# Patient Record
Sex: Female | Born: 1965 | Race: White | Hispanic: No | Marital: Single | Smoking: Current every day smoker
Health system: Southern US, Community
[De-identification: ages and names within clinical notes are randomized; demographics above are authoritative.]

## PROBLEM LIST (undated history)

## (undated) DIAGNOSIS — Z8701 Personal history of pneumonia (recurrent): Secondary | ICD-10-CM

## (undated) DIAGNOSIS — F17200 Nicotine dependence, unspecified, uncomplicated: Secondary | ICD-10-CM

## (undated) DIAGNOSIS — F329 Major depressive disorder, single episode, unspecified: Secondary | ICD-10-CM

## (undated) DIAGNOSIS — M1990 Unspecified osteoarthritis, unspecified site: Secondary | ICD-10-CM

## (undated) DIAGNOSIS — R0600 Dyspnea, unspecified: Secondary | ICD-10-CM

## (undated) DIAGNOSIS — Z8619 Personal history of other infectious and parasitic diseases: Secondary | ICD-10-CM

## (undated) DIAGNOSIS — F32A Depression, unspecified: Secondary | ICD-10-CM

## (undated) DIAGNOSIS — J189 Pneumonia, unspecified organism: Secondary | ICD-10-CM

## (undated) DIAGNOSIS — F419 Anxiety disorder, unspecified: Secondary | ICD-10-CM

## (undated) DIAGNOSIS — R87610 Atypical squamous cells of undetermined significance on cytologic smear of cervix (ASC-US): Secondary | ICD-10-CM

## (undated) DIAGNOSIS — K219 Gastro-esophageal reflux disease without esophagitis: Secondary | ICD-10-CM

## (undated) DIAGNOSIS — K7010 Alcoholic hepatitis without ascites: Secondary | ICD-10-CM

## (undated) DIAGNOSIS — J449 Chronic obstructive pulmonary disease, unspecified: Secondary | ICD-10-CM

## (undated) DIAGNOSIS — C801 Malignant (primary) neoplasm, unspecified: Secondary | ICD-10-CM

## (undated) DIAGNOSIS — B1920 Unspecified viral hepatitis C without hepatic coma: Secondary | ICD-10-CM

## (undated) DIAGNOSIS — J439 Emphysema, unspecified: Secondary | ICD-10-CM

## (undated) DIAGNOSIS — F319 Bipolar disorder, unspecified: Secondary | ICD-10-CM

## (undated) HISTORY — DX: Personal history of pneumonia (recurrent): Z87.01

## (undated) HISTORY — DX: Personal history of other infectious and parasitic diseases: Z86.19

## (undated) HISTORY — PX: EYE SURGERY: SHX253

## (undated) HISTORY — DX: Atypical squamous cells of undetermined significance on cytologic smear of cervix (ASC-US): R87.610

## (undated) HISTORY — DX: Chronic obstructive pulmonary disease, unspecified: J44.9

## (undated) HISTORY — PX: LOBECTOMY: SHX5089

## (undated) HISTORY — DX: Emphysema, unspecified: J43.9

## (undated) HISTORY — PX: TONSILLECTOMY: SUR1361

## (undated) HISTORY — DX: Unspecified viral hepatitis C without hepatic coma: B19.20

## (undated) HISTORY — DX: Nicotine dependence, unspecified, uncomplicated: F17.200

## (undated) HISTORY — DX: Alcoholic hepatitis without ascites: K70.10

---
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
## 2003-06-05 ENCOUNTER — Emergency Department (HOSPITAL_COMMUNITY): Admission: EM | Admit: 2003-06-05 | Discharge: 2003-06-05 | Payer: Self-pay | Admitting: *Deleted

## 2015-02-11 ENCOUNTER — Encounter: Payer: Self-pay | Admitting: Hematology

## 2015-08-11 ENCOUNTER — Encounter: Payer: Self-pay | Admitting: Hematology

## 2015-08-16 ENCOUNTER — Encounter: Payer: Self-pay | Admitting: Hematology

## 2016-10-16 DIAGNOSIS — Z139 Encounter for screening, unspecified: Secondary | ICD-10-CM

## 2016-10-16 LAB — GLUCOSE, POCT (MANUAL RESULT ENTRY): POC Glucose: 111 mg/dL — AB (ref 70–99)

## 2016-10-16 NOTE — Congregational Nurse Program (Signed)
Congregational Nurse Program Note  Date of Encounter: 10/16/2016  Past Medical History: No past medical history on file.  Encounter Details:     CNP Questionnaire - 10/16/16 1115      Patient Demographics   Is this a new or existing patient? New   Patient is considered a/an Not Applicable   Race Caucasian/White     Patient Assistance   Location of Patient [place]   Patient's financial/insurance status Low Income;Self-Pay (Uninsured)   Uninsured Patient ([place]) Yes   Interventions Counseled to make appt. with provider;Assisted patient in making appt.   Patient referred to apply for the following financial assistance [place] insecurities addressed Not Applicable   Transportation assistance No   Assistance securing medications No   Educational health offerings Behavioral health;Chronic disease;Navigating the healthcare system     Encounter Details   Primary purpose of visit Chronic Illness/Condition Visit;Navigating the Healthcare System   Was an Emergency Department visit averted? Not Applicable   Does patient have a medical provider? No   Patient referred to Area Agency;Clinic;Establish PCP   Was a mental health screening completed? (GAINS tool) No   Does patient have dental issues? No   Does patient have vision issues? No   Does your patient have an abnormal blood pressure today? No   Since previous encounter, have you referred patient for abnormal blood pressure that resulted in a new diagnosis or medication change? No   Does your patient have an abnormal blood glucose today? No   Since previous encounter, have you referred patient for abnormal blood glucose that resulted in a new diagnosis or medication change? No   Was there a life-saving intervention made? No     New Client to [employer]. Lives with her father and has no income and no insurance or medicaid. Client states she has been denied medicaid and is  applying for disability.   Very difficult historian and client anxious to "hurry up" due to her father waiting in the car for her.  Past Medical: Anxiety  Depression Schizophrenia Hepatitis C Asthma COPD Emphysema Seizures CVA? 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 Anxious to leave due to her father waiting who drove her. Client is a current patient at [facility] for her mental health treatment and last saw Dr. Hoyle Barr 10/09/16. She states she goes to group sessions. Denies any current pain. Client very anxious to get done and not wanting to take time to answer many questions. Heart sounds normal, pulse regular, lungs clear bilaterally. Skin does appear icteric. States she has been told she has Hep C , but she has never received treatment and "i want to know if I'm going to die" inquires about being able to get treated. RN discussed that she would be referred first into a primary care provider and they would direct her care to any specialist if needed. Client showed me [facility] discount application that she has filled out she states was given to her at the [facility] along  with my contact information. RN inquired if she has the paperwork needed such as income tax( didn't file), proof of residency, and a letter of support from her father that is notarized, etc. She states she hasn't gotten all of that yet. Encouraged client to do that before turning it in to the hospital. Explained to client why that application is important.  Client states she understands. Client denies thoughts of suicide and has another appointment with Dr Hoyle Barr in October 2018. She states her last medical appointment was in April in [place]. She again states she is almost out of her heart pill.  Encouraged client to take her medications even if bottles empty to her appointment.  Choices for medical care discussed and [facility] to [facility] to where she lives. Client request calling the free clinic to see when she can get an appointment and then she will have to ask her father and will call me back to let me know if she can come. Tentatively scheduled client for 10/23/16 at 1:15 pm. RN contact information given to client so she can call back today to inform RN if she can get to that appointment.  Will follow as needed.

## 2016-10-17 ENCOUNTER — Telehealth: Payer: Self-pay

## 2016-10-17 NOTE — Telephone Encounter (Signed)
Client had been seen earlier 10/16/16 at [facility] and client had tentatively been referred into the free clinic on 10/23/16. Client was to call to confirm. RN had not heard from client so called to confirm that she would indeed take that appointment at the [facility]. Client states "its too much trouble to be seen and get paperwork done to be seen for my Hep C, so I'm going back to [place] to be seen and treated". RN again confirmed that client no longer wanted to go to [facility] and client again states she is going back to [place] to be seen medically.  RN will call [facility] and cancel that appointment. Free clinic called and appointment canceled , spoke with Vincente Liberty.

## 2016-10-23 ENCOUNTER — Ambulatory Visit: Payer: Self-pay | Admitting: Physician Assistant

## 2017-02-01 ENCOUNTER — Encounter: Payer: Self-pay | Admitting: Hematology

## 2017-05-07 ENCOUNTER — Encounter: Payer: Self-pay | Admitting: Hematology

## 2017-05-24 ENCOUNTER — Encounter (HOSPITAL_COMMUNITY): Payer: Self-pay | Admitting: Hematology

## 2017-05-25 ENCOUNTER — Inpatient Hospital Stay (HOSPITAL_COMMUNITY): Payer: Medicaid Other | Attending: Hematology | Admitting: Hematology

## 2017-05-25 ENCOUNTER — Inpatient Hospital Stay (HOSPITAL_COMMUNITY): Payer: Medicaid Other

## 2017-05-25 ENCOUNTER — Encounter (HOSPITAL_COMMUNITY): Payer: Self-pay | Admitting: Hematology

## 2017-05-25 ENCOUNTER — Other Ambulatory Visit: Payer: Self-pay

## 2017-05-25 VITALS — BP 108/62 | HR 70 | Temp 98.2°F | Resp 18 | Wt 156.4 lb

## 2017-05-25 DIAGNOSIS — B1920 Unspecified viral hepatitis C without hepatic coma: Secondary | ICD-10-CM | POA: Diagnosis not present

## 2017-05-25 DIAGNOSIS — Z8701 Personal history of pneumonia (recurrent): Secondary | ICD-10-CM | POA: Diagnosis not present

## 2017-05-25 DIAGNOSIS — F3160 Bipolar disorder, current episode mixed, unspecified: Secondary | ICD-10-CM | POA: Diagnosis not present

## 2017-05-25 DIAGNOSIS — J449 Chronic obstructive pulmonary disease, unspecified: Secondary | ICD-10-CM | POA: Insufficient documentation

## 2017-05-25 DIAGNOSIS — K7010 Alcoholic hepatitis without ascites: Secondary | ICD-10-CM | POA: Diagnosis not present

## 2017-05-25 DIAGNOSIS — E039 Hypothyroidism, unspecified: Secondary | ICD-10-CM | POA: Insufficient documentation

## 2017-05-25 DIAGNOSIS — F1020 Alcohol dependence, uncomplicated: Secondary | ICD-10-CM | POA: Insufficient documentation

## 2017-05-25 DIAGNOSIS — D696 Thrombocytopenia, unspecified: Secondary | ICD-10-CM | POA: Insufficient documentation

## 2017-05-25 LAB — CBC WITH DIFFERENTIAL/PLATELET
Basophils Absolute: 0 10*3/uL (ref 0.0–0.1)
Basophils Relative: 0 %
Eosinophils Absolute: 0.1 10*3/uL (ref 0.0–0.7)
Eosinophils Relative: 2 %
HEMATOCRIT: 34.3 % — AB (ref 36.0–46.0)
Hemoglobin: 11.6 g/dL — ABNORMAL LOW (ref 12.0–15.0)
LYMPHS ABS: 1.8 10*3/uL (ref 0.7–4.0)
LYMPHS PCT: 46 %
MCH: 33.7 pg (ref 26.0–34.0)
MCHC: 33.8 g/dL (ref 30.0–36.0)
MCV: 99.7 fL (ref 78.0–100.0)
MONO ABS: 0.7 10*3/uL (ref 0.1–1.0)
Monocytes Relative: 18 %
NEUTROS ABS: 1.3 10*3/uL — AB (ref 1.7–7.7)
Neutrophils Relative %: 34 %
PLATELETS: 33 10*3/uL — AB (ref 150–400)
RBC: 3.44 MIL/uL — AB (ref 3.87–5.11)
RDW: 14.8 % (ref 11.5–15.5)
WBC: 3.9 10*3/uL — ABNORMAL LOW (ref 4.0–10.5)

## 2017-05-25 LAB — RAPID HIV SCREEN (HIV 1/2 AB+AG)
HIV 1/2 ANTIBODIES: NONREACTIVE
HIV-1 P24 Antigen - HIV24: NONREACTIVE

## 2017-05-25 LAB — FOLATE: Folate: 13.7 ng/mL (ref >5.9)

## 2017-05-25 LAB — LACTATE DEHYDROGENASE: LDH: 153 U/L (ref 98–192)

## 2017-05-25 LAB — VITAMIN B12: VITAMIN B 12: 996 pg/mL — AB (ref 180–914)

## 2017-05-25 NOTE — Assessment & Plan Note (Signed)
1.  Thrombocytopenia: -Recent blood work at Dr. Juel Burrow office from April 2019 shows platelet count of 49.  I have reviewed medical records from [place], her platelet count was 02/26/2015.  She has slight easy bruising but denies any active bleeding.  She reports that she was given platelet transfusion when she was in [place] hospital for pneumonia in February.  Again denies any active bleeding during that hospitalization.  Likely etiology for her thrombocytopenia is active hepatitis C which can cause thrombocytopenia by direct bone marrow suppression of megakaryocytes and by causing splenomegaly from liver disease.  I have reviewed CT scans from 2017 from [place] which shows spleen is in the upper limit of normal size along with fatty liver and hepatomegaly.  We will obtain an ultrasound to document the size of spleen.  We will repeat her platelet count today, review smear.  We will complete work-up for thrombocytopenia by checking for connective tissue disorders, HIV test and evaluate nutritional deficiency by checking J15 and folic acid.  We will see her back in 2 to 3 weeks to discuss the results.  2.  Recurrent pneumonias: She reports hospitalization with recurrent pneumonias in the last 3 years.  Hence we will check quantitative immunoglobulins.

## 2017-05-25 NOTE — Patient Instructions (Addendum)
[place] at [facility] Discharge Instructions  Today you saw Dr. Marcia Brash will have labs today.   Ultrasound to be scheduled   Return to see Dr. Delton Coombes in 2 weeks   Thank you for choosing [facility] at [facility] to provide your oncology and hematology care.  To afford each patient quality time with our provider, please arrive at least 15 minutes before your scheduled appointment time.   If you have a lab appointment with the [facility] please come in thru the  Main Entrance and check in at the main information desk  You need to re-schedule your appointment should you arrive 10 or more minutes late.  We strive to give you quality time with our providers, and arriving late affects you and other patients whose appointments are after yours.  Also, if you no show three or more times for appointments you may be dismissed from the clinic at the providers discretion.     Again, thank you for choosing [facility].  Our hope is that these requests will decrease the amount of time that you wait before being seen by our physicians.       _____________________________________________________________  Should you have questions after your visit to [facility], please contact our office at [phone] between the hours of 8:30 a.m. and 4:30 p.m.  Voicemails left after 4:30 p.m. will not be returned until the following business day.  For prescription refill requests, have your pharmacy contact our office.       Resources For Cancer Patients and their Caregivers ? American Cancer Society: Can assist with transportation, wigs, general needs, runs Look Good Feel Better.        661-699-0687 ? Cancer Care: Provides financial assistance, online support groups, medication/co-pay assistance.  1-800-813-HOPE 4127313460) ? [place] Assists [place] cancer patients and their families  through emotional , educational and financial support.  920-155-4776 ? [place] DSS Where to apply for food stamps, Medicaid and utility assistance. (313)818-4093 ? [facility]: Transportation to medical appointments. (434) 737-0044 ? Social Security Administration: May apply for disability if have a Stage IV cancer. 775-162-9775 (867) 825-9132 ? [employer], Disability and Transit Services: Assists with nutrition, care and transit needs. [place] Support Programs:   > Cancer Support Group  2nd Tuesday of the month 1pm-2pm, Journey Room   > Creative Journey  3rd Tuesday of the month 1130am-1pm, Journey Room

## 2017-05-25 NOTE — Progress Notes (Signed)
[facility] CONSULT NOTE  Patient Care Team: Celene Squibb, MD as PCP - General (Internal Medicine)  CHIEF COMPLAINTS/PURPOSE OF CONSULTATION:  Thrombocytopenia   HISTORY OF PRESENTING ILLNESS:  Rachel Gross 52 y.o. female is here because of thrombocytopenia, referred by her PCP, Dr. Delphina Cahill.  Patient recently moved from [place], [place] where she was homeless and living in a tent.  Currently lives with her father in [place] at this time.   Platelets noted to be 49,000 recent office visit with 05/07/17.  AST/ALT also elevated at that time at 183 and 87.   Past medical history is significant for asthma, hypothyroidism, COPD, emphysema, pneumonia, alcoholism, alcoholic hepatitis, hepatitis C, and bipolar disorder (mixed).  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  Labs from [place], [place] reviewed. Date of labs was 08/11/15 and platelet count 125,000 at that time.   CT abd/pelvis from 08/17/15 noted acute diverticulitis involving descending colon & rectosigmoid colon, fatty infiltration of liver and hepatomegaly, small mount of free fluid adjacent to gallbladder.    INTERVAL HISTORY:  Rachel Gross 52 y.o. female presents today for thrombocytopenia.   Here today with her father.   She has no energy. "I feel tired all the time. I can't do nothing."  She was hospitalized in [place] in February 2019 for pneumonia; she was reportedly hospitalized for 2 weeks.  Reports requiring 1 platelet transfusion in [place] about 3 years ago; she was in the hospital "and my blood was low."  She has been hospitalized 3 different times for pneumonia in the past.    Endorses easy bleeding. Denies nose bleeds, blood in stools or urine.  Denies headaches or fever changes. Denies hematemesis or rectal bleeding. Reports good appetite. Denies N&V, constipation, and diarrhea.    Denies family history of  blood disorders that they are aware of. Denies any family history of cancer.    She is aware that she has hepatitis C; states that she has not had treatment for it.  She is not aware of anyone telling her that her spleen has ever been enlarged.    Denies any current alcohol use; last drink was ~3 weeks ago per her father.  There is a bit of discrepancy with when she moved to [facility] and when her last alcoholic drink was.  Patient states that she has not had anything to drink since she moved to [place] a few months ago. Patient states that she has been in [place] for longer than 3 weeks.      MEDICAL HISTORY:  Past Medical History:  Diagnosis Date  . Alcoholic hepatitis without ascites   . 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 COPD (chronic obstructive pulmonary disease) ([place])   . Emphysema lung ([place])   . History of HPV infection   . History of pneumonia   . Smoker     SURGICAL HISTORY: History reviewed. No pertinent surgical history.  SOCIAL HISTORY: Social History   Socioeconomic History  . Marital status: Single    Spouse name: Not on file  . Number of children: Not on file  . Years of education: Not on file  . Highest education level: Not on file  Occupational History  . Not on file  Social Needs  . Financial resource strain: Not on file  . Food insecurity:    Worry: Not on file    Inability: Not on file  .  Transportation needs:    Medical: Not on file    Non-medical: Not on file  Tobacco Use  . Smoking status: Not on file  Substance and Sexual Activity  . Alcohol use: Not on file  . Drug use: Not on file  . Sexual activity: Not on file  Lifestyle  . Physical activity:    Days per week: Not on file    Minutes per session: Not on file  . Stress: Not on file  Relationships  . Social connections:    Talks on phone: Not on file    Gets together: Not on file    Attends religious service: Not on file    Active member of club or organization:  Not on file    Attends meetings of clubs or organizations: Not on file    Relationship status: Not on file  . Intimate partner violence:    Fear of current or ex partner: Not on file    Emotionally abused: Not on file    Physically abused: Not on file    Forced sexual activity: Not on file  Other Topics Concern  . Not on file  Social History Narrative  . Not on file    FAMILY HISTORY: Family History  Problem Relation Age of Onset  . Congenital heart disease Mother   . Prostate cancer Brother   . Alzheimer's disease Paternal Grandmother     ALLERGIES:  is allergic to fish allergy.  MEDICATIONS:  Current Outpatient Medications  Medication Sig Dispense Refill  . divalproex (DEPAKOTE) 500 MG DR tablet Take 500 mg by mouth 2 (two) times daily.    Marland Kitchen levothyroxine (SYNTHROID, LEVOTHROID) 25 MCG tablet Take 25 mcg by mouth daily before breakfast.    . metoprolol succinate (TOPROL-XL) 25 MG 24 hr tablet Take 25 mg by mouth daily.    . risperiDONE (RISPERDAL) 2 MG tablet Take 2 mg by mouth at bedtime.     No current facility-administered medications for this visit.     REVIEW OF SYSTEMS:   Constitutional: Denies fevers, chills or abnormal night sweats.  Complains of feeling generalized fatigue. Eyes: Denies blurriness of vision, double vision or watery eyes Ears, nose, mouth, throat, and face: Denies mucositis or sore throat Respiratory: Denies cough, dyspnea or wheezes Cardiovascular: Denies palpitation, chest discomfort or lower extremity swelling Gastrointestinal:  Denies nausea, heartburn or change in bowel habits Skin: Denies abnormal skin rashes Lymphatics: Denies new lymphadenopathy or easy bruising Neurological:Denies numbness, tingling or new weaknesses Behavioral/Psych: Mood is stable, no new changes  All other systems were reviewed with the patient and are negative.  PHYSICAL EXAMINATION: ECOG PERFORMANCE STATUS: 1 - Symptomatic but completely ambulatory  Vitals:    05/25/17 0935  BP: 108/62  Pulse: 70  Resp: 18  Temp: 98.2 F (36.8 C)  SpO2: 98%   Filed Weights   05/25/17 0935  Weight: 156 lb 6.4 oz (70.9 kg)    GENERAL:alert, no distress and comfortable SKIN: skin color, texture, turgor are normal, no rashes or significant lesions EYES: normal, conjunctiva are pink and non-injected, sclera clear OROPHARYNX:no exudate, no erythema and lips, buccal mucosa, and tongue normal.  No ecchymosis. NECK: supple, thyroid normal size, non-tender, without nodularity LYMPH:  no palpable lymphadenopathy in the cervical, axillary or inguinal LUNGS: clear to auscultation and percussion with normal breathing effort HEART: regular rate & rhythm and no murmurs and no lower extremity edema ABDOMEN:abdomen soft, non-tender and normal bowel sounds.  No clearly palpable hepatosplenomegaly although there is  vague tenderness in the right upper quadrant. Musculoskeletal:no cyanosis of digits and no clubbing  PSYCH: alert & oriented x 3 with fluent speech NEURO: no focal motor/sensory deficits  LABORATORY DATA:  I have reviewed the data as listed from Dr. Juel Burrow office.  RADIOGRAPHIC STUDIES: I have reviewed reports from scans from [place] in July 2017.  ASSESSMENT & PLAN:  Thrombocytopenia ([place]) 1.  Thrombocytopenia: -Recent blood work at Dr. Juel Burrow office from April 2019 shows platelet count of 49.  I have reviewed medical records from [place], her platelet count was 02/26/2015.  She has slight easy bruising but denies any active bleeding.  She reports that she was given platelet transfusion when she was in [place] hospital for pneumonia in February.  Again denies any active bleeding during that hospitalization.  Likely etiology for her thrombocytopenia is active hepatitis C which can cause thrombocytopenia by direct bone marrow suppression of megakaryocytes and by causing splenomegaly from liver disease.  I have reviewed CT scans from 2017 from [place] which shows  spleen is in the upper limit of normal size along with fatty liver and hepatomegaly.  We will obtain an ultrasound to document the size of spleen.  We will repeat her platelet count today, review smear.  We will complete work-up for thrombocytopenia by checking for connective tissue disorders, HIV test and evaluate nutritional deficiency by checking C58 and folic acid.  We will see her back in 2 to 3 weeks to discuss the results.  2.  Recurrent pneumonias: She reports hospitalization with recurrent pneumonias in the last 3 years.  Hence we will check quantitative immunoglobulins.     All questions were answered. The patient knows to call the clinic with any problems, questions or concerns.     This note includes documentation from Mike Craze, NP, who was present during this patient's office visit and evaluation.  I have reviewed this note for its completeness and accuracy.  I have edited this note accordingly based on my findings and medical opinion.      Orders placed this encounter:  Orders Placed This Encounter  Procedures  . US Abdomen Complete    Standing Status:   Future    Standing Expiration Date:   05/26/2018    Order Specific Question:   Reason for Exam (SYMPTOM  OR DIAGNOSIS REQUIRED)    Answer:   thrombocytopenia; known h/o hep C    Order Specific Question:   Preferred imaging location?    Answer:   [facility]  . CBC with Differential/Platelet    Standing Status:   Future    Standing Expiration Date:   05/26/2018  . Lactate dehydrogenase    Standing Status:   Future    Standing Expiration Date:   05/26/2018  . IgG, IgA, IgM    Standing Status:   Future    Standing Expiration Date:   05/26/2018  . Rapid HIV screen (HIV 1/2 Ab+Ag)    Standing Status:   Future    Standing Expiration Date:   05/26/2018  . Lupus anticoagulant panel    Standing Status:   Future    Standing Expiration Date:   05/26/2018  . Pathologist smear review    Standing Status:   Future     Standing Expiration Date:   05/26/2018  . ANA, IFA (with reflex)    Standing Status:   Future    Standing Expiration Date:   05/26/2018  . Folate    Standing Status:   Future  Standing Expiration Date:   05/25/2018  . Vitamin B12    Standing Status:   Future    Standing Expiration Date:   05/26/2018      Derek Jack, MD 05/25/17 10:25 AM

## 2017-05-25 NOTE — Addendum Note (Signed)
Addended by: Boneta Lucks L on: 05/25/2017 10:38 AM   Modules accepted: Orders

## 2017-05-26 LAB — LUPUS ANTICOAGULANT PANEL
DRVVT: 28.1 s (ref 0.0–47.0)
PTT Lupus Anticoagulant: 35.5 s (ref 0.0–51.9)

## 2017-05-26 LAB — IGG, IGA, IGM
IGG (IMMUNOGLOBIN G), SERUM: 2114 mg/dL — AB (ref 700–1600)
IGM (IMMUNOGLOBULIN M), SRM: 367 mg/dL — AB (ref 26–217)
IgA: 403 mg/dL — ABNORMAL HIGH (ref 87–352)

## 2017-05-28 LAB — PATHOLOGIST SMEAR REVIEW

## 2017-05-29 LAB — ANTINUCLEAR ANTIBODIES, IFA: ANTINUCLEAR ANTIBODIES, IFA: NEGATIVE

## 2017-05-31 ENCOUNTER — Ambulatory Visit (HOSPITAL_COMMUNITY)
Admission: RE | Admit: 2017-05-31 | Discharge: 2017-05-31 | Disposition: A | Discharge status: Home / Self Care | Payer: Medicaid Other | Source: Ambulatory Visit | Attending: Adult Health | Admitting: Adult Health

## 2017-05-31 DIAGNOSIS — D696 Thrombocytopenia, unspecified: Secondary | ICD-10-CM | POA: Diagnosis present

## 2017-05-31 DIAGNOSIS — R932 Abnormal findings on diagnostic imaging of liver and biliary tract: Secondary | ICD-10-CM | POA: Diagnosis not present

## 2017-06-12 NOTE — Progress Notes (Signed)
[place] FOLLOW-UP NOTE  Patient Care Team: Celene Squibb, MD as PCP - General (Internal Medicine)  CHIEF COMPLAINTS:  Thrombocytopenia   HISTORY OF PRESENTING ILLNESS:  Rachel Gross 52 y.o. female is here because of thrombocytopenia, referred by her PCP, Dr. Delphina Cahill.  Patient recently moved from [place], [place] where she was homeless and living in a tent.  Currently lives with her father in [place] at this time.   Platelets noted to be 49,000 recent office visit with 05/07/17.  AST/ALT also elevated at that time at 183 and 87.   Past medical history is significant for asthma, hypothyroidism, COPD, emphysema, pneumonia, alcoholism, alcoholic hepatitis, hepatitis C, and bipolar disorder (mixed).  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  Labs from [place], [place] reviewed. Date of labs was 08/11/15 and platelet count 125,000 at that time.   CT abd/pelvis from 08/17/15 noted acute diverticulitis involving descending colon & rectosigmoid colon, fatty infiltration of liver and hepatomegaly, small mount of free fluid adjacent to gallbladder.    INTERVAL HISTORY:  Rachel Gross 52 y.o. female presents today for follow-up for thrombocytopenia.    Nursing staff note that she has bilateral tremors, was drooling, and had some ataxia when being roomed in exam room this morning.   Here today with her father.   Reports feeling tired all the time. Denies any easy bruising or bleeding.  She feels dizzy. States that she has swelling in her ankles.  Her hand tremors are chronic and stable per her report.    Labs reviewed in detail. US abdomen reviewed and spleen was normal.  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  Most recent platelet count 33,000 from labs on 05/25/17.   Recommend 4-days of high-dose Decadron and return in 2 weeks to recheck labs.       MEDICAL HISTORY:  Past Medical  History:  Diagnosis Date  . Alcoholic hepatitis without ascites   . 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 COPD (chronic obstructive pulmonary disease) ([facility])   . Emphysema lung ([facility])   . History of HPV infection   . History of pneumonia   . Smoker     SURGICAL HISTORY: History reviewed. No pertinent surgical history.  SOCIAL HISTORY: Social History   Socioeconomic History  . Marital status: Single    Spouse name: Not on file  . Number of children: Not on file  . Years of education: Not on file  . Highest education level: Not on file  Occupational History  . Not on file  Social Needs  . Financial resource strain: Not on file  . Food insecurity:    Worry: Not on file    Inability: Not on file  . Transportation needs:    Medical: Not on file    Non-medical: Not on file  Tobacco Use  . Smoking status: Not on file  Substance and Sexual Activity  . Alcohol use: Not on file  . Drug use: Not on file  . Sexual activity: Not on file  Lifestyle  . Physical activity:    Days per week: Not on file    Minutes per session: Not on file  . Stress: Not on file  Relationships  . Social connections:    Talks on phone: Not on file    Gets together: Not on file    Attends religious service: Not on file    Active member  of club or organization: Not on file    Attends meetings of clubs or organizations: Not on file    Relationship status: Not on file  . Intimate partner violence:    Fear of current or ex partner: Not on file    Emotionally abused: Not on file    Physically abused: Not on file    Forced sexual activity: Not on file  Other Topics Concern  . Not on file  Social History Narrative  . Not on file    FAMILY HISTORY: Family History  Problem Relation Age of Onset  . Congenital heart disease Mother   . Prostate cancer Brother   . Alzheimer's disease Paternal Grandmother     ALLERGIES:  is allergic to fish allergy.  MEDICATIONS:   Current Outpatient Medications  Medication Sig Dispense Refill  . divalproex (DEPAKOTE) 500 MG DR tablet Take 500 mg by mouth 2 (two) times daily.    Marland Kitchen levothyroxine (SYNTHROID, LEVOTHROID) 25 MCG tablet Take 25 mcg by mouth daily before breakfast.    . metoprolol succinate (TOPROL-XL) 25 MG 24 hr tablet Take 25 mg by mouth daily.    . risperiDONE (RISPERDAL) 2 MG tablet Take 2 mg by mouth at bedtime.    Marland Kitchen dexamethasone (DECADRON) 4 MG tablet Take 10 tablets (40 mg total) by mouth every morning for 4 days. 40 tablet 0   No current facility-administered medications for this visit.     REVIEW OF SYSTEMS:   Constitutional: Denies fevers, chills or abnormal night sweats.  Complains of feeling generalized fatigue. Eyes: Denies blurriness of vision, double vision or watery eyes Ears, nose, mouth, throat, and face: Denies mucositis or sore throat Respiratory: Denies  dyspnea or wheezes.  Occasional cough. Cardiovascular: Denies palpitation, chest discomfort or lower extremity swelling Gastrointestinal:  Denies nausea, heartburn or change in bowel habits Skin: Denies abnormal skin rashes Lymphatics: Denies new lymphadenopathy or easy bruising Neurological:Denies numbness, tingling or new weaknesses Behavioral/Psych: Mood is stable, no new changes  All other systems were reviewed with the patient and are negative.  PHYSICAL EXAMINATION: ECOG PERFORMANCE STATUS: 1 - Symptomatic but completely ambulatory  Vitals:   06/13/17 0937  BP: (!) 101/41  Pulse: 66  Resp: 16  SpO2: 99%   Filed Weights   06/13/17 0937  Weight: 153 lb (69.4 kg)    GENERAL:alert, no distress and comfortable SKIN: skin color, texture, turgor are normal, no rashes or significant lesions   LABORATORY DATA:  I have reviewed the data as listed from Dr. Juel Burrow office.  RADIOGRAPHIC STUDIES: I have reviewed reports from scans from [place] in July 2017.  ASSESSMENT & PLAN:   .  Thrombocytopenia: -Recent blood  work at Dr. Juel Burrow office from April 2019 shows platelet count of 49.  I have reviewed medical records from [place], her platelet count was 127.  We have done work-up for thrombocytopenia.  Her platelet count dropped to 33.  Lupus anticoagulant and HIV test were negative. -Ultrasound of the abdomen on 05/31/2017 shows hepatic cirrhosis, normal sized spleen. 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  We will give her a trial of pulse dose dexamethasone 40 mg daily for 4 days.  I plan to check her platelet count in 10 to 14 days.  We discussed the side effects of high-dose steroids.  2.  Recurrent pneumonias: She reports hospitalization with recurrent pneumonias in the last 3 years.  I have checked her immunoglobulins which are normal.    All questions were answered. The patient  knows to call the clinic with any problems, questions or concerns.     This note includes documentation from Mike Craze, NP, who was present during this patient's office visit and evaluation.  I have reviewed this note for its completeness and accuracy.  I have edited this note accordingly based on my findings and medical opinion.      Orders placed this encounter:  Orders Placed This Encounter  Procedures  . CBC with Differential/Platelet    Standing Status:   Future    Standing Expiration Date:   06/14/2018      Derek Jack, MD 06/13/17 3:01 PM

## 2017-06-13 ENCOUNTER — Inpatient Hospital Stay (HOSPITAL_COMMUNITY): Payer: Medicaid Other | Attending: Hematology | Admitting: Hematology

## 2017-06-13 ENCOUNTER — Other Ambulatory Visit: Payer: Self-pay

## 2017-06-13 ENCOUNTER — Encounter (HOSPITAL_COMMUNITY): Payer: Self-pay | Admitting: Hematology

## 2017-06-13 VITALS — BP 101/41 | HR 66 | Resp 16 | Wt 153.0 lb

## 2017-06-13 DIAGNOSIS — E039 Hypothyroidism, unspecified: Secondary | ICD-10-CM | POA: Diagnosis not present

## 2017-06-13 DIAGNOSIS — K7460 Unspecified cirrhosis of liver: Secondary | ICD-10-CM | POA: Insufficient documentation

## 2017-06-13 DIAGNOSIS — D693 Immune thrombocytopenic purpura: Secondary | ICD-10-CM | POA: Diagnosis not present

## 2017-06-13 DIAGNOSIS — D696 Thrombocytopenia, unspecified: Secondary | ICD-10-CM

## 2017-06-13 MED ORDER — DEXAMETHASONE 4 MG PO TABS: 40.0000 mg | ORAL_TABLET | Freq: Every morning | ORAL | 0 refills | Status: AC

## 2017-06-13 NOTE — Assessment & Plan Note (Signed)
1.  Thrombocytopenia: -Recent blood work at Dr. Juel Burrow office from April 2019 shows platelet count of 49.  I have reviewed medical records from [place], her platelet count was 02/26/2015.  She has slight easy bruising but denies any active bleeding.  She reports that she was given platelet transfusion when she was in [place] hospital for pneumonia in February.  Again denies any active bleeding during that hospitalization.  Likely etiology for her thrombocytopenia is active hepatitis C which can cause thrombocytopenia by direct bone marrow suppression of megakaryocytes and by causing splenomegaly from liver disease.  I have reviewed CT scans from 2017 from [place] which shows spleen is in the upper limit of normal size along with fatty liver and hepatomegaly.  We will obtain an ultrasound to document the size of spleen.  We will repeat her platelet count today, review smear.  We will complete work-up for thrombocytopenia by checking for connective tissue disorders, HIV test and evaluate nutritional deficiency by checking P94 and folic acid.  We will see her back in 2 to 3 weeks to discuss the results.  2.  Recurrent pneumonias: She reports hospitalization with recurrent pneumonias in the last 3 years.  Hence we will check quantitative immunoglobulins.

## 2017-06-13 NOTE — Assessment & Plan Note (Signed)
1.  Thrombocytopenia: -Recent blood work at Dr. Juel Burrow office from April 2019 shows platelet count of 49.  I have reviewed medical records from [place], her platelet count was 127.  We have done work-up for thrombocytopenia.  Her platelet count dropped to 33.  Lupus anticoagulant and HIV test were negative. -Ultrasound of the abdomen on 05/31/2017 shows hepatic cirrhosis, normal sized spleen. 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  We will give her a trial of pulse dose dexamethasone 40 mg daily for 4 days.  I plan to check her platelet count in 10 to 14 days.  We discussed the side effects of high-dose steroids.  2.  Recurrent pneumonias: She reports hospitalization with recurrent pneumonias in the last 3 years.  I have checked her immunoglobulins which are normal.  3.  Hepatitis C: We will make a referral to hepatology for treatment of hep C.

## 2017-06-20 ENCOUNTER — Encounter: Payer: Self-pay | Admitting: Internal Medicine

## 2017-06-27 ENCOUNTER — Inpatient Hospital Stay (HOSPITAL_COMMUNITY): Payer: Medicaid Other

## 2017-06-27 DIAGNOSIS — D696 Thrombocytopenia, unspecified: Secondary | ICD-10-CM

## 2017-06-27 DIAGNOSIS — D693 Immune thrombocytopenic purpura: Secondary | ICD-10-CM | POA: Diagnosis not present

## 2017-06-27 LAB — CBC WITH DIFFERENTIAL/PLATELET
BASOS ABS: 0 10*3/uL (ref 0.0–0.1)
BASOS PCT: 0 %
EOS ABS: 0 10*3/uL (ref 0.0–0.7)
Eosinophils Relative: 1 %
HCT: 39.1 % (ref 36.0–46.0)
HEMOGLOBIN: 13 g/dL (ref 12.0–15.0)
Lymphocytes Relative: 42 %
Lymphs Abs: 2.2 10*3/uL (ref 0.7–4.0)
MCH: 34.4 pg — ABNORMAL HIGH (ref 26.0–34.0)
MCHC: 33.2 g/dL (ref 30.0–36.0)
MCV: 103.4 fL — ABNORMAL HIGH (ref 78.0–100.0)
MONOS PCT: 15 %
Monocytes Absolute: 0.7 10*3/uL (ref 0.1–1.0)
NEUTROS ABS: 2.1 10*3/uL (ref 1.7–7.7)
NEUTROS PCT: 42 %
Platelets: 81 10*3/uL — ABNORMAL LOW (ref 150–400)
RBC: 3.78 MIL/uL — ABNORMAL LOW (ref 3.87–5.11)
RDW: 15.8 % — AB (ref 11.5–15.5)
WBC: 5 10*3/uL (ref 4.0–10.5)

## 2017-06-28 ENCOUNTER — Encounter (HOSPITAL_COMMUNITY): Payer: Self-pay | Admitting: Hematology

## 2017-06-28 ENCOUNTER — Inpatient Hospital Stay (HOSPITAL_BASED_OUTPATIENT_CLINIC_OR_DEPARTMENT_OTHER): Payer: Medicaid Other | Admitting: Hematology

## 2017-06-28 ENCOUNTER — Other Ambulatory Visit: Payer: Self-pay

## 2017-06-28 VITALS — BP 135/103 | HR 80 | Temp 98.6°F | Resp 18 | Wt 153.1 lb

## 2017-06-28 DIAGNOSIS — K7460 Unspecified cirrhosis of liver: Secondary | ICD-10-CM

## 2017-06-28 DIAGNOSIS — D693 Immune thrombocytopenic purpura: Secondary | ICD-10-CM

## 2017-06-28 DIAGNOSIS — E039 Hypothyroidism, unspecified: Secondary | ICD-10-CM | POA: Diagnosis not present

## 2017-06-28 DIAGNOSIS — D696 Thrombocytopenia, unspecified: Secondary | ICD-10-CM

## 2017-06-28 NOTE — Assessment & Plan Note (Signed)
1.  Immune thrombocytopenia: -Recent blood work at Dr. Juel Burrow office from April 2019 shows platelet count of 49.  I have reviewed medical records from [place], her platelet count was 127.  We have done work-up for thrombocytopenia.  Her platelet count dropped to 33.  Lupus anticoagulant and HIV test were negative. -Ultrasound of the abdomen on 05/31/2017 shows hepatic cirrhosis, normal sized spleen. - We have given a trial of pulse dexamethasone 40 mg for 4 days, on 06/14/2017.  Patient tolerated dexamethasone very well.  She felt better because of her energy level improvement.  Her platelet count improved to 81 from 33. -I have given an appointment for her to come back in 2 months with repeat blood work.  If her platelet count drops below 30 in the future, we can use dexamethasone for responses.  She was told to come back sooner should she develop any easy bruising or bleeding.    2.  Recurrent pneumonias: She reports hospitalization with recurrent pneumonias in the last 3 years.  I have checked her immunoglobulins which are normal.  3.  Hepatitis C: She has an appointment to see hepatology for treatment of hep C in August.

## 2017-06-28 NOTE — Progress Notes (Signed)
[facility] FOLLOW-UP NOTE  Patient Care Team: Rachel Squibb, MD as PCP - General (Internal Medicine) Rachel Gross, Rachel Estimable, MD as Consulting Physician (Gastroenterology)  CHIEF COMPLAINTS:  Thrombocytopenia   HISTORY OF PRESENTING ILLNESS:  Rachel Gross 52 y.o. female is here because of thrombocytopenia, referred by her PCP, Dr. Delphina Gross.  Patient recently moved from [place], [place] where she was homeless and living in a tent.  Currently lives with her father in [place] at this time.   Platelets noted to be 49,000 recent office visit with 05/07/17.  AST/ALT also elevated at that time at 183 and 87.   Past medical history is significant for asthma, hypothyroidism, COPD, emphysema, pneumonia, alcoholism, alcoholic hepatitis, hepatitis C, and bipolar disorder (mixed).  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  Labs from [place], [place] reviewed. Date of labs was 08/11/15 and platelet count 125,000 at that time.   CT abd/pelvis from 08/17/15 noted acute diverticulitis involving descending colon & rectosigmoid colon, fatty infiltration of liver and hepatomegaly, small mount of free fluid adjacent to gallbladder.    INTERVAL HISTORY:  Rachel Gross 52 y.o. female presents today for follow-up for thrombocytopenia.    I have given dexamethasone at last visit.  She took for 4 days starting on 06/14/2017.  She felt very better in terms of improved energy levels.  She still continues to have some increased energy levels.  Denies any bleeding or easy bruising.  Denies any fevers or infections.  Denies any recent ER visits or hospitalizations.    MEDICAL HISTORY:  Past Medical History:  Diagnosis Date  . Alcoholic hepatitis without ascites   . 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 COPD (chronic obstructive pulmonary disease) ([facility])   . Emphysema lung ([facility])   .  History of HPV infection   . History of pneumonia   . Smoker     SURGICAL HISTORY: History reviewed. No pertinent surgical history.  SOCIAL HISTORY: Social History   Socioeconomic History  . Marital status: Single    Spouse name: Not on file  . Number of children: Not on file  . Years of education: Not on file  . Highest education level: Not on file  Occupational History  . Not on file  Social Needs  . Financial resource strain: Not on file  . Food insecurity:    Worry: Not on file    Inability: Not on file  . Transportation needs:    Medical: Not on file    Non-medical: Not on file  Tobacco Use  . Smoking status: Current Some Day Smoker    Packs/day: 0.50    Types: Cigarettes  . Smokeless tobacco: Never Used  Substance and Sexual Activity  . Alcohol use: Not Currently  . Drug use: Not Currently  . Sexual activity: Not on file  Lifestyle  . Physical activity:    Days per week: Not on file    Minutes per session: Not on file  . Stress: Not on file  Relationships  . Social connections:    Talks on phone: Not on file    Gets together: Not on file    Attends religious service: Not on file    Active member of club or organization: Not on file    Attends meetings of clubs or organizations: Not on file    Relationship status: Not on file  . Intimate partner violence:  Fear of current or ex partner: Not on file    Emotionally abused: Not on file    Physically abused: Not on file    Forced sexual activity: Not on file  Other Topics Concern  . Not on file  Social History Narrative  . Not on file    FAMILY HISTORY: Family History  Problem Relation Age of Onset  . Congenital heart disease Mother   . Prostate cancer Brother   . Alzheimer's disease Paternal Grandmother     ALLERGIES:  is allergic to fish allergy.  MEDICATIONS:  Current Outpatient Medications  Medication Sig Dispense Refill  . divalproex (DEPAKOTE) 500 MG DR tablet Take 500 mg by mouth 2  (two) times daily.    Marland Kitchen levothyroxine (SYNTHROID, LEVOTHROID) 25 MCG tablet Take 25 mcg by mouth daily before breakfast.    . metoprolol succinate (TOPROL-XL) 25 MG 24 hr tablet Take 25 mg by mouth daily.    . risperiDONE (RISPERDAL) 2 MG tablet Take 2 mg by mouth at bedtime.     No current facility-administered medications for this visit.     REVIEW OF SYSTEMS:   Constitutional: Denies fevers, chills or abnormal night sweats.  Complains of feeling generalized fatigue. Eyes: Denies blurriness of vision, double vision or watery eyes Ears, nose, mouth, throat, and face: Denies mucositis or sore throat Respiratory: Denies  dyspnea or wheezes.  Occasional cough. Cardiovascular: Denies palpitation, chest discomfort or lower extremity swelling Gastrointestinal:  Denies nausea, heartburn or change in bowel habits Skin: Denies abnormal skin rashes Lymphatics: Denies new lymphadenopathy or easy bruising Neurological:Denies numbness, tingling or new weaknesses Behavioral/Psych: Mood is stable, no new changes  All other systems were reviewed with the patient and are negative.  PHYSICAL EXAMINATION: ECOG PERFORMANCE STATUS: 1 - Symptomatic but completely ambulatory  Vitals:   06/28/17 1425  BP: (!) 135/103  Pulse: 80  Resp: 18  Temp: 98.6 F (37 C)  SpO2: 98%   Filed Weights   06/28/17 1425  Weight: 153 lb 1.6 oz (69.4 kg)    GENERAL:alert, no distress and comfortable SKIN: skin color, texture, turgor are normal, no rashes or significant lesions   LABORATORY DATA:  I have reviewed the data as listed from Dr. Juel Gross office.  RADIOGRAPHIC STUDIES: I have reviewed reports from scans from [place] in July 2017.  ASSESSMENT & PLAN:   .  Thrombocytopenia: -Recent blood work at Dr. Juel Gross office from April 2019 shows platelet count of 49.  I have reviewed medical records from [place], her platelet count was 127.  We have done work-up for thrombocytopenia.  Her platelet count dropped to  33.  Lupus anticoagulant and HIV test were negative. -Ultrasound of the abdomen on 05/31/2017 shows hepatic cirrhosis, normal sized spleen. 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  We will give her a trial of pulse dose dexamethasone 40 mg daily for 4 days.  I plan to check her platelet count in 10 to 14 days.  We discussed the side effects of high-dose steroids.  2.  Recurrent pneumonias: She reports hospitalization with recurrent pneumonias in the last 3 years.  I have checked her immunoglobulins which are normal.    All questions were answered. The patient knows to call the clinic with any problems, questions or concerns.     Orders placed this encounter:  Orders Placed This Encounter  Procedures  . CBC with Differential    Standing Status:   Future    Standing Expiration Date:   06/28/2018  .  Lactate dehydrogenase    Standing Status:   Future    Standing Expiration Date:   06/28/2018  . Comprehensive metabolic panel    Standing Status:   Future    Standing Expiration Date:   06/28/2018      Derek Jack, MD 06/28/17 4:12 PM

## 2017-06-28 NOTE — Patient Instructions (Signed)
[facility] at [facility] Discharge Instructions  Today you saw Dr. K.   Thank you for choosing [facility] at [facility] to provide your oncology and hematology care.  To afford each patient quality time with our provider, please arrive at least 15 minutes before your scheduled appointment time.   If you have a lab appointment with the Cancer Center please come in thru the  Main Entrance and check in at the main information desk  You need to re-schedule your appointment should you arrive 10 or more minutes late.  We strive to give you quality time with our providers, and arriving late affects you and other patients whose appointments are after yours.  Also, if you no show three or more times for appointments you may be dismissed from the clinic at the providers discretion.     Again, thank you for choosing [facility].  Our hope is that these requests will decrease the amount of time that you wait before being seen by our physicians.       _____________________________________________________________  Should you have questions after your visit to [facility], please contact our office at [phone] between the hours of 8:30 a.m. and 4:30 p.m.  Voicemails left after 4:30 p.m. will not be returned until the following business day.  For prescription refill requests, have your pharmacy contact our office.       Resources For Cancer Patients and their Caregivers ? American Cancer Society: Can assist with transportation, wigs, general needs, runs Look Good Feel Better.        1-888-227-6333 ? Cancer Care: Provides financial assistance, online support groups, medication/co-pay assistance.  1-800-813-HOPE (4673) ? [facility] Assists [place] cancer patients and their families through emotional , educational and financial support.  336-427-4357 ? [place] DSS Where to apply for food  stamps, Medicaid and utility assistance. 336-342-1394 ? [facility]: Transportation to medical appointments. 336-347-2287 ? Social Security Administration: May apply for disability if have a Stage IV cancer. 336-342-7796 1-800-772-1213 ? [place] Aging, Disability and Transit Services: Assists with nutrition, care and transit needs. 336-349-2343  Cancer Center Support Programs:   > Cancer Support Group  2nd Tuesday of the month 1pm-2pm, Journey Room   > Creative Journey  3rd Tuesday of the month 1130am-1pm, Journey Room    

## 2017-08-06 ENCOUNTER — Inpatient Hospital Stay (HOSPITAL_COMMUNITY)
Admission: EM | Admit: 2017-08-06 | Discharge: 2017-08-10 | DRG: 071 | Disposition: A | Discharge status: Home / Self Care | Payer: Medicaid Other | Attending: Internal Medicine | Admitting: Internal Medicine

## 2017-08-06 ENCOUNTER — Other Ambulatory Visit: Payer: Self-pay

## 2017-08-06 ENCOUNTER — Encounter (HOSPITAL_COMMUNITY): Payer: Self-pay | Admitting: *Deleted

## 2017-08-06 ENCOUNTER — Emergency Department (HOSPITAL_COMMUNITY): Payer: Medicaid Other

## 2017-08-06 DIAGNOSIS — D696 Thrombocytopenia, unspecified: Secondary | ICD-10-CM | POA: Diagnosis present

## 2017-08-06 DIAGNOSIS — G9340 Encephalopathy, unspecified: Secondary | ICD-10-CM | POA: Diagnosis not present

## 2017-08-06 DIAGNOSIS — I10 Essential (primary) hypertension: Secondary | ICD-10-CM

## 2017-08-06 DIAGNOSIS — D693 Immune thrombocytopenic purpura: Secondary | ICD-10-CM | POA: Diagnosis present

## 2017-08-06 DIAGNOSIS — Z79890 Hormone replacement therapy (postmenopausal): Secondary | ICD-10-CM

## 2017-08-06 DIAGNOSIS — Z91013 Allergy to seafood: Secondary | ICD-10-CM

## 2017-08-06 DIAGNOSIS — F17210 Nicotine dependence, cigarettes, uncomplicated: Secondary | ICD-10-CM | POA: Diagnosis present

## 2017-08-06 DIAGNOSIS — Z7141 Alcohol abuse counseling and surveillance of alcoholic: Secondary | ICD-10-CM

## 2017-08-06 DIAGNOSIS — E870 Hyperosmolality and hypernatremia: Secondary | ICD-10-CM

## 2017-08-06 DIAGNOSIS — F319 Bipolar disorder, unspecified: Secondary | ICD-10-CM | POA: Diagnosis present

## 2017-08-06 DIAGNOSIS — Z79899 Other long term (current) drug therapy: Secondary | ICD-10-CM

## 2017-08-06 DIAGNOSIS — Z8701 Personal history of pneumonia (recurrent): Secondary | ICD-10-CM

## 2017-08-06 DIAGNOSIS — N179 Acute kidney failure, unspecified: Secondary | ICD-10-CM | POA: Diagnosis present

## 2017-08-06 DIAGNOSIS — R001 Bradycardia, unspecified: Secondary | ICD-10-CM | POA: Diagnosis present

## 2017-08-06 DIAGNOSIS — E860 Dehydration: Secondary | ICD-10-CM | POA: Diagnosis present

## 2017-08-06 DIAGNOSIS — E7220 Disorder of urea cycle metabolism, unspecified: Secondary | ICD-10-CM | POA: Diagnosis present

## 2017-08-06 DIAGNOSIS — R7401 Elevation of levels of liver transaminase levels: Secondary | ICD-10-CM | POA: Diagnosis present

## 2017-08-06 DIAGNOSIS — Z8249 Family history of ischemic heart disease and other diseases of the circulatory system: Secondary | ICD-10-CM

## 2017-08-06 DIAGNOSIS — G9349 Other encephalopathy: Principal | ICD-10-CM | POA: Diagnosis present

## 2017-08-06 DIAGNOSIS — Z862 Personal history of diseases of the blood and blood-forming organs and certain disorders involving the immune mechanism: Secondary | ICD-10-CM

## 2017-08-06 DIAGNOSIS — R740 Nonspecific elevation of levels of transaminase and lactic acid dehydrogenase [LDH]: Secondary | ICD-10-CM

## 2017-08-06 DIAGNOSIS — Z789 Other specified health status: Secondary | ICD-10-CM

## 2017-08-06 DIAGNOSIS — Z87410 Personal history of cervical dysplasia: Secondary | ICD-10-CM

## 2017-08-06 DIAGNOSIS — F1010 Alcohol abuse, uncomplicated: Secondary | ICD-10-CM | POA: Diagnosis present

## 2017-08-06 DIAGNOSIS — K7010 Alcoholic hepatitis without ascites: Secondary | ICD-10-CM | POA: Diagnosis present

## 2017-08-06 DIAGNOSIS — J449 Chronic obstructive pulmonary disease, unspecified: Secondary | ICD-10-CM | POA: Diagnosis present

## 2017-08-06 LAB — COMPREHENSIVE METABOLIC PANEL
ALT: 136 U/L — ABNORMAL HIGH (ref 0–44)
AST: 311 U/L — AB (ref 15–41)
Albumin: 3.4 g/dL — ABNORMAL LOW (ref 3.5–5.0)
Alkaline Phosphatase: 50 U/L (ref 38–126)
Anion gap: 10 (ref 5–15)
BUN: 30 mg/dL — AB (ref 6–20)
CHLORIDE: 103 mmol/L (ref 98–111)
CO2: 24 mmol/L (ref 22–32)
Calcium: 8.8 mg/dL — ABNORMAL LOW (ref 8.9–10.3)
Creatinine, Ser: 1.73 mg/dL — ABNORMAL HIGH (ref 0.44–1.00)
GFR calc Af Amer: 38 mL/min — ABNORMAL LOW (ref >60)
GFR, EST NON AFRICAN AMERICAN: 33 mL/min — AB (ref >60)
Glucose, Bld: 90 mg/dL (ref 70–99)
POTASSIUM: 4 mmol/L (ref 3.5–5.1)
SODIUM: 137 mmol/L (ref 135–145)
Total Bilirubin: 1 mg/dL (ref 0.3–1.2)
Total Protein: 7.2 g/dL (ref 6.5–8.1)

## 2017-08-06 LAB — URINALYSIS, ROUTINE W REFLEX MICROSCOPIC
BILIRUBIN URINE: NEGATIVE
Glucose, UA: NEGATIVE mg/dL
KETONES UR: NEGATIVE mg/dL
LEUKOCYTES UA: NEGATIVE
NITRITE: NEGATIVE
PROTEIN: 30 mg/dL — AB
Specific Gravity, Urine: 1.018 (ref 1.005–1.030)
pH: 5 (ref 5.0–8.0)

## 2017-08-06 LAB — ACETAMINOPHEN LEVEL: Acetaminophen (Tylenol), Serum: <10 ug/mL — ABNORMAL LOW (ref 10–30)

## 2017-08-06 LAB — RAPID URINE DRUG SCREEN, HOSP PERFORMED
AMPHETAMINES: NOT DETECTED
BENZODIAZEPINES: NOT DETECTED
COCAINE: NOT DETECTED
Opiates: POSITIVE — AB
TETRAHYDROCANNABINOL: NOT DETECTED

## 2017-08-06 LAB — CBC
HCT: 37.1 % (ref 36.0–46.0)
Hemoglobin: 12.4 g/dL (ref 12.0–15.0)
MCH: 35.4 pg — ABNORMAL HIGH (ref 26.0–34.0)
MCHC: 33.4 g/dL (ref 30.0–36.0)
MCV: 106 fL — AB (ref 78.0–100.0)
Platelets: 31 10*3/uL — ABNORMAL LOW (ref 150–400)
RBC: 3.5 MIL/uL — AB (ref 3.87–5.11)
RDW: 13.9 % (ref 11.5–15.5)
WBC: 5.5 10*3/uL (ref 4.0–10.5)

## 2017-08-06 LAB — PROTIME-INR
INR: 1.21
Prothrombin Time: 15.2 seconds (ref 11.4–15.2)

## 2017-08-06 LAB — MAGNESIUM: Magnesium: 2.2 mg/dL (ref 1.7–2.4)

## 2017-08-06 LAB — VALPROIC ACID LEVEL: Valproic Acid Lvl: 126 ug/mL — ABNORMAL HIGH (ref 50.0–100.0)

## 2017-08-06 LAB — DIFFERENTIAL
BASOS PCT: 0 %
Basophils Absolute: 0 10*3/uL (ref 0.0–0.1)
EOS ABS: 0 10*3/uL (ref 0.0–0.7)
Eosinophils Relative: 0 %
LYMPHS PCT: 32 %
Lymphs Abs: 1.8 10*3/uL (ref 0.7–4.0)
Monocytes Absolute: 0.9 10*3/uL (ref 0.1–1.0)
Monocytes Relative: 17 %
NEUTROS ABS: 2.8 10*3/uL (ref 1.7–7.7)
Neutrophils Relative %: 51 %

## 2017-08-06 LAB — MRSA PCR SCREENING: MRSA by PCR: NEGATIVE

## 2017-08-06 LAB — LIPASE, BLOOD: Lipase: 45 U/L (ref 11–51)

## 2017-08-06 LAB — CBG MONITORING, ED
Glucose-Capillary: 78 mg/dL (ref 70–99)
Glucose-Capillary: 112 mg/dL — ABNORMAL HIGH (ref 70–99)

## 2017-08-06 LAB — AMMONIA: AMMONIA: 87 umol/L — AB (ref 9–35)

## 2017-08-06 LAB — ETHANOL: Alcohol, Ethyl (B): <10 mg/dL (ref <10)

## 2017-08-06 LAB — PHOSPHORUS: PHOSPHORUS: 4.9 mg/dL — AB (ref 2.5–4.6)

## 2017-08-06 LAB — SALICYLATE LEVEL

## 2017-08-06 LAB — TSH: TSH: 2.902 u[IU]/mL (ref 0.350–4.500)

## 2017-08-06 MED ORDER — SODIUM CHLORIDE 0.9 % IV BOLUS
1000.0000 mL | Freq: Once | INTRAVENOUS | Status: AC
  Administered 2017-08-06: 1000 mL via INTRAVENOUS

## 2017-08-06 MED ORDER — IPRATROPIUM-ALBUTEROL 0.5-2.5 (3) MG/3ML IN SOLN
3.0000 mL | Freq: Three times a day (TID) | RESPIRATORY_TRACT | Status: DC
Start: 2017-08-06 — End: 2017-08-07
  Administered 2017-08-06 (×3): 3 mL via RESPIRATORY_TRACT
  Filled 2017-08-06 (×2): qty 3

## 2017-08-06 MED ORDER — LACTULOSE ENEMA
300.0000 mL | Freq: Two times a day (BID) | ORAL | Status: DC
  Administered 2017-08-06 – 2017-08-07 (×3): 300 mL via RECTAL
  Filled 2017-08-06 (×5): qty 300

## 2017-08-06 MED ORDER — DEXAMETHASONE SODIUM PHOSPHATE 4 MG/ML IJ SOLN
10.0000 mg | Freq: Four times a day (QID) | INTRAMUSCULAR | Status: AC
  Administered 2017-08-06 – 2017-08-10 (×16): 10 mg via INTRAVENOUS
  Filled 2017-08-06 (×13): qty 3

## 2017-08-06 MED ORDER — ORAL CARE MOUTH RINSE
15.0000 mL | Freq: Two times a day (BID) | OROMUCOSAL | Status: DC
  Administered 2017-08-06 – 2017-08-10 (×9): 15 mL via OROMUCOSAL

## 2017-08-06 MED ORDER — IPRATROPIUM-ALBUTEROL 0.5-2.5 (3) MG/3ML IN SOLN
3.0000 mL | RESPIRATORY_TRACT | Status: DC | PRN
  Filled 2017-08-06: qty 3

## 2017-08-06 MED ORDER — DEXAMETHASONE SODIUM PHOSPHATE 4 MG/ML IJ SOLN: 40.0000 mg | Freq: Every morning | INTRAMUSCULAR | Status: DC

## 2017-08-06 NOTE — Progress Notes (Signed)
Rachel Gross is a 52 y.o. female with medical history significant of alcoholic hepatitis with cystitis, cervical dysplasia, history of HPV infection, history of pneumonia, COPD/emphysema, tobacco use, bipolar disorder who is brought to the emergency department via EMS after her father noticed that she was not waking up after being very somnolent for the past 2 days.  She has been admitted with multifactorial encephalopathy with hyperammonemia related to alcoholic hepatitis and recent narcotic use. She is noted to have history of immune thrombocytopenia for which she sees Dr. Lamonte Richer and has been recommended pulse dose dexamethasone for response-will start this today. Lactulose enemas have been ordered with rectal tube. Will follow am cmp as well as ammonia levels. I have evaluated pt at bedside on multiple occasions and she appears to be protecting her airway and is responsive to loud voice and stimulus. She is on nasal cannula with good pulse ox and stable vitals.

## 2017-08-06 NOTE — ED Provider Notes (Signed)
[facility] Provider Note   CSN: [id number] Arrival date & time: 08/06/17  0442     History   Chief Complaint Chief Complaint  Patient presents with  . Altered Mental Status    HPI Rachel Gross is a 52 y.o. female.  Patient presents to the emergency department by ambulance from home.  Husband provides history because patient is confused.  Husband reports that she has not been acting like her normal self for the last several days, woke up this morning was very altered and confused.  She has had nausea and vomiting.     Past Medical History:  Diagnosis Date  . Alcoholic hepatitis without ascites   . 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 COPD (chronic obstructive pulmonary disease) ([place])   . Emphysema lung ([place])   . History of HPV infection   . History of pneumonia   . Smoker     Patient Active Problem List   Diagnosis Date Noted  . Idiopathic thrombocytopenic purpura (ITP) (HCC) 06/13/2017    Past Surgical History:  Procedure Laterality Date  . CESAREAN SECTION       OB History   None      Home Medications    Prior to Admission medications   Medication Sig Start Date End Date Taking? Authorizing Provider  divalproex (DEPAKOTE) 500 MG DR tablet Take 500 mg by mouth 2 (two) times daily.    [provider]  levothyroxine (SYNTHROID, LEVOTHROID) 25 MCG tablet Take 25 mcg by mouth daily before breakfast.    [provider]  metoprolol succinate (TOPROL-XL) 25 MG 24 hr tablet Take 25 mg by mouth daily.    [provider]  risperiDONE (RISPERDAL) 2 MG tablet Take 2 mg by mouth at bedtime.    [provider]    Family History Family History  Problem Relation Age of Onset  . Congenital heart disease Mother   . Prostate cancer Brother   . Alzheimer's disease Paternal Grandmother     Social History Social History   Tobacco Use  . Smoking status: Current Some Day Smoker   Packs/day: 1.00    Types: Cigarettes  . Smokeless tobacco: Never Used  Substance Use Topics  . Alcohol use: Not Currently  . Drug use: Not Currently     Allergies   Fish allergy   Review of Systems Review of Systems  Unable to perform ROS: Mental status change     Physical Exam Updated Vital Signs BP (!) 96/58   Pulse 78   Temp 98.7 F (37.1 C) (Rectal)   Resp 10   SpO2 100%   Physical Exam  Constitutional: She appears well-developed and well-nourished. No distress.  HENT:  Head: Normocephalic and atraumatic.  Right Ear: Hearing normal.  Left Ear: Hearing normal.  Nose: Nose normal.  Mouth/Throat: Oropharynx is clear and moist and mucous membranes are normal.  Eyes: Pupils are equal, round, and reactive to light. Conjunctivae and EOM are normal.  Neck: Normal range of motion. Neck supple.  Cardiovascular: Regular rhythm, S1 normal and S2 normal. Exam reveals no gallop and no friction rub.  No murmur heard. Pulmonary/Chest: Effort normal and breath sounds normal. No respiratory distress. She exhibits no tenderness.  Abdominal: Soft. Normal appearance and bowel sounds are normal. There is no hepatosplenomegaly. There is no tenderness. There is no rebound, no guarding, no tenderness at McBurney's point and negative Murphy's sign. No hernia.  Musculoskeletal: Normal range of motion.  Neurological: She is alert. She has normal strength. She is disoriented. No cranial nerve deficit or sensory deficit. Coordination normal. GCS eye subscore is 4. GCS verbal subscore is 4. GCS motor subscore is 6.  Skin: Skin is warm, dry and intact. No rash noted. No cyanosis.  Psychiatric: She has a normal mood and affect. Her speech is normal and behavior is normal. Thought content normal.  Nursing note and vitals reviewed.    ED Treatments / Results  Labs (all labs ordered are listed, but only abnormal results are displayed) Labs Reviewed  COMPREHENSIVE METABOLIC PANEL - Abnormal;  Notable for the following components:      Result Value   BUN 30 (*)    Creatinine, Ser 1.73 (*)    Calcium 8.8 (*)    Albumin 3.4 (*)    AST 311 (*)    ALT 136 (*)    GFR calc non Af Amer 33 (*)    GFR calc Af Amer 38 (*)    All other components within normal limits  CBC - Abnormal; Notable for the following components:   RBC 3.50 (*)    MCV 106.0 (*)    MCH 35.4 (*)    Platelets 31 (*)    All other components within normal limits  URINALYSIS, ROUTINE W REFLEX MICROSCOPIC - Abnormal; Notable for the following components:   Color, Urine AMBER (*)    APPearance HAZY (*)    Hgb urine dipstick SMALL (*)    Protein, ur 30 (*)    Bacteria, UA RARE (*)    All other components within normal limits  AMMONIA - Abnormal; Notable for the following components:   Ammonia 87 (*)    All other components within normal limits  ACETAMINOPHEN LEVEL - Abnormal; Notable for the following components:   Acetaminophen (Tylenol), Serum <10 (*)    All other components within normal limits  RAPID URINE DRUG SCREEN, HOSP PERFORMED - Abnormal; Notable for the following components:   Opiates POSITIVE (*)    Barbiturates   (*)    Value: Result not available. Reagent lot number recalled by manufacturer.   All other components within normal limits  PROTIME-INR  ETHANOL  SALICYLATE LEVEL  LIPASE, BLOOD  DIFFERENTIAL  CBG MONITORING, ED    EKG EKG Interpretation  Date/Time:  Monday August 06 2017 04:55:53 EDT Ventricular Rate:  89 PR Interval:    QRS Duration: 81 QT Interval:  459 QTC Calculation: 559 R Axis:   54 Text Interpretation:  Sinus rhythm Low voltage, precordial leads Nonspecific ST and T wave abnormality Prolonged QT interval Baseline wander in lead(s) II III aVR aVL aVF No previous tracing Confirmed by Orpah Greek [id number]) on 08/06/2017 5:59:24 AM   Radiology No results found.  Procedures Procedures (including critical care time)  Medications Ordered in ED Medications -  No data to display   Initial Impression / Assessment and Plan / ED Course  I have reviewed the triage vital signs and the nursing notes.  Pertinent labs & imaging results that were available during my care of the patient were reviewed by me and considered in my medical decision making (see chart for details).     Significant other reports that the patient has not been acting like her normal self for the last couple of days and now this morning awakened with nausea, vomiting and increased confusion.  He is not aware of all of her medical history.  She does have a history of alcoholic cirrhosis.  She had not been experiencing fevers or other ill symptoms prior to this morning.  He noticed a slow decline in her mental status until today.  No trauma.  Patient is afebrile.  She has a normal white count of 5.5.  Conference of metabolic panel reveals elevated BUN and creatinine, baseline unknown.  She does have elevated AST and ALT consistent with previous history of alcoholic hepatitis.  It is unclear if she has been drinking heavily recently.  Her ammonia is elevated, at least in part responsible for her mental status changes.  CT head unremarkable.  Chest x-ray does not show significant abnormality.  Patient's blood pressure is on the low side.  Husband reports that it was low at her most recent doctor's appointment within the last week or so.  She was being seen at that time for increased swelling of her legs.  It sounds like her liver disease is worsening.  Patient also recently saw a dentist for a fractured tooth.  She was placed on amoxicillin and a pain pill.  She has only been taking these medications for 2 days.  Salicylates and Tylenol level undetectable, doubt overdose at this time.  Patient will require hospitalization for further evaluation of her acute mental status changes.  Final Clinical Impressions(s) / ED Diagnoses   Final diagnoses:  Acute encephalopathy    ED Discharge Orders      None       Orpah Greek, MD 08/06/17 [phone]

## 2017-08-06 NOTE — H&P (Signed)
4        History and Physical    [name] HCW:[id number] DOB: Oct 26, 1965 DOA: 08/06/2017  PCP: Celene Squibb, MD  Patient coming from: Home.  I have personally briefly reviewed patient's old medical records in [place]  Chief Complaint: AMS.  HPI: Rachel Gross is a 52 y.o. female with medical history significant of alcoholic hepatitis with cystitis, cervical dysplasia, history of HPV infection, history of pneumonia, COPD/emphysema, tobacco use, bipolar disorder who is brought to the emergency department via EMS after her father noticed that she was not waking up after being very somnolent for the past 2 days.  Apparently, the patient went to the dentist on Friday.  She was given an antibiotic and opioid analgesic, which she first took on Friday evening.  This was the last time that her father saw her acting out her baseline.  She was somnolent on Saturday and has been progressively more somnolent since then.  She has not eaten or had much to drink since then.  To her father's knowledge, she has not had a fever, cough, nausea, vomiting, diarrhea or complaints of urinary symptoms.  He found her on the floor, which is carpeted, yesterday, but does not think that she sustained in the injuries and is not sure if she just slid down or fell from bed.  He believes that she has not taken her medications since Friday.  He is unable to provide further history.  ED Course: Initial vital signs temperature 98.7 F, pulse 89, respirations 18, blood pressure 106/67 mmHg and O2 sat 93% on room air.  Her urinalysis had a hazy appearance, with small hemoglobinuria, 30 mg of proteinuria, 11-20 WBC and rare bacteria on microscopic.  Urine drug screen was positive for opiates, but the patient was given a prescription by her dentist.  Her white count was 5.5 with 51% neutrophils, 32% lymphocytes and 17% monocytes.  Hemoglobin 12.4 g/dL and platelets 31. PT 15.2 seconds and INR 1.21. CMP showed a sodium of  137, potassium of 4.0, chloride 103 and CO2 24 mmol/L.  BUN 30, creatinine 1.73, calcium 8.8 and glucose 90 mg/dL. Her LFTs show a total protein of 7.2 and albumin of 3.4 g/dL.  AST is 311 and ALT of 136 units/L.  Alkaline phosphatase and bilirubin are normal. Lipase, alcohol, acetaminophen and salicylate levels are all within normal limits.  However, her ammonia level was elevated at 87 mol/L.  Imaging: Chest radiograph showed hypoventilatory chest with bronchovascular crowding.  Diffuse peribronchial thickening, which may be bronchitis or pulmonary edema.  CT head without contrast did not show any acute intracranial normality.  However there was generalized atrophy, mildly advanced for the patient's age.  The images and full radiology report for further detail.  Review of Systems: Unable to obtain.    Past Medical History:  Diagnosis Date  . Alcoholic hepatitis without ascites   . 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 COPD (chronic obstructive pulmonary disease) ([place])   . Emphysema lung ([place])   . History of HPV infection   . History of pneumonia   . Smoker     Past Surgical History:  Procedure Laterality Date  . CESAREAN SECTION       reports that she has been smoking cigarettes.  She has been smoking about 1.00 pack per day. She has never used smokeless tobacco. She reports that she drank alcohol. She reports that she has current or past drug history.  Allergies  Allergen Reactions  . Fish Allergy Nausea Only    Family History  Problem Relation Age of Onset  . Congenital heart disease Mother   . Bipolar disorder Mother   . Prostate cancer Brother   . Alzheimer's disease Paternal Grandmother     Prior to Admission medications   Medication Sig Start Date End Date Taking? Authorizing Provider  divalproex (DEPAKOTE) 500 MG DR tablet Take 500 mg by mouth 2 (two) times daily.    [provider]  levothyroxine (SYNTHROID, LEVOTHROID) 25 MCG  tablet Take 25 mcg by mouth daily before breakfast.    [provider]  metoprolol succinate (TOPROL-XL) 25 MG 24 hr tablet Take 25 mg by mouth daily.    [provider]  risperiDONE (RISPERDAL) 2 MG tablet Take 2 mg by mouth at bedtime.    [provider]    Physical Exam: Vitals:   08/06/17 0500 08/06/17 0600 08/06/17 0605 08/06/17 0630  BP: 106/67 (!) 96/58  (!) 93/50  Pulse: 89 78  82  Resp: 18 10    Temp:   98.7 F (37.1 C)   TempSrc:   Rectal   SpO2: 93% 100%  100%    Constitutional: Looks a lot older than chronological age.  Afebrile, obtunded, wakes up briefly. Eyes: PERRL, lids and conjunctivae normal ENMT: Mucous membranes are mildly dry.  Posterior pharynx clear of any exudate or lesions. Poor state of repair of dentition. Neck: normal, supple, no masses, no thyromegaly Respiratory: Decreased breath sounds on bases, but no wheezing, no crackles. Normal respiratory effort. No accessory muscle use.  Cardiovascular: Regular rate and rhythm, no murmurs / rubs / gallops. No extremity edema. 2+ pedal pulses. No carotid bruits.  Abdomen: No distention, soft, no tenderness, no masses palpated. No hepatosplenomegaly. Bowel sounds positive.  Musculoskeletal: no clubbing / cyanosis. Good ROM, no contractures.  Relaxed muscle tone.  Skin: Positive erythematosus rash in abdomen. Neurologic: Obtunded, which limits evaluation.  Grossly nonfocal. Psychiatric: Obtunded, wakes up briefly, follows simple commands, but goes back to sleep within seconds.   Labs on Admission: I have personally reviewed following labs and imaging studies  CBC: Recent Labs  Lab 08/06/17 0503  WBC 5.5  NEUTROABS 2.8  HGB 12.4  HCT 37.1  MCV 106.0*  PLT 31*   Basic Metabolic Panel: Recent Labs  Lab 08/06/17 0503  NA 137  K 4.0  CL 103  CO2 24  GLUCOSE 90  BUN 30*  CREATININE 1.73*  CALCIUM 8.8*   GFR: CrCl cannot be calculated (Unknown ideal weight.). Liver  Function Tests: Recent Labs  Lab 08/06/17 0503  AST 311*  ALT 136*  ALKPHOS 50  BILITOT 1.0  PROT 7.2  ALBUMIN 3.4*   Recent Labs  Lab 08/06/17 0511  LIPASE 45   Recent Labs  Lab 08/06/17 0511  AMMONIA 87*   Coagulation Profile: Recent Labs  Lab 08/06/17 0511  INR 1.21   Cardiac Enzymes: No results for input(s): CKTOTAL, CKMB, CKMBINDEX, TROPONINI in the last 168 hours. BNP (last 3 results) No results for input(s): PROBNP in the last 8760 hours. HbA1C: No results for input(s): HGBA1C in the last 72 hours. CBG: Recent Labs  Lab 08/06/17 0545  GLUCAP 78   Lipid Profile: No results for input(s): CHOL, HDL, LDLCALC, TRIG, CHOLHDL, LDLDIRECT in the last 72 hours. Thyroid Function Tests: No results for input(s): TSH, T4TOTAL, FREET4, T3FREE, THYROIDAB in the last 72 hours. Anemia Panel: No results for input(s): VITAMINB12, FOLATE, FERRITIN, TIBC, IRON,  RETICCTPCT in the last 72 hours. Urine analysis:    Component Value Date/Time   COLORURINE AMBER (A) 08/06/2017 0504   APPEARANCEUR HAZY (A) 08/06/2017 0504   LABSPEC 1.018 08/06/2017 0504   PHURINE 5.0 08/06/2017 0504   GLUCOSEU NEGATIVE 08/06/2017 0504   HGBUR SMALL (A) 08/06/2017 0504   BILIRUBINUR NEGATIVE 08/06/2017 0504   KETONESUR NEGATIVE 08/06/2017 0504   PROTEINUR 30 (A) 08/06/2017 0504   NITRITE NEGATIVE 08/06/2017 0504   LEUKOCYTESUR NEGATIVE 08/06/2017 0504    Radiological Exams on Admission: Ct Head Wo Contrast  Result Date: 08/06/2017 CLINICAL DATA:  Altered mental status. EXAM: CT HEAD WITHOUT CONTRAST TECHNIQUE: Contiguous axial images were obtained from the base of the skull through the vertex without intravenous contrast. COMPARISON:  None. FINDINGS: Brain: Generalized atrophy, slightly advanced for age. No intracranial hemorrhage, mass effect, or midline shift. No hydrocephalus. The basilar cisterns are patent. No evidence of territorial infarct or acute ischemia. No extra-axial or  intracranial fluid collection. Vascular: No hyperdense vessel. Skull: No fracture or focal lesion. Sinuses/Orbits: Bilateral cataract resection.  No acute finding. Other: None. IMPRESSION: 1.  No acute intracranial abnormality. 2. Generalized atrophy, mildly advanced for age. Electronically Signed   By: Jeb Levering M.D.   On: 08/06/2017 06:15   Dg Chest Port 1 View  Result Date: 08/06/2017 CLINICAL DATA:  Mental status change. EXAM: PORTABLE CHEST 1 VIEW COMPARISON:  None. FINDINGS: Low lung volumes with bronchovascular crowding. The heart is normal in size. Diffuse peribronchial thickening. No confluent airspace disease. Questionable blunting of right costophrenic angle. No pneumothorax. IMPRESSION: 1. Hypoventilatory chest with bronchovascular crowding. 2. Diffuse peribronchial thickening, which may reflect bronchitis or pulmonary edema Electronically Signed   By: Jeb Levering M.D.   On: 08/06/2017 06:24    EKG: Independently reviewed.  Vent. rate 89 BPM PR interval * ms QRS duration 81 ms QT/QTc 459/559 ms P-R-T axes * 54 -42 Sinus rhythm Low voltage, precordial leads Nonspecific ST and T wave abnormality Prolonged QT interval Baseline wander in lead(s) II III aVR aVL aVF   Assessment/Plan Principal Problem:   Acute encephalopathy This is likely multifactorial. Hyperammonemia, medications and dehydration. Supplemental oxygen. Continue IV hydration. Lactulose. Check valproic acid level.  Active Problems:   Hyperammonemia (HCC) IV hydration. Start lactulose and enema. Switch to oral lactulose once mental status improves. Follow-up ammonia level. Follow-up liver function test.    Transaminitis Follow-up hepatic function tests.    Dehydration Has not eaten or had anything to drink probably since Friday evening NS 1 L bolus. Gentle time-limited IV hydration afterwards.    COPD (chronic obstructive pulmonary disease) (HCC) Supplemental oxygen. Bronchodilators as  needed.    Idiopathic thrombocytopenic purpura (ITP) (HCC) Monitor platelet level.   DVT prophylaxis: SCDs. Code Status: Full code. Family Communication: Her father was present in the ED room. Disposition Plan: Observation for IV hydration and lactulose enema. Consults called: Admission status: Observation/telemetry.   Reubin Milan MD [facility] Pager [phone].  If 7PM-7AM, please contact night-coverage [email or web address] Password [facility]  08/06/2017, 6:58 AM

## 2017-08-06 NOTE — Progress Notes (Signed)
No urine output noted for shift. Bladder scan performed and revealed >704 mL of urine. MD notified. Order received for foley for acute urinary retention.

## 2017-08-06 NOTE — ED Triage Notes (Signed)
Pt brought in by [facility] for c/o altered mental status; husband reported to ems that pt woke up this am not acting her normal self; pt cbg 85; pt has been vomiting; husband states pt has not been acting her normal for the last few days

## 2017-08-07 ENCOUNTER — Inpatient Hospital Stay (HOSPITAL_COMMUNITY): Payer: Medicaid Other

## 2017-08-07 DIAGNOSIS — Z8701 Personal history of pneumonia (recurrent): Secondary | ICD-10-CM | POA: Diagnosis not present

## 2017-08-07 DIAGNOSIS — R001 Bradycardia, unspecified: Secondary | ICD-10-CM | POA: Diagnosis present

## 2017-08-07 DIAGNOSIS — Z7141 Alcohol abuse counseling and surveillance of alcoholic: Secondary | ICD-10-CM | POA: Diagnosis not present

## 2017-08-07 DIAGNOSIS — D693 Immune thrombocytopenic purpura: Secondary | ICD-10-CM | POA: Diagnosis not present

## 2017-08-07 DIAGNOSIS — F17210 Nicotine dependence, cigarettes, uncomplicated: Secondary | ICD-10-CM | POA: Diagnosis present

## 2017-08-07 DIAGNOSIS — D696 Thrombocytopenia, unspecified: Secondary | ICD-10-CM | POA: Diagnosis present

## 2017-08-07 DIAGNOSIS — J449 Chronic obstructive pulmonary disease, unspecified: Secondary | ICD-10-CM | POA: Diagnosis present

## 2017-08-07 DIAGNOSIS — F319 Bipolar disorder, unspecified: Secondary | ICD-10-CM | POA: Diagnosis present

## 2017-08-07 DIAGNOSIS — Z862 Personal history of diseases of the blood and blood-forming organs and certain disorders involving the immune mechanism: Secondary | ICD-10-CM | POA: Diagnosis not present

## 2017-08-07 DIAGNOSIS — R740 Nonspecific elevation of levels of transaminase and lactic acid dehydrogenase [LDH]: Secondary | ICD-10-CM | POA: Diagnosis not present

## 2017-08-07 DIAGNOSIS — Z8249 Family history of ischemic heart disease and other diseases of the circulatory system: Secondary | ICD-10-CM | POA: Diagnosis not present

## 2017-08-07 DIAGNOSIS — E860 Dehydration: Secondary | ICD-10-CM | POA: Diagnosis present

## 2017-08-07 DIAGNOSIS — E870 Hyperosmolality and hypernatremia: Secondary | ICD-10-CM | POA: Diagnosis not present

## 2017-08-07 DIAGNOSIS — I10 Essential (primary) hypertension: Secondary | ICD-10-CM | POA: Diagnosis present

## 2017-08-07 DIAGNOSIS — E7220 Disorder of urea cycle metabolism, unspecified: Secondary | ICD-10-CM | POA: Diagnosis present

## 2017-08-07 DIAGNOSIS — Z79890 Hormone replacement therapy (postmenopausal): Secondary | ICD-10-CM | POA: Diagnosis not present

## 2017-08-07 DIAGNOSIS — K7010 Alcoholic hepatitis without ascites: Secondary | ICD-10-CM | POA: Diagnosis present

## 2017-08-07 DIAGNOSIS — Z789 Other specified health status: Secondary | ICD-10-CM | POA: Diagnosis not present

## 2017-08-07 DIAGNOSIS — Z87410 Personal history of cervical dysplasia: Secondary | ICD-10-CM | POA: Diagnosis not present

## 2017-08-07 DIAGNOSIS — N179 Acute kidney failure, unspecified: Secondary | ICD-10-CM | POA: Diagnosis present

## 2017-08-07 DIAGNOSIS — G9340 Encephalopathy, unspecified: Secondary | ICD-10-CM | POA: Diagnosis not present

## 2017-08-07 DIAGNOSIS — F1010 Alcohol abuse, uncomplicated: Secondary | ICD-10-CM | POA: Diagnosis present

## 2017-08-07 DIAGNOSIS — Z91013 Allergy to seafood: Secondary | ICD-10-CM | POA: Diagnosis not present

## 2017-08-07 DIAGNOSIS — Z79899 Other long term (current) drug therapy: Secondary | ICD-10-CM | POA: Diagnosis not present

## 2017-08-07 DIAGNOSIS — G9349 Other encephalopathy: Secondary | ICD-10-CM | POA: Diagnosis present

## 2017-08-07 LAB — CBC
HEMATOCRIT: 36.7 % (ref 36.0–46.0)
HEMOGLOBIN: 12 g/dL (ref 12.0–15.0)
MCH: 34.6 pg — ABNORMAL HIGH (ref 26.0–34.0)
MCHC: 32.7 g/dL (ref 30.0–36.0)
MCV: 105.8 fL — ABNORMAL HIGH (ref 78.0–100.0)
Platelets: 28 10*3/uL — CL (ref 150–400)
RBC: 3.47 MIL/uL — AB (ref 3.87–5.11)
RDW: 14.2 % (ref 11.5–15.5)
WBC: 3.7 10*3/uL — AB (ref 4.0–10.5)

## 2017-08-07 LAB — COMPREHENSIVE METABOLIC PANEL
ALT: 110 U/L — AB (ref 0–44)
AST: 216 U/L — ABNORMAL HIGH (ref 15–41)
Albumin: 3 g/dL — ABNORMAL LOW (ref 3.5–5.0)
Alkaline Phosphatase: 51 U/L (ref 38–126)
Anion gap: 7 (ref 5–15)
BUN: 29 mg/dL — ABNORMAL HIGH (ref 6–20)
CALCIUM: 9.3 mg/dL (ref 8.9–10.3)
CHLORIDE: 109 mmol/L (ref 98–111)
CO2: 26 mmol/L (ref 22–32)
Creatinine, Ser: 0.76 mg/dL (ref 0.44–1.00)
GFR calc Af Amer: >60 mL/min (ref >60)
Glucose, Bld: 161 mg/dL — ABNORMAL HIGH (ref 70–99)
Potassium: 4.5 mmol/L (ref 3.5–5.1)
SODIUM: 142 mmol/L (ref 135–145)
TOTAL PROTEIN: 6.5 g/dL (ref 6.5–8.1)
Total Bilirubin: 1 mg/dL (ref 0.3–1.2)

## 2017-08-07 LAB — AMMONIA: AMMONIA: 118 umol/L — AB (ref 9–35)

## 2017-08-07 MED ORDER — LACTULOSE 10 GM/15ML PO SOLN
30.0000 g | Freq: Three times a day (TID) | ORAL | Status: DC
  Administered 2017-08-07 – 2017-08-09 (×8): 30 g
  Filled 2017-08-07 (×8): qty 60

## 2017-08-07 MED ORDER — LACTATED RINGERS IV SOLN
INTRAVENOUS | Status: AC
  Administered 2017-08-07 (×2): via INTRAVENOUS

## 2017-08-07 NOTE — Progress Notes (Signed)
PROGRESS NOTE    Saphia Vanderford  MGQ:[id number] DOB: 10-20-1965 DOA: 08/06/2017 PCP: Celene Squibb, MD   Brief Narrative:   Pleshette Tomasini a 52 y.o.femalewith medical history significant of alcoholic hepatitis with cystitis, cervical dysplasia, history of HPV infection, history of pneumonia, COPD/emphysema, tobacco use, bipolar disorder who is brought to the emergency department via EMS after her father noticed that she was not waking up after being very somnolent for the past 2 days. She has been admitted with multifactorial encephalopathy with hyperammonemia related to alcoholic hepatitis and recent narcotic use. She is noted to have history of immune thrombocytopenia for which she sees Dr. Lamonte Richer and has been recommended pulse dose dexamethasone for response-this was started on 7/8. Her ammonia has trended up today for which plans are for NGT and lactulose via this pathway.   Assessment & Plan:   Principal Problem:   Acute encephalopathy Active Problems:   Idiopathic thrombocytopenic purpura (ITP) (HCC)   Hyperammonemia (HCC)   Dehydration   COPD (chronic obstructive pulmonary disease) (HCC)   Transaminitis   1. Acute multifactorial encephalopathy.  This is persistent with upward trend of ammonia levels today.  Will discontinue lactulose enema and plan for NG tube placement with administration of lactulose via NG tube.  Repeat ammonia levels in a.m.  Continue IV fluid hydration.  Patient was noted to have positive narcotics on UDS during admission which was administered during recent dental procedure.  Avoid any narcotic or sedating medications and continue monitoring with neuro checks and stepdown unit. 2. Hyperammonemia.  As above with follow-up ammonia level in a.m. 3. Thrombocytopenia with noted history of ITP.  Continue to monitor platelet level and repeat CBC and maintain on IV dexamethasone for total of 40 mg every 24 hours for the next 3 days.  This continues to  downtrend, but case has been discussed with Dr. Delton Coombes who is aware.  Please consult formally should this continue to worsen. 4. Transaminitis.  Secondary to alcoholic hepatitis which is currently downtrending. 5. Dehydration.  Currently altered with no oral intake and will continue IV fluid hydration through today.  Urine output of over 1 L yesterday with slight positive fluid balance. 6. COPD.  Continue supplemental oxygen with good pulse oximetry readings noted.  Bronchodilators as needed.   DVT prophylaxis:SCDs Code Status: Full Family Communication: None at bedside Disposition Plan: Continue to hydrate and reduce ammonia levels to improve mentation.  No acute findings on head CT during admission.  Continue monitoring in stepdown unit until alertness improves.   Consultants:   None  Procedures:   None  Antimicrobials:   None   Subjective: Patient seen and evaluated today with no new acute complaints or concerns. No acute concerns or events noted overnight.  She continues to remain quite somnolent and mostly unresponsive.  Her ammonia levels have trended up.  No family members have been available at bedside.  Objective: Vitals:   08/06/17 2300 08/07/17 0000 08/07/17 0400 08/07/17 0500  BP: 104/61 111/63    Pulse: 86 93    Resp: 13 20    Temp:  99.6 F (37.6 C) 98.3 F (36.8 C)   TempSrc:  Axillary Axillary   SpO2: 99% 97%    Weight:    68.4 kg (150 lb 12.7 oz)  Height:        Intake/Output Summary (Last 24 hours) at 08/07/2017 0742 Last data filed at 08/07/2017 0738 Gross per 24 hour  Intake 1700 ml  Output 1650 ml  Net 50  ml   Filed Weights   08/06/17 1000 08/07/17 0500  Weight: 69.3 kg (152 lb 12.5 oz) 68.4 kg (150 lb 12.7 oz)    Examination:  General exam: Appears calm and comfortable; somnolent and minimally responsive to voice and palpation Respiratory system: Clear to auscultation. Respiratory effort normal. Cardiovascular system: S1 & S2 heard,  RRR. No JVD, murmurs, rubs, gallops or clicks. No pedal edema. Gastrointestinal system: Abdomen is nondistended, soft and nontender. No organomegaly or masses felt. Normal bowel sounds heard. Central nervous system: Cannot be assessed Skin: Petechiae present over abdominal region Psychiatry: Cannot be assessed Foley with clear yellow urine output noted. Rectal tube with liquidy stool.    Data Reviewed: I have personally reviewed following labs and imaging studies  CBC: Recent Labs  Lab 08/06/17 0503 08/07/17 0412  WBC 5.5 3.7*  NEUTROABS 2.8  --   HGB 12.4 12.0  HCT 37.1 36.7  MCV 106.0* 105.8*  PLT 31* 28*   Basic Metabolic Panel: Recent Labs  Lab 08/06/17 0503 08/06/17 0657 08/07/17 0412  NA 137  --  142  K 4.0  --  4.5  CL 103  --  109  CO2 24  --  26  GLUCOSE 90  --  161*  BUN 30*  --  29*  CREATININE 1.73*  --  0.76  CALCIUM 8.8*  --  9.3  MG  --  2.2  --   PHOS  --  4.9*  --    GFR: Estimated Creatinine Clearance: 77.3 mL/min (by C-G formula based on SCr of 0.76 mg/dL). Liver Function Tests: Recent Labs  Lab 08/06/17 0503 08/07/17 0412  AST 311* 216*  ALT 136* 110*  ALKPHOS 50 51  BILITOT 1.0 1.0  PROT 7.2 6.5  ALBUMIN 3.4* 3.0*   Recent Labs  Lab 08/06/17 0511  LIPASE 45   Recent Labs  Lab 08/06/17 0511 08/07/17 0412  AMMONIA 87* 118*   Coagulation Profile: Recent Labs  Lab 08/06/17 0511  INR 1.21   Cardiac Enzymes: No results for input(s): CKTOTAL, CKMB, CKMBINDEX, TROPONINI in the last 168 hours. BNP (last 3 results) No results for input(s): PROBNP in the last 8760 hours. HbA1C: No results for input(s): HGBA1C in the last 72 hours. CBG: Recent Labs  Lab 08/06/17 0545 08/06/17 0904  GLUCAP 78 112*   Lipid Profile: No results for input(s): CHOL, HDL, LDLCALC, TRIG, CHOLHDL, LDLDIRECT in the last 72 hours. Thyroid Function Tests: Recent Labs    08/06/17 0503  TSH 2.902   Anemia Panel: No results for input(s):  VITAMINB12, FOLATE, FERRITIN, TIBC, IRON, RETICCTPCT in the last 72 hours. Sepsis Labs: No results for input(s): PROCALCITON, LATICACIDVEN in the last 168 hours.  Recent Results (from the past 240 hour(s))  MRSA PCR Screening     Status: None   Collection Time: 08/06/17  9:55 AM  Result Value Ref Range Status   MRSA by PCR NEGATIVE NEGATIVE Final    Comment:        The GeneXpert MRSA Assay (FDA approved for NASAL specimens only), is one component of a comprehensive MRSA colonization surveillance program. It is not intended to diagnose MRSA infection nor to guide or monitor treatment for MRSA infections. Performed at [facility], [address] [place], [place] [address]          Radiology Studies: Ct Head Wo Contrast  Result Date: 08/06/2017 CLINICAL DATA:  Altered mental status. EXAM: CT HEAD WITHOUT CONTRAST TECHNIQUE: Contiguous axial images were obtained from  the base of the skull through the vertex without intravenous contrast. COMPARISON:  None. FINDINGS: Brain: Generalized atrophy, slightly advanced for age. No intracranial hemorrhage, mass effect, or midline shift. No hydrocephalus. The basilar cisterns are patent. No evidence of territorial infarct or acute ischemia. No extra-axial or intracranial fluid collection. Vascular: No hyperdense vessel. Skull: No fracture or focal lesion. Sinuses/Orbits: Bilateral cataract resection.  No acute finding. Other: None. IMPRESSION: 1.  No acute intracranial abnormality. 2. Generalized atrophy, mildly advanced for age. Electronically Signed   By: Jeb Levering M.D.   On: 08/06/2017 06:15   Dg Chest Port 1 View  Result Date: 08/06/2017 CLINICAL DATA:  Mental status change. EXAM: PORTABLE CHEST 1 VIEW COMPARISON:  None. FINDINGS: Low lung volumes with bronchovascular crowding. The heart is normal in size. Diffuse peribronchial thickening. No confluent airspace disease. Questionable blunting of right costophrenic angle. No pneumothorax.  IMPRESSION: 1. Hypoventilatory chest with bronchovascular crowding. 2. Diffuse peribronchial thickening, which may reflect bronchitis or pulmonary edema Electronically Signed   By: Jeb Levering M.D.   On: 08/06/2017 06:24        Scheduled Meds: . dexamethasone  10 mg Intravenous Q6H  . ipratropium-albuterol  3 mL Nebulization TID  . lactulose  300 mL Rectal BID  . mouth rinse  15 mL Mouth Rinse BID   Continuous Infusions:   LOS: 0 days    Time spent: 30 minutes    Plummer Matich Darleen Crocker, DO [facility] Pager [phone]  If 7PM-7AM, please contact night-coverage [email or web address] Password [email or web address] 08/07/2017, 7:42 AM

## 2017-08-07 NOTE — Progress Notes (Signed)
Initial Nutrition Assessment  DOCUMENTATION CODES:      INTERVENTION:  Follow for diet advancement   NUTRITION DIAGNOSIS:   Inadequate oral intake related to inability to eat as evidenced by NPO status(NGT for administration of lactulose).   GOAL:   Provide needs based on ASPEN/SCCM guidelines  MONITOR:   Diet advancement, Weight trends, Labs  REASON FOR ASSESSMENT:   Malnutrition Screening Tool, Low Braden    ASSESSMENT:  Patient has a hx of COPD, ETOH hepatitis, squamous cell of cervix, and smoker. She presents  with dehydration, encephalopathy , hyperammonemia. NGT has been placed and lactulose administered. Talked with her nurse- patient was able to follow some commands as they were placing her NG but otherwise has been lethargic and unable to provide history. No family are present. Reviewed patient weight hx which has been stable for most of the past year between 148-153 lb.  RD will continue to follow her progress and ability to advance to oral intake.   Labs: BMP Latest Ref Rng & Units 08/07/2017 08/06/2017  Glucose 70 - 99 mg/dL 161(H) 90  BUN 6 - 20 mg/dL 29(H) 30(H)  Creatinine 0.44 - 1.00 mg/dL 0.76 1.73(H)  Sodium 135 - 145 mmol/L 142 137  Potassium 3.5 - 5.1 mmol/L 4.5 4.0  Chloride 98 - 111 mmol/L 109 103  CO2 22 - 32 mmol/L 26 24  Calcium 8.9 - 10.3 mg/dL 9.3 8.8(L)    Medications reviewed and include: decadron, lactulose   NUTRITION - FOCUSED PHYSICAL EXAM:    Most Recent Value  Orbital Region  No depletion  Upper Arm Region  No depletion  Buccal Region  No depletion  Temple Region  No depletion  Patellar Region  No depletion  Anterior Thigh Region  No depletion  Edema (RD Assessment)  None  Hair  Reviewed  Eyes  Unable to assess  Mouth  Unable to assess  Skin  Reviewed  Nails  Reviewed      Diet Order:   Diet Order           Diet NPO time specified  Diet effective now         EDUCATION NEEDS:   Skin:  Skin Assessment: Reviewed RN  Assessment  Last BM:  7/9 rectal tube - large (600 ml) liquid stool  Height:   Ht Readings from Last 1 Encounters:  08/06/17 5\' 4"  (1.626 m)    Weight:   Wt Readings from Last 1 Encounters:  08/07/17 150 lb 12.7 oz (68.4 kg)    Ideal Body Weight:  55 kg  BMI:  Body mass index is 25.88 kg/m.  Estimated Nutritional Needs:   Kcal:  8657-8469  Protein:  80-85 gr  Fluid:  per MD goal    Colman Cater MS,RD,CSG,LDN Office: [phone] Pager: [phone]

## 2017-08-08 DIAGNOSIS — G9340 Encephalopathy, unspecified: Secondary | ICD-10-CM

## 2017-08-08 DIAGNOSIS — D693 Immune thrombocytopenic purpura: Secondary | ICD-10-CM

## 2017-08-08 DIAGNOSIS — J449 Chronic obstructive pulmonary disease, unspecified: Secondary | ICD-10-CM

## 2017-08-08 DIAGNOSIS — E860 Dehydration: Secondary | ICD-10-CM

## 2017-08-08 DIAGNOSIS — R740 Nonspecific elevation of levels of transaminase and lactic acid dehydrogenase [LDH]: Secondary | ICD-10-CM

## 2017-08-08 DIAGNOSIS — Z789 Other specified health status: Secondary | ICD-10-CM

## 2017-08-08 DIAGNOSIS — E7220 Disorder of urea cycle metabolism, unspecified: Secondary | ICD-10-CM

## 2017-08-08 LAB — BASIC METABOLIC PANEL
ANION GAP: 8 (ref 5–15)
BUN: 25 mg/dL — ABNORMAL HIGH (ref 6–20)
CHLORIDE: 111 mmol/L (ref 98–111)
CO2: 27 mmol/L (ref 22–32)
Calcium: 10.4 mg/dL — ABNORMAL HIGH (ref 8.9–10.3)
Creatinine, Ser: 0.52 mg/dL (ref 0.44–1.00)
GFR calc non Af Amer: >60 mL/min (ref >60)
GLUCOSE: 160 mg/dL — AB (ref 70–99)
POTASSIUM: 4 mmol/L (ref 3.5–5.1)
Sodium: 146 mmol/L — ABNORMAL HIGH (ref 135–145)

## 2017-08-08 LAB — CBC
HEMATOCRIT: 38.7 % (ref 36.0–46.0)
HEMOGLOBIN: 12.7 g/dL (ref 12.0–15.0)
MCH: 34.6 pg — ABNORMAL HIGH (ref 26.0–34.0)
MCHC: 32.8 g/dL (ref 30.0–36.0)
MCV: 105.4 fL — ABNORMAL HIGH (ref 78.0–100.0)
Platelets: 39 10*3/uL — ABNORMAL LOW (ref 150–400)
RBC: 3.67 MIL/uL — AB (ref 3.87–5.11)
RDW: 14.6 % (ref 11.5–15.5)
WBC: 4.3 10*3/uL (ref 4.0–10.5)

## 2017-08-08 LAB — AMMONIA: Ammonia: 35 umol/L (ref 9–35)

## 2017-08-08 MED ORDER — DEXAMETHASONE SODIUM PHOSPHATE 10 MG/ML IJ SOLN
INTRAMUSCULAR | Status: AC
  Filled 2017-08-08: qty 1

## 2017-08-08 MED ORDER — LACTATED RINGERS IV SOLN
INTRAVENOUS | Status: DC
Start: 2017-08-08 — End: 2017-08-09
  Administered 2017-08-08 (×2): via INTRAVENOUS

## 2017-08-08 NOTE — Progress Notes (Signed)
PROGRESS NOTE    Rachel Gross  TOI:[id number] DOB: Mar 14, 1965 DOA: 08/06/2017 PCP: Celene Squibb, MD   Brief Narrative:   Rachel Gross a 52 y.o.femalewith medical history significant of alcoholic hepatitis with cystitis, cervical dysplasia, history of HPV infection, history of pneumonia, COPD/emphysema, tobacco use, bipolar disorder who is brought to the emergency department via EMS after her father noticed that she was not waking up after being very somnolent for the past 2 days. She has been admitted with multifactorial encephalopathy with hyperammonemia related to alcoholic hepatitis and recent narcotic use. She is noted to have history of immune thrombocytopenia for which she sees Dr. Lamonte Richer and has been recommended pulse dose dexamethasone for response-this was started on 7/8. Her ammonia has trended up today for which plans are for NGT and lactulose via this pathway.   Assessment & Plan:   Principal Problem:   Acute encephalopathy Active Problems:   Idiopathic thrombocytopenic purpura (ITP) (HCC)   Hyperammonemia (HCC)   Dehydration   COPD (chronic obstructive pulmonary disease) (HCC)   Transaminitis   1. Acute multifactorial encephalopathy.  This is persistent with upward trend of ammonia levels today.  Will continue lactuclose through NGT. Ammonia level is down. No narcotics. administered during recent dental procedure.  Avoid any narcotic or sedating medications and continue monitoring with neuro checks and continue stepdown unit. 2. Hyperammonemia.  With improvement on lactulose; will follow results in am. 3. Thrombocytopenia with noted history of ITP.  Continue to monitor platelet level and repeat CBC and maintain on IV dexamethasone for total of 40 mg every 24 hours for the next 2 days.  If needed will involve Hem/Onc. 4. Transaminitis.  Secondary to alcoholic hepatitis which continue to be downtrending. 5. Dehydration.  Currently altered with no oral intake and  continue IVF's.   Follow urine output and mentation to safely advance  6. COPD.  Continue O2 supplementation and continue nebulizer treatment.    DVT prophylaxis:SCDs Code Status: Full Family Communication: None at bedside Disposition Plan: Continue to hydrate and reduce ammonia levels to improve mentation.  No acute findings on head CT during admission.  Continue monitoring in stepdown unit until alertness improves.   Consultants:   None  Procedures:   None  Antimicrobials:   None   Subjective: Afebrile, remains obtunded, bur per nursing staff reports improvement in overall alertness and interaction.  Objective: Vitals:   08/08/17 2000 08/08/17 2017 08/08/17 2100 08/08/17 2200  BP: 129/69  120/64 (!) 117/93  Pulse: (!) 46  (!) 41 87  Resp: 17  14 (!) 23  Temp:  97.6 F (36.4 C)    TempSrc:  Axillary    SpO2: 94%  93% 95%  Weight:      Height:        Intake/Output Summary (Last 24 hours) at 08/08/2017 2246 Last data filed at 08/08/2017 2200 Gross per 24 hour  Intake 1364.17 ml  Output 1075 ml  Net 289.17 ml   Filed Weights   08/06/17 1000 08/07/17 0500 08/08/17 0500  Weight: 69.3 kg (152 lb 12.5 oz) 68.4 kg (150 lb 12.7 oz) 68.2 kg (150 lb 5.7 oz)    Examination: General exam: Alertness level continue slowly improving; following some simple commands; still not talking. por insight. No fever. Good urine and stools output with use of lactulose. Respiratory system: Clear to auscultation. Respiratory effort normal. Cardiovascular system:RRR. No murmurs, rubs, gallops. Gastrointestinal system: Abdomen is nondistended, soft and nontender. No organomegaly or masses felt. Normal bowel sounds  heard. Central nervous system: Alert and oriented. No focal neurological deficits. Extremities: No trace edema bilaterally, +pedal pulses Skin: No rashes, lesions or ulcers,  Psychiatry: Judgement and insight impaired; no agitation.    Data Reviewed: I have personally  reviewed following labs and imaging studies  CBC: Recent Labs  Lab 08/06/17 0503 08/07/17 0412 08/08/17 0544  WBC 5.5 3.7* 4.3  NEUTROABS 2.8  --   --   HGB 12.4 12.0 12.7  HCT 37.1 36.7 38.7  MCV 106.0* 105.8* 105.4*  PLT 31* 28* 39*   Basic Metabolic Panel: Recent Labs  Lab 08/06/17 0503 08/06/17 0657 08/07/17 0412 08/08/17 0544  NA 137  --  142 146*  K 4.0  --  4.5 4.0  CL 103  --  109 111  CO2 24  --  26 27  GLUCOSE 90  --  161* 160*  BUN 30*  --  29* 25*  CREATININE 1.73*  --  0.76 0.52  CALCIUM 8.8*  --  9.3 10.4*  MG  --  2.2  --   --   PHOS  --  4.9*  --   --    GFR: Estimated Creatinine Clearance: 77.2 mL/min (by C-G formula based on SCr of 0.52 mg/dL).   Liver Function Tests: Recent Labs  Lab 08/06/17 0503 08/07/17 0412  AST 311* 216*  ALT 136* 110*  ALKPHOS 50 51  BILITOT 1.0 1.0  PROT 7.2 6.5  ALBUMIN 3.4* 3.0*   Recent Labs  Lab 08/06/17 0511  LIPASE 45   Recent Labs  Lab 08/06/17 0511 08/07/17 0412 08/08/17 0544  AMMONIA 87* 118* 35   Coagulation Profile: Recent Labs  Lab 08/06/17 0511  INR 1.21    Recent Labs  Lab 08/06/17 0545 08/06/17 0904  GLUCAP 78 112*   Lipid Profile: No results for input(s): CHOL, HDL, LDLCALC, TRIG, CHOLHDL, LDLDIRECT in the last 72 hours. Thyroid Function Tests: Recent Labs    08/06/17 0503  TSH 2.902     Recent Results (from the past 240 hour(s))  MRSA PCR Screening     Status: None   Collection Time: 08/06/17  9:55 AM  Result Value Ref Range Status   MRSA by PCR NEGATIVE NEGATIVE Final    Comment:        The GeneXpert MRSA Assay (FDA approved for NASAL specimens only), is one component of a comprehensive MRSA colonization surveillance program. It is not intended to diagnose MRSA infection nor to guide or monitor treatment for MRSA infections. Performed at [facility], [address] [place], [place] [address]     Radiology Studies: Dg Chest [facility] 1 View  Result Date:  08/07/2017 CLINICAL DATA:  Esophagogastric tube placement. EXAM: PORTABLE CHEST 1 VIEW COMPARISON:  Chest x-ray of August 06, 2017 FINDINGS: The esophagogastric tube has its tip and proximal port below the left hemidiaphragm. The tip is in the cardia with the proximal port more distal in the body. IMPRESSION: The nasogastric tube tip and proximal port project below the GE junction in the stomach. Electronically Signed   By: David  Martinique M.D.   On: 08/07/2017 10:43    Scheduled Meds: . dexamethasone  10 mg Intravenous Q6H  . lactulose  30 g Per Tube TID  . mouth rinse  15 mL Mouth Rinse BID   Continuous Infusions: . lactated ringers 50 mL/hr at 08/08/17 2011     LOS: 1 day    Time spent: 59 minutes    Barton Dubois, MD [facility] Pager [phone]  If 7PM-7AM, please contact night-coverage [email or web address] Password [email or web address] 08/08/2017, 10:46 PM

## 2017-08-09 DIAGNOSIS — E870 Hyperosmolality and hypernatremia: Secondary | ICD-10-CM

## 2017-08-09 LAB — COMPREHENSIVE METABOLIC PANEL
ALBUMIN: 2.7 g/dL — AB (ref 3.5–5.0)
ALT: 78 U/L — AB (ref 0–44)
AST: 92 U/L — AB (ref 15–41)
Alkaline Phosphatase: 39 U/L (ref 38–126)
Anion gap: 6 (ref 5–15)
BUN: 27 mg/dL — AB (ref 6–20)
CHLORIDE: 114 mmol/L — AB (ref 98–111)
CO2: 30 mmol/L (ref 22–32)
CREATININE: 0.51 mg/dL (ref 0.44–1.00)
Calcium: 9.8 mg/dL (ref 8.9–10.3)
GFR calc Af Amer: >60 mL/min (ref >60)
GLUCOSE: 156 mg/dL — AB (ref 70–99)
Potassium: 3.3 mmol/L — ABNORMAL LOW (ref 3.5–5.1)
Sodium: 150 mmol/L — ABNORMAL HIGH (ref 135–145)
Total Bilirubin: 0.9 mg/dL (ref 0.3–1.2)
Total Protein: 6.2 g/dL — ABNORMAL LOW (ref 6.5–8.1)

## 2017-08-09 LAB — CBC
HCT: 36.1 % (ref 36.0–46.0)
Hemoglobin: 11.5 g/dL — ABNORMAL LOW (ref 12.0–15.0)
MCH: 34.1 pg — ABNORMAL HIGH (ref 26.0–34.0)
MCHC: 31.9 g/dL (ref 30.0–36.0)
MCV: 107.1 fL — AB (ref 78.0–100.0)
Platelets: 43 10*3/uL — ABNORMAL LOW (ref 150–400)
RBC: 3.37 MIL/uL — AB (ref 3.87–5.11)
RDW: 14.9 % (ref 11.5–15.5)
WBC: 3.6 10*3/uL — AB (ref 4.0–10.5)

## 2017-08-09 MED ORDER — DEXAMETHASONE SODIUM PHOSPHATE 10 MG/ML IJ SOLN
INTRAMUSCULAR | Status: AC
  Filled 2017-08-09: qty 1

## 2017-08-09 MED ORDER — HALOPERIDOL LACTATE 5 MG/ML IJ SOLN
2.0000 mg | Freq: Once | INTRAMUSCULAR | Status: AC
  Administered 2017-08-09: 2 mg via INTRAVENOUS
  Filled 2017-08-09: qty 1

## 2017-08-09 MED ORDER — LACTULOSE 10 GM/15ML PO SOLN
30.0000 g | Freq: Three times a day (TID) | ORAL | Status: DC
  Administered 2017-08-09 – 2017-08-10 (×3): 30 g via ORAL
  Filled 2017-08-09 (×3): qty 60

## 2017-08-09 MED ORDER — POTASSIUM CL IN DEXTROSE 5% 20 MEQ/L IV SOLN
20.0000 meq | INTRAVENOUS | Status: DC
  Administered 2017-08-09 – 2017-08-10 (×2): 20 meq via INTRAVENOUS
  Filled 2017-08-09 (×8): qty 1000

## 2017-08-09 MED ORDER — LEVOTHYROXINE SODIUM 100 MCG IV SOLR
12.5000 ug | Freq: Every day | INTRAVENOUS | Status: DC
  Administered 2017-08-09 – 2017-08-10 (×2): 12.5 ug via INTRAVENOUS
  Filled 2017-08-09 (×2): qty 5

## 2017-08-09 MED ORDER — DIVALPROEX SODIUM 250 MG PO DR TAB
500.0000 mg | DELAYED_RELEASE_TABLET | Freq: Two times a day (BID) | ORAL | Status: DC
  Administered 2017-08-09 – 2017-08-10 (×2): 500 mg via ORAL
  Filled 2017-08-09 (×2): qty 2

## 2017-08-09 MED ORDER — RISPERIDONE 1 MG PO TABS
2.0000 mg | ORAL_TABLET | Freq: Every day | ORAL | Status: DC
  Administered 2017-08-09: 2 mg via ORAL
  Filled 2017-08-09: qty 2

## 2017-08-09 NOTE — Progress Notes (Signed)
PROGRESS NOTE    Rachel Gross  BJY:[id number] DOB: Jun 24, 1965 DOA: 08/06/2017 PCP: Celene Squibb, MD   Brief Narrative:   Rachel Gross a 52 y.o.femalewith medical history significant of alcoholic hepatitis with cystitis, cervical dysplasia, history of HPV infection, history of pneumonia, COPD/emphysema, tobacco use, bipolar disorder who is brought to the emergency department via EMS after her father noticed that she was not waking up after being very somnolent for the past 2 days. She has been admitted with multifactorial encephalopathy with hyperammonemia related to alcoholic hepatitis and recent narcotic use. She is noted to have history of immune thrombocytopenia for which she sees Dr. Lamonte Richer and has been recommended pulse dose dexamethasone for response-this was started on 7/8. Her ammonia has trended up today for which plans are for NGT and lactulose via this pathway.   Assessment & Plan:   Principal Problem:   Acute encephalopathy Active Problems:   Idiopathic thrombocytopenic purpura (ITP) (HCC)   Hyperammonemia (HCC)   Dehydration   COPD (chronic obstructive pulmonary disease) (HCC)   Transaminitis   1. Acute multifactorial encephalopathy.  This is persistent with upward trend of ammonia levels today.  Will continue lactulose, but PO now. D/c NGT. through NGT. Ammonia level is trending down. No narcotics. Will resume depakote and risperdal  2. Hyperammonemia.  With improvement on lactulose; will follow trend.  3. Thrombocytopenia with noted history of ITP.  Continue to monitor platelet level and repeat CBC and maintain on IV dexamethasone for total of 40 mg every 24 hours for 1 more day.  If needed will involve Hem/Onc. Platelets count trending up; no signs of acute bleeding. 4. Transaminitis.  Secondary to alcoholic hepatitis which continue to be downtrending. 5. Dehydration.  Continue IVF's and advance diet. 6. COPD.  Continue O2 supplementation and continue  nebulizer treatment.  Breathing has remained stable. 7. Hypernatremia: Will switch IV fluids to D5W and will advance diet.  Follow electrolytes trend.   DVT prophylaxis:SCDs Code Status: Full Family Communication: None at bedside Disposition Plan: Continue to hydrate, follow ammonia levels, follow mentation.  No acute findings on head CT during admission.  Continue monitoring in stepdown unit until alertness improves more.   Consultants:   None  Procedures:   None  Antimicrobials:   None   Subjective: Afebrile, remains a slightly obtunded and lethargic.  But nursing staff is reporting significant improvement in current mentation, asking for food and experiencing intermittent agitation trying to get out of bed and pulling IVs/telemetry.  Objective: Vitals:   08/09/17 1200 08/09/17 1300 08/09/17 1400 08/09/17 1500  BP: 119/65 127/64 121/77 117/60  Pulse: (!) 39 (!) 39 69 (!) 43  Resp: 12 13 (!) 23 14  Temp:      TempSrc:      SpO2: 96% 95% 99% 94%  Weight:      Height:        Intake/Output Summary (Last 24 hours) at 08/09/2017 1656 Last data filed at 08/09/2017 1600 Gross per 24 hour  Intake 2477.5 ml  Output 1225 ml  Net 1252.5 ml   Filed Weights   08/06/17 1000 08/07/17 0500 08/08/17 0500  Weight: 69.3 kg (152 lb 12.5 oz) 68.4 kg (150 lb 12.7 oz) 68.2 kg (150 lb 5.7 oz)    Examination: General exam: Alert, awake, oriented x 2; slightly agitated and asking to have her diet advanced.  No fever, no chest pain, no shortness of breath. Respiratory system: Clear to auscultation. Respiratory effort normal. Cardiovascular system:RRR. No murmurs, rubs,  gallops. Gastrointestinal system: Abdomen is nondistended, soft and nontender. No organomegaly or masses felt. Normal bowel sounds heard. Central nervous system: Alert and oriented. No focal neurological deficits. Extremities: No C/C/E, +pedal pulses Skin: No rashes, lesions or ulcers Psychiatry: Overall judgment and  insight appears to be in pain care given ongoing changes in her mentation.  Slight agitation appreciated.     Data Reviewed: I have personally reviewed following labs and imaging studies  CBC: Recent Labs  Lab 08/06/17 0503 08/07/17 0412 08/08/17 0544 08/09/17 0406  WBC 5.5 3.7* 4.3 3.6*  NEUTROABS 2.8  --   --   --   HGB 12.4 12.0 12.7 11.5*  HCT 37.1 36.7 38.7 36.1  MCV 106.0* 105.8* 105.4* 107.1*  PLT 31* 28* 39* 43*   Basic Metabolic Panel: Recent Labs  Lab 08/06/17 0503 08/06/17 0657 08/07/17 0412 08/08/17 0544 08/09/17 0406  NA 137  --  142 146* 150*  K 4.0  --  4.5 4.0 3.3*  CL 103  --  109 111 114*  CO2 24  --  26 27 30   GLUCOSE 90  --  161* 160* 156*  BUN 30*  --  29* 25* 27*  CREATININE 1.73*  --  0.76 0.52 0.51  CALCIUM 8.8*  --  9.3 10.4* 9.8  MG  --  2.2  --   --   --   PHOS  --  4.9*  --   --   --    GFR: Estimated Creatinine Clearance: 77.2 mL/min (by C-G formula based on SCr of 0.51 mg/dL).   Liver Function Tests: Recent Labs  Lab 08/06/17 0503 08/07/17 0412 08/09/17 0406  AST 311* 216* 92*  ALT 136* 110* 78*  ALKPHOS 50 51 39  BILITOT 1.0 1.0 0.9  PROT 7.2 6.5 6.2*  ALBUMIN 3.4* 3.0* 2.7*   Recent Labs  Lab 08/06/17 0511  LIPASE 45   Recent Labs  Lab 08/06/17 0511 08/07/17 0412 08/08/17 0544  AMMONIA 87* 118* 35   Coagulation Profile: Recent Labs  Lab 08/06/17 0511  INR 1.21    Recent Labs  Lab 08/06/17 0545 08/06/17 0904  GLUCAP 78 112*    Recent Results (from the past 240 hour(s))  MRSA PCR Screening     Status: None   Collection Time: 08/06/17  9:55 AM  Result Value Ref Range Status   MRSA by PCR NEGATIVE NEGATIVE Final    Comment:        The GeneXpert MRSA Assay (FDA approved for NASAL specimens only), is one component of a comprehensive MRSA colonization surveillance program. It is not intended to diagnose MRSA infection nor to guide or monitor treatment for MRSA infections. Performed at [facility], [address], [place], [place] [address]     Radiology Studies: No results found.  Scheduled Meds: . dexamethasone  10 mg Intravenous Q6H  . divalproex  500 mg Oral Q12H  . lactulose  30 g Per Tube TID  . levothyroxine  12.5 mcg Intravenous Gross  . mouth rinse  15 mL Mouth Rinse BID  . risperiDONE  2 mg Oral QHS   Continuous Infusions: . dextrose 5 % with KCl 20 mEq / L 20 mEq (08/09/17 1158)     LOS: 2 days    Time spent: 30 minutes    Barton Dubois, MD [facility] Pager [phone]  If 7PM-7AM, please contact night-coverage [email or web address] Password [facility] 08/09/2017, 4:56 PM

## 2017-08-10 DIAGNOSIS — I10 Essential (primary) hypertension: Secondary | ICD-10-CM

## 2017-08-10 DIAGNOSIS — N179 Acute kidney failure, unspecified: Secondary | ICD-10-CM

## 2017-08-10 DIAGNOSIS — E870 Hyperosmolality and hypernatremia: Secondary | ICD-10-CM

## 2017-08-10 LAB — CBC
HEMATOCRIT: 38.7 % (ref 36.0–46.0)
HEMOGLOBIN: 12.8 g/dL (ref 12.0–15.0)
MCH: 34.5 pg — AB (ref 26.0–34.0)
MCHC: 33.1 g/dL (ref 30.0–36.0)
MCV: 104.3 fL — AB (ref 78.0–100.0)
Platelets: 50 10*3/uL — ABNORMAL LOW (ref 150–400)
RBC: 3.71 MIL/uL — AB (ref 3.87–5.11)
RDW: 14.2 % (ref 11.5–15.5)
WBC: 5.2 10*3/uL (ref 4.0–10.5)

## 2017-08-10 LAB — BASIC METABOLIC PANEL
ANION GAP: 9 (ref 5–15)
BUN: 21 mg/dL — ABNORMAL HIGH (ref 6–20)
CHLORIDE: 104 mmol/L (ref 98–111)
CO2: 27 mmol/L (ref 22–32)
Calcium: 8.9 mg/dL (ref 8.9–10.3)
Creatinine, Ser: 0.47 mg/dL (ref 0.44–1.00)
GFR calc Af Amer: >60 mL/min (ref >60)
GFR calc non Af Amer: >60 mL/min (ref >60)
GLUCOSE: 169 mg/dL — AB (ref 70–99)
POTASSIUM: 3.4 mmol/L — AB (ref 3.5–5.1)
Sodium: 140 mmol/L (ref 135–145)

## 2017-08-10 LAB — MAGNESIUM: Magnesium: 1.7 mg/dL (ref 1.7–2.4)

## 2017-08-10 MED ORDER — LACTULOSE 10 GM/15ML PO SOLN: 30.0000 g | Freq: Two times a day (BID) | ORAL | 1 refills | Status: DC

## 2017-08-10 MED ORDER — DEXAMETHASONE SODIUM PHOSPHATE 10 MG/ML IJ SOLN
INTRAMUSCULAR | Status: AC
  Filled 2017-08-10: qty 1

## 2017-08-10 MED ORDER — PANTOPRAZOLE SODIUM 20 MG PO TBEC: 20.0000 mg | DELAYED_RELEASE_TABLET | Freq: Every day | ORAL | 1 refills | Status: DC

## 2017-08-10 NOTE — Discharge Summary (Addendum)
Physician Discharge Summary  Rachel Gross OZD:[id number] DOB: Jun 15, 1965 DOA: 08/06/2017  PCP: Celene Squibb, MD  Admit date: 08/06/2017 Discharge date: 08/10/2017  Time spent: 35 minutes  Recommendations for Outpatient Follow-up:  1. Repeat CMET to follow electrolytes, liver function test and renal function. 2. Reassess blood pressure and further adjust antihypertensive regimen as needed   Discharge Diagnoses:  Principal Problem:   Acute encephalopathy Active Problems:   Idiopathic thrombocytopenic purpura (ITP) (HCC)   Hyperammonemia (HCC)   Dehydration   COPD (chronic obstructive pulmonary disease) (HCC)   Transaminitis   Hypernatremia   Essential hypertension AKI, pre-renal azotemia  Discharge Condition: Stable and improved.  Patient discharged home with instruction to follow-up with PCP in 10 days.  Diet recommendation: Heart healthy.  Filed Weights   08/06/17 1000 08/07/17 0500 08/08/17 0500  Weight: 69.3 kg (152 lb 12.5 oz) 68.4 kg (150 lb 12.7 oz) 68.2 kg (150 lb 5.7 oz)    History of present illness:  As per H&P written by Dr. Olevia Bowens on 08/06/17 52 y.o. female with medical history significant of alcoholic hepatitis with cystitis, cervical dysplasia, history of HPV infection, history of pneumonia, COPD/emphysema, tobacco use, bipolar disorder who is brought to the emergency department via EMS after her father noticed that she was not waking up after being very somnolent for the past 2 days.  Apparently, the patient went to the dentist on Friday.  She was given an antibiotic and opioid analgesic, which she first took on Friday evening.  This was the last time that her father saw her acting out her baseline.  She was somnolent on Saturday and has been progressively more somnolent since then.  She has not eaten or had much to drink since then.  To her father's knowledge, she has not had a fever, cough, nausea, vomiting, diarrhea or complaints of urinary symptoms.  He found her  on the floor, which is carpeted, yesterday, but does not think that she sustained in the injuries and is not sure if she just slid down or fell from bed.  He believes that she has not taken her medications since Friday.  He is unable to provide further history.  Hospital Course:  1-Acute multifactorial encephalopathy: In the presence of hyperammonemia in a patient with underlying liver disease, use of psychotropic medications and the use of narcotics. -After receiving treatment with lactulose and holding psychotropic agents patient mentation is back to normal. -Will discharge on lactulose 30 g twice a day and adjusted dose of Depakote and Risperdal. -No narcotics or other sedative agents. -Patient advised to keep herself well-hydrated -encourage to keep herself alcohol free.  2-hx of ITP -improved after 4 doses of steroids -continue outpatient follow up with Hem/Onc -no active bleeding  3-transaminitis  -in the setting of alcohol hepatitis -trending down and improved -advise to stop alcohol and to minimize/avoid hepatotoxic drugs  4-dehydration and hypernatremia  -resolved with IVF's -advise to keep herself well hydrated   5-COPD -continue home use of inhaler therapy   6-Alcohol abuse -cessation counseling provided -patient reproted not to be drinking much -encourage to keep herself alcohol free.   7-hypertension -Blood pressure has remained stable and no requiring any medication -Her metoprolol has been discontinued as she was bradycardic throughout this hospitalization intermittently. -Reassess vital signs and further adjust antihypertensive regimen as needed.  8-AKI: pre-renal in nature -Cr 1.73 on admission; improved and back to normal after IVF's resuscitation -advised to keep herself well hydrated and to avoid NSAID's.  Procedures:  See below for x-ray reports   Consultations:  Hematology curbside by Dr. Manuella Ghazi  Discharge Exam: Vitals:   08/10/17 1300 08/10/17  1600  BP: 102/64 114/66  Pulse: 71 (!) 50  Resp: 15 15  Temp:    SpO2: 97% 99%    General: Alert, awake and oriented x3, denying any chest pain, no shortness of breath, no nausea, no vomiting.  Patient eating well and feeling good. Cardiovascular: S1 and S2, no rubs, no gallops, no murmurs Respiratory: Good air movement bilaterally, no wheezing, positive for scattered rhonchi but no crackles. Abdomen: Soft, nontender, nondistended, positive bowel sounds. Extremities: No edema, no cyanosis, no clubbing.  Discharge Instructions   Discharge Instructions    Diet - low sodium heart healthy   Complete by:  As directed    Discharge instructions   Complete by:  As directed    Keep yourself well-hydrated Take medication as prescribed Arrange follow-up with PCP in 10 days.     Allergies as of 08/10/2017      Reactions   Fish Allergy Nausea Only      Medication List    STOP taking these medications   acetaminophen-codeine 300-30 MG tablet Commonly known as:  TYLENOL #3   amoxicillin 500 MG capsule Commonly known as:  AMOXIL   ibuprofen 800 MG tablet Commonly known as:  ADVIL,MOTRIN   metoprolol succinate 25 MG 24 hr tablet Commonly known as:  TOPROL-XL     TAKE these medications   divalproex 500 MG DR tablet Commonly known as:  DEPAKOTE Take 500 mg by mouth 2 (two) times daily.   lactulose 10 GM/15ML solution Commonly known as:  CHRONULAC Take 45 mLs (30 g total) by mouth 2 (two) times daily.   levothyroxine 25 MCG tablet Commonly known as:  SYNTHROID, LEVOTHROID Take 25 mcg by mouth daily before breakfast.   pantoprazole 20 MG tablet Commonly known as:  PROTONIX Take 1 tablet (20 mg total) by mouth daily.   risperiDONE 2 MG tablet Commonly known as:  RISPERDAL Take 2 mg by mouth at bedtime.      Allergies  Allergen Reactions  . Fish Allergy Nausea Only   Follow-up Information    Celene Squibb, MD. Schedule an appointment as soon as possible for a visit  in 10 day(s).   Specialty:  Internal Medicine Contact information: [place] [address] [phone]           The results of significant diagnostics from this hospitalization (including imaging, microbiology, ancillary and laboratory) are listed below for reference.    Significant Diagnostic Studies: Ct Head Wo Contrast  Result Date: 08/06/2017 CLINICAL DATA:  Altered mental status. EXAM: CT HEAD WITHOUT CONTRAST TECHNIQUE: Contiguous axial images were obtained from the base of the skull through the vertex without intravenous contrast. COMPARISON:  None. FINDINGS: Brain: Generalized atrophy, slightly advanced for age. No intracranial hemorrhage, mass effect, or midline shift. No hydrocephalus. The basilar cisterns are patent. No evidence of territorial infarct or acute ischemia. No extra-axial or intracranial fluid collection. Vascular: No hyperdense vessel. Skull: No fracture or focal lesion. Sinuses/Orbits: Bilateral cataract resection.  No acute finding. Other: None. IMPRESSION: 1.  No acute intracranial abnormality. 2. Generalized atrophy, mildly advanced for age. Electronically Signed   By: Jeb Levering M.D.   On: 08/06/2017 06:15   Dg Chest Port 1 View  Result Date: 08/07/2017 CLINICAL DATA:  Esophagogastric tube placement. EXAM: PORTABLE CHEST 1 VIEW COMPARISON:  Chest x-ray of August 06, 2017 FINDINGS: The esophagogastric tube has its tip and proximal port below the left hemidiaphragm. The tip is in the cardia with the proximal port more distal in the body. IMPRESSION: The nasogastric tube tip and proximal port project below the GE junction in the stomach. Electronically Signed   By: David  Martinique M.D.   On: 08/07/2017 10:43   Dg Chest Port 1 View  Result Date: 08/06/2017 CLINICAL DATA:  Mental status change. EXAM: PORTABLE CHEST 1 VIEW COMPARISON:  None. FINDINGS: Low lung volumes with bronchovascular crowding. The heart is normal in size. Diffuse peribronchial  thickening. No confluent airspace disease. Questionable blunting of right costophrenic angle. No pneumothorax. IMPRESSION: 1. Hypoventilatory chest with bronchovascular crowding. 2. Diffuse peribronchial thickening, which may reflect bronchitis or pulmonary edema Electronically Signed   By: Jeb Levering M.D.   On: 08/06/2017 06:24    Microbiology: Recent Results (from the past 240 hour(s))  MRSA PCR Screening     Status: None   Collection Time: 08/06/17  9:55 AM  Result Value Ref Range Status   MRSA by PCR NEGATIVE NEGATIVE Final    Comment:        The GeneXpert MRSA Assay (FDA approved for NASAL specimens only), is one component of a comprehensive MRSA colonization surveillance program. It is not intended to diagnose MRSA infection nor to guide or monitor treatment for MRSA infections. Performed at [facility], [address], [place], [place] [address]      Labs: Basic Metabolic Panel: Recent Labs  Lab 08/06/17 0503 08/06/17 0657 08/07/17 0412 08/08/17 0544 08/09/17 0406 08/10/17 0650  NA 137  --  142 146* 150* 140  K 4.0  --  4.5 4.0 3.3* 3.4*  CL 103  --  109 111 114* 104  CO2 24  --  26 27 30 27   GLUCOSE 90  --  161* 160* 156* 169*  BUN 30*  --  29* 25* 27* 21*  CREATININE 1.73*  --  0.76 0.52 0.51 0.47  CALCIUM 8.8*  --  9.3 10.4* 9.8 8.9  MG  --  2.2  --   --   --  1.7  PHOS  --  4.9*  --   --   --   --    Liver Function Tests: Recent Labs  Lab 08/06/17 0503 08/07/17 0412 08/09/17 0406  AST 311* 216* 92*  ALT 136* 110* 78*  ALKPHOS 50 51 39  BILITOT 1.0 1.0 0.9  PROT 7.2 6.5 6.2*  ALBUMIN 3.4* 3.0* 2.7*   Recent Labs  Lab 08/06/17 0511  LIPASE 45   Recent Labs  Lab 08/06/17 0511 08/07/17 0412 08/08/17 0544  AMMONIA 87* 118* 35   CBC: Recent Labs  Lab 08/06/17 0503 08/07/17 0412 08/08/17 0544 08/09/17 0406 08/10/17 0650  WBC 5.5 3.7* 4.3 3.6* 5.2  NEUTROABS 2.8  --   --   --   --   HGB 12.4 12.0 12.7 11.5* 12.8  HCT 37.1 36.7  38.7 36.1 38.7  MCV 106.0* 105.8* 105.4* 107.1* 104.3*  PLT 31* 28* 39* 43* 50*   CBG: Recent Labs  Lab 08/06/17 0545 08/06/17 0904  GLUCAP 78 112*    Signed:  Barton Dubois MD.  [facility] 08/10/2017, 5:19 PM

## 2017-08-24 ENCOUNTER — Inpatient Hospital Stay (HOSPITAL_COMMUNITY): Payer: Medicaid Other | Attending: Hematology

## 2017-08-24 DIAGNOSIS — D696 Thrombocytopenia, unspecified: Secondary | ICD-10-CM | POA: Diagnosis not present

## 2017-08-24 DIAGNOSIS — D693 Immune thrombocytopenic purpura: Secondary | ICD-10-CM

## 2017-08-24 LAB — COMPREHENSIVE METABOLIC PANEL
ALT: 42 U/L (ref 0–44)
AST: 68 U/L — ABNORMAL HIGH (ref 15–41)
Albumin: 3.3 g/dL — ABNORMAL LOW (ref 3.5–5.0)
Alkaline Phosphatase: 50 U/L (ref 38–126)
Anion gap: 8 (ref 5–15)
BILIRUBIN TOTAL: 0.7 mg/dL (ref 0.3–1.2)
BUN: 8 mg/dL (ref 6–20)
CO2: 29 mmol/L (ref 22–32)
CREATININE: 0.53 mg/dL (ref 0.44–1.00)
Calcium: 9 mg/dL (ref 8.9–10.3)
Chloride: 100 mmol/L (ref 98–111)
Glucose, Bld: 81 mg/dL (ref 70–99)
POTASSIUM: 3.9 mmol/L (ref 3.5–5.1)
Sodium: 137 mmol/L (ref 135–145)
TOTAL PROTEIN: 7.1 g/dL (ref 6.5–8.1)

## 2017-08-24 LAB — CBC WITH DIFFERENTIAL/PLATELET
BASOS PCT: 0 %
Basophils Absolute: 0 10*3/uL (ref 0.0–0.1)
Eosinophils Absolute: 0 10*3/uL (ref 0.0–0.7)
Eosinophils Relative: 0 %
HEMATOCRIT: 36 % (ref 36.0–46.0)
Hemoglobin: 12 g/dL (ref 12.0–15.0)
LYMPHS PCT: 28 %
Lymphs Abs: 2.4 10*3/uL (ref 0.7–4.0)
MCH: 34.5 pg — ABNORMAL HIGH (ref 26.0–34.0)
MCHC: 33.3 g/dL (ref 30.0–36.0)
MCV: 103.4 fL — AB (ref 78.0–100.0)
MONO ABS: 1 10*3/uL (ref 0.1–1.0)
MONOS PCT: 12 %
NEUTROS ABS: 5.1 10*3/uL (ref 1.7–7.7)
Neutrophils Relative %: 60 %
PLATELETS: 62 10*3/uL — AB (ref 150–400)
RBC: 3.48 MIL/uL — ABNORMAL LOW (ref 3.87–5.11)
RDW: 14.5 % (ref 11.5–15.5)
WBC: 8.5 10*3/uL (ref 4.0–10.5)

## 2017-08-24 LAB — LACTATE DEHYDROGENASE: LDH: 185 U/L (ref 98–192)

## 2017-08-31 ENCOUNTER — Other Ambulatory Visit: Payer: Self-pay

## 2017-08-31 ENCOUNTER — Inpatient Hospital Stay (HOSPITAL_COMMUNITY): Payer: Medicaid Other | Attending: Hematology | Admitting: Hematology

## 2017-08-31 ENCOUNTER — Encounter (HOSPITAL_COMMUNITY): Payer: Self-pay | Admitting: Hematology

## 2017-08-31 VITALS — BP 87/72 | HR 83 | Temp 98.2°F | Resp 16 | Wt 149.0 lb

## 2017-08-31 DIAGNOSIS — D693 Immune thrombocytopenic purpura: Secondary | ICD-10-CM | POA: Diagnosis not present

## 2017-08-31 DIAGNOSIS — B1920 Unspecified viral hepatitis C without hepatic coma: Secondary | ICD-10-CM

## 2017-08-31 DIAGNOSIS — D696 Thrombocytopenia, unspecified: Secondary | ICD-10-CM

## 2017-08-31 NOTE — Patient Instructions (Signed)
[place] at [facility] Discharge Instructions  Please follow up in 3 months and have labs drawn a few days prior   Thank you for choosing [place] at [facility] to provide your oncology and hematology care.  To afford each patient quality time with our provider, please arrive at least 15 minutes before your scheduled appointment time.   If you have a lab appointment with the [facility] please come in thru the  Main Entrance and check in at the main information desk  You need to re-schedule your appointment should you arrive 10 or more minutes late.  We strive to give you quality time with our providers, and arriving late affects you and other patients whose appointments are after yours.  Also, if you no show three or more times for appointments you may be dismissed from the clinic at the providers discretion.     Again, thank you for choosing [facility].  Our hope is that these requests will decrease the amount of time that you wait before being seen by our physicians.       _____________________________________________________________  Should you have questions after your visit to [facility], please contact our office at [phone] between the hours of 8:00 a.m. and 4:30 p.m.  Voicemails left after 4:00 p.m. will not be returned until the following business day.  For prescription refill requests, have your pharmacy contact our office and allow 72 hours.    Cancer Center Support Programs:   > Cancer Support Group  2nd Tuesday of the month 1pm-2pm, Journey Room

## 2017-08-31 NOTE — Progress Notes (Signed)
[facility] [address] [place] [address]   CLINIC:  Medical Oncology/Hematology  PCP:  Celene Squibb, MD [place] [address] [phone]   REASON FOR VISIT:  Follow-up for thrombocytopenia  CURRENT THERAPY: observation   INTERVAL HISTORY:  [name] 52 y.o. female returns for routine follow-up for thrombocytopenia. Patient is here today with her father. She is doing well. She was recently in the hospital for an infection. She has recovered and is starting to feel better. She does have bilateral lower extremity edema usually at the end of the day.Patients energy level and appetite are around 50%. Denies any bleeding or easy bruising. She is doing better since moving in with her father. She has an appointment on August 12 to follow up for her hepatitis.    REVIEW OF SYSTEMS:  Review of Systems  Constitutional: Positive for fatigue.  HENT:  Negative.   Eyes: Negative.   Respiratory: Negative.   Cardiovascular: Positive for leg swelling.  Gastrointestinal: Negative.   Endocrine: Negative.   Genitourinary: Negative.    Skin: Negative.   Neurological: Positive for dizziness and extremity weakness.  Hematological: Negative.   Psychiatric/Behavioral: Negative.      PAST MEDICAL/SURGICAL HISTORY:  Past Medical History:  Diagnosis Date  . Alcoholic hepatitis without ascites   . 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 COPD (chronic obstructive pulmonary disease) ([place])   . Emphysema lung ([place])   . History of HPV infection   . History of pneumonia   . Smoker    Past Surgical History:  Procedure Laterality Date  . CESAREAN SECTION       SOCIAL HISTORY:  Social History   Socioeconomic History  . Marital status: Single    Spouse name: Not on file  . Number of children: Not on file  . Years of education: Not on file  . Highest education level: Not on file  Occupational History  . Not on file   Social Needs  . Financial resource strain: Not on file  . Food insecurity:    Worry: Not on file    Inability: Not on file  . Transportation needs:    Medical: Not on file    Non-medical: Not on file  Tobacco Use  . Smoking status: Current Some Day Smoker    Packs/day: 1.00    Types: Cigarettes  . Smokeless tobacco: Never Used  Substance and Sexual Activity  . Alcohol use: Not Currently  . Drug use: Not Currently  . Sexual activity: Not on file  Lifestyle  . Physical activity:    Days per week: Not on file    Minutes per session: Not on file  . Stress: Not on file  Relationships  . Social connections:    Talks on phone: Not on file    Gets together: Not on file    Attends religious service: Not on file    Active member of club or organization: Not on file    Attends meetings of clubs or organizations: Not on file    Relationship status: Not on file  . Intimate partner violence:    Fear of current or ex partner: Not on file    Emotionally abused: Not on file    Physically abused: Not on file    Forced sexual activity: Not on file  Other Topics Concern  . Not on file  Social History Narrative  . Not on file  FAMILY HISTORY:  Family History  Problem Relation Age of Onset  . Congenital heart disease Mother   . Bipolar disorder Mother   . Prostate cancer Brother   . Alzheimer's disease Paternal Grandmother     CURRENT MEDICATIONS:  Outpatient Encounter Medications as of 08/31/2017  Medication Sig  . divalproex (DEPAKOTE) 500 MG DR tablet Take 500 mg by mouth 2 (two) times daily.  Marland Kitchen lactulose (CHRONULAC) 10 GM/15ML solution Take 45 mLs (30 g total) by mouth 2 (two) times daily.  Marland Kitchen levothyroxine (SYNTHROID, LEVOTHROID) 25 MCG tablet Take 25 mcg by mouth daily before breakfast.  . pantoprazole (PROTONIX) 20 MG tablet Take 1 tablet (20 mg total) by mouth daily.  . risperiDONE (RISPERDAL) 2 MG tablet Take 2 mg by mouth at bedtime.  . sulfamethoxazole-trimethoprim  (BACTRIM DS,SEPTRA DS) 800-160 MG tablet TK 1 T PO  BID   No facility-administered encounter medications on file as of 08/31/2017.     ALLERGIES:  Allergies  Allergen Reactions  . Fish Allergy Nausea Only     PHYSICAL EXAM:  ECOG Performance status: 1  Vitals:   08/31/17 1503  BP: (!) 87/72  Pulse: 83  Resp: 16  Temp: 98.2 F (36.8 C)  SpO2: 99%   Filed Weights   08/31/17 1503  Weight: 149 lb (67.6 kg)    Physical Exam  Constitutional: She is oriented to person, place, and time. She appears well-developed and well-nourished.  Cardiovascular: Normal rate, regular rhythm and normal heart sounds.  Pulmonary/Chest: Effort normal and breath sounds normal.  Neurological: She is alert and oriented to person, place, and time.  Skin: Skin is warm and dry.     LABORATORY DATA:  I have reviewed the labs as listed.  CBC    Component Value Date/Time   WBC 8.5 08/24/2017 1317   RBC 3.48 (L) 08/24/2017 1317   HGB 12.0 08/24/2017 1317   HCT 36.0 08/24/2017 1317   PLT 62 (L) 08/24/2017 1317   MCV 103.4 (H) 08/24/2017 1317   MCH 34.5 (H) 08/24/2017 1317   MCHC 33.3 08/24/2017 1317   RDW 14.5 08/24/2017 1317   LYMPHSABS 2.4 08/24/2017 1317   MONOABS 1.0 08/24/2017 1317   EOSABS 0.0 08/24/2017 1317   BASOSABS 0.0 08/24/2017 1317   CMP Latest Ref Rng & Units 08/24/2017 08/10/2017 08/09/2017  Glucose 70 - 99 mg/dL 81 169(H) 156(H)  BUN 6 - 20 mg/dL 8 21(H) 27(H)  Creatinine 0.44 - 1.00 mg/dL 0.53 0.47 0.51  Sodium 135 - 145 mmol/L 137 140 150(H)  Potassium 3.5 - 5.1 mmol/L 3.9 3.4(L) 3.3(L)  Chloride 98 - 111 mmol/L 100 104 114(H)  CO2 22 - 32 mmol/L 29 27 30   Calcium 8.9 - 10.3 mg/dL 9.0 8.9 9.8  Total Protein 6.5 - 8.1 g/dL 7.1 - 6.2(L)  Total Bilirubin 0.3 - 1.2 mg/dL 0.7 - 0.9  Alkaline Phos 38 - 126 U/L 50 - 39  AST 15 - 41 U/L 68(H) - 92(H)  ALT 0 - 44 U/L 42 - 78(H)        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 Juel Burrow office from April 2019 shows platelet count of 49.  I have reviewed medical records from [place], her platelet count was 127.  We have done work-up for thrombocytopenia.  Her platelet count dropped to 33.  Lupus anticoagulant and HIV test were negative. -Ultrasound of the abdomen on 05/31/2017 shows hepatic cirrhosis,  normal sized spleen. - We have given a trial of pulse dexamethasone 40 mg for 4 days, on 06/14/2017.  Patient tolerated dexamethasone very well.  She felt better because of her energy level improvement.  Her platelet count improved to 81 from 33. - She was recently hospitalized in July with metabolic encephalopathy with elevated ammonia levels.  This happened after she was being treated for her teeth infection with amoxicillin and codeine.  Platelet count dropped to 28 while she was inpatient.  She received dexamethasone for 4 days which resulted in improvement in her platelet count. - We have checked her blood count last week.  Her platelet count is 62.  No active intervention necessary.  We will see her back in 3 months for follow-up.  2.  Recurrent pneumonias: She reports hospitalization with recurrent pneumonias in the last 3 years.  I have checked her immunoglobulins which are normal.  3.  Hepatitis C: She has an appointment to see hepatology for treatment of hep C on August 12.       Orders placed this encounter:  Orders Placed This Encounter  Procedures  . CBC with Differential/Platelet  . Comprehensive metabolic panel  . Lactate dehydrogenase      Derek Jack, MD [place] [phone]

## 2017-08-31 NOTE — Assessment & Plan Note (Signed)
1.  Immune thrombocytopenia: -Recent blood work at Dr. Juel Burrow office from April 2019 shows platelet count of 49.  I have reviewed medical records from [place], her platelet count was 127.  We have done work-up for thrombocytopenia.  Her platelet count dropped to 33.  Lupus anticoagulant and HIV test were negative. -Ultrasound of the abdomen on 05/31/2017 shows hepatic cirrhosis, normal sized spleen. - We have given a trial of pulse dexamethasone 40 mg for 4 days, on 06/14/2017.  Patient tolerated dexamethasone very well.  She felt better because of her energy level improvement.  Her platelet count improved to 81 from 33. - She was recently hospitalized in July with metabolic encephalopathy with elevated ammonia levels.  This happened after she was being treated for her teeth infection with amoxicillin and codeine.  Platelet count dropped to 28 while she was inpatient.  She received dexamethasone for 4 days which resulted in improvement in her platelet count. - We have checked her blood count last week.  Her platelet count is 62.  No active intervention necessary.  We will see her back in 3 months for follow-up.  2.  Recurrent pneumonias: She reports hospitalization with recurrent pneumonias in the last 3 years.  I have checked her immunoglobulins which are normal.  3.  Hepatitis C: She has an appointment to see hepatology for treatment of hep C on August 12.

## 2017-09-10 ENCOUNTER — Encounter: Payer: Self-pay | Admitting: *Deleted

## 2017-09-10 ENCOUNTER — Ambulatory Visit: Payer: Medicaid Other | Admitting: Nurse Practitioner

## 2017-09-10 ENCOUNTER — Encounter: Payer: Self-pay | Admitting: Nurse Practitioner

## 2017-09-10 DIAGNOSIS — D696 Thrombocytopenia, unspecified: Secondary | ICD-10-CM

## 2017-09-10 DIAGNOSIS — R935 Abnormal findings on diagnostic imaging of other abdominal regions, including retroperitoneum: Secondary | ICD-10-CM

## 2017-09-10 DIAGNOSIS — R768 Other specified abnormal immunological findings in serum: Secondary | ICD-10-CM | POA: Diagnosis not present

## 2017-09-10 NOTE — Progress Notes (Signed)
Primary Care Physician:  Celene Squibb, MD Primary Gastroenterologist:  Dr. Gala Romney  Chief Complaint  Patient presents with  . Hepatitis C    HPI:   Rachel Gross is a 52 y.o. female who presents on referral from the [facility] for evaluation of hepatitis C positive antibody. Review of other office notes available to US show he was seen by hematology/oncology on 06/28/2017 for thrombocytopenia noted to be 49,000 with an elevated AST/ALT at 183/87.  Past medical history including alcoholism, alcoholic hepatitis, hepatitis C.  Also with documented history of methamphetamine use where he tested positive for amphetamine and methamphetamine on 02/01/2017.  CT dated 08/17/2015 found fatty infiltration of the liver and hepatomegaly.  However, an ultrasound of the abdomen dated 05/31/2017 showed hepatic cirrhosis but normal size spleen.  She was diagnosed with immune thrombocytopenia and given pulse dose dexamethasone with recommended repeat CBC.  Repeat CBC completed after her steroid course found and increased the count from 33,000 to 81,000.  This recent CMP dated 08/24/2017 found elevated AST at 68, normal ALT, alkaline phosphatase, bilirubin, creatinine.  Most recent INR 1.21.  He was recently admitted to the hospital from May 2019 through 08/10/2017 for acute encephalopathy.  She was brought to the emergency department by her father who noted her being very somnolent for the previous 2 days.  She is recently been given antibiotics and opioids after a dentist visit.  She was found to have acute multifactorial encephalopathy in the presence of hyper ammonemia in the presence of underlying liver disease, use of psychotropic medications, and use of narcotics.  She was given lactulose and psychotropic agents were held and her mentation returned to normal.  She was discharged on lactulose 30 g twice a day.  Also recommended to stop alcohol and minimize/avoid hepatotoxic drugs.  No history of colonoscopy or  endoscopy in our system.  Today she states she's doing ok. See's hematology for thrombocytopenia. States when her platelets are low she can't hardly walk. She states she's had HCV for a couple years, was told when she was at [employer] in [place]" and was still drinking and she needed to be sober for 6 months before they would treat her. She has now not had any ETOH since 01/2016. She currently lives at her dad's. She isn't sure when she caught Hep C. Has right-sided abdominal pain with cough or sneezing. Denies N/V, hematochezia, melena, fever, chills, unintentional weight loss. Denies yellowing of skin/eyes, darkened urine, acute episodic confusion. Has chronic tremors for head injuries. See's a psychiatrist currently. Denies chest pain, dyspnea, dizziness, lightheadedness, syncope, near syncope. Denies any other upper or lower GI symptoms.  Not taking Lactulose currently.  Hepatitis C Risk Factors:  Birth cohort ([place]): Yes IV drug use: Yes; last time in the 1990s Tattoos: Yes, done at a tattoo parlor in 1990's Blood product transfusion: No HC worker: No Hemodialysis: No Maternal infection: No   Past Medical History:  Diagnosis Date  . Alcoholic hepatitis without ascites   . 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 COPD (chronic obstructive pulmonary disease) ([place])   . Emphysema lung ([place])   . History of HPV infection   . History of pneumonia   . Smoker     Past Surgical History:  Procedure Laterality Date  . CESAREAN SECTION      Current Outpatient Medications  Medication Sig Dispense Refill  . divalproex (DEPAKOTE) 500 MG DR tablet Take 500 mg by  mouth 2 (two) times daily.    . risperiDONE (RISPERDAL) 2 MG tablet Take 2 mg by mouth at bedtime.    Marland Kitchen lactulose (CHRONULAC) 10 GM/15ML solution Take 45 mLs (30 g total) by mouth 2 (two) times daily. (Patient not taking: Reported on 09/10/2017) 240 mL 1  . levothyroxine (SYNTHROID, LEVOTHROID) 25 MCG tablet  Take 25 mcg by mouth daily before breakfast.    . pantoprazole (PROTONIX) 20 MG tablet Take 1 tablet (20 mg total) by mouth daily. (Patient not taking: Reported on 09/10/2017) 30 tablet 1  . sulfamethoxazole-trimethoprim (BACTRIM DS,SEPTRA DS) 800-160 MG tablet TK 1 T PO  BID  0   No current facility-administered medications for this visit.     Allergies as of 09/10/2017 - Review Complete 09/10/2017  Allergen Reaction Noted  . Amoxicillin  09/10/2017  . Fish allergy Nausea Only 05/24/2017  . Penicillins  09/10/2017    Family History  Problem Relation Age of Onset  . Congenital heart disease Mother   . Bipolar disorder Mother   . Prostate cancer Brother   . Alzheimer's disease Paternal Grandmother     Social History   Socioeconomic History  . Marital status: Single    Spouse name: Not on file  . Number of children: Not on file  . Years of education: Not on file  . Highest education level: Not on file  Occupational History  . Not on file  Social Needs  . Financial resource strain: Not on file  . Food insecurity:    Worry: Not on file    Inability: Not on file  . Transportation needs:    Medical: Not on file    Non-medical: Not on file  Tobacco Use  . Smoking status: Current Some Day Smoker    Packs/day: 1.00    Types: Cigarettes  . Smokeless tobacco: Never Used  Substance and Sexual Activity  . Alcohol use: Not Currently  . Drug use: Not Currently  . Sexual activity: Not on file  Lifestyle  . Physical activity:    Days per week: Not on file    Minutes per session: Not on file  . Stress: Not on file  Relationships  . Social connections:    Talks on phone: Not on file    Gets together: Not on file    Attends religious service: Not on file    Active member of club or organization: Not on file    Attends meetings of clubs or organizations: Not on file    Relationship status: Not on file  . Intimate partner violence:    Fear of current or ex partner: Not on file     Emotionally abused: Not on file    Physically abused: Not on file    Forced sexual activity: Not on file  Other Topics Concern  . Not on file  Social History Narrative  . Not on file    Review of Systems: General: Negative for anorexia, weight loss, fever, chills. ENT: Negative for hoarseness, difficulty swallowing. CV: Negative for chest pain, angina, palpitations. Admits lower extremity edema.  Respiratory: Negative for dyspnea at rest, cough, sputum, wheezing.  GI: See history of present illness. MS: Negative for joint pain, low back pain.  Derm: Negative for rash or itching.  Neuro: Negative for memory loss, confusion. Admits chronic tremors related to head injury; sees neurology Endo: Negative for unusual weight change.  Heme: Negative for bruising or bleeding. Allergy: Negative for rash or hives.    Physical  Exam: BP 98/67   Pulse 88   Temp (!) 97.3 F (36.3 C) (Oral)   Ht 5\' 1"  (1.549 m)   Wt 151 lb 3.2 oz (68.6 kg)   BMI 28.57 kg/m  General:   Alert and oriented. Pleasant and cooperative. Well-nourished and well-developed.  Head:  Normocephalic and atraumatic. Eyes:  Without icterus, sclera clear and conjunctiva pink.  Throat/Neck:  Supple, without mass or thyromegaly. Cardiovascular:  S1, S2 present without murmurs appreciated. Extremities without clubbing. Mild 1-2+ bilateral LE pitting edema. Respiratory:  Clear to auscultation bilaterally. No wheezes, rales, or rhonchi. No distress.  Gastrointestinal:  +BS, soft, non-tender and non-distended. Possible 1-2 fingerbreadth hepatomegaly noted; possible mild splenomegaly on exam. No guarding or rebound. No masses appreciated.  Rectal:  Deferred  Musculoskalatal:  Symmetrical without gross deformities. Neurologic:  Alert and oriented x4;  grossly normal neurologically other than R>L intermittent tremors of the hand/wrist. Psych:  Alert and cooperative. Normal mood and affect. Heme/Lymph/Immune: No excessive  bruising noted.    09/10/2017 10:55 AM   Disclaimer: This note was dictated with voice recognition software. Similar sounding words can inadvertently be transcribed and may not be corrected upon review.

## 2017-09-10 NOTE — Patient Instructions (Signed)
1. Have your blood test drawn when you are able to. 2. We will help schedule your ultrasound for you. 3. We will call you with the results. 4. Return for follow-up in 2 months. 5. Call us if you have any questions or concerns.  At [facility] we value your feedback. You may receive a survey about your visit today. Please share your experience as we strive to create trusting relationships with our patients to provide genuine, compassionate, quality care.  It was great to meet you today!  I hope you have a great summer!!

## 2017-09-11 ENCOUNTER — Telehealth: Payer: Self-pay | Admitting: *Deleted

## 2017-09-11 NOTE — Telephone Encounter (Signed)
PA for ultrasound RUQ w/ elastagraphy was denied. I called [employer] and spoke with Sharyn Lull PA intake rep to do a reconsideration. This has been sent for review.  Service order # [id number]

## 2017-09-12 NOTE — Telephone Encounter (Signed)
I had spoke with Rosendo Gros. She was able to get U/S elastography scheduled for 09/17/17 at 10:15am, npo after midnight.  Called patient and made aware. FYI to EG

## 2017-09-12 NOTE — Telephone Encounter (Addendum)
Reconsideration for test was denied. I called patient and made aware u/s is cancelled for now. Aware I will discuss with EG when he returns to the office tomorrow.

## 2017-09-13 ENCOUNTER — Ambulatory Visit (HOSPITAL_COMMUNITY): Payer: Medicaid Other

## 2017-09-13 ENCOUNTER — Telehealth: Payer: Self-pay | Admitting: *Deleted

## 2017-09-13 LAB — CBC WITH DIFFERENTIAL/PLATELET
Basophils Absolute: 22 cells/uL (ref 0–200)
Basophils Relative: 0.5 %
EOS PCT: 1.4 %
Eosinophils Absolute: 60 cells/uL (ref 15–500)
HEMATOCRIT: 35.3 % (ref 35.0–45.0)
Hemoglobin: 12.2 g/dL (ref 11.7–15.5)
Lymphs Abs: 2090 cells/uL (ref 850–3900)
MCH: 34.3 pg — ABNORMAL HIGH (ref 27.0–33.0)
MCHC: 34.6 g/dL (ref 32.0–36.0)
MCV: 99.2 fL (ref 80.0–100.0)
MPV: 10.8 fL (ref 7.5–12.5)
Monocytes Relative: 9.1 %
NEUTROS PCT: 40.4 %
Neutro Abs: 1737 cells/uL (ref 1500–7800)
Platelets: 64 10*3/uL — ABNORMAL LOW (ref 140–400)
RBC: 3.56 10*6/uL — AB (ref 3.80–5.10)
RDW: 12.7 % (ref 11.0–15.0)
TOTAL LYMPHOCYTE: 48.6 %
WBC mixed population: 391 cells/uL (ref 200–950)
WBC: 4.3 10*3/uL (ref 3.8–10.8)

## 2017-09-13 LAB — HEPATITIS C RNA QUANTITATIVE
HCV QUANT LOG: 6.19 {Log_IU}/mL — AB
HCV RNA, PCR, QN: 1540000 IU/mL — ABNORMAL HIGH

## 2017-09-13 LAB — COMPREHENSIVE METABOLIC PANEL
AG Ratio: 1 (calc) (ref 1.0–2.5)
ALKALINE PHOSPHATASE (APISO): 61 U/L (ref 33–130)
ALT: 89 U/L — ABNORMAL HIGH (ref 6–29)
AST: 178 U/L — ABNORMAL HIGH (ref 10–35)
Albumin: 3.5 g/dL — ABNORMAL LOW (ref 3.6–5.1)
BILIRUBIN TOTAL: 0.6 mg/dL (ref 0.2–1.2)
BUN/Creatinine Ratio: 13 (calc) (ref 6–22)
BUN: 6 mg/dL — ABNORMAL LOW (ref 7–25)
CALCIUM: 8.9 mg/dL (ref 8.6–10.4)
CHLORIDE: 101 mmol/L (ref 98–110)
CO2: 27 mmol/L (ref 20–32)
Creat: 0.45 mg/dL — ABNORMAL LOW (ref 0.50–1.05)
GLOBULIN: 3.6 g/dL (ref 1.9–3.7)
Glucose, Bld: 88 mg/dL (ref 65–139)
Potassium: 3.9 mmol/L (ref 3.5–5.3)
Sodium: 135 mmol/L (ref 135–146)
Total Protein: 7.1 g/dL (ref 6.1–8.1)

## 2017-09-13 LAB — HEPATITIS C GENOTYPE

## 2017-09-13 NOTE — Assessment & Plan Note (Signed)
CT exam 08/17/2015 found fatty infiltration of the liver and hepatomegaly.  However, an ultrasound dated 05/31/2017 showed hepatic cirrhosis but normal spleen size.  Possible rapid progression to cirrhosis given her ongoing hepatitis C.  We will recheck ultrasound elastography.  Hepatitis C labs as per above, follow-up in 2 months.

## 2017-09-13 NOTE — Assessment & Plan Note (Signed)
The patient states she was initially diagnosed with hepatitis C about 2 years ago in [place].  She was declined treatment because she was drinking at that time.  She has not had any alcohol since January 2018.  We will check hepatitis C labs including CBC, CMP, RNA, genotype.  We will also check an ultrasound elastography.  We will plan to potentially treat her pending results and insurance requirements.  Follow-up in 2 months.

## 2017-09-13 NOTE — Telephone Encounter (Signed)
Noted, thanks for the help!

## 2017-09-13 NOTE — Telephone Encounter (Signed)
Rachel Gross from [facility] called stating elastography needed PA. I involved Rachel Gross. CPT code [phone] and 91200 on [employer]'s website stated "does not exist" meaning no PA is required. Rachel Gross advised Rachel Gross she was looking at the wrong order than what was scheduled. She advised Rachel Gross she was correct no PA is required.

## 2017-09-13 NOTE — Assessment & Plan Note (Signed)
The patient has thrombus cytopenia and has been seen by hematology and diagnosed with ITP.  She does have hepatitis C and some element of cirrhosis could be contributing to her thrombocytopenia.  We will check CBC as part of her hep C labs.  Also check an ultrasound elastography.  Follow-up in 2 months.

## 2017-09-17 ENCOUNTER — Ambulatory Visit (HOSPITAL_COMMUNITY)
Admission: RE | Admit: 2017-09-17 | Discharge: 2017-09-17 | Disposition: A | Discharge status: Home / Self Care | Payer: Medicaid Other | Source: Ambulatory Visit | Attending: Nurse Practitioner | Admitting: Nurse Practitioner

## 2017-09-17 ENCOUNTER — Telehealth: Payer: Self-pay | Admitting: Internal Medicine

## 2017-09-17 DIAGNOSIS — D696 Thrombocytopenia, unspecified: Secondary | ICD-10-CM

## 2017-09-17 DIAGNOSIS — R935 Abnormal findings on diagnostic imaging of other abdominal regions, including retroperitoneum: Secondary | ICD-10-CM | POA: Diagnosis not present

## 2017-09-17 DIAGNOSIS — R768 Other specified abnormal immunological findings in serum: Secondary | ICD-10-CM | POA: Diagnosis not present

## 2017-09-17 NOTE — Progress Notes (Signed)
cc'd to pcp 

## 2017-09-17 NOTE — Telephone Encounter (Signed)
Pt was calling to speak to MS. She had questions about her [place] that was rescheduled and if her insurance was going to pay for it. [phone]

## 2017-09-17 NOTE — Telephone Encounter (Signed)
I spoke with patient and made aware elastography is covered.

## 2017-09-26 ENCOUNTER — Other Ambulatory Visit: Payer: Self-pay

## 2017-09-26 DIAGNOSIS — K7460 Unspecified cirrhosis of liver: Secondary | ICD-10-CM

## 2017-09-27 LAB — PROTIME-INR
INR: 1.2 — AB
PROTHROMBIN TIME: 13.2 s — AB (ref 9.0–11.5)

## 2017-11-16 ENCOUNTER — Ambulatory Visit: Payer: Medicaid Other | Admitting: Nurse Practitioner

## 2017-11-16 ENCOUNTER — Encounter: Payer: Self-pay | Admitting: Nurse Practitioner

## 2017-11-16 VITALS — BP 110/68 | HR 76 | Temp 97.2°F | Ht 61.0 in | Wt 149.0 lb

## 2017-11-16 DIAGNOSIS — D696 Thrombocytopenia, unspecified: Secondary | ICD-10-CM | POA: Diagnosis not present

## 2017-11-16 DIAGNOSIS — R768 Other specified abnormal immunological findings in serum: Secondary | ICD-10-CM

## 2017-11-16 DIAGNOSIS — K7460 Unspecified cirrhosis of liver: Secondary | ICD-10-CM | POA: Insufficient documentation

## 2017-11-16 NOTE — Patient Instructions (Signed)
1. Have your labs drawn when you are able to. 2. We will call you when we get your results. 3. We will start working on paperwork for treating her hepatitis C. 4. We will call you with further recommendations and instructions for follow-up. 5. Call us if you have any questions or concerns.  At [facility] we value your feedback. You may receive a survey about your visit today. Please share your experience as we strive to create trusting relationships with our patients to provide genuine, compassionate, quality care.  We appreciate your understanding and patience as we review any laboratory studies, imaging, and other diagnostic tests that are ordered as we care for you. Our office policy is 5 business days for review of these results, and any emergent or urgent results are addressed in a timely manner for your best interest. If you do not hear from our office in 1 week, please contact us.   We also encourage the use of MyChart, which contains your medical information for your review as well. If you are not enrolled in this feature, an access code is on this after visit summary for your convenience. Thank you for allowing [place] to be involved in your care.  It was great to see you today!  I hope you have a great Fall!!

## 2017-11-16 NOTE — Assessment & Plan Note (Signed)
The patient has some F3/F4 on ultrasound elastography of the liver.  She previously had hepatic steatosis.  She has had an accelerated course to early cirrhosis in the presence of hepatitis C.  She has had hep C for about 2 to 3 years.  See HPI for details about work-up.  Nothing missing currently has hepatitis B serologies and HIV.  We have contacted her insurance and the preferred option in her situation with compensated cirrhosis is Mavyret.  We will start the paperwork for prior Auth.  We will call with follow-up recommendations.  She will need to be seen at least for labs if not an office visit 4 weeks after initiating treatment.  We will make a final decision to initiate treatment pending her hepatitis B and HIV serologies.  If she is positive for either these she will need complex management by a liver clinic or infectious disease.  Follow-up based on recommendations made after lab results.

## 2017-11-16 NOTE — Assessment & Plan Note (Signed)
The patient is treated for immune thrombocytopenia by hematology.  Her ultrasound showed essentially normal spleen although there is possibly a minor element of portal hypertension in the setting of cirrhosis with hepatitis C infection and previous alcoholism.  She is a bleeding risk because of her thrombocytopenia.  Her platelets have increased on prednisone and if begun to come down again.  Thrombocytopenia management per hematology.

## 2017-11-16 NOTE — Progress Notes (Signed)
Referring Provider: Celene Squibb, MD Primary Care Physician:  Celene Squibb, MD Primary GI:  Dr. Gala Romney  Chief Complaint  Patient presents with  . hcv    f/u.    HPI:   Rachel Gross is a 52 y.o. female who presents for follow-up on hepatitis C.  The patient was last seen in our office 09/10/2017 for hepatitis C antibody positive, abnormal abdominal ultrasound, thrombocytopenia.  At that time the patient had [facility].  History of thrombocytopenia noted to be platelet count of 49,000, previously elevated AST/ALT at 183/87.  Past medical history of alcoholism and alcoholic hepatitis as well as hepatitis C.  CT of the abdomen in 2017 with fatty liver.  Follow-up ultrasound of the abdomen 05/31/2017 showed hepatic cirrhosis but normal spleen size.  She was diagnosed with immune thrombocytopenia and given pulse dose of dexamethasone.  Repeat CBC after steroid course found increased platelet count from 33,000-81,000.  LFTs continue to be mildly elevated with AST at 68 and ALT normal.  Other LFTs normal.  INR 1.21.  Previous admission for encephalopathy deemed to be multifactorial with underlying liver disease and use of psychotropic medications and use of narcotics.  She was discharged on lactulose.  No history of colonoscopy or endoscopy in our system.  Her last visit she was doing okay.  Still sees hematology for thrombocytopenia.  Notes she has had hepatitis C for couple years and was told that she had this it [place] [place]" and was still drinking and needed to be sober for 6 months before they would treat her.  She has been sober since January 2018.  She lives with her father.  She is not sure exactly how she got hepatitis C.  Some right sided abdominal pain intermittently.  Sees a psychiatrist currently.  No other overt hepatic or GI symptoms.  She was noted to not be taking lactulose currently.  Hepatitis C risk factors reviewed and multiple possible factors  including birth cohort, IV drug use (last use in 10s), and tattoo in the 1990s.  Recommended follow-up labs, right upper quadrant ultrasound elastography, follow-up in 2 months.  Hepatitis C infection was confirmed with RNA of 1.5 million, genotype 1b.  CMP with mildly low albumin at 3.5, elevated AST/ALT at 178/89, normal alk phos and bilirubin.  INR mildly elevated at 1.2.  CBC with normal hemoglobin at 12.2, platelets improved but still low at 64. MELD calculated at 8, Child-Pugh A.  Right upper quadrant ultrasound elastography completed 09/17/2017.  Findings include no focal abnormality of the liver, mild contour irregularity consistent with cirrhosis, Metavir score some F3 and F4 with high risk of fibrosis.   We reached out to her insurance and given her status with compensated cirrhosis coverage options for hepatitis C treatment include Mavyret (Epclusa only for decompensated cirrhosis; harvoni for treatment failure).  Today she states she feels ok today. Has a lot of joint and muscle soreness. She hasn't said anything about this to her PCP. Denies abdominal pain, N/V, fever, chills, unintentional weight loss. Denies yellowing of skin/eyes, darkened urine, acute episodic confusion. Has chronic tremors but not hepatic origin (she thinks it's been TDs or previous head injury). Denies chest pain, dyspnea, dizziness, lightheadedness, syncope, near syncope. Denies any other upper or lower GI symptoms.  Past Medical History:  Diagnosis Date  . Alcoholic hepatitis without ascites   . 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 COPD (chronic  obstructive pulmonary disease) ([place])   . Emphysema lung ([place])   . Hepatitis C   . History of HPV infection   . History of pneumonia   . Smoker     Past Surgical History:  Procedure Laterality Date  . CESAREAN SECTION      Current Outpatient Medications  Medication Sig Dispense Refill  . divalproex (DEPAKOTE) 500 MG DR tablet Take  500 mg by mouth 2 (two) times daily.    . risperiDONE (RISPERDAL) 2 MG tablet Take 2 mg by mouth at bedtime.    . varenicline (CHANTIX) 1 MG tablet Take 1 mg by mouth 2 (two) times daily.     No current facility-administered medications for this visit.     Allergies as of 11/16/2017 - Review Complete 11/16/2017  Allergen Reaction Noted  . Amoxicillin  09/10/2017  . Fish allergy Nausea Only 05/24/2017  . Penicillins  09/10/2017    Family History  Problem Relation Age of Onset  . Congenital heart disease Mother   . Bipolar disorder Mother   . Prostate cancer Brother   . Alzheimer's disease Paternal Grandmother   . Colon cancer Neg Hx     Social History   Socioeconomic History  . Marital status: Single    Spouse name: Not on file  . Number of children: Not on file  . Years of education: Not on file  . Highest education level: Not on file  Occupational History  . Not on file  Social Needs  . Financial resource strain: Not on file  . Food insecurity:    Worry: Not on file    Inability: Not on file  . Transportation needs:    Medical: Not on file    Non-medical: Not on file  Tobacco Use  . Smoking status: Current Some Day Smoker    Packs/day: 0.50    Years: 38.00    Pack years: 19.00    Types: Cigarettes  . Smokeless tobacco: Never Used  Substance and Sexual Activity  . Alcohol use: Not Currently    Comment: Last use of alcohol 03/2016  . Drug use: Not Currently  . Sexual activity: Not on file  Lifestyle  . Physical activity:    Days per week: Not on file    Minutes per session: Not on file  . Stress: Not on file  Relationships  . Social connections:    Talks on phone: Not on file    Gets together: Not on file    Attends religious service: Not on file    Active member of club or organization: Not on file    Attends meetings of clubs or organizations: Not on file    Relationship status: Not on file  Other Topics Concern  . Not on file  Social History  Narrative  . Not on file    Review of Systems: General: Negative for anorexia, weight loss, fever, chills, fatigue, weakness. ENT: Negative for hoarseness, difficulty swallowing. CV: Negative for chest pain, angina, palpitations, peripheral edema.  Respiratory: Negative for dyspnea at rest, cough, sputum, wheezing.  GI: See history of present illness. Derm: Negative for rash or itching.  Neuro: Negative for memory loss, confusion.  Endo: Negative for unusual weight change.  Heme: Negative for bruising or bleeding.   Physical Exam: BP 110/68   Pulse 76   Temp (!) 97.2 F (36.2 C) (Oral)   Ht 5' 1"  (1.549 m)   Wt 149 lb (67.6 kg)   BMI 28.15 kg/m  General:  Alert and oriented. Pleasant and cooperative. Well-nourished and well-developed.  Eyes:  Without icterus, sclera clear and conjunctiva pink.  Ears:  Normal auditory acuity. Cardiovascular:  S1, S2 present without murmurs appreciated. Extremities without clubbing or edema. Respiratory:  Clear to auscultation bilaterally. No wheezes, rales, or rhonchi. No distress.  Gastrointestinal:  +BS, soft, non-tender and non-distended. Very mild hepatomegaly with liver border appreciated 1/2 - 1 fingerbreadth below the right costal margin. No splenomegaly noted. No guarding or rebound. No masses appreciated.  Rectal:  Deferred  Musculoskalatal:  Symmetrical without gross deformities. Neurologic:  Alert and oriented x4;  grossly normal neurologically. Psych:  Alert and cooperative. Normal mood and affect. Heme/Lymph/Immune: No excessive bruising noted.    11/16/2017 11:42 AM   Disclaimer: This note was dictated with voice recognition software. Similar sounding words can inadvertently be transcribed and may not be corrected upon review.

## 2017-11-16 NOTE — Assessment & Plan Note (Addendum)
Positive for hepatitis C, confirmed with RNA and found to be genotype 1b.  After discussion with insurance company, Mavyret is her covered hepatitis C treatment option for compensated cirrhosis.  We will await final hepatitis B and HIV serologies prior to requesting coverage.  We will start the paperwork now for prior Auth.  We will call follow-up recommendations.  She will need a 4-week post initiation visit, it very least for labs.  Further recommendations to follow.

## 2017-11-17 LAB — HEPATITIS B SURFACE ANTIBODY,QUALITATIVE: HEP B S AB: REACTIVE — AB

## 2017-11-17 LAB — HIV ANTIBODY (ROUTINE TESTING W REFLEX): HIV 1&2 Ab, 4th Generation: NONREACTIVE

## 2017-11-17 LAB — HEPATITIS B CORE ANTIBODY, TOTAL: HEP B C TOTAL AB: REACTIVE — AB

## 2017-11-17 LAB — HEPATITIS B SURFACE ANTIGEN: HEP B S AG: NONREACTIVE

## 2017-11-19 NOTE — Progress Notes (Signed)
CC'D TO PCP °

## 2017-11-27 ENCOUNTER — Telehealth: Payer: Self-pay

## 2017-11-27 NOTE — Telephone Encounter (Signed)
Rachel Gross, paperwork is on [employer]. He said he was waiting on labs.  Please advise!

## 2017-11-27 NOTE — Telephone Encounter (Signed)
May wait for Eric's return for continuity of care.

## 2017-11-27 NOTE — Telephone Encounter (Signed)
Pt called wanting to know when she would be able to start her hep C medication. I advised her that EG was out of the office and we will find out what is going on and call her as soon as we know something.   Doris, please see if you can help with this.

## 2017-11-28 ENCOUNTER — Inpatient Hospital Stay (HOSPITAL_COMMUNITY): Payer: Medicaid Other | Attending: Hematology

## 2017-11-28 ENCOUNTER — Telehealth: Payer: Self-pay | Admitting: Gastroenterology

## 2017-11-28 DIAGNOSIS — D693 Immune thrombocytopenic purpura: Secondary | ICD-10-CM | POA: Diagnosis not present

## 2017-11-28 DIAGNOSIS — D696 Thrombocytopenia, unspecified: Secondary | ICD-10-CM

## 2017-11-28 LAB — COMPREHENSIVE METABOLIC PANEL
ALT: 201 U/L — ABNORMAL HIGH (ref 0–44)
ANION GAP: 10 (ref 5–15)
AST: 241 U/L — ABNORMAL HIGH (ref 15–41)
Albumin: 3.6 g/dL (ref 3.5–5.0)
Alkaline Phosphatase: 64 U/L (ref 38–126)
BILIRUBIN TOTAL: 0.8 mg/dL (ref 0.3–1.2)
BUN: 5 mg/dL — ABNORMAL LOW (ref 6–20)
CO2: 24 mmol/L (ref 22–32)
Calcium: 9.1 mg/dL (ref 8.9–10.3)
Chloride: 97 mmol/L — ABNORMAL LOW (ref 98–111)
Creatinine, Ser: 0.4 mg/dL — ABNORMAL LOW (ref 0.44–1.00)
Glucose, Bld: 91 mg/dL (ref 70–99)
POTASSIUM: 3.8 mmol/L (ref 3.5–5.1)
Sodium: 131 mmol/L — ABNORMAL LOW (ref 135–145)
TOTAL PROTEIN: 7.7 g/dL (ref 6.5–8.1)

## 2017-11-28 LAB — CBC
HCT: 41.6 % (ref 36.0–46.0)
HEMOGLOBIN: 13.8 g/dL (ref 12.0–15.0)
MCH: 32.3 pg (ref 26.0–34.0)
MCHC: 33.2 g/dL (ref 30.0–36.0)
MCV: 97.4 fL (ref 80.0–100.0)
NRBC: 0 % (ref 0.0–0.2)
Platelets: 64 10*3/uL — ABNORMAL LOW (ref 150–400)
RBC: 4.27 MIL/uL (ref 3.87–5.11)
RDW: 13.4 % (ref 11.5–15.5)
WBC: 5.2 10*3/uL (ref 4.0–10.5)

## 2017-11-28 LAB — DIFFERENTIAL
Abs Immature Granulocytes: 0.01 10*3/uL (ref 0.00–0.07)
BASOS ABS: 0 10*3/uL (ref 0.0–0.1)
BASOS PCT: 0 %
EOS ABS: 0 10*3/uL (ref 0.0–0.5)
Eosinophils Relative: 0 %
IMMATURE GRANULOCYTES: 0 %
LYMPHS PCT: 48 %
Lymphs Abs: 2.5 10*3/uL (ref 0.7–4.0)
Monocytes Absolute: 0.5 10*3/uL (ref 0.1–1.0)
Monocytes Relative: 10 %
NEUTROS ABS: 2.2 10*3/uL (ref 1.7–7.7)
NEUTROS PCT: 42 %

## 2017-11-28 LAB — LACTATE DEHYDROGENASE: LDH: 168 U/L (ref 98–192)

## 2017-11-28 NOTE — Telephone Encounter (Signed)
PATIENT CAME INTO OFFICE WANTING TO KNOW IF THERE HAS BEEN ANY DECISIONS MADE ABOUT HER CARE YET.  I TOLD HER THE NURSE WOULD CALL

## 2017-11-28 NOTE — Telephone Encounter (Signed)
PT is aware we are waiting on Eric to complete and I will give her a call. She is also aware that he is out of the office this week.

## 2017-11-28 NOTE — Telephone Encounter (Signed)
See previous phone note.  

## 2017-12-05 ENCOUNTER — Inpatient Hospital Stay (HOSPITAL_COMMUNITY): Payer: Medicaid Other | Attending: Hematology | Admitting: Hematology

## 2017-12-05 ENCOUNTER — Telehealth: Payer: Self-pay | Admitting: Internal Medicine

## 2017-12-05 ENCOUNTER — Encounter (HOSPITAL_COMMUNITY): Payer: Self-pay | Admitting: Hematology

## 2017-12-05 ENCOUNTER — Other Ambulatory Visit: Payer: Self-pay

## 2017-12-05 VITALS — BP 107/56 | HR 63 | Temp 98.4°F | Resp 16 | Wt 151.0 lb

## 2017-12-05 DIAGNOSIS — F17210 Nicotine dependence, cigarettes, uncomplicated: Secondary | ICD-10-CM

## 2017-12-05 DIAGNOSIS — Z79899 Other long term (current) drug therapy: Secondary | ICD-10-CM | POA: Diagnosis not present

## 2017-12-05 DIAGNOSIS — D696 Thrombocytopenia, unspecified: Secondary | ICD-10-CM

## 2017-12-05 DIAGNOSIS — B1920 Unspecified viral hepatitis C without hepatic coma: Secondary | ICD-10-CM

## 2017-12-05 DIAGNOSIS — D693 Immune thrombocytopenic purpura: Secondary | ICD-10-CM | POA: Diagnosis present

## 2017-12-05 NOTE — Telephone Encounter (Signed)
Pt is inquiring about lab work.  

## 2017-12-05 NOTE — Progress Notes (Signed)
[facility] [address] [place] [address]   CLINIC:  Medical Oncology/Hematology  PCP:  Celene Squibb, MD [place] [address] [phone]   REASON FOR VISIT: Follow-up for thrombocytopenia  CURRENT THERAPY: Observation   INTERVAL HISTORY:  Rachel Gross 52 y.o. female returns for routine follow-up for thrombocytopenia. She needs dental work done and teeth pulled she understands she need to have her platelets checked for the procedure. She has no other complaints at this time. She denies any bleeding or easy bruising. She denies any vomiting or diarrhea. Denies any new pains. Denies any fevers or recent infections.She reports her appetite at 75% and she is maintaining her weight at this time. Her energy level is 25%.   REVIEW OF SYSTEMS:  Review of Systems  Constitutional: Positive for fatigue.  HENT:   Positive for trouble swallowing.   Gastrointestinal: Positive for constipation and nausea.  Neurological: Positive for extremity weakness.  All other systems reviewed and are negative.    PAST MEDICAL/SURGICAL HISTORY:  Past Medical History:  Diagnosis Date  . Alcoholic hepatitis without ascites   . 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 COPD (chronic obstructive pulmonary disease) ([facility])   . Emphysema lung ([place])   . Hepatitis C   . History of HPV infection   . History of pneumonia   . Smoker    Past Surgical History:  Procedure Laterality Date  . CESAREAN SECTION       SOCIAL HISTORY:  Social History   Socioeconomic History  . Marital status: Single    Spouse name: Not on file  . Number of children: Not on file  . Years of education: Not on file  . Highest education level: Not on file  Occupational History  . Not on file  Social Needs  . Financial resource strain: Not on file  . Food insecurity:    Worry: Not on file    Inability: Not on file  . Transportation needs:    Medical:  Not on file    Non-medical: Not on file  Tobacco Use  . Smoking status: Current Some Day Smoker    Packs/day: 0.50    Years: 38.00    Pack years: 19.00    Types: Cigarettes  . Smokeless tobacco: Never Used  Substance and Sexual Activity  . Alcohol use: Not Currently    Comment: Last use of alcohol 03/2016  . Drug use: Not Currently  . Sexual activity: Not on file  Lifestyle  . Physical activity:    Days per week: Not on file    Minutes per session: Not on file  . Stress: Not on file  Relationships  . Social connections:    Talks on phone: Not on file    Gets together: Not on file    Attends religious service: Not on file    Active member of club or organization: Not on file    Attends meetings of clubs or organizations: Not on file    Relationship status: Not on file  . Intimate partner violence:    Fear of current or ex partner: Not on file    Emotionally abused: Not on file    Physically abused: Not on file    Forced sexual activity: Not on file  Other Topics Concern  . Not on file  Social History Narrative  . Not on file    FAMILY HISTORY:  Family History  Problem  Relation Age of Onset  . Congenital heart disease Mother   . Bipolar disorder Mother   . Prostate cancer Brother   . Alzheimer's disease Paternal Grandmother   . Colon cancer Neg Hx     CURRENT MEDICATIONS:  Outpatient Encounter Medications as of 12/05/2017  Medication Sig  . CHANTIX STARTING MONTH PAK 0.5 MG X 11 & 1 MG X 42 tablet TK AS DIRECTED  . divalproex (DEPAKOTE) 500 MG DR tablet Take 500 mg by mouth 2 (two) times daily.  . risperiDONE (RISPERDAL) 2 MG tablet Take 2 mg by mouth at bedtime.  . [DISCONTINUED] varenicline (CHANTIX) 1 MG tablet Take 1 mg by mouth 2 (two) times daily.   No facility-administered encounter medications on file as of 12/05/2017.     ALLERGIES:  Allergies  Allergen Reactions  . Amoxicillin     hallucinations  . Fish Allergy Nausea Only  . Penicillins      Throat swelled     PHYSICAL EXAM:  ECOG Performance status: 1  Vitals:   12/05/17 1131  BP: (!) 107/56  Pulse: 63  Resp: 16  Temp: 98.4 F (36.9 C)  SpO2: 100%   Filed Weights   12/05/17 1131  Weight: 151 lb (68.5 kg)    Physical Exam  Constitutional: She is oriented to person, place, and time. She appears well-developed and well-nourished.  Musculoskeletal: Normal range of motion.  Neurological: She is alert and oriented to person, place, and time.  Skin: Skin is warm and dry.  Psychiatric: She has a normal mood and affect. Her behavior is normal. Judgment and thought content normal.     LABORATORY DATA:  I have reviewed the labs as listed.  CBC    Component Value Date/Time   WBC 5.2 11/28/2017 1132   RBC 4.27 11/28/2017 1132   HGB 13.8 11/28/2017 1132   HCT 41.6 11/28/2017 1132   PLT 64 (L) 11/28/2017 1132   MCV 97.4 11/28/2017 1132   MCH 32.3 11/28/2017 1132   MCHC 33.2 11/28/2017 1132   RDW 13.4 11/28/2017 1132   LYMPHSABS 2.5 11/28/2017 1132   MONOABS 0.5 11/28/2017 1132   EOSABS 0.0 11/28/2017 1132   BASOSABS 0.0 11/28/2017 1132   CMP Latest Ref Rng & Units 11/28/2017 09/10/2017 08/24/2017  Glucose 70 - 99 mg/dL 91 88 81  BUN 6 - 20 mg/dL 5(L) 6(L) 8  Creatinine 0.44 - 1.00 mg/dL 0.40(L) 0.45(L) 0.53  Sodium 135 - 145 mmol/L 131(L) 135 137  Potassium 3.5 - 5.1 mmol/L 3.8 3.9 3.9  Chloride 98 - 111 mmol/L 97(L) 101 100  CO2 22 - 32 mmol/L 24 27 29   Calcium 8.9 - 10.3 mg/dL 9.1 8.9 9.0  Total Protein 6.5 - 8.1 g/dL 7.7 7.1 7.1  Total Bilirubin 0.3 - 1.2 mg/dL 0.8 0.6 0.7  Alkaline Phos 38 - 126 U/L 64 - 50  AST 15 - 41 U/L 241(H) 178(H) 68(H)  ALT 0 - 44 U/L 201(H) 89(H) 42       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 Juel Burrow office from April 2019 shows platelet count of 49.  I have reviewed medical records from [place], her platelet count was 127.  We have  done work-up for thrombocytopenia.  Her platelet count dropped to 33.  Lupus anticoagulant and HIV test were negative. -Ultrasound of the abdomen on 05/31/2017 shows hepatic cirrhosis, normal sized spleen. - We have given a trial of pulse  dexamethasone 40 mg for 4 days, on 06/14/2017.  Patient tolerated dexamethasone very well.  She felt better because of her energy level improvement.  Her platelet count improved to 81 from 33. - She was  hospitalized in July with metabolic encephalopathy with elevated ammonia levels.  This happened after she was being treated for her teeth infection with amoxicillin and codeine.  Platelet count dropped to 28 while she was inpatient.  She received dexamethasone for 4 days which resulted in improvement in her platelet count. - She was evaluated by GI and is being considered for treatment of hepatitis C. -We discussed the results of the blood work which showed platelet count of 64.  This has been stable since July.  Patient denies any bleeding issues. - She is planning to have 7 of her teeth pulled out.  I have told her to have her platelet count checked prior to the procedure. -We will see her back in 6 weeks for follow-up.  We will closely monitor platelet count during her hep C treatments.  2.  Recurrent pneumonias: She reports hospitalization with recurrent pneumonias in the last 3 years.  I have checked her immunoglobulins which are normal.  3.  Hepatitis C: She was evaluated by GI and is waiting to get treatment started for hep C.       Orders placed this encounter:  Orders Placed This Encounter  Procedures  . CBC with Differential/Platelet  . Comprehensive metabolic panel      Rachel Jack, MD [place] [phone]

## 2017-12-05 NOTE — Telephone Encounter (Signed)
Pt called asking "I need to know what's going on". She said she hasn't heard about her labs results and she knows it's in the computer and no one has told her anything. Please call her at [phone]

## 2017-12-05 NOTE — Assessment & Plan Note (Signed)
1.  Immune thrombocytopenia: -Recent blood work at Dr. Juel Burrow office from April 2019 shows platelet count of 49.  I have reviewed medical records from [place], her platelet count was 127.  We have done work-up for thrombocytopenia.  Her platelet count dropped to 33.  Lupus anticoagulant and HIV test were negative. -Ultrasound of the abdomen on 05/31/2017 shows hepatic cirrhosis, normal sized spleen. - We have given a trial of pulse dexamethasone 40 mg for 4 days, on 06/14/2017.  Patient tolerated dexamethasone very well.  She felt better because of her energy level improvement.  Her platelet count improved to 81 from 33. - She was  hospitalized in July with metabolic encephalopathy with elevated ammonia levels.  This happened after she was being treated for her teeth infection with amoxicillin and codeine.  Platelet count dropped to 28 while she was inpatient.  She received dexamethasone for 4 days which resulted in improvement in her platelet count. - She was evaluated by GI and is being considered for treatment of hepatitis C. -We discussed the results of the blood work which showed platelet count of 64.  This has been stable since July.  Patient denies any bleeding issues. - She is planning to have 7 of her teeth pulled out.  I have told her to have her platelet count checked prior to the procedure. -We will see her back in 6 weeks for follow-up.  We will closely monitor platelet count during her hep C treatments.  2.  Recurrent pneumonias: She reports hospitalization with recurrent pneumonias in the last 3 years.  I have checked her immunoglobulins which are normal.  3.  Hepatitis C: She was evaluated by GI and is waiting to get treatment started for hep C.

## 2017-12-05 NOTE — Patient Instructions (Signed)
[facility] at [facility] Discharge Instructions  Follow up in 6 weeks with labs .   Thank you for choosing [facility] at [facility] to provide your oncology and hematology care.  To afford each patient quality time with our provider, please arrive at least 15 minutes before your scheduled appointment time.   If you have a lab appointment with the [facility] please come in thru the  Main Entrance and check in at the main information desk  You need to re-schedule your appointment should you arrive 10 or more minutes late.  We strive to give you quality time with our providers, and arriving late affects you and other patients whose appointments are after yours.  Also, if you no show three or more times for appointments you may be dismissed from the clinic at the providers discretion.     Again, thank you for choosing [facility].  Our hope is that these requests will decrease the amount of time that you wait before being seen by our physicians.       _____________________________________________________________  Should you have questions after your visit to [facility], please contact our office at [phone] between the hours of 8:00 a.m. and 4:30 p.m.  Voicemails left after 4:00 p.m. will not be returned until the following business day.  For prescription refill requests, have your pharmacy contact our office and allow 72 hours.    Cancer Center Support Programs:   > Cancer Support Group  2nd Tuesday of the month 1pm-2pm, Journey Room

## 2017-12-07 NOTE — Telephone Encounter (Signed)
See result note.  

## 2017-12-10 ENCOUNTER — Other Ambulatory Visit: Payer: Self-pay | Admitting: *Deleted

## 2017-12-10 DIAGNOSIS — R768 Other specified abnormal immunological findings in serum: Secondary | ICD-10-CM

## 2017-12-10 NOTE — Telephone Encounter (Signed)
Rachel Gross, what is status on this?

## 2017-12-11 NOTE — Telephone Encounter (Signed)
See result notes. Pt is being referred to [facility].

## 2018-01-17 ENCOUNTER — Inpatient Hospital Stay (HOSPITAL_COMMUNITY): Payer: Medicaid Other

## 2018-01-17 ENCOUNTER — Other Ambulatory Visit (HOSPITAL_COMMUNITY)
Admission: RE | Admit: 2018-01-17 | Discharge: 2018-01-17 | Disposition: A | Discharge status: Home / Self Care | Payer: Medicaid Other | Source: Ambulatory Visit | Attending: Nurse Practitioner | Admitting: Nurse Practitioner

## 2018-01-17 ENCOUNTER — Inpatient Hospital Stay (HOSPITAL_COMMUNITY): Payer: Medicaid Other | Attending: Hematology | Admitting: Hematology

## 2018-01-17 ENCOUNTER — Other Ambulatory Visit: Payer: Self-pay

## 2018-01-17 ENCOUNTER — Encounter (HOSPITAL_COMMUNITY): Payer: Self-pay | Admitting: Hematology

## 2018-01-17 VITALS — BP 98/69 | HR 63 | Temp 98.1°F | Resp 18 | Wt 153.2 lb

## 2018-01-17 DIAGNOSIS — K7469 Other cirrhosis of liver: Secondary | ICD-10-CM | POA: Diagnosis present

## 2018-01-17 DIAGNOSIS — B1920 Unspecified viral hepatitis C without hepatic coma: Secondary | ICD-10-CM | POA: Diagnosis not present

## 2018-01-17 DIAGNOSIS — D696 Thrombocytopenia, unspecified: Secondary | ICD-10-CM

## 2018-01-17 DIAGNOSIS — B182 Chronic viral hepatitis C: Secondary | ICD-10-CM | POA: Diagnosis not present

## 2018-01-17 DIAGNOSIS — D693 Immune thrombocytopenic purpura: Secondary | ICD-10-CM | POA: Insufficient documentation

## 2018-01-17 LAB — COMPREHENSIVE METABOLIC PANEL WITH GFR
ALT: 105 U/L — ABNORMAL HIGH (ref 0–44)
AST: 141 U/L — ABNORMAL HIGH (ref 15–41)
Albumin: 3.4 g/dL — ABNORMAL LOW (ref 3.5–5.0)
Alkaline Phosphatase: 59 U/L (ref 38–126)
Anion gap: 9 (ref 5–15)
BUN: <5 mg/dL — ABNORMAL LOW (ref 6–20)
CO2: 24 mmol/L (ref 22–32)
Calcium: 9 mg/dL (ref 8.9–10.3)
Chloride: 106 mmol/L (ref 98–111)
Creatinine, Ser: 0.46 mg/dL (ref 0.44–1.00)
GFR calc Af Amer: >60 mL/min
GFR calc non Af Amer: >60 mL/min
Glucose, Bld: 113 mg/dL — ABNORMAL HIGH (ref 70–99)
Potassium: 3.5 mmol/L (ref 3.5–5.1)
Sodium: 139 mmol/L (ref 135–145)
Total Bilirubin: 0.5 mg/dL (ref 0.3–1.2)
Total Protein: 7.5 g/dL (ref 6.5–8.1)

## 2018-01-17 LAB — PROTIME-INR
INR: 1.13
Prothrombin Time: 14.4 seconds (ref 11.4–15.2)

## 2018-01-17 LAB — IRON AND TIBC
Iron: 146 ug/dL (ref 28–170)
Saturation Ratios: 40 % — ABNORMAL HIGH (ref 10.4–31.8)
TIBC: 362 ug/dL (ref 250–450)
UIBC: 216 ug/dL

## 2018-01-17 LAB — CBC WITH DIFFERENTIAL/PLATELET
ABS IMMATURE GRANULOCYTES: 0.01 10*3/uL (ref 0.00–0.07)
BASOS ABS: 0 10*3/uL (ref 0.0–0.1)
BASOS PCT: 0 %
Eosinophils Absolute: 0 10*3/uL (ref 0.0–0.5)
Eosinophils Relative: 1 %
HCT: 39.6 % (ref 36.0–46.0)
Hemoglobin: 12.8 g/dL (ref 12.0–15.0)
Immature Granulocytes: 0 %
Lymphocytes Relative: 44 %
Lymphs Abs: 2.1 10*3/uL (ref 0.7–4.0)
MCH: 32.8 pg (ref 26.0–34.0)
MCHC: 32.3 g/dL (ref 30.0–36.0)
MCV: 101.5 fL — ABNORMAL HIGH (ref 80.0–100.0)
MONO ABS: 0.4 10*3/uL (ref 0.1–1.0)
Monocytes Relative: 9 %
NEUTROS ABS: 2.2 10*3/uL (ref 1.7–7.7)
NEUTROS PCT: 46 %
NRBC: 0 % (ref 0.0–0.2)
PLATELETS: 102 10*3/uL — AB (ref 150–400)
RBC: 3.9 MIL/uL (ref 3.87–5.11)
RDW: 14.3 % (ref 11.5–15.5)
WBC: 4.8 10*3/uL (ref 4.0–10.5)

## 2018-01-17 LAB — FERRITIN: Ferritin: 136 ng/mL (ref 11–307)

## 2018-01-17 LAB — BILIRUBIN, DIRECT: Bilirubin, Direct: 0.2 mg/dL (ref 0.0–0.2)

## 2018-01-17 NOTE — Patient Instructions (Signed)
[facility] at [facility] Discharge Instructions     Thank you for choosing [facility] at [facility] to provide your oncology and hematology care.  To afford each patient quality time with our provider, please arrive at least 15 minutes before your scheduled appointment time.   If you have a lab appointment with the Cancer Center please come in thru the  Main Entrance and check in at the main information desk  You need to re-schedule your appointment should you arrive 10 or more minutes late.  We strive to give you quality time with our providers, and arriving late affects you and other patients whose appointments are after yours.  Also, if you no show three or more times for appointments you may be dismissed from the clinic at the providers discretion.     Again, thank you for choosing [facility].  Our hope is that these requests will decrease the amount of time that you wait before being seen by our physicians.       _____________________________________________________________  Should you have questions after your visit to [facility], please contact our office at [phone] between the hours of 8:00 a.m. and 4:30 p.m.  Voicemails left after 4:00 p.m. will not be returned until the following business day.  For prescription refill requests, have your pharmacy contact our office and allow 72 hours.    Cancer Center Support Programs:   > Cancer Support Group  2nd Tuesday of the month 1pm-2pm, Journey Room    

## 2018-01-17 NOTE — Progress Notes (Signed)
[facility] [place], [place] [address]   CLINIC:  Medical Oncology/Hematology  PCP:  Celene Squibb, MD [place] [address] [phone]   REASON FOR VISIT: Follow-up for thrombocytopenia  CURRENT THERAPY: Observation    INTERVAL HISTORY:  Ms. Gilberto 52 y.o. female returns for routine follow-up for thrombocytopenia. She reports she is feeling better and has more energy than normal. She recently had dental work and she is recovering well. She has no other complaints at this time.Denies any nausea, vomiting, or diarrhea. Denies any new pains. Had not noticed any recent bleeding such as epistaxis, hematuria or hematochezia. Denies recent chest pain on exertion, shortness of breath on minimal exertion, pre-syncopal episodes, or palpitations. Denies any numbness or tingling in hands or feet. Denies any recent fevers, infections, or recent hospitalizations. Denies any petechia or easy bruising. She reports her appetite at 50%.      REVIEW OF SYSTEMS:  Review of Systems  HENT:         Recently had teeth pulled.  All other systems reviewed and are negative.    PAST MEDICAL/SURGICAL HISTORY:  Past Medical History:  Diagnosis Date  . Alcoholic hepatitis without ascites   . 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 COPD (chronic obstructive pulmonary disease) ([place])   . Emphysema lung ([place])   . Hepatitis C   . History of HPV infection   . History of pneumonia   . Smoker    Past Surgical History:  Procedure Laterality Date  . CESAREAN SECTION       SOCIAL HISTORY:  Social History   Socioeconomic History  . Marital status: Single    Spouse name: Not on file  . Number of children: Not on file  . Years of education: Not on file  . Highest education level: Not on file  Occupational History  . Not on file  Social Needs  . Financial resource strain: Not on file  . Food insecurity:    Worry: Not on  file    Inability: Not on file  . Transportation needs:    Medical: Not on file    Non-medical: Not on file  Tobacco Use  . Smoking status: Current Some Day Smoker    Packs/day: 0.50    Years: 38.00    Pack years: 19.00    Types: Cigarettes  . Smokeless tobacco: Never Used  Substance and Sexual Activity  . Alcohol use: Not Currently    Comment: Last use of alcohol 03/2016  . Drug use: Not Currently  . Sexual activity: Not on file  Lifestyle  . Physical activity:    Days per week: Not on file    Minutes per session: Not on file  . Stress: Not on file  Relationships  . Social connections:    Talks on phone: Not on file    Gets together: Not on file    Attends religious service: Not on file    Active member of club or organization: Not on file    Attends meetings of clubs or organizations: Not on file    Relationship status: Not on file  . Intimate partner violence:    Fear of current or ex partner: Not on file    Emotionally abused: Not on file    Physically abused: Not on file    Forced sexual activity: Not on file  Other Topics Concern  . Not on file  Social  History Narrative  . Not on file    FAMILY HISTORY:  Family History  Problem Relation Age of Onset  . Congenital heart disease Mother   . Bipolar disorder Mother   . Prostate cancer Brother   . Alzheimer's disease Paternal Grandmother   . Colon cancer Neg Hx     CURRENT MEDICATIONS:  Outpatient Encounter Medications as of 01/17/2018  Medication Sig  . CHANTIX STARTING MONTH PAK 0.5 MG X 11 & 1 MG X 42 tablet TK AS DIRECTED  . divalproex (DEPAKOTE) 500 MG DR tablet Take 500 mg by mouth 2 (two) times daily.  . risperiDONE (RISPERDAL) 2 MG tablet Take 2 mg by mouth at bedtime.   No facility-administered encounter medications on file as of 01/17/2018.     ALLERGIES:  Allergies  Allergen Reactions  . Amoxicillin     hallucinations  . Fish Allergy Nausea Only  . Penicillins     Throat swelled      PHYSICAL EXAM:  ECOG Performance status: 1  Vitals:   01/17/18 1108  BP: 98/69  Pulse: 63  Resp: 18  Temp: 98.1 F (36.7 C)  SpO2: 100%   Filed Weights   01/17/18 1108  Weight: 153 lb 3.2 oz (69.5 kg)    Physical Exam Constitutional:      Appearance: Normal appearance. She is normal weight.  Musculoskeletal: Normal range of motion.  Skin:    General: Skin is warm and dry.  Neurological:     General: No focal deficit present.     Mental Status: She is alert and oriented to person, place, and time. Mental status is at baseline.  Psychiatric:        Mood and Affect: Mood normal.        Behavior: Behavior normal.        Thought Content: Thought content normal.        Judgment: Judgment normal.      LABORATORY DATA:  I have reviewed the labs as listed.  CBC    Component Value Date/Time   WBC 4.8 01/17/2018 1002   RBC 3.90 01/17/2018 1002   HGB 12.8 01/17/2018 1002   HCT 39.6 01/17/2018 1002   PLT 102 (L) 01/17/2018 1002   MCV 101.5 (H) 01/17/2018 1002   MCH 32.8 01/17/2018 1002   MCHC 32.3 01/17/2018 1002   RDW 14.3 01/17/2018 1002   LYMPHSABS 2.1 01/17/2018 1002   MONOABS 0.4 01/17/2018 1002   EOSABS 0.0 01/17/2018 1002   BASOSABS 0.0 01/17/2018 1002   CMP Latest Ref Rng & Units 01/17/2018 11/28/2017 09/10/2017  Glucose 70 - 99 mg/dL 113(H) 91 88  BUN 6 - 20 mg/dL <5(L) 5(L) 6(L)  Creatinine 0.44 - 1.00 mg/dL 0.46 0.40(L) 0.45(L)  Sodium 135 - 145 mmol/L 139 131(L) 135  Potassium 3.5 - 5.1 mmol/L 3.5 3.8 3.9  Chloride 98 - 111 mmol/L 106 97(L) 101  CO2 22 - 32 mmol/L 24 24 27   Calcium 8.9 - 10.3 mg/dL 9.0 9.1 8.9  Total Protein 6.5 - 8.1 g/dL 7.5 7.7 7.1  Total Bilirubin 0.3 - 1.2 mg/dL 0.5 0.8 0.6  Alkaline Phos 38 - 126 U/L 59 64 -  AST 15 - 41 U/L 141(H) 241(H) 178(H)  ALT 0 - 44 U/L 105(H) 201(H) 89(H)       DIAGNOSTIC IMAGING:  I have independently reviewed the scans and discussed with the patient.   I have reviewed Francene Finders,  NP's note and agree with the documentation.  I personally  performed a face-to-face visit, made revisions and my assessment and plan is as follows.    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 Juel Burrow office from April 2019 shows platelet count of 49.  I have reviewed medical records from [place], her platelet count was 127.  We have done work-up for thrombocytopenia.  Her platelet count dropped to 33.  Lupus anticoagulant and HIV test were negative. -Ultrasound of the abdomen on 05/31/2017 shows hepatic cirrhosis, normal sized spleen. - We have given a trial of pulse dexamethasone 40 mg for 4 days, on 06/14/2017.  Patient tolerated dexamethasone very well.  She felt better because of her energy level improvement.  Her platelet count improved to 81 from 33. - She was  hospitalized in July with metabolic encephalopathy with elevated ammonia levels.  This happened after she was being treated for her teeth infection with amoxicillin and codeine.  Platelet count dropped to 28 while she was inpatient.  She received dexamethasone for 4 days which resulted in improvement in her platelet count. - She is seeing a GI specialist in [facility] for initiation of treatment for hepatitis C.  They have done a bunch of blood work. - Her platelet count has improved to 102.  She does not have any bleeding issues. - We will see her back in 3 months with repeat platelet count.  2.  Recurrent pneumonias: She reports hospitalization with recurrent pneumonias in the last 3 years.  I have checked her immunoglobulins which are normal.  3.  Hepatitis C:  -She is undergoing work-up.  She will soon start treatment for hep C.       Orders placed this encounter:  Orders Placed This Encounter  Procedures  . CBC with Differential/Platelet  . Comprehensive metabolic panel      Derek Jack, MD [place] [phone]

## 2018-01-17 NOTE — Assessment & Plan Note (Signed)
1.  Immune thrombocytopenia: -Recent blood work at Dr. Juel Burrow office from April 2019 shows platelet count of 49.  I have reviewed medical records from [place], her platelet count was 127.  We have done work-up for thrombocytopenia.  Her platelet count dropped to 33.  Lupus anticoagulant and HIV test were negative. -Ultrasound of the abdomen on 05/31/2017 shows hepatic cirrhosis, normal sized spleen. - We have given a trial of pulse dexamethasone 40 mg for 4 days, on 06/14/2017.  Patient tolerated dexamethasone very well.  She felt better because of her energy level improvement.  Her platelet count improved to 81 from 33. - She was  hospitalized in July with metabolic encephalopathy with elevated ammonia levels.  This happened after she was being treated for her teeth infection with amoxicillin and codeine.  Platelet count dropped to 28 while she was inpatient.  She received dexamethasone for 4 days which resulted in improvement in her platelet count. - She is seeing a GI specialist in [facility] for initiation of treatment for hepatitis C.  They have done a bunch of blood work. - Her platelet count has improved to 102.  She does not have any bleeding issues. - We will see her back in 3 months with repeat platelet count.  2.  Recurrent pneumonias: She reports hospitalization with recurrent pneumonias in the last 3 years.  I have checked her immunoglobulins which are normal.  3.  Hepatitis C:  -She is undergoing work-up.  She will soon start treatment for hep C.

## 2018-01-18 LAB — MISC LABCORP TEST (SEND OUT): Labcorp test code: 163980

## 2018-01-18 LAB — ANTI-SMOOTH MUSCLE ANTIBODY, IGG: F-Actin IgG: 19 Units (ref 0–19)

## 2018-01-18 LAB — IGG, IGA, IGM
IGA: 326 mg/dL (ref 87–352)
IGM (IMMUNOGLOBULIN M), SRM: 316 mg/dL — AB (ref 26–217)
IgG (Immunoglobin G), Serum: 1998 mg/dL — ABNORMAL HIGH (ref 700–1600)

## 2018-01-18 LAB — ALPHA-1-ANTITRYPSIN: A-1 Antitrypsin, Ser: 162 mg/dL (ref 101–187)

## 2018-01-18 LAB — MITOCHONDRIAL ANTIBODIES: Mitochondrial M2 Ab, IgG: 55.1 Units — ABNORMAL HIGH (ref 0.0–20.0)

## 2018-01-22 LAB — AFP TUMOR MARKER: AFP, Serum, Tumor Marker: 19 ng/mL — ABNORMAL HIGH (ref 0.0–8.3)

## 2018-01-31 HISTORY — PX: TOOTH EXTRACTION: SUR596

## 2018-02-28 ENCOUNTER — Telehealth: Payer: Self-pay | Admitting: Gastroenterology

## 2018-02-28 NOTE — Telephone Encounter (Signed)
Routing to EG for advice. She was to see [place].

## 2018-02-28 NOTE — Telephone Encounter (Signed)
Patient called stating that [facility]  wanted her to call and schedule an EGD. Patient did not know why and I told her to call them and have them send [place] the information so we could get that scheduled

## 2018-02-28 NOTE — Telephone Encounter (Signed)
Routing to the clinical pool to follow-up on referral

## 2018-03-01 NOTE — Telephone Encounter (Signed)
I reviewed Rachel Gross's note (scanned and available in the "media" tab) and she does recommend EGD for variceal screening. Given that it's been a few months since we've seen her, she'll need an OV to schedule EGD on propofol. Can really be with any provider.

## 2018-03-04 NOTE — Telephone Encounter (Signed)
Called pt, OV scheduled 03/06/18.

## 2018-03-06 ENCOUNTER — Encounter: Payer: Self-pay | Admitting: Nurse Practitioner

## 2018-03-06 ENCOUNTER — Other Ambulatory Visit: Payer: Self-pay | Admitting: Nurse Practitioner

## 2018-03-06 ENCOUNTER — Telehealth: Payer: Self-pay

## 2018-03-06 ENCOUNTER — Other Ambulatory Visit: Payer: Self-pay

## 2018-03-06 ENCOUNTER — Ambulatory Visit: Payer: Medicaid Other | Admitting: Nurse Practitioner

## 2018-03-06 VITALS — BP 108/69 | HR 96 | Temp 97.4°F | Ht 61.0 in | Wt 156.4 lb

## 2018-03-06 DIAGNOSIS — K7469 Other cirrhosis of liver: Secondary | ICD-10-CM

## 2018-03-06 DIAGNOSIS — D696 Thrombocytopenia, unspecified: Secondary | ICD-10-CM

## 2018-03-06 DIAGNOSIS — R772 Abnormality of alphafetoprotein: Secondary | ICD-10-CM

## 2018-03-06 DIAGNOSIS — K7460 Unspecified cirrhosis of liver: Secondary | ICD-10-CM

## 2018-03-06 NOTE — Assessment & Plan Note (Addendum)
The patient has cirrhosis.  History of drug and alcohol use.  She is a started hepatitis C treatment at the liver clinic in [place].  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  She has had some decompensation in the past.  She has never had variceal screening and she was referred by the liver clinic back to [place] for an EGD.  She does have thrombocytopenia with a platelet count typically in the 60s.  However, her last platelet count was 102.  She is excited about this.  At this point we will proceed with upper endoscopy for variceal screening and band ligation as necessary.  She is currently being followed by the liver clinic in [place].  We will have her follow-up in 1 year to check on her clinical progression and see if she will remain with the liver clinic versus locally.  Proceed with EGD on propofol/MAC with Dr. Gala Romney in near future: the risks, benefits, and alternatives have been discussed with the patient in detail. The patient states understanding and desires to proceed.  The patient is not on any anticoagulants, anxiolytics, chronic pain medications, or antidepressants.  She does have a history of drug and alcohol abuse, but none currently.  We will plan for the procedure on propofol/MAC to promote adequate sedation.

## 2018-03-06 NOTE — Assessment & Plan Note (Signed)
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  Her platelet count typically is in the 60s but recently it has been 102.  No need for treatment of thrombocytopenia prior to procedure at this point with a recent platelet count of 102.  EGD for variceal screening as per above.

## 2018-03-06 NOTE — Progress Notes (Signed)
CC'D TO PCP °

## 2018-03-06 NOTE — Patient Instructions (Signed)
Your health issues we discussed today were:   Need for upper endoscopy 1. As we discussed, because of your liver disease we need to do an upper endoscopy to look for swollen blood vessels in your swallowing tube 2. We can rubber band these to help get rid of them if they are there and if this is necessary. 3. We may put you on a medication to help prevent swollen blood vessels, depending on what your upper endoscopy shows  Overall I recommend:  1. Return for follow-up in 1 year 2. Continue to see Dawn at the liver clinic 3. Call us if you have any questions or concerns.  At [facility] we value your feedback. You may receive a survey about your visit today. Please share your experience as we strive to create trusting relationships with our patients to provide genuine, compassionate, quality care.  We appreciate your understanding and patience as we review any laboratory studies, imaging, and other diagnostic tests that are ordered as we care for you. Our office policy is 5 business days for review of these results, and any emergent or urgent results are addressed in a timely manner for your best interest. If you do not hear from our office in 1 week, please contact us.   We also encourage the use of MyChart, which contains your medical information for your review as well. If you are not enrolled in this feature, an access code is on this after visit summary for your convenience. Thank you for allowing [place] to be involved in your care.  It was great to see you today!  I hope you have a great day!!

## 2018-03-06 NOTE — Progress Notes (Signed)
Referring Provider: Celene Squibb, MD Primary Care Physician:  Celene Squibb, MD Primary GI:  Dr. Gala Romney  Chief Complaint  Patient presents with  . Hepatitis C    f/u.    HPI:   Rachel Gross is a 53 y.o. female who presents to schedule an upper endoscopy.  She was referred to the liver clinic in [facility] for treatment of hepatitis C and a complicated cirrhosis patient with previous decompensation.  She was last seen by the liver clinic on 02/28/2018 and plans appear to be for treatment with Epclusa.  At that time her meld score was 8 and child Pugh class A.  They recommended she undergo screening endoscopy by local GI.  No history of endoscopy in our system.  Today she states she's doing ok overall. Has started taking Epclusa, no side effects so far. Has not missed any doses. Takes it every day at 8:00 am. Will have her dentures on Friday and is happy about this. Denies abdominal pain, N/V, hematochezia, melena, fever, chills, unintentional weight loss. Denies chest pain, dyspnea, dizziness, lightheadedness, syncope, near syncope. Denies any other upper or lower GI symptoms.  Past Medical History:  Diagnosis Date  . Alcoholic hepatitis without ascites   . 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 COPD (chronic obstructive pulmonary disease) ([place])   . Emphysema lung ([place])   . Hepatitis C   . History of HPV infection   . History of pneumonia   . Smoker     Past Surgical History:  Procedure Laterality Date  . CESAREAN SECTION    . TOOTH EXTRACTION  01/31/2018   x 7    Current Outpatient Medications  Medication Sig Dispense Refill  . CHANTIX STARTING MONTH PAK 0.5 MG X 11 & 1 MG X 42 tablet TK AS DIRECTED  0  . divalproex (DEPAKOTE) 500 MG DR tablet Take 500 mg by mouth daily.     . risperiDONE (RISPERDAL) 2 MG tablet Take 2 mg by mouth at bedtime.    . Sofosbuvir-Velpatasvir (EPCLUSA PO) Take by mouth daily.     No current facility-administered  medications for this visit.     Allergies as of 03/06/2018 - Review Complete 03/06/2018  Allergen Reaction Noted  . Amoxicillin  09/10/2017  . Fish allergy Nausea Only 05/24/2017  . Penicillins  09/10/2017    Family History  Problem Relation Age of Onset  . Congenital heart disease Mother   . Bipolar disorder Mother   . Prostate cancer Brother   . Alzheimer's disease Paternal Grandmother   . Colon cancer Neg Hx     Social History   Socioeconomic History  . Marital status: Single    Spouse name: Not on file  . Number of children: Not on file  . Years of education: Not on file  . Highest education level: Not on file  Occupational History  . Not on file  Social Needs  . Financial resource strain: Not on file  . Food insecurity:    Worry: Not on file    Inability: Not on file  . Transportation needs:    Medical: Not on file    Non-medical: Not on file  Tobacco Use  . Smoking status: Current Some Day Smoker    Packs/day: 0.50    Years: 38.00    Pack years: 19.00    Types: Cigarettes  . Smokeless tobacco: Never Used  Substance and Sexual Activity  . Alcohol use: Not Currently  Comment: Last use of alcohol 03/2016  . Drug use: Not Currently  . Sexual activity: Not on file  Lifestyle  . Physical activity:    Days per week: Not on file    Minutes per session: Not on file  . Stress: Not on file  Relationships  . Social connections:    Talks on phone: Not on file    Gets together: Not on file    Attends religious service: Not on file    Active member of club or organization: Not on file    Attends meetings of clubs or organizations: Not on file    Relationship status: Not on file  Other Topics Concern  . Not on file  Social History Narrative  . Not on file    Review of Systems: General: Negative for anorexia, weight loss, fever, chills, fatigue, weakness. ENT: Negative for hoarseness, difficulty swallowing. CV: Negative for chest pain, angina,  palpitations, peripheral edema.  Respiratory: Negative for dyspnea at rest, cough, sputum, wheezing.  GI: See history of present illness. Endo: Negative for unusual weight change.  Heme: Negative for bruising or bleeding. Allergy: Negative for rash or hives.   Physical Exam: BP 108/69   Pulse 96   Temp (!) 97.4 F (36.3 C) (Oral)   Ht 5\' 1"  (1.549 m)   Wt 156 lb 6.4 oz (70.9 kg)   BMI 29.55 kg/m  General:   Alert and oriented. Pleasant and cooperative. Well-nourished and well-developed.  Eyes:  Without icterus, sclera clear and conjunctiva pink.  Ears:  Normal auditory acuity. Cardiovascular:  S1, S2 present without murmurs appreciated. Extremities without clubbing or edema. Respiratory:  Clear to auscultation bilaterally. No wheezes, rales, or rhonchi. No distress.  Gastrointestinal:  +BS, soft, non-tender and non-distended. No HSM noted. No guarding or rebound. No masses appreciated.  Rectal:  Deferred  Musculoskalatal:  Symmetrical without gross deformities. Neurologic:  Alert and oriented x4;  grossly normal neurologically. Psych:  Alert and cooperative. Normal mood and affect. Heme/Lymph/Immune: No excessive bruising noted.    03/06/2018 10:17 AM   Disclaimer: This note was dictated with voice recognition software. Similar sounding words can inadvertently be transcribed and may not be corrected upon review.

## 2018-03-06 NOTE — Telephone Encounter (Signed)
Called and informed pt of pre-op appt 04/25/18 at 1:15pm. Letter mailed.

## 2018-03-13 ENCOUNTER — Ambulatory Visit
Admission: RE | Admit: 2018-03-13 | Discharge: 2018-03-13 | Disposition: A | Discharge status: Home / Self Care | Payer: Medicaid Other | Source: Ambulatory Visit | Attending: Interventional Radiology | Admitting: Interventional Radiology

## 2018-03-13 ENCOUNTER — Ambulatory Visit
Admission: RE | Admit: 2018-03-13 | Discharge: 2018-03-13 | Disposition: A | Discharge status: Home / Self Care | Payer: Medicaid Other | Source: Ambulatory Visit | Attending: Nurse Practitioner | Admitting: Nurse Practitioner

## 2018-03-13 ENCOUNTER — Other Ambulatory Visit: Payer: Self-pay | Admitting: Interventional Radiology

## 2018-03-13 ENCOUNTER — Encounter: Payer: Self-pay | Admitting: Interventional Radiology

## 2018-03-13 DIAGNOSIS — R772 Abnormality of alphafetoprotein: Secondary | ICD-10-CM

## 2018-03-13 DIAGNOSIS — K7469 Other cirrhosis of liver: Secondary | ICD-10-CM

## 2018-03-13 HISTORY — PX: IR US GUIDE VASC ACCESS RIGHT: IMG2390

## 2018-03-13 MED ORDER — IOPAMIDOL (ISOVUE-300) INJECTION 61%
100.0000 mL | Freq: Once | INTRAVENOUS | Status: AC | PRN
  Administered 2018-03-13: 100 mL via INTRAVENOUS

## 2018-04-17 ENCOUNTER — Other Ambulatory Visit: Payer: Self-pay

## 2018-04-18 ENCOUNTER — Encounter (HOSPITAL_COMMUNITY): Payer: Self-pay | Admitting: Hematology

## 2018-04-18 ENCOUNTER — Other Ambulatory Visit: Payer: Self-pay

## 2018-04-18 ENCOUNTER — Inpatient Hospital Stay (HOSPITAL_BASED_OUTPATIENT_CLINIC_OR_DEPARTMENT_OTHER): Payer: Medicaid Other | Admitting: Hematology

## 2018-04-18 ENCOUNTER — Inpatient Hospital Stay (HOSPITAL_COMMUNITY): Payer: Medicaid Other | Attending: Hematology

## 2018-04-18 ENCOUNTER — Ambulatory Visit (HOSPITAL_COMMUNITY): Payer: Medicaid Other | Admitting: Nurse Practitioner

## 2018-04-18 ENCOUNTER — Other Ambulatory Visit (HOSPITAL_COMMUNITY): Payer: Medicaid Other

## 2018-04-18 DIAGNOSIS — R911 Solitary pulmonary nodule: Secondary | ICD-10-CM | POA: Insufficient documentation

## 2018-04-18 DIAGNOSIS — D693 Immune thrombocytopenic purpura: Secondary | ICD-10-CM

## 2018-04-18 DIAGNOSIS — K7460 Unspecified cirrhosis of liver: Secondary | ICD-10-CM

## 2018-04-18 DIAGNOSIS — B1920 Unspecified viral hepatitis C without hepatic coma: Secondary | ICD-10-CM | POA: Diagnosis not present

## 2018-04-18 LAB — COMPREHENSIVE METABOLIC PANEL
ALK PHOS: 57 U/L (ref 38–126)
ALT: 19 U/L (ref 0–44)
AST: 31 U/L (ref 15–41)
Albumin: 3.8 g/dL (ref 3.5–5.0)
Anion gap: 9 (ref 5–15)
BUN: 7 mg/dL (ref 6–20)
CO2: 26 mmol/L (ref 22–32)
Calcium: 9.3 mg/dL (ref 8.9–10.3)
Chloride: 99 mmol/L (ref 98–111)
Creatinine, Ser: 0.47 mg/dL (ref 0.44–1.00)
GFR calc Af Amer: >60 mL/min (ref >60)
GFR calc non Af Amer: >60 mL/min (ref >60)
Glucose, Bld: 117 mg/dL — ABNORMAL HIGH (ref 70–99)
Potassium: 4 mmol/L (ref 3.5–5.1)
Sodium: 134 mmol/L — ABNORMAL LOW (ref 135–145)
Total Bilirubin: 0.7 mg/dL (ref 0.3–1.2)
Total Protein: 7.9 g/dL (ref 6.5–8.1)

## 2018-04-18 LAB — CBC WITH DIFFERENTIAL/PLATELET
Abs Immature Granulocytes: 0.01 10*3/uL (ref 0.00–0.07)
Basophils Absolute: 0 10*3/uL (ref 0.0–0.1)
Basophils Relative: 0 %
Eosinophils Absolute: 0.1 10*3/uL (ref 0.0–0.5)
Eosinophils Relative: 1 %
HCT: 39.2 % (ref 36.0–46.0)
HEMOGLOBIN: 13.5 g/dL (ref 12.0–15.0)
Immature Granulocytes: 0 %
LYMPHS PCT: 53 %
Lymphs Abs: 2.9 10*3/uL (ref 0.7–4.0)
MCH: 33.9 pg (ref 26.0–34.0)
MCHC: 34.4 g/dL (ref 30.0–36.0)
MCV: 98.5 fL (ref 80.0–100.0)
Monocytes Absolute: 0.5 10*3/uL (ref 0.1–1.0)
Monocytes Relative: 9 %
Neutro Abs: 2.1 10*3/uL (ref 1.7–7.7)
Neutrophils Relative %: 37 %
Platelets: 94 10*3/uL — ABNORMAL LOW (ref 150–400)
RBC: 3.98 MIL/uL (ref 3.87–5.11)
RDW: 12.8 % (ref 11.5–15.5)
WBC: 5.6 10*3/uL (ref 4.0–10.5)
nRBC: 0 % (ref 0.0–0.2)

## 2018-04-18 NOTE — Patient Instructions (Signed)
[place] at [facility] Discharge Instructions  You were seen today by Dr. Delton Coombes. He went over your recent lab results, everything looks good. He will see you back in 4 months for labs and follow up.   Thank you for choosing [place] at [facility] to provide your oncology and hematology care.  To afford each patient quality time with our provider, please arrive at least 15 minutes before your scheduled appointment time.   If you have a lab appointment with the [facility] please come in thru the  Main Entrance and check in at the main information desk  You need to re-schedule your appointment should you arrive 10 or more minutes late.  We strive to give you quality time with our providers, and arriving late affects you and other patients whose appointments are after yours.  Also, if you no show three or more times for appointments you may be dismissed from the clinic at the providers discretion.     Again, thank you for choosing [facility].  Our hope is that these requests will decrease the amount of time that you wait before being seen by our physicians.       _____________________________________________________________  Should you have questions after your visit to [facility], please contact our office at [phone] between the hours of 8:00 a.m. and 4:30 p.m.  Voicemails left after 4:00 p.m. will not be returned until the following business day.  For prescription refill requests, have your pharmacy contact our office and allow 72 hours.    Cancer Center Support Programs:   > Cancer Support Group  2nd Tuesday of the month 1pm-2pm, Journey Room

## 2018-04-18 NOTE — Progress Notes (Signed)
[place] [address] [place] [address]   CLINIC:  Medical Oncology/Hematology  PCP:  Celene Squibb, MD [place] [address] [phone]   REASON FOR VISIT:  Follow-up for  thrombocytopenia  CURRENT THERAPY: Observation      INTERVAL HISTORY:  Rachel Gross 53 y.o. female returns for routine follow-up. She is here today alone. She states that she is on her 2 month of Epclusa. Denies any nausea, vomiting, or diarrhea. Denies any new pains. Had not noticed any recent bleeding such as epistaxis, hematuria or hematochezia. Denies recent chest pain on exertion, shortness of breath on minimal exertion, pre-syncopal episodes, or palpitations. Denies any numbness or tingling in hands or feet. Denies any recent fevers, infections, or recent hospitalizations. Patient reports appetite at 100% and energy level at 100%.    REVIEW OF SYSTEMS:  Review of Systems  Neurological: Positive for headaches.  All other systems reviewed and are negative.    PAST MEDICAL/SURGICAL HISTORY:  Past Medical History:  Diagnosis Date  . Alcoholic hepatitis without ascites   . 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 COPD (chronic obstructive pulmonary disease) ([place])   . Emphysema lung ([place])   . Hepatitis C   . History of HPV infection   . History of pneumonia   . Smoker    Past Surgical History:  Procedure Laterality Date  . CESAREAN SECTION    . IR US GUIDE VASC ACCESS RIGHT  03/13/2018  . TOOTH EXTRACTION  01/31/2018   x 7     SOCIAL HISTORY:  Social History   Socioeconomic History  . Marital status: Single    Spouse name: Not on file  . Number of children: Not on file  . Years of education: Not on file  . Highest education level: Not on file  Occupational History  . Not on file  Social Needs  . Financial resource strain: Not on file  . Food insecurity:    Worry: Not on file    Inability: Not on file  .  Transportation needs:    Medical: Not on file    Non-medical: Not on file  Tobacco Use  . Smoking status: Current Some Day Smoker    Packs/day: 0.50    Years: 38.00    Pack years: 19.00    Types: Cigarettes  . Smokeless tobacco: Never Used  Substance and Sexual Activity  . Alcohol use: Not Currently    Comment: Last use of alcohol 03/2016  . Drug use: Not Currently  . Sexual activity: Not on file  Lifestyle  . Physical activity:    Days per week: Not on file    Minutes per session: Not on file  . Stress: Not on file  Relationships  . Social connections:    Talks on phone: Not on file    Gets together: Not on file    Attends religious service: Not on file    Active member of club or organization: Not on file    Attends meetings of clubs or organizations: Not on file    Relationship status: Not on file  . Intimate partner violence:    Fear of current or ex partner: Not on file    Emotionally abused: Not on file    Physically abused: Not on file    Forced sexual activity: Not on file  Other Topics Concern  . Not on file  Social History Narrative  .  Not on file    FAMILY HISTORY:  Family History  Problem Relation Age of Onset  . Congenital heart disease Mother   . Bipolar disorder Mother   . Prostate cancer Brother   . Alzheimer's disease Paternal Grandmother   . Colon cancer Neg Hx     CURRENT MEDICATIONS:  Outpatient Encounter Medications as of 04/18/2018  Medication Sig  . CHANTIX STARTING MONTH PAK 0.5 MG X 11 & 1 MG X 42 tablet TK AS DIRECTED  . divalproex (DEPAKOTE) 500 MG DR tablet Take 500 mg by mouth daily.   . risperiDONE (RISPERDAL) 2 MG tablet Take 2 mg by mouth at bedtime.  . Sofosbuvir-Velpatasvir (EPCLUSA PO) Take by mouth daily.   No facility-administered encounter medications on file as of 04/18/2018.     ALLERGIES:  Allergies  Allergen Reactions  . Amoxicillin     hallucinations  . Fish Allergy Nausea Only  . Penicillins     Throat swelled      PHYSICAL EXAM:  ECOG Performance status: 1  Vitals:   04/18/18 1247  BP: (!) 98/59  Pulse: 79  Resp: 18  Temp: 98.4 F (36.9 C)  SpO2: 99%   Filed Weights   04/18/18 1247  Weight: 157 lb (71.2 kg)    Physical Exam Constitutional:      Appearance: Normal appearance.  Cardiovascular:     Rate and Rhythm: Normal rate and regular rhythm.     Heart sounds: Normal heart sounds.  Pulmonary:     Effort: Pulmonary effort is normal.     Breath sounds: Normal breath sounds.  Abdominal:     General: Bowel sounds are normal.     Palpations: Abdomen is soft. There is no mass.  Musculoskeletal:        General: No swelling.  Neurological:     General: No focal deficit present.     Mental Status: She is alert and oriented to person, place, and time.  Psychiatric:        Mood and Affect: Mood normal.        Behavior: Behavior normal.      LABORATORY DATA:  I have reviewed the labs as listed.  CBC    Component Value Date/Time   WBC 5.6 04/18/2018 1157   RBC 3.98 04/18/2018 1157   HGB 13.5 04/18/2018 1157   HCT 39.2 04/18/2018 1157   PLT 94 (L) 04/18/2018 1157   MCV 98.5 04/18/2018 1157   MCH 33.9 04/18/2018 1157   MCHC 34.4 04/18/2018 1157   RDW 12.8 04/18/2018 1157   LYMPHSABS 2.9 04/18/2018 1157   MONOABS 0.5 04/18/2018 1157   EOSABS 0.1 04/18/2018 1157   BASOSABS 0.0 04/18/2018 1157   CMP Latest Ref Rng & Units 04/18/2018 01/17/2018 11/28/2017  Glucose 70 - 99 mg/dL 117(H) 113(H) 91  BUN 6 - 20 mg/dL 7 <5(L) 5(L)  Creatinine 0.44 - 1.00 mg/dL 0.47 0.46 0.40(L)  Sodium 135 - 145 mmol/L 134(L) 139 131(L)  Potassium 3.5 - 5.1 mmol/L 4.0 3.5 3.8  Chloride 98 - 111 mmol/L 99 106 97(L)  CO2 22 - 32 mmol/L 26 24 24   Calcium 8.9 - 10.3 mg/dL 9.3 9.0 9.1  Total Protein 6.5 - 8.1 g/dL 7.9 7.5 7.7  Total Bilirubin 0.3 - 1.2 mg/dL 0.7 0.5 0.8  Alkaline Phos 38 - 126 U/L 57 59 64  AST 15 - 41 U/L 31 141(H) 241(H)  ALT 0 - 44 U/L 19 105(H) 201(H)  DIAGNOSTIC IMAGING:  I have independently reviewed the scans and discussed with the patient.   I have reviewed Venita Lick LPN's note and agree with the documentation.  I personally performed a face-to-face visit, made revisions and my assessment and plan is as follows.    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 Juel Burrow office from April 2019 shows platelet count of 49.  I have reviewed medical records from [place], her platelet count was 127.  We have done work-up for thrombocytopenia.  Her platelet count dropped to 33.  Lupus anticoagulant and HIV test were negative. -Ultrasound of the abdomen on 05/31/2017 shows hepatic cirrhosis, normal sized spleen. - We have given a trial of pulse dexamethasone 40 mg for 4 days, on 06/14/2017.  Patient tolerated dexamethasone very well.  She felt better because of her energy level improvement.  Her platelet count improved to 81 from 33. - She was hospitalized in July 6629 with metabolic and colopathy with elevated ammonia levels.  This happened after she was started on treatment with amoxicillin and codeine after teeth infection.  Platelet count dropped to 28 while she was inpatient.  She received dexamethasone for 4 days with resultant improvement in platelet count.  - CT abdomen with and without contrast on 03/13/2018 shows hepatic cirrhosis without discrete hepatic mass.  1.1 x 0.9 x 0.7 cm right lower lobe pulmonary nodule.  Repeat chest CT was recommended in 3 months. - She denied any bleeding or easy bruising. -I have reviewed her blood work.  Platelet count is stable at 94.  LFTs have normalized.  She will come back in 4 months with repeat blood work.  2.  Hep C infection: -She is seeing a hematologist in [place].  She was started on Epclusa approximately 2 months ago.  She is tolerating it well except occasional headaches.       Orders placed this encounter:   No orders of the defined types were placed in this encounter.     Derek Jack, MD [place] [phone]

## 2018-04-18 NOTE — Assessment & Plan Note (Signed)
1.  Immune thrombocytopenia: -Recent blood work at Dr. Juel Burrow office from April 2019 shows platelet count of 49.  I have reviewed medical records from [place], her platelet count was 127.  We have done work-up for thrombocytopenia.  Her platelet count dropped to 33.  Lupus anticoagulant and HIV test were negative. -Ultrasound of the abdomen on 05/31/2017 shows hepatic cirrhosis, normal sized spleen. - We have given a trial of pulse dexamethasone 40 mg for 4 days, on 06/14/2017.  Patient tolerated dexamethasone very well.  She felt better because of her energy level improvement.  Her platelet count improved to 81 from 33. - She was hospitalized in July 1117 with metabolic and colopathy with elevated ammonia levels.  This happened after she was started on treatment with amoxicillin and codeine after teeth infection.  Platelet count dropped to 28 while she was inpatient.  She received dexamethasone for 4 days with resultant improvement in platelet count.  - CT abdomen with and without contrast on 03/13/2018 shows hepatic cirrhosis without discrete hepatic mass.  1.1 x 0.9 x 0.7 cm right lower lobe pulmonary nodule.  Repeat chest CT was recommended in 3 months. - She denied any bleeding or easy bruising. -I have reviewed her blood work.  Platelet count is stable at 94.  LFTs have normalized.  She will come back in 4 months with repeat blood work.  2.  Hep C infection: -She is seeing a hematologist in [place].  She was started on Epclusa approximately 2 months ago.  She is tolerating it well except occasional headaches.

## 2018-04-25 ENCOUNTER — Encounter (HOSPITAL_COMMUNITY)
Admission: RE | Admit: 2018-04-25 | Discharge: 2018-04-25 | Disposition: A | Discharge status: Home / Self Care | Payer: Medicaid Other | Source: Ambulatory Visit | Attending: Internal Medicine | Admitting: Internal Medicine

## 2018-04-25 ENCOUNTER — Encounter (HOSPITAL_COMMUNITY): Payer: Self-pay

## 2018-04-25 ENCOUNTER — Other Ambulatory Visit: Payer: Self-pay

## 2018-05-02 ENCOUNTER — Ambulatory Visit (HOSPITAL_COMMUNITY): Payer: Medicaid Other | Admitting: Anesthesiology

## 2018-05-02 ENCOUNTER — Encounter (HOSPITAL_COMMUNITY): Payer: Self-pay

## 2018-05-02 ENCOUNTER — Other Ambulatory Visit: Payer: Self-pay

## 2018-05-02 ENCOUNTER — Encounter (HOSPITAL_COMMUNITY)
Admission: RE | Disposition: A | Discharge status: Home / Self Care | Payer: Self-pay | Source: Home / Self Care | Attending: Internal Medicine

## 2018-05-02 ENCOUNTER — Ambulatory Visit (HOSPITAL_COMMUNITY)
Admission: RE | Admit: 2018-05-02 | Discharge: 2018-05-02 | Disposition: A | Discharge status: Home / Self Care | Payer: Medicaid Other | Attending: Internal Medicine | Admitting: Internal Medicine

## 2018-05-02 DIAGNOSIS — K222 Esophageal obstruction: Secondary | ICD-10-CM | POA: Diagnosis not present

## 2018-05-02 DIAGNOSIS — K766 Portal hypertension: Secondary | ICD-10-CM

## 2018-05-02 DIAGNOSIS — Z13810 Encounter for screening for upper gastrointestinal disorder: Secondary | ICD-10-CM | POA: Insufficient documentation

## 2018-05-02 DIAGNOSIS — K2950 Unspecified chronic gastritis without bleeding: Secondary | ICD-10-CM | POA: Insufficient documentation

## 2018-05-02 DIAGNOSIS — Z87891 Personal history of nicotine dependence: Secondary | ICD-10-CM | POA: Insufficient documentation

## 2018-05-02 DIAGNOSIS — K210 Gastro-esophageal reflux disease with esophagitis: Secondary | ICD-10-CM | POA: Insufficient documentation

## 2018-05-02 DIAGNOSIS — J439 Emphysema, unspecified: Secondary | ICD-10-CM | POA: Insufficient documentation

## 2018-05-02 DIAGNOSIS — Z79899 Other long term (current) drug therapy: Secondary | ICD-10-CM | POA: Diagnosis not present

## 2018-05-02 DIAGNOSIS — B1920 Unspecified viral hepatitis C without hepatic coma: Secondary | ICD-10-CM | POA: Insufficient documentation

## 2018-05-02 DIAGNOSIS — K3189 Other diseases of stomach and duodenum: Secondary | ICD-10-CM

## 2018-05-02 DIAGNOSIS — I10 Essential (primary) hypertension: Secondary | ICD-10-CM | POA: Diagnosis not present

## 2018-05-02 DIAGNOSIS — K7460 Unspecified cirrhosis of liver: Secondary | ICD-10-CM

## 2018-05-02 HISTORY — PX: ESOPHAGOGASTRODUODENOSCOPY (EGD) WITH PROPOFOL: SHX5813

## 2018-05-02 HISTORY — PX: BIOPSY: SHX5522

## 2018-05-02 SURGERY — ESOPHAGOGASTRODUODENOSCOPY (EGD) WITH PROPOFOL
Anesthesia: General

## 2018-05-02 MED ORDER — CHLORHEXIDINE GLUCONATE CLOTH 2 % EX PADS: 6.0000 | MEDICATED_PAD | Freq: Once | CUTANEOUS | Status: DC

## 2018-05-02 MED ORDER — PROPOFOL 10 MG/ML IV BOLUS
INTRAVENOUS | Status: DC | PRN
  Administered 2018-05-02 (×4): 20 mg via INTRAVENOUS

## 2018-05-02 MED ORDER — LACTATED RINGERS IV SOLN
INTRAVENOUS | Status: DC
  Administered 2018-05-02: 09:00:00 via INTRAVENOUS

## 2018-05-02 MED ORDER — PROPOFOL 500 MG/50ML IV EMUL
INTRAVENOUS | Status: DC | PRN
  Administered 2018-05-02: 150 ug/kg/min via INTRAVENOUS

## 2018-05-02 NOTE — Anesthesia Postprocedure Evaluation (Signed)
Anesthesia Post Note  Patient: Rachel Gross  Procedure(s) Performed: ESOPHAGOGASTRODUODENOSCOPY (EGD) WITH PROPOFOL (N/A ) BIOPSY  Patient location during evaluation: PACU Anesthesia Type: General Level of consciousness: awake and alert and oriented Pain management: pain level controlled Vital Signs Assessment: post-procedure vital signs reviewed and stable Respiratory status: spontaneous breathing Cardiovascular status: stable Postop Assessment: no apparent nausea or vomiting Anesthetic complications: no     Last Vitals:  Vitals:   05/02/18 0740 05/02/18 0915  BP: 106/62 (!) 140/51  Pulse: 74 66  Resp: (!) 22 17  Temp: 36.8 C 36.7 C  SpO2: 99% 98%    Last Pain:  Vitals:   05/02/18 0915  TempSrc: Oral  PainSc: 0-No pain                 Maritza Hosterman

## 2018-05-02 NOTE — Anesthesia Preprocedure Evaluation (Addendum)
Anesthesia Evaluation  Patient identified by MRN, date of birth, ID band Patient awake    Reviewed: Allergy & Precautions, NPO status , Patient's Chart, lab work & pertinent test results  Airway Mallampati: II  TM Distance: >3 FB Neck ROM: Full    Dental no notable dental hx. (+) Partial Upper Plate out:   Pulmonary COPD, former smoker,  Denies any o2 or inhaler use  Reports pneumonia in 2 /2018 Ex smoker    Pulmonary exam normal breath sounds clear to auscultation       Cardiovascular Exercise Tolerance: Poor hypertension, Pt. on medications Normal cardiovascular examII Rhythm:Regular Rate:Normal     Neuro/Psych negative neurological ROS  negative psych ROS   GI/Hepatic negative GI ROS, (+) Hepatitis -, B, CHep C under [place] now  Reports last encephalopathy was 07/2017    Endo/Other  negative endocrine ROS  Renal/GU negative Renal ROS  negative genitourinary   Musculoskeletal negative musculoskeletal ROS (+)   Abdominal   Peds negative pediatric ROS (+)  Hematology negative hematology ROS (+)   Anesthesia Other Findings   Reproductive/Obstetrics negative OB ROS                            Anesthesia Physical Anesthesia Plan  ASA: III  Anesthesia Plan: General   Post-op Pain Management:    Induction:   PONV Risk Score and Plan:   Airway Management Planned: Nasal Cannula and Simple Face Mask  Additional Equipment:   Intra-op Plan:   Post-operative Plan:   Informed Consent:   Plan Discussed with:   Anesthesia Plan Comments: (Deemed appropriate by Dr. Alfonso Patten in light of current covid concerns )        Anesthesia Quick Evaluation

## 2018-05-02 NOTE — Discharge Instructions (Signed)
EGD Discharge instructions Please read the instructions outlined below and refer to this sheet in the next few weeks. These discharge instructions provide you with general information on caring for yourself after you leave the hospital. Your doctor may also give you specific instructions. While your treatment has been planned according to the most current medical practices available, unavoidable complications occasionally occur. If you have any problems or questions after discharge, please call your doctor. ACTIVITY  You may resume your regular activity but move at a slower pace for the next 24 hours.   Take frequent rest periods for the next 24 hours.   Walking will help expel (get rid of) the air and reduce the bloated feeling in your abdomen.   No driving for 24 hours (because of the anesthesia (medicine) used during the test).   You may shower.   Do not sign any important legal documents or operate any machinery for 24 hours (because of the anesthesia used during the test).  NUTRITION  Drink plenty of fluids.   You may resume your normal diet.   Begin with a light meal and progress to your normal diet.   Avoid alcoholic beverages for 24 hours or as instructed by your caregiver.  MEDICATIONS  You may resume your normal medications unless your caregiver tells you otherwise.  WHAT YOU CAN EXPECT TODAY  You may experience abdominal discomfort such as a feeling of fullness or gas pains.  FOLLOW-UP  Your doctor will discuss the results of your test with you.  SEEK IMMEDIATE MEDICAL ATTENTION IF ANY OF THE FOLLOWING OCCUR:  Excessive nausea (feeling sick to your stomach) and/or vomiting.   Severe abdominal pain and distention (swelling).   Trouble swallowing.   Temperature over 101 F (37.8 C).   Rectal bleeding or vomiting of blood.   Gastroesophageal Reflux Disease, Adult Gastroesophageal reflux (GER) happens when acid from the stomach flows up into the tube that  connects the mouth and the stomach (esophagus). Normally, food travels down the esophagus and stays in the stomach to be digested. With GER, food and stomach acid sometimes move back up into the esophagus. You may have a disease called gastroesophageal reflux disease (GERD) if the reflux: Happens often. Causes frequent or very bad symptoms. Causes problems such as damage to the esophagus. When this happens, the esophagus becomes sore and swollen (inflamed). Over time, GERD can make small holes (ulcers) in the lining of the esophagus. What are the causes? This condition is caused by a problem with the muscle between the esophagus and the stomach. When this muscle is weak or not normal, it does not close properly to keep food and acid from coming back up from the stomach. The muscle can be weak because of: Tobacco use. Pregnancy. Having a certain type of hernia (hiatal hernia). Alcohol use. Certain foods and drinks, such as coffee, chocolate, onions, and peppermint. What increases the risk? You are more likely to develop this condition if you: Are overweight. Have a disease that affects your connective tissue. Use NSAID medicines. What are the signs or symptoms? Symptoms of this condition include: Heartburn. Difficult or painful swallowing. The feeling of having a lump in the throat. A bitter taste in the mouth. Bad breath. Having a lot of saliva. Having an upset or bloated stomach. Belching. Chest pain. Different conditions can cause chest pain. Make sure you see your doctor if you have chest pain. Shortness of breath or noisy breathing (wheezing). Ongoing (chronic) cough or a cough at  night. Wearing away of the surface of teeth (tooth enamel). Weight loss. How is this treated? Treatment will depend on how bad your symptoms are. Your doctor may suggest: Changes to your diet. Medicine. Surgery. Follow these instructions at home: Eating and drinking  Follow a diet as told by your  doctor. You may need to avoid foods and drinks such as: Coffee and tea (with or without caffeine). Drinks that contain alcohol. Energy drinks and sports drinks. Bubbly (carbonated) drinks or sodas. Chocolate and cocoa. Peppermint and mint flavorings. Garlic and onions. Horseradish. Spicy and acidic foods. These include peppers, chili powder, curry powder, vinegar, hot sauces, and BBQ sauce. Citrus fruit juices and citrus fruits, such as oranges, lemons, and limes. Tomato-based foods. These include red sauce, chili, salsa, and pizza with red sauce. Fried and fatty foods. These include donuts, french fries, potato chips, and high-fat dressings. High-fat meats. These include hot dogs, rib eye steak, sausage, ham, and bacon. High-fat dairy items, such as whole milk, butter, and cream cheese. Eat small meals often. Avoid eating large meals. Avoid drinking large amounts of liquid with your meals. Avoid eating meals during the 2-3 hours before bedtime. Avoid lying down right after you eat. Do not exercise right after you eat. Lifestyle  Do not use any products that contain nicotine or tobacco. These include cigarettes, e-cigarettes, and chewing tobacco. If you need help quitting, ask your doctor. Try to lower your stress. If you need help doing this, ask your doctor. If you are overweight, lose an amount of weight that is healthy for you. Ask your doctor about a safe weight loss goal. General instructions Pay attention to any changes in your symptoms. Take over-the-counter and prescription medicines only as told by your doctor. Do not take aspirin, ibuprofen, or other NSAIDs unless your doctor says it is okay. Wear loose clothes. Do not wear anything tight around your waist. Raise (elevate) the head of your bed about 6 inches (15 cm). Avoid bending over if this makes your symptoms worse. Keep all follow-up visits as told by your doctor. This is important. Contact a doctor if: You have new  symptoms. You lose weight and you do not know why. You have trouble swallowing or it hurts to swallow. You have wheezing or a cough that keeps happening. Your symptoms do not get better with treatment. You have a hoarse voice. Get help right away if: You have pain in your arms, neck, jaw, teeth, or back. You feel sweaty, dizzy, or light-headed. You have chest pain or shortness of breath. You throw up (vomit) and your throw-up looks like blood or coffee grounds. You pass out (faint). Your poop (stool) is bloody or black. You cannot swallow, drink, or eat. Summary If a person has gastroesophageal reflux disease (GERD), food and stomach acid move back up into the esophagus and cause symptoms or problems such as damage to the esophagus. Treatment will depend on how bad your symptoms are. Follow a diet as told by your doctor. Take all medicines only as told by your doctor. This information is not intended to replace advice given to you by your health care provider. Make sure you discuss any questions you have with your health care provider. Document Released: 07/05/2007 Document Revised: 07/25/2017 Document Reviewed: 07/25/2017 Elsevier Interactive Patient Education  2019 [place], Care After These instructions provide you with information about caring for yourself after your procedure. Your health care provider may also give you more  specific instructions. Your treatment has been planned according to current medical practices, but problems sometimes occur. Call your health care provider if you have any problems or questions after your procedure. What can I expect after the procedure? After your procedure, you may:  Feel sleepy for several hours.  Feel clumsy and have poor balance for several hours.  Feel forgetful about what happened after the procedure.  Have poor judgment for several hours.  Feel nauseous or vomit.  Have a sore throat if you  had a breathing tube during the procedure. Follow these instructions at home: For at least 24 hours after the procedure:      Have a responsible adult stay with you. It is important to have someone help care for you until you are awake and alert.  Rest as needed.  Do not: ? Participate in activities in which you could fall or become injured. ? Drive. ? Use heavy machinery. ? Drink alcohol. ? Take sleeping pills or medicines that cause drowsiness. ? Make important decisions or sign legal documents. ? Take care of children on your own. Eating and drinking  Follow the diet that is recommended by your health care provider.  If you vomit, drink water, juice, or soup when you can drink without vomiting.  Make sure you have little or no nausea before eating solid foods. General instructions  Take over-the-counter and prescription medicines only as told by your health care provider.  If you have sleep apnea, surgery and certain medicines can increase your risk for breathing problems. Follow instructions from your health care provider about wearing your sleep device: ? Anytime you are sleeping, including during daytime naps. ? While taking prescription pain medicines, sleeping medicines, or medicines that make you drowsy.  If you smoke, do not smoke without supervision.  Keep all follow-up visits as told by your health care provider. This is important. Contact a health care provider if:  You keep feeling nauseous or you keep vomiting.  You feel light-headed.  You develop a rash.  You have a fever. Get help right away if:  You have trouble breathing. Summary  For several hours after your procedure, you may feel sleepy and have poor judgment.  Have a responsible adult stay with you for at least 24 hours or until you are awake and alert. This information is not intended to replace advice given to you by your health care provider. Make sure you discuss any questions you have  with your health care provider. Document Released: 05/09/2015 Document Revised: 09/01/2016 Document Reviewed: 05/09/2015 Elsevier Interactive Patient Education  2019 [employer].   GERD information provided  Begin Protonix 40 mg daily  Office visit with [place] in 3 months  Repeat EGD in 2 years  Further recommendations to follow pending review of pathology report

## 2018-05-02 NOTE — Op Note (Signed)
[facility] Patient Name: Rachel Gross Procedure Date: 05/02/2018 8:16 AM MRN: [id number] Date of Birth: [birth year] Attending MD: Norvel Richards , MD CSN: [id number] Age: 53 Admit Type: Outpatient Procedure:                Upper GI endoscopy Indications:              Screening procedure Providers:                Norvel Richards, MD, Jeanann Lewandowsky. Sharon Seller, RN,                            Raphael Gibney Tech., Technician, Aram Candela Referring MD:              Medicines:                Propofol per Anesthesia Complications:            No immediate complications. Estimated Blood Loss:     Estimated blood loss was minimal. Procedure:                Pre-Anesthesia Assessment:                           - Prior to the procedure, a History and Physical                            was performed, and patient medications and                            allergies were reviewed. The patient's tolerance of                            previous anesthesia was also reviewed. The risks                            and benefits of the procedure and the sedation                            options and risks were discussed with the patient.                            All questions were answered, and informed consent                            was obtained. Prior Anticoagulants: The patient has                            taken no previous anticoagulant or antiplatelet                            agents. ASA Grade Assessment: III - A patient with                            severe systemic disease. After reviewing the risks  and benefits, the patient was deemed in                            satisfactory condition to undergo the procedure.                           After obtaining informed consent, the endoscope was                            passed under direct vision. Throughout the                            procedure, the patient's blood pressure, pulse, and               oxygen saturations were monitored continuously. The                            GIF-H190 ([id number]) scope was introduced through the                            and advanced to the second part of duodenum. The                            upper GI endoscopy was accomplished without                            difficulty. The patient tolerated the procedure                            well. Scope In: 8:58:24 AM Scope Out: 9:02:29 AM Total Procedure Duration: 0 hours 4 minutes 5 seconds  Findings:      Esophagitis was found. Single 8 mm linear erosions straddling the GE       junction. No Barrett's epithelium seen. No esophageal varices.      A mild Schatzki ring was found at the gastroesophageal junction.       Nonobstructing.      Portal hypertensive gastropathy was found in the entire examined stomach.      A multiple erosion was found in the gastric antrum. No ulcer or       infiltrating process seen. Apices of the gastric mucosa taken for       histologic study.      The duodenal bulb and second portion of the duodenum were normal. Impression:               -Mild erosive reflux esophagitis                           - Mild Schatzki ring.                           - Portal hypertensive gastropathy.                           - Erosive gastropathy. Status post biopsy                           -  Normal duodenal bulb and second portion of the                            duodenum.                           - Moderate Sedation:      Moderate (conscious) sedation was personally administered by an       anesthesia professional. The following parameters were monitored: oxygen       saturation, heart rate, blood pressure, respiratory rate, EKG, adequacy       of pulmonary ventilation, and response to care. Recommendation:           - Patient has a contact number available for                            emergencies. The signs and symptoms of potential                            delayed  complications were discussed with the                            patient. Return to normal activities tomorrow.                            Written discharge instructions were provided to the                            patient.                           - Advance diet as tolerated.                           - Continue present medications. Begin Protonix 40                            mg daily. Follow-up on pathology.                           My findings and recommendations were discussed with                            the patient's father, Richardson Landry, at [phone]                           Repeat upper endoscopy in 2 years for screening                            purposes.                           - Return to GI clinic in 3 months. Procedure Code(s):        --- Professional ---                           [phone], Esophagogastroduodenoscopy,  flexible,                            transoral; diagnostic, including collection of                            specimen(s) by brushing or washing, when performed                            (separate procedure) Diagnosis Code(s):        --- Professional ---                           K20.9, Esophagitis, unspecified                           K22.2, Esophageal obstruction                           K76.6, Portal hypertension                           K31.89, Other diseases of stomach and duodenum                           Z13.810, Encounter for screening for upper                            gastrointestinal disorder CPT copyright 2019 [employer]. All rights reserved. The codes documented in this report are preliminary and upon coder review may  be revised to meet current compliance requirements. Cristopher Estimable. Elwin Tsou, MD Norvel Richards, MD 05/02/2018 9:16:21 AM This report has been signed electronically. Number of Addenda: 0

## 2018-05-02 NOTE — Addendum Note (Signed)
Addendum  created 05/02/18 1341 by Mickel Baas, CRNA   Charge Capture section accepted

## 2018-05-02 NOTE — Transfer of Care (Signed)
Immediate Anesthesia Transfer of Care Note  Patient: Rachel Gross  Procedure(s) Performed: ESOPHAGOGASTRODUODENOSCOPY (EGD) WITH PROPOFOL (N/A ) BIOPSY  Patient Location: Short Stay  Anesthesia Type:General  Level of Consciousness: awake and alert   Airway & Oxygen Therapy: Patient Spontanous Breathing  Post-op Assessment: Report given to RN  Post vital signs: Reviewed and stable  Last Vitals:  Vitals Value Taken Time  BP 140/51 05/02/2018  9:15 AM  Temp 36.7 C 05/02/2018  9:15 AM  Pulse 66 05/02/2018  9:15 AM  Resp 17 05/02/2018  9:15 AM  SpO2 98 % 05/02/2018  9:15 AM    Last Pain:  Vitals:   05/02/18 0915  TempSrc: Oral  PainSc: 0-No pain      Patients Stated Pain Goal: 5 (38/25/05 3976)  Complications: No apparent anesthesia complications

## 2018-05-02 NOTE — H&P (Signed)
@LOGO @   Primary Care Physician:  Celene Squibb, MD Primary Gastroenterologist:  Dr. Gala Romney  Pre-Procedure History & Physical: HPI:  Rachel Gross is a 53 y.o. female here for to check for esophageal varices.  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  Completing antiviral therapy at this time.  No upper GI tract symptoms.  Specifically, no GERD no dysphagia.  Past Medical History:  Diagnosis Date  . Alcoholic hepatitis without ascites   . 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 COPD (chronic obstructive pulmonary disease) ([place])   . Emphysema lung ([place])   . Hepatitis C   . History of HPV infection   . History of pneumonia   . Smoker     Past Surgical History:  Procedure Laterality Date  . CESAREAN SECTION    . IR US GUIDE VASC ACCESS RIGHT  03/13/2018  . TOOTH EXTRACTION  01/31/2018   x 7    Prior to Admission medications   Medication Sig Start Date End Date Taking? Authorizing Provider  divalproex (DEPAKOTE) 500 MG DR tablet Take 500 mg by mouth every evening.    Yes [provider]  EPCLUSA 400-100 MG TABS Take 1 tablet by mouth daily. 03/26/18  Yes [provider]  risperiDONE (RISPERDAL) 1 MG tablet Take 1 mg by mouth at bedtime.   Yes [provider]  varenicline (CHANTIX) 1 MG tablet Take 1 mg by mouth 2 (two) times daily.   Yes [provider]    Allergies as of 03/06/2018 - Review Complete 03/06/2018  Allergen Reaction Noted  . Amoxicillin  09/10/2017  . Fish allergy Nausea Only 05/24/2017  . Penicillins  09/10/2017    Family History  Problem Relation Age of Onset  . Congenital heart disease Mother   . Bipolar disorder Mother   . Prostate cancer Brother   . Alzheimer's disease Paternal Grandmother   . Colon cancer Neg Hx     Social History   Socioeconomic History  . Marital status: Single    Spouse name: Not on file  . Number of children: Not on file  . Years of education: Not on file  .  Highest education level: Not on file  Occupational History  . Not on file  Social Needs  . Financial resource strain: Not on file  . Food insecurity:    Worry: Not on file    Inability: Not on file  . Transportation needs:    Medical: Not on file    Non-medical: Not on file  Tobacco Use  . Smoking status: Former Smoker    Packs/day: 0.50    Years: 38.00    Pack years: 19.00    Types: Cigarettes  . Smokeless tobacco: Never Used  . Tobacco comment: quit day was March 24th,2020 : high risk of starting back per pt  Substance and Sexual Activity  . Alcohol use: Not Currently    Comment: Last use of alcohol 03/2016  . Drug use: Not Currently  . Sexual activity: Not on file  Lifestyle  . Physical activity:    Days per week: Not on file    Minutes per session: Not on file  . Stress: Not on file  Relationships  . Social connections:    Talks on phone: Not on file    Gets together: Not on file    Attends religious service: Not on file    Active member of club or organization: Not on file    Attends  meetings of clubs or organizations: Not on file    Relationship status: Not on file  . Intimate partner violence:    Fear of current or ex partner: Not on file    Emotionally abused: Not on file    Physically abused: Not on file    Forced sexual activity: Not on file  Other Topics Concern  . Not on file  Social History Narrative  . Not on file    Review of Systems: See HPI, otherwise negative ROS  Physical Exam: BP 106/62   Pulse 74   Temp 98.3 F (36.8 C) (Oral)   Resp (!) 22   SpO2 99%  General:   Alert,  Well-developed, well-nourished, pleasant and cooperative in NAD Eyes:  Sclera clear, no icterus.   Conjunctiva pink. Neck:  Supple; no masses or thyromegaly. No significant cervical adenopathy. Lungs:  Clear throughout to auscultation.   No wheezes, crackles, or rhonchi. No acute distress. Heart:  Regular rate and rhythm; no murmurs, clicks, rubs,  or  gallops. Abdomen: Non-distended, normal bowel sounds.  Soft and nontender without appreciable mass or hepatosplenomegaly.   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  No prior EGD.  EGD today to assess for esophageal varices per plan.  She has no upper GI tract symptoms.  The risks, benefits, limitations, alternatives and imponderables have been reviewed with the patient. Potential for esophageal dilation, biopsy, etc. have also been reviewed.  Questions have been answered. All parties agreeable.     Notice: This dictation was prepared with Dragon dictation along with smaller phrase technology. Any transcriptional errors that result from this process are unintentional and may not be corrected upon review.

## 2018-05-05 ENCOUNTER — Encounter: Payer: Self-pay | Admitting: Internal Medicine

## 2018-05-08 ENCOUNTER — Encounter (HOSPITAL_COMMUNITY): Payer: Self-pay | Admitting: Internal Medicine

## 2018-06-05 ENCOUNTER — Other Ambulatory Visit: Payer: Self-pay | Admitting: Internal Medicine

## 2018-06-05 DIAGNOSIS — R911 Solitary pulmonary nodule: Secondary | ICD-10-CM

## 2018-08-09 ENCOUNTER — Other Ambulatory Visit (HOSPITAL_COMMUNITY): Payer: Self-pay | Admitting: *Deleted

## 2018-08-09 DIAGNOSIS — D693 Immune thrombocytopenic purpura: Secondary | ICD-10-CM

## 2018-08-09 DIAGNOSIS — D696 Thrombocytopenia, unspecified: Secondary | ICD-10-CM

## 2018-08-12 ENCOUNTER — Other Ambulatory Visit: Payer: Self-pay

## 2018-08-12 ENCOUNTER — Inpatient Hospital Stay (HOSPITAL_COMMUNITY): Payer: Medicaid Other | Attending: Hematology

## 2018-08-12 DIAGNOSIS — K7460 Unspecified cirrhosis of liver: Secondary | ICD-10-CM | POA: Insufficient documentation

## 2018-08-12 DIAGNOSIS — D696 Thrombocytopenia, unspecified: Secondary | ICD-10-CM

## 2018-08-12 DIAGNOSIS — D693 Immune thrombocytopenic purpura: Secondary | ICD-10-CM

## 2018-08-12 DIAGNOSIS — B1920 Unspecified viral hepatitis C without hepatic coma: Secondary | ICD-10-CM | POA: Insufficient documentation

## 2018-08-12 LAB — COMPREHENSIVE METABOLIC PANEL
ALT: 32 U/L (ref 0–44)
AST: 68 U/L — ABNORMAL HIGH (ref 15–41)
Albumin: 4.1 g/dL (ref 3.5–5.0)
Alkaline Phosphatase: 79 U/L (ref 38–126)
Anion gap: 14 (ref 5–15)
BUN: 8 mg/dL (ref 6–20)
CO2: 22 mmol/L (ref 22–32)
Calcium: 9.6 mg/dL (ref 8.9–10.3)
Chloride: 100 mmol/L (ref 98–111)
Creatinine, Ser: 0.52 mg/dL (ref 0.44–1.00)
GFR calc Af Amer: >60 mL/min (ref >60)
GFR calc non Af Amer: >60 mL/min (ref >60)
Glucose, Bld: 88 mg/dL (ref 70–99)
Potassium: 5.3 mmol/L — ABNORMAL HIGH (ref 3.5–5.1)
Sodium: 136 mmol/L (ref 135–145)
Total Bilirubin: 0.9 mg/dL (ref 0.3–1.2)
Total Protein: 8.4 g/dL — ABNORMAL HIGH (ref 6.5–8.1)

## 2018-08-12 LAB — CBC WITH DIFFERENTIAL/PLATELET
Abs Immature Granulocytes: 0.02 10*3/uL (ref 0.00–0.07)
Basophils Absolute: 0 10*3/uL (ref 0.0–0.1)
Basophils Relative: 0 %
Eosinophils Absolute: 0 10*3/uL (ref 0.0–0.5)
Eosinophils Relative: 1 %
HCT: 45.2 % (ref 36.0–46.0)
Hemoglobin: 15 g/dL (ref 12.0–15.0)
Immature Granulocytes: 0 %
Lymphocytes Relative: 51 %
Lymphs Abs: 2.4 10*3/uL (ref 0.7–4.0)
MCH: 31.8 pg (ref 26.0–34.0)
MCHC: 33.2 g/dL (ref 30.0–36.0)
MCV: 96 fL (ref 80.0–100.0)
Monocytes Absolute: 0.4 10*3/uL (ref 0.1–1.0)
Monocytes Relative: 9 %
Neutro Abs: 1.8 10*3/uL (ref 1.7–7.7)
Neutrophils Relative %: 39 %
Platelets: 75 10*3/uL — ABNORMAL LOW (ref 150–400)
RBC: 4.71 MIL/uL (ref 3.87–5.11)
RDW: 12.9 % (ref 11.5–15.5)
WBC: 4.7 10*3/uL (ref 4.0–10.5)
nRBC: 0 % (ref 0.0–0.2)

## 2018-08-15 ENCOUNTER — Other Ambulatory Visit: Payer: Self-pay

## 2018-08-15 ENCOUNTER — Ambulatory Visit (HOSPITAL_COMMUNITY)
Admission: RE | Admit: 2018-08-15 | Discharge: 2018-08-15 | Disposition: A | Discharge status: Home / Self Care | Payer: Medicaid Other | Source: Ambulatory Visit | Attending: Internal Medicine | Admitting: Internal Medicine

## 2018-08-15 DIAGNOSIS — R911 Solitary pulmonary nodule: Secondary | ICD-10-CM

## 2018-08-15 MED ORDER — IOHEXOL 300 MG/ML  SOLN: 75.0000 mL | Freq: Once | INTRAMUSCULAR | Status: DC | PRN

## 2018-08-19 ENCOUNTER — Inpatient Hospital Stay (HOSPITAL_BASED_OUTPATIENT_CLINIC_OR_DEPARTMENT_OTHER): Payer: Medicaid Other | Admitting: Nurse Practitioner

## 2018-08-19 ENCOUNTER — Other Ambulatory Visit: Payer: Self-pay

## 2018-08-19 DIAGNOSIS — D693 Immune thrombocytopenic purpura: Secondary | ICD-10-CM

## 2018-08-19 DIAGNOSIS — K7460 Unspecified cirrhosis of liver: Secondary | ICD-10-CM

## 2018-08-19 DIAGNOSIS — B1920 Unspecified viral hepatitis C without hepatic coma: Secondary | ICD-10-CM | POA: Diagnosis not present

## 2018-08-19 NOTE — Assessment & Plan Note (Addendum)
1.  Immune thrombocytopenia: -Recent blood work at Dr. Juel Burrow office from April 2019 showed platelet count of 49.  She had medical records from [place] where her platelet count was 127.  We have done all the work-up for thrombocytopenia.  Her platelet count dropped to 33.  Lupus anticoagulant and HIV tests were negative. -Ultrasound the abdomen on 05/31/2017 shows hepatic cirrhosis, normal size spleen. -We have given a trial of dexamethasone 40 mg for 4 days, on 06/14/2017.  Patient tolerated dexamethasone very well.  She felt better because of the energy level has improved.  Her platelet count improved to 81 from 33. -She was hospitalized in July 4360 with metabolic and colopathy with elevated ammonia levels.  This happened after she started on treatment with amoxicillin and codeine after her tooth infection.  Platelet count dropped to 28 while she was inpatient.  She received dexamethasone for 4 days with with a reluctant improvement in platelet count. -CT of the abdomen with and without contrast on 03/13/2018 showed hepatic cirrhosis without discrete hepatic mass.  1.1 x 0.9 x 0.7 cm right lower lobe pulmonary nodule.  Repeat chest CT was recommended in 3 months. -She denies any bleeding or easy bruising. -Labs on 08/12/2018 showed her platelet count at 75.  Hemoglobin 15.0 WBC 4.7 potassium 5.3.  Calcium 9.6.  AST is slightly increased at 68. -She will follow-up in 4 months with repeat blood work.  2.  Hep C infection: -She is seeing a hematologist in [place]. -She was started on Epclusa sometime in January 2020.  She is tolerating it well except occasional headaches.  3.  Elevated AST: - Patient reports she has been taking a double dose of NyQuil every night to help her sleep. - She reports she cannot get her psychiatric doctor to prescribe her anything for sleep. - I feel her elevated enzymes is coming from the higher dose. -She was educated and told to stop taking NyQuil at night. - Labs on  08/12/2018 showed her AST 68

## 2018-08-19 NOTE — Patient Instructions (Signed)
[facility] at [facility] Discharge Instructions Follow up in 4 months with labs    Thank you for choosing [facility] at [facility] to provide your oncology and hematology care.  To afford each patient quality time with our provider, please arrive at least 15 minutes before your scheduled appointment time.   If you have a lab appointment with the Cancer Center please come in thru the  Main Entrance and check in at the main information desk  You need to re-schedule your appointment should you arrive 10 or more minutes late.  We strive to give you quality time with our providers, and arriving late affects you and other patients whose appointments are after yours.  Also, if you no show three or more times for appointments you may be dismissed from the clinic at the providers discretion.     Again, thank you for choosing [facility].  Our hope is that these requests will decrease the amount of time that you wait before being seen by our physicians.       _____________________________________________________________  Should you have questions after your visit to [facility], please contact our office at [phone] between the hours of 8:00 a.m. and 4:30 p.m.  Voicemails left after 4:00 p.m. will not be returned until the following business day.  For prescription refill requests, have your pharmacy contact our office and allow 72 hours.    Cancer Center Support Programs:   > Cancer Support Group  2nd Tuesday of the month 1pm-2pm, Journey Room    

## 2018-08-19 NOTE — Progress Notes (Signed)
[address] [place], [place] [address]   CLINIC:  Medical Oncology/Hematology  PCP:  Celene Squibb, MD [place] [address] [phone]   REASON FOR VISIT: Follow-up for ITP  CURRENT THERAPY: Observation   INTERVAL HISTORY:  Ms. Drennan 53 y.o. female returns for routine follow-up for ITP.  Reports she has been doing well since her last visit.  She denies any easy bruising or bleeding at this time. Denies any nausea, vomiting, or diarrhea. Denies any new pains. Had not noticed any recent bleeding such as epistaxis, hematuria or hematochezia. Denies recent chest pain on exertion, shortness of breath on minimal exertion, pre-syncopal episodes, or palpitations. Denies any numbness or tingling in hands or feet. Denies any recent fevers, infections, or recent hospitalizations. Patient reports appetite at 75% and energy level at 75%.  She is eating well maintaining her weight at this time.    REVIEW OF SYSTEMS:  Review of Systems  Psychiatric/Behavioral: Positive for sleep disturbance.  All other systems reviewed and are negative.    PAST MEDICAL/SURGICAL HISTORY:  Past Medical History:  Diagnosis Date  . Alcoholic hepatitis without ascites   . 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 COPD (chronic obstructive pulmonary disease) ([place])   . Emphysema lung ([place])   . Hepatitis C   . History of HPV infection   . History of pneumonia   . Smoker    Past Surgical History:  Procedure Laterality Date  . BIOPSY  05/02/2018   Procedure: BIOPSY;  Surgeon: Daneil Dolin, MD;  [facility];  Service: Endoscopy;;  gastric  . CESAREAN SECTION    . ESOPHAGOGASTRODUODENOSCOPY (EGD) WITH PROPOFOL N/A 05/02/2018   Procedure: ESOPHAGOGASTRODUODENOSCOPY (EGD) WITH PROPOFOL;  Surgeon: Daneil Dolin, MD;  Location: [facility];  Service: Endoscopy;  Laterality: N/A;  8:30am  . IR US GUIDE VASC ACCESS RIGHT   03/13/2018  . TOOTH EXTRACTION  01/31/2018   x 7     SOCIAL HISTORY:  Social History   Socioeconomic History  . Marital status: Single    Spouse name: Not on file  . Number of children: Not on file  . Years of education: Not on file  . Highest education level: Not on file  Occupational History  . Not on file  Social Needs  . Financial resource strain: Not on file  . Food insecurity    Worry: Not on file    Inability: Not on file  . Transportation needs    Medical: Not on file    Non-medical: Not on file  Tobacco Use  . Smoking status: Former Smoker    Packs/day: 0.50    Years: 38.00    Pack years: 19.00    Types: Cigarettes  . Smokeless tobacco: Never Used  . Tobacco comment: quit day was March 24th,2020 : high risk of starting back per pt  Substance and Sexual Activity  . Alcohol use: Not Currently    Comment: Last use of alcohol 03/2016  . Drug use: Not Currently  . Sexual activity: Not on file  Lifestyle  . Physical activity    Days per week: Not on file    Minutes per session: Not on file  . Stress: Not on file  Relationships  . Social Herbalist on phone: Not on file    Gets together: Not on file    Attends religious service: Not on file  Active member of club or organization: Not on file    Attends meetings of clubs or organizations: Not on file    Relationship status: Not on file  . Intimate partner violence    Fear of current or ex partner: Not on file    Emotionally abused: Not on file    Physically abused: Not on file    Forced sexual activity: Not on file  Other Topics Concern  . Not on file  Social History Narrative  . Not on file    FAMILY HISTORY:  Family History  Problem Relation Age of Onset  . Congenital heart disease Mother   . Bipolar disorder Mother   . Prostate cancer Brother   . Alzheimer's disease Paternal Grandmother   . Colon cancer Neg Hx     CURRENT MEDICATIONS:  Outpatient Encounter Medications as of  08/19/2018  Medication Sig  . divalproex (DEPAKOTE) 500 MG DR tablet Take 500 mg by mouth every evening.   Marland Kitchen DM-Doxylamine-Acetaminophen 30-12.05-998 MG/30ML LIQD Take by mouth at bedtime as needed. As needed for sleep  . pantoprazole (PROTONIX) 40 MG tablet TK 1 T PO QD  . risperiDONE (RISPERDAL) 1 MG tablet Take 1 mg by mouth at bedtime.  . varenicline (CHANTIX) 1 MG tablet Take 1 mg by mouth 2 (two) times daily.  . [DISCONTINUED] EPCLUSA 400-100 MG TABS Take 1 tablet by mouth daily.   No facility-administered encounter medications on file as of 08/19/2018.     ALLERGIES:  Allergies  Allergen Reactions  . Penicillins Anaphylaxis    Throat swelled Did it involve swelling of the face/tongue/throat, SOB, or low BP? Yes Did it involve sudden or severe rash/hives, skin peeling, or any reaction on the inside of your mouth or nose? No Did you need to seek medical attention at a hospital or doctor's office? No When did it last happen?childhood allergy If all above answers are "NO", may proceed with cephalosporin use.   Marland Kitchen Amoxicillin     hallucinations Did it involve swelling of the face/tongue/throat, SOB, or low BP? No Did it involve sudden or severe rash/hives, skin peeling, or any reaction on the inside of your mouth or nose? No Did you need to seek medical attention at a hospital or doctor's office? Yes When did it last happen?July 2019 If all above answers are "NO", may proceed with cephalosporin use.   . Fish Allergy Nausea Only     PHYSICAL EXAM:  ECOG Performance status: 1  Vitals:   08/19/18 1257  BP: 120/74  Pulse: 92  Resp: 18  Temp: 98.6 F (37 C)  SpO2: 98%   Filed Weights   08/19/18 1257  Weight: 166 lb 9.6 oz (75.6 kg)    Physical Exam Constitutional:      Appearance: Normal appearance. She is normal weight.  Cardiovascular:     Rate and Rhythm: Normal rate and regular rhythm.     Heart sounds: Normal heart sounds.  Pulmonary:     Effort:  Pulmonary effort is normal.     Breath sounds: Normal breath sounds.  Abdominal:     General: Bowel sounds are normal.     Palpations: Abdomen is soft.  Musculoskeletal: Normal range of motion.  Skin:    General: Skin is warm and dry.  Neurological:     Mental Status: She is alert and oriented to person, place, and time. Mental status is at baseline.  Psychiatric:        Mood and Affect: Mood normal.  Behavior: Behavior normal.        Thought Content: Thought content normal.        Judgment: Judgment normal.      LABORATORY DATA:  I have reviewed the labs as listed.  CBC    Component Value Date/Time   WBC 4.7 08/12/2018 1125   RBC 4.71 08/12/2018 1125   HGB 15.0 08/12/2018 1125   HCT 45.2 08/12/2018 1125   PLT 75 (L) 08/12/2018 1125   MCV 96.0 08/12/2018 1125   MCH 31.8 08/12/2018 1125   MCHC 33.2 08/12/2018 1125   RDW 12.9 08/12/2018 1125   LYMPHSABS 2.4 08/12/2018 1125   MONOABS 0.4 08/12/2018 1125   EOSABS 0.0 08/12/2018 1125   BASOSABS 0.0 08/12/2018 1125   CMP Latest Ref Rng & Units 08/12/2018 04/18/2018 01/17/2018  Glucose 70 - 99 mg/dL 88 117(H) 113(H)  BUN 6 - 20 mg/dL 8 7 <5(L)  Creatinine 0.44 - 1.00 mg/dL 0.52 0.47 0.46  Sodium 135 - 145 mmol/L 136 134(L) 139  Potassium 3.5 - 5.1 mmol/L 5.3(H) 4.0 3.5  Chloride 98 - 111 mmol/L 100 99 106  CO2 22 - 32 mmol/L 22 26 24   Calcium 8.9 - 10.3 mg/dL 9.6 9.3 9.0  Total Protein 6.5 - 8.1 g/dL 8.4(H) 7.9 7.5  Total Bilirubin 0.3 - 1.2 mg/dL 0.9 0.7 0.5  Alkaline Phos 38 - 126 U/L 79 57 59  AST 15 - 41 U/L 68(H) 31 141(H)  ALT 0 - 44 U/L 32 19 105(H)     I personally performed a face-to-face visit.  All questions were answered to patient's stated satisfaction. Encouraged patient to call with any new concerns or questions before his next visit to the cancer center and we can certain see him sooner, if needed.     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 Juel Burrow office from April 2019 showed platelet count of 49.  She had medical records from [place] where her platelet count was 127.  We have done all the work-up for thrombocytopenia.  Her platelet count dropped to 33.  Lupus anticoagulant and HIV tests were negative. -Ultrasound the abdomen on 05/31/2017 shows hepatic cirrhosis, normal size spleen. -We have given a trial of dexamethasone 40 mg for 4 days, on 06/14/2017.  Patient tolerated dexamethasone very well.  She felt better because of the energy level has improved.  Her platelet count improved to 81 from 33. -She was hospitalized in July 1751 with metabolic and colopathy with elevated ammonia levels.  This happened after she started on treatment with amoxicillin and codeine after her tooth infection.  Platelet count dropped to 28 while she was inpatient.  She received dexamethasone for 4 days with with a reluctant improvement in platelet count. -CT of the abdomen with and without contrast on 03/13/2018 showed hepatic cirrhosis without discrete hepatic mass.  1.1 x 0.9 x 0.7 cm right lower lobe pulmonary nodule.  Repeat chest CT was recommended in 3 months. -She denies any bleeding or easy bruising. -Labs on 08/12/2018 showed her platelet count at 75.  Hemoglobin 15.0 WBC 4.7 potassium 5.3.  Calcium 9.6.  AST is slightly increased at 68. -She will follow-up in 4 months with repeat blood work.  2.  Hep C infection: -She is seeing a hematologist in [place]. -She was started on Epclusa sometime in January 2020.  She is tolerating it well except occasional headaches.  3.  Elevated AST: - Patient  reports she has been taking a double dose of NyQuil every night to help her sleep. - She reports she cannot get her psychiatric doctor to prescribe her anything for sleep. - I feel her elevated enzymes is coming from the higher dose. -She was educated and told to stop taking NyQuil at night. - Labs on 08/12/2018  showed her AST 68      Orders placed this encounter:  Orders Placed This Encounter  Procedures  . CBC with Differential/Platelet  . Comprehensive metabolic panel      Francene Finders, FNP-C [place] [phone]

## 2018-08-29 ENCOUNTER — Telehealth: Payer: Self-pay | Admitting: Internal Medicine

## 2018-08-29 MED ORDER — PANTOPRAZOLE SODIUM 40 MG PO TBEC: DELAYED_RELEASE_TABLET | ORAL | 3 refills | Status: DC

## 2018-08-29 NOTE — Telephone Encounter (Signed)
Pt needs a refill on pantoprazole. She uses [facility] on [address].

## 2018-08-29 NOTE — Telephone Encounter (Signed)
Done

## 2018-08-29 NOTE — Telephone Encounter (Signed)
Noted. Tried calling pt to let her know and there is no VM.

## 2018-08-29 NOTE — Telephone Encounter (Signed)
Routing to [facility] Refill Box.

## 2018-09-03 ENCOUNTER — Other Ambulatory Visit: Payer: Self-pay | Admitting: Nurse Practitioner

## 2018-09-03 DIAGNOSIS — K7469 Other cirrhosis of liver: Secondary | ICD-10-CM

## 2018-09-12 ENCOUNTER — Ambulatory Visit
Admission: RE | Admit: 2018-09-12 | Discharge: 2018-09-12 | Disposition: A | Discharge status: Home / Self Care | Payer: Medicaid Other | Source: Ambulatory Visit | Attending: Nurse Practitioner | Admitting: Nurse Practitioner

## 2018-09-12 DIAGNOSIS — K7469 Other cirrhosis of liver: Secondary | ICD-10-CM

## 2018-09-18 ENCOUNTER — Other Ambulatory Visit (HOSPITAL_COMMUNITY): Payer: Self-pay | Admitting: Internal Medicine

## 2018-09-18 DIAGNOSIS — Z1231 Encounter for screening mammogram for malignant neoplasm of breast: Secondary | ICD-10-CM

## 2018-09-26 ENCOUNTER — Other Ambulatory Visit: Payer: Self-pay

## 2018-09-26 ENCOUNTER — Ambulatory Visit (HOSPITAL_COMMUNITY)
Admission: RE | Admit: 2018-09-26 | Discharge: 2018-09-26 | Disposition: A | Discharge status: Home / Self Care | Payer: Medicaid Other | Source: Ambulatory Visit | Attending: Internal Medicine | Admitting: Internal Medicine

## 2018-09-26 DIAGNOSIS — Z1231 Encounter for screening mammogram for malignant neoplasm of breast: Secondary | ICD-10-CM

## 2018-09-28 LAB — COLOGUARD: Cologuard: POSITIVE — AB

## 2018-11-14 ENCOUNTER — Encounter: Payer: Self-pay | Admitting: Internal Medicine

## 2018-12-05 ENCOUNTER — Ambulatory Visit: Payer: Medicaid Other | Admitting: Nurse Practitioner

## 2018-12-05 ENCOUNTER — Other Ambulatory Visit: Payer: Self-pay

## 2018-12-05 ENCOUNTER — Encounter: Payer: Self-pay | Admitting: Nurse Practitioner

## 2018-12-05 VITALS — BP 126/77 | HR 82 | Temp 96.6°F | Ht 61.0 in | Wt 176.4 lb

## 2018-12-05 DIAGNOSIS — K7460 Unspecified cirrhosis of liver: Secondary | ICD-10-CM

## 2018-12-05 DIAGNOSIS — R195 Other fecal abnormalities: Secondary | ICD-10-CM | POA: Insufficient documentation

## 2018-12-05 MED ORDER — CLENPIQ 10-3.5-12 MG-GM -GM/160ML PO SOLN: 1.0000 | Freq: Once | ORAL | 0 refills | Status: AC

## 2018-12-05 NOTE — Patient Instructions (Signed)
Your health issues we discussed today were:   Cirrhosis: 1. I am glad you are able to have your hepatitis C treated 2. As we have always discussed, completely abstain from any alcohol 3. We will follow you up in 3 months to check your liver imaging and labs 4. You will likely need an office visit every 6 months to monitor your liver function  Need for colonoscopy: 1. You are currently due for colonoscopy 2. We will proceed with scheduling your colonoscopy for you.  Further recommendations will be made after colonoscopy 3. Call us if you have any significant bleeding in your stools or any other concerning symptoms  Overall I recommend:  1. Continue your other current medications 2. Return for follow-up in 3 months 3. Call us if you have any questions or concerns.   Because of recent events of COVID-19 ("Coronavirus"), follow CDC recommendations:  1. Wash your hand frequently 2. Avoid touching your face 3. Stay away from people who are sick 4. If you have symptoms such as fever, cough, shortness of breath then call your healthcare provider for further guidance 5. If you are sick, STAY AT HOME unless otherwise directed by your healthcare provider. 6. Follow directions from state and national officials regarding staying safe   At [facility] we value your feedback. You may receive a survey about your visit today. Please share your experience as we strive to create trusting relationships with our patients to provide genuine, compassionate, quality care.  We appreciate your understanding and patience as we review any laboratory studies, imaging, and other diagnostic tests that are ordered as we care for you. Our office policy is 5 business days for review of these results, and any emergent or urgent results are addressed in a timely manner for your best interest. If you do not hear from our office in 1 week, please contact us.   We also encourage the use of MyChart, which  contains your medical information for your review as well. If you are not enrolled in this feature, an access code is on this after visit summary for your convenience. Thank you for allowing [place] to be involved in your care.  It was great to see you today!  I hope you have a Happy Thanksgiving!!

## 2018-12-05 NOTE — Assessment & Plan Note (Signed)
History of cirrhosis with hepatitis C treatment in [place], history of alcohol abuse.  She has not used alcohol since 2018.  Her hepatitis C was beating [place] successfully with Epclusa.  She needs ongoing liver care due to cirrhosis.  She is next due for her imaging and labs in February and we will have her follow-up at that time.  Generally asymptomatic from a GI and hepatic standpoint.

## 2018-12-05 NOTE — Progress Notes (Signed)
Referring Provider: Celene Squibb, MD Primary Care Physician:  Celene Squibb, MD Primary GI:  Dr. Gala Romney  Chief Complaint  Patient presents with  . +cologuard    never had tcs    HPI:   Rachel Gross is a 53 y.o. female who presents on referral from primary care for positive Cologuard.  The patient was last seen in our office 03/06/2018 for thrombocytopenia, cirrhosis.  Previously referred to the liver clinic in [facility] for treatment of hepatitis C in the setting of complicated cirrhosis with previous decompensation.  It appears she was treated with Epclusa.  Previous meld score 8, child Pugh class A.  Recommended EGD by local GI.  She is compliant with her medication instructions, plans to have dentures fitted that week.  No overt GI complaints.  Recommended EGD for variceal screening, follow-up in 1 year, continue to see the liver clinic in [place] for cirrhosis.  EGD was completed 05/02/2018 which found mild erosive reflux esophagitis, mild Schatzki's ring, portal hypertensive gastropathy, erosive gastropathy status post biopsy, normal duodenum.  No esophageal varices noted.  Surgical pathology found the biopsies to be antral mucosa with hyperemia and slight chronic inflammation, negative for H. pylori.  Recommended continue current medications, start Protonix 40 mg daily, repeat EGD in 2 years (2022) for screening purposes.  I reviewed primary care office visit note which is Medicare annual wellness visit on 09/11/2018.  At that time it was noted the patient was due for a colonoscopy.  Cologuard test was ordered which came back positive on 09/22/2018.  She was referred to GI.  Today she states she's doing ok overall. Has never had a colonoscopy before. She states she doesn't see [facility] anymore due to Hepatitis C treated and SVR. Was told to check for liver cancer every 6 months. She will be next due in 03/2019. Denies abdominal pain, N/V, hematochezia, melena, fever, chills, unintentional  weight loss. Denies URI or flu-like symptoms. Denies loss of sense of taste or smell. Denies chest pain, dyspnea, dizziness, lightheadedness, syncope, near syncope. Denies any other upper or lower GI symptoms.  Past Medical History:  Diagnosis Date  . Alcoholic hepatitis without ascites   . 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 COPD (chronic obstructive pulmonary disease) ([place])   . Emphysema lung ([place])   . Hepatitis C    s/p treatment with Epclusa  . History of HPV infection   . History of pneumonia   . Smoker     Past Surgical History:  Procedure Laterality Date  . BIOPSY  05/02/2018   Procedure: BIOPSY;  Surgeon: Daneil Dolin, MD;  [facility];  Service: Endoscopy;;  gastric  . CESAREAN SECTION    . ESOPHAGOGASTRODUODENOSCOPY (EGD) WITH PROPOFOL N/A 05/02/2018   Procedure: ESOPHAGOGASTRODUODENOSCOPY (EGD) WITH PROPOFOL;  Surgeon: Daneil Dolin, MD;  [facility];  Service: Endoscopy;  Laterality: N/A;  8:30am  . IR US GUIDE VASC ACCESS RIGHT  03/13/2018  . TOOTH EXTRACTION  01/31/2018   x 7    Current Outpatient Medications  Medication Sig Dispense Refill  . diphenhydrAMINE (BENADRYL) 25 MG tablet Take 25 mg by mouth at bedtime as needed.    . divalproex (DEPAKOTE) 500 MG DR tablet Take 500 mg by mouth every evening.     . pantoprazole (PROTONIX) 40 MG tablet Take one tablet before breakfast daily. 90 tablet 3  . risperiDONE (RISPERDAL) 1 MG tablet Take 1 mg by mouth at  bedtime.     No current facility-administered medications for this visit.     Allergies as of 12/05/2018 - Review Complete 12/05/2018  Allergen Reaction Noted  . Penicillins Anaphylaxis 09/10/2017  . Amoxicillin  09/10/2017  . Fish allergy Nausea Only 05/24/2017    Family History  Problem Relation Age of Onset  . Congenital heart disease Mother   . Bipolar disorder Mother   . Prostate cancer Brother   . Alzheimer's disease Paternal Grandmother   .  Colon cancer Neg Hx     Social History   Socioeconomic History  . Marital status: Single    Spouse name: Not on file  . Number of children: Not on file  . Years of education: Not on file  . Highest education level: Not on file  Occupational History  . Not on file  Social Needs  . Financial resource strain: Not on file  . Food insecurity    Worry: Not on file    Inability: Not on file  . Transportation needs    Medical: Not on file    Non-medical: Not on file  Tobacco Use  . Smoking status: Current Every Day Smoker    Packs/day: 0.50    Years: 38.00    Pack years: 19.00    Types: Cigarettes  . Smokeless tobacco: Never Used  Substance and Sexual Activity  . Alcohol use: Not Currently    Comment: Last use of alcohol 03/2016  . Drug use: Not Currently  . Sexual activity: Not on file  Lifestyle  . Physical activity    Days per week: Not on file    Minutes per session: Not on file  . Stress: Not on file  Relationships  . Social Herbalist on phone: Not on file    Gets together: Not on file    Attends religious service: Not on file    Active member of club or organization: Not on file    Attends meetings of clubs or organizations: Not on file    Relationship status: Not on file  Other Topics Concern  . Not on file  Social History Narrative  . Not on file    Review of Systems: General: Negative for anorexia, weight loss, fever, chills, fatigue, weakness. ENT: Negative for hoarseness, difficulty swallowing. CV: Negative for chest pain, angina, palpitations, peripheral edema.  Respiratory: Negative for dyspnea at rest, cough, sputum, wheezing.  GI: See history of present illness. Endo: Negative for unusual weight change.  Heme: Negative for bruising or bleeding. Allergy: Negative for rash or hives.   Physical Exam: BP 126/77   Pulse 82   Temp (!) 96.6 F (35.9 C) (Temporal)   Ht 5\' 1"  (1.549 m)   Wt 176 lb 6.4 oz (80 kg)   BMI 33.33 kg/m   General:   Alert and oriented. Pleasant and cooperative. Well-nourished and well-developed.  Eyes:  Without icterus, sclera clear and conjunctiva pink.  Ears:  Normal auditory acuity. Cardiovascular:  S1, S2 present without murmurs appreciated. Extremities without clubbing or edema. Respiratory:  Clear to auscultation bilaterally. No wheezes, rales, or rhonchi. No distress.  Gastrointestinal:  +BS, soft, non-tender and non-distended. No HSM noted. No guarding or rebound. No masses appreciated.  Rectal:  Deferred  Musculoskalatal:  Symmetrical without gross deformities. Skin:  Intact without significant lesions or rashes. Neurologic:  Alert and oriented x4;  grossly normal neurologically. Psych:  Alert and cooperative. Normal mood and affect. Heme/Lymph/Immune: No excessive bruising noted.  12/05/2018 11:14 AM   Disclaimer: This note was dictated with voice recognition software. Similar sounding words can inadvertently be transcribed and may not be corrected upon review.

## 2018-12-05 NOTE — Assessment & Plan Note (Signed)
The patient is currently due for colonoscopy.  She did have a positive Cologuard test at her primary care visit.  She presents to schedule a colonoscopy today.  Due to history of alcohol abuse we will need to proceed with monitored anesthesia care.  Further recommendations to follow.  Generally asymptomatic from a GI standpoint.  Proceed with TCS on propofol/MAC with Dr. Gala Romney in near future: the risks, benefits, and alternatives have been discussed with the patient in detail. The patient states understanding and desires to proceed.  The patient.  History of alcohol abuse but none since 2018.  No other anticoagulants, anxiolytics, chronic pain medications, antidepressants, antidiabetics, or iron supplements.  Denies drug use.  We will plan for the procedure on propofol/MAC to promote adequate sedation, as was done for her last endoscopic procedure.

## 2018-12-06 NOTE — Progress Notes (Signed)
cc'ed to pcp °

## 2018-12-23 ENCOUNTER — Other Ambulatory Visit: Payer: Self-pay

## 2018-12-23 ENCOUNTER — Inpatient Hospital Stay (HOSPITAL_COMMUNITY): Payer: Medicaid Other | Attending: Hematology

## 2018-12-23 DIAGNOSIS — D693 Immune thrombocytopenic purpura: Secondary | ICD-10-CM | POA: Diagnosis not present

## 2018-12-23 LAB — COMPREHENSIVE METABOLIC PANEL
ALT: 24 U/L (ref 0–44)
AST: 33 U/L (ref 15–41)
Albumin: 4.2 g/dL (ref 3.5–5.0)
Alkaline Phosphatase: 72 U/L (ref 38–126)
Anion gap: 13 (ref 5–15)
BUN: 8 mg/dL (ref 6–20)
CO2: 24 mmol/L (ref 22–32)
Calcium: 9.5 mg/dL (ref 8.9–10.3)
Chloride: 101 mmol/L (ref 98–111)
Creatinine, Ser: 0.56 mg/dL (ref 0.44–1.00)
GFR calc Af Amer: >60 mL/min (ref >60)
GFR calc non Af Amer: >60 mL/min (ref >60)
Glucose, Bld: 122 mg/dL — ABNORMAL HIGH (ref 70–99)
Potassium: 4.1 mmol/L (ref 3.5–5.1)
Sodium: 138 mmol/L (ref 135–145)
Total Bilirubin: 0.7 mg/dL (ref 0.3–1.2)
Total Protein: 8.1 g/dL (ref 6.5–8.1)

## 2018-12-23 LAB — CBC WITH DIFFERENTIAL/PLATELET
Abs Immature Granulocytes: 0.02 10*3/uL (ref 0.00–0.07)
Basophils Absolute: 0 10*3/uL (ref 0.0–0.1)
Basophils Relative: 0 %
Eosinophils Absolute: 0 10*3/uL (ref 0.0–0.5)
Eosinophils Relative: 0 %
HCT: 46.7 % — ABNORMAL HIGH (ref 36.0–46.0)
Hemoglobin: 15.4 g/dL — ABNORMAL HIGH (ref 12.0–15.0)
Immature Granulocytes: 0 %
Lymphocytes Relative: 39 %
Lymphs Abs: 2.7 10*3/uL (ref 0.7–4.0)
MCH: 31.8 pg (ref 26.0–34.0)
MCHC: 33 g/dL (ref 30.0–36.0)
MCV: 96.3 fL (ref 80.0–100.0)
Monocytes Absolute: 0.5 10*3/uL (ref 0.1–1.0)
Monocytes Relative: 8 %
Neutro Abs: 3.7 10*3/uL (ref 1.7–7.7)
Neutrophils Relative %: 53 %
Platelets: 101 10*3/uL — ABNORMAL LOW (ref 150–400)
RBC: 4.85 MIL/uL (ref 3.87–5.11)
RDW: 12.4 % (ref 11.5–15.5)
WBC: 6.9 10*3/uL (ref 4.0–10.5)
nRBC: 0 % (ref 0.0–0.2)

## 2018-12-30 ENCOUNTER — Ambulatory Visit (HOSPITAL_COMMUNITY): Payer: Medicaid Other | Admitting: Nurse Practitioner

## 2018-12-31 ENCOUNTER — Inpatient Hospital Stay (HOSPITAL_COMMUNITY): Payer: Medicaid Other | Attending: Nurse Practitioner | Admitting: Nurse Practitioner

## 2018-12-31 ENCOUNTER — Other Ambulatory Visit: Payer: Self-pay

## 2018-12-31 DIAGNOSIS — D693 Immune thrombocytopenic purpura: Secondary | ICD-10-CM

## 2018-12-31 DIAGNOSIS — R911 Solitary pulmonary nodule: Secondary | ICD-10-CM

## 2018-12-31 NOTE — Assessment & Plan Note (Signed)
1.  Immune thrombocytopenia: -Recent blood work at Dr. Juel Burrow office from April 2019 showed platelet count of 49.  She had medical records from [place] where her platelet count was 127.  We have done all the work-up for thrombocytopenia.  Her platelet count dropped to 33.  Lupus anticoagulant and HIV tests were negative. -Ultrasound the abdomen on 05/31/2017 shows hepatic cirrhosis, normal size spleen. -We have given a trial of dexamethasone 40 mg for 4 days, on 06/14/2017.  Patient tolerated dexamethasone very well.  She felt better because of the energy level has improved.  Her platelet count improved to 81 from 33. -She was hospitalized in July 0383 with metabolic and colopathy with elevated ammonia levels.  This happened after she started on treatment with amoxicillin and codeine after her tooth infection.  Platelet count dropped to 28 while she was inpatient.  She received dexamethasone for 4 days with with a reluctant improvement in platelet count. -CT of the abdomen with and without contrast on 03/13/2018 showed hepatic cirrhosis without discrete hepatic mass.  1.1 x 0.9 x 0.7 cm right lower lobe pulmonary nodule.  Repeat chest CT was recommended in 3 months. -She denies any bleeding or easy bruising. -Labs on 12/23/2018 showed her platelet count at 101.  Hemoglobin 15.4, WBC 6.9, potassium 4.1.  Calcium 9.5.  LFTs WNL -Her follow-up CT of her chest to look at her right lobe pulmonary nodule was canceled due to unable to get an IV.  We will reschedule that prior to her next visit. -She will follow-up in 4 months with repeat blood work and CT of chest.  2.  Hep C infection: -She is seeing a hematologist in [place]. -She was started on [place] sometime in January 2020.  She is tolerating it well except occasional headaches. -Patient reports she is hepatitis C free and she has completed all necessary treatment.  3.  Elevated AST: - Patient reports she has been taking a double dose of NyQuil every  night to help her sleep. - She reports she cannot get her psychiatric doctor to prescribe her anything for sleep. - I feel her elevated enzymes is coming from the higher dose. -She was educated and told to stop taking NyQuil at night. -Patient now takes Benadryl to sleep and her LFTs have returned to normal.

## 2018-12-31 NOTE — Progress Notes (Signed)
[address] [place], [place] [address]   CLINIC:  Medical Oncology/Hematology  PCP:  Celene Squibb, MD [place] [address] [phone]   REASON FOR VISIT: Follow-up for immune thrombocytopenia  CURRENT THERAPY: Observation   INTERVAL HISTORY:  Rachel Gross 53 y.o. female was called for a telephone visit to follow-up for her immune thrombocytopenia.  Patient reports she has been doing well since her last visit.  She denies any easy bruising or bleeding.  She denies any bright red bleeding per rectum or melena.  She denies any rash. Denies any nausea, vomiting, or diarrhea. Denies any new pains. Had not noticed any recent bleeding such as epistaxis, hematuria or hematochezia. Denies recent chest pain on exertion, shortness of breath on minimal exertion, pre-syncopal episodes, or palpitations. Denies any numbness or tingling in hands or feet. Denies any recent fevers, infections, or recent hospitalizations. Patient reports appetite at 75% and energy level at 75%.  She is eating well maintain her weight at this time.     REVIEW OF SYSTEMS:  Review of Systems  All other systems reviewed and are negative.    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    COPD (chronic obstructive pulmonary disease) (HCC)    Emphysema lung (HCC)    Hepatitis C    s/p treatment with Epclusa   History of HPV infection    History of pneumonia    Smoker    Past Surgical History:  Procedure Laterality Date   BIOPSY  05/02/2018   Procedure: BIOPSY;  Surgeon: Daneil Dolin, MD;  Location: [facility];  Service: Endoscopy;;  gastric   CESAREAN SECTION     ESOPHAGOGASTRODUODENOSCOPY (EGD) WITH PROPOFOL N/A 05/02/2018   Procedure: ESOPHAGOGASTRODUODENOSCOPY (EGD) WITH PROPOFOL;  Surgeon: Daneil Dolin, MD;   Location: [facility];  Service: Endoscopy;  Laterality: N/A;  8:30am   IR US GUIDE VASC ACCESS RIGHT  03/13/2018   TOOTH EXTRACTION  01/31/2018   x 7     SOCIAL HISTORY:  Social History   Socioeconomic History   Marital status: Single    Spouse name: Not on file   Number of children: Not on file   Years of education: Not on file   Highest education level: Not on file  Occupational History   Not on file  Social Needs   Financial resource strain: Not on file   Food insecurity    Worry: Not on file    Inability: Not on file   Transportation needs    Medical: Not on file    Non-medical: Not on file  Tobacco Use   Smoking status: Current Every Day Smoker    Packs/day: 0.50    Years: 38.00    Pack years: 19.00    Types: Cigarettes   Smokeless tobacco: Never Used  Substance and Sexual Activity   Alcohol use: Not Currently    Comment: Last use of alcohol 03/2016   Drug use: Not Currently   Sexual activity: Not on file  Lifestyle   Physical activity    Days per week: Not on file    Minutes per session: Not on file   Stress: Not on file  Relationships   Social connections    Talks on phone: Not on file    Gets together: Not on file  Attends religious service: Not on file    Active member of club or organization: Not on file    Attends meetings of clubs or organizations: Not on file    Relationship status: Not on file   Intimate partner violence    Fear of current or ex partner: Not on file    Emotionally abused: Not on file    Physically abused: Not on file    Forced sexual activity: Not on file  Other Topics Concern   Not on file  Social History Narrative   Not on file    FAMILY HISTORY:  Family History  Problem Relation Age of Onset   Congenital heart disease Mother    Bipolar disorder Mother    Prostate cancer Brother    Alzheimer's disease Paternal Grandmother    Colon cancer Neg Hx     CURRENT MEDICATIONS:  Outpatient  Encounter Medications as of 12/31/2018  Medication Sig   diphenhydrAMINE (BENADRYL) 25 MG tablet Take 25 mg by mouth at bedtime as needed.   divalproex (DEPAKOTE) 500 MG DR tablet Take 500 mg by mouth every evening.    pantoprazole (PROTONIX) 40 MG tablet Take one tablet before breakfast daily.   risperiDONE (RISPERDAL) 1 MG tablet Take 1 mg by mouth at bedtime.   No facility-administered encounter medications on file as of 12/31/2018.     ALLERGIES:  Allergies  Allergen Reactions   Penicillins Anaphylaxis    Throat swelled Did it involve swelling of the face/tongue/throat, SOB, or low BP? Yes Did it involve sudden or severe rash/hives, skin peeling, or any reaction on the inside of your mouth or nose? No Did you need to seek medical attention at a hospital or doctor's office? No When did it last happen?childhood allergy If all above answers are NO, may proceed with cephalosporin use.    Amoxicillin     hallucinations Did it involve swelling of the face/tongue/throat, SOB, or low BP? No Did it involve sudden or severe rash/hives, skin peeling, or any reaction on the inside of your mouth or nose? No Did you need to seek medical attention at a hospital or doctor's office? Yes When did it last happen?July 2019 If all above answers are NO, may proceed with cephalosporin use.    Fish Allergy Nausea Only     Vital signs: -Deferred due to telephone visit  Physical Exam -Deferred due to telephone visit -Patient was alert and oriented on the phone and in no acute distress.  LABORATORY DATA:  I have reviewed the labs as listed.  CBC    Component Value Date/Time   WBC 6.9 12/23/2018 1054   RBC 4.85 12/23/2018 1054   HGB 15.4 (H) 12/23/2018 1054   HCT 46.7 (H) 12/23/2018 1054   PLT 101 (L) 12/23/2018 1054   MCV 96.3 12/23/2018 1054   MCH 31.8 12/23/2018 1054   MCHC 33.0 12/23/2018 1054   RDW 12.4 12/23/2018 1054   LYMPHSABS 2.7 12/23/2018 1054   MONOABS  0.5 12/23/2018 1054   EOSABS 0.0 12/23/2018 1054   BASOSABS 0.0 12/23/2018 1054   CMP Latest Ref Rng & Units 12/23/2018 08/12/2018 04/18/2018  Glucose 70 - 99 mg/dL 122(H) 88 117(H)  BUN 6 - 20 mg/dL 8 8 7   Creatinine 0.44 - 1.00 mg/dL 0.56 0.52 0.47  Sodium 135 - 145 mmol/L 138 136 134(L)  Potassium 3.5 - 5.1 mmol/L 4.1 5.3(H) 4.0  Chloride 98 - 111 mmol/L 101 100 99  CO2 22 - 32 mmol/L 24 22  26  Calcium 8.9 - 10.3 mg/dL 9.5 9.6 9.3  Total Protein 6.5 - 8.1 g/dL 8.1 8.4(H) 7.9  Total Bilirubin 0.3 - 1.2 mg/dL 0.7 0.9 0.7  Alkaline Phos 38 - 126 U/L 72 79 57  AST 15 - 41 U/L 33 68(H) 31  ALT 0 - 44 U/L 24 32 19    All questions were answered to patient's stated satisfaction. Encouraged patient to call with any new concerns or questions before his next visit to the cancer center and we can certain see him sooner, if needed.      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 Juel Burrow office from April 2019 showed platelet count of 49.  She had medical records from [place] where her platelet count was 127.  We have done all the work-up for thrombocytopenia.  Her platelet count dropped to 33.  Lupus anticoagulant and HIV tests were negative. -Ultrasound the abdomen on 05/31/2017 shows hepatic cirrhosis, normal size spleen. -We have given a trial of dexamethasone 40 mg for 4 days, on 06/14/2017.  Patient tolerated dexamethasone very well.  She felt better because of the energy level has improved.  Her platelet count improved to 81 from 33. -She was hospitalized in July 3536 with metabolic and colopathy with elevated ammonia levels.  This happened after she started on treatment with amoxicillin and codeine after her tooth infection.  Platelet count dropped to 28 while she was inpatient.  She received dexamethasone for 4 days with with a reluctant improvement in platelet count. -CT of the abdomen with and without contrast  on 03/13/2018 showed hepatic cirrhosis without discrete hepatic mass.  1.1 x 0.9 x 0.7 cm right lower lobe pulmonary nodule.  Repeat chest CT was recommended in 3 months. -She denies any bleeding or easy bruising. -Labs on 12/23/2018 showed her platelet count at 101.  Hemoglobin 15.4, WBC 6.9, potassium 4.1.  Calcium 9.5.  LFTs WNL -Her follow-up CT of her chest to look at her right lobe pulmonary nodule was canceled due to unable to get an IV.  We will reschedule that prior to her next visit. -She will follow-up in 4 months with repeat blood work and CT of chest.  2.  Hep C infection: -She is seeing a hematologist in [place]. -She was started on [place] sometime in January 2020.  She is tolerating it well except occasional headaches. -Patient reports she is hepatitis C free and she has completed all necessary treatment.  3.  Elevated AST: - Patient reports she has been taking a double dose of NyQuil every night to help her sleep. - She reports she cannot get her psychiatric doctor to prescribe her anything for sleep. - I feel her elevated enzymes is coming from the higher dose. -She was educated and told to stop taking NyQuil at night. -Patient now takes Benadryl to sleep and her LFTs have returned to normal.      Orders placed this encounter:  Orders Placed This Encounter  Procedures   CT CHEST W CONTRAST   Lactate dehydrogenase   CBC with Differential/Platelet   Comprehensive metabolic panel   Vitamin R44   Vitamin D 25 hydroxy      Rachel Finders, Rachel [address] [place] [phone]

## 2019-02-27 ENCOUNTER — Other Ambulatory Visit (HOSPITAL_COMMUNITY)
Admission: RE | Admit: 2019-02-27 | Discharge: 2019-02-27 | Disposition: A | Discharge status: Home / Self Care | Payer: Medicaid Other | Source: Ambulatory Visit | Attending: Internal Medicine | Admitting: Internal Medicine

## 2019-02-27 ENCOUNTER — Other Ambulatory Visit: Payer: Self-pay

## 2019-02-27 ENCOUNTER — Encounter (HOSPITAL_COMMUNITY)
Admission: RE | Admit: 2019-02-27 | Discharge: 2019-02-27 | Disposition: A | Discharge status: Home / Self Care | Payer: Medicaid Other | Source: Ambulatory Visit | Attending: Internal Medicine | Admitting: Internal Medicine

## 2019-02-27 DIAGNOSIS — Z20822 Contact with and (suspected) exposure to covid-19: Secondary | ICD-10-CM | POA: Diagnosis not present

## 2019-02-27 DIAGNOSIS — Z01812 Encounter for preprocedural laboratory examination: Secondary | ICD-10-CM | POA: Insufficient documentation

## 2019-02-27 LAB — SARS CORONAVIRUS 2 (TAT 6-24 HRS): SARS Coronavirus 2: NEGATIVE

## 2019-03-03 ENCOUNTER — Other Ambulatory Visit: Payer: Self-pay

## 2019-03-03 ENCOUNTER — Ambulatory Visit (HOSPITAL_COMMUNITY): Payer: Medicaid Other | Admitting: Anesthesiology

## 2019-03-03 ENCOUNTER — Encounter (HOSPITAL_COMMUNITY): Payer: Self-pay | Admitting: Internal Medicine

## 2019-03-03 ENCOUNTER — Telehealth: Payer: Self-pay

## 2019-03-03 ENCOUNTER — Encounter (HOSPITAL_COMMUNITY)
Admission: RE | Disposition: A | Discharge status: Home / Self Care | Payer: Self-pay | Source: Home / Self Care | Attending: Internal Medicine

## 2019-03-03 ENCOUNTER — Ambulatory Visit (HOSPITAL_COMMUNITY)
Admission: RE | Admit: 2019-03-03 | Discharge: 2019-03-03 | Disposition: A | Discharge status: Home / Self Care | Payer: Medicaid Other | Attending: Internal Medicine | Admitting: Internal Medicine

## 2019-03-03 DIAGNOSIS — Z7951 Long term (current) use of inhaled steroids: Secondary | ICD-10-CM | POA: Diagnosis not present

## 2019-03-03 DIAGNOSIS — F17210 Nicotine dependence, cigarettes, uncomplicated: Secondary | ICD-10-CM | POA: Diagnosis not present

## 2019-03-03 DIAGNOSIS — D128 Benign neoplasm of rectum: Secondary | ICD-10-CM | POA: Diagnosis not present

## 2019-03-03 DIAGNOSIS — R195 Other fecal abnormalities: Secondary | ICD-10-CM | POA: Insufficient documentation

## 2019-03-03 DIAGNOSIS — Z79899 Other long term (current) drug therapy: Secondary | ICD-10-CM | POA: Diagnosis not present

## 2019-03-03 DIAGNOSIS — K5730 Diverticulosis of large intestine without perforation or abscess without bleeding: Secondary | ICD-10-CM | POA: Diagnosis not present

## 2019-03-03 DIAGNOSIS — D123 Benign neoplasm of transverse colon: Secondary | ICD-10-CM | POA: Insufficient documentation

## 2019-03-03 DIAGNOSIS — Z880 Allergy status to penicillin: Secondary | ICD-10-CM | POA: Diagnosis not present

## 2019-03-03 DIAGNOSIS — J439 Emphysema, unspecified: Secondary | ICD-10-CM | POA: Diagnosis not present

## 2019-03-03 DIAGNOSIS — K635 Polyp of colon: Secondary | ICD-10-CM

## 2019-03-03 DIAGNOSIS — D120 Benign neoplasm of cecum: Secondary | ICD-10-CM | POA: Diagnosis not present

## 2019-03-03 DIAGNOSIS — F319 Bipolar disorder, unspecified: Secondary | ICD-10-CM | POA: Diagnosis not present

## 2019-03-03 DIAGNOSIS — K621 Rectal polyp: Secondary | ICD-10-CM

## 2019-03-03 HISTORY — PX: HEMOSTASIS CLIP PLACEMENT: SHX6857

## 2019-03-03 HISTORY — PX: POLYPECTOMY: SHX5525

## 2019-03-03 HISTORY — PX: COLONOSCOPY WITH PROPOFOL: SHX5780

## 2019-03-03 SURGERY — COLONOSCOPY WITH PROPOFOL
Anesthesia: General

## 2019-03-03 MED ORDER — PROMETHAZINE HCL 25 MG/ML IJ SOLN: 6.2500 mg | INTRAMUSCULAR | Status: DC | PRN

## 2019-03-03 MED ORDER — KETAMINE HCL 50 MG/5ML IJ SOSY
PREFILLED_SYRINGE | INTRAMUSCULAR | Status: AC
  Filled 2019-03-03: qty 5

## 2019-03-03 MED ORDER — HYDROCODONE-ACETAMINOPHEN 7.5-325 MG PO TABS: 1.0000 | ORAL_TABLET | Freq: Once | ORAL | Status: DC | PRN

## 2019-03-03 MED ORDER — LIDOCAINE 2% (20 MG/ML) 5 ML SYRINGE
INTRAMUSCULAR | Status: AC
  Filled 2019-03-03: qty 10

## 2019-03-03 MED ORDER — PROPOFOL 10 MG/ML IV BOLUS
INTRAVENOUS | Status: AC
  Filled 2019-03-03: qty 20

## 2019-03-03 MED ORDER — LIDOCAINE HCL (CARDIAC) PF 100 MG/5ML IV SOSY
PREFILLED_SYRINGE | INTRAVENOUS | Status: DC | PRN
  Administered 2019-03-03: 50 mg via INTRAVENOUS

## 2019-03-03 MED ORDER — KETAMINE HCL 10 MG/ML IJ SOLN
INTRAMUSCULAR | Status: DC | PRN
  Administered 2019-03-03: 30 mg via INTRAVENOUS

## 2019-03-03 MED ORDER — GLYCOPYRROLATE 0.2 MG/ML IJ SOLN
INTRAMUSCULAR | Status: DC | PRN
  Administered 2019-03-03: .1 mg via INTRAVENOUS

## 2019-03-03 MED ORDER — CHLORHEXIDINE GLUCONATE CLOTH 2 % EX PADS: 6.0000 | MEDICATED_PAD | Freq: Once | CUTANEOUS | Status: DC

## 2019-03-03 MED ORDER — MIDAZOLAM HCL 2 MG/2ML IJ SOLN: 0.5000 mg | Freq: Once | INTRAMUSCULAR | Status: DC | PRN

## 2019-03-03 MED ORDER — PROPOFOL 500 MG/50ML IV EMUL
INTRAVENOUS | Status: DC | PRN
  Administered 2019-03-03: 125 ug/kg/min via INTRAVENOUS

## 2019-03-03 MED ORDER — HYDROMORPHONE HCL 1 MG/ML IJ SOLN: 0.2500 mg | INTRAMUSCULAR | Status: DC | PRN

## 2019-03-03 MED ORDER — LACTATED RINGERS IV SOLN: INTRAVENOUS | Status: DC | PRN

## 2019-03-03 MED ORDER — LACTATED RINGERS IV SOLN: INTRAVENOUS | Status: DC

## 2019-03-03 MED ORDER — PROPOFOL 10 MG/ML IV BOLUS
INTRAVENOUS | Status: DC | PRN
  Administered 2019-03-03: 30 mg via INTRAVENOUS

## 2019-03-03 NOTE — Transfer of Care (Signed)
Immediate Anesthesia Transfer of Care Note  Patient: Rachel Gross  Procedure(s) Performed: COLONOSCOPY WITH PROPOFOL (N/A ) POLYPECTOMY HEMOSTASIS CLIP PLACEMENT  Patient Location: PACU  Anesthesia Type:General  Level of Consciousness: awake, alert  and oriented  Airway & Oxygen Therapy: Patient Spontanous Breathing  Post-op Assessment: Report given to RN and Post -op Vital signs reviewed and stable  Post vital signs: Reviewed and stable  Last Vitals:  Vitals Value Taken Time  BP    Temp    Pulse 72 03/03/19 0907  Resp 19 03/03/19 0907  SpO2 100 % 03/03/19 0907  Vitals shown include unvalidated device data.  Last Pain:  Vitals:   03/03/19 0829  TempSrc:   PainSc: 0-No pain      Patients Stated Pain Goal: 8 (90/30/14 9969)  Complications: No apparent anesthesia complications

## 2019-03-03 NOTE — Telephone Encounter (Signed)
Called patient and rescheduled appointment for 3 months

## 2019-03-03 NOTE — Anesthesia Postprocedure Evaluation (Signed)
Anesthesia Post Note  Patient: Rachel Gross  Procedure(s) Performed: COLONOSCOPY WITH PROPOFOL (N/A ) POLYPECTOMY HEMOSTASIS CLIP PLACEMENT  Patient location during evaluation: PACU Anesthesia Type: General Level of consciousness: awake and alert Pain management: pain level controlled Vital Signs Assessment: post-procedure vital signs reviewed and stable Respiratory status: spontaneous breathing Cardiovascular status: stable Postop Assessment: no apparent nausea or vomiting Anesthetic complications: no     Last Vitals:  Vitals:   03/03/19 0806  BP: (!) 131/92  Pulse: 82  Resp: 16  Temp: 36.9 C  SpO2: 98%    Last Pain:  Vitals:   03/03/19 0829  TempSrc:   PainSc: 0-No pain                 Geoffery Aultman Hristova

## 2019-03-03 NOTE — Telephone Encounter (Signed)
Thanks

## 2019-03-03 NOTE — Anesthesia Preprocedure Evaluation (Signed)
Anesthesia Evaluation  Patient identified by MRN, date of birth, ID band Patient awake    Reviewed: Allergy & Precautions, NPO status , Patient's Chart, lab work & pertinent test results  Airway Mallampati: II  TM Distance: >3 FB Neck ROM: Full    Dental no notable dental hx. (+) Poor Dentition, Missing   Pulmonary COPD, Current SmokerPatient did not abstain from smoking.,    Pulmonary exam normal breath sounds clear to auscultation       Cardiovascular Exercise Tolerance: Good negative cardio ROS Normal cardiovascular examI Rhythm:Regular Rate:Normal     Neuro/Psych Bipolar Disorder On depakote and respiridal for mood negative neurological ROS  negative psych ROS   GI/Hepatic negative GI ROS, (+)     substance abuse  alcohol use and IV drug use, Hepatitis -, C, Toxin RelatedReports NO IVDA after ~1990s  No EtOH in 2 years    Endo/Other  negative endocrine ROS  Renal/GU negative Renal ROS  negative genitourinary   Musculoskeletal negative musculoskeletal ROS (+)   Abdominal   Peds negative pediatric ROS (+)  Hematology negative hematology ROS (+) H/o ITP per chart -denies spont bleeding episode  Last 101K   Anesthesia Other Findings   Reproductive/Obstetrics negative OB ROS                             Anesthesia Physical Anesthesia Plan  ASA: III  Anesthesia Plan: General   Post-op Pain Management:    Induction: Intravenous  PONV Risk Score and Plan: 2 and Propofol infusion and TIVA  Airway Management Planned: Simple Face Mask and Nasal Cannula  Additional Equipment:   Intra-op Plan:   Post-operative Plan:   Informed Consent: I have reviewed the patients History and Physical, chart, labs and discussed the procedure including the risks, benefits and alternatives for the proposed anesthesia with the patient or authorized representative who has indicated his/her  understanding and acceptance.     Dental advisory given  Plan Discussed with: CRNA  Anesthesia Plan Comments: (Plan Full PPE use  Plan GA with GETA as needed d/w pt -WTP with same after Q&A)        Anesthesia Quick Evaluation

## 2019-03-03 NOTE — Telephone Encounter (Signed)
T/C from [place] at [facility] said Dr. Gala Romney said this pt has appointment here on 03/11/2019 and that needs to be rescheduled for 3 months return visit.   Erline Levine, please cancel and send new appointment! Thanks!

## 2019-03-03 NOTE — Op Note (Signed)
[facility] Patient Name: Rachel Gross Procedure Date: 03/03/2019 8:10 AM MRN: [id number] Date of Birth: 1965-12-22 Attending MD: Norvel Richards , MD CSN: [id number] Age: 54 Admit Type: Outpatient Procedure:                Colonoscopy Indications:              Positive Cologuard test Providers:                Norvel Richards, MD, Jeanann Lewandowsky. Sharon Seller, RN,                            Nelma Rothman, Technician Referring MD:              Medicines:                Propofol per Anesthesia Complications:            No immediate complications. Estimated Blood Loss:     Estimated blood loss was minimal. Estimated blood                            loss was minimal. Procedure:                Pre-Anesthesia Assessment:                           - Prior to the procedure, a History and Physical                            was performed, and patient medications and                            allergies were reviewed. The patient's tolerance of                            previous anesthesia was also reviewed. The risks                            and benefits of the procedure and the sedation                            options and risks were discussed with the patient.                            All questions were answered, and informed consent                            was obtained. Prior Anticoagulants: The patient has                            taken no previous anticoagulant or antiplatelet                            agents. ASA Grade Assessment: II - A patient with  mild systemic disease. After reviewing the risks                            and benefits, the patient was deemed in                            satisfactory condition to undergo the procedure.                           After obtaining informed consent, the colonoscope                            was passed under direct vision. Throughout the                            procedure, the patient's blood  pressure, pulse, and                            oxygen saturations were monitored continuously. The                            CF-HQ190L ([id number]) scope was introduced through                            the anus and advanced to the the cecum, identified                            by appendiceal orifice and ileocecal valve. The                            colonoscopy was performed without difficulty. The                            patient tolerated the procedure well. The quality                            of the bowel preparation was adequate. Scope In: 8:34:20 AM Scope Out: 9:00:34 AM Scope Withdrawal Time: 0 hours 18 minutes 51 seconds  Total Procedure Duration: 0 hours 26 minutes 14 seconds  Findings:      The perianal and digital rectal examinations were normal.      Scattered small and large-mouthed diverticula were found in the entire       colon.      Three sessile polyps were found in the rectum and hepatic flexure. The       polyps were 4 to 6 mm in size. These polyps were removed with a cold       snare. Resection and retrieval were complete. Estimated blood loss was       minimal.      A 15 mm polyp was found in the cecum. The polyp was sessile. Nice lip       with [place]; the polyp was removed with a hot snare. Removed cleanly x2       passes. Resection and retrieval were complete. Estimated blood loss was       minimal.  The exam was otherwise without abnormality on direct and retroflexion       views. Clips x2 Impression:               - Diverticulosis in the entire examined colon.                           - Three 4 to 6 mm polyps in the rectum and at the                            hepatic flexure, removed with a cold snare.                            Resected and retrieved.                           - One 15 mm polyp in the cecum, removed with a hot                            snare. Resected and retrieved. Clips x2.                           - The examination was  otherwise normal on direct                            and retroflexion views. Moderate Sedation:      Moderate (conscious) sedation was personally administered by an       anesthesia professional. The following parameters were monitored: oxygen       saturation, heart rate, blood pressure, respiratory rate, EKG, adequacy       of pulmonary ventilation, and response to care. Recommendation:           - Patient has a contact number available for                            emergencies. The signs and symptoms of potential                            delayed complications were discussed with the                            patient. Return to normal activities tomorrow.                            Written discharge instructions were provided to the                            patient.                           - Advance diet as tolerated.                           - Continue present medications. No future RI until  clip gone                           - Repeat colonoscopy date to be determined after                            pending pathology results are reviewed for                            surveillance.                           - Return to GI office in 3 months. Procedure Code(s):        --- Professional ---                           [phone], Colonoscopy, flexible; with removal of                            tumor(s), polyp(s), or other lesion(s) by snare                            technique Diagnosis Code(s):        --- Professional ---                           K62.1, Rectal polyp                           K63.5, Polyp of colon                           R19.5, Other fecal abnormalities                           K57.30, Diverticulosis of large intestine without                            perforation or abscess without bleeding CPT copyright 2019 American Medical Association. All rights reserved. The codes documented in this report are preliminary and upon coder  review may  be revised to meet current compliance requirements. Cristopher Estimable. Hannalee Castor, MD Norvel Richards, MD 03/03/2019 11:39:14 AM This report has been signed electronically. Number of Addenda: 0

## 2019-03-03 NOTE — Discharge Instructions (Signed)
Colonoscopy Discharge Instructions  Read the instructions outlined below and refer to this sheet in the next few weeks. These discharge instructions provide you with general information on caring for yourself after you leave the hospital. Your doctor may also give you specific instructions. While your treatment has been planned according to the most current medical practices available, unavoidable complications occasionally occur. If you have any problems or questions after discharge, call Dr. Gala Romney at [phone]. ACTIVITY  You may resume your regular activity, but move at a slower pace for the next 24 hours.   Take frequent rest periods for the next 24 hours.   Walking will help get rid of the air and reduce the bloated feeling in your belly (abdomen).   No driving for 24 hours (because of the medicine (anesthesia) used during the test).    Do not sign any important legal documents or operate any machinery for 24 hours (because of the anesthesia used during the test).  NUTRITION  Drink plenty of fluids.   You may resume your normal diet as instructed by your doctor.   Begin with a light meal and progress to your normal diet. Heavy or fried foods are harder to digest and may make you feel sick to your stomach (nauseated).   Avoid alcoholic beverages for 24 hours or as instructed.  MEDICATIONS  You may resume your normal medications unless your doctor tells you otherwise.  WHAT YOU CAN EXPECT TODAY  Some feelings of bloating in the abdomen.   Passage of more gas than usual.   Spotting of blood in your stool or on the toilet paper.  IF YOU HAD POLYPS REMOVED DURING THE COLONOSCOPY:  No aspirin products for 7 days or as instructed.   No alcohol for 7 days or as instructed.   Eat a soft diet for the next 24 hours.  FINDING OUT THE RESULTS OF YOUR TEST Not all test results are available during your visit. If your test results are not back during the visit, make an appointment  with your caregiver to find out the results. Do not assume everything is normal if you have not heard from your caregiver or the medical facility. It is important for you to follow up on all of your test results.  SEEK IMMEDIATE MEDICAL ATTENTION IF:  You have more than a spotting of blood in your stool.   Your belly is swollen (abdominal distention).   You are nauseated or vomiting.   You have a temperature over 101.   You have abdominal pain or discomfort that is severe or gets worse throughout the day.    Colon polyp and diverticulosis information provided  No future MRI until clips gone  Further recommendations to follow pending review of pathology report  Office visit with [place] in 3 months if not already scheduled  At patient request, I called Samiah Ricklefs [phone] and reviewed results.    Colon Polyps  Polyps are tissue growths inside the body. Polyps can grow in many places, including the large intestine (colon). A polyp may be a round bump or a mushroom-shaped growth. You could have one polyp or several. Most colon polyps are noncancerous (benign). However, some colon polyps can become cancerous over time. Finding and removing the polyps early can help prevent this. What are the causes? The exact cause of colon polyps is not known. What increases the risk? You are more likely to develop this condition if you:  Have a family history of colon cancer or  colon polyps.  Are older than 35 or older than 45 if you are African American.  Have inflammatory bowel disease, such as ulcerative colitis or Crohn's disease.  Have certain hereditary conditions, such as: ? Familial adenomatous polyposis. ? Lynch syndrome. ? Turcot syndrome. ? Peutz-Jeghers syndrome.  Are overweight.  Smoke cigarettes.  Do not get enough exercise.  Drink too much alcohol.  Eat a diet that is high in fat and red meat and low in fiber.  Had childhood cancer that was treated with  abdominal radiation. What are the signs or symptoms? Most polyps do not cause symptoms. If you have symptoms, they may include:  Blood coming from your rectum when having a bowel movement.  Blood in your stool. The stool may look dark red or black.  Abdominal pain.  A change in bowel habits, such as constipation or diarrhea. How is this diagnosed? This condition is diagnosed with a colonoscopy. This is a procedure in which a lighted, flexible scope is inserted into the anus and then passed into the colon to examine the area. Polyps are sometimes found when a colonoscopy is done as part of routine cancer screening tests. How is this treated? Treatment for this condition involves removing any polyps that are found. Most polyps can be removed during a colonoscopy. Those polyps will then be tested for cancer. Additional treatment may be needed depending on the results of testing. Follow these instructions at home: Lifestyle  Maintain a healthy weight, or lose weight if recommended by your health care provider.  Exercise every day or as told by your health care provider.  Do not use any products that contain nicotine or tobacco, such as cigarettes and e-cigarettes. If you need help quitting, ask your health care provider.  If you drink alcohol, limit how much you have: ? 0-1 drink a day for women. ? 0-2 drinks a day for men.  Be aware of how much alcohol is in your drink. In the [place] one drink equals one 12 oz bottle of beer (355 mL), one 5 oz glass of wine (148 mL), or one 1 oz shot of hard liquor (44 mL). Eating and drinking   Eat foods that are high in fiber, such as fruits, vegetables, and whole grains.  Eat foods that are high in calcium and vitamin D, such as milk, cheese, yogurt, eggs, liver, fish, and broccoli.  Limit foods that are high in fat, such as fried foods and desserts.  Limit the amount of red meat and processed meat you eat, such as hot dogs, sausage, bacon, and  lunch meats. General instructions  Keep all follow-up visits as told by your health care provider. This is important. ? This includes having regularly scheduled colonoscopies. ? Talk to your health care provider about when you need a colonoscopy. Contact a health care provider if:  You have new or worsening bleeding during a bowel movement.  You have new or increased blood in your stool.  You have a change in bowel habits.  You lose weight for no known reason. Summary  Polyps are tissue growths inside the body. Polyps can grow in many places, including the colon.  Most colon polyps are noncancerous (benign), but some can become cancerous over time.  This condition is diagnosed with a colonoscopy.  Treatment for this condition involves removing any polyps that are found. Most polyps can be removed during a colonoscopy. This information is not intended to replace advice given to you by your health care  provider. Make sure you discuss any questions you have with your health care provider. Document Revised: 05/03/2017 Document Reviewed: 05/03/2017 Elsevier Patient Education  [place].    Diverticulosis  Diverticulosis is a condition that develops when small pouches (diverticula) form in the wall of the large intestine (colon). The colon is where water is absorbed and stool (feces) is formed. The pouches form when the inside layer of the colon pushes through weak spots in the outer layers of the colon. You may have a few pouches or many of them. The pouches usually do not cause problems unless they become inflamed or infected. When this happens, the condition is called diverticulitis. What are the causes? The cause of this condition is not known. What increases the risk? The following factors may make you more likely to develop this condition:  Being older than age 51. Your risk for this condition increases with age. Diverticulosis is rare among people younger than age 47.  By age 54, many people have it.  Eating a low-fiber diet.  Having frequent constipation.  Being overweight.  Not getting enough exercise.  Smoking.  Taking over-the-counter pain medicines, like aspirin and ibuprofen.  Having a family history of diverticulosis. What are the signs or symptoms? In most people, there are no symptoms of this condition. If you do have symptoms, they may include:  Bloating.  Cramps in the abdomen.  Constipation or diarrhea.  Pain in the lower left side of the abdomen. How is this diagnosed? Because diverticulosis usually has no symptoms, it is most often diagnosed during an exam for other colon problems. The condition may be diagnosed by:  Using a flexible scope to examine the colon (colonoscopy).  Taking an X-ray of the colon after dye has been put into the colon (barium enema).  Having a CT scan. How is this treated? You may not need treatment for this condition. Your health care provider may recommend treatment to prevent problems. You may need treatment if you have symptoms or if you previously had diverticulitis. Treatment may include:  Eating a high-fiber diet.  Taking a fiber supplement.  Taking a live bacteria supplement (probiotic).  Taking medicine to relax your colon. Follow these instructions at home: Medicines  Take over-the-counter and prescription medicines only as told by your health care provider.  If told by your health care provider, take a fiber supplement or probiotic. Constipation prevention Your condition may cause constipation. To prevent or treat constipation, you may need to:  Drink enough fluid to keep your urine pale yellow.  Take over-the-counter or prescription medicines.  Eat foods that are high in fiber, such as beans, whole grains, and fresh fruits and vegetables.  Limit foods that are high in fat and processed sugars, such as fried or sweet foods.  General instructions  Try not to strain when  you have a bowel movement.  Keep all follow-up visits as told by your health care provider. This is important. Contact a health care provider if you:  Have pain in your abdomen.  Have bloating.  Have cramps.  Have not had a bowel movement in 3 days. Get help right away if:  Your pain gets worse.  Your bloating becomes very bad.  You have a fever or chills, and your symptoms suddenly get worse.  You vomit.  You have bowel movements that are bloody or black.  You have bleeding from your rectum. Summary  Diverticulosis is a condition that develops when small pouches (diverticula) form in  the wall of the large intestine (colon).  You may have a few pouches or many of them.  This condition is most often diagnosed during an exam for other colon problems.  Treatment may include increasing the fiber in your diet, taking supplements, or taking medicines. This information is not intended to replace advice given to you by your health care provider. Make sure you discuss any questions you have with your health care provider. Document Revised: 08/15/2018 Document Reviewed: 08/15/2018 Elsevier Patient Education  [place] After These instructions provide you with information about caring for yourself after your procedure. Your health care provider may also give you more specific instructions. Your treatment has been planned according to current medical practices, but problems sometimes occur. Call your health care provider if you have any problems or questions after your procedure. What can I expect after the procedure? After your procedure, you may:  Feel sleepy for several hours.  Feel clumsy and have poor balance for several hours.  Feel forgetful about what happened after the procedure.  Have poor judgment for several hours.  Feel nauseous or vomit.  Have a sore throat if you had a breathing tube during the procedure. Follow  these instructions at home: For at least 24 hours after the procedure:      Have a responsible adult stay with you. It is important to have someone help care for you until you are awake and alert.  Rest as needed.  Do not: ? Participate in activities in which you could fall or become injured. ? Drive. ? Use heavy machinery. ? Drink alcohol. ? Take sleeping pills or medicines that cause drowsiness. ? Make important decisions or sign legal documents. ? Take care of children on your own. Eating and drinking  Follow the diet that is recommended by your health care provider.  If you vomit, drink water, juice, or soup when you can drink without vomiting.  Make sure you have little or no nausea before eating solid foods. General instructions  Take over-the-counter and prescription medicines only as told by your health care provider.  If you have sleep apnea, surgery and certain medicines can increase your risk for breathing problems. Follow instructions from your health care provider about wearing your sleep device: ? Anytime you are sleeping, including during daytime naps. ? While taking prescription pain medicines, sleeping medicines, or medicines that make you drowsy.  If you smoke, do not smoke without supervision.  Keep all follow-up visits as told by your health care provider. This is important. Contact a health care provider if:  You keep feeling nauseous or you keep vomiting.  You feel light-headed.  You develop a rash.  You have a fever. Get help right away if:  You have trouble breathing. Summary  For several hours after your procedure, you may feel sleepy and have poor judgment.  Have a responsible adult stay with you for at least 24 hours or until you are awake and alert. This information is not intended to replace advice given to you by your health care provider. Make sure you discuss any questions you have with your health care provider. Document Revised:  04/16/2017 Document Reviewed: 05/09/2015 Elsevier Patient Education  [place].

## 2019-03-03 NOTE — H&P (Signed)
@LOGO @   Primary Care Physician:  Celene Squibb, MD Primary Gastroenterologist:  Dr. Gala Romney  Pre-Procedure History & Physical: HPI:  Rachel Gross is a 54 y.o. female here for further evaluation of positive Cologuard.  No lower GI tract symptoms.  No prior colonoscopy.  Past Medical History:  Diagnosis Date  . Alcoholic hepatitis without ascites   . 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 COPD (chronic obstructive pulmonary disease) ([place])   . Emphysema lung ([place])   . Hepatitis C    s/p treatment with Epclusa  . History of HPV infection   . History of pneumonia   . Smoker     Past Surgical History:  Procedure Laterality Date  . BIOPSY  05/02/2018   Procedure: BIOPSY;  Surgeon: Daneil Dolin, MD;  Location: [facility];  Service: Endoscopy;;  gastric  . CESAREAN SECTION    . ESOPHAGOGASTRODUODENOSCOPY (EGD) WITH PROPOFOL N/A 05/02/2018   Procedure: ESOPHAGOGASTRODUODENOSCOPY (EGD) WITH PROPOFOL;  Surgeon: Daneil Dolin, MD;  Location: [facility];  Service: Endoscopy;  Laterality: N/A;  8:30am  . IR US GUIDE VASC ACCESS RIGHT  03/13/2018  . TOOTH EXTRACTION  01/31/2018   x 7    Prior to Admission medications   Medication Sig Start Date End Date Taking? Authorizing Provider  albuterol (VENTOLIN HFA) 108 (90 Base) MCG/ACT inhaler Inhale 2 puffs into the lungs every 4 (four) hours as needed for wheezing or shortness of breath.  01/17/19  Yes [provider]  diphenhydrAMINE (BENADRYL) 25 MG tablet Take 25 mg by mouth at bedtime.    Yes [provider]  divalproex (DEPAKOTE) 250 MG DR tablet Take 250 mg by mouth at bedtime.    Yes [provider]  pantoprazole (PROTONIX) 40 MG tablet Take one tablet before breakfast daily. Patient taking differently: Take 40 mg by mouth daily before breakfast.  08/29/18  Yes Mahala Menghini, PA-C  risperiDONE (RISPERDAL) 2 MG tablet Take 2 mg by mouth at bedtime.    Yes [provider]   SYMBICORT 160-4.5 MCG/ACT inhaler Inhale 2 puffs into the lungs 2 (two) times daily. 01/17/19  Yes [provider]    Allergies as of 12/05/2018 - Review Complete 12/05/2018  Allergen Reaction Noted  . Penicillins Anaphylaxis 09/10/2017  . Amoxicillin  09/10/2017  . Fish allergy Nausea Only 05/24/2017    Family History  Problem Relation Age of Onset  . Congenital heart disease Mother   . Bipolar disorder Mother   . Prostate cancer Brother   . Alzheimer's disease Paternal Grandmother   . Colon cancer Neg Hx     Social History   Socioeconomic History  . Marital status: Single    Spouse name: Not on file  . Number of children: Not on file  . Years of education: Not on file  . Highest education level: Not on file  Occupational History  . Not on file  Tobacco Use  . Smoking status: Current Every Day Smoker    Packs/day: 0.50    Years: 38.00    Pack years: 19.00    Types: Cigarettes  . Smokeless tobacco: Never Used  Substance and Sexual Activity  . Alcohol use: Not Currently    Comment: Last use of alcohol 03/2016  . Drug use: Not Currently  . Sexual activity: Not on file  Other Topics Concern  . Not on file  Social History Narrative  . Not on file   Social Determinants of  Health   Financial Resource Strain:   . Difficulty of Paying Living Expenses: Not on file  Food Insecurity:   . Worried About Charity fundraiser in the Last Year: Not on file  . Ran Out of Food in the Last Year: Not on file  Transportation Needs:   . Lack of Transportation (Medical): Not on file  . Lack of Transportation (Non-Medical): Not on file  Physical Activity:   . Days of Exercise per Week: Not on file  . Minutes of Exercise per Session: Not on file  Stress:   . Feeling of Stress : Not on file  Social Connections:   . Frequency of Communication with Friends and Family: Not on file  . Frequency of Social Gatherings with Friends and Family: Not on file  . Attends Religious  Services: Not on file  . Active Member of Clubs or Organizations: Not on file  . Attends Archivist Meetings: Not on file  . Marital Status: Not on file  Intimate Partner Violence:   . Fear of Current or Ex-Partner: Not on file  . Emotionally Abused: Not on file  . Physically Abused: Not on file  . Sexually Abused: Not on file    Review of Systems: See HPI, otherwise negative ROS  Physical Exam: BP (!) 131/92   Pulse 82   Temp 98.4 F (36.9 C) (Oral)   Resp 16   Ht 5\' 1"  (1.549 m)   SpO2 98%   BMI 33.33 kg/m  General:   Alert,  Well-developed, well-nourished, pleasant and cooperative in NAD Neck:  Supple; no masses or thyromegaly. No significant cervical adenopathy. Lungs:  Clear throughout to auscultation.   No wheezes, crackles, or rhonchi. No acute distress. Heart:  Regular rate and rhythm; no murmurs, clicks, rubs,  or gallops. Abdomen: Non-distended, normal bowel sounds.  Soft and nontender without appreciable mass or hepatosplenomegaly.  Pulses:  Normal pulses noted. Extremities:  Without clubbing or edema.  Impression/Plan: 54 year old lady with no lower GI tract symptoms here for positive Cologuard.  No prior colonoscopy.  Colonoscopy today per plan.  The risks, benefits, limitations, alternatives and imponderables have been reviewed with the patient. Questions have been answered. All parties are agreeable.     Notice: This dictation was prepared with Dragon dictation along with smaller phrase technology. Any transcriptional errors that result from this process are unintentional and may not be corrected upon review.

## 2019-03-04 ENCOUNTER — Encounter: Payer: Self-pay | Admitting: Internal Medicine

## 2019-03-04 LAB — SURGICAL PATHOLOGY

## 2019-03-11 ENCOUNTER — Ambulatory Visit: Payer: Medicaid Other | Admitting: Nurse Practitioner

## 2019-03-31 ENCOUNTER — Other Ambulatory Visit (HOSPITAL_COMMUNITY): Payer: Self-pay | Admitting: Internal Medicine

## 2019-03-31 ENCOUNTER — Other Ambulatory Visit: Payer: Self-pay | Admitting: Internal Medicine

## 2019-03-31 DIAGNOSIS — K7469 Other cirrhosis of liver: Secondary | ICD-10-CM

## 2019-03-31 DIAGNOSIS — B1920 Unspecified viral hepatitis C without hepatic coma: Secondary | ICD-10-CM

## 2019-04-04 ENCOUNTER — Ambulatory Visit (HOSPITAL_COMMUNITY): Payer: Medicaid Other

## 2019-04-11 ENCOUNTER — Ambulatory Visit (HOSPITAL_COMMUNITY)
Admission: RE | Admit: 2019-04-11 | Discharge: 2019-04-11 | Disposition: A | Discharge status: Home / Self Care | Payer: Medicaid Other | Source: Ambulatory Visit | Attending: Internal Medicine | Admitting: Internal Medicine

## 2019-04-11 ENCOUNTER — Other Ambulatory Visit: Payer: Self-pay

## 2019-04-11 DIAGNOSIS — K7469 Other cirrhosis of liver: Secondary | ICD-10-CM | POA: Diagnosis present

## 2019-04-11 DIAGNOSIS — B1920 Unspecified viral hepatitis C without hepatic coma: Secondary | ICD-10-CM | POA: Diagnosis not present

## 2019-05-01 ENCOUNTER — Telehealth (HOSPITAL_COMMUNITY): Payer: Self-pay | Admitting: Surgery

## 2019-05-01 ENCOUNTER — Other Ambulatory Visit (HOSPITAL_COMMUNITY): Payer: Self-pay | Admitting: Nurse Practitioner

## 2019-05-01 ENCOUNTER — Other Ambulatory Visit: Payer: Self-pay

## 2019-05-01 ENCOUNTER — Inpatient Hospital Stay (HOSPITAL_COMMUNITY): Payer: Medicaid Other | Attending: Hematology

## 2019-05-01 ENCOUNTER — Ambulatory Visit (HOSPITAL_COMMUNITY)
Admission: RE | Admit: 2019-05-01 | Discharge: 2019-05-01 | Disposition: A | Discharge status: Home / Self Care | Payer: Medicaid Other | Source: Ambulatory Visit | Attending: Nurse Practitioner | Admitting: Nurse Practitioner

## 2019-05-01 DIAGNOSIS — R911 Solitary pulmonary nodule: Secondary | ICD-10-CM | POA: Diagnosis not present

## 2019-05-01 DIAGNOSIS — D696 Thrombocytopenia, unspecified: Secondary | ICD-10-CM | POA: Diagnosis not present

## 2019-05-01 DIAGNOSIS — D693 Immune thrombocytopenic purpura: Secondary | ICD-10-CM

## 2019-05-01 LAB — CBC WITH DIFFERENTIAL/PLATELET
Abs Immature Granulocytes: 0.02 10*3/uL (ref 0.00–0.07)
Basophils Absolute: 0 10*3/uL (ref 0.0–0.1)
Basophils Relative: 0 %
Eosinophils Absolute: 0.1 10*3/uL (ref 0.0–0.5)
Eosinophils Relative: 1 %
HCT: 45.7 % (ref 36.0–46.0)
Hemoglobin: 14.9 g/dL (ref 12.0–15.0)
Immature Granulocytes: 0 %
Lymphocytes Relative: 30 %
Lymphs Abs: 2.8 10*3/uL (ref 0.7–4.0)
MCH: 30.4 pg (ref 26.0–34.0)
MCHC: 32.6 g/dL (ref 30.0–36.0)
MCV: 93.3 fL (ref 80.0–100.0)
Monocytes Absolute: 0.5 10*3/uL (ref 0.1–1.0)
Monocytes Relative: 5 %
Neutro Abs: 5.9 10*3/uL (ref 1.7–7.7)
Neutrophils Relative %: 64 %
Platelets: 142 10*3/uL — ABNORMAL LOW (ref 150–400)
RBC: 4.9 MIL/uL (ref 3.87–5.11)
RDW: 13.3 % (ref 11.5–15.5)
WBC: 9.3 10*3/uL (ref 4.0–10.5)
nRBC: 0 % (ref 0.0–0.2)

## 2019-05-01 LAB — COMPREHENSIVE METABOLIC PANEL
ALT: 23 U/L (ref 0–44)
AST: 26 U/L (ref 15–41)
Albumin: 4.5 g/dL (ref 3.5–5.0)
Alkaline Phosphatase: 79 U/L (ref 38–126)
Anion gap: 13 (ref 5–15)
BUN: 12 mg/dL (ref 6–20)
CO2: 23 mmol/L (ref 22–32)
Calcium: 9.8 mg/dL (ref 8.9–10.3)
Chloride: 99 mmol/L (ref 98–111)
Creatinine, Ser: 0.52 mg/dL (ref 0.44–1.00)
GFR calc Af Amer: >60 mL/min (ref >60)
GFR calc non Af Amer: >60 mL/min (ref >60)
Glucose, Bld: 113 mg/dL — ABNORMAL HIGH (ref 70–99)
Potassium: 4.1 mmol/L (ref 3.5–5.1)
Sodium: 135 mmol/L (ref 135–145)
Total Bilirubin: 0.6 mg/dL (ref 0.3–1.2)
Total Protein: 8.5 g/dL — ABNORMAL HIGH (ref 6.5–8.1)

## 2019-05-01 LAB — VITAMIN D 25 HYDROXY (VIT D DEFICIENCY, FRACTURES): Vit D, 25-Hydroxy: 19.59 ng/mL — ABNORMAL LOW (ref 30–100)

## 2019-05-01 LAB — LACTATE DEHYDROGENASE: LDH: 146 U/L (ref 98–192)

## 2019-05-01 LAB — VITAMIN B12: Vitamin B-12: 424 pg/mL (ref 180–914)

## 2019-05-01 MED ORDER — IOHEXOL 300 MG/ML  SOLN
75.0000 mL | Freq: Once | INTRAMUSCULAR | Status: AC | PRN
  Administered 2019-05-01: 75 mL via INTRAVENOUS

## 2019-05-01 NOTE — Telephone Encounter (Signed)
[facility]iology called to give the results of the pt's CT to [facility].  The CT showed interval progression of the nodule, highly suspicious for primary or metastatic neoplasm.    Rachel Gross was made aware of the results.

## 2019-05-08 ENCOUNTER — Ambulatory Visit (HOSPITAL_COMMUNITY): Payer: Medicaid Other | Admitting: Hematology

## 2019-05-12 ENCOUNTER — Other Ambulatory Visit (HOSPITAL_COMMUNITY): Payer: Medicaid Other

## 2019-05-13 ENCOUNTER — Ambulatory Visit (HOSPITAL_COMMUNITY): Payer: Medicaid Other | Admitting: Hematology

## 2019-05-13 ENCOUNTER — Other Ambulatory Visit: Payer: Self-pay

## 2019-05-13 ENCOUNTER — Ambulatory Visit
Admission: RE | Admit: 2019-05-13 | Discharge: 2019-05-13 | Disposition: A | Discharge status: Home / Self Care | Payer: Medicaid Other | Source: Ambulatory Visit | Attending: Nurse Practitioner | Admitting: Nurse Practitioner

## 2019-05-13 DIAGNOSIS — K7460 Unspecified cirrhosis of liver: Secondary | ICD-10-CM | POA: Diagnosis not present

## 2019-05-13 DIAGNOSIS — R911 Solitary pulmonary nodule: Secondary | ICD-10-CM | POA: Diagnosis not present

## 2019-05-13 LAB — GLUCOSE, CAPILLARY: Glucose-Capillary: 79 mg/dL (ref 70–99)

## 2019-05-13 MED ORDER — FLUDEOXYGLUCOSE F - 18 (FDG) INJECTION
9.1000 | Freq: Once | INTRAVENOUS | Status: AC | PRN
  Administered 2019-05-13: 9.22 via INTRAVENOUS

## 2019-05-15 ENCOUNTER — Other Ambulatory Visit: Payer: Self-pay

## 2019-05-15 ENCOUNTER — Encounter (HOSPITAL_COMMUNITY): Payer: Self-pay | Admitting: Hematology

## 2019-05-15 ENCOUNTER — Inpatient Hospital Stay (HOSPITAL_BASED_OUTPATIENT_CLINIC_OR_DEPARTMENT_OTHER): Payer: Medicaid Other | Admitting: Hematology

## 2019-05-15 VITALS — BP 125/65 | HR 89 | Temp 97.1°F | Resp 16 | Wt 182.0 lb

## 2019-05-15 DIAGNOSIS — R911 Solitary pulmonary nodule: Secondary | ICD-10-CM

## 2019-05-15 DIAGNOSIS — D696 Thrombocytopenia, unspecified: Secondary | ICD-10-CM | POA: Diagnosis not present

## 2019-05-15 NOTE — Progress Notes (Signed)
[place] [place], [place] [address]   CLINIC:  Medical Oncology/Hematology  PCP:  Celene Squibb, MD [place] [address] [phone]   REASON FOR VISIT: Low platelet count and lung nodule.  CURRENT THERAPY: Observation   INTERVAL HISTORY:  Rachel Gross 54 y.o. female seen for follow-up of low platelet count and the lung nodule.  Denies any cough or hemoptysis.  Smokes half pack per day for 40 years.  Denies any fevers, night sweats or weight loss.  Denies any easy bruising or bleeding.  Reportedly completed hepatitis C treatment.  She was told cured from it.  Appetite is 100%.  Energy levels are 100%.  No new onset pains reported.    REVIEW OF SYSTEMS:  Review of Systems  All other systems reviewed and are negative.    PAST MEDICAL/SURGICAL HISTORY:  Past Medical History:  Diagnosis Date  . Alcoholic hepatitis without ascites   . 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 COPD (chronic obstructive pulmonary disease) ([place])   . Emphysema lung ([place])   . Hepatitis C    s/p treatment with Epclusa  . History of HPV infection   . History of pneumonia   . Smoker    Past Surgical History:  Procedure Laterality Date  . BIOPSY  05/02/2018   Procedure: BIOPSY;  Surgeon: Daneil Dolin, MD;  [facility];  Service: Endoscopy;;  gastric  . CESAREAN SECTION    . COLONOSCOPY WITH PROPOFOL N/A 03/03/2019   Procedure: COLONOSCOPY WITH PROPOFOL;  Surgeon: Daneil Dolin, MD;  [facility];  Service: Endoscopy;  Laterality: N/A;  9:15am  . ESOPHAGOGASTRODUODENOSCOPY (EGD) WITH PROPOFOL N/A 05/02/2018   Procedure: ESOPHAGOGASTRODUODENOSCOPY (EGD) WITH PROPOFOL;  Surgeon: Daneil Dolin, MD;  [facility];  Service: Endoscopy;  Laterality: N/A;  8:30am  . HEMOSTASIS CLIP PLACEMENT  03/03/2019   Procedure: HEMOSTASIS CLIP PLACEMENT;  Surgeon: Daneil Dolin, MD;  [facility];  Service: Endoscopy;;  . IR US GUIDE [employer] RIGHT  03/13/2018  . POLYPECTOMY  03/03/2019   Procedure: POLYPECTOMY;  Surgeon: Daneil Dolin, MD;  [facility];  Service: Endoscopy;;  . TOOTH EXTRACTION  01/31/2018   x 7     SOCIAL HISTORY:  Social History   Socioeconomic History  . Marital status: Single    Spouse name: Not on file  . Number of children: Not on file  . Years of education: Not on file  . Highest education level: Not on file  Occupational History  . Not on file  Tobacco Use  . Smoking status: Current Every Day Smoker    Packs/day: 0.50    Years: 38.00    Pack years: 19.00    Types: Cigarettes  . Smokeless tobacco: Never Used  Substance and Sexual Activity  . Alcohol use: Not Currently    Comment: Last use of alcohol 03/2016  . Drug use: Not Currently  . Sexual activity: Not on file  Other Topics Concern  . Not on file  Social History Narrative  . Not on file   Social Determinants of Health   Financial Resource Strain:   . Difficulty of Paying Living Expenses:   Food Insecurity:   . Worried About Charity fundraiser in the Last Year:   . Arboriculturist in the Last Year:   Transportation Needs:   . Lack  of Transportation (Medical):   Marland Kitchen Lack of Transportation (Non-Medical):   Physical Activity:   . Days of Exercise per Week:   . Minutes of Exercise per Session:   Stress:   . Feeling of Stress :   Social Connections:   . Frequency of Communication with Friends and Family:   . Frequency of Social Gatherings with Friends and Family:   . Attends Religious Services:   . Active Member of Clubs or Organizations:   . Attends Archivist Meetings:   Marland Kitchen Marital Status:   Intimate Partner Violence:   . Fear of Current or Ex-Partner:   . Emotionally Abused:   Marland Kitchen Physically Abused:   . Sexually Abused:     FAMILY HISTORY:  Family History  Problem Relation Age of Onset  . Congenital heart disease Mother   . Bipolar  disorder Mother   . Prostate cancer Brother   . Alzheimer's disease Paternal Grandmother   . Colon cancer Neg Hx     CURRENT MEDICATIONS:  Outpatient Encounter Medications as of 05/15/2019  Medication Sig  . diphenhydrAMINE (BENADRYL) 25 MG tablet Take 25 mg by mouth at bedtime.   . divalproex (DEPAKOTE) 250 MG DR tablet Take 250 mg by mouth at bedtime.   . pantoprazole (PROTONIX) 40 MG tablet Take one tablet before breakfast daily. (Patient taking differently: Take 40 mg by mouth daily before breakfast. )  . risperiDONE (RISPERDAL) 2 MG tablet Take 2 mg by mouth at bedtime.   . SYMBICORT 160-4.5 MCG/ACT inhaler Inhale 2 puffs into the lungs 2 (two) times daily.  Marland Kitchen albuterol (VENTOLIN HFA) 108 (90 Base) MCG/ACT inhaler Inhale 2 puffs into the lungs every 4 (four) hours as needed for wheezing or shortness of breath.    No facility-administered encounter medications on file as of 05/15/2019.    ALLERGIES:  Allergies  Allergen Reactions  . Penicillins Anaphylaxis    Throat swelled Did it involve swelling of the face/tongue/throat, SOB, or low BP? Yes Did it involve sudden or severe rash/hives, skin peeling, or any reaction on the inside of your mouth or nose? No Did you need to seek medical attention at a hospital or doctor's office? No When did it last happen?childhood allergy If all above answers are "NO", may proceed with cephalosporin use.   Marland Kitchen Amoxicillin     hallucinations Did it involve swelling of the face/tongue/throat, SOB, or low BP? No Did it involve sudden or severe rash/hives, skin peeling, or any reaction on the inside of your mouth or nose? No Did you need to seek medical attention at a hospital or doctor's office? Yes When did it last happen?July 2019 If all above answers are "NO", may proceed with cephalosporin use.   . Fish Allergy Nausea Only     Vital signs: I have reviewed her vitals.  Physical Exam Vitals reviewed.  Constitutional:       Appearance: Normal appearance.  Pulmonary:     Effort: Pulmonary effort is normal.     Breath sounds: Normal breath sounds.  Neurological:     General: No focal deficit present.     Mental Status: She is alert and oriented to person, place, and time.  Psychiatric:        Mood and Affect: Mood normal.        Behavior: Behavior normal.      LABORATORY DATA:  I have reviewed the labs as listed.  CBC    Component Value Date/Time   WBC 9.3 05/01/2019  1004   RBC 4.90 05/01/2019 1004   HGB 14.9 05/01/2019 1004   HCT 45.7 05/01/2019 1004   PLT 142 (L) 05/01/2019 1004   MCV 93.3 05/01/2019 1004   MCH 30.4 05/01/2019 1004   MCHC 32.6 05/01/2019 1004   RDW 13.3 05/01/2019 1004   LYMPHSABS 2.8 05/01/2019 1004   MONOABS 0.5 05/01/2019 1004   EOSABS 0.1 05/01/2019 1004   BASOSABS 0.0 05/01/2019 1004   CMP Latest Ref Rng & Units 05/01/2019 12/23/2018 08/12/2018  Glucose 70 - 99 mg/dL 113(H) 122(H) 88  BUN 6 - 20 mg/dL 12 8 8   Creatinine 0.44 - 1.00 mg/dL 0.52 0.56 0.52  Sodium 135 - 145 mmol/L 135 138 136  Potassium 3.5 - 5.1 mmol/L 4.1 4.1 5.3(H)  Chloride 98 - 111 mmol/L 99 101 100  CO2 22 - 32 mmol/L 23 24 22   Calcium 8.9 - 10.3 mg/dL 9.8 9.5 9.6  Total Protein 6.5 - 8.1 g/dL 8.5(H) 8.1 8.4(H)  Total Bilirubin 0.3 - 1.2 mg/dL 0.6 0.7 0.9  Alkaline Phos 38 - 126 U/L 79 72 79  AST 15 - 41 U/L 26 33 68(H)  ALT 0 - 44 U/L 23 24 32    I have independently reviewed the PET scan and discussed with her.   ASSESSMENT & PLAN:   Nodule of lower lobe of right lung 1.  Right lower lobe lung nodule: -Patient smokes half pack per day for 40 years. -CT scan of the chest with contrast on 05/01/2019 showed interval progression of the right lower lobe pulmonary nodule measuring 1.4 x 1.3 cm, previously 1.1 x 0.7 cm in February 2020. -6 mm left lower lobe nodule. -PET CT scan on 05/13/2019 showed 13 mm nodule in the right lower lobe with SUV 4.0.  Left lower lobe nodule does not have metabolic  activity but is below the size limit. -We will order pulmonary function tests and make a referral to Dr. Roxan Hockey.  2.  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  She was reportedly told that she was cured of hep C. -Platelet count improved to 142.  She denies any bleeding issues. -I plan to see her back in 3 months with repeat labs.        Orders placed this encounter:  Orders Placed This Encounter  Procedures  . Pulmonary Function Test      Rachel Finders, FNP-C [facility] [phone]

## 2019-05-15 NOTE — Assessment & Plan Note (Addendum)
1.  Right lower lobe lung nodule: -Patient smokes half pack per day for 40 years. -CT scan of the chest with contrast on 05/01/2019 showed interval progression of the right lower lobe pulmonary nodule measuring 1.4 x 1.3 cm, previously 1.1 x 0.7 cm in February 2020. -6 mm left lower lobe nodule. -PET CT scan on 05/13/2019 showed 13 mm nodule in the right lower lobe with SUV 4.0.  Left lower lobe nodule does not have metabolic activity but is below the size limit. -We will order pulmonary function tests and make a referral to Dr. Roxan Hockey.  2.  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  She was reportedly told that she was cured of hep C. -Platelet count improved to 142.  She denies any bleeding issues. -I plan to see her back in 3 months with repeat labs.

## 2019-05-15 NOTE — Patient Instructions (Signed)
[place] at [facility] Discharge Instructions  You were seen today by Dr. Delton Coombes. He went over your recent test results. Your scan shows that you have lung cancer. He will refer you to Dr. Roxan Hockey for a surgery consult. He will schedule you for a pulmonary function test.  He will see you back in 3 months for labs and follow up.   Thank you for choosing [place] at [facility] to provide your oncology and hematology care.  To afford each patient quality time with our provider, please arrive at least 15 minutes before your scheduled appointment time.   If you have a lab appointment with the [facility] please come in thru the  Main Entrance and check in at the main information desk  You need to re-schedule your appointment should you arrive 10 or more minutes late.  We strive to give you quality time with our providers, and arriving late affects you and other patients whose appointments are after yours.  Also, if you no show three or more times for appointments you may be dismissed from the clinic at the providers discretion.     Again, thank you for choosing [facility].  Our hope is that these requests will decrease the amount of time that you wait before being seen by our physicians.       _____________________________________________________________  Should you have questions after your visit to [facility], please contact our office at [phone] between the hours of 8:00 a.m. and 4:30 p.m.  Voicemails left after 4:00 p.m. will not be returned until the following business day.  For prescription refill requests, have your pharmacy contact our office and allow 72 hours.    Cancer Center Support Programs:   > Cancer Support Group  2nd Tuesday of the month 1pm-2pm, Journey Room

## 2019-05-23 ENCOUNTER — Other Ambulatory Visit: Payer: Self-pay

## 2019-05-23 ENCOUNTER — Other Ambulatory Visit (HOSPITAL_COMMUNITY)
Admission: RE | Admit: 2019-05-23 | Discharge: 2019-05-23 | Disposition: A | Discharge status: Home / Self Care | Payer: Medicaid Other | Source: Ambulatory Visit | Attending: Hematology | Admitting: Hematology

## 2019-05-23 DIAGNOSIS — Z20822 Contact with and (suspected) exposure to covid-19: Secondary | ICD-10-CM | POA: Diagnosis not present

## 2019-05-23 DIAGNOSIS — Z01812 Encounter for preprocedural laboratory examination: Secondary | ICD-10-CM | POA: Diagnosis not present

## 2019-05-24 LAB — SARS CORONAVIRUS 2 (TAT 6-24 HRS): SARS Coronavirus 2: NEGATIVE

## 2019-05-27 ENCOUNTER — Ambulatory Visit (HOSPITAL_COMMUNITY)
Admission: RE | Admit: 2019-05-27 | Discharge: 2019-05-27 | Disposition: A | Discharge status: Home / Self Care | Payer: Medicaid Other | Source: Ambulatory Visit | Attending: Hematology | Admitting: Hematology

## 2019-05-27 ENCOUNTER — Other Ambulatory Visit: Payer: Self-pay

## 2019-05-27 DIAGNOSIS — R911 Solitary pulmonary nodule: Secondary | ICD-10-CM | POA: Diagnosis not present

## 2019-05-27 LAB — PULMONARY FUNCTION TEST
DL/VA % pred: 100 %
DL/VA: 4.38 ml/min/mmHg/L
DLCO unc % pred: 77 %
DLCO unc: 14.37 ml/min/mmHg
FEF 25-75 Post: 1.31 L/sec
FEF 25-75 Pre: 0.99 L/sec
FEF2575-%Change-Post: 31 %
FEF2575-%Pred-Post: 54 %
FEF2575-%Pred-Pre: 41 %
FEV1-%Change-Post: 5 %
FEV1-%Pred-Post: 68 %
FEV1-%Pred-Pre: 64 %
FEV1-Post: 1.65 L
FEV1-Pre: 1.56 L
FEV1FVC-%Change-Post: 0 %
FEV1FVC-%Pred-Pre: 90 %
FEV6-%Change-Post: 5 %
FEV6-%Pred-Post: 77 %
FEV6-%Pred-Pre: 73 %
FEV6-Post: 2.3 L
FEV6-Pre: 2.18 L
FEV6FVC-%Change-Post: 0 %
FEV6FVC-%Pred-Post: 102 %
FEV6FVC-%Pred-Pre: 103 %
FVC-%Change-Post: 6 %
FVC-%Pred-Post: 75 %
FVC-%Pred-Pre: 70 %
FVC-Post: 2.32 L
FVC-Pre: 2.18 L
Post FEV1/FVC ratio: 71 %
Post FEV6/FVC ratio: 99 %
Pre FEV1/FVC ratio: 72 %
Pre FEV6/FVC Ratio: 100 %

## 2019-05-27 MED ORDER — ALBUTEROL SULFATE (2.5 MG/3ML) 0.083% IN NEBU
2.5000 mg | INHALATION_SOLUTION | Freq: Once | RESPIRATORY_TRACT | Status: AC
  Administered 2019-05-27: 2.5 mg via RESPIRATORY_TRACT

## 2019-06-03 ENCOUNTER — Other Ambulatory Visit: Payer: Self-pay

## 2019-06-03 ENCOUNTER — Encounter: Payer: Self-pay | Admitting: *Deleted

## 2019-06-03 ENCOUNTER — Institutional Professional Consult (permissible substitution): Payer: Medicaid Other | Admitting: Thoracic Surgery (Cardiothoracic Vascular Surgery)

## 2019-06-03 ENCOUNTER — Other Ambulatory Visit: Payer: Self-pay | Admitting: *Deleted

## 2019-06-03 ENCOUNTER — Encounter: Payer: Self-pay | Admitting: Thoracic Surgery (Cardiothoracic Vascular Surgery)

## 2019-06-03 VITALS — BP 120/75 | HR 92 | Temp 98.1°F | Resp 24 | Ht 61.0 in | Wt 182.0 lb

## 2019-06-03 DIAGNOSIS — R911 Solitary pulmonary nodule: Secondary | ICD-10-CM | POA: Diagnosis not present

## 2019-06-03 NOTE — Progress Notes (Signed)
PCP is Celene Squibb, MD Referring Provider is Derek Jack, MD  Chief Complaint  Patient presents with  . Lung Lesion    Surgical eval, Chest CT 05/01/19, PET Scan 05/13/19    HPI: Rachel Gross is sent for consultation regarding a right lower lobe lung nodule.  Rachel Gross is a 54 year old woman with a past medical history significant for tobacco abuse, COPD, ethanol abuse, hepatitis C (treated), cirrhosis, portal hypertensive gastropathy, thrombocytopenia and atypical squamous cell of the cervix.  She had a CT of the abdomen in February 2020 which showed a 1.1 x 0.9 x 0.7 cm right lower lobe pulmonary nodule.  There is a report from 2017 from [place] of a CT chest which showed a 5 mm left lower lobe nodule and 2 new pulmonary nodules in the right upper lobe measuring 7 and 6 mm.  No mention of the right lower lobe nodule.  She had a chest CT in April 2021 which showed the nodule increased in size to 1.4 x 1.3 cm.  There was no adenopathy.  PET/CT showed the lower lobe nodule was hypermetabolic with an SUV of 4.  No evidence of regional or distant metastases.  She gets short of breath with exertion but can walk up a flight of stairs.  She might have to stop and catch her breath at the top.  She has a 40-pack-year history of smoking.  She says she is currently down to about 1/2 pack/day.  She does have wheezing and occasional cough.  She is not having any chest pain, pressure, or tightness.  She said no change in appetite or weight loss.  Zubrod Score: At the time of surgery this patient's most appropriate activity status/level should be described as: []     0    Normal activity, no symptoms [x]     1    Restricted in physical strenuous activity but ambulatory, able to do out light work []     2    Ambulatory and capable of self care, unable to do work activities, up and about >50 % of waking hours                              []     3    Only limited self care, in bed greater  than 50% of waking hours []     4    Completely disabled, no self care, confined to bed or chair []     5    Moribund  Past Medical History:  Diagnosis Date  . Alcoholic hepatitis without ascites   . 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 COPD (chronic obstructive pulmonary disease) ([place])   . Emphysema lung ([place])   . Hepatitis C    s/p treatment with Epclusa  . History of HPV infection   . History of pneumonia   . Smoker     Past Surgical History:  Procedure Laterality Date  . BIOPSY  05/02/2018   Procedure: BIOPSY;  Surgeon: Daneil Dolin, MD;  [facility];  Service: Endoscopy;;  gastric  . CESAREAN SECTION    . COLONOSCOPY WITH PROPOFOL N/A 03/03/2019   Procedure: COLONOSCOPY WITH PROPOFOL;  Surgeon: Daneil Dolin, MD;  Location: [facility];  Service: Endoscopy;  Laterality: N/A;  9:15am  . ESOPHAGOGASTRODUODENOSCOPY (EGD) WITH PROPOFOL N/A 05/02/2018   Procedure: ESOPHAGOGASTRODUODENOSCOPY (EGD) WITH PROPOFOL;  Surgeon: Daneil Dolin, MD;  [facility]: [facility];  Service: Endoscopy;  Laterality: N/A;  8:30am  . HEMOSTASIS CLIP PLACEMENT  03/03/2019   Procedure: HEMOSTASIS CLIP PLACEMENT;  Surgeon: Daneil Dolin, MD;  [facility];  Service: Endoscopy;;  . IR US GUIDE [place] RIGHT  03/13/2018  . POLYPECTOMY  03/03/2019   Procedure: POLYPECTOMY;  Surgeon: Daneil Dolin, MD;  [facility]: [facility];  Service: Endoscopy;;  . TOOTH EXTRACTION  01/31/2018   x 7    Family History  Problem Relation Age of Onset  . Congenital heart disease Mother   . Bipolar disorder Mother   . Prostate cancer Brother   . Alzheimer's disease Paternal Grandmother   . Colon cancer Neg Hx     Social History Social History   Tobacco Use  . Smoking status: Current Every Day Smoker    Packs/day: 0.50    Years: 38.00    Pack years: 19.00    Types: Cigarettes  . Smokeless tobacco: Never Used  Substance Use Topics  . Alcohol use: Not  Currently    Comment: Last use of alcohol 03/2016  . Drug use: Not Currently    Current Outpatient Medications  Medication Sig Dispense Refill  . albuterol (VENTOLIN HFA) 108 (90 Base) MCG/ACT inhaler Inhale 2 puffs into the lungs every 4 (four) hours as needed for wheezing or shortness of breath.     . diphenhydrAMINE (BENADRYL) 25 MG tablet Take 25 mg by mouth at bedtime.     . divalproex (DEPAKOTE) 250 MG DR tablet Take 250 mg by mouth at bedtime.     . pantoprazole (PROTONIX) 40 MG tablet Take one tablet before breakfast daily. (Patient taking differently: Take 40 mg by mouth daily before breakfast. ) 90 tablet 3  . risperiDONE (RISPERDAL) 2 MG tablet Take 2 mg by mouth at bedtime.     . SYMBICORT 160-4.5 MCG/ACT inhaler Inhale 2 puffs into the lungs 2 (two) times daily.     No current facility-administered medications for this visit.    Allergies  Allergen Reactions  . Penicillins Anaphylaxis    Throat swelled Did it involve swelling of the face/tongue/throat, SOB, or low BP? Yes Did it involve sudden or severe rash/hives, skin peeling, or any reaction on the inside of your mouth or nose? No Did you need to seek medical attention at a hospital or doctor's office? No When did it last happen?childhood allergy If all above answers are "NO", may proceed with cephalosporin use.   Marland Kitchen Amoxicillin     hallucinations Did it involve swelling of the face/tongue/throat, SOB, or low BP? No Did it involve sudden or severe rash/hives, skin peeling, or any reaction on the inside of your mouth or nose? No Did you need to seek medical attention at a hospital or doctor's office? Yes When did it last happen?July 2019 If all above answers are "NO", may proceed with cephalosporin use.   . Fish Allergy Nausea Only    Review of Systems  Constitutional: Negative for activity change, appetite change and unexpected weight change.  HENT: Positive for dental problem (dentures). Negative for  trouble swallowing and voice change.   Respiratory: Positive for shortness of breath and wheezing.   Cardiovascular: Negative for chest pain and leg swelling.  Gastrointestinal: Negative for abdominal distention.  Genitourinary: Negative for difficulty urinating and dysuria.  Musculoskeletal:       Leg cramps  Neurological: Negative for weakness.  Hematological: Negative for adenopathy. Does not bruise/bleed easily.  Psychiatric/Behavioral:  Positive for dysphoric mood. The patient is nervous/anxious.     BP 120/75 (BP Location: Left Arm, Patient Position: Sitting, Cuff Size: Normal)   Pulse 92   Temp 98.1 F (36.7 C) (Temporal)   Resp (!) 24   Ht 5\' 1"  (1.549 m)   Wt 182 lb (82.6 kg)   SpO2 94% Comment: RA  BMI 34.39 kg/m  Physical Exam Vitals reviewed.  Constitutional:      General: She is not in acute distress. HENT:     Head: Normocephalic and atraumatic.  Eyes:     General: No scleral icterus.    Extraocular Movements: Extraocular movements intact.  Cardiovascular:     Rate and Rhythm: Normal rate and regular rhythm.     Heart sounds: Normal heart sounds.  Pulmonary:     Effort: Pulmonary effort is normal. No respiratory distress.     Breath sounds: Wheezing present. No rales.  Abdominal:     General: Bowel sounds are normal. There is no distension.     Palpations: Abdomen is soft.     Tenderness: There is no abdominal tenderness.  Musculoskeletal:        General: No swelling.     Cervical back: Neck supple.  Lymphadenopathy:     Cervical: No cervical adenopathy.  Skin:    General: Skin is warm and dry.  Neurological:     General: No focal deficit present.     Mental Status: She is alert and oriented to person, place, and time.     Cranial Nerves: No cranial nerve deficit.     Motor: No weakness.    Diagnostic Tests: CT CHEST WITH CONTRAST  TECHNIQUE: Multidetector CT imaging of the chest was performed during intravenous contrast  administration.  CONTRAST:  51mL OMNIPAQUE IOHEXOL 300 MG/ML  SOLN  COMPARISON:  Abdomen/pelvis CT 03/13/2018.  FINDINGS: Cardiovascular: The heart size is normal. No substantial pericardial effusion. Coronary artery calcification is evident. Atherosclerotic calcification is noted in the wall of the thoracic aorta.  Mediastinum/Nodes: 15 mm short axis subcarinal lymph node is mildly enlarged. No other mediastinal lymphadenopathy evident There is no hilar lymphadenopathy. The esophagus has normal imaging features. There is no axillary lymphadenopathy.  Lungs/Pleura: Centrilobular and paraseptal emphysema evident. 7 mm right middle lobe perifissural nodule on 75/4 was not included on the prior study. This may well be a lymph node.  Interval progression of the right lower lobe pulmonary nodule measuring 1.4 x 1.3 cm today compared to 1.1 x 0.7 cm on the prior study.  6 mm left lower lobe nodule on 64/4 was not included on the prior study.  No focal airspace consolidation.  No pleural effusion.  Upper Abdomen: Nodular hepatic contour is compatible with cirrhosis. Recanalization of the paraumbilical vein noted. Otherwise unremarkable.  Musculoskeletal: No worrisome lytic or sclerotic osseous abnormality.  IMPRESSION: 1. Interval progression of the right lower lobe pulmonary nodule measuring up to 1.4 cm today compared to 1.1 cm on the prior study. Imaging features highly suspicious for primary or metastatic neoplasm. PET-CT may prove helpful to further evaluate. 2. 7 mm right middle lobe perifissural nodule has a triangular configuration and may well be a subpleural lymph node. The 6 mm left lower lobe pulmonary nodule has a more rounded configuration and close attention on follow-up recommended. 3. Cirrhosis with recanalized paraumbilical vein suggesting portal venous hypertension. 4. Aortic Atherosclerosis (ICD10-I70.0) and Emphysema (ICD10-J43.9).  These  results will be called to the ordering clinician or representative by the Radiologist Assistant,  and communication documented in the PACS or [employer].   Electronically Signed   By: Misty Stanley M.D.   On: 05/01/2019 13:35 NUCLEAR MEDICINE PET SKULL BASE TO THIGH  TECHNIQUE: 9.2 mCi F-18 FDG was injected intravenously. Full-ring PET imaging was performed from the skull base to thigh after the radiotracer. CT data was obtained and used for attenuation correction and anatomic localization.  Fasting blood glucose: 79 mg/dl  COMPARISON:  CT 05/01/2019, 06/05/2018  FINDINGS: Mediastinal blood pool activity: SUV max 1.7  Liver activity: SUV max NA  NECK: No hypermetabolic lymph nodes in the neck.  Incidental CT findings: none  CHEST: 13 mm nodule in the RIGHT lower lobe with associated metabolic activity with SUV max equal 4.0 (image 111/3). This corresponds to the enlarging nodule on comparison chest CT.  Smaller nodule in the LEFT lower lobe measuring 6 mm (image 90/3) does not have associated metabolic activity but is below the size limit for accurate characterization  Probable inter fissural lymph node in the RIGHT middle lobe (image 41/9) has no metabolic activity.  No hypermetabolic mediastinal lymph nodes or enlarged mediastinal.  Incidental CT findings: none  ABDOMEN/PELVIS: No abnormal hypermetabolic activity within the liver, pancreas, adrenal glands, or spleen. No hypermetabolic lymph nodes in the abdomen or pelvis.  Incidental CT findings: Nodular contour of the liver. Uterus normal. Atherosclerotic calcification of the aorta.  SKELETON: No focal hypermetabolic activity to suggest skeletal metastasis.  Incidental CT findings: none  IMPRESSION: 1. Enlarging hypermetabolic RIGHT lobe nodule is concerning for bronchogenic carcinoma. No hypermetabolic mediastinal lymph nodes. Favor stage T1a N0 M0. 2. A 6 mm LEFT lobe pulmonary  nodule too small to characterize by FDG PET scan. Cannot exclude synchronous bronchogenic carcinoma. Recommend follow-up CT scan 6 months. 3. Cirrhotic liver.   Electronically Signed   By: Suzy Bouchard M.D.   On: 05/13/2019 18:51  I personally reviewed the CT and PET/CT images and concur with the findings noted above  Pulmonary function testing 05/27/2019 FVC 2.18 (70%) FEV1 1.56 (64%) FEV1 1.65 (68%) postbronchodilator DLCO 14.37 (77%) uncorrected  Impression: Rachel Gross is a 54 year old woman with a past medical history significant for tobacco abuse, COPD, ethanol abuse, hepatitis C (treated), cirrhosis, portal hypertensive gastropathy, thrombocytopenia and atypical squamous cell of the cervix.  She had an incidental right lower lobe lung nodule seen on a CT of the abdomen a little over a year ago.  Her recent chest CT showed the nodule was significantly larger.  On PET CT the nodule is hypermetabolic with an SUV of 4.   I reviewed the CT and PET/CT images with Ms. Nimmons and her father.  We discussed the differential diagnosis.  Given her age, tobacco abuse, growing nodule, and metabolic activity on PET, this has to be considered a new primary bronchogenic carcinoma unless it can be proven otherwise.  Carcinoid tumor is also within the differential diagnosis but the treatment would be the same.  Other etiologies such as infectious and inflammatory nodules are far less likely given the morphology of the lesion.  We discussed possible options for work-up and treatment.  We discussed possibility of doing a bronchoscopic biopsy.  The lesion is too central for a CT-guided biopsy.  Bronchoscopic biopsy was positive surgery would be recommended.  If it was negative I would not be comfortable watching this nodule given the findings and would still recommend surgery.  Therefore, I did not recommend bronchoscopy prior to resection.  I recommended that we proceed with robotic  assisted  right lower lobectomy for definitive diagnosis and treatment.  I informed them of the general nature of the procedure including the need for general anesthesia, the incisions to be used, the use of drains to postoperatively, the expected hospital stay, and the overall recovery.  I informed them of the indications, risks, benefits, and alternatives.  They understand the risks include, but are not limited to death, MI, DVT, PE, bleeding, possible need for transfusion, infection, prolonged air leak, cardiac arrhythmias, as well as possibility of other unforeseeable complications including chronic pain.  She understands and accepts the risks and wishes to proceed.  Plan: Robotic assisted right lower lobectomy on Thursday, 06/19/2019  [name], MD [facility] [phone]

## 2019-06-03 NOTE — H&P (View-Only) (Signed)
PCP is Celene Squibb, MD Referring Provider is Derek Jack, MD  Chief Complaint  Patient presents with  . Lung Lesion    Surgical eval, Chest CT 05/01/19, PET Scan 05/13/19    HPI: Rachel Gross is sent for consultation regarding a right lower lobe lung nodule.  Rachel Gross is a 54 year old woman with a past medical history significant for tobacco abuse, COPD, ethanol abuse, hepatitis C (treated), cirrhosis, portal hypertensive gastropathy, thrombocytopenia and atypical squamous cell of the cervix.  She had a CT of the abdomen in February 2020 which showed a 1.1 x 0.9 x 0.7 cm right lower lobe pulmonary nodule.  There is a report from 2017 from [place] of a CT chest which showed a 5 mm left lower lobe nodule and 2 new pulmonary nodules in the right upper lobe measuring 7 and 6 mm.  No mention of the right lower lobe nodule.  She had a chest CT in April 2021 which showed the nodule increased in size to 1.4 x 1.3 cm.  There was no adenopathy.  PET/CT showed the lower lobe nodule was hypermetabolic with an SUV of 4.  No evidence of regional or distant metastases.  She gets short of breath with exertion but can walk up a flight of stairs.  She might have to stop and catch her breath at the top.  She has a 40-pack-year history of smoking.  She says she is currently down to about 1/2 pack/day.  She does have wheezing and occasional cough.  She is not having any chest pain, pressure, or tightness.  She said no change in appetite or weight loss.  Zubrod Score: At the time of surgery this patient's most appropriate activity status/level should be described as: []     0    Normal activity, no symptoms [x]     1    Restricted in physical strenuous activity but ambulatory, able to do out light work []     2    Ambulatory and capable of self care, unable to do work activities, up and about >50 % of waking hours                              []     3    Only limited self care, in bed greater  than 50% of waking hours []     4    Completely disabled, no self care, confined to bed or chair []     5    Moribund  Past Medical History:  Diagnosis Date  . Alcoholic hepatitis without ascites   . 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 COPD (chronic obstructive pulmonary disease) ([place])   . Emphysema lung ([place])   . Hepatitis C    s/p treatment with Epclusa  . History of HPV infection   . History of pneumonia   . Smoker     Past Surgical History:  Procedure Laterality Date  . BIOPSY  05/02/2018   Procedure: BIOPSY;  Surgeon: Daneil Dolin, MD;  [facility];  Service: Endoscopy;;  gastric  . CESAREAN SECTION    . COLONOSCOPY WITH PROPOFOL N/A 03/03/2019   Procedure: COLONOSCOPY WITH PROPOFOL;  Surgeon: Daneil Dolin, MD;  [facility];  Service: Endoscopy;  Laterality: N/A;  9:15am  . ESOPHAGOGASTRODUODENOSCOPY (EGD) WITH PROPOFOL N/A 05/02/2018   Procedure: ESOPHAGOGASTRODUODENOSCOPY (EGD) WITH PROPOFOL;  Surgeon: Daneil Dolin, MD;  [facility];  Service: Endoscopy;  Laterality: N/A;  8:30am  . HEMOSTASIS CLIP PLACEMENT  03/03/2019   Procedure: HEMOSTASIS CLIP PLACEMENT;  Surgeon: Daneil Dolin, MD;  [facility];  Service: Endoscopy;;  . IR US GUIDE [place] RIGHT  03/13/2018  . POLYPECTOMY  03/03/2019   Procedure: POLYPECTOMY;  Surgeon: Daneil Dolin, MD;  [facility];  Service: Endoscopy;;  . TOOTH EXTRACTION  01/31/2018   x 7    Family History  Problem Relation Age of Onset  . Congenital heart disease Mother   . Bipolar disorder Mother   . Prostate cancer Brother   . Alzheimer's disease Paternal Grandmother   . Colon cancer Neg Hx     Social History Social History   Tobacco Use  . Smoking status: Current Every Day Smoker    Packs/day: 0.50    Years: 38.00    Pack years: 19.00    Types: Cigarettes  . Smokeless tobacco: Never Used  Substance Use Topics  . Alcohol use: Not  Currently    Comment: Last use of alcohol 03/2016  . Drug use: Not Currently    Current Outpatient Medications  Medication Sig Dispense Refill  . albuterol (VENTOLIN HFA) 108 (90 Base) MCG/ACT inhaler Inhale 2 puffs into the lungs every 4 (four) hours as needed for wheezing or shortness of breath.     . diphenhydrAMINE (BENADRYL) 25 MG tablet Take 25 mg by mouth at bedtime.     . divalproex (DEPAKOTE) 250 MG DR tablet Take 250 mg by mouth at bedtime.     . pantoprazole (PROTONIX) 40 MG tablet Take one tablet before breakfast daily. (Patient taking differently: Take 40 mg by mouth daily before breakfast. ) 90 tablet 3  . risperiDONE (RISPERDAL) 2 MG tablet Take 2 mg by mouth at bedtime.     . SYMBICORT 160-4.5 MCG/ACT inhaler Inhale 2 puffs into the lungs 2 (two) times daily.     No current facility-administered medications for this visit.    Allergies  Allergen Reactions  . Penicillins Anaphylaxis    Throat swelled Did it involve swelling of the face/tongue/throat, SOB, or low BP? Yes Did it involve sudden or severe rash/hives, skin peeling, or any reaction on the inside of your mouth or nose? No Did you need to seek medical attention at a hospital or doctor's office? No When did it last happen?childhood allergy If all above answers are "NO", may proceed with cephalosporin use.   Marland Kitchen Amoxicillin     hallucinations Did it involve swelling of the face/tongue/throat, SOB, or low BP? No Did it involve sudden or severe rash/hives, skin peeling, or any reaction on the inside of your mouth or nose? No Did you need to seek medical attention at a hospital or doctor's office? Yes When did it last happen?July 2019 If all above answers are "NO", may proceed with cephalosporin use.   . Fish Allergy Nausea Only    Review of Systems  Constitutional: Negative for activity change, appetite change and unexpected weight change.  HENT: Positive for dental problem (dentures). Negative for  trouble swallowing and voice change.   Respiratory: Positive for shortness of breath and wheezing.   Cardiovascular: Negative for chest pain and leg swelling.  Gastrointestinal: Negative for abdominal distention.  Genitourinary: Negative for difficulty urinating and dysuria.  Musculoskeletal:       Leg cramps  Neurological: Negative for weakness.  Hematological: Negative for adenopathy. Does not bruise/bleed easily.  Psychiatric/Behavioral:  Positive for dysphoric mood. The patient is nervous/anxious.     BP 120/75 (BP Location: Left Arm, Patient Position: Sitting, Cuff Size: Normal)   Pulse 92   Temp 98.1 F (36.7 C) (Temporal)   Resp (!) 24   Ht 5\' 1"  (1.549 m)   Wt 182 lb (82.6 kg)   SpO2 94% Comment: RA  BMI 34.39 kg/m  Physical Exam Vitals reviewed.  Constitutional:      General: She is not in acute distress. HENT:     Head: Normocephalic and atraumatic.  Eyes:     General: No scleral icterus.    Extraocular Movements: Extraocular movements intact.  Cardiovascular:     Rate and Rhythm: Normal rate and regular rhythm.     Heart sounds: Normal heart sounds.  Pulmonary:     Effort: Pulmonary effort is normal. No respiratory distress.     Breath sounds: Wheezing present. No rales.  Abdominal:     General: Bowel sounds are normal. There is no distension.     Palpations: Abdomen is soft.     Tenderness: There is no abdominal tenderness.  Musculoskeletal:        General: No swelling.     Cervical back: Neck supple.  Lymphadenopathy:     Cervical: No cervical adenopathy.  Skin:    General: Skin is warm and dry.  Neurological:     General: No focal deficit present.     Mental Status: She is alert and oriented to person, place, and time.     Cranial Nerves: No cranial nerve deficit.     Motor: No weakness.    Diagnostic Tests: CT CHEST WITH CONTRAST  TECHNIQUE: Multidetector CT imaging of the chest was performed during intravenous contrast  administration.  CONTRAST:  90mL OMNIPAQUE IOHEXOL 300 MG/ML  SOLN  COMPARISON:  Abdomen/pelvis CT 03/13/2018.  FINDINGS: Cardiovascular: The heart size is normal. No substantial pericardial effusion. Coronary artery calcification is evident. Atherosclerotic calcification is noted in the wall of the thoracic aorta.  Mediastinum/Nodes: 15 mm short axis subcarinal lymph node is mildly enlarged. No other mediastinal lymphadenopathy evident There is no hilar lymphadenopathy. The esophagus has normal imaging features. There is no axillary lymphadenopathy.  Lungs/Pleura: Centrilobular and paraseptal emphysema evident. 7 mm right middle lobe perifissural nodule on 75/4 was not included on the prior study. This may well be a lymph node.  Interval progression of the right lower lobe pulmonary nodule measuring 1.4 x 1.3 cm today compared to 1.1 x 0.7 cm on the prior study.  6 mm left lower lobe nodule on 64/4 was not included on the prior study.  No focal airspace consolidation.  No pleural effusion.  Upper Abdomen: Nodular hepatic contour is compatible with cirrhosis. Recanalization of the paraumbilical vein noted. Otherwise unremarkable.  Musculoskeletal: No worrisome lytic or sclerotic osseous abnormality.  IMPRESSION: 1. Interval progression of the right lower lobe pulmonary nodule measuring up to 1.4 cm today compared to 1.1 cm on the prior study. Imaging features highly suspicious for primary or metastatic neoplasm. PET-CT may prove helpful to further evaluate. 2. 7 mm right middle lobe perifissural nodule has a triangular configuration and may well be a subpleural lymph node. The 6 mm left lower lobe pulmonary nodule has a more rounded configuration and close attention on follow-up recommended. 3. Cirrhosis with recanalized paraumbilical vein suggesting portal venous hypertension. 4. Aortic Atherosclerosis (ICD10-I70.0) and Emphysema (ICD10-J43.9).  These  results will be called to the ordering clinician or representative by the Radiologist Assistant,  and communication documented in the PACS or [employer].   Electronically Signed   By: Misty Stanley M.D.   On: 05/01/2019 13:35 NUCLEAR MEDICINE PET SKULL BASE TO THIGH  TECHNIQUE: 9.2 mCi F-18 FDG was injected intravenously. Full-ring PET imaging was performed from the skull base to thigh after the radiotracer. CT data was obtained and used for attenuation correction and anatomic localization.  Fasting blood glucose: 79 mg/dl  COMPARISON:  CT 05/01/2019, 06/05/2018  FINDINGS: Mediastinal blood pool activity: SUV max 1.7  Liver activity: SUV max NA  NECK: No hypermetabolic lymph nodes in the neck.  Incidental CT findings: none  CHEST: 13 mm nodule in the RIGHT lower lobe with associated metabolic activity with SUV max equal 4.0 (image 111/3). This corresponds to the enlarging nodule on comparison chest CT.  Smaller nodule in the LEFT lower lobe measuring 6 mm (image 90/3) does not have associated metabolic activity but is below the size limit for accurate characterization  Probable inter fissural lymph node in the RIGHT middle lobe (image 10/9) has no metabolic activity.  No hypermetabolic mediastinal lymph nodes or enlarged mediastinal.  Incidental CT findings: none  ABDOMEN/PELVIS: No abnormal hypermetabolic activity within the liver, pancreas, adrenal glands, or spleen. No hypermetabolic lymph nodes in the abdomen or pelvis.  Incidental CT findings: Nodular contour of the liver. Uterus normal. Atherosclerotic calcification of the aorta.  SKELETON: No focal hypermetabolic activity to suggest skeletal metastasis.  Incidental CT findings: none  IMPRESSION: 1. Enlarging hypermetabolic RIGHT lobe nodule is concerning for bronchogenic carcinoma. No hypermetabolic mediastinal lymph nodes. Favor stage T1a N0 M0. 2. A 6 mm LEFT lobe pulmonary  nodule too small to characterize by FDG PET scan. Cannot exclude synchronous bronchogenic carcinoma. Recommend follow-up CT scan 6 months. 3. Cirrhotic liver.   Electronically Signed   By: Suzy Bouchard M.D.   On: 05/13/2019 18:51  I personally reviewed the CT and PET/CT images and concur with the findings noted above  Pulmonary function testing 05/27/2019 FVC 2.18 (70%) FEV1 1.56 (64%) FEV1 1.65 (68%) postbronchodilator DLCO 14.37 (77%) uncorrected  Impression: Rachel Gross is a 54 year old woman with a past medical history significant for tobacco abuse, COPD, ethanol abuse, hepatitis C (treated), cirrhosis, portal hypertensive gastropathy, thrombocytopenia and atypical squamous cell of the cervix.  She had an incidental right lower lobe lung nodule seen on a CT of the abdomen a little over a year ago.  Her recent chest CT showed the nodule was significantly larger.  On PET CT the nodule is hypermetabolic with an SUV of 4.   I reviewed the CT and PET/CT images with Rachel Gross and her father.  We discussed the differential diagnosis.  Given her age, tobacco abuse, growing nodule, and metabolic activity on PET, this has to be considered a new primary bronchogenic carcinoma unless it can be proven otherwise.  Carcinoid tumor is also within the differential diagnosis but the treatment would be the same.  Other etiologies such as infectious and inflammatory nodules are far less likely given the morphology of the lesion.  We discussed possible options for work-up and treatment.  We discussed possibility of doing a bronchoscopic biopsy.  The lesion is too central for a CT-guided biopsy.  Bronchoscopic biopsy was positive surgery would be recommended.  If it was negative I would not be comfortable watching this nodule given the findings and would still recommend surgery.  Therefore, I did not recommend bronchoscopy prior to resection.  I recommended that we proceed with robotic  assisted  right lower lobectomy for definitive diagnosis and treatment.  I informed them of the general nature of the procedure including the need for general anesthesia, the incisions to be used, the use of drains to postoperatively, the expected hospital stay, and the overall recovery.  I informed them of the indications, risks, benefits, and alternatives.  They understand the risks include, but are not limited to death, MI, DVT, PE, bleeding, possible need for transfusion, infection, prolonged air leak, cardiac arrhythmias, as well as possibility of other unforeseeable complications including chronic pain.  She understands and accepts the risks and wishes to proceed.  Plan: Robotic assisted right lower lobectomy on Thursday, 06/19/2019  [name], MD [facility] [phone]

## 2019-06-11 ENCOUNTER — Ambulatory Visit (INDEPENDENT_AMBULATORY_CARE_PROVIDER_SITE_OTHER): Payer: Medicaid Other | Admitting: Nurse Practitioner

## 2019-06-11 ENCOUNTER — Other Ambulatory Visit: Payer: Self-pay

## 2019-06-11 ENCOUNTER — Encounter: Payer: Self-pay | Admitting: Nurse Practitioner

## 2019-06-11 VITALS — BP 120/74 | HR 83 | Temp 97.1°F | Ht 61.0 in | Wt 183.2 lb

## 2019-06-11 DIAGNOSIS — K7460 Unspecified cirrhosis of liver: Secondary | ICD-10-CM

## 2019-06-11 DIAGNOSIS — R195 Other fecal abnormalities: Secondary | ICD-10-CM | POA: Diagnosis not present

## 2019-06-11 DIAGNOSIS — D696 Thrombocytopenia, unspecified: Secondary | ICD-10-CM

## 2019-06-11 NOTE — Assessment & Plan Note (Signed)
Noted thrombocytopenia as a likely sequela of cirrhosis.  Baseline is 75-100.  Most recent CBC found improvement and platelet count currently 142.  Update labs as per above.  Follow-up in 6 months.

## 2019-06-11 NOTE — Progress Notes (Signed)
Referring Provider: Celene Squibb, MD Primary Care Physician:  Celene Squibb, MD Primary GI:  Dr. Gala Romney  Chief Complaint  Patient presents with  . Cirrhosis    f/u.  . + cologuard     no bleeding that she has seen    HPI:   Rachel Gross is a 54 y.o. female who presents on referral from primary care for positive Cologuard.  Also for known cirrhosis.  The patient was last seen in our office 12/05/2018 for cirrhosis and positive Cologuard.  At that time it was noted the patient was referred to the liver clinic in [facility] for treatment of hepatitis C in the setting of complicated cirrhosis and previous decompensation.  She was treated with Epclusa.  Previous meld score 8, child Pugh class A.  Recommended EGD by local GI.  EGD is up-to-date for 03/21/2020 with mild erosive reflux esophagitis, portal hypertensive gastropathy, erosive gastropathy status post biopsy, normal duodenum and no esophageal varices noted.  Surgical pathology, biopsy to be antrum and slight chronic inflammation, negative for H. pylori.  Recommended continue current medications, start Protonix 40 mg daily and repeat EGD in 2 years (2022) for variceal screening purposes.  At a previous primary care office visit note for annual wellness visit Cologuard test was ordered and came back positive.  At her last visit she noted no previous colonoscopy, no longer seeing the liver clinic and was recommended to follow-up with local GI for chronic cirrhosis care.  No overt GI complaints.  Recommended colonoscopy, follow-up in 3 months for updated liver care.  Colonoscopy completed 03/03/2019 which found diverticulosis, three 4 to 6 mm polyps in the rectum and hepatic flexure, a single 15 mm polyp in the cecum status post retrieval and clips x2, otherwise normal.  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  Recommended repeat colonoscopy in 3 years (2024).  Also recommended follow-up in office in 3 months for routine liver  care.  Today she states she's doing ok overall. Has upcoming thoroscopy and lower lobectomy for enlarging pulmonary nodule, hypermetabolic on PET/CT imaging. She is a little worried, but not overly anxious. Denies abdominal pain, N/V, hematochezia, melena, fever, chills, unintentional weight loss. Denies yellowing of skin/eyes, darkened urine, acute episodic confusion, generalized pruritis, tremors/shakes. Denies URI or flu-like symptoms. Denies loss of sense of taste or smell. The patient has received COVID-19 vaccination(s). She does have some dyspnea and wheezing currently at baseline. Denies chest pain, dizziness, lightheadedness, syncope, near syncope. Denies any other upper or lower GI symptoms.  She is on her last pack of cigarettes and plans to quit when it's done and not buy any more.  RUQ U/S is up to date.  Past Medical History:  Diagnosis Date  . Alcoholic hepatitis without ascites   . 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 COPD (chronic obstructive pulmonary disease) ([place])   . Emphysema lung ([place])   . Hepatitis C    s/p treatment with Epclusa  . History of HPV infection   . History of pneumonia   . Smoker     Past Surgical History:  Procedure Laterality Date  . BIOPSY  05/02/2018   Procedure: BIOPSY;  Surgeon: Daneil Dolin, MD;  [facility];  Service: Endoscopy;;  gastric  . CESAREAN SECTION    . COLONOSCOPY WITH PROPOFOL N/A 03/03/2019   Procedure: COLONOSCOPY WITH PROPOFOL;  Surgeon: Daneil Dolin, MD;  [facility];  Service:  Endoscopy;  Laterality: N/A;  9:15am  . ESOPHAGOGASTRODUODENOSCOPY (EGD) WITH PROPOFOL N/A 05/02/2018   Procedure: ESOPHAGOGASTRODUODENOSCOPY (EGD) WITH PROPOFOL;  Surgeon: Daneil Dolin, MD;  [facility];  Service: Endoscopy;  Laterality: N/A;  8:30am  . HEMOSTASIS CLIP PLACEMENT  03/03/2019   Procedure: HEMOSTASIS CLIP PLACEMENT;  Surgeon: Daneil Dolin, MD;  [facility];   Service: Endoscopy;;  . IR US GUIDE [place] RIGHT  03/13/2018  . POLYPECTOMY  03/03/2019   Procedure: POLYPECTOMY;  Surgeon: Daneil Dolin, MD;  [facility];  Service: Endoscopy;;  . TOOTH EXTRACTION  01/31/2018   x 7    Current Outpatient Medications  Medication Sig Dispense Refill  . albuterol (VENTOLIN HFA) 108 (90 Base) MCG/ACT inhaler Inhale 2 puffs into the lungs every 4 (four) hours as needed for wheezing or shortness of breath.     . diphenhydrAMINE (BENADRYL) 25 MG tablet Take 25 mg by mouth at bedtime.     . divalproex (DEPAKOTE) 250 MG DR tablet Take 250 mg by mouth at bedtime.     . pantoprazole (PROTONIX) 40 MG tablet Take one tablet before breakfast daily. (Patient taking differently: Take 40 mg by mouth daily before breakfast. ) 90 tablet 3  . risperiDONE (RISPERDAL) 2 MG tablet Take 2 mg by mouth at bedtime.     . SYMBICORT 160-4.5 MCG/ACT inhaler Inhale 2 puffs into the lungs 2 (two) times daily.     No current facility-administered medications for this visit.    Allergies as of 06/11/2019 - Review Complete 06/11/2019  Allergen Reaction Noted  . Penicillins Anaphylaxis 09/10/2017  . Amoxicillin  09/10/2017  . Fish allergy Nausea Only 05/24/2017    Family History  Problem Relation Age of Onset  . Congenital heart disease Mother   . Bipolar disorder Mother   . Prostate cancer Brother   . Alzheimer's disease Paternal Grandmother   . Colon cancer Neg Hx     Social History   Socioeconomic History  . Marital status: Single    Spouse name: Not on file  . Number of children: Not on file  . Years of education: Not on file  . Highest education level: Not on file  Occupational History  . Not on file  Tobacco Use  . Smoking status: Current Every Day Smoker    Packs/day: 0.50    Years: 38.00    Pack years: 19.00    Types: Cigarettes  . Smokeless tobacco: Never Used  Substance and Sexual Activity  . Alcohol use: Not Currently    Comment: Last  use of alcohol 03/2016  . Drug use: Not Currently  . Sexual activity: Not on file  Other Topics Concern  . Not on file  Social History Narrative  . Not on file   Social Determinants of Health   Financial Resource Strain:   . Difficulty of Paying Living Expenses:   Food Insecurity:   . Worried About Charity fundraiser in the Last Year:   . Arboriculturist in the Last Year:   Transportation Needs:   . Film/video editor (Medical):   Marland Kitchen Lack of Transportation (Non-Medical):   Physical Activity:   . Days of Exercise per Week:   . Minutes of Exercise per Session:   Stress:   . Feeling of Stress :   Social Connections:   . Frequency of Communication with Friends and Family:   . Frequency of Social Gatherings with Friends and Family:   .  Attends Religious Services:   . Active Member of Clubs or Organizations:   . Attends Archivist Meetings:   Marland Kitchen Marital Status:     Subjective: Review of Systems  Constitutional: Negative for chills, fever, malaise/fatigue and weight loss.  HENT: Negative for congestion and sore throat.   Respiratory: Positive for shortness of breath and wheezing. Negative for cough.   Cardiovascular: Negative for chest pain and palpitations.  Gastrointestinal: Negative for abdominal pain, blood in stool, diarrhea, melena, nausea and vomiting.  Musculoskeletal: Negative for joint pain and myalgias.  Skin: Negative for rash.  Neurological: Negative for dizziness and weakness.  Endo/Heme/Allergies: Does not bruise/bleed easily.  Psychiatric/Behavioral: Negative for depression. The patient is not nervous/anxious.   All other systems reviewed and are negative.    Objective: BP 120/74   Pulse 83   Temp (!) 97.1 F (36.2 C) (Oral)   Ht 5\' 1"  (1.549 m)   Wt 183 lb 3.2 oz (83.1 kg)   BMI 34.62 kg/m  Physical Exam Vitals and nursing note reviewed.  Constitutional:      General: She is not in acute distress.    Appearance: Normal appearance. She  is well-developed. She is obese. She is not ill-appearing, toxic-appearing or diaphoretic.  HENT:     Head: Normocephalic and atraumatic.     Nose: No congestion or rhinorrhea.  Eyes:     General: No scleral icterus. Cardiovascular:     Rate and Rhythm: Normal rate and regular rhythm.     Heart sounds: Normal heart sounds.  Pulmonary:     Effort: Pulmonary effort is normal. No respiratory distress.     Breath sounds: Examination of the right-lower field reveals wheezing. Examination of the left-lower field reveals wheezing. Wheezing present.     Comments: Audible wheezing Abdominal:     General: Bowel sounds are normal. There is no distension.     Palpations: Abdomen is soft. There is no shifting dullness, fluid wave, hepatomegaly, splenomegaly or mass.     Tenderness: There is no abdominal tenderness. There is no guarding or rebound.     Hernia: No hernia is present.  Musculoskeletal:     Right lower leg: No edema.     Left lower leg: No edema.  Skin:    General: Skin is warm and dry.     Coloration: Skin is not jaundiced.     Findings: No rash.  Neurological:     General: No focal deficit present.     Mental Status: She is alert and oriented to person, place, and time.  Psychiatric:        Attention and Perception: Attention normal.        Mood and Affect: Mood normal.        Speech: Speech normal.        Behavior: Behavior normal.        Thought Content: Thought content normal.        Cognition and Memory: Cognition and memory normal.       06/11/2019 2:34 PM   Disclaimer: This note was dictated with voice recognition software. Similar sounding words can inadvertently be transcribed and may not be corrected upon review.

## 2019-06-11 NOTE — Assessment & Plan Note (Signed)
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  Historically her liver disease has been well compensated with a meld of 8 most recently.  At this point she is due for updated liver labs.  Right upper quadrant ultrasound was completed about 2 months ago and obvious liver lesion.  EGD for variceal screening up-to-date.  No overt hepatic or GI symptoms today.  Update labs, follow-up in 6 months.

## 2019-06-11 NOTE — Patient Instructions (Signed)
Your health issues we discussed today were:   Cirrhosis due to hepatitis C (now treated/eradicated): 1. Have your labs completed when you are able to 2. We will call you with results 3. Call us if you have any concerning symptoms such as yellowing of your skin or eyes, brown urine, worsening confusion  Previously positive Cologuard: 1. As we discussed, your colonoscopy found a few polyps.  We recommend repeating your colonoscopy in 3 years (2024). 2. We will put you on a recall list to remind you to follow-up for your updated colonoscopy when it is 3. Call us if you have any questions about this or if you see any obvious bleeding  Overall I recommend:  1. Continue your other current medications 2. Return for follow-up in 6 months 3. Call us if you have any questions or concerns   ---------------------------------------------------------------  I am glad you have gotten your COVID-19 vaccination!  Even though you are fully vaccinated you should continue to wear a mask, socially distance, and wash your hands frequently.  ---------------------------------------------------------------   At [facility] we value your feedback. You may receive a survey about your visit today. Please share your experience as we strive to create trusting relationships with our patients to provide genuine, compassionate, quality care.  We appreciate your understanding and patience as we review any laboratory studies, imaging, and other diagnostic tests that are ordered as we care for you. Our office policy is 5 business days for review of these results, and any emergent or urgent results are addressed in a timely manner for your best interest. If you do not hear from our office in 1 week, please contact us.   We also encourage the use of MyChart, which contains your medical information for your review as well. If you are not enrolled in this feature, an access code is on this after visit summary  for your convenience. Thank you for allowing [place] to be involved in your care.  It was great to see you today!  I hope you have a great Summer and GOOD LUCK WITH SURGERY!!!!!!!

## 2019-06-11 NOTE — Assessment & Plan Note (Signed)
Previously noted positive Cologuard test.  Colonoscopy was updated and found a 15 mm polyp and a couple smaller polyps.  These were tubular adenoma and recommended repeat in 3 years (2024).  Denies any hematochezia at this point.  No concerning symptoms related to her colon.  Recommended she continue current meds, follow-up with any concerning symptoms, repeat colonoscopy as suggested interval.

## 2019-06-16 NOTE — Progress Notes (Signed)
Your procedure is scheduled on Thursday May 20.  Report to [facility] Main Entrance "A" at 06:00 A.M., and check in at the Admitting office.  Call this number if you have problems the morning of surgery: [phone]  Call [phone] if you have any questions prior to your surgery date Monday-Friday 8am-4pm   Remember: Do not eat or drink after midnight the night before your surgery   Take these medicines the morning of surgery with A SIP OF WATER: pantoprazole (PROTONIX) SYMBICORT 160-4.5 MCG/ACT inhaler  If needed:  albuterol (VENTOLIN HFA) inhaler ---- Please bring all inhalers with you the day of surgery.    As of today, STOP taking any Aspirin (unless otherwise instructed by your surgeon), Aleve, Naproxen, Ibuprofen, Motrin, Advil, Goody's, BC's, all herbal medications, fish oil, and all vitamins.    The Morning of Surgery  Do not wear jewelry, make-up or nail polish.  Do not wear lotions, powders, or perfumes, or deodorant  Do not shave 48 hours prior to surgery.    Do not bring valuables to the hospital.  [facility] is not responsible for any belongings or valuables.  If you are a smoker, DO NOT Smoke 24 hours prior to surgery  If you wear a CPAP at night please bring your mask the morning of surgery   Remember that you must have someone to transport you home after your surgery, and remain with you for 24 hours if you are discharged the same day.   Please bring cases for contacts, glasses, hearing aids, dentures or bridgework because it cannot be worn into surgery.    Leave your suitcase in the car.  After surgery it may be brought to your room.  For patients admitted to the hospital, discharge time will be determined by your treatment team.  Patients discharged the day of surgery will not be allowed to drive home.    Special instructions:   [place]- Preparing For Surgery  Before surgery, you can play an important role. Because skin is not sterile,  your skin needs to be as free of germs as possible. You can reduce the number of germs on your skin by washing with CHG (chlorahexidine gluconate) Soap before surgery.  CHG is an antiseptic cleaner which kills germs and bonds with the skin to continue killing germs even after washing.    Oral Hygiene is also important to reduce your risk of infection.  Remember - BRUSH YOUR TEETH THE MORNING OF SURGERY WITH YOUR REGULAR TOOTHPASTE  Please do not use if you have an allergy to CHG or antibacterial soaps. If your skin becomes reddened/irritated stop using the CHG.  Do not shave (including legs and underarms) for at least 48 hours prior to first CHG shower. It is OK to shave your face.  Please follow these instructions carefully.   1. Shower the NIGHT BEFORE SURGERY and the MORNING OF SURGERY with CHG Soap.   2. If you chose to wash your hair and body, wash as usual with your normal shampoo and body-wash/soap.  3. Rinse your hair and body thoroughly to remove the shampoo and soap.  4. Apply CHG directly to the skin (ONLY FROM THE NECK DOWN) and wash gently with a scrungie or a clean washcloth.   5. Do not use on open wounds or open sores. Avoid contact with your eyes, ears, mouth and genitals (private parts). Wash Face and genitals (private parts)  with your normal soap.   6. Wash thoroughly, paying special  attention to the area where your surgery will be performed.  7. Thoroughly rinse your body with warm water from the neck down.  8. DO NOT shower/wash with your normal soap after using and rinsing off the CHG Soap.  9. Pat yourself dry with a CLEAN TOWEL.  10. Wear CLEAN PAJAMAS to bed the night before surgery  11. Place CLEAN SHEETS on your bed the night of your first shower and DO NOT SLEEP WITH PETS.  12. Wear comfortable clothes the morning of surgery.     Day of Surgery:  Please shower the morning of surgery with the CHG soap Do not apply any deodorants/lotions. Please wear  clean clothes to the hospital/surgery center.   Remember to brush your teeth WITH YOUR REGULAR TOOTHPASTE.   Please read over the following fact sheets that you were given.

## 2019-06-17 ENCOUNTER — Other Ambulatory Visit (HOSPITAL_COMMUNITY)
Admission: RE | Admit: 2019-06-17 | Discharge: 2019-06-17 | Disposition: A | Discharge status: Home / Self Care | Payer: Medicaid Other | Source: Ambulatory Visit | Attending: Thoracic Surgery (Cardiothoracic Vascular Surgery) | Admitting: Thoracic Surgery (Cardiothoracic Vascular Surgery)

## 2019-06-17 ENCOUNTER — Other Ambulatory Visit: Payer: Self-pay

## 2019-06-17 ENCOUNTER — Ambulatory Visit (HOSPITAL_COMMUNITY)
Admission: RE | Admit: 2019-06-17 | Discharge: 2019-06-17 | Disposition: A | Discharge status: Home / Self Care | Payer: Medicaid Other | Source: Ambulatory Visit | Attending: Thoracic Surgery (Cardiothoracic Vascular Surgery) | Admitting: Thoracic Surgery (Cardiothoracic Vascular Surgery)

## 2019-06-17 ENCOUNTER — Encounter (HOSPITAL_COMMUNITY)
Admission: RE | Admit: 2019-06-17 | Discharge: 2019-06-17 | Disposition: A | Discharge status: Home / Self Care | Payer: Medicaid Other | Source: Ambulatory Visit | Attending: Thoracic Surgery (Cardiothoracic Vascular Surgery) | Admitting: Thoracic Surgery (Cardiothoracic Vascular Surgery)

## 2019-06-17 ENCOUNTER — Encounter (HOSPITAL_COMMUNITY): Payer: Self-pay

## 2019-06-17 DIAGNOSIS — Z20822 Contact with and (suspected) exposure to covid-19: Secondary | ICD-10-CM | POA: Insufficient documentation

## 2019-06-17 DIAGNOSIS — Z01818 Encounter for other preprocedural examination: Secondary | ICD-10-CM | POA: Diagnosis present

## 2019-06-17 DIAGNOSIS — R911 Solitary pulmonary nodule: Secondary | ICD-10-CM

## 2019-06-17 HISTORY — DX: Depression, unspecified: F32.A

## 2019-06-17 HISTORY — DX: Anxiety disorder, unspecified: F41.9

## 2019-06-17 HISTORY — DX: Gastro-esophageal reflux disease without esophagitis: K21.9

## 2019-06-17 HISTORY — DX: Pneumonia, unspecified organism: J18.9

## 2019-06-17 HISTORY — DX: Malignant (primary) neoplasm, unspecified: C80.1

## 2019-06-17 HISTORY — DX: Unspecified osteoarthritis, unspecified site: M19.90

## 2019-06-17 HISTORY — DX: Bipolar disorder, unspecified: F31.9

## 2019-06-17 LAB — COMPREHENSIVE METABOLIC PANEL
ALT: 24 U/L (ref 0–44)
AST: 30 U/L (ref 15–41)
Albumin: 4.5 g/dL (ref 3.5–5.0)
Alkaline Phosphatase: 72 U/L (ref 38–126)
Anion gap: 14 (ref 5–15)
BUN: 5 mg/dL — ABNORMAL LOW (ref 6–20)
CO2: 24 mmol/L (ref 22–32)
Calcium: 9.6 mg/dL (ref 8.9–10.3)
Chloride: 101 mmol/L (ref 98–111)
Creatinine, Ser: 0.66 mg/dL (ref 0.44–1.00)
GFR calc Af Amer: >60 mL/min (ref >60)
GFR calc non Af Amer: >60 mL/min (ref >60)
Glucose, Bld: 90 mg/dL (ref 70–99)
Potassium: 3.5 mmol/L (ref 3.5–5.1)
Sodium: 139 mmol/L (ref 135–145)
Total Bilirubin: 0.7 mg/dL (ref 0.3–1.2)
Total Protein: 8.3 g/dL — ABNORMAL HIGH (ref 6.5–8.1)

## 2019-06-17 LAB — PROTIME-INR
INR: 1 (ref 0.8–1.2)
Prothrombin Time: 12.6 seconds (ref 11.4–15.2)

## 2019-06-17 LAB — CBC
HCT: 45.8 % (ref 36.0–46.0)
Hemoglobin: 15.1 g/dL — ABNORMAL HIGH (ref 12.0–15.0)
MCH: 30.4 pg (ref 26.0–34.0)
MCHC: 33 g/dL (ref 30.0–36.0)
MCV: 92.3 fL (ref 80.0–100.0)
Platelets: 162 10*3/uL (ref 150–400)
RBC: 4.96 MIL/uL (ref 3.87–5.11)
RDW: 13.2 % (ref 11.5–15.5)
WBC: 11.6 10*3/uL — ABNORMAL HIGH (ref 4.0–10.5)
nRBC: 0 % (ref 0.0–0.2)

## 2019-06-17 LAB — SURGICAL PCR SCREEN
MRSA, PCR: NEGATIVE
Staphylococcus aureus: NEGATIVE

## 2019-06-17 LAB — SARS CORONAVIRUS 2 (TAT 6-24 HRS): SARS Coronavirus 2: NEGATIVE

## 2019-06-17 LAB — APTT: aPTT: 27 seconds (ref 24–36)

## 2019-06-17 LAB — ABO/RH: ABO/RH(D): AB POS

## 2019-06-17 NOTE — Progress Notes (Signed)
Patient unable to provide urine sample during PAT visit. Will collect DOS 06/19/19.

## 2019-06-17 NOTE — Progress Notes (Signed)
Patient refused to get ABG done during PAT appointment. Will obtain DOS when Arterial-line placed.

## 2019-06-17 NOTE — Progress Notes (Signed)
PCP - Edwinna Areola. [place] Crane, MD Cardiologist - Denies  PPM/ICD - Denies  Chest x-ray - 06/17/19 EKG - 06/17/19 Stress Test - Denies ECHO - Denies Cardiac Cath - Denies  Sleep Study - Denies  Patient denies being diabetic.  Blood Thinner Instructions: N/A Aspirin Instructions: N/A  ERAS Protcol - N/A PRE-SURGERY Ensure or G2- N/A  COVID TEST- 06/17/19   Anesthesia review: Yes, pulmonary hx.  Patient denies shortness of breath, fever, cough and chest pain at PAT appointment   All instructions explained to the patient, with a verbal understanding of the material. Patient agrees to go over the instructions while at home for a better understanding. Patient also instructed to self quarantine after being tested for COVID-19. The opportunity to ask questions was provided.

## 2019-06-18 NOTE — Anesthesia Preprocedure Evaluation (Addendum)
Anesthesia Evaluation  Patient identified by MRN, date of birth, ID band Patient awake    Reviewed: Allergy & Precautions, NPO status , Patient's Chart, lab work & pertinent test results  Airway Mallampati: III  TM Distance: >3 FB Neck ROM: Full    Dental  (+) Dental Advisory Given, Partial Upper   Pulmonary COPD,  COPD inhaler, Current Smoker,  RLL NODULE   Pulmonary exam normal breath sounds clear to auscultation       Cardiovascular hypertension, Normal cardiovascular exam Rhythm:Regular Rate:Normal     Neuro/Psych PSYCHIATRIC DISORDERS Anxiety Depression Bipolar Disorder negative neurological ROS     GI/Hepatic GERD  Medicated,(+) Hepatitis -, C  Endo/Other  Obesity   Renal/GU negative Renal ROS     Musculoskeletal  (+) Arthritis ,   Abdominal   Peds  Hematology negative hematology ROS (+)   Anesthesia Other Findings   Reproductive/Obstetrics                          Anesthesia Physical Anesthesia Plan  ASA: III  Anesthesia Plan: General   Post-op Pain Management:    Induction: Intravenous  PONV Risk Score and Plan: 3 and Midazolam, Dexamethasone, Ondansetron and Scopolamine patch - Pre-op  Airway Management Planned: Double Lumen EBT  Additional Equipment: Arterial line, CVP and Ultrasound Guidance Line Placement  Intra-op Plan:   Post-operative Plan: Extubation in OR  Informed Consent: I have reviewed the patients History and Physical, chart, labs and discussed the procedure including the risks, benefits and alternatives for the proposed anesthesia with the patient or authorized representative who has indicated his/her understanding and acceptance.     Dental advisory given  Plan Discussed with: CRNA  Anesthesia Plan Comments: (PAT note by Karoline Caldwell, PA-C: Hx of hepatic cirrhosis, hep C s/p treatment, portal hypertensive gastropathy, thrombocytopenia followed by  GI. 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  Historically her liver disease has been well compensated with a meld of 8 most recently.  At this point she is due for updated liver labs.  Right upper quadrant ultrasound was completed about 2 months ago and obvious liver lesion.  EGD for variceal screening up-to-date.  No overt hepatic or GI symptoms today.  Update labs, follow-up in 6 months"  CT of the abdomen in February 2020  showed a 1.1 x 0.9 x 0.7 cm right lower lobe pulmonary nodule.  She had a chest CT in April 2021 which showed the nodule increased in size to 1.4 x 1.3 cm.  There was no adenopathy.  PET/CT showed the lower lobe nodule was hypermetabolic with an SUV of 4.  No evidence of regional or distant metastases.  Preop labs reviewed, unremarkable.   EKG 06/17/19: Normal sinus rhythm. Rate 75. Cannot rule out Anterior infarct , age undetermined. No significant change since 2019  PFTs 05/17/19: FVC-%Pred-Pre Latest Units: % 70 FEV1-%Pred-Pre Latest Units: % 64 FEV1FVC-%Pred-Pre Latest Units: % 90 DLCO unc % pred Latest Units: % 77  )      Anesthesia Quick Evaluation

## 2019-06-18 NOTE — Progress Notes (Signed)
Anesthesia Chart Review:  Hx of hepatic cirrhosis, hep C s/p treatment, portal hypertensive gastropathy, thrombocytopenia followed by GI. 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  Historically her liver disease has been well compensated with a meld of 8 most recently.  At this point she is due for updated liver labs.  Right upper quadrant ultrasound was completed about 2 months ago and obvious liver lesion.  EGD for variceal screening up-to-date.  No overt hepatic or GI symptoms today.  Update labs, follow-up in 6 months"  CT of the abdomen in February 2020  showed a 1.1 x 0.9 x 0.7 cm right lower lobe pulmonary nodule.  She had a chest CT in April 2021 which showed the nodule increased in size to 1.4 x 1.3 cm.  There was no adenopathy.  PET/CT showed the lower lobe nodule was hypermetabolic with an SUV of 4.  No evidence of regional or distant metastases.  Preop labs reviewed, unremarkable.   EKG 06/17/19: Normal sinus rhythm. Rate 75. Cannot rule out Anterior infarct , age undetermined. No significant change since 2019  PFTs 05/17/19: FVC-%Pred-Pre Latest Units: % 70  FEV1-%Pred-Pre Latest Units: % 64  FEV1FVC-%Pred-Pre Latest Units: % 90  DLCO unc % pred Latest Units: % [address]    Rachel Gross [facility] Short Stay Center/Anesthesiology Phone [phone] 06/18/2019 9:59 AM

## 2019-06-19 ENCOUNTER — Inpatient Hospital Stay (HOSPITAL_COMMUNITY): Payer: Medicaid Other

## 2019-06-19 ENCOUNTER — Encounter (HOSPITAL_COMMUNITY): Payer: Self-pay | Admitting: Thoracic Surgery (Cardiothoracic Vascular Surgery)

## 2019-06-19 ENCOUNTER — Other Ambulatory Visit: Payer: Self-pay

## 2019-06-19 ENCOUNTER — Inpatient Hospital Stay (HOSPITAL_COMMUNITY): Payer: Medicaid Other | Admitting: Physician Assistant

## 2019-06-19 ENCOUNTER — Inpatient Hospital Stay (HOSPITAL_COMMUNITY)
Admission: RE | Admit: 2019-06-19 | Discharge: 2019-07-02 | DRG: 164 | Disposition: A | Discharge status: Home / Self Care | Payer: Medicaid Other | Attending: Thoracic Surgery (Cardiothoracic Vascular Surgery) | Admitting: Thoracic Surgery (Cardiothoracic Vascular Surgery)

## 2019-06-19 ENCOUNTER — Encounter (HOSPITAL_COMMUNITY)
Admission: RE | Disposition: A | Discharge status: Home / Self Care | Payer: Self-pay | Source: Home / Self Care | Attending: Thoracic Surgery (Cardiothoracic Vascular Surgery)

## 2019-06-19 DIAGNOSIS — Z7951 Long term (current) use of inhaled steroids: Secondary | ICD-10-CM

## 2019-06-19 DIAGNOSIS — K3189 Other diseases of stomach and duodenum: Secondary | ICD-10-CM | POA: Diagnosis present

## 2019-06-19 DIAGNOSIS — Z91013 Allergy to seafood: Secondary | ICD-10-CM | POA: Diagnosis not present

## 2019-06-19 DIAGNOSIS — Z79899 Other long term (current) drug therapy: Secondary | ICD-10-CM | POA: Diagnosis not present

## 2019-06-19 DIAGNOSIS — F1010 Alcohol abuse, uncomplicated: Secondary | ICD-10-CM | POA: Diagnosis present

## 2019-06-19 DIAGNOSIS — J449 Chronic obstructive pulmonary disease, unspecified: Secondary | ICD-10-CM | POA: Diagnosis present

## 2019-06-19 DIAGNOSIS — I2510 Atherosclerotic heart disease of native coronary artery without angina pectoris: Secondary | ICD-10-CM | POA: Diagnosis present

## 2019-06-19 DIAGNOSIS — I700 Atherosclerosis of aorta: Secondary | ICD-10-CM | POA: Diagnosis present

## 2019-06-19 DIAGNOSIS — R630 Anorexia: Secondary | ICD-10-CM | POA: Diagnosis not present

## 2019-06-19 DIAGNOSIS — Z6834 Body mass index (BMI) 34.0-34.9, adult: Secondary | ICD-10-CM | POA: Diagnosis not present

## 2019-06-19 DIAGNOSIS — D696 Thrombocytopenia, unspecified: Secondary | ICD-10-CM | POA: Diagnosis present

## 2019-06-19 DIAGNOSIS — Z86010 Personal history of colonic polyps: Secondary | ICD-10-CM

## 2019-06-19 DIAGNOSIS — K766 Portal hypertension: Secondary | ICD-10-CM | POA: Diagnosis present

## 2019-06-19 DIAGNOSIS — R0602 Shortness of breath: Secondary | ICD-10-CM

## 2019-06-19 DIAGNOSIS — R911 Solitary pulmonary nodule: Secondary | ICD-10-CM

## 2019-06-19 DIAGNOSIS — J939 Pneumothorax, unspecified: Secondary | ICD-10-CM

## 2019-06-19 DIAGNOSIS — K660 Peritoneal adhesions (postprocedural) (postinfection): Secondary | ICD-10-CM | POA: Diagnosis present

## 2019-06-19 DIAGNOSIS — Z9689 Presence of other specified functional implants: Secondary | ICD-10-CM

## 2019-06-19 DIAGNOSIS — Z880 Allergy status to penicillin: Secondary | ICD-10-CM | POA: Diagnosis not present

## 2019-06-19 DIAGNOSIS — Y92239 Unspecified place in hospital as the place of occurrence of the external cause: Secondary | ICD-10-CM | POA: Diagnosis not present

## 2019-06-19 DIAGNOSIS — Z09 Encounter for follow-up examination after completed treatment for conditions other than malignant neoplasm: Secondary | ICD-10-CM

## 2019-06-19 DIAGNOSIS — K7460 Unspecified cirrhosis of liver: Secondary | ICD-10-CM | POA: Diagnosis present

## 2019-06-19 DIAGNOSIS — C3431 Malignant neoplasm of lower lobe, right bronchus or lung: Secondary | ICD-10-CM | POA: Diagnosis present

## 2019-06-19 DIAGNOSIS — C3491 Malignant neoplasm of unspecified part of right bronchus or lung: Secondary | ICD-10-CM

## 2019-06-19 DIAGNOSIS — B1920 Unspecified viral hepatitis C without hepatic coma: Secondary | ICD-10-CM | POA: Diagnosis present

## 2019-06-19 DIAGNOSIS — T8182XA Emphysema (subcutaneous) resulting from a procedure, initial encounter: Secondary | ICD-10-CM

## 2019-06-19 DIAGNOSIS — Z8249 Family history of ischemic heart disease and other diseases of the circulatory system: Secondary | ICD-10-CM | POA: Diagnosis not present

## 2019-06-19 DIAGNOSIS — Y848 Other medical procedures as the cause of abnormal reaction of the patient, or of later complication, without mention of misadventure at the time of the procedure: Secondary | ICD-10-CM | POA: Diagnosis not present

## 2019-06-19 DIAGNOSIS — Z881 Allergy status to other antibiotic agents status: Secondary | ICD-10-CM

## 2019-06-19 DIAGNOSIS — K5900 Constipation, unspecified: Secondary | ICD-10-CM | POA: Diagnosis not present

## 2019-06-19 DIAGNOSIS — F17210 Nicotine dependence, cigarettes, uncomplicated: Secondary | ICD-10-CM | POA: Diagnosis present

## 2019-06-19 DIAGNOSIS — Z4682 Encounter for fitting and adjustment of non-vascular catheter: Secondary | ICD-10-CM

## 2019-06-19 DIAGNOSIS — J95812 Postprocedural air leak: Secondary | ICD-10-CM | POA: Diagnosis not present

## 2019-06-19 DIAGNOSIS — D72829 Elevated white blood cell count, unspecified: Secondary | ICD-10-CM | POA: Diagnosis not present

## 2019-06-19 DIAGNOSIS — T797XXA Traumatic subcutaneous emphysema, initial encounter: Secondary | ICD-10-CM | POA: Diagnosis not present

## 2019-06-19 DIAGNOSIS — D62 Acute posthemorrhagic anemia: Secondary | ICD-10-CM | POA: Diagnosis not present

## 2019-06-19 DIAGNOSIS — Z902 Acquired absence of lung [part of]: Secondary | ICD-10-CM

## 2019-06-19 DIAGNOSIS — J9811 Atelectasis: Secondary | ICD-10-CM

## 2019-06-19 DIAGNOSIS — C779 Secondary and unspecified malignant neoplasm of lymph node, unspecified: Secondary | ICD-10-CM | POA: Diagnosis present

## 2019-06-19 HISTORY — PX: LYMPH NODE DISSECTION: SHX5087

## 2019-06-19 HISTORY — PX: INTERCOSTAL NERVE BLOCK: SHX5021

## 2019-06-19 LAB — BLOOD GAS, ARTERIAL
Acid-base deficit: 0.8 mmol/L (ref 0.0–2.0)
Bicarbonate: 24.1 mmol/L (ref 20.0–28.0)
FIO2: 28
O2 Saturation: 99.2 %
Patient temperature: 37
pCO2 arterial: 44.9 mmHg (ref 32.0–48.0)
pH, Arterial: 7.349 — ABNORMAL LOW (ref 7.350–7.450)
pO2, Arterial: 206 mmHg — ABNORMAL HIGH (ref 83.0–108.0)

## 2019-06-19 LAB — PREPARE RBC (CROSSMATCH)

## 2019-06-19 SURGERY — LOBECTOMY, LUNG, ROBOT-ASSISTED, USING VATS
Anesthesia: General | Site: Chest | Laterality: Right

## 2019-06-19 MED ORDER — SCOPOLAMINE 1 MG/3DAYS TD PT72
MEDICATED_PATCH | TRANSDERMAL | Status: AC
  Administered 2019-06-19: 1.5 mg via TRANSDERMAL
  Filled 2019-06-19: qty 1

## 2019-06-19 MED ORDER — ACETAMINOPHEN 160 MG/5ML PO SOLN: 1000.0000 mg | Freq: Four times a day (QID) | ORAL | Status: AC

## 2019-06-19 MED ORDER — DIVALPROEX SODIUM 250 MG PO DR TAB
250.0000 mg | DELAYED_RELEASE_TABLET | Freq: Every day | ORAL | Status: DC
  Administered 2019-06-19 – 2019-07-01 (×13): 250 mg via ORAL
  Filled 2019-06-19 (×13): qty 1

## 2019-06-19 MED ORDER — RISPERIDONE 2 MG PO TABS
2.0000 mg | ORAL_TABLET | Freq: Every day | ORAL | Status: DC
  Administered 2019-06-20 – 2019-07-01 (×12): 2 mg via ORAL
  Filled 2019-06-19 (×12): qty 1

## 2019-06-19 MED ORDER — VANCOMYCIN HCL IN DEXTROSE 1-5 GM/200ML-% IV SOLN: 1000.0000 mg | INTRAVENOUS | Status: AC

## 2019-06-19 MED ORDER — BUPIVACAINE LIPOSOME 1.3 % IJ SUSP
20.0000 mL | Freq: Once | INTRAMUSCULAR | Status: DC
  Filled 2019-06-19: qty 20

## 2019-06-19 MED ORDER — MIDAZOLAM HCL 2 MG/2ML IJ SOLN
INTRAMUSCULAR | Status: AC
  Filled 2019-06-19: qty 2

## 2019-06-19 MED ORDER — LIDOCAINE 2% (20 MG/ML) 5 ML SYRINGE
INTRAMUSCULAR | Status: DC | PRN
  Administered 2019-06-19: 100 mg via INTRAVENOUS

## 2019-06-19 MED ORDER — SUGAMMADEX SODIUM 200 MG/2ML IV SOLN
INTRAVENOUS | Status: DC | PRN
Start: 2019-06-19 — End: 2019-06-19
  Administered 2019-06-19: 200 mg via INTRAVENOUS

## 2019-06-19 MED ORDER — SENNOSIDES-DOCUSATE SODIUM 8.6-50 MG PO TABS
1.0000 | ORAL_TABLET | Freq: Every day | ORAL | Status: DC
  Administered 2019-06-20 – 2019-06-30 (×11): 1 via ORAL
  Filled 2019-06-19 (×12): qty 1

## 2019-06-19 MED ORDER — MOMETASONE FURO-FORMOTEROL FUM 200-5 MCG/ACT IN AERO
2.0000 | INHALATION_SPRAY | Freq: Two times a day (BID) | RESPIRATORY_TRACT | Status: DC
  Administered 2019-06-19 – 2019-07-02 (×25): 2 via RESPIRATORY_TRACT
  Filled 2019-06-19: qty 8.8

## 2019-06-19 MED ORDER — ACETAMINOPHEN 500 MG PO TABS
1000.0000 mg | ORAL_TABLET | Freq: Four times a day (QID) | ORAL | Status: AC
  Filled 2019-06-19 (×4): qty 2

## 2019-06-19 MED ORDER — FENTANYL CITRATE (PF) 250 MCG/5ML IJ SOLN
INTRAMUSCULAR | Status: AC
  Filled 2019-06-19: qty 5

## 2019-06-19 MED ORDER — SODIUM CHLORIDE 0.9 % IV SOLN: INTRAVENOUS | Status: DC | PRN

## 2019-06-19 MED ORDER — BISACODYL 5 MG PO TBEC
10.0000 mg | DELAYED_RELEASE_TABLET | Freq: Every day | ORAL | Status: DC
  Administered 2019-06-20 – 2019-07-01 (×11): 10 mg via ORAL
  Filled 2019-06-19 (×11): qty 2

## 2019-06-19 MED ORDER — LIDOCAINE 2% (20 MG/ML) 5 ML SYRINGE
INTRAMUSCULAR | Status: AC
  Filled 2019-06-19: qty 5

## 2019-06-19 MED ORDER — LACTATED RINGERS IV SOLN: INTRAVENOUS | Status: DC | PRN

## 2019-06-19 MED ORDER — SODIUM CHLORIDE 0.9 % IR SOLN
Status: DC | PRN
  Administered 2019-06-19: 1000 mL

## 2019-06-19 MED ORDER — FENTANYL CITRATE (PF) 100 MCG/2ML IJ SOLN
INTRAMUSCULAR | Status: DC | PRN
  Administered 2019-06-19 (×2): 50 ug via INTRAVENOUS
  Administered 2019-06-19: 100 ug via INTRAVENOUS
  Administered 2019-06-19 (×3): 50 ug via INTRAVENOUS

## 2019-06-19 MED ORDER — TRAMADOL HCL 50 MG PO TABS
50.0000 mg | ORAL_TABLET | Freq: Four times a day (QID) | ORAL | Status: DC | PRN
  Administered 2019-06-19 – 2019-06-24 (×7): 100 mg via ORAL
  Filled 2019-06-19 (×7): qty 2

## 2019-06-19 MED ORDER — ROCURONIUM BROMIDE 10 MG/ML (PF) SYRINGE
PREFILLED_SYRINGE | INTRAVENOUS | Status: AC
  Filled 2019-06-19: qty 10

## 2019-06-19 MED ORDER — DEXAMETHASONE SODIUM PHOSPHATE 10 MG/ML IJ SOLN
INTRAMUSCULAR | Status: AC
  Filled 2019-06-19: qty 1

## 2019-06-19 MED ORDER — ENOXAPARIN SODIUM 40 MG/0.4ML ~~LOC~~ SOLN
40.0000 mg | Freq: Every day | SUBCUTANEOUS | Status: DC
  Administered 2019-06-20 – 2019-07-01 (×12): 40 mg via SUBCUTANEOUS
  Filled 2019-06-19 (×12): qty 0.4

## 2019-06-19 MED ORDER — VANCOMYCIN HCL IN DEXTROSE 1-5 GM/200ML-% IV SOLN
1000.0000 mg | Freq: Two times a day (BID) | INTRAVENOUS | Status: AC
  Administered 2019-06-19: 1000 mg via INTRAVENOUS
  Filled 2019-06-19 (×2): qty 200

## 2019-06-19 MED ORDER — PROPOFOL 10 MG/ML IV BOLUS
INTRAVENOUS | Status: DC | PRN
  Administered 2019-06-19: 150 mg via INTRAVENOUS
  Administered 2019-06-19: 50 mg via INTRAVENOUS

## 2019-06-19 MED ORDER — VANCOMYCIN HCL 1000 MG IV SOLR
INTRAVENOUS | Status: DC | PRN
  Administered 2019-06-19: 1000 mg via INTRAVENOUS

## 2019-06-19 MED ORDER — PHENYLEPHRINE HCL-NACL 10-0.9 MG/250ML-% IV SOLN
INTRAVENOUS | Status: DC | PRN
  Administered 2019-06-19: 20 ug/min via INTRAVENOUS

## 2019-06-19 MED ORDER — SODIUM CHLORIDE 0.9 % IV SOLN: INTRAVENOUS | Status: DC

## 2019-06-19 MED ORDER — SODIUM CHLORIDE 0.9% IV SOLUTION: Freq: Once | INTRAVENOUS | Status: DC

## 2019-06-19 MED ORDER — HEMOSTATIC AGENTS (NO CHARGE) OPTIME
TOPICAL | Status: DC | PRN
  Administered 2019-06-19 (×4): 1 via TOPICAL

## 2019-06-19 MED ORDER — MIDAZOLAM HCL 5 MG/5ML IJ SOLN
INTRAMUSCULAR | Status: DC | PRN
  Administered 2019-06-19: 2 mg via INTRAVENOUS

## 2019-06-19 MED ORDER — VANCOMYCIN HCL IN DEXTROSE 1-5 GM/200ML-% IV SOLN
INTRAVENOUS | Status: AC
  Administered 2019-06-19: 1000 mg via INTRAVENOUS
  Filled 2019-06-19: qty 200

## 2019-06-19 MED ORDER — ONDANSETRON HCL 4 MG/2ML IJ SOLN: 4.0000 mg | Freq: Four times a day (QID) | INTRAMUSCULAR | Status: DC | PRN

## 2019-06-19 MED ORDER — CHLORHEXIDINE GLUCONATE CLOTH 2 % EX PADS
6.0000 | MEDICATED_PAD | Freq: Every day | CUTANEOUS | Status: DC
  Administered 2019-06-20 – 2019-06-25 (×5): 6 via TOPICAL

## 2019-06-19 MED ORDER — ALBUTEROL SULFATE (2.5 MG/3ML) 0.083% IN NEBU
2.5000 mg | INHALATION_SOLUTION | RESPIRATORY_TRACT | Status: DC | PRN
  Administered 2019-06-29 – 2019-06-30 (×5): 2.5 mg via RESPIRATORY_TRACT
  Filled 2019-06-19 (×7): qty 3

## 2019-06-19 MED ORDER — DEXAMETHASONE SODIUM PHOSPHATE 10 MG/ML IJ SOLN
INTRAMUSCULAR | Status: DC | PRN
  Administered 2019-06-19: 10 mg via INTRAVENOUS

## 2019-06-19 MED ORDER — PANTOPRAZOLE SODIUM 40 MG PO TBEC
40.0000 mg | DELAYED_RELEASE_TABLET | Freq: Every day | ORAL | Status: DC
  Administered 2019-06-20 – 2019-07-02 (×13): 40 mg via ORAL
  Filled 2019-06-19 (×14): qty 1

## 2019-06-19 MED ORDER — PROMETHAZINE HCL 25 MG/ML IJ SOLN: 6.2500 mg | INTRAMUSCULAR | Status: DC | PRN

## 2019-06-19 MED ORDER — KETOROLAC TROMETHAMINE 15 MG/ML IJ SOLN
15.0000 mg | Freq: Four times a day (QID) | INTRAMUSCULAR | Status: AC | PRN
  Administered 2019-06-19 – 2019-06-21 (×4): 15 mg via INTRAVENOUS
  Filled 2019-06-19 (×4): qty 1

## 2019-06-19 MED ORDER — OXYCODONE HCL 5 MG PO TABS
5.0000 mg | ORAL_TABLET | ORAL | Status: DC | PRN
  Administered 2019-06-19: 5 mg via ORAL
  Administered 2019-06-19 – 2019-06-21 (×9): 10 mg via ORAL
  Administered 2019-06-22: 5 mg via ORAL
  Administered 2019-06-22 – 2019-07-02 (×26): 10 mg via ORAL
  Filled 2019-06-19 (×13): qty 2
  Filled 2019-06-19: qty 1
  Filled 2019-06-19 (×2): qty 2
  Filled 2019-06-19: qty 1
  Filled 2019-06-19 (×14): qty 2
  Filled 2019-06-19: qty 1
  Filled 2019-06-19: qty 2
  Filled 2019-06-19: qty 1
  Filled 2019-06-19 (×5): qty 2

## 2019-06-19 MED ORDER — PROPOFOL 10 MG/ML IV BOLUS
INTRAVENOUS | Status: AC
  Filled 2019-06-19: qty 40

## 2019-06-19 MED ORDER — SODIUM CHLORIDE FLUSH 0.9 % IV SOLN
INTRAVENOUS | Status: DC | PRN
  Administered 2019-06-19: 85 mL

## 2019-06-19 MED ORDER — SCOPOLAMINE 1 MG/3DAYS TD PT72: 1.0000 | MEDICATED_PATCH | Freq: Once | TRANSDERMAL | Status: DC

## 2019-06-19 MED ORDER — FENTANYL CITRATE (PF) 100 MCG/2ML IJ SOLN: 25.0000 ug | INTRAMUSCULAR | Status: DC | PRN

## 2019-06-19 MED ORDER — ONDANSETRON HCL 4 MG/2ML IJ SOLN
INTRAMUSCULAR | Status: AC
  Filled 2019-06-19: qty 2

## 2019-06-19 MED ORDER — ONDANSETRON HCL 4 MG/2ML IJ SOLN
INTRAMUSCULAR | Status: DC | PRN
  Administered 2019-06-19: 4 mg via INTRAVENOUS

## 2019-06-19 MED ORDER — BUPIVACAINE HCL (PF) 0.5 % IJ SOLN
INTRAMUSCULAR | Status: AC
  Filled 2019-06-19: qty 30

## 2019-06-19 MED ORDER — 0.9 % SODIUM CHLORIDE (POUR BTL) OPTIME
TOPICAL | Status: DC | PRN
  Administered 2019-06-19: 3000 mL

## 2019-06-19 MED ORDER — ROCURONIUM BROMIDE 10 MG/ML (PF) SYRINGE
PREFILLED_SYRINGE | INTRAVENOUS | Status: DC | PRN
  Administered 2019-06-19: 60 mg via INTRAVENOUS
  Administered 2019-06-19 (×2): 30 mg via INTRAVENOUS
  Administered 2019-06-19: 40 mg via INTRAVENOUS

## 2019-06-19 SURGICAL SUPPLY — 116 items
APPLIER CLIP ROT 10 11.4 M/L (STAPLE)
BLADE CLIPPER SURG (BLADE) IMPLANT
BLADE SURG SZ11 CARB STEEL (BLADE) IMPLANT
BNDG COHESIVE 6X5 TAN STRL LF (GAUZE/BANDAGES/DRESSINGS) ×3 IMPLANT
CANISTER SUCT 3000ML PPV (MISCELLANEOUS) ×6 IMPLANT
CANNULA REDUC XI 12-8 STAPL (CANNULA) ×2
CANNULA REDUC XI 12-8MM STAPL (CANNULA) ×2
CANNULA REDUCER 12-8 DVNC XI (CANNULA) ×2 IMPLANT
CATH THORACIC 28FR (CATHETERS) IMPLANT
CATH THORACIC 28FR RT ANG (CATHETERS) IMPLANT
CATH THORACIC 36FR (CATHETERS) IMPLANT
CATH THORACIC 36FR RT ANG (CATHETERS) IMPLANT
CLIP APPLIE ROT 10 11.4 M/L (STAPLE) IMPLANT
CLIP VESOCCLUDE MED 6/CT (CLIP) IMPLANT
CNTNR URN SCR LID CUP LEK RST (MISCELLANEOUS) ×15 IMPLANT
CONN ST 1/4X3/8  BEN (MISCELLANEOUS) ×2
CONN ST 1/4X3/8 BEN (MISCELLANEOUS) ×1 IMPLANT
CONN Y 3/8X3/8X3/8  BEN (MISCELLANEOUS) ×2
CONN Y 3/8X3/8X3/8 BEN (MISCELLANEOUS) ×1 IMPLANT
CONT SPEC 4OZ STRL OR WHT (MISCELLANEOUS) ×30
DEFOGGER SCOPE WARMER CLEARIFY (MISCELLANEOUS) ×3 IMPLANT
DERMABOND ADVANCED (GAUZE/BANDAGES/DRESSINGS) ×4
DERMABOND ADVANCED .7 DNX12 (GAUZE/BANDAGES/DRESSINGS) ×2 IMPLANT
DRAIN CHANNEL 28F RND 3/8 FF (WOUND CARE) IMPLANT
DRAIN CHANNEL 32F RND 10.7 FF (WOUND CARE) IMPLANT
DRAPE ARM DVNC X/XI (DISPOSABLE) ×4 IMPLANT
DRAPE COLUMN DVNC XI (DISPOSABLE) ×1 IMPLANT
DRAPE CV SPLIT W-CLR ANES SCRN (DRAPES) ×3 IMPLANT
DRAPE DA VINCI XI ARM (DISPOSABLE) ×8
DRAPE DA VINCI XI COLUMN (DISPOSABLE) ×2
DRAPE INCISE IOBAN 66X45 STRL (DRAPES) IMPLANT
DRAPE ORTHO SPLIT 77X108 STRL (DRAPES) ×2
DRAPE SURG ORHT 6 SPLT 77X108 (DRAPES) ×1 IMPLANT
DRAPE WARM FLUID 44X44 (DRAPES) ×3 IMPLANT
ELECT BLADE 6.5 EXT (BLADE) ×3 IMPLANT
ELECT REM PT RETURN 9FT ADLT (ELECTROSURGICAL) ×3
ELECTRODE REM PT RTRN 9FT ADLT (ELECTROSURGICAL) ×1 IMPLANT
GAUZE KITTNER 4X5 RF (MISCELLANEOUS) ×12 IMPLANT
GAUZE SPONGE 4X4 12PLY STRL (GAUZE/BANDAGES/DRESSINGS) ×3 IMPLANT
GAUZE SPONGE 4X4 12PLY STRL LF (GAUZE/BANDAGES/DRESSINGS) ×3 IMPLANT
GLOVE TRIUMPH SURG SIZE 7.5 (KITS) ×6 IMPLANT
GOWN STRL REUS W/ TWL LRG LVL3 (GOWN DISPOSABLE) ×3 IMPLANT
GOWN STRL REUS W/ TWL XL LVL3 (GOWN DISPOSABLE) ×3 IMPLANT
GOWN STRL REUS W/TWL 2XL LVL3 (GOWN DISPOSABLE) ×3 IMPLANT
GOWN STRL REUS W/TWL LRG LVL3 (GOWN DISPOSABLE) ×6
GOWN STRL REUS W/TWL XL LVL3 (GOWN DISPOSABLE) ×6
HEMOSTAT SURGICEL 2X14 (HEMOSTASIS) ×12 IMPLANT
IRRIGATION STRYKERFLOW (MISCELLANEOUS) ×1 IMPLANT
IRRIGATOR STRYKERFLOW (MISCELLANEOUS) ×3
KIT BASIN OR (CUSTOM PROCEDURE TRAY) ×3 IMPLANT
KIT SUCTION CATH 14FR (SUCTIONS) IMPLANT
KIT TURNOVER KIT B (KITS) ×3 IMPLANT
LOOP VESSEL SUPERMAXI WHITE (MISCELLANEOUS) IMPLANT
NEEDLE HYPO 25GX1X1/2 BEV (NEEDLE) ×3 IMPLANT
NEEDLE SPNL 22GX3.5 QUINCKE BK (NEEDLE) ×3 IMPLANT
NS IRRIG 1000ML POUR BTL (IV SOLUTION) ×6 IMPLANT
PACK CHEST (CUSTOM PROCEDURE TRAY) ×3 IMPLANT
PAD ARMBOARD 7.5X6 YLW CONV (MISCELLANEOUS) ×6 IMPLANT
RELOAD STAPLER 2.5X45 WHT DVNC (STAPLE) ×3 IMPLANT
RELOAD STAPLER 3.5X45 BLU DVNC (STAPLE) ×1 IMPLANT
RELOAD STAPLER 4.3X45 GRN DVNC (STAPLE) ×1 IMPLANT
SCISSORS LAP 5X35 DISP (ENDOMECHANICALS) IMPLANT
SEAL CANN UNIV 5-8 DVNC XI (MISCELLANEOUS) ×2 IMPLANT
SEAL XI 5MM-8MM UNIVERSAL (MISCELLANEOUS) ×4
SEALANT PROGEL (MISCELLANEOUS) IMPLANT
SEALANT SURG COSEAL 4ML (VASCULAR PRODUCTS) IMPLANT
SEALANT SURG COSEAL 8ML (VASCULAR PRODUCTS) IMPLANT
SET TUBE SMOKE EVAC HIGH FLOW (TUBING) ×3 IMPLANT
SHEARS HARMONIC HDI 20CM (ELECTROSURGICAL) IMPLANT
SOLUTION ELECTROLUBE (MISCELLANEOUS) ×3 IMPLANT
SPECIMEN JAR MEDIUM (MISCELLANEOUS) IMPLANT
SPONGE INTESTINAL PEANUT (DISPOSABLE) IMPLANT
SPONGE TONSIL TAPE 1 RFD (DISPOSABLE) IMPLANT
STAPLER 45 SUREFORM CVD (STAPLE) ×2
STAPLER 45 SUREFORM CVD DVNC (STAPLE) ×1 IMPLANT
STAPLER CANNULA SEAL DVNC XI (STAPLE) ×2 IMPLANT
STAPLER CANNULA SEAL XI (STAPLE) ×4
STAPLER RELOAD 2.5X45 WHITE (STAPLE) ×6
STAPLER RELOAD 2.5X45 WHT DVNC (STAPLE) ×3
STAPLER RELOAD 3.5X45 BLU DVNC (STAPLE) ×1
STAPLER RELOAD 3.5X45 BLUE (STAPLE) ×2
STAPLER RELOAD 4.3X45 GREEN (STAPLE) ×2
STAPLER RELOAD 4.3X45 GRN DVNC (STAPLE) ×1
SUT PDS AB 3-0 SH 27 (SUTURE) IMPLANT
SUT PROLENE 4 0 RB 1 (SUTURE)
SUT PROLENE 4-0 RB1 .5 CRCL 36 (SUTURE) IMPLANT
SUT SILK  1 MH (SUTURE) ×4
SUT SILK 1 MH (SUTURE) ×2 IMPLANT
SUT SILK 1 TIES 10X30 (SUTURE) IMPLANT
SUT SILK 2 0 SH (SUTURE) ×3 IMPLANT
SUT SILK 2 0SH CR/8 30 (SUTURE) IMPLANT
SUT SILK 3 0 SH 30 (SUTURE) IMPLANT
SUT SILK 3 0SH CR/8 30 (SUTURE) IMPLANT
SUT VIC AB 1 CTX 36 (SUTURE) ×2
SUT VIC AB 1 CTX36XBRD ANBCTR (SUTURE) ×1 IMPLANT
SUT VIC AB 2-0 CT1 27 (SUTURE) ×2
SUT VIC AB 2-0 CT1 TAPERPNT 27 (SUTURE) ×1 IMPLANT
SUT VIC AB 2-0 CTX 36 (SUTURE) ×3 IMPLANT
SUT VIC AB 3-0 MH 27 (SUTURE) IMPLANT
SUT VIC AB 3-0 X1 27 (SUTURE) ×6 IMPLANT
SUT VICRYL 0 TIES 12 18 (SUTURE) ×6 IMPLANT
SUT VICRYL 0 UR6 27IN ABS (SUTURE) ×9 IMPLANT
SUT VICRYL 2 TP 1 (SUTURE) IMPLANT
SYR 20ML ECCENTRIC (SYRINGE) ×3 IMPLANT
SYR 30ML LL (SYRINGE) ×3 IMPLANT
SYSTEM RETRIEVAL ANCHOR 12 (MISCELLANEOUS) ×3 IMPLANT
SYSTEM SAHARA CHEST DRAIN ATS (WOUND CARE) ×3 IMPLANT
TAPE CLOTH 4X10 WHT NS (GAUZE/BANDAGES/DRESSINGS) ×3 IMPLANT
TAPE CLOTH SURG 6X10 WHT LF (GAUZE/BANDAGES/DRESSINGS) ×3 IMPLANT
TIP APPLICATOR SPRAY EXTEND 16 (VASCULAR PRODUCTS) IMPLANT
TOWEL GREEN STERILE (TOWEL DISPOSABLE) ×3 IMPLANT
TRAY FOLEY MTR SLVR 16FR STAT (SET/KITS/TRAYS/PACK) ×3 IMPLANT
TRAY WAYNE PNEUMOTHORAX 14X18 (TRAY / TRAY PROCEDURE) ×3 IMPLANT
TROCAR BLADELESS 15MM (ENDOMECHANICALS) IMPLANT
TROCAR XCEL 12X100 BLDLESS (ENDOMECHANICALS) ×3 IMPLANT
WATER STERILE IRR 1000ML POUR (IV SOLUTION) ×3 IMPLANT

## 2019-06-19 NOTE — Interval H&P Note (Signed)
History and Physical Interval Note:  06/19/2019 7:53 AM  Rachel Gross  has presented today for surgery, with the diagnosis of RLL NODULE.  The various methods of treatment have been discussed with the patient and family. After consideration of risks, benefits and other options for treatment, the patient has consented to  Procedure(s): XI ROBOTIC [place] (Right) as a surgical intervention.  The patient's history has been reviewed, patient examined, no change in status, stable for surgery.  I have reviewed the patient's chart and labs.  Questions were answered to the patient's satisfaction.     [name]

## 2019-06-19 NOTE — Anesthesia Postprocedure Evaluation (Signed)
Anesthesia Post Note  Patient: Rachel Gross  Procedure(s) Performed: XI ROBOTIC ASSISTED THORASCOPY - RIGHT LOWER LOBE LOBECTOMY (Right Chest) Intercostal Nerve Block (Right Chest) Lymph Node Dissection (Right Chest)     Patient location during evaluation: PACU Anesthesia Type: General Level of consciousness: awake and alert Pain management: pain level controlled Vital Signs Assessment: post-procedure vital signs reviewed and stable Respiratory status: spontaneous breathing, nonlabored ventilation, respiratory function stable and patient connected to nasal cannula oxygen Cardiovascular status: blood pressure returned to baseline and stable Postop Assessment: no apparent nausea or vomiting Anesthetic complications: no    Last Vitals:  Vitals:   06/19/19 1433 06/19/19 1500  BP: 104/71 106/76  Pulse: 88 93  Resp: 14 14  Temp:  36.4 C  SpO2: 95% 97%    Last Pain:  Vitals:   06/19/19 1500  TempSrc: Oral  PainSc: [place]

## 2019-06-19 NOTE — Anesthesia Procedure Notes (Signed)
Arterial Line Insertion Start/End5/20/2021 7:05 AM, 06/19/2019 7:19 AM Performed by: Glynda Jaeger, CRNA, CRNA  Preanesthetic checklist: patient identified, IV checked, site marked, risks and benefits discussed, surgical consent, monitors and equipment checked, pre-op evaluation, timeout performed and anesthesia consent Patient sedated Right, radial was placed Catheter size: 20 G Hand hygiene performed  and maximum sterile barriers used  Allen's test indicative of satisfactory collateral circulation Attempts: 1 Procedure performed without using ultrasound guided technique. Following insertion, dressing applied and Biopatch. Post procedure assessment: normal  Patient tolerated the procedure well with no immediate complications.

## 2019-06-19 NOTE — Transfer of Care (Signed)
Immediate Anesthesia Transfer of Care Note  Patient: Rachel Gross  Procedure(s) Performed: XI ROBOTIC ASSISTED THORASCOPY - RIGHT LOWER LOBE LOBECTOMY (Right Chest) Intercostal Nerve Block (Right Chest) Lymph Node Dissection (Right Chest)  Patient Location: PACU  Anesthesia Type:General  Level of Consciousness: awake, alert , patient cooperative and responds to stimulation  Airway & Oxygen Therapy: Patient Spontanous Breathing and Patient connected to face mask oxygen  Post-op Assessment: Report given to RN, Post -op Vital signs reviewed and stable and Patient moving all extremities X 4  Post vital signs: Reviewed and stable  Last Vitals:  Vitals Value Taken Time  BP 109/78 06/19/19 1232  Temp    Pulse 87 06/19/19 1234  Resp 15 06/19/19 1234  SpO2 100 % 06/19/19 1234  Vitals shown include unvalidated device data.  Last Pain:  Vitals:   06/19/19 0702  TempSrc:   PainSc: 0-No pain         Complications: No apparent anesthesia complications

## 2019-06-19 NOTE — Anesthesia Procedure Notes (Signed)
Procedure Name: Intubation Date/Time: 06/19/2019 8:18 AM Performed by: Catalina Gravel, MD Pre-anesthesia Checklist: Patient identified, Emergency Drugs available, Suction available and Patient being monitored Patient Re-evaluated:Patient Re-evaluated prior to induction Oxygen Delivery Method: Circle system utilized Preoxygenation: Pre-oxygenation with 100% oxygen Induction Type: IV induction Ventilation: Mask ventilation without difficulty Laryngoscope Size: Glidescope Grade View: Grade III Tube type: Oral Endobronchial tube: Double lumen EBT and EBT position confirmed by fiberoptic bronchoscope and 37 Fr Number of attempts: 3 Airway Equipment and Method: Oral airway and Video-laryngoscopy Placement Confirmation: ETT inserted through vocal cords under direct vision,  positive ETCO2 and breath sounds checked- equal and bilateral Tube secured with: Tape Dental Injury: Teeth and Oropharynx as per pre-operative assessment  Comments: DL x 1 with Mac 3 by SRNA. DL x 2 by Gifford Shave MD, unable to pass vivasight. VL x 1 with glidescope 3, grade 1 view. Standard DLT passed successfully.

## 2019-06-19 NOTE — Brief Op Note (Addendum)
06/19/2019  12:10 PM  PATIENT:  Rachel Gross  54 y.o. 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  PROCEDURE:  Procedure(s): XI ROBOTIC ASSISTED THORASCOPY - RIGHT LOWER LOBE LOBECTOMY (Right) Intercostal Nerve Block (Right) Lymph Node Dissection (Right)  SURGEON:  Surgeon(s) and Role:    * [name], MD - Primary  PHYSICIAN ASSISTANT: WAYNE GOLD PA-C  ANESTHESIA:   general  EBL:  300 mL   BLOOD ADMINISTERED:none  DRAINS: 2  Chest Tube(s) in the RIGHT CHEST, ONE PIGTAIL, ONE BLAKE   LOCAL MEDICATIONS USED:  OTHER:  EXPAREL  SPECIMEN:  Source of Specimen:  RLL AND MULTIPLE LN'S  DISPOSITION OF SPECIMEN:  PATHOLOGY  COUNTS:  YES  TOURNIQUET:  * No tourniquets in log *  DICTATION: .Other Dictation: Dictation Number [id number]  PLAN OF CARE: Admit to inpatient   PATIENT DISPOSITION:  PACU - hemodynamically stable.   Delay start of Pharmacological VTE agent (>24hrs) due to surgical blood loss or risk of bleeding: yes  FROZEN: ADENOCARCINOMA  COMPLICATION : NO KNOWN  Frozen= adenocarcinoma, bronchial margin negative

## 2019-06-19 NOTE — Op Note (Signed)
NAME: APARNA, VANDERWEELE MEDICAL RECORD [id number] ACCOUNT [id number] DATE OF BIRTH:07-16-1965 FACILITY: [facility] LOCATION: [facility] PHYSICIAN:Melodye Swor Chaya Jan, MD  OPERATIVE REPORT  DATE OF PROCEDURE:  06/19/2019  PREOPERATIVE DIAGNOSIS:  Right lower lobe lung nodule, suspected non-small cell lung cancer, clinical stage IA(T1,N0).  POSTOPERATIVE DIAGNOSIS:  Adenocarcinoma, right lower lobe, clinical stage IA (T1, N0).  PROCEDURES:   1.  Xi robotic-assisted right thoracoscopy 2.  Lysis of adhesions.  2.  Right lower lobectomy.  3.  Lymph node dissection. 4.  Intercostal nerve blocks levels 3 through 10.  SURGEON:  Modesto Charon, MD  ASSISTANT:  Jadene Pierini, PA-C  ANESTHESIA:  General.  FINDINGS:  Severe adhesions throughout the pleural space.  Multiple enlarged, but otherwise relatively benign appearing lymph nodes.  Frozen section revealed adenocarcinoma, bronchial margin negative for tumor.  CLINICAL NOTE:  Ms. Leitch is a 54 year old woman with a history of tobacco abuse, who was found to have a right lower lobe lung nodule on a CT of the abdomen about a year ago.  On repeat CT in April, the nodule had increased in size.  On PET CT, the  nodule was hypermetabolic with an SUV of 4.  There was no evidence of regional or distant metastases.  Findings were consistent with a stage IA nonsmall-cell carcinoma.  She was advised to undergo robotic-assisted right lower lobectomy for definitive  diagnosis and treatment.  The indications, risks, benefits, and alternatives were discussed in detail with the patient.  She understood and accepted the risks and agreed to proceed.  OPERATIVE NOTE:  Ms. Balazs was brought to the preoperative holding area on 06/19/2019.  Anesthesia placed an arterial blood pressure monitoring line and a central venous line.  She was taken to the operating room, anesthetized and intubated with  a double lumen endotracheal tube.  Intravenous antibiotics were  administered.  A Foley catheter was placed.  Sequential compression devices were placed on the calves for DVT prophylaxis.  She was placed in a left lateral decubitus position and the right  chest was prepped and draped in the usual sterile fashion.  Single-lung ventilation of the left lung was initiated.  A timeout was performed.  A solution containing 20 mL of liposomal bupivacaine, 30 mL of 0.5% bupivacaine and 50 mL of saline was prepared.  This was used for local injection at the incision sites, as well as the intercostal nerve blocks.  An incision  was made in the 8th interspace in the midaxillary line.  The chest was entered bluntly using a hemostat.  A port was inserted and the thoracoscope was advanced.  There were severe adhesions within the chest.  Incisions were made approximately 4-5 cm  anterior and posteriorly to this incision and blunt dissection with a fingertip was used to connect the 3 incisions.  The port then was reinserted.  Carbon dioxide was insufflated to a pressure of 10 mmHg.  The scope was inserted and there were  significant adhesions in the pleural space.  The 4th port was not placed at this time due to inability to visualize that area.  An assistant port was placed in the 10th interspace centered between the 2 anterior ports.  After moving to the console,  isolation of the right lung was lost.  Dr. Gifford Shave was able to reposition the tube and once again isolate the right lung.  There was good isolation then throughout the remainder and it was tolerated well throughout the procedure.  Adhesions were taken  down with monopolar cautery.  These were extensive adhesions throughout the entire chest.  There was some bleeding with the takedown of adhesions.  The fissure was noted to be relatively complete.  The adhesions were particularly dense and difficult to  lyse in the posterolateral and inferior chest.  Once this was done, the lower lobe was retracted upwards and the inferior  ligament was divided.  All lymph nodes that were encountered during the dissection were removed and sent as separate specimens for  permanent pathology.  The pleural reflection was divided at the hilum posteriorly.  There were enlarged, but otherwise benign appearing subcarinal nodes.  There was bronchial artery bleeding with removal of these nodes.  This bleeding was ultimately controlled  by packing, holding pressure and then using bipolar cautery.  Attention then was turned to the fissure.  Again, the fissure was essentially complete.  It was completed with the bipolar cautery.  Level 12 nodes were removed from around the vessels.   Level 11 nodes have been removed previously.  The superior segmental arterial branch was encircled and divided with the robotic stapler using a white cartridge.  Next, the basilar segmental artery was divided, again using the stapler with a white  cartridge.  Additional nodes were removed and the inferior pulmonary vein was encircled and divided with the vascular stapler and finally, the robotic stapler with a green cartridge was placed across the base of the left lower lobe bronchus at its origin  and closed.  A test inflation showed good aeration of the upper and middle lobes.  The stapler was fired, transecting the bronchus.  The lower lobe was placed into an endoscopic retrieval bag and brought down to the inferior portion of the chest.  It  was not removed at this time.  Additional adhesions were taken down at the apex and then the upper lobe was retracted posteriorly.  The pleura overlying the mediastinum superior to the azygos vein and posterior to the superior vena cava was opened and  the level 4R nodes were removed.  There were multiple enlarged nodes in this area.  Again, there was some bleeding that was ultimately controlled with bipolar cautery.  The robot then was undocked.  The anteriormost 8th interspace incision was lengthened  enough to allow removal of the  specimen.  The lobe was sent for frozen section of the mass and the bronchial margin.  Final inspection was made for hemostasis.  The chest was copiously irrigated with warm saline.  Test inflation to 30 cm of water pressure revealed no leakage from the bronchial stump.  There was some parenchymal air leak around the middle lobe area.  This was relatively minor.  A 14-French pigtail catheter was placed anteriorly and directed to the apex.  A 28-French Blake drain was placed  through the original 8 mm port incision and placed posteriorly and to the apex.  Both were secured with #1 silk sutures.  Intercostal nerve blocks were performed from the 3rd to the 10th interspace.  A needle was passed from a posterior approach and 10 mL  of the bupivacaine solution was injected into a subpleural plane at each level.  The scope was withdrawn.  Dual-lung ventilation was resumed.  The remaining incisions were closed in standard fashion.  Dermabond was applied.  Chest tubes were placed to  Pleur-Evac on waterseal.  The patient then was extubated in the operating room and taken to the [facility] in good condition.  VN/NUANCE  D:06/19/2019 T:06/19/2019 JOB:[id number]

## 2019-06-19 NOTE — Anesthesia Procedure Notes (Signed)
Central Venous Catheter Insertion Performed by: Catalina Gravel, MD, anesthesiologist Start/End5/20/2021 7:04 AM, 06/19/2019 7:14 AM Preanesthetic checklist: patient identified, IV checked, site marked, risks and benefits discussed, surgical consent, monitors and equipment checked, pre-op evaluation, timeout performed and anesthesia consent Position: Trendelenburg Lidocaine 1% used for infiltration and patient sedated Hand hygiene performed , maximum sterile barriers used  and Seldinger technique used Catheter size: 8 Fr Total catheter length 16. Central line was placed.Double lumen Procedure performed using ultrasound guided technique. Ultrasound Notes:anatomy identified, needle tip was noted to be adjacent to the nerve/plexus identified, no ultrasound evidence of intravascular and/or intraneural injection and image(s) printed for medical record Attempts: 1 Following insertion, line sutured, dressing applied and Biopatch. Post procedure assessment: blood return through all ports, free fluid flow and no air  Patient tolerated the procedure well with no immediate complications.

## 2019-06-20 ENCOUNTER — Inpatient Hospital Stay (HOSPITAL_COMMUNITY): Payer: Medicaid Other

## 2019-06-20 LAB — CBC
HCT: 35.7 % — ABNORMAL LOW (ref 36.0–46.0)
Hemoglobin: 11.5 g/dL — ABNORMAL LOW (ref 12.0–15.0)
MCH: 30.5 pg (ref 26.0–34.0)
MCHC: 32.2 g/dL (ref 30.0–36.0)
MCV: 94.7 fL (ref 80.0–100.0)
Platelets: 147 10*3/uL — ABNORMAL LOW (ref 150–400)
RBC: 3.77 MIL/uL — ABNORMAL LOW (ref 3.87–5.11)
RDW: 13.7 % (ref 11.5–15.5)
WBC: 14.6 10*3/uL — ABNORMAL HIGH (ref 4.0–10.5)
nRBC: 0 % (ref 0.0–0.2)

## 2019-06-20 LAB — BASIC METABOLIC PANEL
Anion gap: 8 (ref 5–15)
BUN: 9 mg/dL (ref 6–20)
CO2: 25 mmol/L (ref 22–32)
Calcium: 8 mg/dL — ABNORMAL LOW (ref 8.9–10.3)
Chloride: 102 mmol/L (ref 98–111)
Creatinine, Ser: 0.72 mg/dL (ref 0.44–1.00)
GFR calc Af Amer: >60 mL/min (ref >60)
GFR calc non Af Amer: >60 mL/min (ref >60)
Glucose, Bld: 102 mg/dL — ABNORMAL HIGH (ref 70–99)
Potassium: 4.6 mmol/L (ref 3.5–5.1)
Sodium: 135 mmol/L (ref 135–145)

## 2019-06-20 MED ORDER — PHENYLEPHRINE HCL-NACL 10-0.9 MG/250ML-% IV SOLN
INTRAVENOUS | Status: AC
  Filled 2019-06-20: qty 500

## 2019-06-20 NOTE — Discharge Summary (Addendum)
Physician Discharge Summary  Patient ID: Rachel Gross MRN: [id number] DOB/AGE: August 16, 1965 54 y.o.  Admit date: 06/19/2019 Discharge date: 07/02/2019  Admission Diagnoses: Right lung mass Discharge Diagnoses:  Active Problems:   S/P lobectomy of lung   Adenocarcinoma of lung, stage 3, right [facility])   Patient Active Problem List   Diagnosis Date Noted  . Adenocarcinoma of lung, stage 3, right ([place]) 07/02/2019  . S/P lobectomy of lung 06/19/2019  . Nodule of lower lobe of right lung 05/15/2019  . Positive colorectal cancer screening using Cologuard test 12/05/2018  . Hepatic cirrhosis ([place]) 11/16/2017  . Thrombocytopenia ([place]) 09/10/2017  . Hepatitis C antibody test positive 09/10/2017  . Abnormal abdominal ultrasound 09/10/2017  . Hypernatremia   . Essential hypertension   . Acute encephalopathy 08/06/2017  . Hyperammonemia ([place]) 08/06/2017  . Dehydration 08/06/2017  . COPD (chronic obstructive pulmonary disease) ([place]) 08/06/2017  . Transaminitis 08/06/2017  . Idiopathic thrombocytopenic purpura (ITP) ([facility]) 06/13/2017   HPI:AT TIME OF CT SURGERY CONSULTATION   Rachel Gross is sent for consultation regarding a right lower lobe lung nodule.  Rachel Gross is a 54 year old woman with a past medical history significant for tobacco abuse, COPD, ethanol abuse, hepatitis C (treated), cirrhosis, portal hypertensive gastropathy, thrombocytopenia and atypical squamous cell of the cervix.  She had a CT of the abdomen in February 2020 which showed a 1.1 x 0.9 x 0.7 cm right lower lobe pulmonary nodule.  There is a report from 2017 from [place] of a CT chest which showed a 5 mm left lower lobe nodule and 2 new pulmonary nodules in the right upper lobe measuring 7 and 6 mm.  No mention of the right lower lobe nodule.  She had a chest CT in April 2021 which showed the nodule increased in size to 1.4 x 1.3 cm.  There was no adenopathy.  PET/CT showed the lower lobe nodule was  hypermetabolic with an SUV of 4.  No evidence of regional or distant metastases.  She gets short of breath with exertion but can walk up a flight of stairs.  She might have to stop and catch her breath at the top.  She has a 40-pack-year history of smoking.  She says she is currently down to about 1/2 pack/day.  She does have wheezing and occasional cough.  She is not having any chest pain, pressure, or tightness.  She said no change in appetite or weight loss.  The patient and her studies were evaluated by Dr. Roxan Hockey who recommended robot-assisted thoracoscopic surgery for resection.  She was admitted this hospitalization for the procedure.  Discharged Condition: good  Hospital Course: The patient was admitted electively and on 06/19/2019 taken the operating room where she underwent the below described procedure.  She tolerated it well and was taken to the postanesthesia care unit in stable condition.  Postoperative hospital course:  Patient is overall progressing well.  She has maintained stable hemodynamics and normal sinus rhythm.  Oxygen has been weaned over time and she maintains adequate saturations on room air.  He has had a small air leak in the initial part of her postoperative course.  A chest tube was removed on postoperative day #2 but the pigtail was left in place at that time.  It was left on waterseal.  She continued to have an air leak and the pigtail was placed to 10 cm of water suction on postop day 3.  She then developed a more clinically significant pneumothorax and Dr. Orvan Seen  placed a new chest tube.  She developed a significant amount of subcutaneous air at that time as well.  The pigtail catheter was removed on postoperative day #4.  This is because it was felt to be possibly in a fissure or nonfunctional.  However she developed sub cutaneous emphysema and her chest tube had to be replaced.  An air leak persisted.  It was felt the patient would require placement of an IBV  placement.  This was performed on 06/26/2019.  The previous chest tube was removed and a new chest tube was placed in the anterior location.  She tolerated the procedure without difficulty and was taken to the PACU in stable condition. The patient's chest tube was free from air leak.  CXR showed no evidence of pneumothorax.  Her chest tube was removed on 06/27/2019.  Follow up CXR showed stable appearance of apical space and atx.  Incisions are noted to be healing well without evidence of infection.  She is tolerating diet and gradually increasing activities using standard protocols.  She is medically stable for discharge home today.  PATHOLOGY:  5 d ago   SURGICAL PATHOLOGY SURGICAL PATHOLOGY  CASE: [id number]  PATIENT: Rachel Gross  Surgical Pathology Report      Clinical History: RLL nodule (cm)      FINAL MICROSCOPIC DIAGNOSIS:   A. LUNG, RIGHT LOWER LOBE, LOBECTOMY:  - Adenocarcinoma, moderately-differentiated, spanning 1.7 cm.  - Tumor is limited to lung.  - Lymphovascular invasion present.  - Resection margins are negative.  - See oncology table.   B. LYMPH NODE, LEVEL 9R, EXCISION:  - One of one lymph nodes negative for carcinoma (0/1).   C. LYMPH NODE, LEVEL 7, EXCISION:  - Metastatic carcinoma in one of one lymph nodes (1/1).   D. LYMPH NODE, LEVEL 7 #2, EXCISION:  - One of one lymph nodes negative for carcinoma (0/1).   E. LYMPH NODE, LEVEL 8, EXCISION:  - One of one lymph nodes negative for carcinoma (0/1).   F. LYMPH NODE, LEVEL 10R, EXCISION:  - One of one lymph nodes negative for carcinoma (0/1).   G. LYMPH NODE, LEVEL 11R, EXCISION:  - One of one lymph nodes negative for carcinoma (0/1).   H. LYMPH NODE, LEVEL 12R, EXCISION:  - One of one lymph nodes negative for carcinoma (0/1).   I. LYMPH NODE, LEVEL 10R #2, EXCISION:  - One of one lymph nodes negative for carcinoma (0/1).   J. LYMPH NODE, LEVEL 12R #2, EXCISION:  - One of one lymph nodes  negative for carcinoma (0/1).   K. LYMPH NODE, LEVEL 10R #3, EXCISION:  - Metastatic carcinoma in one of one lymph nodes (1/1).   L. LYMPH NODE, LEVEL 4R, EXCISION:  - One of one lymph nodes negative for carcinoma (0/1).   M. LYMPH NODE, LEVEL 4R #2, EXCISION:  - One of one lymph nodes negative for carcinoma (0/1).   N. LYMPH NODE, LEVEL 4R #3, EXCISION:  - One of one lymph nodes negative for carcinoma (0/1).   O. LYMPH NODE, EXCISION:  - One of one lymph nodes negative for carcinoma (0/1).   ONCOLOGY TABLE:   LUNG: Resection   Procedure: Right lower lobe lobectomy with lymph node excisions  Specimen Laterality: Right  Tumor Site: Right lower lobe  Tumor Size: 1.7 cm  Tumor Focality: Unifocal  Histologic Type: Adenocarcinoma  Visceral Pleura Invasion: Not identified  Lymphovascular Invasion: Present  Direct Invasion of Adjacent Structures: No adjacent structures present  Margins: Uninvolved by tumor  Treatment Effect: No know presurgical therapy  Regional Lymph Nodes:    Number of Lymph Nodes Involved: 2 (level 7 and 10R)    Number of Lymph Nodes Examined: 14  Pathologic Stage Classification (pTNM, AJCC 8th Edition): pT1c, pN2  Ancillary Studies: Can be performed upon request  Representative Tumor Block: A5  Comment(s): None   (v4.1.0.1)      INTRAOPERATIVE DIAGNOSIS:   A1. LUNG, RIGHT LOWER LOBECTOMY, MASS, FROZEN SECTION:     Tumor. Favor non-small cell carcinoma.     Rapid intraoperative consult diagnosis rendered by Dr. Lyndon Code @ 1157  06/19/2019.  A2. LUNG, RIGHT LOWER LOBECTOMY, BRONCHIAL MARGIN, FROZEN SECTION:      Margin negative for tumor.     Rapid intraoperative consult diagnosis rendered by Dr. Lyndon Code @ 1157  06/19/2019.   GROSS DESCRIPTION:   Specimen A: Lung, right lower lobectomy, received fresh forrapid  intraoperative consult by frozen section.  Specimen integrity: Intact  Size, weight: 171 g, 11 x 8.5 x 4 cm, deflated.   Pleura: Tan-pink, smooth, scattered adhesions and scattered anthracosis.  There is an attached suture over anterior basal segment, clinically  identifying nodule.  Lesion: On sectioning through the area with attached suture, there is  1.7 x 1.6 x 1.4 cm gray-white to anthracotic firm focally ill-defined  intraparenchymal mass.  Margin(s): The mass is 3 cm from the bronchial margin.  Hilar vessels: Unremarkable  Nonneoplastic parenchyma: Pink-red, spongy with moderate anthracosis.  Lymph nodes: None identified  Block Summary:  Block 1 = section of mass, submitted for frozen section.  Block 2 = bronchial margin, submitted for frozen section.  Blocks 3-5 = sections of mass for routine histology  Block 6 = sections including pleura nearest mass  Block 7 = vascular margins   Specimen B: Received fresh labeled level 9R node is a 0.8 x 0.4 x 0.2 cm  yellow-red to anthracotic soft tissue submitted in 1 block.   Specimen C: Received fresh labeled level 7 node is a 1 x 0.6 x 0.25 cm  aggregate of yellow-red to anthracotic soft tissue, submitted in 1  block.   Specimen D: Received fresh labeled level 7 node #2 is a 0.9 x 0.3 x 0.25  cm anthracotic soft tissue, submitted 1  block.   Specimen E: Received fresh labeled level 8 node is a 1.1 cm soft  anthracotic node, bisected, submitted 1 block.   Specimen F: Received fresh labeled level 10 R node is a 0.6 x 0.5 x 0.2  cm aggregate of anthracotic soft tissue, submitted in 1 block.   Specimen G: Received fresh labeled level 11 R node is a 0.7 cm  anthracotic soft tissue, bisected and submitted in 1 block.   Specimen H: Received fresh labeled level 12 R node is a 0.6 x 0.4 x 0.15  cm soft anthracotic tissue, submitted in 1 block.   Specimen I: Received fresh labeled level 10 R node #2 are 2 pieces of  yellow to anthracotic soft tissue, which together are 1 x 0.6 x 0.25 cm.  Submitted in 1 block.   Specimen J: Received fresh labeled  level 12 R node #2 is a 1 x 0.8 x 0.2  cm soft anthracotic tissue which is sectioned and submitted in 1 block.   Specimen K: Received fresh labeled level 10 R node #3 is a 0.7 x 0.6 x  0.2 cm yellow to anthracotic soft tissue, submitted in 1 block.   Specimen  L: Received fresh labeled level 4R node is a 1.4 x 1 x 0.3 cm  aggregate of soft fatty tissue, entirely submitted in 1 block.   Specimen M: Received fresh labeled level 4R node #2 is a 1.1 x 0.7 x 0.2  cm soft fatty tissue, submitted 1 block.   Specimen N: Received fresh labeled level 4R node #3 is a 2.1 x 1 x 0.8  cm yellow to anthracotic, soft to rubbery tissue which is sectioned and  entirely submitted in 1 block.   Specimen: Received fresh labeled lymph node is a 0.8 x 0.6 x 0.5 cm  yellow-gray soft tissue, bisected and submitted in 1 block.   SW 06/20/2019        Consults: None  Significant Diagnostic Studies: Routine serial chest x-rays and postop labs.  Treatments: surgery:   OPERATIVE REPORT  DATE OF PROCEDURE:  06/19/2019  PREOPERATIVE DIAGNOSIS:  Right lower lobe lung nodule, suspected non-small cell lung cancer, clinical stage.  POSTOPERATIVE DIAGNOSIS:  Adenocarcinoma, right lower lobe, clinical stage IA (T1, N0).  PROCEDURES:   1.  Xi robotic-assisted lysis of adhesions.  2.  Right lower lobectomy.  3.  Lymph node dissection. 4.  Intercostal nerve blocks at levels 3 through 10.  SURGEON:  Modesto Charon, MD  1.  Bronchoscopy for intrabronchial valve placement x2.  2.  Replacement of chest tube.  Discharge Exam: Blood pressure (!) 103/54, pulse 87, temperature 98 F (36.7 C), temperature source Oral, resp. rate 15, height 5\' 1"  (1.549 m), weight 82.1 kg, SpO2 95 %.  General appearance: alert, cooperative and no distress Heart: regular rate and rhythm Lungs: coarse BS with scattered ronchi Abdomen: soft, non-tender Extremities: no calf tenderness/edema Wound: incis healing  well Disposition: Discharge disposition: 01-Home or Self Care       Discharge Instructions    Discharge patient   Complete by: As directed    Discharge disposition: 01-Home or Self Care   Discharge patient date: 07/02/2019     Allergies as of 07/02/2019      Reactions   Penicillins Anaphylaxis   Throat swelled Did it involve swelling of the face/tongue/throat, SOB, or low BP? Yes Did it involve sudden or severe rash/hives, skin peeling, or any reaction on the inside of your mouth or nose? No Did you need to seek medical attention at a hospital or doctor's office? No When did it last happen?childhood allergy If all above answers are "NO", may proceed with cephalosporin use.   Amoxicillin    hallucinations Did it involve swelling of the face/tongue/throat, SOB, or low BP? No Did it involve sudden or severe rash/hives, skin peeling, or any reaction on the inside of your mouth or nose? No Did you need to seek medical attention at a hospital or doctor's office? Yes When did it last happen?July 2019 If all above answers are "NO", may proceed with cephalosporin use.   Fish Allergy Nausea Only      Medication List    TAKE these medications   albuterol 108 (90 Base) MCG/ACT inhaler Commonly known as: VENTOLIN HFA Inhale 2 puffs into the lungs every 4 (four) hours as needed for wheezing or shortness of breath.   diphenhydrAMINE 25 MG tablet Commonly known as: BENADRYL Take 25 mg by mouth at bedtime.   divalproex 250 MG DR tablet Commonly known as: DEPAKOTE Take 250 mg by mouth at bedtime.   guaiFENesin 600 MG 12 hr tablet Commonly known as: MUCINEX Take 1 tablet (600 mg total) by  mouth 2 (two) times daily.   pantoprazole 40 MG tablet Commonly known as: PROTONIX Take one tablet before breakfast daily. What changed:   how much to take  how to take this  when to take this  additional instructions   risperiDONE 2 MG tablet Commonly known as:  RISPERDAL Take 2 mg by mouth at bedtime.   Symbicort 160-4.5 MCG/ACT inhaler Generic drug: budesonide-formoterol Inhale 2 puffs into the lungs 2 (two) times daily.   traMADol 50 MG tablet Commonly known as: ULTRAM Take 1 tablet (50 mg total) by mouth every 6 (six) hours as needed for up to 7 days (mild pain).            Durable Medical Equipment  (From admission, onward)         Start     Ordered   07/02/19 0749  For home use only DME oxygen  Once    Question Answer Comment  Length of Need 6 Months   Mode or (Route) Nasal cannula   Liters per Minute 3   Frequency Continuous (stationary and portable oxygen unit needed)   Oxygen delivery system Gas      07/02/19 0748         Follow-up Information    [name], MD Follow up.   Specialty: Cardiothoracic Surgery Why: Please see discharge paperwork for follow-up with Dr. Roxan Hockey and for suture removal at his office.  Obtain a chest x-ray at [place] 1/2-hour prior to appointment with Dr. Roxan Hockey.  It is in the same office complex. Contact information: [address] [place] [address] [phone]        Celene Squibb, MD. Call in 1 day(s).   Specialty: Internal Medicine Contact information: [place] [address] [phone]           Signed: John Giovanni PA-C 07/02/2019, 9:38 AM

## 2019-06-20 NOTE — Discharge Instructions (Signed)
[facility] OFFICE NUMBER [phone] (SURGEON)   Robot-Assisted Thoracic Surgery, Care After This sheet gives you information about how to care for yourself after your procedure. Your health care provider may also give you more specific instructions. If you have problems or questions, contact your health care provider. What can I expect after the procedure? After the procedure, it is common to have:  Some pain and aches in the area of your surgical cuts (incisions).  Pain when breathing in (inhaling) and coughing.  Tiredness (fatigue).  Trouble sleeping.  Constipation. Follow these instructions at home: Medicines  Take over-the-counter and prescription medicines only as told by your health care provider.  If you were prescribed an antibiotic medicine, take it as told by your health care provider. Do not stop taking the antibiotic even if you start to feel better.  Talk with your health care provider about safe and effective ways to manage pain after your procedure. Pain management should fit your specific health needs.  Take prescription pain medicine before pain becomes severe. Relieving and controlling your pain will make breathing easier for you. Activity  Return to your normal activities as told by your health care provider. Ask your health care provider what activities are safe for you.  Do not lift anything that is heavier than 10 lb (4.5 kg), or the limit that you are told, until your health care provider says that it is safe.  Avoid sitting for a long time without moving. Get up and move around one or more times every few hours. Bathing  Do not take baths, swim, or use a hot tub until your health care provider approves. You may take showers. Incision care  Follow instructions from your health care provider about how to take care of your incision(s). Make sure you: ? Wash your hands with soap and water before you change your bandage (dressing). If soap and water are not  available, use hand sanitizer. ? Change your dressing as told by your health care provider. ? Leave stitches (sutures), skin glue, or adhesive strips in place. These skin closures may need to stay in place for 2 weeks or longer. If adhesive strip edges start to loosen and curl up, you may trim the loose edges. Do not remove adhesive strips completely unless your health care provider tells you to do that.  Check your incision area every day for signs of infection. Check for: ? Redness, swelling, or pain. ? Fluid or blood. ? Warmth. ? Pus or a bad smell. Driving  Ask your health care provider when it is safe for you to drive.  Do not drive or use heavy machinery while taking prescription pain medicine. Eating and drinking  Follow instructions from your health care provider about eating or drinking restrictions. These will vary depending on what procedure you had. Your health care provider may recommend: ? A liquid diet or soft diet for the first few days. ? Meals that are smaller and more frequent. ? A diet of fruits, vegetables, whole grains, and low-fat proteins. ? Limiting foods that are high in processed sugar and fat, including fried and sweet foods. Pneumonia prevention   Do not use any products that contain nicotine or tobacco, such as cigarettes and e-cigarettes. If you need help quitting, ask your health care provider.  Avoid secondhand smoke.  Do deep breathing exercises and cough regularly as directed. This helps to clear mucus and prevent pneumonia. If it hurts to cough, try one of these methods to ease  your pain when you cough: ? Hold a pillow against your chest. ? Place the palms of both hands over your incisions (use splinting).  Use an incentive spirometer as directed. This device measures how much air your lungs are getting with each breath. Using this will improve your breathing.  Do pulmonary rehabilitation as directed. This is a program that includes exercise,  education, and support. General instructions  Wear compression stockings as told by your health care provider. These stockings help to prevent blood clots and reduce swelling in your legs.  If you have a drainage tube: ? Follow instructions from your health care provider about how to take care of it. ? Do not travel by airplane after your tube is removed until your health care provider tells you it is safe.  To prevent or treat constipation while you are taking prescription pain medicine, your health care provider may recommend that you: ? Drink enough fluid to keep your urine pale yellow. ? Take over-the-counter or prescription medicines. ? Eat foods that are high in fiber, such as fresh fruits and vegetables, whole grains, and beans. ? Limit foods that are high in fat and processed sugars, such as fried and sweet foods.  Keep all follow-up visits as told by your health care provider. This is important. Contact a health care provider if:  You have redness, swelling, or pain around an incision.  You have fluid or blood coming from an incision.  An incision feels warm to the touch.  You have pus or a bad smell coming from an incision.  You have a fever.  You cannot eat or drink without vomiting.  Your prescription pain medicine is not controlling your pain. Get help right away if:  You have chest pain.  Your heart is beating quickly.  You have trouble breathing.  You have trouble speaking.  You are confused.  You feel weak or dizzy, or you faint. These symptoms may represent a serious problem that is an emergency. Do not wait to see if the symptoms will go away. Get medical help right away. Call your local emergency services (911 in the [place] Do not drive yourself to the hospital. Summary  Talk with your health care provider about safe and effective ways to manage pain after your procedure. Pain management should fit your specific health needs.  Return to your normal  activities as told by your health care provider. Ask your health care provider what activities are safe for you.  Do deep breathing exercises and cough regularly as directed. This helps to clear mucus and prevent pneumonia. If it hurts to cough, ease pain by holding a pillow against your chest or by placing the palms of both hands over your incisions (splinting). This information is not intended to replace advice given to you by your health care provider. Make sure you discuss any questions you have with your health care provider. Document Revised: 11/15/2018 Document Reviewed: 05/22/2016 Elsevier Patient Education  No Name.

## 2019-06-20 NOTE — Progress Notes (Signed)
[address]       [place] [address]             [phone]      1 Day Post-Op Procedure(s) (LRB): XI ROBOTIC ASSISTED THORASCOPY - RIGHT LOWER LOBE LOBECTOMY (Right) Intercostal Nerve Block (Right) Lymph Node Dissection (Right) Subjective: Feels well overall  Objective: Vital signs in last 24 hours: Temp:  [97 F (36.1 C)-99.3 F (37.4 C)] 98.5 F (36.9 C) (05/21 0316) Pulse Rate:  [84-103] 84 (05/21 0316) Cardiac Rhythm: Normal sinus rhythm (05/21 0316) Resp:  [14-26] 20 (05/21 0316) BP: (95-121)/(57-82) 97/62 (05/21 0316) SpO2:  [92 %-98 %] 96 % (05/21 0316) Arterial Line BP: (87-130)/(61-78) 92/73 (05/21 0316)  Hemodynamic parameters for last 24 hours:    Intake/Output from previous day: 05/20 0701 - 05/21 0700 In: 2926.7 [P.O.:1070; I.V.:1406.7; IV Piggyback:450] Out: 1302 [Urine:650; Blood:300; Chest Tube:352] Intake/Output this shift: No intake/output data recorded.  General appearance: alert, cooperative and no distress Heart: regular rate and rhythm Lungs: cracles on right, left is clear Abdomen: benign Extremities: no edema Wound: incis healing well  Lab Results: Recent Labs    06/17/19 1419 06/20/19 0515  WBC 11.6* 14.6*  HGB 15.1* 11.5*  HCT 45.8 35.7*  PLT 162 147*   BMET:  Recent Labs    06/17/19 1419 06/20/19 0515  NA 139 135  K 3.5 4.6  CL 101 102  CO2 24 25  GLUCOSE 90 102*  BUN 5* 9  CREATININE 0.66 0.72  CALCIUM 9.6 8.0*    PT/INR:  Recent Labs    06/17/19 1419  LABPROT 12.6  INR 1.0   ABG    Component Value Date/Time   PHART 7.349 (L) 06/19/2019 0715   HCO3 24.1 06/19/2019 0715   ACIDBASEDEF 0.8 06/19/2019 0715   O2SAT 99.2 06/19/2019 0715   CBG (last 3)  No results for input(s): GLUCAP in the last 72 hours.  Meds Scheduled Meds: . acetaminophen  1,000 mg Oral Q6H   Or  . acetaminophen (TYLENOL) oral liquid 160 mg/5 mL  1,000 mg Oral Q6H  . bisacodyl  10 mg Oral Daily  .  Chlorhexidine Gluconate Cloth  6 each Topical Daily  . divalproex  250 mg Oral QHS  . enoxaparin (LOVENOX) injection  40 mg Subcutaneous Daily  . mometasone-formoterol  2 puff Inhalation BID  . pantoprazole  40 mg Oral QAC breakfast  . risperiDONE  2 mg Oral QHS  . senna-docusate  1 tablet Oral QHS   Continuous Infusions: . sodium chloride    . sodium chloride 100 mL/hr at 06/19/19 1856   PRN Meds:.Place/Maintain arterial line **AND** sodium chloride, albuterol, ketorolac, ondansetron (ZOFRAN) IV, oxyCODONE, traMADol  Xrays DG CHEST PORT 1 VIEW  Result Date: 06/20/2019 CLINICAL DATA:  Lobectomy EXAM: PORTABLE CHEST 1 VIEW COMPARISON:  Yesterday FINDINGS: Small right apical pneumothorax with chest tubes in place. Right IJ line with tip at the upper cavoatrial junction. Normal heart size. Atelectasis at the left base and likely at the right base. IMPRESSION: Stable postoperative chest including small right apical pneumothorax. Electronically Signed   By: Monte Fantasia M.D.   On: 06/20/2019 06:45   DG Chest Port 1 View  Result Date: 06/19/2019 CLINICAL DATA:  Follow-up lobectomy EXAM: PORTABLE CHEST 1 VIEW COMPARISON:  06/17/2019 FINDINGS: Right internal jugular central line tip is at the SVC RA junction. Two right chest tubes are in place. Small amount of pleural air at the apex. Mild atelectasis  at both lung bases. IMPRESSION: Status post lobectomy on the right. Two chest tubes in place. Small amount of pleural air at the right apex. Mild bibasilar atelectasis. Electronically Signed   By: Nelson Chimes M.D.   On: 06/19/2019 13:04    Assessment/Plan: S/P Procedure(s) (LRB): XI ROBOTIC ASSISTED THORASCOPY - RIGHT LOWER LOBE LOBECTOMY (Right) Intercostal Nerve Block (Right) Lymph Node Dissection (Right)  1 doing well 2 afeb , VSS 3 sats good on RA 4 CXR small apical space 5 CT with _ air leak, will leave both tubes for today, if persists will use pigtail for air-leak and remove other  in am 6 d/c aline and foley 7 reduce IVF 8 minor ABL anemia- monitor  9 increased leukocytosis, no fevers, will monitor clinically and trends 10 normal renal fxn   LOS: 1 day    John Giovanni PA-C  pager [phone] 06/20/2019

## 2019-06-20 NOTE — Plan of Care (Signed)
  Problem: Health Behavior/Discharge Planning: Goal: Ability to manage health-related needs will improve Outcome: Progressing   Problem: Clinical Measurements: Goal: Will remain free from infection Outcome: Progressing Goal: Diagnostic test results will improve Outcome: Progressing Goal: Respiratory complications will improve Outcome: Progressing   Problem: Activity: Goal: Risk for activity intolerance will decrease Outcome: Progressing   Problem: Coping: Goal: Level of anxiety will decrease Outcome: Progressing   Problem: Elimination: Goal: Will not experience complications related to bowel motility Outcome: Progressing Goal: Will not experience complications related to urinary retention Outcome: Progressing   Problem: Pain Managment: Goal: General experience of comfort will improve Outcome: Progressing   Problem: Safety: Goal: Ability to remain free from injury will improve Outcome: Progressing   Problem: Skin Integrity: Goal: Risk for impaired skin integrity will decrease Outcome: Progressing   Problem: Education: Goal: Knowledge of disease or condition will improve Outcome: Progressing Goal: Knowledge of the prescribed therapeutic regimen will improve Outcome: Progressing   Problem: Activity: Goal: Risk for activity intolerance will decrease Outcome: Progressing   Problem: Cardiac: Goal: Will achieve and/or maintain hemodynamic stability Outcome: Progressing   Problem: Clinical Measurements: Goal: Postoperative complications will be avoided or minimized Outcome: Progressing   Problem: Respiratory: Goal: Respiratory status will improve Outcome: Progressing   Problem: Pain Management: Goal: Pain level will decrease Outcome: Progressing   Problem: Skin Integrity: Goal: Wound healing without signs and symptoms infection will improve Outcome: Progressing

## 2019-06-21 ENCOUNTER — Inpatient Hospital Stay (HOSPITAL_COMMUNITY): Payer: Medicaid Other

## 2019-06-21 LAB — COMPREHENSIVE METABOLIC PANEL
ALT: 18 U/L (ref 0–44)
AST: 27 U/L (ref 15–41)
Albumin: 3.1 g/dL — ABNORMAL LOW (ref 3.5–5.0)
Alkaline Phosphatase: 46 U/L (ref 38–126)
Anion gap: 11 (ref 5–15)
BUN: 14 mg/dL (ref 6–20)
CO2: 25 mmol/L (ref 22–32)
Calcium: 8.5 mg/dL — ABNORMAL LOW (ref 8.9–10.3)
Chloride: 95 mmol/L — ABNORMAL LOW (ref 98–111)
Creatinine, Ser: 0.76 mg/dL (ref 0.44–1.00)
GFR calc Af Amer: >60 mL/min (ref >60)
GFR calc non Af Amer: >60 mL/min (ref >60)
Glucose, Bld: 150 mg/dL — ABNORMAL HIGH (ref 70–99)
Potassium: 4.6 mmol/L (ref 3.5–5.1)
Sodium: 131 mmol/L — ABNORMAL LOW (ref 135–145)
Total Bilirubin: 1 mg/dL (ref 0.3–1.2)
Total Protein: 6.2 g/dL — ABNORMAL LOW (ref 6.5–8.1)

## 2019-06-21 LAB — CBC
HCT: 30.9 % — ABNORMAL LOW (ref 36.0–46.0)
Hemoglobin: 10.2 g/dL — ABNORMAL LOW (ref 12.0–15.0)
MCH: 30.7 pg (ref 26.0–34.0)
MCHC: 33 g/dL (ref 30.0–36.0)
MCV: 93.1 fL (ref 80.0–100.0)
Platelets: 114 10*3/uL — ABNORMAL LOW (ref 150–400)
RBC: 3.32 MIL/uL — ABNORMAL LOW (ref 3.87–5.11)
RDW: 14 % (ref 11.5–15.5)
WBC: 13.1 10*3/uL — ABNORMAL HIGH (ref 4.0–10.5)
nRBC: 0 % (ref 0.0–0.2)

## 2019-06-21 NOTE — Progress Notes (Signed)
Ambulated along the hallway with RN with front wheel walker on room air.complained of shortness of breath  after few min. Assisted back to bed, became rude yelling at the RN while being helped to bed.

## 2019-06-21 NOTE — Progress Notes (Signed)
       [address]       [place] [address]             [phone]      2 Days Post-Op Procedure(s) (LRB): XI ROBOTIC ASSISTED THORASCOPY - RIGHT LOWER LOBE LOBECTOMY (Right) Intercostal Nerve Block (Right) Lymph Node Dissection (Right) Subjective: Sitting up eating lunch.  Having pleuritic pain around CT insertion sites.   Objective: Vital signs in last 24 hours: Temp:  [97.9 F (36.6 C)-98.8 F (37.1 C)] 98.8 F (37.1 C) (05/22 1118) Pulse Rate:  [82-96] 83 (05/22 1118) Cardiac Rhythm: Normal sinus rhythm (05/22 1200) Resp:  [12-19] 15 (05/22 1118) BP: (95-111)/(52-66) 95/58 (05/22 1118) SpO2:  [91 %-98 %] 96 % (05/22 1118)  Hemodynamic parameters for last 24 hours:    Intake/Output from previous day: 05/21 0701 - 05/22 0700 In: 1491.7 [P.O.:360; I.V.:1131.7] Out: 700 [Urine:400; Chest Tube:300] Intake/Output this shift: Total I/O In: 230 [P.O.:230] Out: 200 [Urine:200]  General appearance: alert, cooperative and mild distress Neurologic: intact Heart: regular rate and rhythm Lungs: Audible air leak on the right. Left lung clear.  Active air leak at PleurEvac. CT drainage 373ml/24 hours. The CXR shows the right apical PTX is slightly larger.  Wound: Incisions are intact and dry.  Lab Results: Recent Labs    06/20/19 0515 06/21/19 0334  WBC 14.6* 13.1*  HGB 11.5* 10.2*  HCT 35.7* 30.9*  PLT 147* 114*   BMET:  Recent Labs    06/20/19 0515 06/21/19 0334  NA 135 131*  K 4.6 4.6  CL 102 95*  CO2 25 25  GLUCOSE 102* 150*  BUN 9 14  CREATININE 0.72 0.76  CALCIUM 8.0* 8.5*    PT/INR: No results for input(s): LABPROT, INR in the last 72 hours. ABG    Component Value Date/Time   PHART 7.349 (L) 06/19/2019 0715   HCO3 24.1 06/19/2019 0715   ACIDBASEDEF 0.8 06/19/2019 0715   O2SAT 99.2 06/19/2019 0715   CBG (last 3)  No results for input(s): GLUCAP in the last 72 hours.  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  She required extensive lysis of adhesions.  She has a persistent air leak.  We will remove the right pleural Blake drain today and leave the pigtail catheter connected to the Pleur-evac.  Repeat chest x-ray tomorrow morning.  -Expected acute blood loss anemia-mild and well-tolerated.  Will monitor.  -Mild postoperative leukocytosis.  She is afebrile.  WBC trending down.  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  She is back on her albuterol and Symbicort-equivalent inhaler  -DVT prophylaxis-on enoxaparin daily   LOS: 2 days    Antony Odea, PA-C [phone] 06/21/2019

## 2019-06-21 NOTE — Progress Notes (Signed)
Patient walked down the hall using walker without any distress. Patient tolerated it well.

## 2019-06-21 NOTE — Progress Notes (Signed)
Encouraged to ambulate along the hallway but refused to pain on the surgical site.  Pain meds given. To try ambulation later.

## 2019-06-22 ENCOUNTER — Inpatient Hospital Stay (HOSPITAL_COMMUNITY): Payer: Medicaid Other

## 2019-06-22 MED ORDER — DIPHENHYDRAMINE HCL 50 MG/ML IJ SOLN
25.0000 mg | Freq: Once | INTRAMUSCULAR | Status: AC
  Administered 2019-06-22: 25 mg via INTRAVENOUS

## 2019-06-22 MED ORDER — DIPHENHYDRAMINE HCL 50 MG/ML IJ SOLN
INTRAMUSCULAR | Status: AC
  Filled 2019-06-22: qty 1

## 2019-06-22 MED ORDER — RACEPINEPHRINE HCL 2.25 % IN NEBU
0.5000 mL | INHALATION_SOLUTION | Freq: Once | RESPIRATORY_TRACT | Status: AC
  Administered 2019-06-22: 0.5 mL via RESPIRATORY_TRACT
  Filled 2019-06-22: qty 0.5

## 2019-06-22 MED ORDER — LIDOCAINE HCL (PF) 2 % IJ SOLN
20.0000 mL | Freq: Once | INTRAMUSCULAR | Status: AC
  Administered 2019-06-22: 20 mL via INTRADERMAL
  Filled 2019-06-22: qty 20

## 2019-06-22 NOTE — Progress Notes (Signed)
[address]       [place] [address]             [phone]       3 Days Post-Op Procedure(s) (LRB): XI ROBOTIC ASSISTED THORASCOPY - RIGHT LOWER LOBE LOBECTOMY (Right) Intercostal Nerve Block (Right) Lymph Node Dissection (Right) Subjective: Denies shortness of breath but is having continued pain around the chest tube and in around her right shoulder blade.  Objective: Vital signs in last 24 hours: Temp:  [98 F (36.7 C)-98.8 F (37.1 C)] 98.5 F (36.9 C) (05/23 0749) Pulse Rate:  [83-102] 101 (05/23 0749) Cardiac Rhythm: Normal sinus rhythm (05/22 1900) Resp:  [13-19] 16 (05/23 0749) BP: (95-119)/(58-75) 105/69 (05/23 0749) SpO2:  [90 %-97 %] 93 % (05/23 0749)    Intake/Output from previous day: 05/22 0701 - 05/23 0700 In: 1060 [P.O.:1060] Out: 1166 [Urine:1026; Chest Tube:140] Intake/Output this shift: No intake/output data recorded.  General appearance: alert, cooperative and mild distress Neurologic: intact Heart: regular rate and rhythm Lungs: Left lung clear. Pleural rub on the right.  Decrease in the air leak today. CT drainage 185ml/24 hours. The CXR shows the lateral and basilar components of the PTX have increased. And there is more right lateral subQ air. Wound: Incisions are intact and dry.  Lab Results: Recent Labs    06/20/19 0515 06/21/19 0334  WBC 14.6* 13.1*  HGB 11.5* 10.2*  HCT 35.7* 30.9*  PLT 147* 114*   BMET:  Recent Labs    06/20/19 0515 06/21/19 0334  NA 135 131*  K 4.6 4.6  CL 102 95*  CO2 25 25  GLUCOSE 102* 150*  BUN 9 14  CREATININE 0.72 0.76  CALCIUM 8.0* 8.5*    PT/INR: No results for input(s): LABPROT, INR in the last 72 hours. ABG    Component Value Date/Time   PHART 7.349 (L) 06/19/2019 0715   HCO3 24.1 06/19/2019 0715   ACIDBASEDEF 0.8 06/19/2019 0715   O2SAT 99.2 06/19/2019 0715   CBG (last 3)  No results for input(s): GLUCAP in the last 72 hours.   EXAM: PORTABLE CHEST 1  VIEW  COMPARISON: 06/21/2019  FINDINGS: Cardiac shadow is stable. Right jugular central line is again seen and stable. There is been interval removal of the large bore chest tube on the right. Slight increase in pneumothorax is noted on the right particularly in the basilar and lateral component. Left lung remains clear.  IMPRESSION: Increase in right-sided pneumothorax following large bore chest tube removal.   Electronically Signed By: Inez Catalina M.D. 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  She required extensive lysis of adhesions. The Blake drain was removed on POD-2 and the pigtail catheter was left on water seal. The left PTX has increased in lateral and left basilar components.  She has a persistent air leak but this has decreased.  Will place the pigtail catheter to -10cmH2O suction today.   Repeat chest x-ray tomorrow morning.  -Expected acute blood loss anemia-mild and well-tolerated.  Will monitor.  -Mild postoperative leukocytosis.  She is afebrile.  WBC trending down.  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  She is back on her albuterol and Symbicort-equivalent inhaler  -DVT prophylaxis-on enoxaparin daily   LOS: 3 days    Skii Cleland G.  Henriette Combs [phone] 06/22/2019

## 2019-06-22 NOTE — Significant Event (Signed)
Rapid Response Event Note  Overview: Facial and Neck Swelling  Initial Focused Assessment: Upon arrival, patient's entire face and neck had subcutaneous air, it was present on the RT side of her chest both anteriorly and posteriorly. Patient was awake and oriented, she endorsed being short of breath - RR was normal --good effort overall, 18-22 - 98% on 2L [place], she had a small bore-pigtail catheter in place but it was kinked and per staff it was constantly kinking. Lung sounds - RT quite diminished and LT side - clear breath sounds. Skin warm and dry. Patient was not in profound distress but was labored and short of breath.   CVTS MD came to the bedside and placed a new RT sided CT. After the nurse's administered the Benadryl - patient was confused - disoriented to place/time/situation. Patient follow commands and can be reoriented. VS: 119/81(92), HR 97, RR 18-22, 96% on 2L [place]  Interventions: -- STAT CXR -- Benadryl 25 mg IV x 1 -- Racemic Neb x 1 -- MD inserted RT sided CT - placed on - 20 cm suction - patient tolerated well overall.   Plan of Care: -- Keep HOB > 30 -- Have oxygen and suction at bedside -- High risk for needing intubation - until the subcutaneous air improves  -- Monitor VS -- Rest per MD  Event Summary:  Call Time 1419 Arrival Time 1427 End Time 1530   Laysa Kimmey R

## 2019-06-22 NOTE — Progress Notes (Signed)
@  1420 pt called RN that she felt swelling on her neck and face, unable to breathe. On Palpation RN felt rice crispy crackling under the subcutaneous tissues of neck and face. RR  called, Benadryl 25 mg IV given. Dr. Orvan Seen informed and chest tube inserted in bed side by Dr. Orvan Seen, pt tolerated well the procedure. Vitals maintained throughout the time. This time pt is resting comfortably in bed, in oxygen 2l via [place], pt's mentation is little off, she is asking where is she kind of questions, possible post benadryl effect. Will monitor her closely.  Palma Holter, RN

## 2019-06-22 NOTE — Plan of Care (Signed)
  Problem: Health Behavior/Discharge Planning: Goal: Ability to manage health-related needs will improve Outcome: Progressing   Problem: Clinical Measurements: Goal: Will remain free from infection Outcome: Progressing Goal: Diagnostic test results will improve Outcome: Progressing Goal: Respiratory complications will improve Outcome: Progressing   Problem: Activity: Goal: Risk for activity intolerance will decrease Outcome: Progressing   Problem: Coping: Goal: Level of anxiety will decrease Outcome: Progressing   Problem: Elimination: Goal: Will not experience complications related to bowel motility Outcome: Progressing Goal: Will not experience complications related to urinary retention Outcome: Progressing   Problem: Pain Managment: Goal: General experience of comfort will improve Outcome: Progressing   Problem: Safety: Goal: Ability to remain free from injury will improve Outcome: Progressing   Problem: Skin Integrity: Goal: Risk for impaired skin integrity will decrease Outcome: Progressing   Problem: Education: Goal: Knowledge of disease or condition will improve Outcome: Progressing Goal: Knowledge of the prescribed therapeutic regimen will improve Outcome: Progressing   Problem: Activity: Goal: Risk for activity intolerance will decrease Outcome: Progressing   Problem: Cardiac: Goal: Will achieve and/or maintain hemodynamic stability Outcome: Progressing   Problem: Clinical Measurements: Goal: Postoperative complications will be avoided or minimized Outcome: Progressing   Problem: Respiratory: Goal: Respiratory status will improve Outcome: Progressing   Problem: Pain Management: Goal: Pain level will decrease Outcome: Progressing   Problem: Skin Integrity: Goal: Wound healing without signs and symptoms infection will improve Outcome: Progressing

## 2019-06-22 NOTE — Procedures (Signed)
Chest Tube Insertion Procedure Note  Indications:  Clinically significant Pneumothorax  Pre-operative Diagnosis: Pneumothorax, right  Post-operative Diagnosis:same  Procedure Details  Informed consent was obtained for the procedure, including sedation.  Risks of lung perforation, hemorrhage, arrhythmia, and adverse drug reaction were discussed.   After sterile skin prep, using standard technique, a 28 French tube was placed in the right lateral 7th rib space.  Findings: Large rush of air  Estimated Blood Loss:  Minimal         Specimens:  None              Complications:  None; patient tolerated the procedure well.         Disposition: 2C         Condition: stable  Attending Attestation: I performed the procedure.  CXR shows acceptable tube position; there is tidaling in the pleur-evac

## 2019-06-22 NOTE — Significant Event (Signed)
Rapid Response Event Note  Overview: Follow Up   I came to see the patient around 1700, patient was awake but still confused - again easily reoriented, still follow commands and moves all extremities equally. Facial and neck swelling was about the same - no major changes - patient endorses that she is " breathing much better" now. VS 104/47(65), HR 109, RR 18, 96% on 2L [place].    PATEL, PUJA R

## 2019-06-23 ENCOUNTER — Inpatient Hospital Stay (HOSPITAL_COMMUNITY): Payer: Medicaid Other

## 2019-06-23 LAB — SURGICAL PATHOLOGY

## 2019-06-23 MED ORDER — DIPHENHYDRAMINE HCL 25 MG PO CAPS
25.0000 mg | ORAL_CAPSULE | Freq: Every evening | ORAL | Status: DC | PRN
  Administered 2019-06-23 – 2019-07-01 (×5): 25 mg via ORAL
  Filled 2019-06-23 (×5): qty 1

## 2019-06-23 NOTE — Progress Notes (Signed)
Patient ambulated 381ft in the hallway, tolerated well on 3L O2.

## 2019-06-23 NOTE — Plan of Care (Signed)
  Problem: Health Behavior/Discharge Planning: Goal: Ability to manage health-related needs will improve Outcome: Progressing   Problem: Clinical Measurements: Goal: Will remain free from infection Outcome: Progressing Goal: Diagnostic test results will improve Outcome: Progressing Goal: Respiratory complications will improve Outcome: Progressing   Problem: Activity: Goal: Risk for activity intolerance will decrease Outcome: Progressing   Problem: Coping: Goal: Level of anxiety will decrease Outcome: Progressing   Problem: Elimination: Goal: Will not experience complications related to bowel motility Outcome: Progressing Goal: Will not experience complications related to urinary retention Outcome: Progressing   Problem: Pain Managment: Goal: General experience of comfort will improve Outcome: Progressing   Problem: Safety: Goal: Ability to remain free from injury will improve Outcome: Progressing   Problem: Skin Integrity: Goal: Risk for impaired skin integrity will decrease Outcome: Progressing   Problem: Education: Goal: Knowledge of disease or condition will improve Outcome: Progressing Goal: Knowledge of the prescribed therapeutic regimen will improve Outcome: Progressing   Problem: Activity: Goal: Risk for activity intolerance will decrease Outcome: Progressing   Problem: Cardiac: Goal: Will achieve and/or maintain hemodynamic stability Outcome: Progressing   Problem: Clinical Measurements: Goal: Postoperative complications will be avoided or minimized Outcome: Progressing   Problem: Respiratory: Goal: Respiratory status will improve Outcome: Progressing   Problem: Pain Management: Goal: Pain level will decrease Outcome: Progressing   Problem: Skin Integrity: Goal: Wound healing without signs and symptoms infection will improve Outcome: Progressing

## 2019-06-23 NOTE — Progress Notes (Signed)
4 Days Post-Op Procedure(s) (LRB): XI ROBOTIC ASSISTED THORASCOPY - RIGHT LOWER LOBE LOBECTOMY (Right) Intercostal Nerve Block (Right) Lymph Node Dissection (Right) Subjective: Events of the weekend noted. Developed right pneumo with SQ emphysema after CT removal. CT replaced yesterday C/o pain at surgical site  Objective: Vital signs in last 24 hours: Temp:  [98 F (36.7 C)-98.5 F (36.9 C)] 98.4 F (36.9 C) (05/24 0400) Pulse Rate:  [97-116] 106 (05/24 0400) Cardiac Rhythm: Sinus tachycardia (05/24 0400) Resp:  [10-24] 18 (05/24 0400) BP: (97-134)/(47-96) 108/57 (05/24 0400) SpO2:  [93 %-98 %] 95 % (05/24 0729)  Hemodynamic parameters for last 24 hours:    Intake/Output from previous day: 05/23 0701 - 05/24 0700 In: 680 [P.O.:680] Out: 2334 [Urine:1200; Chest Tube:172] Intake/Output this shift: No intake/output data recorded.  General appearance: alert, cooperative and no distress Neurologic: intact Heart: mildly tachy,regular Lungs: rhonchi faint on right + air leak, + SQ emphysema  Lab Results: Recent Labs    06/21/19 0334  WBC 13.1*  HGB 10.2*  HCT 30.9*  PLT 114*   BMET:  Recent Labs    06/21/19 0334  NA 131*  K 4.6  CL 95*  CO2 25  GLUCOSE 150*  BUN 14  CREATININE 0.76  CALCIUM 8.5*    PT/INR: No results for input(s): LABPROT, INR in the last 72 hours. ABG    Component Value Date/Time   PHART 7.349 (L) 06/19/2019 0715   HCO3 24.1 06/19/2019 0715   ACIDBASEDEF 0.8 06/19/2019 0715   O2SAT 99.2 06/19/2019 0715   CBG (last 3)  No results for input(s): GLUCAP in the last 72 hours.  Assessment/Plan: S/P Procedure(s) (LRB): XI ROBOTIC ASSISTED THORASCOPY - RIGHT LOWER LOBE LOBECTOMY (Right) Intercostal Nerve Block (Right) Lymph Node Dissection (Right) -POD # 4  Still has an air leak from new chest tube, but not pigtail. Pigtail is either occluded or may have slipped into the fissure. Either way it is not functioning so will dc and continue  with tube that was placed yesterday to 10 cm of suction Ambulate SCD + enoxaparin for DVT prophylaxis    LOS: 4 days    [name] 06/23/2019

## 2019-06-24 ENCOUNTER — Inpatient Hospital Stay (HOSPITAL_COMMUNITY): Payer: Medicaid Other

## 2019-06-24 NOTE — Plan of Care (Signed)
  Problem: Health Behavior/Discharge Planning: Goal: Ability to manage health-related needs will improve Outcome: Progressing   Problem: Clinical Measurements: Goal: Will remain free from infection Outcome: Progressing Goal: Diagnostic test results will improve Outcome: Progressing Goal: Respiratory complications will improve Outcome: Progressing   Problem: Pain Managment: Goal: General experience of comfort will improve Outcome: Progressing

## 2019-06-24 NOTE — Progress Notes (Addendum)
[address]       [place] [address]             [phone]      5 Days Post-Op Procedure(s) (LRB): XI ROBOTIC ASSISTED THORASCOPY - RIGHT LOWER LOBE LOBECTOMY (Right) Intercostal Nerve Block (Right) Lymph Node Dissection (Right) Subjective: Some DOE, some surgical pain  Objective: Vital signs in last 24 hours: Temp:  [97.9 F (36.6 C)-98.3 F (36.8 C)] 98.2 F (36.8 C) (05/25 0414) Pulse Rate:  [94-110] 99 (05/25 0414) Cardiac Rhythm: Normal sinus rhythm (05/25 0414) Resp:  [14-18] 14 (05/25 0414) BP: (104-122)/(53-72) 105/53 (05/25 0414) SpO2:  [90 %-97 %] 90 % (05/25 0715)  Hemodynamic parameters for last 24 hours:    Intake/Output from previous day: 05/24 0701 - 05/25 0700 In: 240 [P.O.:240] Out: 190 [Chest Tube:190] Intake/Output this shift: No intake/output data recorded.  General appearance: alert, cooperative, fatigued and no distress Heart: regular rate and rhythm Lungs: coarse with scattered crackles on right, left is clear  Abdomen: benign Extremities: no edema, non-tender Wound: incis healing well  Lab Results: No results for input(s): WBC, HGB, HCT, PLT in the last 72 hours. BMET: No results for input(s): NA, K, CL, CO2, GLUCOSE, BUN, CREATININE, CALCIUM in the last 72 hours.  PT/INR: No results for input(s): LABPROT, INR in the last 72 hours. ABG    Component Value Date/Time   PHART 7.349 (L) 06/19/2019 0715   HCO3 24.1 06/19/2019 0715   ACIDBASEDEF 0.8 06/19/2019 0715   O2SAT 99.2 06/19/2019 0715   CBG (last 3)  No results for input(s): GLUCAP in the last 72 hours.  Meds Scheduled Meds: . acetaminophen  1,000 mg Oral Q6H   Or  . acetaminophen (TYLENOL) oral liquid 160 mg/5 mL  1,000 mg Oral Q6H  . bisacodyl  10 mg Oral Daily  . Chlorhexidine Gluconate Cloth  6 each Topical Daily  . divalproex  250 mg Oral QHS  . enoxaparin (LOVENOX) injection  40 mg Subcutaneous Daily  . mometasone-formoterol  2 puff  Inhalation BID  . pantoprazole  40 mg Oral QAC breakfast  . risperiDONE  2 mg Oral QHS  . senna-docusate  1 tablet Oral QHS   Continuous Infusions: . sodium chloride 10 mL/hr at 06/21/19 0410   PRN Meds:.albuterol, diphenhydrAMINE, ondansetron (ZOFRAN) IV, oxyCODONE, traMADol  Xrays DG CHEST PORT 1 VIEW  Result Date: 06/23/2019 CLINICAL DATA:  pneumothorax, chest tube EXAM: PORTABLE CHEST 1 VIEW COMPARISON:  06/22/2019 FINDINGS: Right chest tubes remain in place. Small right apical pneumothorax noted, stable. Extensive subcutaneous emphysema throughout the chest wall. Patchy airspace disease in the right mid and lower lung. Left lung clear. IMPRESSION: Right chest tubes remain in place with small residual right apical pneumothorax. Extensive bilateral subcutaneous emphysema. Patchy right mid and lower lung airspace opacities. Electronically Signed   By: Rolm Baptise M.D.   On: 06/23/2019 07:35   DG CHEST PORT 1 VIEW  Result Date: 06/22/2019 CLINICAL DATA:  Status post chest tube placement. EXAM: PORTABLE CHEST 1 VIEW COMPARISON:  Jun 22, 2019 (5:58 a.m.). FINDINGS: There is stable right-sided chest tube positioning. An additional right-sided chest tube has been placed, with its distal tip seen overlying the medial aspect of the right lung base. Mild, stable airspace disease is seen within the right lung base. There is no evidence of a pleural effusion or pneumothorax. The heart size and mediastinal contours are within normal limits. An extensive amount of subcutaneous emphysema is  seen throughout the right chest wall and bilateral neck soft tissues. This is increased in severity when compared to the prior study. The visualized skeletal structures are unremarkable. IMPRESSION: 1. Interval placement of an additional right-sided chest tube. 2. Mild, stable right basilar airspace disease. 3. Extensive amount of subcutaneous emphysema, as described above. 4. Interval resolution of the right-sided  pneumothorax seen on the prior study. Electronically Signed   By: Virgina Norfolk M.D.   On: 06/22/2019 15:32    Assessment/Plan: S/P Procedure(s) (LRB): XI ROBOTIC ASSISTED THORASCOPY - RIGHT LOWER LOBE LOBECTOMY (Right) Intercostal Nerve Block (Right) Lymph Node Dissection (Right)  1 VSS, afebrile 2 sats ok on 0-3 liters at different times throughout last 24 hours 3 CT without air leak on 10 cm H2O suction 4 CXR stable space, + SQ  Air with similar appearance 5 no new labs 6 conts current management , push pulm toilet/rehab as able   LOS: 5 days    John Giovanni PA-C Pager [phone] 06/24/2019 Patient seen and examined. Her CT was kinked and when unkinked there was a large rush of air. Still has an air leak. I discussed the possibility of bronch for IBV placement with her. She understands the indications, risks, benefits and alternatives. She is agreeable to IBV placement should it be necessary.  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 Will need adjuvant chemoradiation.  Ambulating well  Revonda Standard. Roxan Hockey, MD [facility] [phone]

## 2019-06-25 ENCOUNTER — Inpatient Hospital Stay (HOSPITAL_COMMUNITY): Payer: Medicaid Other

## 2019-06-25 NOTE — Plan of Care (Signed)
  Problem: Health Behavior/Discharge Planning: Goal: Ability to manage health-related needs will improve Outcome: Progressing   Problem: Clinical Measurements: Goal: Will remain free from infection Outcome: Progressing   Problem: Activity: Goal: Risk for activity intolerance will decrease Outcome: Progressing   Problem: Coping: Goal: Level of anxiety will decrease Outcome: Progressing

## 2019-06-25 NOTE — Progress Notes (Signed)
6 Days Post-Op Procedure(s) (LRB): XI ROBOTIC ASSISTED THORASCOPY - RIGHT LOWER LOBE LOBECTOMY (Right) Intercostal Nerve Block (Right) Lymph Node Dissection (Right) Subjective: C/o pain from tube  Objective: Vital signs in last 24 hours: Temp:  [97.6 F (36.4 C)-98.6 F (37 C)] 98.1 F (36.7 C) (05/26 0726) Pulse Rate:  [85-99] 99 (05/26 0726) Cardiac Rhythm: Sinus tachycardia (05/26 0714) Resp:  [10-14] 14 (05/26 0403) BP: (100-120)/(51-68) 104/65 (05/26 0726) SpO2:  [93 %-99 %] 93 % (05/26 0713)  Hemodynamic parameters for last 24 hours:    Intake/Output from previous day: 05/25 0701 - 05/26 0700 In: 480 [P.O.:480] Out: 130 [Chest Tube:130] Intake/Output this shift: No intake/output data recorded.  General appearance: alert, cooperative and no distress Neurologic: intact Heart: regular rate and rhythm Lungs: rhonchi on right Wound: clean and dry + air leak  Lab Results: No results for input(s): WBC, HGB, HCT, PLT in the last 72 hours. BMET: No results for input(s): NA, K, CL, CO2, GLUCOSE, BUN, CREATININE, CALCIUM in the last 72 hours.  PT/INR: No results for input(s): LABPROT, INR in the last 72 hours. ABG    Component Value Date/Time   PHART 7.349 (L) 06/19/2019 0715   HCO3 24.1 06/19/2019 0715   ACIDBASEDEF 0.8 06/19/2019 0715   O2SAT 99.2 06/19/2019 0715   CBG (last 3)  No results for input(s): GLUCAP in the last 72 hours.  Assessment/Plan: S/P Procedure(s) (LRB): XI ROBOTIC ASSISTED THORASCOPY - RIGHT LOWER LOBE LOBECTOMY (Right) Intercostal Nerve Block (Right) Lymph Node Dissection (Right) - Still has an air leak. Appears slightly decreased from yesterday but still significant. Will plan for IBV placement tomorrow unless there is a major decrease in the leak Will also plan to replace Ct to a more anterior location less prone to kinking    LOS: 6 days    Rachel Gross 06/25/2019

## 2019-06-25 NOTE — TOC Progression Note (Signed)
Transition of Care [facility]) - Progression Note    Patient Details  Name: Rachel Gross MRN: [id number] Date of Birth: 04-07-1965  Transition of [facility]) CM/SW Contact  Zenon Mayo, RN Phone Number: 06/25/2019, 3:31 PM  Clinical Narrative:    Patient is from home s/p robotic lobectomy, chst tube with air leak,  plan for bronch IBV placement tomorrow. Toc will cont to follow for dc needs.         Expected Discharge Plan and Services                                                 Social Determinants of Health (SDOH) Interventions    Readmission Risk Interventions No flowsheet data found.

## 2019-06-26 ENCOUNTER — Inpatient Hospital Stay (HOSPITAL_COMMUNITY): Payer: Medicaid Other | Admitting: Certified Registered"

## 2019-06-26 ENCOUNTER — Inpatient Hospital Stay (HOSPITAL_COMMUNITY): Payer: Medicaid Other

## 2019-06-26 ENCOUNTER — Encounter (HOSPITAL_COMMUNITY)
Admission: RE | Disposition: A | Discharge status: Home / Self Care | Payer: Self-pay | Source: Home / Self Care | Attending: Thoracic Surgery (Cardiothoracic Vascular Surgery)

## 2019-06-26 ENCOUNTER — Encounter (HOSPITAL_COMMUNITY): Payer: Self-pay | Admitting: Thoracic Surgery (Cardiothoracic Vascular Surgery)

## 2019-06-26 DIAGNOSIS — J95812 Postprocedural air leak: Secondary | ICD-10-CM

## 2019-06-26 HISTORY — PX: VIDEO BRONCHOSCOPY WITH INSERTION OF INTERBRONCHIAL VALVE (IBV): SHX6178

## 2019-06-26 HISTORY — PX: CHEST TUBE INSERTION: SHX231

## 2019-06-26 SURGERY — BRONCHOSCOPY, FLEXIBLE, WITH INTRABRONCHIAL VALVE INSERTION
Anesthesia: General | Site: Chest | Laterality: Right

## 2019-06-26 MED ORDER — LIDOCAINE 2% (20 MG/ML) 5 ML SYRINGE
INTRAMUSCULAR | Status: DC | PRN
  Administered 2019-06-26: 100 mg via INTRAVENOUS

## 2019-06-26 MED ORDER — GLYCOPYRROLATE PF 0.2 MG/ML IJ SOSY
PREFILLED_SYRINGE | INTRAMUSCULAR | Status: AC
  Filled 2019-06-26: qty 1

## 2019-06-26 MED ORDER — 0.9 % SODIUM CHLORIDE (POUR BTL) OPTIME
TOPICAL | Status: DC | PRN
  Administered 2019-06-26: 1000 mL

## 2019-06-26 MED ORDER — DEXAMETHASONE SODIUM PHOSPHATE 10 MG/ML IJ SOLN
INTRAMUSCULAR | Status: DC | PRN
  Administered 2019-06-26: 10 mg via INTRAVENOUS

## 2019-06-26 MED ORDER — ONDANSETRON HCL 4 MG/2ML IJ SOLN
INTRAMUSCULAR | Status: AC
  Filled 2019-06-26: qty 2

## 2019-06-26 MED ORDER — PROPOFOL 10 MG/ML IV BOLUS
INTRAVENOUS | Status: AC
  Filled 2019-06-26: qty 20

## 2019-06-26 MED ORDER — CHLORHEXIDINE GLUCONATE 0.12 % MT SOLN
OROMUCOSAL | Status: AC
  Administered 2019-06-26: 15 mL via OROMUCOSAL
  Filled 2019-06-26: qty 15

## 2019-06-26 MED ORDER — ROCURONIUM BROMIDE 10 MG/ML (PF) SYRINGE
PREFILLED_SYRINGE | INTRAVENOUS | Status: AC
  Filled 2019-06-26: qty 10

## 2019-06-26 MED ORDER — LABETALOL HCL 5 MG/ML IV SOLN
INTRAVENOUS | Status: AC
  Filled 2019-06-26: qty 4

## 2019-06-26 MED ORDER — MIDAZOLAM HCL 2 MG/2ML IJ SOLN
INTRAMUSCULAR | Status: AC
  Filled 2019-06-26: qty 2

## 2019-06-26 MED ORDER — ORAL CARE MOUTH RINSE: 15.0000 mL | Freq: Once | OROMUCOSAL | Status: AC

## 2019-06-26 MED ORDER — FENTANYL CITRATE (PF) 100 MCG/2ML IJ SOLN
INTRAMUSCULAR | Status: DC | PRN
  Administered 2019-06-26: 100 ug via INTRAVENOUS
  Administered 2019-06-26 (×2): 50 ug via INTRAVENOUS

## 2019-06-26 MED ORDER — ONDANSETRON HCL 4 MG/2ML IJ SOLN
INTRAMUSCULAR | Status: DC | PRN
  Administered 2019-06-26: 4 mg via INTRAVENOUS

## 2019-06-26 MED ORDER — CHLORHEXIDINE GLUCONATE 0.12 % MT SOLN: 15.0000 mL | Freq: Once | OROMUCOSAL | Status: AC

## 2019-06-26 MED ORDER — LACTATED RINGERS IV SOLN: INTRAVENOUS | Status: DC

## 2019-06-26 MED ORDER — LIDOCAINE 2% (20 MG/ML) 5 ML SYRINGE
INTRAMUSCULAR | Status: AC
  Filled 2019-06-26: qty 5

## 2019-06-26 MED ORDER — PHENYLEPHRINE 40 MCG/ML (10ML) SYRINGE FOR IV PUSH (FOR BLOOD PRESSURE SUPPORT)
PREFILLED_SYRINGE | INTRAVENOUS | Status: AC
  Filled 2019-06-26: qty 10

## 2019-06-26 MED ORDER — ROCURONIUM BROMIDE 10 MG/ML (PF) SYRINGE
PREFILLED_SYRINGE | INTRAVENOUS | Status: DC | PRN
  Administered 2019-06-26: 60 mg via INTRAVENOUS

## 2019-06-26 MED ORDER — LACTATED RINGERS IV SOLN: INTRAVENOUS | Status: DC | PRN

## 2019-06-26 MED ORDER — DEXAMETHASONE SODIUM PHOSPHATE 10 MG/ML IJ SOLN
INTRAMUSCULAR | Status: AC
  Filled 2019-06-26: qty 1

## 2019-06-26 MED ORDER — MIDAZOLAM HCL 5 MG/5ML IJ SOLN
INTRAMUSCULAR | Status: DC | PRN
  Administered 2019-06-26 (×2): 1 mg via INTRAVENOUS

## 2019-06-26 MED ORDER — FENTANYL CITRATE (PF) 250 MCG/5ML IJ SOLN
INTRAMUSCULAR | Status: AC
  Filled 2019-06-26: qty 5

## 2019-06-26 MED ORDER — PHENYLEPHRINE HCL-NACL 10-0.9 MG/250ML-% IV SOLN
INTRAVENOUS | Status: DC | PRN
  Administered 2019-06-26: 60 ug/min via INTRAVENOUS

## 2019-06-26 MED ORDER — GLYCOPYRROLATE PF 0.2 MG/ML IJ SOSY
PREFILLED_SYRINGE | INTRAMUSCULAR | Status: DC | PRN
  Administered 2019-06-26: .1 mg via INTRAVENOUS

## 2019-06-26 MED ORDER — SUGAMMADEX SODIUM 200 MG/2ML IV SOLN
INTRAVENOUS | Status: DC | PRN
  Administered 2019-06-26: 200 mg via INTRAVENOUS

## 2019-06-26 MED ORDER — PROPOFOL 10 MG/ML IV BOLUS
INTRAVENOUS | Status: DC | PRN
  Administered 2019-06-26: 100 mg via INTRAVENOUS

## 2019-06-26 MED ORDER — PHENYLEPHRINE 40 MCG/ML (10ML) SYRINGE FOR IV PUSH (FOR BLOOD PRESSURE SUPPORT)
PREFILLED_SYRINGE | INTRAVENOUS | Status: DC | PRN
  Administered 2019-06-26: 80 ug via INTRAVENOUS

## 2019-06-26 SURGICAL SUPPLY — 47 items
ADAPTER VALVE BIOPSY EBUS (MISCELLANEOUS) IMPLANT
ADPTR VALVE BIOPSY EBUS (MISCELLANEOUS)
BLADE 10 SAFETY STRL DISP (BLADE) ×3 IMPLANT
BLADE CLIPPER SURG (BLADE) ×3 IMPLANT
CANISTER SUCT 3000ML PPV (MISCELLANEOUS) ×3 IMPLANT
CATH BALLN 4FR (CATHETERS) ×3 IMPLANT
CATH EMB 5FR 80CM (CATHETERS) ×3 IMPLANT
CATH LOADER DEPLOYMENT HUD (CATHETERS) ×3 IMPLANT
CATH THORACIC 28FR (CATHETERS) ×3 IMPLANT
CNTNR URN SCR LID CUP LEK RST (MISCELLANEOUS) ×2 IMPLANT
CONT SPEC 4OZ STRL OR WHT (MISCELLANEOUS) ×1
COVER BACK TABLE 60X90IN (DRAPES) ×3 IMPLANT
FILTER STRAW FLUID ASPIR (MISCELLANEOUS) IMPLANT
FORCEPS BIOP RJ4 1.8 (CUTTING FORCEPS) IMPLANT
GAUZE SPONGE 4X4 12PLY STRL (GAUZE/BANDAGES/DRESSINGS) ×3 IMPLANT
GAUZE SPONGE 4X4 12PLY STRL LF (GAUZE/BANDAGES/DRESSINGS) ×3 IMPLANT
GLOVE SURG SIGNA 7.5 PF LTX (GLOVE) ×3 IMPLANT
GOWN STRL REUS W/ TWL XL LVL3 (GOWN DISPOSABLE) ×2 IMPLANT
GOWN STRL REUS W/TWL XL LVL3 (GOWN DISPOSABLE) ×1
KIT AIRWAY SIZING HUD (KITS) ×3 IMPLANT
KIT CLEAN ENDO COMPLIANCE (KITS) ×3 IMPLANT
KIT TURNOVER KIT B (KITS) ×3 IMPLANT
MARKER SKIN DUAL TIP RULER LAB (MISCELLANEOUS) ×3 IMPLANT
NS IRRIG 1000ML POUR BTL (IV SOLUTION) ×3 IMPLANT
OIL SILICONE PENTAX (PARTS (SERVICE/REPAIRS)) IMPLANT
PAD ARMBOARD 7.5X6 YLW CONV (MISCELLANEOUS) ×6 IMPLANT
STOPCOCK 3 WAY HIGH PRESSURE (MISCELLANEOUS) ×1
STOPCOCK 3WAY HIGH PRESSURE (MISCELLANEOUS) ×2 IMPLANT
STOPCOCK MORSE 400PSI 3WAY (MISCELLANEOUS) ×3 IMPLANT
SUT SILK  1 MH (SUTURE) ×3
SUT SILK 1 MH (SUTURE) ×6 IMPLANT
SYR 10ML LL (SYRINGE) ×3 IMPLANT
SYR 20ML ECCENTRIC (SYRINGE) ×3 IMPLANT
SYSTEM SAHARA CHEST DRAIN ATS (WOUND CARE) ×3 IMPLANT
TAPE CLOTH 4X10 WHT NS (GAUZE/BANDAGES/DRESSINGS) ×3 IMPLANT
TOWEL GREEN STERILE (TOWEL DISPOSABLE) ×3 IMPLANT
TOWEL GREEN STERILE FF (TOWEL DISPOSABLE) ×3 IMPLANT
TOWEL NATURAL 4PK STERILE (DISPOSABLE) ×3 IMPLANT
TRAP SPECIMEN MUCUS 40CC (MISCELLANEOUS) ×3 IMPLANT
TUBE CONNECTING 12X1/4 (SUCTIONS) ×3 IMPLANT
TUBE CONNECTING 20X1/4 (TUBING) ×3 IMPLANT
UNDERPAD 30X36 HEAVY ABSORB (UNDERPADS AND DIAPERS) ×3 IMPLANT
VALVE BIOPSY  SINGLE USE (MISCELLANEOUS) ×1
VALVE BIOPSY SINGLE USE (MISCELLANEOUS) ×2 IMPLANT
VALVE IN CARTRIDGE 7MM HUD (Valve) ×6 IMPLANT
VALVE SUCTION BRONCHIO DISP (MISCELLANEOUS) ×3 IMPLANT
WATER STERILE IRR 1000ML POUR (IV SOLUTION) ×3 IMPLANT

## 2019-06-26 NOTE — Transfer of Care (Addendum)
Immediate Anesthesia Transfer of Care Note  Patient: Rachel Gross  Procedure(s) Performed: VIDEO BRONCHOSCOPY WITH INSERTION OF TWO INTERBRONCHIAL VALVE (IBV) (N/A ) Right CHEST TUBE REPLACEMENT (Right Chest)  Patient Location: PACU  Anesthesia Type:General  Level of Consciousness: drowsy and patient cooperative  Airway & Oxygen Therapy: Patient Spontanous Breathing and Patient connected to face mask oxygen  Post-op Assessment: Report given to RN and Post -op Vital signs reviewed and stable  Post vital signs: Reviewed and stable  Last Vitals:  Vitals Value Taken Time  BP 114/59 06/26/19 1417  Temp 37.7 C 06/26/19 1317  Pulse 101 06/26/19 1418  Resp 16 06/26/19 1418  SpO2 94 % 06/26/19 1418  Vitals shown include unvalidated device data.  Last Pain:  Vitals:   06/26/19 1347  TempSrc:   PainSc: Asleep      Patients Stated Pain Goal: 0 (82/50/53 9767)  Complications: No apparent anesthesia complications

## 2019-06-26 NOTE — Op Note (Signed)
NAME: Rachel Gross, Rachel Gross MEDICAL RECORD [id number] ACCOUNT [id number] DATE OF BIRTH:[birth year] FACILITY: [facility] LOCATION: [facility] PHYSICIAN:Kaion Tisdale Chaya Jan, MD  OPERATIVE REPORT  DATE OF PROCEDURE:  06/26/2019  PREOPERATIVE DIAGNOSIS:  Persistent air leak after lobectomy.  POSTOPERATIVE DIAGNOSIS:  Persistent air leak after lobectomy.  PROCEDURE:   1.  Bronchoscopy for intrabronchial valve placement x 2.  2.  Replacement of chest tube.  SURGEON:  Modesto Charon, MD  ASSISTANT:  None.  ANESTHESIA:  General.  FINDINGS:  No change in air leak with middle lobe occlusion.  Lower lobe bronchial stump well healed.  Markedly decreased air leak with upper lobe occlusion.  Near complete resolution of air leak at completion of procedure.  CLINICAL NOTE:  Rachel Gross is a 54 year old woman with a history of tobacco abuse who recently had undergone right lower lobectomy for resection of the lung cancer.  She has had a persistent air leak since surgery.  She was advised to undergo  bronchoscopy with endobronchial valve placement and also replacement of the chest tube in a more anterior location to avoid kinking and discomfort.  The indications, risks, benefits, and alternatives were discussed in detail with the patient.  She  understood and accepted the risks and agreed to proceed.  OPERATIVE NOTE:  Rachel Gross was brought to the operating room on 06/26/2019.  She had induction of general anesthesia and was intubated.  A timeout was performed.  Flexible fiberoptic bronchoscopy was performed via the endotracheal tube.  The right lower lobe bronchial stump was well healed.  There were some mild clear secretions in the upper and middle lobes, also some mild secretions on  the left side.  There were no purulent secretions.  A 5 Fogarty catheter was inserted through the bronchoscope and used to occlude the middle lobe bronchus.  There was no effect on the air leak. So it then was directed  to the upper lobe bronchus and with  occlusion of the bronchus over the next few minutes, there was near complete resolution of the air leak.  Decision was made to occlude the right upper lobe bronchi.  The sizing balloon was placed and there were only 2 segmental bronchi arising from the  right upper lobe bronchus, 1 more superiorly and 1 more inferiorly, both sized for a 7 mm valve.  The valve was placed in the more inferior bronchus first and it was seated with good alignment.  Placement of the valve in the more apical bronchus was more  difficult, but ultimately the valve was deployed.  There was some overlap of the other valve, but there did appear to be complete seal on the bronchus.  The air leak was markedly diminished.  An incision was made just below the right inframammary crease and the chest was bluntly entered using a hemostat.  A fingertip was inserted and the lung was palpable.  A chest tube was inserted and some resistance was met.  Fluoroscopy was used to  visualize the tube.  Only spot films were used.  The tube was noted to be relatively inferior and kinked.  The tube was repositioned and ultimately was in a position similar to her previous tube, but from a more anterior direction.  The existing chest  tube was removed and the site was closed with a silk suture.  The new chest tube was secured with a #1 silk suture.  It was placed to a Pleur-evac on suction and after an initial small air leak that resolved within several minutes, then  there was no air  leak present.  The patient was extubated in the operating room and taken to the [facility] [facility] in good condition.  VN/NUANCE  D:06/26/2019 T:06/26/2019 JOB:[id number]

## 2019-06-26 NOTE — Anesthesia Procedure Notes (Signed)
Procedure Name: Intubation Date/Time: 06/26/2019 11:49 AM Performed by: Orlie Dakin, CRNA Pre-anesthesia Checklist: Patient identified, Emergency Drugs available, Suction available and Patient being monitored Patient Re-evaluated:Patient Re-evaluated prior to induction Oxygen Delivery Method: Circle system utilized Preoxygenation: Pre-oxygenation with 100% oxygen Induction Type: IV induction Ventilation: Mask ventilation without difficulty Laryngoscope Size: Miller and 3 Grade View: Grade I Tube type: Oral Tube size: 9.0 mm Number of attempts: 1 Airway Equipment and Method: Stylet Placement Confirmation: ETT inserted through vocal cords under direct vision,  positive ETCO2 and breath sounds checked- equal and bilateral Secured at: 23 cm Tube secured with: Tape Dental Injury: Teeth and Oropharynx as per pre-operative assessment

## 2019-06-26 NOTE — Brief Op Note (Signed)
06/26/2019  2:28 PM  PATIENT:  Rachel Gross  54 y.o. female  PRE-OPERATIVE DIAGNOSIS:  PERSISTENT AIRLEAK  POST-OPERATIVE DIAGNOSIS: PERSISTENT  AIRLEAK  PROCEDURE:  Procedure(s): VIDEO BRONCHOSCOPY WITH INSERTION OF TWO INTERBRONCHIAL VALVE (IBV) (N/A) Right CHEST TUBE REPLACEMENT (Right)  SURGEON:  Surgeon(s) and Role:    * [name], MD - Primary  PHYSICIAN ASSISTANT:   ASSISTANTS: none   ANESTHESIA:   general  EBL:  minimal  BLOOD ADMINISTERED:none  DRAINS: 1 Chest Tube(s) in the R   LOCAL MEDICATIONS USED:  NONE  SPECIMEN:  Source of Specimen:  [place]  DISPOSITION OF SPECIMEN:  Micro  COUNTS: OK  TOURNIQUET:  * No tourniquets in log *  DICTATION: .Other Dictation: Dictation Number -  PLAN OF CARE: Admit to inpatient   PATIENT DISPOSITION:  PACU - hemodynamically stable.   Delay start of Pharmacological VTE agent (>24hrs) due to surgical blood loss or risk of bleeding: no

## 2019-06-26 NOTE — Anesthesia Postprocedure Evaluation (Signed)
Anesthesia Post Note  Patient: Medical laboratory scientific officer  Procedure(s) Performed: VIDEO BRONCHOSCOPY WITH INSERTION OF TWO INTERBRONCHIAL VALVE (IBV) (N/A ) Right CHEST TUBE REPLACEMENT (Right Chest)     Patient location during evaluation: PACU Anesthesia Type: General Level of consciousness: awake and alert Pain management: pain level controlled Vital Signs Assessment: post-procedure vital signs reviewed and stable Respiratory status: spontaneous breathing, nonlabored ventilation, respiratory function stable and patient connected to nasal cannula oxygen Cardiovascular status: blood pressure returned to baseline and stable Postop Assessment: no apparent nausea or vomiting Anesthetic complications: no    Last Vitals:  Vitals:   06/26/19 1415 06/26/19 1434  BP: (!) 114/59 111/62  Pulse: (!) 102 99  Resp: 15 17  Temp:  36.8 C  SpO2: 94% 97%    Last Pain:  Vitals:   06/26/19 1434  TempSrc: Oral  PainSc: 0-No pain                 Angelyse Heslin DAVID

## 2019-06-26 NOTE — Anesthesia Preprocedure Evaluation (Signed)
Anesthesia Evaluation  Patient identified by MRN, date of birth, ID band Patient awake    Reviewed: Allergy & Precautions, NPO status , Patient's Chart, lab work & pertinent test results  Airway Mallampati: I  TM Distance: >3 FB Neck ROM: Full    Dental   Pulmonary COPD, Current Smoker and Patient abstained from smoking.,    Pulmonary exam normal        Cardiovascular hypertension, Pt. on medications Normal cardiovascular exam     Neuro/Psych Anxiety Depression Bipolar Disorder    GI/Hepatic GERD  Medicated and Controlled,(+) Hepatitis -, C  Endo/Other    Renal/GU      Musculoskeletal   Abdominal   Peds  Hematology   Anesthesia Other Findings   Reproductive/Obstetrics                             Anesthesia Physical Anesthesia Plan  ASA: III  Anesthesia Plan: General   Post-op Pain Management:    Induction: Intravenous  PONV Risk Score and Plan: 2 and Ondansetron and Midazolam  Airway Management Planned: Oral ETT  Additional Equipment:   Intra-op Plan:   Post-operative Plan: Extubation in OR  Informed Consent: I have reviewed the patients History and Physical, chart, labs and discussed the procedure including the risks, benefits and alternatives for the proposed anesthesia with the patient or authorized representative who has indicated his/her understanding and acceptance.       Plan Discussed with: CRNA and Surgeon  Anesthesia Plan Comments:         Anesthesia Quick Evaluation

## 2019-06-26 NOTE — Plan of Care (Signed)
  Problem: Health Behavior/Discharge Planning: Goal: Ability to manage health-related needs will improve Outcome: Progressing   Problem: Clinical Measurements: Goal: Will remain free from infection Outcome: Progressing Goal: Diagnostic test results will improve Outcome: Progressing Goal: Respiratory complications will improve Outcome: Progressing   Problem: Activity: Goal: Risk for activity intolerance will decrease Outcome: Progressing   Problem: Coping: Goal: Level of anxiety will decrease Outcome: Progressing   Problem: Elimination: Goal: Will not experience complications related to bowel motility Outcome: Progressing Goal: Will not experience complications related to urinary retention Outcome: Progressing   Problem: Pain Managment: Goal: General experience of comfort will improve Outcome: Progressing   Problem: Safety: Goal: Ability to remain free from injury will improve Outcome: Progressing   Problem: Skin Integrity: Goal: Risk for impaired skin integrity will decrease Outcome: Progressing   Problem: Education: Goal: Knowledge of disease or condition will improve Outcome: Progressing Goal: Knowledge of the prescribed therapeutic regimen will improve Outcome: Progressing   Problem: Activity: Goal: Risk for activity intolerance will decrease Outcome: Progressing   Problem: Cardiac: Goal: Will achieve and/or maintain hemodynamic stability Outcome: Progressing   Problem: Clinical Measurements: Goal: Postoperative complications will be avoided or minimized Outcome: Progressing   Problem: Respiratory: Goal: Respiratory status will improve Outcome: Progressing   Problem: Pain Management: Goal: Pain level will decrease Outcome: Progressing   Problem: Skin Integrity: Goal: Wound healing without signs and symptoms infection will improve Outcome: Progressing

## 2019-06-26 NOTE — Progress Notes (Signed)
7 Days Post-Op Procedure(s) (LRB): XI ROBOTIC ASSISTED THORASCOPY - RIGHT LOWER LOBE LOBECTOMY (Right) Intercostal Nerve Block (Right) Lymph Node Dissection (Right) Subjective: hungry  Objective: Vital signs in last 24 hours: Temp:  [98 F (36.7 C)-98.6 F (37 C)] 98.2 F (36.8 C) (05/27 0403) Pulse Rate:  [90-97] 94 (05/27 0403) Cardiac Rhythm: Normal sinus rhythm (05/27 0726) Resp:  [12-20] 12 (05/27 0403) BP: (103-114)/(53-64) 109/58 (05/27 0403) SpO2:  [95 %-99 %] 98 % (05/27 0403)  Hemodynamic parameters for last 24 hours:    Intake/Output from previous day: 05/26 0701 - 05/27 0700 In: 480 [P.O.:480] Out: 900 [Urine:800; Chest Tube:100] Intake/Output this shift: No intake/output data recorded.  General appearance: alert, cooperative and no distress Neurologic: intact Heart: regular rate and rhythm Lungs: CT sounds on R + air leak  Lab Results: No results for input(s): WBC, HGB, HCT, PLT in the last 72 hours. BMET: No results for input(s): NA, K, CL, CO2, GLUCOSE, BUN, CREATININE, CALCIUM in the last 72 hours.  PT/INR: No results for input(s): LABPROT, INR in the last 72 hours. ABG    Component Value Date/Time   PHART 7.349 (L) 06/19/2019 0715   HCO3 24.1 06/19/2019 0715   ACIDBASEDEF 0.8 06/19/2019 0715   O2SAT 99.2 06/19/2019 0715   CBG (last 3)  No results for input(s): GLUCAP in the last 72 hours.  Assessment/Plan: S/P Procedure(s) (LRB): XI ROBOTIC ASSISTED THORASCOPY - RIGHT LOWER LOBE LOBECTOMY (Right) Intercostal Nerve Block (Right) Lymph Node Dissection (Right) -Persistent air leak post lobectomy. For Bronch with IBV placement later this AM   LOS: 7 days    Rachel Gross 06/26/2019

## 2019-06-26 NOTE — Interval H&P Note (Signed)
History and Physical Interval Note: Persistent air leak post lobectomy  For Bronch, IBV placement, replacement of chest tube 06/26/2019 11:19 AM  Petra Kuba  has presented today for surgery, with the diagnosis of AIRLEAK.  The various methods of treatment have been discussed with the patient and family. After consideration of risks, benefits and other options for treatment, the patient has consented to  Procedure(s): VIDEO BRONCHOSCOPY WITH INSERTION OF INTERBRONCHIAL VALVE (IBV) (N/A) CHEST TUBE REPLACEMENT (Right) as a surgical intervention.  The patient's history has been reviewed, patient examined, no change in status, stable for surgery.  I have reviewed the patient's chart and labs.  Questions were answered to the patient's satisfaction.     [name]

## 2019-06-26 NOTE — Plan of Care (Signed)
  Problem: Health Behavior/Discharge Planning: Goal: Ability to manage health-related needs will improve Outcome: Progressing   Problem: Clinical Measurements: Goal: Respiratory complications will improve Outcome: Progressing   Problem: Activity: Goal: Risk for activity intolerance will decrease Outcome: Progressing   Problem: Pain Managment: Goal: General experience of comfort will improve Outcome: Progressing   Problem: Safety: Goal: Ability to remain free from injury will improve Outcome: Progressing   Problem: Education: Goal: Knowledge of the prescribed therapeutic regimen will improve Outcome: Progressing   Problem: Clinical Measurements: Goal: Postoperative complications will be avoided or minimized Outcome: Progressing   Problem: Pain Management: Goal: Pain level will decrease Outcome: Progressing   Problem: Skin Integrity: Goal: Wound healing without signs and symptoms infection will improve Outcome: Progressing

## 2019-06-27 ENCOUNTER — Inpatient Hospital Stay (HOSPITAL_COMMUNITY): Payer: Medicaid Other

## 2019-06-27 ENCOUNTER — Encounter: Payer: Self-pay | Admitting: *Deleted

## 2019-06-27 NOTE — Plan of Care (Signed)
  Problem: Health Behavior/Discharge Planning: Goal: Ability to manage health-related needs will improve Outcome: Progressing   Problem: Clinical Measurements: Goal: Will remain free from infection Outcome: Progressing Goal: Diagnostic test results will improve Outcome: Progressing Goal: Respiratory complications will improve Outcome: Progressing   Problem: Coping: Goal: Level of anxiety will decrease Outcome: Progressing   Problem: Elimination: Goal: Will not experience complications related to bowel motility Outcome: Progressing Goal: Will not experience complications related to urinary retention Outcome: Progressing   Problem: Pain Managment: Goal: General experience of comfort will improve Outcome: Progressing   Problem: Safety: Goal: Ability to remain free from injury will improve Outcome: Progressing   Problem: Skin Integrity: Goal: Risk for impaired skin integrity will decrease Outcome: Progressing   Problem: Education: Goal: Knowledge of disease or condition will improve Outcome: Progressing Goal: Knowledge of the prescribed therapeutic regimen will improve Outcome: Progressing   Problem: Activity: Goal: Risk for activity intolerance will decrease Outcome: Progressing   Problem: Cardiac: Goal: Will achieve and/or maintain hemodynamic stability Outcome: Progressing   Problem: Clinical Measurements: Goal: Postoperative complications will be avoided or minimized Outcome: Progressing

## 2019-06-27 NOTE — Plan of Care (Signed)
  Problem: Health Behavior/Discharge Planning: Goal: Ability to manage health-related needs will improve 06/27/2019 1111 by Shanon Ace, RN Outcome: Progressing 06/27/2019 0827 by Shanon Ace, RN Outcome: Progressing   Problem: Clinical Measurements: Goal: Will remain free from infection 06/27/2019 1111 by Shanon Ace, RN Outcome: Progressing 06/27/2019 0827 by Shanon Ace, RN Outcome: Progressing Goal: Diagnostic test results will improve 06/27/2019 1111 by Shanon Ace, RN Outcome: Progressing 06/27/2019 0827 by Shanon Ace, RN Outcome: Progressing Goal: Respiratory complications will improve 06/27/2019 1111 by Shanon Ace, RN Outcome: Progressing 06/27/2019 0827 by Shanon Ace, RN Outcome: Progressing   Problem: Activity: Goal: Risk for activity intolerance will decrease 06/27/2019 1111 by Shanon Ace, RN Outcome: Progressing 06/27/2019 0827 by Shanon Ace, RN Outcome: Progressing   Problem: Coping: Goal: Level of anxiety will decrease 06/27/2019 1111 by Shanon Ace, RN Outcome: Progressing 06/27/2019 0827 by Shanon Ace, RN Outcome: Progressing   Problem: Elimination: Goal: Will not experience complications related to bowel motility 06/27/2019 1111 by Shanon Ace, RN Outcome: Progressing 06/27/2019 0827 by Shanon Ace, RN Outcome: Progressing Goal: Will not experience complications related to urinary retention 06/27/2019 1111 by Shanon Ace, RN Outcome: Progressing 06/27/2019 0827 by Shanon Ace, RN Outcome: Progressing   Problem: Pain Managment: Goal: General experience of comfort will improve 06/27/2019 1111 by Shanon Ace, RN Outcome: Progressing 06/27/2019 0827 by Shanon Ace, RN Outcome: Progressing   Problem: Safety: Goal: Ability to remain free from injury will improve 06/27/2019 1111 by Shanon Ace, RN Outcome: Progressing 06/27/2019 0827 by Shanon Ace, RN Outcome: Progressing   Problem: Safety: Goal: Ability to remain  free from injury will improve 06/27/2019 1111 by Shanon Ace, RN Outcome: Progressing 06/27/2019 0827 by Shanon Ace, RN Outcome: Progressing   Problem: Skin Integrity: Goal: Risk for impaired skin integrity will decrease 06/27/2019 1111 by Shanon Ace, RN Outcome: Progressing 06/27/2019 0827 by Shanon Ace, RN Outcome: Progressing   Problem: Education: Goal: Knowledge of disease or condition will improve 06/27/2019 1111 by Shanon Ace, RN Outcome: Progressing 06/27/2019 0827 by Shanon Ace, RN Outcome: Progressing Goal: Knowledge of the prescribed therapeutic regimen will improve 06/27/2019 1111 by Shanon Ace, RN Outcome: Progressing 06/27/2019 0827 by Shanon Ace, RN Outcome: Progressing   Problem: Activity: Goal: Risk for activity intolerance will decrease 06/27/2019 1111 by Shanon Ace, RN Outcome: Progressing 06/27/2019 0827 by Shanon Ace, RN Outcome: Progressing   Problem: Cardiac: Goal: Will achieve and/or maintain hemodynamic stability 06/27/2019 1111 by Shanon Ace, RN Outcome: Progressing 06/27/2019 0827 by Shanon Ace, RN Outcome: Progressing   Problem: Clinical Measurements: Goal: Postoperative complications will be avoided or minimized 06/27/2019 1111 by Shanon Ace, RN Outcome: Progressing 06/27/2019 0827 by Shanon Ace, RN Outcome: Progressing   Problem: Respiratory: Goal: Respiratory status will improve 06/27/2019 1111 by Shanon Ace, RN Outcome: Progressing 06/27/2019 0827 by Shanon Ace, RN Outcome: Progressing   Problem: Pain Management: Goal: Pain level will decrease 06/27/2019 1111 by Shanon Ace, RN Outcome: Progressing 06/27/2019 0827 by Shanon Ace, RN Outcome: Progressing   Problem: Skin Integrity: Goal: Wound healing without signs and symptoms infection will improve 06/27/2019 1111 by Shanon Ace, RN Outcome: Progressing 06/27/2019 0827 by Shanon Ace, RN Outcome: Progressing

## 2019-06-27 NOTE — Progress Notes (Signed)
1 Day Post-Op Procedure(s) (LRB): VIDEO BRONCHOSCOPY WITH INSERTION OF TWO INTERBRONCHIAL VALVE (IBV) (N/A) Right CHEST TUBE REPLACEMENT (Right) Subjective: "my chest is sore"  Objective: Vital signs in last 24 hours: Temp:  [98.1 F (36.7 C)-99.8 F (37.7 C)] 98.1 F (36.7 C) (05/28 0429) Pulse Rate:  [83-112] 89 (05/28 0429) Cardiac Rhythm: Normal sinus rhythm (05/28 0429) Resp:  [11-20] 15 (05/28 0429) BP: (97-114)/(52-91) 108/58 (05/28 0429) SpO2:  [94 %-100 %] 96 % (05/28 0429) Weight:  [82.1 kg] 82.1 kg (05/27 1122)  Hemodynamic parameters for last 24 hours:    Intake/Output from previous day: 05/27 0701 - 05/28 0700 In: 2030.8 [P.O.:480; I.V.:1550.8] Out: 448 [Urine:300; Chest Tube:148] Intake/Output this shift: No intake/output data recorded.  General appearance: alert, cooperative and no distress Neurologic: intact Heart: regular rate and rhythm Lungs: diminished breath sounds right base + tidal movement, no air leak  Lab Results: No results for input(s): WBC, HGB, HCT, PLT in the last 72 hours. BMET: No results for input(s): NA, K, CL, CO2, GLUCOSE, BUN, CREATININE, CALCIUM in the last 72 hours.  PT/INR: No results for input(s): LABPROT, INR in the last 72 hours. ABG    Component Value Date/Time   PHART 7.349 (L) 06/19/2019 0715   HCO3 24.1 06/19/2019 0715   ACIDBASEDEF 0.8 06/19/2019 0715   O2SAT 99.2 06/19/2019 0715   CBG (last 3)  No results for input(s): GLUCAP in the last 72 hours.  Assessment/Plan: S/P Procedure(s) (LRB): VIDEO BRONCHOSCOPY WITH INSERTION OF TWO INTERBRONCHIAL VALVE (IBV) (N/A) Right CHEST TUBE REPLACEMENT (Right) -No air leak this AM, CXR shows resolution of pneumothorax, will place CT back to water seal and repeat CXR tomorrow. If no leak and CXR Ok will dc tube and send home -ambulate - only pulling 500 ml with incentive- will add flutter valve -SCD + enoxaparin for DVT prophylaxis   LOS: 8 days    [name] 06/27/2019

## 2019-06-27 NOTE — Plan of Care (Signed)
  Problem: Health Behavior/Discharge Planning: Goal: Ability to manage health-related needs will improve Outcome: Progressing   Problem: Clinical Measurements: Goal: Respiratory complications will improve Outcome: Progressing   Problem: Activity: Goal: Risk for activity intolerance will decrease Outcome: Progressing   Problem: Elimination: Goal: Will not experience complications related to bowel motility Outcome: Progressing   Problem: Elimination: Goal: Will not experience complications related to urinary retention Outcome: Progressing   Problem: Pain Managment: Goal: General experience of comfort will improve Outcome: Progressing   Problem: Safety: Goal: Ability to remain free from injury will improve Outcome: Progressing   Problem: Skin Integrity: Goal: Risk for impaired skin integrity will decrease Outcome: Progressing   Problem: Education: Goal: Knowledge of disease or condition will improve Outcome: Progressing   Problem: Education: Goal: Knowledge of the prescribed therapeutic regimen will improve Outcome: Progressing   Problem: Cardiac: Goal: Will achieve and/or maintain hemodynamic stability Outcome: Progressing   Problem: Clinical Measurements: Goal: Postoperative complications will be avoided or minimized Outcome: Progressing   Problem: Pain Management: Goal: Pain level will decrease Outcome: Progressing   Problem: Skin Integrity: Goal: Wound healing without signs and symptoms infection will improve Outcome: Progressing

## 2019-06-28 ENCOUNTER — Inpatient Hospital Stay (HOSPITAL_COMMUNITY): Payer: Medicaid Other

## 2019-06-28 MED ORDER — ENSURE ENLIVE PO LIQD
237.0000 mL | Freq: Two times a day (BID) | ORAL | Status: DC
  Administered 2019-06-28 – 2019-07-01 (×4): 237 mL via ORAL

## 2019-06-28 MED ORDER — ACETYLCYSTEINE 20 % IN SOLN
2.0000 mL | Freq: Three times a day (TID) | RESPIRATORY_TRACT | Status: DC
  Administered 2019-06-29 – 2019-06-30 (×5): 2 mL via RESPIRATORY_TRACT
  Filled 2019-06-28: qty 4
  Filled 2019-06-28: qty 2
  Filled 2019-06-28: qty 4
  Filled 2019-06-28: qty 2
  Filled 2019-06-28 (×2): qty 4
  Filled 2019-06-28: qty 2
  Filled 2019-06-28 (×5): qty 4

## 2019-06-28 NOTE — Progress Notes (Addendum)
      [facility] [address]       [place] [address]             [phone]      2 Days Post-Op Procedure(s) (LRB): VIDEO BRONCHOSCOPY WITH INSERTION OF TWO INTERBRONCHIAL VALVE (IBV) (N/A) Right CHEST TUBE REPLACEMENT (Right) Subjective: Not eating, no bowel movement, still on oxygen  Objective: Vital signs in last 24 hours: Temp:  [97.9 F (36.6 C)-98.5 F (36.9 C)] 98.1 F (36.7 C) (05/29 0816) Pulse Rate:  [90-102] 102 (05/29 0816) Cardiac Rhythm: Normal sinus rhythm (05/29 0754) Resp:  [13-19] 16 (05/29 0816) BP: (98-124)/(57-69) 99/59 (05/29 0816) SpO2:  [94 %-99 %] 95 % (05/29 0816)     Intake/Output from previous day: 05/28 0701 - 05/29 0700 In: 470 [P.O.:470] Out: 975 [Urine:775; Chest Tube:200] Intake/Output this shift: No intake/output data recorded.  General appearance: alert, cooperative and no distress Heart: regular rate and rhythm, S1, S2 normal, no murmur, click, rub or gallop Lungs: clear to auscultation bilaterally Abdomen: rare BS Extremities: extremities normal, atraumatic, no cyanosis or edema Wound: clean and dry  Lab Results: No results for input(s): WBC, HGB, HCT, PLT in the last 72 hours. BMET: No results for input(s): NA, K, CL, CO2, GLUCOSE, BUN, CREATININE, CALCIUM in the last 72 hours.  PT/INR: No results for input(s): LABPROT, INR in the last 72 hours. ABG    Component Value Date/Time   PHART 7.349 (L) 06/19/2019 0715   HCO3 24.1 06/19/2019 0715   ACIDBASEDEF 0.8 06/19/2019 0715   O2SAT 99.2 06/19/2019 0715   CBG (last 3)  No results for input(s): GLUCAP in the last 72 hours.  Assessment/Plan: S/P Procedure(s) (LRB): VIDEO BRONCHOSCOPY WITH INSERTION OF TWO INTERBRONCHIAL VALVE (IBV) (N/A) Right CHEST TUBE REPLACEMENT (Right)  1. CXR-increasing opacity on the right likely due to combination of fluid and consolidation. No pneumo. Chest tube to water seal.  2. Patient remains on 3L [place] this morning with good  saturation.  3. Chest tube: drainage was 200cc in 24 hours.  4. Continue incentive spirometer and flutter valve 5. Continue lovenox for DVT prophylaxis 6. ST, BP stable 7. Constipation/Anorexia-will order ensure for supplementation. On stool softeners. Will order something else once eating if still constipated.   Plan: Chest xray looks worse today. Intermittent air leak with + fluid wave. Encouraged to use IS. Never got her flutter valve yesterday so re-ordered. Encouraged to ambulate. Try ensure supplementation for calories. Walk in the halls as able. Keep chest tube to water seal. Weaning oxygen.     LOS: 9 days    Elgie Collard 06/28/2019  CXR shows new left consolidation No leak on CT Will add mucomyst and increase pulm toilet  Rielly Brunn O Jabron Weese

## 2019-06-29 ENCOUNTER — Inpatient Hospital Stay (HOSPITAL_COMMUNITY): Payer: Medicaid Other

## 2019-06-29 MED ORDER — POLYETHYLENE GLYCOL 3350 17 G PO PACK
17.0000 g | PACK | Freq: Every day | ORAL | Status: DC
  Administered 2019-06-29 – 2019-07-01 (×2): 17 g via ORAL
  Filled 2019-06-29: qty 1

## 2019-06-29 NOTE — Progress Notes (Addendum)
      [address]       [place] [address]             [phone]      3 Days Post-Op Procedure(s) (LRB): VIDEO BRONCHOSCOPY WITH INSERTION OF TWO INTERBRONCHIAL VALVE (IBV) (N/A) Right CHEST TUBE REPLACEMENT (Right) Subjective: Feels frustrated today.   Objective: Vital signs in last 24 hours: Temp:  [97.9 F (36.6 C)-98.2 F (36.8 C)] 97.9 F (36.6 C) (05/30 0749) Pulse Rate:  [94-112] 103 (05/30 0749) Cardiac Rhythm: Normal sinus rhythm (05/30 0800) Resp:  [16-20] 17 (05/30 0749) BP: (101-112)/(50-65) 103/65 (05/30 0749) SpO2:  [90 %-99 %] 91 % (05/30 0829)     Intake/Output from previous day: 05/29 0701 - 05/30 0700 In: 360 [P.O.:360] Out: 531 [Urine:251; Chest Tube:280] Intake/Output this shift: Total I/O In: 240 [P.O.:240] Out: -   General appearance: alert, cooperative and no distress Heart: regular rate and rhythm, S1, S2 normal, no murmur, click, rub or gallop Lungs: bilateral wheezing Abdomen: soft, non-tender; bowel sounds normal; no masses,  no organomegaly Extremities: extremities normal, atraumatic, no cyanosis or edema Wound: clean and dry  Lab Results: No results for input(s): WBC, HGB, HCT, PLT in the last 72 hours. BMET: No results for input(s): NA, K, CL, CO2, GLUCOSE, BUN, CREATININE, CALCIUM in the last 72 hours.  PT/INR: No results for input(s): LABPROT, INR in the last 72 hours. ABG    Component Value Date/Time   PHART 7.349 (L) 06/19/2019 0715   HCO3 24.1 06/19/2019 0715   ACIDBASEDEF 0.8 06/19/2019 0715   O2SAT 99.2 06/19/2019 0715   CBG (last 3)  No results for input(s): GLUCAP in the last 72 hours.  Assessment/Plan: S/P Procedure(s) (LRB): VIDEO BRONCHOSCOPY WITH INSERTION OF TWO INTERBRONCHIAL VALVE (IBV) (N/A) Right CHEST TUBE REPLACEMENT (Right)  1. CXR-increasing opacity on the right likely due to combination of fluid and consolidation similar to yesterday image. No pneumo. Chest tube to water seal.  2.  Patient remains on 3L [place] this morning with good saturation.  3. Chest tube: drainage was 280cc in 24 hours.  4. Continue incentive spirometer and flutter valve 5. Continue lovenox for DVT prophylaxis 6. ST, BP stable 7. Constipation/Anorexia-will order ensure for supplementation. On stool softeners. Miralax ordered today.   Plan: chest tube draining increasing and CXR looks about the same. Mucomyst added yesterday. Will add chest PT today and encourage pulm toilet. Needs to walk 3 x today/    LOS: 10 days    Elgie Collard 06/29/2019  Agree with above. Needs more pulmonary toilet, chest PT ordered We will keep chest tube in for now given the output.  Flor Houdeshell Bary Leriche

## 2019-06-29 NOTE — Plan of Care (Signed)
  Problem: Health Behavior/Discharge Planning: Goal: Ability to manage health-related needs will improve Outcome: Progressing   Problem: Clinical Measurements: Goal: Will remain free from infection Outcome: Progressing Goal: Diagnostic test results will improve Outcome: Progressing Goal: Respiratory complications will improve Outcome: Progressing   Problem: Activity: Goal: Risk for activity intolerance will decrease Outcome: Progressing

## 2019-06-29 NOTE — Plan of Care (Signed)
  Problem: Clinical Measurements: Goal: Respiratory complications will improve Outcome: Progressing   Problem: Clinical Measurements: Goal: Diagnostic test results will improve Outcome: Progressing   Problem: Activity: Goal: Risk for activity intolerance will decrease Outcome: Progressing   Problem: Coping: Goal: Level of anxiety will decrease Outcome: Progressing   Problem: Pain Managment: Goal: General experience of comfort will improve Outcome: Progressing   Problem: Safety: Goal: Ability to remain free from injury will improve Outcome: Progressing   Problem: Education: Goal: Knowledge of disease or condition will improve Outcome: Progressing   Problem: Education: Goal: Knowledge of the prescribed therapeutic regimen will improve Outcome: Progressing   Problem: Activity: Goal: Risk for activity intolerance will decrease Outcome: Progressing   Problem: Cardiac: Goal: Will achieve and/or maintain hemodynamic stability Outcome: Progressing   Problem: Clinical Measurements: Goal: Postoperative complications will be avoided or minimized Outcome: Progressing   Problem: Pain Management: Goal: Pain level will decrease Outcome: Progressing   Problem: Skin Integrity: Goal: Wound healing without signs and symptoms infection will improve Outcome: Progressing

## 2019-06-29 NOTE — Progress Notes (Signed)
SATURATION QUALIFICATIONS: (This note is used to comply with regulatory documentation for home oxygen)  Patient Saturations on Room Air at Rest = 85%  Patient Saturations on Room Air while Ambulating = 80%  Patient Saturations on 3 Liters of oxygen while Ambulating = 96%  Please briefly explain why patient needs home oxygen: Pt get very sob with min activity/exertion. Will need home 02.

## 2019-06-30 ENCOUNTER — Inpatient Hospital Stay (HOSPITAL_COMMUNITY): Payer: Medicaid Other

## 2019-06-30 NOTE — Progress Notes (Signed)
Patient ID: Rachel Gross, female   DOB: [birth year], 54 y.o.   MRN: [id number] [facility] DAILY ICU PROGRESS NOTE                   [address]            [place] [address]          [phone]   4 Days Post-Op Procedure(s) (LRB): VIDEO BRONCHOSCOPY WITH INSERTION OF TWO INTERBRONCHIAL VALVE (IBV) (N/A) Right CHEST TUBE REPLACEMENT (Right)  Total Length of Stay:  LOS: 11 days   Subjective: Says feels bad today , not eating   Objective: Vital signs in last 24 hours: Temp:  [97.9 F (36.6 C)-98.7 F (37.1 C)] 98.4 F (36.9 C) (05/31 0719) Pulse Rate:  [90-103] 91 (05/31 0719) Cardiac Rhythm: Normal sinus rhythm (05/31 0711) Resp:  [17-23] 23 (05/31 0719) BP: (100-115)/(49-70) 100/65 (05/31 0719) SpO2:  [93 %-99 %] 93 % (05/31 0719) FiO2 (%):  [32 %] 32 % (05/30 1320)  Filed Weights   06/19/19 0646 06/26/19 1122  Weight: 82.1 kg 82.1 kg    Weight change:    Hemodynamic parameters for last 24 hours:    Intake/Output from previous day: 05/30 0701 - 05/31 0700 In: 530 [P.O.:530] Out: 170 [Chest Tube:170]  Intake/Output this shift: Total I/O In: 180 [P.O.:180] Out: -   Current Meds: Scheduled Meds: . acetylcysteine  2 mL Nebulization TID  . bisacodyl  10 mg Oral Daily  . Chlorhexidine Gluconate Cloth  6 each Topical Daily  . divalproex  250 mg Oral QHS  . enoxaparin (LOVENOX) injection  40 mg Subcutaneous Daily  . feeding supplement (ENSURE ENLIVE)  237 mL Oral BID BM  . mometasone-formoterol  2 puff Inhalation BID  . pantoprazole  40 mg Oral QAC breakfast  . polyethylene glycol  17 g Oral Daily  . risperiDONE  2 mg Oral QHS  . senna-docusate  1 tablet Oral QHS   Continuous Infusions: . sodium chloride 10 mL/hr at 06/21/19 0410   PRN Meds:.albuterol, diphenhydrAMINE, ondansetron (ZOFRAN) IV, oxyCODONE, traMADol  General appearance: alert, cooperative and no distress Neurologic: intact Heart: regular rate and rhythm, S1, S2 normal, no  murmur, click, rub or gallop Lungs: diminished breath sounds bilaterally and wheezes bilaterally Abdomen: soft, non-tender; bowel sounds normal; no masses,  no organomegaly Extremities: extremities normal, atraumatic, no cyanosis or edema and Homans sign is negative, no sign of DVT Wound: chest tube n place, no air leak today   Lab Results: CBC:No results for input(s): WBC, HGB, HCT, PLT in the last 72 hours. BMET: No results for input(s): NA, K, CL, CO2, GLUCOSE, BUN, CREATININE, CALCIUM in the last 72 hours.  CMET: Lab Results  Component Value Date   WBC 13.1 (H) 06/21/2019   HGB 10.2 (L) 06/21/2019   HCT 30.9 (L) 06/21/2019   PLT 114 (L) 06/21/2019   GLUCOSE 150 (H) 06/21/2019   ALT 18 06/21/2019   AST 27 06/21/2019   NA 131 (L) 06/21/2019   K 4.6 06/21/2019   CL 95 (L) 06/21/2019   CREATININE 0.76 06/21/2019   BUN 14 06/21/2019   CO2 25 06/21/2019   TSH 2.902 08/06/2017   INR 1.0 06/17/2019      PT/INR: No results for input(s): LABPROT, INR in the last 72 hours. Radiology: DG Chest 1 View  Result Date: 06/30/2019 CLINICAL DATA:  Chest tube EXAM: CHEST  1 VIEW COMPARISON:  Chest radiograph from one day prior. FINDINGS: Stable right  basilar chest tube. Stable cardiomediastinal silhouette with top-normal heart size. Small right apical pneumothorax, decreased. No left pneumothorax. No pleural effusion. Improved aeration in the right lung with persistent asymmetric volume loss in the right hemithorax. Patchy right lung base opacity, improved. Clear left lung. Inter bronchial valves overlie the right hilum. Stable subcutaneous emphysema in the lateral right chest wall. IMPRESSION: 1. Small right apical pneumothorax, decreased. Stable right basilar chest tube. 2. Improved aeration in the right lung with volume loss in the right hemithorax. Improved patchy right lung base opacity. Electronically Signed   By: Ilona Sorrel M.D.   On: 06/30/2019 07:33   DG Chest 1 View  Result Date:  06/29/2019 CLINICAL DATA:  Shortness of breath. EXAM: CHEST  1 VIEW COMPARISON:  Jun 28, 2019 FINDINGS: A right-sided chest tube remains in place. Subcutaneous air seen in the right chest wall. A right apical pneumothorax is suspected and stable. The left lung is clear. No left-sided pneumothorax. Shift of the mediastinum to the right is stable. No interval changes. Stable right effusion with underlying opacity. IMPRESSION: 1. Stable right chest tube. Suspected right apical pneumothorax, unchanged. Subcutaneous air in the right chest wall. No interval changes. 2. Persistent right-sided pleural effusion with underlying opacity. Electronically Signed   By: Dorise Bullion III M.D   On: 06/29/2019 11:35     Assessment/Plan: S/P Procedure(s) (LRB): VIDEO BRONCHOSCOPY WITH INSERTION OF TWO INTERBRONCHIAL VALVE (IBV) (N/A) Right CHEST TUBE REPLACEMENT (Right) Mobilize Increase po intake  Ct tube output decreasing , likely remove in am Improved aeration in the right lung with volume loss in the right hemithorax  [name] 06/30/2019 9:12 AM

## 2019-06-30 NOTE — Plan of Care (Signed)
  Problem: Health Behavior/Discharge Planning: Goal: Ability to manage health-related needs will improve Outcome: Progressing   Problem: Clinical Measurements: Goal: Will remain free from infection Outcome: Progressing Goal: Diagnostic test results will improve Outcome: Progressing Goal: Respiratory complications will improve Outcome: Progressing   Problem: Activity: Goal: Risk for activity intolerance will decrease Outcome: Progressing   Problem: Coping: Goal: Level of anxiety will decrease Outcome: Progressing

## 2019-07-01 ENCOUNTER — Inpatient Hospital Stay (HOSPITAL_COMMUNITY): Payer: Medicaid Other

## 2019-07-01 MED ORDER — GUAIFENESIN ER 600 MG PO TB12
600.0000 mg | ORAL_TABLET | Freq: Two times a day (BID) | ORAL | Status: DC
  Administered 2019-07-01 (×2): 600 mg via ORAL
  Filled 2019-07-01 (×3): qty 1

## 2019-07-01 NOTE — Plan of Care (Signed)
  Problem: Health Behavior/Discharge Planning: Goal: Ability to manage health-related needs will improve Outcome: Progressing   Problem: Clinical Measurements: Goal: Will remain free from infection Outcome: Progressing Goal: Diagnostic test results will improve Outcome: Progressing Goal: Respiratory complications will improve Outcome: Progressing   Problem: Activity: Goal: Risk for activity intolerance will decrease Outcome: Progressing   Problem: Coping: Goal: Level of anxiety will decrease Outcome: Progressing   Problem: Elimination: Goal: Will not experience complications related to bowel motility Outcome: Progressing Goal: Will not experience complications related to urinary retention Outcome: Progressing   Problem: Pain Managment: Goal: General experience of comfort will improve Outcome: Progressing   Problem: Safety: Goal: Ability to remain free from injury will improve Outcome: Progressing   Problem: Skin Integrity: Goal: Risk for impaired skin integrity will decrease Outcome: Progressing   Problem: Education: Goal: Knowledge of disease or condition will improve Outcome: Progressing Goal: Knowledge of the prescribed therapeutic regimen will improve Outcome: Progressing   Problem: Activity: Goal: Risk for activity intolerance will decrease Outcome: Progressing   Problem: Cardiac: Goal: Will achieve and/or maintain hemodynamic stability Outcome: Progressing   Problem: Clinical Measurements: Goal: Postoperative complications will be avoided or minimized Outcome: Progressing   Problem: Respiratory: Goal: Respiratory status will improve Outcome: Progressing   Problem: Pain Management: Goal: Pain level will decrease Outcome: Progressing   Problem: Skin Integrity: Goal: Wound healing without signs and symptoms infection will improve Outcome: Progressing

## 2019-07-01 NOTE — Progress Notes (Signed)
5 Days Post-Op Procedure(s) (LRB): VIDEO BRONCHOSCOPY WITH INSERTION OF TWO INTERBRONCHIAL VALVE (IBV) (N/A) Right CHEST TUBE REPLACEMENT (Right) Subjective: Some congestion this AM, frustrated   Objective: Vital signs in last 24 hours: Temp:  [97.8 F (36.6 C)-98.9 F (37.2 C)] 98.5 F (36.9 C) (06/01 0722) Pulse Rate:  [87-97] 96 (06/01 0722) Cardiac Rhythm: Normal sinus rhythm (06/01 0720) Resp:  [13-24] 16 (06/01 0722) BP: (96-110)/(59-68) 100/60 (06/01 0722) SpO2:  [92 %-100 %] 92 % (06/01 0722)  Hemodynamic parameters for last 24 hours:    Intake/Output from previous day: 05/31 0701 - 06/01 0700 In: 420 [P.O.:420] Out: 400 [Urine:100; Chest Tube:300] Intake/Output this shift: No intake/output data recorded.  General appearance: alert, cooperative and no distress Neurologic: intact Heart: regular rate and rhythm Lungs: diminished breath sounds right base no air leak  Lab Results: No results for input(s): WBC, HGB, HCT, PLT in the last 72 hours. BMET: No results for input(s): NA, K, CL, CO2, GLUCOSE, BUN, CREATININE, CALCIUM in the last 72 hours.  PT/INR: No results for input(s): LABPROT, INR in the last 72 hours. ABG    Component Value Date/Time   PHART 7.349 (L) 06/19/2019 0715   HCO3 24.1 06/19/2019 0715   ACIDBASEDEF 0.8 06/19/2019 0715   O2SAT 99.2 06/19/2019 0715   CBG (last 3)  No results for input(s): GLUCAP in the last 72 hours.  Assessment/Plan: S/P Procedure(s) (LRB): VIDEO BRONCHOSCOPY WITH INSERTION OF TWO INTERBRONCHIAL VALVE (IBV) (N/A) Right CHEST TUBE REPLACEMENT (Right) - No air leak- will dc chest tube Still has some middle lobe atelectasis on CXR, will continue flutter, dc mucomyst, start mucinex Ambulate Wean O2 Possibly home tomorrow if on less O2 and CXR OK   LOS: 12 days    [name] 07/01/2019

## 2019-07-02 ENCOUNTER — Inpatient Hospital Stay (HOSPITAL_COMMUNITY): Payer: Medicaid Other

## 2019-07-02 DIAGNOSIS — C3491 Malignant neoplasm of unspecified part of right bronchus or lung: Secondary | ICD-10-CM

## 2019-07-02 LAB — TYPE AND SCREEN
ABO/RH(D): AB POS
Antibody Screen: NEGATIVE
Unit division: 0
Unit division: 0

## 2019-07-02 LAB — BPAM RBC
Blood Product Expiration Date: 202106072359
Blood Product Expiration Date: 202106192359
ISSUE DATE / TIME: 202105261037
Unit Type and Rh: 6200
Unit Type and Rh: 8400

## 2019-07-02 MED ORDER — TRAMADOL HCL 50 MG PO TABS: 50.0000 mg | ORAL_TABLET | Freq: Four times a day (QID) | ORAL | 0 refills | Status: AC | PRN

## 2019-07-02 MED ORDER — GUAIFENESIN ER 600 MG PO TB12: 600.0000 mg | ORAL_TABLET | Freq: Two times a day (BID) | ORAL | Status: DC

## 2019-07-02 NOTE — TOC Transition Note (Signed)
Transition of Care [facility]) - CM/SW Discharge Note   Patient Details  Name: Rachel Gross MRN: [id number] Date of Birth: Dec 17, 1965  Transition of Care [facility]) CM/SW Contact:  Zenon Mayo, RN Phone Number: 07/02/2019, 12:26 PM   Clinical Narrative:    Patient for dc today, she needs home oxygen, NCM made referral to [facility] with [employer] for home oxygen.   Final next level of care: Home/Self Care Barriers to Discharge: No Barriers Identified   Patient Goals and CMS Choice        Discharge Placement                       Discharge Plan and Services                DME Arranged: Oxygen DME Agency: [employer] Date DME Agency Contacted: 07/02/19 Time DME Agency Contacted: [id number] Representative spoke with at DME Agency: Thedore Mins HH Arranged: NA          Social Determinants of Health ([place]) Interventions     Readmission Risk Interventions No flowsheet data found.

## 2019-07-02 NOTE — Progress Notes (Signed)
SATURATION QUALIFICATIONS: (This note is used to comply with regulatory documentation for home oxygen)  Patient Saturations on Room Air at Rest = 84 %  Patient Saturations on Room Air while Ambulating = 79 %  Patient Saturations on 4 Liters of oxygen while Ambulating =  92 %  Please briefly explain why patient needs home oxygen: Patient desaturated with RA and O2 3L with  SpO2 90-93%. HS [employer]

## 2019-07-02 NOTE — Care Management Important Message (Signed)
Important Message  Patient Details  Name: Rachel Gross MRN: [id number] Date of Birth: 1965-12-07   Medicare Important Message Given:  Yes  Patient left Prior to IM delivery . IM mailed to the patient address   Orbie Pyo 07/02/2019, 2:31 PM

## 2019-07-02 NOTE — TOC Progression Note (Signed)
Transition of Care [facility]) - Progression Note    Patient Details  Name: Rachel Gross MRN: [id number] Date of Birth: Nov 24, 1965  Transition of Care [facility]) CM/SW Contact  Zenon Mayo, RN Phone Number: 07/02/2019, 10:28 AM  Clinical Narrative:    Patient will need home oxygen, NCM made referral to [facility] with [employer].        Expected Discharge Plan and Services           Expected Discharge Date: 07/02/19                                     Social Determinants of Health (SDOH) Interventions    Readmission Risk Interventions No flowsheet data found.

## 2019-07-02 NOTE — Plan of Care (Signed)
Discharge instructions given to patient. Questions answered. Educated on new medication regimen, signs/symptoms, restrictions, and follow up appointments. Prescriptions [facility] pharmacy. PIV DC, hemostasis achieved. Vital signs stable. All belongings sent home with patient.   Pt escorted by NT via wheelchair to private vehicle driven by family.  Delayed DC d/t DME- awaiting oxygen to be delivered to pt bedside for home use.  Problem: Health Behavior/Discharge Planning: Goal: Ability to manage health-related needs will improve Outcome: Adequate for Discharge   Problem: Clinical Measurements: Goal: Will remain free from infection Outcome: Adequate for Discharge Goal: Diagnostic test results will improve Outcome: Adequate for Discharge Goal: Respiratory complications will improve Outcome: Adequate for Discharge   Problem: Activity: Goal: Risk for activity intolerance will decrease Outcome: Adequate for Discharge   Problem: Coping: Goal: Level of anxiety will decrease Outcome: Adequate for Discharge   Problem: Elimination: Goal: Will not experience complications related to bowel motility Outcome: Adequate for Discharge Goal: Will not experience complications related to urinary retention Outcome: Adequate for Discharge   Problem: Pain Managment: Goal: General experience of comfort will improve Outcome: Adequate for Discharge   Problem: Safety: Goal: Ability to remain free from injury will improve Outcome: Adequate for Discharge   Problem: Skin Integrity: Goal: Risk for impaired skin integrity will decrease Outcome: Adequate for Discharge   Problem: Education: Goal: Knowledge of disease or condition will improve Outcome: Adequate for Discharge Goal: Knowledge of the prescribed therapeutic regimen will improve Outcome: Adequate for Discharge   Problem: Activity: Goal: Risk for activity intolerance will decrease Outcome: Adequate for Discharge   Problem: Cardiac: Goal:  Will achieve and/or maintain hemodynamic stability Outcome: Adequate for Discharge   Problem: Clinical Measurements: Goal: Postoperative complications will be avoided or minimized Outcome: Adequate for Discharge   Problem: Respiratory: Goal: Respiratory status will improve Outcome: Adequate for Discharge   Problem: Pain Management: Goal: Pain level will decrease Outcome: Adequate for Discharge   Problem: Skin Integrity: Goal: Wound healing without signs and symptoms infection will improve Outcome: Adequate for Discharge

## 2019-07-02 NOTE — Progress Notes (Addendum)
[place]       [place] [address]             [phone]      6 Days Post-Op Procedure(s) (LRB): VIDEO BRONCHOSCOPY WITH INSERTION OF TWO INTERBRONCHIAL VALVE (IBV) (N/A) Right CHEST TUBE REPLACEMENT (Right) Subjective: Wants to go home, feels comfortable with O2 at 3 liters  Objective: Vital signs in last 24 hours: Temp:  [97.5 F (36.4 C)-98.6 F (37 C)] 97.5 F (36.4 C) (06/02 0359) Pulse Rate:  [81-93] 81 (06/02 0359) Cardiac Rhythm: Normal sinus rhythm (06/02 0737) Resp:  [12-20] 13 (06/02 0359) BP: (99-115)/(56-66) 105/60 (06/02 0359) SpO2:  [93 %-97 %] 97 % (06/02 0359)  Hemodynamic parameters for last 24 hours:    Intake/Output from previous day: 06/01 0701 - 06/02 0700 In: 240 [P.O.:240] Out: 150 [Urine:100; Chest Tube:50] Intake/Output this shift: No intake/output data recorded.  General appearance: alert, cooperative and no distress Heart: regular rate and rhythm Lungs: coarse BS with scattered ronchi Abdomen: soft, non-tender Extremities: no calf tenderness/edema Wound: incis healing well  Lab Results: No results for input(s): WBC, HGB, HCT, PLT in the last 72 hours. BMET: No results for input(s): NA, K, CL, CO2, GLUCOSE, BUN, CREATININE, CALCIUM in the last 72 hours.  PT/INR: No results for input(s): LABPROT, INR in the last 72 hours. ABG    Component Value Date/Time   PHART 7.349 (L) 06/19/2019 0715   HCO3 24.1 06/19/2019 0715   ACIDBASEDEF 0.8 06/19/2019 0715   O2SAT 99.2 06/19/2019 0715   CBG (last 3)  No results for input(s): GLUCAP in the last 72 hours.  Meds Scheduled Meds: . bisacodyl  10 mg Oral Daily  . Chlorhexidine Gluconate Cloth  6 each Topical Daily  . divalproex  250 mg Oral QHS  . enoxaparin (LOVENOX) injection  40 mg Subcutaneous Daily  . feeding supplement (ENSURE ENLIVE)  237 mL Oral BID BM  . guaiFENesin  600 mg Oral BID  . mometasone-formoterol  2 puff Inhalation BID  . pantoprazole  40 mg  Oral QAC breakfast  . polyethylene glycol  17 g Oral Daily  . risperiDONE  2 mg Oral QHS  . senna-docusate  1 tablet Oral QHS   Continuous Infusions: . sodium chloride 10 mL/hr at 06/21/19 0410   PRN Meds:.albuterol, diphenhydrAMINE, ondansetron (ZOFRAN) IV, oxyCODONE, traMADol  Xrays DG Chest Port 1 View  Result Date: 07/01/2019 CLINICAL DATA:  Right-sided chest tube removed. Status post partial lobectomy. EXAM: PORTABLE CHEST 1 VIEW COMPARISON:  One-view chest x-ray 07/01/2019 at 6:13 a.m. FINDINGS: The heart size is normal. Right apical pneumothorax is stable following removal of right-sided chest tube. Subcutaneous emphysema is again noted, slightly improved. Right lower lobe airspace opacities have improved. IMPRESSION: 1. Stable right apical pneumothorax following removal of chest tube. 2. Improving subcutaneous emphysema. 3. Improving right lower lobe airspace disease. Electronically Signed   By: San Morelle M.D.   On: 07/01/2019 10:31   DG Chest Port 1 View  Result Date: 07/01/2019 CLINICAL DATA:  Chest tube. EXAM: PORTABLE CHEST 1 VIEW COMPARISON:  Chest x-ray 06/30/2019. FINDINGS: Right chest tube in stable position. Small right apical pneumothorax again noted. Right chest wall subcutaneous emphysema again noted. Persistent right base atelectasis/infiltrate and small right pleural effusion. Intrabronchial valves again noted. Heart size normal. IMPRESSION: 1. Right chest tube in stable position. Small right apical pneumothorax is again noted. Right chest wall subcutaneous emphysema again noted. These findings are unchanged. 2. Persistent  right base atelectasis/infiltrate and small right pleural effusion. Electronically Signed   By: Marcello Moores  Register   On: 07/01/2019 06:21    Assessment/Plan: S/P Procedure(s) (LRB): VIDEO BRONCHOSCOPY WITH INSERTION OF TWO INTERBRONCHIAL VALVE (IBV) (N/A) Right CHEST TUBE REPLACEMENT (Right)  1 afeb , VSS some sinus tach, mostly SR 3 sats good  on 3 liters 3 CXR appears pretty stable c/w yesterdays film with tube out 4 no new labs 6 poss home today with home O2 if MD agrees   LOS: 13 days    John Giovanni PA-C [phone] 07/02/2019 Feels well. No shortness of breath since tube removed RML atelectasis continues to improve. Apical space unchanged Will dc home with home O2, should only need for a brief time  Remo Lipps C. Roxan Hockey, MD [facility] [phone]

## 2019-07-03 ENCOUNTER — Other Ambulatory Visit: Payer: Self-pay | Admitting: *Deleted

## 2019-07-03 NOTE — Progress Notes (Signed)
The proposed treatment discussed in cancer conference 07/03/19 is for discussion purpose only and is not a binding recommendation.  The patient was not physically examined nor present for their treatment options.  Therefore, final treatment plans cannot be decided.

## 2019-07-08 ENCOUNTER — Ambulatory Visit: Payer: Medicaid Other | Admitting: Thoracic Surgery (Cardiothoracic Vascular Surgery)

## 2019-07-11 ENCOUNTER — Other Ambulatory Visit: Payer: Self-pay

## 2019-07-11 ENCOUNTER — Encounter (INDEPENDENT_AMBULATORY_CARE_PROVIDER_SITE_OTHER): Payer: Self-pay

## 2019-07-11 DIAGNOSIS — Z4802 Encounter for removal of sutures: Secondary | ICD-10-CM

## 2019-07-14 ENCOUNTER — Other Ambulatory Visit: Payer: Self-pay | Admitting: Thoracic Surgery (Cardiothoracic Vascular Surgery)

## 2019-07-14 DIAGNOSIS — C3491 Malignant neoplasm of unspecified part of right bronchus or lung: Secondary | ICD-10-CM

## 2019-07-15 ENCOUNTER — Other Ambulatory Visit: Payer: Self-pay

## 2019-07-15 ENCOUNTER — Encounter: Payer: Self-pay | Admitting: *Deleted

## 2019-07-15 ENCOUNTER — Other Ambulatory Visit: Payer: Self-pay | Admitting: *Deleted

## 2019-07-15 ENCOUNTER — Ambulatory Visit (INDEPENDENT_AMBULATORY_CARE_PROVIDER_SITE_OTHER): Payer: Self-pay | Admitting: Thoracic Surgery (Cardiothoracic Vascular Surgery)

## 2019-07-15 ENCOUNTER — Ambulatory Visit
Admission: RE | Admit: 2019-07-15 | Discharge: 2019-07-15 | Disposition: A | Discharge status: Home / Self Care | Payer: Medicaid Other | Source: Ambulatory Visit | Attending: Thoracic Surgery (Cardiothoracic Vascular Surgery) | Admitting: Thoracic Surgery (Cardiothoracic Vascular Surgery)

## 2019-07-15 ENCOUNTER — Encounter: Payer: Self-pay | Admitting: Thoracic Surgery (Cardiothoracic Vascular Surgery)

## 2019-07-15 VITALS — BP 115/76 | HR 106 | Temp 98.4°F | Resp 18 | Ht 61.0 in | Wt 163.0 lb

## 2019-07-15 DIAGNOSIS — C3491 Malignant neoplasm of unspecified part of right bronchus or lung: Secondary | ICD-10-CM

## 2019-07-15 DIAGNOSIS — J9382 Other air leak: Secondary | ICD-10-CM

## 2019-07-15 DIAGNOSIS — Z902 Acquired absence of lung [part of]: Secondary | ICD-10-CM

## 2019-07-15 NOTE — Progress Notes (Signed)
[address]       [place] [address]             [phone]     HPI: [name] returns for a scheduled follow-up visit  Rachel Gross is a 54 year old with a history of tobacco abuse, COPD, ethanol abuse, hepatitis C, cirrhosis, portal hypertensive gastropathy, and thrombocytopenia.  She was found to have a right lower lobe lung nodule on a CT of the abdomen done in February 2020.  Over time this nodule increased in size.  Recently a CT of the chest showed it was 1.4 x 1.3 cm.  On PET CT it was hypermetabolic with an SUV of 4.  There is no radiographic evidence of regional or distant metastases.  I did a robotic right lower lobectomy and node dissection on 06/19/2019.  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  Her postoperative course was complicated by an air leak.  Intrabronchial valves were placed.  She finally went home on day 12.  She is doing well.  She does get short of breath with exertion.  She is no longer on oxygen.  She has some discomfort but is no longer taking any narcotics.  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      diphenhydrAMINE (BENADRYL) 25 MG tablet Take 25 mg by mouth at bedtime.      divalproex (DEPAKOTE) 250 MG DR tablet  Take 250 mg by mouth at bedtime.      pantoprazole (PROTONIX) 40 MG tablet Take one tablet before breakfast daily. (Patient taking differently: Take 40 mg by mouth daily before breakfast. ) 90 tablet 3   risperiDONE (RISPERDAL) 2 MG tablet Take 2 mg by mouth at bedtime.      SYMBICORT 160-4.5 MCG/ACT inhaler Inhale 2 puffs into the lungs 2 (two) times daily.     No current facility-administered medications for this visit.    Physical Exam BP 115/76 (BP Location: Left Arm, Patient Position: Sitting, Cuff Size: Normal)    Pulse (!) 106    Temp 98.4 F (36.9 C)    Resp 18    Ht 5\' 1"  (1.549 m)    Wt 163 lb (73.9 kg)    SpO2 91% Comment: RA   BMI 30.45 kg/m  54 year old woman in no acute distress Alert and oriented x3 with no focal deficits Lungs with diminished breath sounds right base, faint wheezing on the left Cardiac regular rate and rhythm Incisions healing well  Diagnostic Tests: CHEST - 2 VIEW  COMPARISON:  Chest x-ray 07/02/2019.  FINDINGS: Resolving subcutaneous emphysema in the right chest wall. Previously noted right apical  pneumothorax is no longer confidently identified. Small right pleural effusion persists. Bronchial occluded devices project over the right hilar region. Patchy interstitial and airspace disease again noted throughout the lungs bilaterally (right greater than left) with significantly improved aeration overall when compared to the prior examination. The most significant areas of residual disease are in the right mid to lower lung. No left pleural effusion. No evidence of pulmonary edema. Heart size is normal. Upper mediastinal contours are within normal limits.  IMPRESSION: 1. Resolving multilobar bilateral pneumonia with residual disease throughout the right mid to lower lung. 2. Previously noted right-sided pneumothorax has resolved. 3. Persistent small right pleural effusion.   Electronically Signed   By: Vinnie Langton M.D.   On:  07/15/2019 14:42 I personally reviewed the CT images and concur with the findings noted above  Impression: Rachel Gross is a 54 year old woman ith a history of tobacco abuse, COPD, ethanol abuse, hepatitis C, cirrhosis, portal hypertensive gastropathy, and thrombocytopenia.  She had a robotic right lower lobectomy for a clinical stage Ia lesion that turned out to be a pathologic stage IIIa (T1N2) adenocarcinoma.  She had air leak postoperatively.  IBV were placed and her air leak resolved.  She had her chest tube removed prior to discharge.  She did go home on oxygen.  She now has stopped using that.  Her saturation was 91% on room air today.  She does have some decreased exercise tolerance.  That is likely to always be the case but it should improve with time and regular exercise.  Importance of tobacco cessation emphasized.  She has an appointment to see Dr. Delton Coombes later this week.  I emphasized the importance of adjuvant therapy in her case that she had occult nodal disease.  She will need her IBV removed.  I recommended we do bronchoscopy for IBV removal after 4 July.  I discussed the procedure with her including the indications, risk, benefits, and alternatives.  She understands the risks include those associated with general anesthesia.  She understands the risks of bleeding and pneumothorax.  Plan: She has an appointment to see Dr. Delton Coombes on Thursday Plan for bronchoscopy for IBV removal on Friday, 08/08/2019  [name], MD [facility] [phone]

## 2019-07-15 NOTE — H&P (View-Only) (Signed)
[address]       [place] [address]             [phone]     HPI: [name] returns for a scheduled follow-up visit  Rachel Gross is a 54 year old with a history of tobacco abuse, COPD, ethanol abuse, hepatitis C, cirrhosis, portal hypertensive gastropathy, and thrombocytopenia.  She was found to have a right lower lobe lung nodule on a CT of the abdomen done in February 2020.  Over time this nodule increased in size.  Recently a CT of the chest showed it was 1.4 x 1.3 cm.  On PET CT it was hypermetabolic with an SUV of 4.  There is no radiographic evidence of regional or distant metastases.  I did a robotic right lower lobectomy and node dissection on 06/19/2019.  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  Her postoperative course was complicated by an air leak.  Intrabronchial valves were placed.  She finally went home on day 12.  She is doing well.  She does get short of breath with exertion.  She is no longer on oxygen.  She has some discomfort but is no longer taking any narcotics.  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      diphenhydrAMINE (BENADRYL) 25 MG tablet Take 25 mg by mouth at bedtime.      divalproex (DEPAKOTE) 250 MG DR tablet  Take 250 mg by mouth at bedtime.      pantoprazole (PROTONIX) 40 MG tablet Take one tablet before breakfast daily. (Patient taking differently: Take 40 mg by mouth daily before breakfast. ) 90 tablet 3   risperiDONE (RISPERDAL) 2 MG tablet Take 2 mg by mouth at bedtime.      SYMBICORT 160-4.5 MCG/ACT inhaler Inhale 2 puffs into the lungs 2 (two) times daily.     No current facility-administered medications for this visit.    Physical Exam BP 115/76 (BP Location: Left Arm, Patient Position: Sitting, Cuff Size: Normal)    Pulse (!) 106    Temp 98.4 F (36.9 C)    Resp 18    Ht 5\' 1"  (1.549 m)    Wt 163 lb (73.9 kg)    SpO2 91% Comment: RA   BMI 30.51 kg/m  54 year old woman in no acute distress Alert and oriented x3 with no focal deficits Lungs with diminished breath sounds right base, faint wheezing on the left Cardiac regular rate and rhythm Incisions healing well  Diagnostic Tests: CHEST - 2 VIEW  COMPARISON:  Chest x-ray 07/02/2019.  FINDINGS: Resolving subcutaneous emphysema in the right chest wall. Previously noted right apical  pneumothorax is no longer confidently identified. Small right pleural effusion persists. Bronchial occluded devices project over the right hilar region. Patchy interstitial and airspace disease again noted throughout the lungs bilaterally (right greater than left) with significantly improved aeration overall when compared to the prior examination. The most significant areas of residual disease are in the right mid to lower lung. No left pleural effusion. No evidence of pulmonary edema. Heart size is normal. Upper mediastinal contours are within normal limits.  IMPRESSION: 1. Resolving multilobar bilateral pneumonia with residual disease throughout the right mid to lower lung. 2. Previously noted right-sided pneumothorax has resolved. 3. Persistent small right pleural effusion.   Electronically Signed   By: Vinnie Langton M.D.   On:  07/15/2019 14:42 I personally reviewed the CT images and concur with the findings noted above  Impression: Rachel Gross is a 54 year old woman ith a history of tobacco abuse, COPD, ethanol abuse, hepatitis C, cirrhosis, portal hypertensive gastropathy, and thrombocytopenia.  She had a robotic right lower lobectomy for a clinical stage Ia lesion that turned out to be a pathologic stage IIIa (T1N2) adenocarcinoma.  She had air leak postoperatively.  IBV were placed and her air leak resolved.  She had her chest tube removed prior to discharge.  She did go home on oxygen.  She now has stopped using that.  Her saturation was 91% on room air today.  She does have some decreased exercise tolerance.  That is likely to always be the case but it should improve with time and regular exercise.  Importance of tobacco cessation emphasized.  She has an appointment to see Dr. Delton Coombes later this week.  I emphasized the importance of adjuvant therapy in her case that she had occult nodal disease.  She will need her IBV removed.  I recommended we do bronchoscopy for IBV removal after 4 July.  I discussed the procedure with her including the indications, risk, benefits, and alternatives.  She understands the risks include those associated with general anesthesia.  She understands the risks of bleeding and pneumothorax.  Plan: She has an appointment to see Dr. Delton Coombes on Thursday Plan for bronchoscopy for IBV removal on Friday, 08/08/2019  [name], MD [facility] [phone]

## 2019-07-17 ENCOUNTER — Encounter (HOSPITAL_COMMUNITY): Payer: Self-pay | Admitting: Hematology

## 2019-07-17 ENCOUNTER — Encounter (HOSPITAL_COMMUNITY): Payer: Self-pay | Admitting: *Deleted

## 2019-07-17 ENCOUNTER — Other Ambulatory Visit: Payer: Self-pay

## 2019-07-17 ENCOUNTER — Inpatient Hospital Stay (HOSPITAL_COMMUNITY): Payer: Medicaid Other | Attending: Hematology | Admitting: Hematology

## 2019-07-17 VITALS — BP 109/86 | HR 92 | Temp 98.4°F | Resp 18 | Wt 167.5 lb

## 2019-07-17 DIAGNOSIS — D693 Immune thrombocytopenic purpura: Secondary | ICD-10-CM | POA: Insufficient documentation

## 2019-07-17 DIAGNOSIS — K7460 Unspecified cirrhosis of liver: Secondary | ICD-10-CM | POA: Insufficient documentation

## 2019-07-17 DIAGNOSIS — C3431 Malignant neoplasm of lower lobe, right bronchus or lung: Secondary | ICD-10-CM | POA: Insufficient documentation

## 2019-07-17 NOTE — Progress Notes (Signed)
START ON PATHWAY REGIMEN - Non-Small Cell Lung     A cycle is every 21 days:     Pemetrexed      Carboplatin   **Always confirm dose/schedule in your pharmacy ordering system**  Patient Characteristics: Postoperative without Neoadjuvant Therapy (Pathologic Staging), Stage III, Adjuvant Chemotherapy, Nonsquamous Cell Therapeutic Status: Postoperative without Neoadjuvant Therapy (Pathologic Staging) AJCC T Category: pT1c AJCC N Category: pN2 AJCC M Category: cM0 AJCC 8 Stage Grouping: IIIA Histology: Nonsquamous Cell Intent of Therapy: Curative Intent, Discussed with Patient

## 2019-07-17 NOTE — Progress Notes (Signed)
Rachel Gross, [place] [id number]   CLINIC:  Medical Oncology/Hematology  PCP:  Celene Squibb, MD [address] [name] [address] [phone]   REASON FOR VISIT:  Follow-up for lung nodule  PRIOR THERAPY: Right lower lobectomy on 06/19/2019.  NGS Results: Test will be sent.  CURRENT THERAPY: Observation  BRIEF ONCOLOGIC HISTORY:  Oncology History   No history exists.    CANCER STAGING: Cancer Staging No matching staging information was found for the patient.  INTERVAL HISTORY:  [name], a 54 y.o. female, returns for routine follow-up of her lung nodule. Rachel Gross was last seen on 05/15/2019.   She had right lower lobectomy with Dr. Roxan Hockey on 06/19/2019. She has moderately-differentiated, metastatic adenocarcinoma of the lung. She will get the intrabronchial valve out on 08/08/2019. She reports difficulty breathing.  Her appetite is good, but she has no energy. She has quit smoking.   REVIEW OF SYSTEMS:  Review of Systems  Constitutional: Positive for fatigue (depleted energy). Negative for appetite change.  Cardiovascular: Positive for chest pain (7/10 rib cage pain).  All other systems reviewed and are negative.   PAST MEDICAL/SURGICAL HISTORY:  Past Medical History:  Diagnosis Date  . Alcoholic hepatitis without ascites   . Anxiety   . Arthritis    knees, hands  . 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 Bipolar disorder ([place])   . Cancer ([place])    right lung  . COPD (chronic obstructive pulmonary disease) ([place])   . Depression   . Emphysema lung ([place])   . GERD (gastroesophageal reflux disease)   . Hepatitis C    s/p treatment with Epclusa  . History of HPV infection   . History of pneumonia   . Pneumonia   . Smoker    Past Surgical History:  Procedure Laterality Date  . BIOPSY  05/02/2018   Procedure: BIOPSY;  Surgeon: Daneil Dolin, MD;  [facility];   Service: Endoscopy;;  gastric  . CESAREAN SECTION    . CHEST TUBE INSERTION Right 06/26/2019   Procedure: Right CHEST TUBE REPLACEMENT;  Surgeon: [name], MD;  Location: [facility];  Service: Thoracic;  Laterality: Right;  . COLONOSCOPY WITH PROPOFOL N/A 03/03/2019   Procedure: COLONOSCOPY WITH PROPOFOL;  Surgeon: Daneil Dolin, MD;  [facility];  Service: Endoscopy;  Laterality: N/A;  9:15am  . ESOPHAGOGASTRODUODENOSCOPY (EGD) WITH PROPOFOL N/A 05/02/2018   Procedure: ESOPHAGOGASTRODUODENOSCOPY (EGD) WITH PROPOFOL;  Surgeon: Daneil Dolin, MD;  [facility];  Service: Endoscopy;  Laterality: N/A;  8:30am  . EYE SURGERY Bilateral    cataract  . HEMOSTASIS CLIP PLACEMENT  03/03/2019   Procedure: HEMOSTASIS CLIP PLACEMENT;  Surgeon: Daneil Dolin, MD;  [facility];  Service: Endoscopy;;  . INTERCOSTAL NERVE BLOCK Right 06/19/2019   Procedure: Intercostal Nerve Block;  Surgeon: [name], MD;  Location: [facility];  Service: Thoracic;  Laterality: Right;  . IR US GUIDE VASC ACCESS RIGHT  03/13/2018  . LYMPH NODE DISSECTION Right 06/19/2019   Procedure: Lymph Node Dissection;  Surgeon: [name], MD;  Location: [facility];  Service: Thoracic;  Laterality: Right;  . POLYPECTOMY  03/03/2019   Procedure: POLYPECTOMY;  Surgeon: Daneil Dolin, MD;  [facility];  Service: Endoscopy;;  . TONSILLECTOMY    . TOOTH EXTRACTION  01/31/2018   x 7  . VIDEO BRONCHOSCOPY  WITH INSERTION OF INTERBRONCHIAL VALVE (IBV) N/A 06/26/2019   Procedure: VIDEO BRONCHOSCOPY WITH INSERTION OF TWO INTERBRONCHIAL VALVE (IBV);  Surgeon: [name], MD;  Location: [facility];  Service: Thoracic;  Laterality: N/A;    SOCIAL HISTORY:  Social History   Socioeconomic History  . Marital status: Single    Spouse name: Not on file  . Number of children: Not on file  . Years of education: Not on file  . Highest education level: Not on file  Occupational  History  . Not on file  Tobacco Use  . Smoking status: Former Smoker    Packs/day: 0.50    Years: 38.00    Pack years: 19.00    Types: Cigarettes    Quit date: 06/19/2019    Years since quitting: 0.0  . Smokeless tobacco: Never Used  Vaping Use  . Vaping Use: Never used  Substance and Sexual Activity  . Alcohol use: Not Currently    Comment: Last use of alcohol 03/2016  . 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 Sexual activity: Not on file  Other Topics Concern  . Not on file  Social History Narrative  . Not on file   Social Determinants of Health   Financial Resource Strain:   . Difficulty of Paying Living Expenses:   Food Insecurity:   . Worried About Charity fundraiser in the Last Year:   . Arboriculturist in the Last Year:   Transportation Needs:   . Film/video editor (Medical):   Marland Kitchen Lack of Transportation (Non-Medical):   Physical Activity:   . Days of Exercise per Week:   . Minutes of Exercise per Session:   Stress:   . Feeling of Stress :   Social Connections:   . Frequency of Communication with Friends and Family:   . Frequency of Social Gatherings with Friends and Family:   . Attends Religious Services:   . Active Member of Clubs or Organizations:   . Attends Archivist Meetings:   Marland Kitchen Marital Status:   Intimate Partner Violence:   . Fear of Current or Ex-Partner:   . Emotionally Abused:   Marland Kitchen Physically Abused:   . Sexually Abused:     FAMILY HISTORY:  Family History  Problem Relation Age of Onset  . Congenital heart disease Mother   . Bipolar disorder Mother   . Prostate cancer Brother   . Alzheimer's disease Paternal Grandmother   . Colon cancer Neg Hx     CURRENT MEDICATIONS:  Current Outpatient Medications  Medication Sig Dispense Refill  . diphenhydrAMINE (BENADRYL) 25 MG tablet Take 25 mg by mouth at bedtime.     . divalproex (DEPAKOTE) 250 MG DR tablet Take 250 mg by mouth at bedtime.     .  pantoprazole (PROTONIX) 40 MG tablet Take one tablet before breakfast daily. (Patient taking differently: Take 40 mg by mouth daily before breakfast. ) 90 tablet 3  . risperiDONE (RISPERDAL) 2 MG tablet Take 2 mg by mouth at bedtime.     . SYMBICORT 160-4.5 MCG/ACT inhaler Inhale 2 puffs into the lungs 2 (two) times daily.    Marland Kitchen albuterol (VENTOLIN HFA) 108 (90 Base) MCG/ACT inhaler Inhale 2 puffs into the lungs every 4 (four) hours as needed for wheezing or shortness of breath.  (Patient not taking: Reported on 07/17/2019)     No current facility-administered medications for this visit.  ALLERGIES:  Allergies  Allergen Reactions  . Penicillins Anaphylaxis    Throat swelled Did it involve swelling of the face/tongue/throat, SOB, or low BP? Yes Did it involve sudden or severe rash/hives, skin peeling, or any reaction on the inside of your mouth or nose? No Did you need to seek medical attention at a hospital or doctor's office? No When did it last happen?childhood allergy If all above answers are "NO", may proceed with cephalosporin use.   Marland Kitchen Amoxicillin     hallucinations Did it involve swelling of the face/tongue/throat, SOB, or low BP? No Did it involve sudden or severe rash/hives, skin peeling, or any reaction on the inside of your mouth or nose? No Did you need to seek medical attention at a hospital or doctor's office? Yes When did it last happen?July 2019 If all above answers are "NO", may proceed with cephalosporin use.   . Fish Allergy Nausea Only    PHYSICAL EXAM:  Performance status (ECOG): 1 - Symptomatic but completely ambulatory  Vitals:   07/17/19 1200  BP: 109/86  Pulse: 92  Resp: 18  Temp: 98.4 F (36.9 C)  SpO2: 98%   Wt Readings from Last 3 Encounters:  07/17/19 167 lb 8 oz (76 kg)  07/15/19 163 lb (73.9 kg)  06/26/19 181 lb (82.1 kg)   Physical Exam Vitals reviewed.  Constitutional:      Appearance: Normal appearance.  Cardiovascular:       Rate and Rhythm: Normal rate and regular rhythm.     Pulses: Normal pulses.     Heart sounds: Normal heart sounds.  Pulmonary:     Effort: Pulmonary effort is normal.     Breath sounds: Normal breath sounds.  Neurological:     General: No focal deficit present.     Mental Status: She is alert and oriented to person, place, and time.  Psychiatric:        Mood and Affect: Mood normal.        Behavior: Behavior normal.      LABORATORY DATA:  I have reviewed the labs as listed.  CBC Latest Ref Rng & Units 06/21/2019 06/20/2019 06/17/2019  WBC 4.0 - 10.5 K/uL 13.1(H) 14.6(H) 11.6(H)  Hemoglobin 12.0 - 15.0 g/dL 10.2(L) 11.5(L) 15.1(H)  Hematocrit 36 - 46 % 30.9(L) 35.7(L) 45.8  Platelets 150 - 400 K/uL 114(L) 147(L) 162   CMP Latest Ref Rng & Units 06/21/2019 06/20/2019 06/17/2019  Glucose 70 - 99 mg/dL 150(H) 102(H) 90  BUN 6 - 20 mg/dL 14 9 5(L)  Creatinine 0.44 - 1.00 mg/dL 0.76 0.72 0.66  Sodium 135 - 145 mmol/L 131(L) 135 139  Potassium 3.5 - 5.1 mmol/L 4.6 4.6 3.5  Chloride 98 - 111 mmol/L 95(L) 102 101  CO2 22 - 32 mmol/L 25 25 24   Calcium 8.9 - 10.3 mg/dL 8.5(L) 8.0(L) 9.6  Total Protein 6.5 - 8.1 g/dL 6.2(L) - 8.3(H)  Total Bilirubin 0.3 - 1.2 mg/dL 1.0 - 0.7  Alkaline Phos 38 - 126 U/L 46 - 72  AST 15 - 41 U/L 27 - 30  ALT 0 - 44 U/L 18 - 24    DIAGNOSTIC IMAGING:  I have independently reviewed the scans and discussed with the patient. DG Chest 1 View  Result Date: 06/30/2019 CLINICAL DATA:  Chest tube EXAM: CHEST  1 VIEW COMPARISON:  Chest radiograph from one day prior. FINDINGS: Stable right basilar chest tube. Stable cardiomediastinal silhouette with top-normal heart size. Small right apical pneumothorax, decreased. No left  pneumothorax. No pleural effusion. Improved aeration in the right lung with persistent asymmetric volume loss in the right hemithorax. Patchy right lung base opacity, improved. Clear left lung. Inter bronchial valves overlie the right hilum.  Stable subcutaneous emphysema in the lateral right chest wall. IMPRESSION: 1. Small right apical pneumothorax, decreased. Stable right basilar chest tube. 2. Improved aeration in the right lung with volume loss in the right hemithorax. Improved patchy right lung base opacity. Electronically Signed   By: Ilona Sorrel M.D.   On: 06/30/2019 07:33   DG Chest 1 View  Result Date: 06/29/2019 CLINICAL DATA:  Shortness of breath. EXAM: CHEST  1 VIEW COMPARISON:  Jun 28, 2019 FINDINGS: A right-sided chest tube remains in place. Subcutaneous air seen in the right chest wall. A right apical pneumothorax is suspected and stable. The left lung is clear. No left-sided pneumothorax. Shift of the mediastinum to the right is stable. No interval changes. Stable right effusion with underlying opacity. IMPRESSION: 1. Stable right chest tube. Suspected right apical pneumothorax, unchanged. Subcutaneous air in the right chest wall. No interval changes. 2. Persistent right-sided pleural effusion with underlying opacity. Electronically Signed   By: Dorise Bullion III M.D   On: 06/29/2019 11:35   DG Chest 2 View  Result Date: 07/15/2019 CLINICAL DATA:  54 year old female with history of adenocarcinoma of the lung. EXAM: CHEST - 2 VIEW COMPARISON:  Chest x-ray 07/02/2019. FINDINGS: Resolving subcutaneous emphysema in the right chest wall. Previously noted right apical pneumothorax is no longer confidently identified. Small right pleural effusion persists. Bronchial occluded devices project over the right hilar region. Patchy interstitial and airspace disease again noted throughout the lungs bilaterally (right greater than left) with significantly improved aeration overall when compared to the prior examination. The most significant areas of residual disease are in the right mid to lower lung. No left pleural effusion. No evidence of pulmonary edema. Heart size is normal. Upper mediastinal contours are within normal limits.  IMPRESSION: 1. Resolving multilobar bilateral pneumonia with residual disease throughout the right mid to lower lung. 2. Previously noted right-sided pneumothorax has resolved. 3. Persistent small right pleural effusion. Electronically Signed   By: Vinnie Langton M.D.   On: 07/15/2019 14:42   DG Chest 2 View  Result Date: 07/02/2019 CLINICAL DATA:  Chest tube removed ,sob today EXAM: CHEST - 2 VIEW COMPARISON:  Chest radiograph 07/01/2019 FINDINGS: Stable cardiomediastinal contours. Persistent small right apical pneumothorax. There are new airspace opacities throughout the left lung. Persistent right lower lobe airspace opacities noted small right pleural effusion. Right chest wall subcutaneous emphysema. Regional skeleton is unremarkable. IMPRESSION: 1. Persistent small right apical pneumothorax. 2. New airspace opacities throughout the left lung, concerning for infection or edema. Electronically Signed   By: Audie Pinto M.D.   On: 07/02/2019 09:58   DG Chest 2 View  Result Date: 06/17/2019 CLINICAL DATA:  Preadmit right lung surgery EXAM: CHEST - 2 VIEW COMPARISON:  CT 05/01/2019 FINDINGS: No acute opacity or pleural effusion. Normal heart size. No pneumothorax. Faintly visible right lower lobe lung nodule. IMPRESSION: No active cardiopulmonary disease.  Right lower lobe lung nodule. Electronically Signed   By: Donavan Foil M.D.   On: 06/17/2019 22:36   DG Chest Port 1 View  Result Date: 07/01/2019 CLINICAL DATA:  Right-sided chest tube removed. Status post partial lobectomy. EXAM: PORTABLE CHEST 1 VIEW COMPARISON:  One-view chest x-ray 07/01/2019 at 6:13 a.m. FINDINGS: The heart size is normal. Right apical pneumothorax is stable following removal  of right-sided chest tube. Subcutaneous emphysema is again noted, slightly improved. Right lower lobe airspace opacities have improved. IMPRESSION: 1. Stable right apical pneumothorax following removal of chest tube. 2. Improving subcutaneous  emphysema. 3. Improving right lower lobe airspace disease. Electronically Signed   By: San Morelle M.D.   On: 07/01/2019 10:31   DG Chest Port 1 View  Result Date: 07/01/2019 CLINICAL DATA:  Chest tube. EXAM: PORTABLE CHEST 1 VIEW COMPARISON:  Chest x-ray 06/30/2019. FINDINGS: Right chest tube in stable position. Small right apical pneumothorax again noted. Right chest wall subcutaneous emphysema again noted. Persistent right base atelectasis/infiltrate and small right pleural effusion. Intrabronchial valves again noted. Heart size normal. IMPRESSION: 1. Right chest tube in stable position. Small right apical pneumothorax is again noted. Right chest wall subcutaneous emphysema again noted. These findings are unchanged. 2. Persistent right base atelectasis/infiltrate and small right pleural effusion. Electronically Signed   By: Marcello Moores  Register   On: 07/01/2019 06:21   DG Chest Port 1 View  Result Date: 06/28/2019 CLINICAL DATA:  Status post lobectomy on the right EXAM: PORTABLE CHEST 1 VIEW COMPARISON:  06/27/2019 FINDINGS: Cardiac shadow is stable. Increasing opacity is noted within the right lung consistent with consolidation and likely mild effusion. Right chest tube is noted at the base. No pneumothorax is seen. Endobronchial valves are seen. Subcutaneous emphysema is again noted. Left lung remains clear. IMPRESSION: Increasing opacity on the right likely related to a combination of fluid and consolidation. Electronically Signed   By: Inez Catalina M.D.   On: 06/28/2019 09:24   DG Chest Port 1 View  Result Date: 06/27/2019 CLINICAL DATA:  Follow-up lobectomy of the right lung. EXAM: PORTABLE CHEST 1 VIEW COMPARISON:  One-view chest x-ray 06/26/2019 FINDINGS: Heart is mildly enlarged. Lung volumes remain low. Right-sided chest tube remains in place following thoracotomy. Small apical pneumothorax is noted. Subcutaneous emphysema is again noted on the right. Left lung is clear. Less extensive  subcutaneous emphysema is present over the left upper chest, improving. IMPRESSION: 1. Stable small right apical pneumothorax with chest tube in place. 2. Improving subcutaneous emphysema over the left upper chest. 3. Persistent low lung volumes. Electronically Signed   By: San Morelle M.D.   On: 06/27/2019 08:40   DG Chest Port 1 View  Result Date: 06/26/2019 CLINICAL DATA:  Shortness of breath after chest procedure EXAM: PORTABLE CHEST 1 VIEW COMPARISON:  06/26/2019 FINDINGS: Single frontal view of the chest demonstrates a stable cardiac silhouette. Right chest tube is seen at the right lung base, with evacuation of the previously seen right-sided pneumothorax. Extensive subcutaneous gas is again seen throughout the chest wall, right greater than left. There is patchy consolidation at the right lung base, stable. No acute bony abnormalities. IMPRESSION: 1. Stable position of right chest tube, with interval evacuation of the right-sided pneumothorax seen previously. 2. Persistent consolidation at the right lateral lung base which may be atelectasis. 3. Stable extensive subcutaneous emphysema. Electronically Signed   By: Randa Ngo M.D.   On: 06/26/2019 19:41   DG Chest Port 1 View  Result Date: 06/26/2019 CLINICAL DATA:  Pneumothorax. EXAM: PORTABLE CHEST 1 VIEW COMPARISON:  06/25/2019 FINDINGS: 0825 hours. Right basilar chest tube remains in place with persistent right-sided pneumothorax, similar to prior. Patchy airspace disease noted right base. Cardiopericardial silhouette is at upper limits of normal for size. Interstitial markings are diffusely coarsened with chronic features. The visualized bony structures of the thorax are intact. Diffuse subcutaneous emphysema again noted. Telemetry  leads overlie the chest. IMPRESSION: 1. No substantial interval change in exam. 2. Right basilar chest tube remains in place with persistent right-sided pneumothorax. 3. Similar patchy airspace opacity at  the right base. Electronically Signed   By: Misty Stanley M.D.   On: 06/26/2019 08:52   DG Chest Port 1 View  Result Date: 06/25/2019 CLINICAL DATA:  Postoperative lobectomy. Chest tube present. Pneumothorax. EXAM: PORTABLE CHEST 1 VIEW COMPARISON:  Jun 24, 2019 FINDINGS: Chest tube position unchanged. There is a persistent medial basilar pneumothorax on the right. This pneumothorax is also seen in the apical region, similar to 1 day prior. There is extensive subcutaneous air on the right. There is ill-defined airspace opacity in the right lower lung region. The left lung is clear. The heart size and pulmonary vascularity are normal. No adenopathy. No evident bone lesions. IMPRESSION: Stable chest tube placement with stable pneumothorax on the right. Extensive subcutaneous air on the right. Airspace consolidation in the right lower lung region is stable. No new opacity evident. Stable cardiac silhouette. Electronically Signed   By: Lowella Grip III M.D.   On: 06/25/2019 09:29   DG Chest Port 1 View  Result Date: 06/24/2019 CLINICAL DATA:  Follow-up chest tube EXAM: PORTABLE CHEST 1 VIEW COMPARISON:  06/23/2019 FINDINGS: Cardiac shadow is stable. Right-sided chest tube is again seen in the base. Previously seen pigtail catheter has been removed in the interval. The overall appearance of the right pneumothorax is stable. Left lung remains clear. No acute bony abnormality is noted. Diffuse subcutaneous emphysema is noted. IMPRESSION: Stable right pneumothorax with chest tube in place. Pigtail catheter has been removed in the interval. Electronically Signed   By: Inez Catalina M.D.   On: 06/24/2019 08:42   DG CHEST PORT 1 VIEW  Result Date: 06/23/2019 CLINICAL DATA:  pneumothorax, chest tube EXAM: PORTABLE CHEST 1 VIEW COMPARISON:  06/22/2019 FINDINGS: Right chest tubes remain in place. Small right apical pneumothorax noted, stable. Extensive subcutaneous emphysema throughout the chest wall. Patchy  airspace disease in the right mid and lower lung. Left lung clear. IMPRESSION: Right chest tubes remain in place with small residual right apical pneumothorax. Extensive bilateral subcutaneous emphysema. Patchy right mid and lower lung airspace opacities. Electronically Signed   By: Rolm Baptise M.D.   On: 06/23/2019 07:35   DG CHEST PORT 1 VIEW  Result Date: 06/22/2019 CLINICAL DATA:  Status post chest tube placement. EXAM: PORTABLE CHEST 1 VIEW COMPARISON:  Jun 22, 2019 (5:58 a.m.). FINDINGS: There is stable right-sided chest tube positioning. An additional right-sided chest tube has been placed, with its distal tip seen overlying the medial aspect of the right lung base. Mild, stable airspace disease is seen within the right lung base. There is no evidence of a pleural effusion or pneumothorax. The heart size and mediastinal contours are within normal limits. An extensive amount of subcutaneous emphysema is seen throughout the right chest wall and bilateral neck soft tissues. This is increased in severity when compared to the prior study. The visualized skeletal structures are unremarkable. IMPRESSION: 1. Interval placement of an additional right-sided chest tube. 2. Mild, stable right basilar airspace disease. 3. Extensive amount of subcutaneous emphysema, as described above. 4. Interval resolution of the right-sided pneumothorax seen on the prior study. Electronically Signed   By: Virgina Norfolk M.D.   On: 06/22/2019 15:32   DG CHEST PORT 1 VIEW  Result Date: 06/22/2019 CLINICAL DATA:  Follow-up pneumothorax EXAM: PORTABLE CHEST 1 VIEW COMPARISON:  06/21/2019 FINDINGS:  Cardiac shadow is stable. Right jugular central line is again seen and stable. There is been interval removal of the large bore chest tube on the right. Slight increase in pneumothorax is noted on the right particularly in the basilar and lateral component. Left lung remains clear. IMPRESSION: Increase in right-sided pneumothorax  following large bore chest tube removal. Electronically Signed   By: Inez Catalina M.D.   On: 06/22/2019 08:04   DG CHEST PORT 1 VIEW  Result Date: 06/21/2019 CLINICAL DATA:  Follow-up lobectomy EXAM: PORTABLE CHEST 1 VIEW COMPARISON:  06/20/2019 FINDINGS: The cardiac shadow is stable. Right jugular central line is again seen. Two chest tubes are noted on the right with persistent pneumothorax slightly larger than that seen on the prior exam. Right basilar atelectasis is seen. Left lung remains clear. No bony abnormality is noted. IMPRESSION: Slight increase in right-sided pneumothorax. Chest tubes on the right stable in appearance from the prior exam. Electronically Signed   By: Inez Catalina M.D.   On: 06/21/2019 09:02   DG CHEST PORT 1 VIEW  Result Date: 06/20/2019 CLINICAL DATA:  Lobectomy EXAM: PORTABLE CHEST 1 VIEW COMPARISON:  Yesterday FINDINGS: Small right apical pneumothorax with chest tubes in place. Right IJ line with tip at the upper cavoatrial junction. Normal heart size. Atelectasis at the left base and likely at the right base. IMPRESSION: Stable postoperative chest including small right apical pneumothorax. Electronically Signed   By: Monte Fantasia M.D.   On: 06/20/2019 06:45   DG Chest Port 1 View  Result Date: 06/19/2019 CLINICAL DATA:  Follow-up lobectomy EXAM: PORTABLE CHEST 1 VIEW COMPARISON:  06/17/2019 FINDINGS: Right internal jugular central line tip is at the SVC RA junction. Two right chest tubes are in place. Small amount of pleural air at the apex. Mild atelectasis at both lung bases. IMPRESSION: Status post lobectomy on the right. Two chest tubes in place. Small amount of pleural air at the right apex. Mild bibasilar atelectasis. Electronically Signed   By: Nelson Chimes M.D.   On: 06/19/2019 13:04   DG C-Arm 1-60 Min-No Report  Result Date: 06/26/2019 CLINICAL DATA:  Pneumothorax. EXAM: Portable chest x-ray dated 06/26/2019 at 1:26 p.m. TECHNIQUE: AP portable test  semi-erect COMPARISON:  Chest x-ray dated 06/25/2019 at 6:39 a.m. FINDINGS: The patient has undergone insertion of bronchial occlusion devices in the region of the right hilum. Right pneumothorax is now resolved. Right chest tube remains in good position at the right lung base. Improved aeration at the right lung base with a focal residual area of infiltrate/atelectasis in the right midzone. Stable subcutaneous emphysema. Heart size and vascularity are normal. Small focal area of atelectasis or infiltrate in the left upper lung zone and adjacent to the superior aspect of the left hilum. IMPRESSION: Resolution of right pneumothorax after bronchial occlusion device insertion. Electronically Signed   By: Lorriane Shire M.D.   On: 06/26/2019 15:18     ASSESSMENT:  1.  Stage IIIa (PT1CPN2) right lung adenocarcinoma: -PET scan on 05/13/2019 showed right lower lobe nodule concerning for bronchogenic carcinoma.  Cirrhotic liver. -Right lower lobectomy and lymph node excision on 06/19/2019. -Pathology showed 1.7 cm right lower lobe adenocarcinoma, unifocal, lymphovascular invasion present.  Margins negative.  2/14 lymph nodes positive (level 7 and 10R).  PT1CPN2.   PLAN:  1.  Stage III right lung adenocarcinoma: -We have discussed pathology report in detail. -I have recommended adjuvant chemotherapy with 4 cycles of platinum and pemetrexed. -Given her cirrhosis and other comorbidities,  I do not believe she is a candidate for cisplatin even though her creatinine is normal.  Hence we have recommended carboplatin and pemetrexed. -We will ask for port placement because of difficult venous access. -She would like to start treatment after the bronchial valve is taken out by Dr. Roxan Hockey. -We will give her B12 injection in preparation for chemotherapy.  We will also start her on folic acid 1 mg tablet daily. -I have recommended sending for foundation 1 testing.  She will be candidate for osimertinib after  chemotherapy if she has EGFR mutation.  We will also send for PD-L1 testing.  2.  Immune mediated thrombocytopenia: -She was treated for hepatitis C and was reportedly told that she was cured.  She has cirrhosis. -Latest platelet count on 06/21/2019 was 114.   Orders placed this encounter:  No orders of the defined types were placed in this encounter.  Total time spent 40 minutes with more than 50% of the time spent discussing diagnosis, pathology report, treatment plan, adverse effects, counseling and coordination of care.  Derek Jack, MD [place] [phone]   I, Milinda Antis, am acting as a scribe for Dr. Sanda Linger.  I, Derek Jack MD, have reviewed the above documentation for accuracy and completeness, and I agree with the above.

## 2019-07-17 NOTE — Patient Instructions (Signed)
[place] at [facility] Discharge Instructions  You were seen today by Dr. Delton Coombes. He went over your recent results. You will be referred to a general surgeon for port placement. You will receive a vitamin B12 injection in 3 weeks. Dr. Delton Coombes will see you back after your valves come out for labs and follow up.   Thank you for choosing [place] at [facility] to provide your oncology and hematology care.  To afford each patient quality time with our provider, please arrive at least 15 minutes before your scheduled appointment time.   If you have a lab appointment with the [facility] please come in thru the Main Entrance and check in at the main information desk  You need to re-schedule your appointment should you arrive 10 or more minutes late.  We strive to give you quality time with our providers, and arriving late affects you and other patients whose appointments are after yours.  Also, if you no show three or more times for appointments you may be dismissed from the clinic at the providers discretion.     Again, thank you for choosing [facility].  Our hope is that these requests will decrease the amount of time that you wait before being seen by our physicians.       _____________________________________________________________  Should you have questions after your visit to [facility], please contact our office at [phone] between the hours of 8:00 a.m. and 4:30 p.m.  Voicemails left after 4:00 p.m. will not be returned until the following business day.  For prescription refill requests, have your pharmacy contact our office and allow 72 hours.    Cancer Center Support Programs:   > Cancer Support Group  2nd Tuesday of the month 1pm-2pm, Journey Room

## 2019-07-17 NOTE — Progress Notes (Signed)
I spoke with Rachel Gross in pathology and added on PDL1 testing to the already ordered Foundation One.  She states that she will call the company and add on today.

## 2019-07-23 ENCOUNTER — Other Ambulatory Visit: Payer: Self-pay | Admitting: *Deleted

## 2019-07-23 DIAGNOSIS — G8918 Other acute postprocedural pain: Secondary | ICD-10-CM

## 2019-07-23 MED ORDER — TRAMADOL HCL 50 MG PO TABS: 50.0000 mg | ORAL_TABLET | Freq: Four times a day (QID) | ORAL | 0 refills | Status: DC | PRN

## 2019-07-24 ENCOUNTER — Encounter: Payer: Self-pay | Admitting: General Surgery

## 2019-07-24 ENCOUNTER — Other Ambulatory Visit: Payer: Self-pay

## 2019-07-24 ENCOUNTER — Ambulatory Visit (INDEPENDENT_AMBULATORY_CARE_PROVIDER_SITE_OTHER): Payer: Medicaid Other | Admitting: General Surgery

## 2019-07-24 VITALS — BP 107/74 | HR 93 | Temp 96.8°F | Resp 20 | Ht 61.0 in | Wt 169.0 lb

## 2019-07-24 DIAGNOSIS — R5381 Other malaise: Secondary | ICD-10-CM

## 2019-07-24 DIAGNOSIS — C3431 Malignant neoplasm of lower lobe, right bronchus or lung: Secondary | ICD-10-CM

## 2019-07-24 DIAGNOSIS — R0789 Other chest pain: Secondary | ICD-10-CM | POA: Diagnosis not present

## 2019-07-24 NOTE — Patient Instructions (Signed)

## 2019-07-25 NOTE — H&P (Signed)
Rachel Gross; [id number]; 08/14/1965   HPI Patient is a 54 year old white female who was referred to my care by Dr. Delton Coombes for Port-A-Cath insertion.  She has right lung carcinoma and is in need for central venous access for chemotherapy Past Medical History:  Diagnosis Date  . Alcoholic hepatitis without ascites   . Anxiety   . Arthritis    knees, hands  . 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 Bipolar disorder ([place])   . Cancer ([place])    right lung  . COPD (chronic obstructive pulmonary disease) ([place])   . Depression   . Emphysema lung ([place])   . GERD (gastroesophageal reflux disease)   . Hepatitis C    s/p treatment with Epclusa  . History of HPV infection   . History of pneumonia   . Pneumonia   . Smoker     Past Surgical History:  Procedure Laterality Date  . BIOPSY  05/02/2018   Procedure: BIOPSY;  Surgeon: Daneil Dolin, MD;  [facility];  Service: Endoscopy;;  gastric  . CESAREAN SECTION    . CHEST TUBE INSERTION Right 06/26/2019   Procedure: Right CHEST TUBE REPLACEMENT;  Surgeon: [name], MD;  Location: [place];  Service: Thoracic;  Laterality: Right;  . COLONOSCOPY WITH PROPOFOL N/A 03/03/2019   Procedure: COLONOSCOPY WITH PROPOFOL;  Surgeon: Daneil Dolin, MD;  [facility];  Service: Endoscopy;  Laterality: N/A;  9:15am  . ESOPHAGOGASTRODUODENOSCOPY (EGD) WITH PROPOFOL N/A 05/02/2018   Procedure: ESOPHAGOGASTRODUODENOSCOPY (EGD) WITH PROPOFOL;  Surgeon: Daneil Dolin, MD;  [facility];  Service: Endoscopy;  Laterality: N/A;  8:30am  . EYE SURGERY Bilateral    cataract  . HEMOSTASIS CLIP PLACEMENT  03/03/2019   Procedure: HEMOSTASIS CLIP PLACEMENT;  Surgeon: Daneil Dolin, MD;  [facility];  Service: Endoscopy;;  . INTERCOSTAL NERVE BLOCK Right 06/19/2019   Procedure: Intercostal Nerve Block;  Surgeon: [name], MD;  Location: [place];  Service: Thoracic;  Laterality:  Right;  . IR US GUIDE VASC ACCESS RIGHT  03/13/2018  . LYMPH NODE DISSECTION Right 06/19/2019   Procedure: Lymph Node Dissection;  Surgeon: [name], MD;  Location: [place];  Service: Thoracic;  Laterality: Right;  . POLYPECTOMY  03/03/2019   Procedure: POLYPECTOMY;  Surgeon: Daneil Dolin, MD;  [facility];  Service: Endoscopy;;  . TONSILLECTOMY    . TOOTH EXTRACTION  01/31/2018   x 7  . VIDEO BRONCHOSCOPY WITH INSERTION OF INTERBRONCHIAL VALVE (IBV) N/A 06/26/2019   Procedure: VIDEO BRONCHOSCOPY WITH INSERTION OF TWO INTERBRONCHIAL VALVE (IBV);  Surgeon: [name], MD;  [facility]: [facility];  Service: Thoracic;  Laterality: N/A;    Family History  Problem Relation Age of Onset  . Congenital heart disease Mother   . Bipolar disorder Mother   . Prostate cancer Brother   . Alzheimer's disease Paternal Grandmother   . Colon cancer Neg Hx     Current Outpatient Medications on File Prior to Visit  Medication Sig Dispense Refill  . albuterol (VENTOLIN HFA) 108 (90 Base) MCG/ACT inhaler Inhale 2 puffs into the lungs every 4 (four) hours as needed for wheezing or shortness of breath.     . diphenhydrAMINE (BENADRYL) 25 MG tablet Take 25 mg by mouth at bedtime.     . divalproex (DEPAKOTE) 250 MG DR tablet Take 250 mg by mouth at bedtime.     . pantoprazole (PROTONIX)  40 MG tablet Take one tablet before breakfast daily. (Patient taking differently: Take 40 mg by mouth daily before breakfast. ) 90 tablet 3  . risperiDONE (RISPERDAL) 2 MG tablet Take 2 mg by mouth at bedtime.     . SYMBICORT 160-4.5 MCG/ACT inhaler Inhale 2 puffs into the lungs 2 (two) times daily.    . traMADol (ULTRAM) 50 MG tablet Take 1 tablet (50 mg total) by mouth every 6 (six) hours as needed. 28 tablet 0   No current facility-administered medications on file prior to visit.    Allergies  Allergen Reactions  . Penicillins Anaphylaxis    Throat swelled Did it involve swelling of the  face/tongue/throat, SOB, or low BP? Yes Did it involve sudden or severe rash/hives, skin peeling, or any reaction on the inside of your mouth or nose? No Did you need to seek medical attention at a hospital or doctor's office? No When did it last happen?childhood allergy If all above answers are "NO", may proceed with cephalosporin use.   Marland Kitchen Amoxicillin     hallucinations Did it involve swelling of the face/tongue/throat, SOB, or low BP? No Did it involve sudden or severe rash/hives, skin peeling, or any reaction on the inside of your mouth or nose? No Did you need to seek medical attention at a hospital or doctor's office? Yes When did it last happen?July 2019 If all above answers are "NO", may proceed with cephalosporin use.   . Fish Allergy Nausea Only    Social History   Substance and Sexual Activity  Alcohol Use Not Currently   Comment: Last use of alcohol 03/2016    Social History   Tobacco Use  Smoking Status Former Smoker  . Packs/day: 0.50  . Years: 38.00  . Pack years: 19.00  . Types: Cigarettes  . Quit date: 06/19/2019  . Years since quitting: 0.0  Smokeless Tobacco Never Used    Review of Systems  Constitutional: Positive for malaise/fatigue.  HENT: Positive for sinus pain.   Eyes: Negative.   Respiratory: Positive for shortness of breath and wheezing.   Cardiovascular: Positive for chest pain.  Gastrointestinal: Positive for heartburn.  Genitourinary: Negative.   Musculoskeletal: Negative.   Skin: Negative.   Neurological: Positive for tremors and sensory change.  Endo/Heme/Allergies: Negative.   Psychiatric/Behavioral: The patient is nervous/anxious.     Objective   Vitals:   07/24/19 0953  BP: 107/74  Pulse: 93  Resp: 20  Temp: (!) 96.8 F (36 C)  SpO2: 94%    Physical Exam Vitals reviewed.  Constitutional:      Appearance: Normal appearance.  HENT:     Head: Normocephalic and atraumatic.  Cardiovascular:     Rate and  Rhythm: Normal rate and regular rhythm.     Heart sounds: Normal heart sounds. No murmur heard.  No friction rub. No gallop.   Pulmonary:     Effort: Pulmonary effort is normal. No respiratory distress.     Breath sounds: No stridor. Wheezing present. No rhonchi or rales.  Skin:    General: Skin is warm and dry.  Neurological:     Mental Status: She is alert and oriented to person, place, and time.   Dr. Tomie China notes reviewed  Assessment  Right lung carcinoma, need for central venous access Plan   Patient is scheduled for Port-A-Cath insertion on 08/18/2019.  The risks and benefits of the procedure including bleeding, infection, and pneumothorax were fully explained to the patient, who gave informed consent.

## 2019-07-25 NOTE — Progress Notes (Signed)
Rachel Gross; [id number]; Jul 01, 1965   HPI Patient is a 54 year old white female who was referred to my care by Dr. Delton Coombes for Port-A-Cath insertion.  She has right lung carcinoma and is in need for central venous access for chemotherapy Past Medical History:  Diagnosis Date  . Alcoholic hepatitis without ascites   . Anxiety   . Arthritis    knees, hands  . 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 Bipolar disorder ([place])   . Cancer ([place])    right lung  . COPD (chronic obstructive pulmonary disease) ([place])   . Depression   . Emphysema lung ([place])   . GERD (gastroesophageal reflux disease)   . Hepatitis C    s/p treatment with Epclusa  . History of HPV infection   . History of pneumonia   . Pneumonia   . Smoker     Past Surgical History:  Procedure Laterality Date  . BIOPSY  05/02/2018   Procedure: BIOPSY;  Surgeon: Daneil Dolin, MD;  [facility];  Service: Endoscopy;;  gastric  . CESAREAN SECTION    . CHEST TUBE INSERTION Right 06/26/2019   Procedure: Right CHEST TUBE REPLACEMENT;  Surgeon: [name], MD;  Location: [place];  Service: Thoracic;  Laterality: Right;  . COLONOSCOPY WITH PROPOFOL N/A 03/03/2019   Procedure: COLONOSCOPY WITH PROPOFOL;  Surgeon: Daneil Dolin, MD;  [facility];  Service: Endoscopy;  Laterality: N/A;  9:15am  . ESOPHAGOGASTRODUODENOSCOPY (EGD) WITH PROPOFOL N/A 05/02/2018   Procedure: ESOPHAGOGASTRODUODENOSCOPY (EGD) WITH PROPOFOL;  Surgeon: Daneil Dolin, MD;  [facility];  Service: Endoscopy;  Laterality: N/A;  8:30am  . EYE SURGERY Bilateral    cataract  . HEMOSTASIS CLIP PLACEMENT  03/03/2019   Procedure: HEMOSTASIS CLIP PLACEMENT;  Surgeon: Daneil Dolin, MD;  [facility];  Service: Endoscopy;;  . INTERCOSTAL NERVE BLOCK Right 06/19/2019   Procedure: Intercostal Nerve Block;  Surgeon: [name], MD;  Location: [place];  Service: Thoracic;  Laterality:  Right;  . IR US GUIDE VASC ACCESS RIGHT  03/13/2018  . LYMPH NODE DISSECTION Right 06/19/2019   Procedure: Lymph Node Dissection;  Surgeon: [name], MD;  Location: [place];  Service: Thoracic;  Laterality: Right;  . POLYPECTOMY  03/03/2019   Procedure: POLYPECTOMY;  Surgeon: Daneil Dolin, MD;  [facility];  Service: Endoscopy;;  . TONSILLECTOMY    . TOOTH EXTRACTION  01/31/2018   x 7  . VIDEO BRONCHOSCOPY WITH INSERTION OF INTERBRONCHIAL VALVE (IBV) N/A 06/26/2019   Procedure: VIDEO BRONCHOSCOPY WITH INSERTION OF TWO INTERBRONCHIAL VALVE (IBV);  Surgeon: [name], MD;  Location: [facility] OR;  Service: Thoracic;  Laterality: N/A;    Family History  Problem Relation Age of Onset  . Congenital heart disease Mother   . Bipolar disorder Mother   . Prostate cancer Brother   . Alzheimer's disease Paternal Grandmother   . Colon cancer Neg Hx     Current Outpatient Medications on File Prior to Visit  Medication Sig Dispense Refill  . albuterol (VENTOLIN HFA) 108 (90 Base) MCG/ACT inhaler Inhale 2 puffs into the lungs every 4 (four) hours as needed for wheezing or shortness of breath.     . diphenhydrAMINE (BENADRYL) 25 MG tablet Take 25 mg by mouth at bedtime.     . divalproex (DEPAKOTE) 250 MG DR tablet Take 250 mg by mouth at bedtime.     . pantoprazole (PROTONIX)  40 MG tablet Take one tablet before breakfast daily. (Patient taking differently: Take 40 mg by mouth daily before breakfast. ) 90 tablet 3  . risperiDONE (RISPERDAL) 2 MG tablet Take 2 mg by mouth at bedtime.     . SYMBICORT 160-4.5 MCG/ACT inhaler Inhale 2 puffs into the lungs 2 (two) times daily.    . traMADol (ULTRAM) 50 MG tablet Take 1 tablet (50 mg total) by mouth every 6 (six) hours as needed. 28 tablet 0   No current facility-administered medications on file prior to visit.    Allergies  Allergen Reactions  . Penicillins Anaphylaxis    Throat swelled Did it involve swelling of the  face/tongue/throat, SOB, or low BP? Yes Did it involve sudden or severe rash/hives, skin peeling, or any reaction on the inside of your mouth or nose? No Did you need to seek medical attention at a hospital or doctor's office? No When did it last happen?childhood allergy If all above answers are "NO", may proceed with cephalosporin use.   Marland Kitchen Amoxicillin     hallucinations Did it involve swelling of the face/tongue/throat, SOB, or low BP? No Did it involve sudden or severe rash/hives, skin peeling, or any reaction on the inside of your mouth or nose? No Did you need to seek medical attention at a hospital or doctor's office? Yes When did it last happen?July 2019 If all above answers are "NO", may proceed with cephalosporin use.   . Fish Allergy Nausea Only    Social History   Substance and Sexual Activity  Alcohol Use Not Currently   Comment: Last use of alcohol 03/2016    Social History   Tobacco Use  Smoking Status Former Smoker  . Packs/day: 0.50  . Years: 38.00  . Pack years: 19.00  . Types: Cigarettes  . Quit date: 06/19/2019  . Years since quitting: 0.0  Smokeless Tobacco Never Used    Review of Systems  Constitutional: Positive for malaise/fatigue.  HENT: Positive for sinus pain.   Eyes: Negative.   Respiratory: Positive for shortness of breath and wheezing.   Cardiovascular: Positive for chest pain.  Gastrointestinal: Positive for heartburn.  Genitourinary: Negative.   Musculoskeletal: Negative.   Skin: Negative.   Neurological: Positive for tremors and sensory change.  Endo/Heme/Allergies: Negative.   Psychiatric/Behavioral: The patient is nervous/anxious.     Objective   Vitals:   07/24/19 0953  BP: 107/74  Pulse: 93  Resp: 20  Temp: (!) 96.8 F (36 C)  SpO2: 94%    Physical Exam Vitals reviewed.  Constitutional:      Appearance: Normal appearance.  HENT:     Head: Normocephalic and atraumatic.  Cardiovascular:     Rate and  Rhythm: Normal rate and regular rhythm.     Heart sounds: Normal heart sounds. No murmur heard.  No friction rub. No gallop.   Pulmonary:     Effort: Pulmonary effort is normal. No respiratory distress.     Breath sounds: No stridor. Wheezing present. No rhonchi or rales.  Skin:    General: Skin is warm and dry.  Neurological:     Mental Status: She is alert and oriented to person, place, and time.   Dr. Tomie China notes reviewed  Assessment  Right lung carcinoma, need for central venous access Plan   Patient is scheduled for Port-A-Cath insertion on 08/18/2019.  The risks and benefits of the procedure including bleeding, infection, and pneumothorax were fully explained to the patient, who gave informed consent.

## 2019-07-28 ENCOUNTER — Encounter (HOSPITAL_COMMUNITY): Payer: Self-pay | Admitting: Thoracic Surgery (Cardiothoracic Vascular Surgery)

## 2019-07-30 ENCOUNTER — Other Ambulatory Visit (HOSPITAL_COMMUNITY): Payer: Self-pay | Admitting: *Deleted

## 2019-07-30 MED ORDER — FOLIC ACID 1 MG PO TABS
1.0000 mg | ORAL_TABLET | Freq: Every day | ORAL | 1 refills | Status: DC
Start: 2019-07-30 — End: 2019-08-12

## 2019-07-30 NOTE — Progress Notes (Signed)
Patient will be starting Al[place], folic acid sent to her pharmacy.

## 2019-07-31 ENCOUNTER — Telehealth: Payer: Self-pay

## 2019-07-31 NOTE — Telephone Encounter (Signed)
Patient contacted the office to have her oxygen picked up.  Faxed order per patient's request to have O2 picked up by [place].

## 2019-08-05 ENCOUNTER — Other Ambulatory Visit: Payer: Self-pay

## 2019-08-05 ENCOUNTER — Encounter (HOSPITAL_COMMUNITY)
Admission: RE | Admit: 2019-08-05 | Discharge: 2019-08-05 | Disposition: A | Discharge status: Home / Self Care | Payer: Medicaid Other | Source: Ambulatory Visit | Attending: Thoracic Surgery (Cardiothoracic Vascular Surgery) | Admitting: Thoracic Surgery (Cardiothoracic Vascular Surgery)

## 2019-08-05 ENCOUNTER — Encounter (HOSPITAL_COMMUNITY): Payer: Self-pay

## 2019-08-05 ENCOUNTER — Ambulatory Visit (HOSPITAL_COMMUNITY)
Admission: RE | Admit: 2019-08-05 | Discharge: 2019-08-05 | Disposition: A | Discharge status: Home / Self Care | Payer: Medicaid Other | Source: Ambulatory Visit | Attending: Thoracic Surgery (Cardiothoracic Vascular Surgery) | Admitting: Thoracic Surgery (Cardiothoracic Vascular Surgery)

## 2019-08-05 ENCOUNTER — Other Ambulatory Visit (HOSPITAL_COMMUNITY)
Admission: RE | Admit: 2019-08-05 | Discharge: 2019-08-05 | Disposition: A | Discharge status: Home / Self Care | Payer: Medicaid Other | Source: Ambulatory Visit | Attending: Thoracic Surgery (Cardiothoracic Vascular Surgery) | Admitting: Thoracic Surgery (Cardiothoracic Vascular Surgery)

## 2019-08-05 DIAGNOSIS — Z20822 Contact with and (suspected) exposure to covid-19: Secondary | ICD-10-CM | POA: Diagnosis not present

## 2019-08-05 DIAGNOSIS — Z01818 Encounter for other preprocedural examination: Secondary | ICD-10-CM | POA: Diagnosis present

## 2019-08-05 DIAGNOSIS — J9382 Other air leak: Secondary | ICD-10-CM

## 2019-08-05 HISTORY — DX: Dyspnea, unspecified: R06.00

## 2019-08-05 LAB — COMPREHENSIVE METABOLIC PANEL
ALT: 16 U/L (ref 0–44)
AST: 25 U/L (ref 15–41)
Albumin: 4.1 g/dL (ref 3.5–5.0)
Alkaline Phosphatase: 69 U/L (ref 38–126)
Anion gap: 12 (ref 5–15)
BUN: <5 mg/dL — ABNORMAL LOW (ref 6–20)
CO2: 25 mmol/L (ref 22–32)
Calcium: 9.8 mg/dL (ref 8.9–10.3)
Chloride: 99 mmol/L (ref 98–111)
Creatinine, Ser: 0.62 mg/dL (ref 0.44–1.00)
GFR calc Af Amer: >60 mL/min (ref >60)
GFR calc non Af Amer: >60 mL/min (ref >60)
Glucose, Bld: 90 mg/dL (ref 70–99)
Potassium: 3.6 mmol/L (ref 3.5–5.1)
Sodium: 136 mmol/L (ref 135–145)
Total Bilirubin: 0.6 mg/dL (ref 0.3–1.2)
Total Protein: 7.8 g/dL (ref 6.5–8.1)

## 2019-08-05 LAB — APTT: aPTT: 28 seconds (ref 24–36)

## 2019-08-05 LAB — CBC
HCT: 43.4 % (ref 36.0–46.0)
Hemoglobin: 13.4 g/dL (ref 12.0–15.0)
MCH: 27 pg (ref 26.0–34.0)
MCHC: 30.9 g/dL (ref 30.0–36.0)
MCV: 87.5 fL (ref 80.0–100.0)
Platelets: 185 10*3/uL (ref 150–400)
RBC: 4.96 MIL/uL (ref 3.87–5.11)
RDW: 13.8 % (ref 11.5–15.5)
WBC: 7.4 10*3/uL (ref 4.0–10.5)
nRBC: 0 % (ref 0.0–0.2)

## 2019-08-05 LAB — PROTIME-INR
INR: 1 (ref 0.8–1.2)
Prothrombin Time: 13.2 seconds (ref 11.4–15.2)

## 2019-08-05 LAB — SARS CORONAVIRUS 2 (TAT 6-24 HRS): SARS Coronavirus 2: NEGATIVE

## 2019-08-05 MED ORDER — CHLORHEXIDINE GLUCONATE CLOTH 2 % EX PADS: 6.0000 | MEDICATED_PAD | Freq: Once | CUTANEOUS | Status: DC

## 2019-08-05 NOTE — Progress Notes (Signed)
PCP - DR Nevada Crane Cardiologist - NA -    Chest x-ray - 08/05/19 EKG - 5/21 Stress Test - NA ECHO - NA Cardiac Cath - NA   ons: Aspirin Instructions:STOP    COVID TEST- TODAY   Anesthesia review: CXR  Patient denies shortness of breath, fever, cough and chest pain at PAT appointment   All instructions explained to the patient, with a verbal understanding of the material. Patient agrees to go over the instructions while at home for a better understanding. Patient also instructed to self quarantine after being tested for COVID-19. The opportunity to ask questions was provided.

## 2019-08-05 NOTE — Pre-Procedure Instructions (Signed)
Arleene Settle  08/05/2019      [id number] - [place], [place] - [address] [place] [address] Phone: [phone] Fax: [phone]    Your procedure is scheduled on 08/08/19.  Report to [facility] Admitting at 530 A.M.  Call this number if you have problems the morning of surgery:  [phone]   Remember:  Do not eat or drink after midnight.  OF WATER ----  albuterol (VENTOLIN HFA) 108 (90 Base) MCG/ACT  pantoprazole (PROTONIX) 40 MG tablet SYMBICORT 160-4.5 MCG/ACT inhaler SYMBICORT 160-4.5 MCG/ACT inhaler    Do not wear jewelry, make-up or nail polish.  Do not wear lotions, powders, or perfumes, or deodorant.  Do not shave 48 hours prior to surgery.  Men may shave face and neck.  Do not bring valuables to the hospital.  [facility] is not responsible for any belongings or valuables.  Contacts, dentures or bridgework may not be worn into surgery.  Leave your suitcase in the car.  After surgery it may be brought to your room.  For patients admitted to the hospital, discharge time will be determined by your treatment team.  Patients discharged the day of surgery will not be allowed to drive home.  Do not take any aspirin,anti-inflammatories,vitamins,or herbal supplements 5-7 days prior to surgery.Do not take any  [place] - Preparing for Surgery  Before surgery, you can play an important role.  Because skin is not sterile, your skin needs to be as free of germs as possible.  You can reduce the number of germs on you skin by washing with CHG (chlorahexidine gluconate) soap before surgery.  CHG is an antiseptic cleaner which kills germs and bonds with the skin to continue killing germs even after washing.  Oral Hygiene is also important in reducing the risk of infection.  Remember to brush your teeth with your regular toothpaste the morning of surgery.  Please DO NOT use if you have  an allergy to CHG or antibacterial soaps.  If your skin becomes reddened/irritated stop using the CHG and inform your nurse when you arrive at Short Stay.  Do not shave (including legs and underarms) for at least 48 hours prior to the first CHG shower.  You may shave your face.  Please follow these instructions carefully:   1.  Shower with CHG Soap the night before surgery and the morning of Surgery.  2.  If you choose to wash your hair, wash your hair first as usual with your normal shampoo.  3.  After you shampoo, rinse your hair and body thoroughly to remove the shampoo. 4.  Use CHG as you would any other liquid soap.  You can apply chg directly to the skin and wash gently with a      scrungie or washcloth.           5.  Apply the CHG Soap to your body ONLY FROM THE NECK DOWN.   Do not use on open wounds or open sores. Avoid contact with your eyes, ears, mouth and genitals (private parts).  Wash genitals (private parts) with your normal soap.  6.  Wash thoroughly, paying special attention to the area where your surgery will be performed.  7.  Thoroughly rinse your body with warm water from the neck down.  8.  DO NOT shower/wash with your normal soap after using and rinsing off the  CHG Soap.  9.  Pat yourself dry with a clean towel.            10.  Wear clean pajamas.            11.  Place clean sheets on your bed the night of your first shower and do not sleep with pets.  Day of Surgery  Do not apply any lotions/deoderants the morning of surgery.   Please wear clean clothes to the hospital/surgery center. Remember to brush your teeth with toothpaste.

## 2019-08-06 ENCOUNTER — Other Ambulatory Visit (HOSPITAL_COMMUNITY): Payer: Medicaid Other

## 2019-08-06 ENCOUNTER — Encounter (HOSPITAL_COMMUNITY): Payer: Self-pay | Admitting: Physician Assistant

## 2019-08-06 NOTE — Progress Notes (Signed)
Anesthesia Chart Review:  Hx of hepatic cirrhosis, hep C s/p treatment, portal hypertensive gastropathy, thrombocytopenia followed by GI. 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 Historically her liver disease has been well compensated with a meld of 8 most recently. At this point she is due for updated liver labs. Right upper quadrant ultrasound was completed about 2 months ago and obvious liver lesion. EGD for variceal screening up-to-date. No overt hepatic or GI symptoms today. Update labs, follow-up in 6 months"  CT of the abdomen in February 2020 showed a 1.1 x 0.9 x 0.7 cm right lower lobe pulmonary nodule.  She had a chest CT in April 2021 which showed the nodule increased in size to 1.4 x 1.3 cm. There was no adenopathy. PET/CT showed the lower lobe nodule was hypermetabolic with an SUV of 4. No evidence of regional or distant metastases.  She is s/p right lower lobectomy and node dissection on 06/19/2019. 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 Her postoperative course was complicated by an air leak.  Intrabronchial valves were placed.  She finally went home on day 12. She did go home on oxygen but was able to discontinue after discharge. Per last followup with Dr. Roxan Hockey 07/15/19, "She is doing well.  She does get short of breath with exertion.  She is no longer on oxygen.  She has some discomfort but is no longer taking any narcotics."  Preop labs reviewed, unremarkable.   EKG 06/17/19: Normal sinus rhythm. Rate 75. Cannot rule out Anterior infarct , age undetermined. No significant change since 2019  CHEST - 2 VIEW 08/05/19: COMPARISON:  07/15/2019, 07/02/2019, 07/01/2019  FINDINGS: Postsurgical changes in the right perihilar lung. Small residual right pleural effusion with fluid along the fissure, decreased since 07/15/2019. Left lung is clear. Small right apical  pneumothorax, similar in size as compared with 07/02/2019, possibly obscured on 07/15/2019 by small amount of hazy fluid at the apex. Stable cardiomediastinal silhouette with aortic atherosclerosis.  IMPRESSION: 1. Small right apical pneumothorax with small residual right pleural effusion but with improved aeration at the right lung base since 07/15/2019. Apical pneumothorax is probably not significantly changed in size as compared with 07/02/2019.  PFTs 05/17/19: FVC-%Pred-Pre Latest Units: % 70  FEV1-%Pred-Pre Latest Units: % 64  FEV1FVC-%Pred-Pre Latest Units: % 90  DLCO unc % pred Latest Units: % [address]    Wynonia Musty [facility]/Anesthesiology Phone [phone] 08/06/2019 3:51 PM

## 2019-08-06 NOTE — Anesthesia Preprocedure Evaluation (Deleted)
Anesthesia Evaluation    Airway        Dental   Pulmonary Patient abstained from smoking., former smoker,           Cardiovascular      Neuro/Psych    GI/Hepatic   Endo/Other    Renal/GU      Musculoskeletal   Abdominal   Peds  Hematology   Anesthesia Other Findings   Reproductive/Obstetrics                             Anesthesia Physical Anesthesia Plan  ASA:   Anesthesia Plan:    Post-op Pain Management:    Induction:   PONV Risk Score and Plan:   Airway Management Planned:   Additional Equipment:   Intra-op Plan:   Post-operative Plan:   Informed Consent:   Plan Discussed with:   Anesthesia Plan Comments: (PAT note by Karoline Caldwell, PA-C: Hx of hepatic cirrhosis, hep C s/p treatment, portal hypertensive gastropathy, thrombocytopenia followed by GI. 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 Historically her liver disease has been well compensated with a meld of 8 most recently. At this point she is due for updated liver labs. Right upper quadrant ultrasound was completed about 2 months ago and obvious liver lesion. EGD for variceal screening up-to-date. No overt hepatic or GI symptoms today. Update labs, follow-up in 6 months"  CT of the abdomen in February 2020 showed a 1.1 x 0.9 x 0.7 cm right lower lobe pulmonary nodule.  She had a chest CT in April 2021 which showed the nodule increased in size to 1.4 x 1.3 cm. There was no adenopathy. PET/CT showed the lower lobe nodule was hypermetabolic with an SUV of 4. No evidence of regional or distant metastases.  She is s/p right lower lobectomy and node dissection on 06/19/2019. 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 Her postoperative course was complicated by an air leak.  Intrabronchial valves were placed.  She finally went home on  day 12. She did go home on oxygen but was able to discontinue after discharge. Per last followup with Dr. Roxan Hockey 07/15/19, "She is doing well.  She does get short of breath with exertion.  She is no longer on oxygen.  She has some discomfort but is no longer taking any narcotics."  Preop labs reviewed, unremarkable.   EKG 06/17/19: Normal sinus rhythm. Rate 75. Cannot rule out Anterior infarct , age undetermined. No significant change since 2019  CHEST - 2 VIEW 08/05/19: COMPARISON:  07/15/2019, 07/02/2019, 07/01/2019  FINDINGS: Postsurgical changes in the right perihilar lung. Small residual right pleural effusion with fluid along the fissure, decreased since 07/15/2019. Left lung is clear. Small right apical pneumothorax, similar in size as compared with 07/02/2019, possibly obscured on 07/15/2019 by small amount of hazy fluid at the apex. Stable cardiomediastinal silhouette with aortic atherosclerosis.  IMPRESSION: 1. Small right apical pneumothorax with small residual right pleural effusion but with improved aeration at the right lung base since 07/15/2019. Apical pneumothorax is probably not significantly changed in size as compared with 07/02/2019.  PFTs 05/17/19: FVC-%Pred-Pre Latest Units: % 70 FEV1-%Pred-Pre Latest Units: % 64 FEV1FVC-%Pred-Pre Latest Units: % 90 DLCO unc % pred Latest Units: % 77   Karoline Caldwell, PA-C )        Anesthesia Quick Evaluation

## 2019-08-07 ENCOUNTER — Other Ambulatory Visit (HOSPITAL_COMMUNITY): Payer: Medicaid Other

## 2019-08-08 ENCOUNTER — Ambulatory Visit (HOSPITAL_COMMUNITY): Payer: Medicaid Other | Admitting: Certified Registered"

## 2019-08-08 ENCOUNTER — Other Ambulatory Visit: Payer: Self-pay

## 2019-08-08 ENCOUNTER — Encounter (HOSPITAL_COMMUNITY): Payer: Self-pay | Admitting: Thoracic Surgery (Cardiothoracic Vascular Surgery)

## 2019-08-08 ENCOUNTER — Ambulatory Visit (HOSPITAL_COMMUNITY)
Admission: RE | Admit: 2019-08-08 | Discharge: 2019-08-08 | Disposition: A | Discharge status: Home / Self Care | Payer: Medicaid Other | Attending: Thoracic Surgery (Cardiothoracic Vascular Surgery) | Admitting: Thoracic Surgery (Cardiothoracic Vascular Surgery)

## 2019-08-08 ENCOUNTER — Encounter (HOSPITAL_COMMUNITY)
Admission: RE | Disposition: A | Discharge status: Home / Self Care | Payer: Self-pay | Source: Home / Self Care | Attending: Thoracic Surgery (Cardiothoracic Vascular Surgery)

## 2019-08-08 DIAGNOSIS — M170 Bilateral primary osteoarthritis of knee: Secondary | ICD-10-CM | POA: Diagnosis not present

## 2019-08-08 DIAGNOSIS — M19041 Primary osteoarthritis, right hand: Secondary | ICD-10-CM | POA: Insufficient documentation

## 2019-08-08 DIAGNOSIS — J9382 Other air leak: Secondary | ICD-10-CM | POA: Diagnosis not present

## 2019-08-08 DIAGNOSIS — Z902 Acquired absence of lung [part of]: Secondary | ICD-10-CM | POA: Diagnosis not present

## 2019-08-08 DIAGNOSIS — K219 Gastro-esophageal reflux disease without esophagitis: Secondary | ICD-10-CM | POA: Insufficient documentation

## 2019-08-08 DIAGNOSIS — K7460 Unspecified cirrhosis of liver: Secondary | ICD-10-CM | POA: Insufficient documentation

## 2019-08-08 DIAGNOSIS — Z87891 Personal history of nicotine dependence: Secondary | ICD-10-CM | POA: Insufficient documentation

## 2019-08-08 DIAGNOSIS — Z7951 Long term (current) use of inhaled steroids: Secondary | ICD-10-CM | POA: Diagnosis not present

## 2019-08-08 DIAGNOSIS — J449 Chronic obstructive pulmonary disease, unspecified: Secondary | ICD-10-CM | POA: Diagnosis not present

## 2019-08-08 DIAGNOSIS — F1010 Alcohol abuse, uncomplicated: Secondary | ICD-10-CM | POA: Insufficient documentation

## 2019-08-08 DIAGNOSIS — Z4589 Encounter for adjustment and management of other implanted devices: Secondary | ICD-10-CM | POA: Diagnosis not present

## 2019-08-08 DIAGNOSIS — K766 Portal hypertension: Secondary | ICD-10-CM | POA: Diagnosis not present

## 2019-08-08 DIAGNOSIS — F319 Bipolar disorder, unspecified: Secondary | ICD-10-CM | POA: Diagnosis not present

## 2019-08-08 DIAGNOSIS — Z8619 Personal history of other infectious and parasitic diseases: Secondary | ICD-10-CM | POA: Insufficient documentation

## 2019-08-08 DIAGNOSIS — Z79899 Other long term (current) drug therapy: Secondary | ICD-10-CM | POA: Insufficient documentation

## 2019-08-08 DIAGNOSIS — M19042 Primary osteoarthritis, left hand: Secondary | ICD-10-CM | POA: Insufficient documentation

## 2019-08-08 DIAGNOSIS — J95812 Postprocedural air leak: Secondary | ICD-10-CM | POA: Diagnosis not present

## 2019-08-08 HISTORY — PX: VIDEO BRONCHOSCOPY WITH INSERTION OF INTERBRONCHIAL VALVE (IBV): SHX6178

## 2019-08-08 SURGERY — BRONCHOSCOPY, FLEXIBLE, WITH INTRABRONCHIAL VALVE INSERTION
Anesthesia: General

## 2019-08-08 MED ORDER — SUGAMMADEX SODIUM 200 MG/2ML IV SOLN
INTRAVENOUS | Status: DC | PRN
  Administered 2019-08-08: 300 mg via INTRAVENOUS

## 2019-08-08 MED ORDER — ONDANSETRON HCL 4 MG/2ML IJ SOLN
INTRAMUSCULAR | Status: AC
  Filled 2019-08-08: qty 2

## 2019-08-08 MED ORDER — FENTANYL CITRATE (PF) 100 MCG/2ML IJ SOLN: 25.0000 ug | INTRAMUSCULAR | Status: DC | PRN

## 2019-08-08 MED ORDER — PROPOFOL 10 MG/ML IV BOLUS
INTRAVENOUS | Status: AC
  Filled 2019-08-08: qty 20

## 2019-08-08 MED ORDER — 0.9 % SODIUM CHLORIDE (POUR BTL) OPTIME
TOPICAL | Status: DC | PRN
Start: 2019-08-08 — End: 2019-08-08
  Administered 2019-08-08: 1000 mL

## 2019-08-08 MED ORDER — PROPOFOL 500 MG/50ML IV EMUL
INTRAVENOUS | Status: DC | PRN
  Administered 2019-08-08: 50 ug/kg/min via INTRAVENOUS

## 2019-08-08 MED ORDER — ROCURONIUM BROMIDE 10 MG/ML (PF) SYRINGE
PREFILLED_SYRINGE | INTRAVENOUS | Status: DC | PRN
  Administered 2019-08-08: 50 mg via INTRAVENOUS

## 2019-08-08 MED ORDER — IPRATROPIUM-ALBUTEROL 0.5-2.5 (3) MG/3ML IN SOLN
3.0000 mL | Freq: Once | RESPIRATORY_TRACT | Status: AC
  Administered 2019-08-08: 3 mL via RESPIRATORY_TRACT

## 2019-08-08 MED ORDER — IPRATROPIUM-ALBUTEROL 0.5-2.5 (3) MG/3ML IN SOLN
RESPIRATORY_TRACT | Status: AC
  Filled 2019-08-08: qty 3

## 2019-08-08 MED ORDER — MIDAZOLAM HCL 2 MG/2ML IJ SOLN
INTRAMUSCULAR | Status: AC
  Filled 2019-08-08: qty 2

## 2019-08-08 MED ORDER — FENTANYL CITRATE (PF) 100 MCG/2ML IJ SOLN
INTRAMUSCULAR | Status: DC | PRN
  Administered 2019-08-08: 100 ug via INTRAVENOUS

## 2019-08-08 MED ORDER — ORAL CARE MOUTH RINSE: 15.0000 mL | Freq: Once | OROMUCOSAL | Status: AC

## 2019-08-08 MED ORDER — PROMETHAZINE HCL 25 MG/ML IJ SOLN: 6.2500 mg | INTRAMUSCULAR | Status: DC | PRN

## 2019-08-08 MED ORDER — ONDANSETRON HCL 4 MG/2ML IJ SOLN
INTRAMUSCULAR | Status: DC | PRN
  Administered 2019-08-08: 4 mg via INTRAVENOUS

## 2019-08-08 MED ORDER — ALBUTEROL SULFATE HFA 108 (90 BASE) MCG/ACT IN AERS
INHALATION_SPRAY | RESPIRATORY_TRACT | Status: DC | PRN
  Administered 2019-08-08 (×2): 3 via RESPIRATORY_TRACT

## 2019-08-08 MED ORDER — MIDAZOLAM HCL 5 MG/5ML IJ SOLN
INTRAMUSCULAR | Status: DC | PRN
  Administered 2019-08-08: 2 mg via INTRAVENOUS

## 2019-08-08 MED ORDER — MEPERIDINE HCL 25 MG/ML IJ SOLN: 6.2500 mg | INTRAMUSCULAR | Status: DC | PRN

## 2019-08-08 MED ORDER — CHLORHEXIDINE GLUCONATE 0.12 % MT SOLN
15.0000 mL | Freq: Once | OROMUCOSAL | Status: AC
  Administered 2019-08-08: 15 mL via OROMUCOSAL
  Filled 2019-08-08: qty 15

## 2019-08-08 MED ORDER — LIDOCAINE 2% (20 MG/ML) 5 ML SYRINGE
INTRAMUSCULAR | Status: DC | PRN
  Administered 2019-08-08: 60 mg via INTRAVENOUS

## 2019-08-08 MED ORDER — FENTANYL CITRATE (PF) 250 MCG/5ML IJ SOLN
INTRAMUSCULAR | Status: AC
  Filled 2019-08-08: qty 5

## 2019-08-08 MED ORDER — PROPOFOL 10 MG/ML IV BOLUS
INTRAVENOUS | Status: DC | PRN
  Administered 2019-08-08: 110 mg via INTRAVENOUS

## 2019-08-08 MED ORDER — LACTATED RINGERS IV SOLN: INTRAVENOUS | Status: DC

## 2019-08-08 SURGICAL SUPPLY — 32 items
ADAPTER VALVE BIOPSY EBUS (MISCELLANEOUS) IMPLANT
ADPTR VALVE BIOPSY EBUS (MISCELLANEOUS)
BLADE CLIPPER SURG (BLADE) IMPLANT
CANISTER SUCT 3000ML PPV (MISCELLANEOUS) ×2 IMPLANT
CNTNR URN SCR LID CUP LEK RST (MISCELLANEOUS) ×1 IMPLANT
CONT SPEC 4OZ STRL OR WHT (MISCELLANEOUS) ×1
COVER BACK TABLE 60X90IN (DRAPES) ×2 IMPLANT
FILTER STRAW FLUID ASPIR (MISCELLANEOUS) IMPLANT
FORCEPS BIOP RJ4 1.8 (CUTTING FORCEPS) ×2 IMPLANT
GAUZE SPONGE 4X4 12PLY STRL (GAUZE/BANDAGES/DRESSINGS) ×2 IMPLANT
GLOVE SURG SIGNA 7.5 PF LTX (GLOVE) ×2 IMPLANT
GOWN STRL REUS W/ TWL XL LVL3 (GOWN DISPOSABLE) ×1 IMPLANT
GOWN STRL REUS W/TWL XL LVL3 (GOWN DISPOSABLE) ×1
KIT CLEAN ENDO COMPLIANCE (KITS) ×2 IMPLANT
KIT TURNOVER KIT B (KITS) ×2 IMPLANT
MARKER SKIN DUAL TIP RULER LAB (MISCELLANEOUS) ×2 IMPLANT
NS IRRIG 1000ML POUR BTL (IV SOLUTION) ×2 IMPLANT
OIL SILICONE PENTAX (PARTS (SERVICE/REPAIRS)) IMPLANT
PAD ARMBOARD 7.5X6 YLW CONV (MISCELLANEOUS) ×4 IMPLANT
STOPCOCK MORSE 400PSI 3WAY (MISCELLANEOUS) ×2 IMPLANT
SYR 10ML LL (SYRINGE) ×2 IMPLANT
SYR 20ML ECCENTRIC (SYRINGE) ×2 IMPLANT
TOWEL GREEN STERILE (TOWEL DISPOSABLE) ×2 IMPLANT
TOWEL GREEN STERILE FF (TOWEL DISPOSABLE) ×2 IMPLANT
TOWEL NATURAL 4PK STERILE (DISPOSABLE) IMPLANT
TRAP SPECIMEN MUCUS 40CC (MISCELLANEOUS) ×2 IMPLANT
TUBE CONNECTING 20X1/4 (TUBING) ×2 IMPLANT
UNDERPAD 30X36 HEAVY ABSORB (UNDERPADS AND DIAPERS) ×2 IMPLANT
VALVE BIOPSY  SINGLE USE (MISCELLANEOUS) ×1
VALVE BIOPSY SINGLE USE (MISCELLANEOUS) ×1 IMPLANT
VALVE SUCTION BRONCHIO DISP (MISCELLANEOUS) ×2 IMPLANT
WATER STERILE IRR 1000ML POUR (IV SOLUTION) ×2 IMPLANT

## 2019-08-08 NOTE — Op Note (Signed)
NAME: LIRIDONA, MASHAW MEDICAL RECORD [id number] ACCOUNT [id number] DATE OF BIRTH:Jun 16, 1965 FACILITY: [facility] LOCATION: [facility] PHYSICIAN:Bijon Mineer Chaya Jan, MD  OPERATIVE REPORT  DATE OF PROCEDURE:  08/08/2019  PREOPERATIVE DIAGNOSIS:  Intrabronchial valves placed for persistent air leak.  POSTOPERATIVE DIAGNOSIS:  Intrabronchial valves placed for persistent air leak.  PROCEDURE:  Bronchoscopy for removal of intrabronchial valves.  SURGEON:  Modesto Charon, MD  ASSISTANT:  None.  ANESTHESIA:  General.  FINDINGS:  Valves removed without difficulty.  Right lower lobe bronchial stump well healed.  CLINICAL NOTE:  The patient is a 54 year old woman who had undergone a robotic-assisted right lower lobectomy, complicated by a persistent air leak.  She had placement of intrabronchial valves on 06/26/2019.  Her air leak resolved.  She now returns for removal  of the valves.  The indications, risks, benefits, and alternatives were discussed in detail with the patient.  She accepted the risks and agreed to proceed.  DESCRIPTION OF PROCEDURE:  Ms. Rhodes was brought to the operating room on 08/08/2019.  She had induction of general anesthesia and was intubated.  A timeout was performed.  Flexible fiberoptic bronchoscopy was performed via the endotracheal tube.  The  right lower lobe bronchial stump was well healed.  The middle lobe bronchus was normal.  The upper lobe bronchus was inspected and 1 valve was visible initially.  The stem of this valve was grasped and removed without difficulty.  There was minor  bleeding with the valve removal.  The blood was irrigated with saline and the second valve was now visible.  The stem of it was grasped and was removed without difficulty.  The bronchoscope was reinserted.  Saline irrigation was performed until there was  no further oozing.  The bronchoscope then was removed.  The patient was extubated in the operating room and taken to the  [facility] in good condition.  VN/NUANCE  D:08/08/2019 T:08/08/2019 JOB:[id number]

## 2019-08-08 NOTE — Discharge Instructions (Addendum)
Do not drive or engage in heavy physical activity for 24 hours  You may resume normal activities tomorrow  My office will contact you to schedule a follow up appointment  Call [phone] if you develop chest pain or shortness of breath

## 2019-08-08 NOTE — Anesthesia Procedure Notes (Signed)
Procedure Name: Intubation Date/Time: 08/08/2019 12:55 PM Performed by: Imagene Riches, CRNA Pre-anesthesia Checklist: Patient identified, Emergency Drugs available, Suction available and Patient being monitored Patient Re-evaluated:Patient Re-evaluated prior to induction Oxygen Delivery Method: Circle System Utilized Preoxygenation: Pre-oxygenation with 100% oxygen Induction Type: IV induction Ventilation: Mask ventilation without difficulty Laryngoscope Size: Mac and 3 Grade View: Grade I Tube type: Oral Tube size: 8.5 mm Number of attempts: 1 Airway Equipment and Method: Stylet and Oral airway Placement Confirmation: ETT inserted through vocal cords under direct vision,  positive ETCO2 and breath sounds checked- equal and bilateral Secured at: 21 cm Tube secured with: Tape Dental Injury: Teeth and Oropharynx as per pre-operative assessment

## 2019-08-08 NOTE — Brief Op Note (Signed)
08/08/2019  1:32 PM  PATIENT:  Rachel Gross  54 y.o. female  PRE-OPERATIVE DIAGNOSIS:  IBV VALVES  POST-OPERATIVE DIAGNOSIS:  IBV VALVES  PROCEDURE:  Procedure(s): VIDEO BRONCHOSCOPY WITH REMOVAL OF INTERBRONCHIAL VALVE (IBV) (N/A)  SURGEON:  Surgeon(s) and Role:    * [name], MD - Primary  PHYSICIAN ASSISTANT:   ASSISTANTS: none   ANESTHESIA:   general  EBL:  1 mL   BLOOD ADMINISTERED:none  DRAINS: none   LOCAL MEDICATIONS USED:  NONE  SPECIMEN:  No Specimen  DISPOSITION OF SPECIMEN:  N/A  COUNTS:  NO endo  TOURNIQUET:  * No tourniquets in log *  DICTATION: .Other Dictation: Dictation Number -  PLAN OF CARE: Discharge to home after PACU  PATIENT DISPOSITION:  PACU - hemodynamically stable.   Delay start of Pharmacological VTE agent (>24hrs) due to surgical blood loss or risk of bleeding: not applicable

## 2019-08-08 NOTE — Anesthesia Preprocedure Evaluation (Addendum)
Anesthesia Evaluation  Patient identified by MRN, date of birth, ID band Patient awake    Reviewed: Allergy & Precautions, NPO status , Patient's Chart, lab work & pertinent test results  Airway Mallampati: II  TM Distance: >3 FB Neck ROM: Full    Dental  (+) Partial Upper, Missing, Dental Advisory Given,    Pulmonary shortness of breath, pneumonia, COPD, Patient abstained from smoking., former smoker,    Pulmonary exam normal breath sounds clear to auscultation       Cardiovascular hypertension, Normal cardiovascular exam Rhythm:Regular Rate:Normal     Neuro/Psych PSYCHIATRIC DISORDERS Anxiety Depression Bipolar Disorder negative neurological ROS     GI/Hepatic GERD  ,(+)     substance abuse  alcohol use, Hepatitis -, C  Endo/Other  negative endocrine ROS  Renal/GU negative Renal ROS     Musculoskeletal  (+) Arthritis ,   Abdominal   Peds  Hematology negative hematology ROS (+)   Anesthesia Other Findings   Reproductive/Obstetrics negative OB ROS                            Anesthesia Physical  Anesthesia Plan  ASA: III  Anesthesia Plan: General   Post-op Pain Management:    Induction: Intravenous  PONV Risk Score and Plan: 3 and Ondansetron, Dexamethasone, Treatment may vary due to age or medical condition and Midazolam  Airway Management Planned: Oral ETT  Additional Equipment: None  Intra-op Plan:   Post-operative Plan: Extubation in OR  Informed Consent: I have reviewed the patients History and Physical, chart, labs and discussed the procedure including the risks, benefits and alternatives for the proposed anesthesia with the patient or authorized representative who has indicated his/her understanding and acceptance.     Dental advisory given  Plan Discussed with: CRNA  Anesthesia Plan Comments: (PAT note by Karoline Caldwell, PA-C: Hx of hepatic cirrhosis, hep C s/p  treatment, portal hypertensive gastropathy, thrombocytopenia followed by GI. 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 Historically her liver disease has been well compensated with a meld of 8 most recently. At this point she is due for updated liver labs. Right upper quadrant ultrasound was completed about 2 months ago and obvious liver lesion. EGD for variceal screening up-to-date. No overt hepatic or GI symptoms today. Update labs, follow-up in 6 months"  CT of the abdomen in February 2020 showed a 1.1 x 0.9 x 0.7 cm right lower lobe pulmonary nodule.  She had a chest CT in April 2021 which showed the nodule increased in size to 1.4 x 1.3 cm. There was no adenopathy. PET/CT showed the lower lobe nodule was hypermetabolic with an SUV of 4. No evidence of regional or distant metastases.  She is s/p right lower lobectomy and node dissection on 06/19/2019. 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 Her postoperative course was complicated by an air leak.  Intrabronchial valves were placed.  She finally went home on day 12. She did go home on oxygen but was able to discontinue after discharge. Per last followup with Dr. Roxan Hockey 07/15/19, "She is doing well.  She does get short of breath with exertion.  She is no longer on oxygen.  She has some discomfort but is no longer taking any narcotics."  Preop labs reviewed, unremarkable.   EKG 06/17/19: Normal sinus rhythm. Rate 75. Cannot rule out Anterior infarct , age undetermined.  No significant change since 2019  CHEST - 2 VIEW 08/05/19: COMPARISON:  07/15/2019, 07/02/2019, 07/01/2019  FINDINGS: Postsurgical changes in the right perihilar lung. Small residual right pleural effusion with fluid along the fissure, decreased since 07/15/2019. Left lung is clear. Small right apical pneumothorax, similar in size as compared with 07/02/2019, possibly obscured  on 07/15/2019 by small amount of hazy fluid at the apex. Stable cardiomediastinal silhouette with aortic atherosclerosis.  IMPRESSION: 1. Small right apical pneumothorax with small residual right pleural effusion but with improved aeration at the right lung base since 07/15/2019. Apical pneumothorax is probably not significantly changed in size as compared with 07/02/2019.  PFTs 05/17/19: FVC-%Pred-Pre Latest Units: % 70 FEV1-%Pred-Pre Latest Units: % 64 FEV1FVC-%Pred-Pre Latest Units: % 90 DLCO unc % pred Latest Units: % 77   Karoline Caldwell, PA-C )      Anesthesia Quick Evaluation

## 2019-08-08 NOTE — Interval H&P Note (Signed)
History and Physical Interval Note:  08/08/2019 12:25 PM  Rachel Gross  has presented today for surgery, with the diagnosis of IBV VALVES.  The various methods of treatment have been discussed with the patient and family. After consideration of risks, benefits and other options for treatment, the patient has consented to  Procedure(s): VIDEO BRONCHOSCOPY WITH REMOVAL OF INTERBRONCHIAL VALVE (IBV) (N/A) as a surgical intervention.  The patient's history has been reviewed, patient examined, no change in status, stable for surgery.  I have reviewed the patient's chart and labs.  Questions were answered to the patient's satisfaction.     [name]

## 2019-08-08 NOTE — Transfer of Care (Signed)
Immediate Anesthesia Transfer of Care Note  Patient: Rachel Gross  Procedure(s) Performed: VIDEO BRONCHOSCOPY WITH REMOVAL OF INTERBRONCHIAL VALVE (IBV) (N/A )  Patient Location: PACU  Anesthesia Type:General  Level of Consciousness: drowsy  Airway & Oxygen Therapy: Patient Spontanous Breathing and Patient connected to nasal cannula oxygen  Post-op Assessment: Report given to RN and Post -op Vital signs reviewed and stable  Post vital signs: Reviewed and stable  Last Vitals:  Vitals Value Taken Time  BP    Temp    Pulse 99 08/08/19 1323  Resp 25 08/08/19 1323  SpO2 95 % 08/08/19 1323  Vitals shown include unvalidated device data.  Last Pain:  Vitals:   08/08/19 0952  TempSrc:   PainSc: 0-No pain      Patients Stated Pain Goal: 2 (46/80/32 1224)  Complications: No complications documented.

## 2019-08-09 ENCOUNTER — Encounter (HOSPITAL_COMMUNITY): Payer: Self-pay | Admitting: Thoracic Surgery (Cardiothoracic Vascular Surgery)

## 2019-08-09 NOTE — Anesthesia Postprocedure Evaluation (Signed)
Anesthesia Post Note  Patient: Medical laboratory scientific officer  Procedure(s) Performed: VIDEO BRONCHOSCOPY WITH REMOVAL OF INTERBRONCHIAL VALVE (IBV) (N/A )     Patient location during evaluation: PACU Anesthesia Type: General Level of consciousness: awake and alert Pain management: pain level controlled Vital Signs Assessment: post-procedure vital signs reviewed and stable Respiratory status: spontaneous breathing, nonlabored ventilation, respiratory function stable and patient connected to nasal cannula oxygen Cardiovascular status: blood pressure returned to baseline and stable Postop Assessment: no apparent nausea or vomiting Anesthetic complications: no   No complications documented.  Last Vitals:  Vitals:   08/08/19 1355 08/08/19 1410  BP: 116/60 120/72  Pulse: 88 87  Resp: 20 18  Temp:  36.7 C  SpO2: 99% 94%    Last Pain:  Vitals:   08/08/19 1355  TempSrc:   PainSc: 0-No pain                 Kacy Hegna S

## 2019-08-12 ENCOUNTER — Other Ambulatory Visit (HOSPITAL_COMMUNITY): Payer: Self-pay | Admitting: *Deleted

## 2019-08-12 MED ORDER — FOLIC ACID 1 MG PO TABS: 1.0000 mg | ORAL_TABLET | Freq: Every day | ORAL | 1 refills | Status: DC

## 2019-08-12 NOTE — Telephone Encounter (Signed)
Per Dr. Delton Coombes, patient may be allergic to the components of the particular brand of Folic Acid that she got from the pharmacy. He wants her to change brands and see if she can tolerate it.  I have called it in to a different pharmacy as [facility] only carries the one [employer].  I have added the sunrise brand to her allergies both in her chart and at the pharmacy.    Patient is aware and will pick up the medication on Thursday.

## 2019-08-14 ENCOUNTER — Other Ambulatory Visit: Payer: Self-pay

## 2019-08-14 ENCOUNTER — Inpatient Hospital Stay (HOSPITAL_COMMUNITY): Payer: Medicaid Other | Attending: Hematology | Admitting: Hematology

## 2019-08-14 ENCOUNTER — Inpatient Hospital Stay (HOSPITAL_COMMUNITY): Payer: Medicaid Other

## 2019-08-14 VITALS — BP 117/78 | HR 99 | Temp 97.5°F | Resp 20 | Wt 165.3 lb

## 2019-08-14 DIAGNOSIS — Z8249 Family history of ischemic heart disease and other diseases of the circulatory system: Secondary | ICD-10-CM | POA: Diagnosis not present

## 2019-08-14 DIAGNOSIS — K7460 Unspecified cirrhosis of liver: Secondary | ICD-10-CM | POA: Diagnosis not present

## 2019-08-14 DIAGNOSIS — R0602 Shortness of breath: Secondary | ICD-10-CM | POA: Diagnosis not present

## 2019-08-14 DIAGNOSIS — C3491 Malignant neoplasm of unspecified part of right bronchus or lung: Secondary | ICD-10-CM | POA: Diagnosis not present

## 2019-08-14 DIAGNOSIS — Z818 Family history of other mental and behavioral disorders: Secondary | ICD-10-CM | POA: Insufficient documentation

## 2019-08-14 DIAGNOSIS — Z87891 Personal history of nicotine dependence: Secondary | ICD-10-CM | POA: Diagnosis not present

## 2019-08-14 DIAGNOSIS — D693 Immune thrombocytopenic purpura: Secondary | ICD-10-CM | POA: Insufficient documentation

## 2019-08-14 DIAGNOSIS — Z79899 Other long term (current) drug therapy: Secondary | ICD-10-CM | POA: Insufficient documentation

## 2019-08-14 DIAGNOSIS — Z8042 Family history of malignant neoplasm of prostate: Secondary | ICD-10-CM | POA: Diagnosis not present

## 2019-08-14 DIAGNOSIS — C3431 Malignant neoplasm of lower lobe, right bronchus or lung: Secondary | ICD-10-CM | POA: Diagnosis present

## 2019-08-14 DIAGNOSIS — R5383 Other fatigue: Secondary | ICD-10-CM | POA: Diagnosis not present

## 2019-08-14 MED ORDER — CYANOCOBALAMIN 1000 MCG/ML IJ SOLN
INTRAMUSCULAR | Status: AC
  Filled 2019-08-14: qty 1

## 2019-08-14 MED ORDER — CYANOCOBALAMIN 1000 MCG/ML IJ SOLN
1000.0000 ug | Freq: Once | INTRAMUSCULAR | Status: AC
  Administered 2019-08-14: 1000 ug via INTRAMUSCULAR

## 2019-08-14 NOTE — Progress Notes (Signed)
Discharged from clinic ambulatory by ATravis LPN/ Y23 given. F/U with [facility] as scheduled.

## 2019-08-14 NOTE — Patient Instructions (Signed)
[place] at [facility] Discharge Instructions  You were seen today by Dr. Delton Coombes. He went over your recent results. You received your vitamin B12 injection today. Take another brand of the folic acid over the counter, and take 2 tablets every day. Dr. Delton Coombes will see you back in 1 week for labs and follow up.   Thank you for choosing [place] at [facility] to provide your oncology and hematology care.  To afford each patient quality time with our provider, please arrive at least 15 minutes before your scheduled appointment time.   If you have a lab appointment with the [facility] please come in thru the Main Entrance and check in at the main information desk  You need to re-schedule your appointment should you arrive 10 or more minutes late.  We strive to give you quality time with our providers, and arriving late affects you and other patients whose appointments are after yours.  Also, if you no show three or more times for appointments you may be dismissed from the clinic at the providers discretion.     Again, thank you for choosing [facility].  Our hope is that these requests will decrease the amount of time that you wait before being seen by our physicians.       _____________________________________________________________  Should you have questions after your visit to [facility], please contact our office at [phone] between the hours of 8:00 a.m. and 4:30 p.m.  Voicemails left after 4:00 p.m. will not be returned until the following business day.  For prescription refill requests, have your pharmacy contact our office and allow 72 hours.    Cancer Center Support Programs:   > Cancer Support Group  2nd Tuesday of the month 1pm-2pm, Journey Room

## 2019-08-14 NOTE — Progress Notes (Signed)
Pt given B12 injection in let deltoid with no complaints. Pt stable and discharged home ambulatory. To return as scheduled.

## 2019-08-14 NOTE — Progress Notes (Signed)
Rachel Gross, [place] [address]   CLINIC:  Medical Oncology/Hematology  PCP:  Celene Squibb, MD [address] Rachel Gross [place] [address] [phone]   REASON FOR VISIT:  Follow-up for stage III right lung cancer  PRIOR THERAPY: Right lower lobectomy on 06/19/2019  NGS Results: Pending  CURRENT THERAPY: Observation  BRIEF ONCOLOGIC HISTORY:  Oncology History  Adenocarcinoma of lung, stage 3, right ([facility])  07/02/2019 Initial Diagnosis   Adenocarcinoma of lung, stage 3, right ([facility])   07/18/2019 Genetic Testing   Foundation One     07/23/2019 Genetic Testing   PD-L1     08/12/2019 -  Chemotherapy   The patient had palonosetron (ALOXI) injection 0.25 mg, 0.25 mg, Intravenous,  Once, 0 of 4 cycles PEMEtrexed (ALIMTA) 900 mg in sodium chloride 0.9 % 100 mL chemo infusion, 500 mg/m2, Intravenous,  Once, 0 of 4 cycles CARBOplatin (PARAPLATIN) in sodium chloride 0.9 % 100 mL chemo infusion, , Intravenous,  Once, 0 of 4 cycles fosaprepitant (EMEND) 150 mg in sodium chloride 0.9 % 145 mL IVPB, 150 mg, Intravenous,  Once, 0 of 4 cycles  for chemotherapy treatment.      CANCER STAGING: Cancer Staging No matching staging information was found for the patient.  INTERVAL HISTORY:  [name], a 54 y.o. female, returns for routine follow-up of her stage III right lung cancer. Rachel Gross was last seen on 05/15/2019.  Today she reports that her port is scheduled to be placed on 08/18/2019 by Dr. Arnoldo Morale. Her appetite is okay, and she denies nausea. She denies headaches or vision changes, no hematuria or hematochezia. She starts wheezing when walking a long distance. She started taking folic acid, but after a couple days, her throat swelled and she stopped taking it.   REVIEW OF SYSTEMS:  Review of Systems  Constitutional: Positive for appetite change (moderately decreased) and fatigue (mild).  Eyes: Negative for eye problems.  Respiratory:  Positive for shortness of breath.   Gastrointestinal: Positive for constipation. Negative for blood in stool.  Genitourinary: Negative for hematuria.   Neurological: Negative for headaches.  All other systems reviewed and are negative.   PAST MEDICAL/SURGICAL HISTORY:  Past Medical History:  Diagnosis Date  . Alcoholic hepatitis without ascites   . Anxiety   . Arthritis    knees, hands  . 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 Bipolar disorder ([facility])   . Cancer ([facility])    right lung  . COPD (chronic obstructive pulmonary disease) ([facility])   . Depression   . Dyspnea   . Emphysema lung ([facility])   . GERD (gastroesophageal reflux disease)   . Hepatitis C    s/p treatment with Epclusa  . History of HPV infection   . History of pneumonia   . Pneumonia   . Smoker    Past Surgical History:  Procedure Laterality Date  . BIOPSY  05/02/2018   Procedure: BIOPSY;  Surgeon: Daneil Dolin, MD;  [facility];  Service: Endoscopy;;  gastric  . CESAREAN SECTION    . CHEST TUBE INSERTION Right 06/26/2019   Procedure: Right CHEST TUBE REPLACEMENT;  Surgeon: [name], MD;  Location: [facility];  Service: Thoracic;  Laterality: Right;  . COLONOSCOPY WITH PROPOFOL N/A 03/03/2019   Procedure: COLONOSCOPY WITH PROPOFOL;  Surgeon: Daneil Dolin, MD;  [facility];  Service: Endoscopy;  Laterality: N/A;  9:15am  . ESOPHAGOGASTRODUODENOSCOPY (  EGD) WITH PROPOFOL N/A 05/02/2018   Procedure: ESOPHAGOGASTRODUODENOSCOPY (EGD) WITH PROPOFOL;  Surgeon: Daneil Dolin, MD;  [facility];  Service: Endoscopy;  Laterality: N/A;  8:30am  . EYE SURGERY Bilateral    cataract  . HEMOSTASIS CLIP PLACEMENT  03/03/2019   Procedure: HEMOSTASIS CLIP PLACEMENT;  Surgeon: Daneil Dolin, MD;  [facility];  Service: Endoscopy;;  . INTERCOSTAL NERVE BLOCK Right 06/19/2019   Procedure: Intercostal Nerve Block;  Surgeon: [name], MD;   Location: [facility];  Service: Thoracic;  Laterality: Right;  . IR US GUIDE VASC ACCESS RIGHT  03/13/2018  . LOBECTOMY    . LYMPH NODE DISSECTION Right 06/19/2019   Procedure: Lymph Node Dissection;  Surgeon: [name], MD;  Location: [facility];  Service: Thoracic;  Laterality: Right;  . POLYPECTOMY  03/03/2019   Procedure: POLYPECTOMY;  Surgeon: Daneil Dolin, MD;  [facility];  Service: Endoscopy;;  . TONSILLECTOMY    . TOOTH EXTRACTION  01/31/2018   x 7  . VIDEO BRONCHOSCOPY WITH INSERTION OF INTERBRONCHIAL VALVE (IBV) N/A 06/26/2019   Procedure: VIDEO BRONCHOSCOPY WITH INSERTION OF TWO INTERBRONCHIAL VALVE (IBV);  Surgeon: [name], MD;  Location[facility];  Service: Thoracic;  Laterality: N/A;  . VIDEO BRONCHOSCOPY WITH INSERTION OF INTERBRONCHIAL VALVE (IBV) N/A 08/08/2019   Procedure: VIDEO BRONCHOSCOPY WITH REMOVAL OF INTERBRONCHIAL VALVE (IBV);  Surgeon: [name], MD;  Location: [facility] OR;  Service: Thoracic;  Laterality: N/A;    SOCIAL HISTORY:  Social History   Socioeconomic History  . Marital status: Single    Spouse name: Not on file  . Number of children: Not on file  . Years of education: Not on file  . Highest education level: Not on file  Occupational History  . Not on file  Tobacco Use  . Smoking status: Former Smoker    Packs/day: 0.50    Years: 38.00    Pack years: 19.00    Types: Cigarettes    Quit date: 06/19/2019    Years since quitting: 0.1  . Smokeless tobacco: Never Used  Vaping Use  . Vaping Use: Never used  Substance and Sexual Activity  . Alcohol use: Not Currently    Comment: Last use of alcohol 03/2016  . 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 Sexual activity: Not on file  Other Topics Concern  . Not on file  Social History Narrative  . Not on file   Social Determinants of Health   Financial Resource Strain:   . Difficulty of Paying Living Expenses:   Food Insecurity:   . Worried  About Charity fundraiser in the Last Year:   . Arboriculturist in the Last Year:   Transportation Needs:   . Film/video editor (Medical):   Marland Kitchen Lack of Transportation (Non-Medical):   Physical Activity:   . Days of Exercise per Week:   . Minutes of Exercise per Session:   Stress:   . Feeling of Stress :   Social Connections:   . Frequency of Communication with Friends and Family:   . Frequency of Social Gatherings with Friends and Family:   . Attends Religious Services:   . Active Member of Clubs or Organizations:   . Attends Archivist Meetings:   Marland Kitchen Marital Status:   Intimate Partner Violence:   . Fear of Current or Ex-Partner:   .  Emotionally Abused:   Marland Kitchen Physically Abused:   . Sexually Abused:     FAMILY HISTORY:  Family History  Problem Relation Age of Onset  . Congenital heart disease Mother   . Bipolar disorder Mother   . Prostate cancer Brother   . Alzheimer's disease Paternal Grandmother   . Colon cancer Neg Hx     CURRENT MEDICATIONS:  Current Outpatient Medications  Medication Sig Dispense Refill  . diphenhydrAMINE (BENADRYL) 25 MG tablet Take 25 mg by mouth at bedtime.     . divalproex (DEPAKOTE) 250 MG DR tablet Take 250 mg by mouth at bedtime.     . pantoprazole (PROTONIX) 40 MG tablet Take one tablet before breakfast daily. (Patient taking differently: Take 40 mg by mouth daily before breakfast. ) 90 tablet 3  . risperiDONE (RISPERDAL) 2 MG tablet Take 2 mg by mouth at bedtime.     . SYMBICORT 160-4.5 MCG/ACT inhaler Inhale 2 puffs into the lungs 2 (two) times daily.    Marland Kitchen albuterol (VENTOLIN HFA) 108 (90 Base) MCG/ACT inhaler Inhale 2 puffs into the lungs every 4 (four) hours as needed for wheezing or shortness of breath.  (Patient not taking: Reported on 08/14/2019)    . traMADol (ULTRAM) 50 MG tablet Take 1 tablet (50 mg total) by mouth every 6 (six) hours as needed. (Patient not taking: Reported on 08/14/2019) 28 tablet 0   No current  facility-administered medications for this visit.    ALLERGIES:  Allergies  Allergen Reactions  . Folic Acid Anaphylaxis  . Penicillins Anaphylaxis    Throat swelled Did it involve swelling of the face/tongue/throat, SOB, or low BP? Yes Did it involve sudden or severe rash/hives, skin peeling, or any reaction on the inside of your mouth or nose? No Did you need to seek medical attention at a hospital or doctor's office? No When did it last happen?childhood allergy If all above answers are "NO", may proceed with cephalosporin use.   Marland Kitchen Amoxicillin     hallucinations Did it involve swelling of the face/tongue/throat, SOB, or low BP? No Did it involve sudden or severe rash/hives, skin peeling, or any reaction on the inside of your mouth or nose? No Did you need to seek medical attention at a hospital or doctor's office? Yes When did it last happen?July 2019 If all above answers are "NO", may proceed with cephalosporin use.   . Fish Allergy Nausea Only  . Other Swelling    Patient states that the sunrise brand folic acid made her feel like her throat was swelling.     PHYSICAL EXAM:  Performance status (ECOG): 1 - Symptomatic but completely ambulatory  Vitals:   08/14/19 1405  BP: 117/78  Pulse: 99  Resp: 20  Temp: (!) 97.5 F (36.4 C)  SpO2: 95%   Wt Readings from Last 3 Encounters:  08/14/19 165 lb 4.8 oz (75 kg)  08/08/19 165 lb 11.2 oz (75.2 kg)  08/05/19 165 lb 11.2 oz (75.2 kg)   Physical Exam Vitals reviewed.  Constitutional:      Appearance: Normal appearance.  Cardiovascular:     Rate and Rhythm: Normal rate and regular rhythm.     Pulses: Normal pulses.     Heart sounds: Normal heart sounds.  Pulmonary:     Effort: Pulmonary effort is normal.     Breath sounds: Normal breath sounds.  Abdominal:     Palpations: Abdomen is soft.     Tenderness: There is no abdominal tenderness.  Musculoskeletal:  Right lower leg: No edema.     Left lower  leg: No edema.  Neurological:     General: No focal deficit present.     Mental Status: She is alert and oriented to person, place, and time.  Psychiatric:        Mood and Affect: Mood normal.        Behavior: Behavior normal.      LABORATORY DATA:  I have reviewed the labs as listed.  CBC Latest Ref Rng & Units 08/05/2019 06/21/2019 06/20/2019  WBC 4.0 - 10.5 K/uL 7.4 13.1(H) 14.6(H)  Hemoglobin 12.0 - 15.0 g/dL 13.4 10.2(L) 11.5(L)  Hematocrit 36 - 46 % 43.4 30.9(L) 35.7(L)  Platelets 150 - 400 K/uL 185 114(L) 147(L)   CMP Latest Ref Rng & Units 08/05/2019 06/21/2019 06/20/2019  Glucose 70 - 99 mg/dL 90 150(H) 102(H)  BUN 6 - 20 mg/dL <5(L) 14 9  Creatinine 0.44 - 1.00 mg/dL 0.62 0.76 0.72  Sodium 135 - 145 mmol/L 136 131(L) 135  Potassium 3.5 - 5.1 mmol/L 3.6 4.6 4.6  Chloride 98 - 111 mmol/L 99 95(L) 102  CO2 22 - 32 mmol/L 25 25 25   Calcium 8.9 - 10.3 mg/dL 9.8 8.5(L) 8.0(L)  Total Protein 6.5 - 8.1 g/dL 7.8 6.2(L) -  Total Bilirubin 0.3 - 1.2 mg/dL 0.6 1.0 -  Alkaline Phos 38 - 126 U/L 69 46 -  AST 15 - 41 U/L 25 27 -  ALT 0 - 44 U/L 16 18 -    DIAGNOSTIC IMAGING:  I have independently reviewed the scans and discussed with the patient. DG Chest 2 View  Result Date: 08/05/2019 CLINICAL DATA:  Preadmit lung surgery air-leak short of breath EXAM: CHEST - 2 VIEW COMPARISON:  07/15/2019, 07/02/2019, 07/01/2019 FINDINGS: Postsurgical changes in the right perihilar lung. Small residual right pleural effusion with fluid along the fissure, decreased since 07/15/2019. Left lung is clear. Small right apical pneumothorax, similar in size as compared with 07/02/2019, possibly obscured on 07/15/2019 by small amount of hazy fluid at the apex. Stable cardiomediastinal silhouette with aortic atherosclerosis. IMPRESSION: 1. Small right apical pneumothorax with small residual right pleural effusion but with improved aeration at the right lung base since 07/15/2019. Apical pneumothorax is probably not  significantly changed in size as compared with 07/02/2019. These results will be called to the ordering clinician or representative by the Radiologist Assistant, and communication documented in the PACS or [employer]. Electronically Signed   By: Donavan Foil M.D.   On: 08/05/2019 15:32     ASSESSMENT:  1.  Stage IIIa (PT1CPN2) right lung adenocarcinoma: -PET scan on 05/13/2019 showed right lower lobe nodule concerning for bronchogenic carcinoma.  Cirrhotic liver. -Right lower lobectomy and lymph node excision on 06/19/2019. -Pathology showed 1.7 cm right lower lobe adenocarcinoma, unifocal, lymphovascular invasion present.  Margins negative.  2/14 lymph nodes positive (level 7 and 10R).  PT1CPN2.   PLAN:  1.  Stage III right lung adenocarcinoma: -I have recommended 4 cycles of carboplatin and pemetrexed based on high risk of recurrence. -We talked about the side effects in detail -We will give her vitamin B12 injection today.  I will start her on folic acid 1 tablet daily to be taken. -We will request a port placement as she has poor venous access. -We will start her on chemotherapy after port.  2. Immune mediated thrombocytopenia: -She was treated for hepatitis C and was cured.  She has some cirrhosis. -Her platelet count on 08/05/2019 was normal  at 185.   Orders placed this encounter:  No orders of the defined types were placed in this encounter.    Derek Jack, MD [place] [phone]   I, Milinda Antis, am acting as a scribe for Dr. Sanda Linger.  I, Derek Jack MD, have reviewed the above documentation for accuracy and completeness, and I agree with the above.

## 2019-08-15 ENCOUNTER — Encounter (HOSPITAL_COMMUNITY)
Admission: RE | Admit: 2019-08-15 | Discharge: 2019-08-15 | Disposition: A | Discharge status: Home / Self Care | Payer: Medicaid Other | Source: Ambulatory Visit | Attending: General Surgery | Admitting: General Surgery

## 2019-08-15 ENCOUNTER — Other Ambulatory Visit (HOSPITAL_COMMUNITY): Payer: Medicaid Other

## 2019-08-15 ENCOUNTER — Telehealth (HOSPITAL_COMMUNITY): Payer: Self-pay | Admitting: Lab

## 2019-08-15 MED ORDER — OCTREOTIDE ACETATE 30 MG IM KIT
PACK | INTRAMUSCULAR | Status: AC
  Filled 2019-08-15: qty 2

## 2019-08-15 NOTE — Telephone Encounter (Signed)
Patient left voicemail today stating she was not coming for pre op, was not having her chemo port placed and had decided not to have chemo TX.  Unable to reach patient on callback.  Spoke with Dr. Cam Hai office and they reported patient had contact their office as well regarding same.  Appointments canceled.  Message routed to Dr. Delton Coombes.

## 2019-08-18 ENCOUNTER — Encounter (HOSPITAL_COMMUNITY): Admission: RE | Payer: Self-pay | Source: Home / Self Care

## 2019-08-18 ENCOUNTER — Ambulatory Visit (HOSPITAL_COMMUNITY): Admission: RE | Admit: 2019-08-18 | Payer: Medicaid Other | Source: Home / Self Care | Admitting: General Surgery

## 2019-08-18 ENCOUNTER — Encounter (HOSPITAL_COMMUNITY): Payer: Self-pay | Admitting: *Deleted

## 2019-08-18 SURGERY — INSERTION, TUNNELED CENTRAL VENOUS DEVICE, WITH PORT
Anesthesia: Monitor Anesthesia Care | Laterality: Left

## 2019-08-18 NOTE — Progress Notes (Signed)
I was notified that patient called last week and left voicemail stating she was not coming for pre op, was not having her chemo port placed and had decided not to have chemo TX. Our office spoke with Dr. Cam Hai office and they reported patient had contact their office as well regarding same. I was unable to contact patient to see if we could assist with anything, unable to leave a message.

## 2019-08-19 NOTE — Patient Instructions (Addendum)
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  You will be treated in the clinic every 3 weeks x 4 cycles with a combination of chemotherapy drugs.  Those drugs are carboplatin and pemetrexed (Alimta).  The intent of treatment is to cure your disease.  You will see the doctor regularly throughout treatment.  We will obtain blood work from you prior to every treatment and monitor your results to make sure it is safe to give your treatment. The doctor monitors your response to treatment by the way you are feeling, your blood work, and by obtaining scans periodically.  There will be wait times while you are here for treatment.  It will take about 30 minutes to 1 hour for your lab work to result.  Then there will be wait times while pharmacy mixes your medications.   *You must take folic acid every day by mouth beginning 7 days before your first dose of alimta.  You must keep taking folic acid every day during the time you are being treated with alimta, and for every day for 21 days after you receive your last alimta dose.    *You will get B12 injections while you are on alimta.  The first one about 1 week prior to starting treatment and then about every 9 weeks during treatment.   Medications you will receive in the clinic prior to your chemotherapy medications:  Aloxi:  ALOXI is used in adults to help prevent nausea and vomiting that happens with certain chemotherapy drugs.  Aloxi is a long acting medication, and will remain in your system for about two days.   Emend:  This is an anti-nausea medication that is used with Aloxi to help prevent nausea and vomiting caused by chemotherapy.  Dexamethasone:  This is a steroid given prior to chemotherapy to help prevent allergic reactions; it may also help prevent and control nausea and diarrhea.    Carboplatin (Generic Name) Other Names: Paraplatin, CBDCA  About This  Drug Carboplatin is a drug used to treat cancer. This drug is given in the vein (IV).  Takes 30 minutes to infuse.   Possible Side Effects (More Common) . Nausea and throwing up (vomiting). These symptoms may happen within a few hours after your treatment and may last up to 24 hours. Medicines are available to stop or lessen these side effects.  . Bone marrow depression. This is a decrease in the number of white blood cells, red blood cells, and platelets. This may raise your risk of infection, make you tired and weak (fatigue), and raise your risk of bleeding.  . Soreness of the mouth and throat. You may have red areas, white patches, or sores that hurt.  . This drug may affect how your kidneys work. Your kidney function will be checked as needed.  . Electrolyte changes. Your blood will be checked for electrolyte changes as needed.  Possible Side Effects (Less Common)  . Hair loss. Some patients lose their hair on the scalp and body. You may notice your hair thinning seven to 14 days after getting this drug.  . Effects on the nerves are called peripheral neuropathy. You may feel numbness, tingling, or pain in your hands and feet. It may be hard for you to button your clothes, open jars, or walk as usual. The effect on the nerves may get worse with more doses of the drug. These effects get better in some people after the  drug is stopped but it does not get better in all people.  . Loose bowel movements (diarrhea) that may last for several days  . Decreased hearing or ringing in the ears  . Changes in the way food and drinks taste  . Changes in liver function. Your liver function will be checked as needed  Allergic Reactions Serious allergic reactions including anaphylaxis are rare. While you are getting this drug in your vein (IV), tell your nurse right away if you have any of these symptoms of an allergic reaction:  . Trouble catching your breath  . Feeling like your tongue or  throat are swelling  . Feeling your heart beat quickly or in a not normal way (palpitations)  . Feeling dizzy or lightheaded  . Flushing, itching, rash, and/or hives  Treating Side Effects  . Drink 6-8 cups of fluids each day unless your doctor has told you to limit your fluid intake due to some other health problem. A cup is 8 ounces of fluid. If you throw up or have loose bowel movements, you should drink more fluids so that you do not become dehydrated (lack water in the body from losing too much fluid).  . Mouth care is very important. Your mouth care should consist of routine, gentle cleaning of your teeth or dentures and rinsing your mouth with a mixture of 1/2 teaspoon of salt in 8 ounces of water or  teaspoon of baking soda in 8 ounces of water. This should be done at least after each meal and at bedtime.  . If you have mouth sores, avoid mouthwash that has alcohol. Avoid alcohol and smoking because they can bother your mouth and throat.  . If you have numbness and tingling in your hands and feet, be careful when cooking, walking, and handling sharp objects and hot liquids.  . Talk with your nurse about getting a wig before you lose your hair. Also, call the [place] at 800-ACS-2345 to find out information about the "Look Good, Feel Better" program close to where you live. It is a free program where women getting chemotherapy can learn about wigs, turbans and scarves as well as makeup techniques and skin and nail care.  Food and Drug Interactions There are no known interactions of carboplatin with food. This drug may interact with other medicines. Tell your doctor and pharmacist about all the medicines and dietary supplements (vitamins, minerals, herbs and others) that you are taking at this time. The safety and use of dietary supplements and alternative diets are often not known. Using these might affect your cancer or interfere with your treatment. Until more is known,  you should not use dietary supplements or alternative diets without your cancer doctor's help.  When to Call the Doctor Call your doctor or nurse right away if you have any of these symptoms:  . Fever of 100.4 F (38 C) or above; chills  . Bleeding or bruising that is not normal  . Wheezing or trouble breathing  . Nausea that stops you from eating or drinking  . Vomiting  . Rash or itching  . Loose bowel movements (diarrhea) more than four times a day or diarrhea with weakness or feeling lightheaded  Call your doctor or nurse as soon as possible if any of these symptoms happen:  . Numbness, tingling, decreased feeling or weakness in fingers, toes, arms, or legs  . Change in hearing, ringing in the ears  . Blurred vision or other changes in eyesight  .  Decreased urine  . Yellowing of skin or eyes  Sexual Problems and Reproductive Concerns Sexual problems and reproduction concerns may happen. In both men and women, this drug may affect your ability to have children. This cannot be determined before your treatment. Talk with your doctor or nurse if you plan to have children. Ask for information on sperm or egg banking. In men, this drug may interfere with your ability to make sperm, but it should not change your ability to have sexual relations. In women, menstrual bleeding may become irregular or stop while you are getting this drug. Do not assume that you cannot become pregnant if you do not have a menstrual period. Women may go through signs of menopause (change of life) like vaginal dryness or itching. Vaginal lubricants can be used to lessen vaginal dryness, itching, and pain during sexual relations. Genetic counseling is available for you to talk about the effects of this drug therapy on future pregnancies. Also, a genetic counselor can look at the possible risk of problems in the unborn baby due to this medicine if an exposure happens during pregnancy.  . Pregnancy warning: This  drug may have harmful effects on the unborn child, so effective methods of birth control should be used during your cancer treatment.  . Breast feeding warning: It is not known if this drug passes into breast milk. For this reason, women should talk to their doctor about the risks and benefits of breast feeding during treatment with this drug because this drug may enter the breast milk and badly harm a breast feeding baby.   Pemetrexed (Generic Name) Other Names: ALIMTA  About This Drug Pemetrexed is used to treat cancer. It is given in the vein (IV).  Takes 10 minutes to infuse.   Possible Side Effects (More Common)  . Bone marrow depression. This is a decrease in the number of white blood cells, red blood cells, and platelets. This may raise your risk of infection, make you feel tired and weak (fatigue), and raise your risk of bleeding.  . Fatigue  . Soreness of the mouth and throat. You may have red areas, white patches, or sores that hurt.  Possible Side Effects (less common)  . Trouble breathing or feeling short of breath  . Nausea and vomiting  . Skin rash  Treating Side Effects  . Ask your doctor or nurse about medicine that is available to help stop or lessen nausea and throwing up.  . If you get a rash, do not put anything on it unless your doctor or nurse says you may. Keep the area around the rash clean and dry.  . Mouth care is very important. Your mouth care should consist of routine, gentle cleaning of your teeth or dentures and rinsing your mouth with a mixture of 1/2 teaspoon of salt in 8 ounces of water or  teaspoon of baking soda in 8 ounces of water. This should be done at least after each meal and at bedtime.  . If you have mouth sores, avoid mouthwash that has alcohol. Avoid alcohol and smoking because they can bother your mouth and throat.  Food and Drug Interactions There are no known interactions of this medicine with food. Tell your doctor if you are  taking ibuprofen. Pemetrexed may interact with other medicines. Tell your doctor and pharmacist about all the medicines and dietary supplements (vitamins, minerals, herbs and others) that you are taking at this time. The safety and use of dietary supplements and alternative  diets are often not known. Using these might affect your cancer or interfere with your treatment. Until more is known, you should not use dietary supplements or alternative diets without your cancer doctor's help.  When to Call the Doctor Call your doctor or nurse right away if you have any of these symptoms:  . Temperature of 100.4 F (38 C) or above  . Chills  . Easy bruising or bleeding  . Trouble breathing  . Chest pain  . Nausea that stops you from eating or drinking  . Throwing up/vomiting  Call your doctor or nurse as soon as possible if you have any of these symptoms:  . Rash that does not go away with prescribed medicine  . Nausea or vomiting that does not go away with prescribed medicine  . Extreme fatigue that interferes with normal activities  Reproduction Concerns . Pregnancy warning: This drug may have harmful effects on the unborn child, so effective methods of birth control should be used during your cancer treatment.  . Genetic counseling is available for you to talk about the effects of this drug therapy on future pregnancies. Also, a genetic counselor can look at the possible risk of problems in the unborn baby due to this medicine if an exposure happens during pregnancy.  . Breast feeding warning: It is not known if this drug passes into breast milk. For this reason, women should talk to their doctor about the risks and benefits of breast feeding during treatment with this drug because this drug may enter the breast milk and badly harm a breast feeding baby.   SELF CARE ACTIVITIES WHILE RECEIVING CHEMOTHERAPY:  Hydration Increase your fluid intake 48 hours prior to treatment and drink at  least 8 to 12 cups (64 ounces) of water/decaffeinated beverages per day after treatment. You can still have your cup of coffee or soda but these beverages do not count as part of your 8 to 12 cups that you need to drink daily. No alcohol intake.  Medications Continue taking your normal prescription medication as prescribed.  If you start any new herbal or new supplements please let us know first to make sure it is safe.  Mouth Care Have teeth cleaned professionally before starting treatment. Keep dentures and partial plates clean. Use soft toothbrush and do not use mouthwashes that contain alcohol. Biotene is a good mouthwash that is available at most pharmacies or may be ordered by calling [phone]. Use warm salt water gargles (1 teaspoon salt per 1 quart warm water) before and after meals and at bedtime. If you need dental work, please let the doctor know before you go for your appointment so that we can coordinate the best possible time for you in regards to your chemo regimen. You need to also let your dentist know that you are actively taking chemo. We may need to do labs prior to your dental appointment.  Skin Care Always use sunscreen that has not expired and with SPF (Sun Protection Factor) of 50 or higher. Wear hats to protect your head from the sun. Remember to use sunscreen on your hands, ears, face, & feet.  Use good moisturizing lotions such as udder cream, eucerin, or even Vaseline. Some chemotherapies can cause dry skin, color changes in your skin and nails.    . Avoid long, hot showers or baths. . Use gentle, fragrance-free soaps and laundry detergent. . Use moisturizers, preferably creams or ointments rather than lotions because the thicker consistency is better at preventing  skin dehydration. Apply the cream or ointment within 15 minutes of showering. Reapply moisturizer at night, and moisturize your hands every time after you wash them.  Hair Loss (if your doctor says your  hair will fall out)  . If your doctor says that your hair is likely to fall out, decide before you begin chemo whether you want to wear a wig. You may want to shop before treatment to match your hair color. . Hats, turbans, and scarves can also camouflage hair loss, although some people prefer to leave their heads uncovered. If you go bare-headed outdoors, be sure to use sunscreen on your scalp. . Cut your hair short. It eases the inconvenience of shedding lots of hair, but it also can reduce the emotional impact of watching your hair fall out. . Don't perm or color your hair during chemotherapy. Those chemical treatments are already damaging to hair and can enhance hair loss. Once your chemo treatments are done and your hair has grown back, it's OK to resume dyeing or perming hair.  With chemotherapy, hair loss is almost always temporary. But when it grows back, it may be a different color or texture. In older adults who still had hair color before chemotherapy, the new growth may be completely gray.  Often, new hair is very fine and soft.  Infection Prevention Please wash your hands for at least 30 seconds using warm soapy water. Handwashing is the #1 way to prevent the spread of germs. Stay away from sick people or people who are getting over a cold. If you develop respiratory systems such as green/yellow mucus production or productive cough or persistent cough let us know and we will see if you need an antibiotic. It is a good idea to keep a pair of gloves on when going into grocery stores/[employer] to decrease your risk of coming into contact with germs on the carts, etc. Carry alcohol hand gel with you at all times and use it frequently if out in public. If your temperature reaches 100.4 or higher please call the clinic and let us know.  If it is after hours or on the weekend please go to the ER if your temperature is over 100.4.  Please have your own personal thermometer at home to use.    Sex and  bodily fluids If you are going to have sex, a condom must be used to protect the person that isn't taking chemotherapy. Chemo can decrease your libido (sex drive). For a few days after chemotherapy, chemotherapy can be excreted through your bodily fluids.  When using the toilet please close the lid and flush the toilet twice.  Do this for a few day after you have had chemotherapy.   Effects of chemotherapy on your sex life Some changes are simple and won't last long. They won't affect your sex life permanently.  Sometimes you may feel: . too tired . not strong enough to be very active . sick or sore  . not in the mood . anxious or low  Your anxiety might not seem related to sex. For example, you may be worried about the cancer and how your treatment is going. Or you may be worried about money, or about how you family are coping with your illness.  These things can cause stress, which can affect your interest in sex. It's important to talk to your partner about how you feel.  Remember - the changes to your sex life don't usually last long. There's usually no  medical reason to stop having sex during chemo. The drugs won't have any long term physical effects on your performance or enjoyment of sex. Cancer can't be passed on to your partner during sex  Contraception It's important to use reliable contraception during treatment. Avoid getting pregnant while you or your partner are having chemotherapy. This is because the drugs may harm the baby. Sometimes chemotherapy drugs can leave a man or woman infertile.  This means you would not be able to have children in the future. You might want to talk to someone about permanent infertility. It can be very difficult to learn that you may no longer be able to have children. Some people find counselling helpful. There might be ways to preserve your fertility, although this is easier for men than for women. You may want to speak to a fertility expert. You can talk  about sperm banking or harvesting your eggs. You can also ask about other fertility options, such as donor eggs. If you have or have had breast cancer, your doctor might advise you not to take the contraceptive pill. This is because the hormones in it might affect the cancer. It is not known for sure whether or not chemotherapy drugs can be passed on through semen or secretions from the vagina. Because of this some doctors advise people to use a barrier method if you have sex during treatment. This applies to vaginal, anal or oral sex. Generally, doctors advise a barrier method only for the time you are actually having the treatment and for about a week after your treatment. Advice like this can be worrying, but this does not mean that you have to avoid being intimate with your partner. You can still have close contact with your partner and continue to enjoy sex.  Animals If you have cats or birds we just ask that you not change the litter or change the cage.  Please have someone else do this for you while you are on chemotherapy.   Food Safety During and After Cancer Treatment Food safety is important for people both during and after cancer treatment. Cancer and cancer treatments, such as chemotherapy, radiation therapy, and stem cell/bone marrow transplantation, often weaken the immune system. This makes it harder for your body to protect itself from foodborne illness, also called food poisoning. Foodborne illness is caused by eating food that contains harmful bacteria, parasites, or viruses.  Foods to avoid Some foods have a higher risk of becoming tainted with bacteria. These include: Marland Kitchen Unwashed fresh fruit and vegetables, especially leafy vegetables that can hide dirt and other contaminants . Raw sprouts, such as alfalfa sprouts . Raw or undercooked beef, especially ground beef, or other raw or undercooked meat and poultry . Fatty, fried, or spicy foods immediately before or after treatment.  These  can sit heavy on your stomach and make you feel nauseous. . Raw or undercooked shellfish, such as oysters. . Sushi and sashimi, which often contain raw fish.  . Unpasteurized beverages, such as unpasteurized fruit juices, raw milk, raw yogurt, or cider . Undercooked eggs, such as soft boiled, over easy, and poached; raw, unpasteurized eggs; or foods made with raw egg, such as homemade raw cookie dough and homemade mayonnaise  Simple steps for food safety  Shop smart. . Do not buy food stored or displayed in an unclean area. . Do not buy bruised or damaged fruits or vegetables. . Do not buy cans that have cracks, dents, or bulges. . Pick up foods  that can spoil at the end of your shopping trip and store them in a cooler on the way home.  Prepare and clean up foods carefully. . Rinse all fresh fruits and vegetables under running water, and dry them with a clean towel or paper towel. . Clean the top of cans before opening them. . After preparing food, wash your hands for 20 seconds with hot water and soap. Pay special attention to areas between fingers and under nails. . Clean your utensils and dishes with hot water and soap. Marland Kitchen Disinfect your kitchen and cutting boards using 1 teaspoon of liquid, unscented bleach mixed into 1 quart of water.    Dispose of old food. . Eat canned and packaged food before its expiration date (the "use by" or "best before" date). . Consume refrigerated leftovers within 3 to 4 days. After that time, throw out the food. Even if the food does not smell or look spoiled, it still may be unsafe. Some bacteria, such as Listeria, can grow even on foods stored in the refrigerator if they are kept for too long.  Take precautions when eating out. . At restaurants, avoid buffets and salad bars where food sits out for a long time and comes in contact with many people. Food can become contaminated when someone with a virus, often a norovirus, or another "bug" handles it. . Put  any leftover food in a "to-go" container yourself, rather than having the server do it. And, refrigerate leftovers as soon as you get home. . Choose restaurants that are clean and that are willing to prepare your food as you order it cooked.   AT HOME MEDICATIONS:                                                                                                                                                                Compazine/Prochlorperazine 10mg  tablet. Take 1 tablet every 6 hours as needed for nausea/vomiting. (This can make you sleepy)   EMLA cream. Apply a quarter size amount to port site 1 hour prior to chemo. Do not rub in. Cover with plastic wrap.    Diarrhea Sheet   If you are having loose stools/diarrhea, please purchase Imodium and begin taking as outlined:  At the first sign of poorly formed or loose stools you should begin taking Imodium (loperamide) 2 mg capsules.  Take two tablets (4mg ) followed by one tablet (2mg ) every 2 hours - DO NOT EXCEED 8 tablets in 24 hours.  If it is bedtime and you are having loose stools, take 2 tablets at bedtime, then 2 tablets every 4 hours until morning.   Always call the [place] if you are having loose stools/diarrhea that you can't get under control.  Loose stools/diarrhea leads to dehydration (loss  of water) in your body.  We have other options of trying to get the loose stools/diarrhea to stop but you must let us know!   Constipation Sheet  Colace - 100 mg capsules - take 2 capsules daily.  If this doesn't help then you can increase to 2 capsules twice daily.  Please call if the above does not work for you. Do not go more than 2 days without a bowel movement.  It is very important that you do not become constipated.  It will make you feel sick to your stomach (nausea) and can cause abdominal pain and vomiting.  Nausea Sheet   Compazine/Prochlorperazine 10mg  tablet. Take 1 tablet every 6 hours as needed for nausea/vomiting (This  can make you drowsy).  If you are having persistent nausea (nausea that does not stop) please call the [facility] and let us know the amount of nausea that you are experiencing.  If you begin to vomit, you need to call the [place] and if it is the weekend and you have vomited more than one time and can't get it to stop-go to the Emergency Room.  Persistent nausea/vomiting can lead to dehydration (loss of fluid in your body) and will make you feel very weak and unwell. Ice chips, sips of clear liquids, foods that are at room temperature, crackers, and toast tend to be better tolerated.   SYMPTOMS TO REPORT AS SOON AS POSSIBLE AFTER TREATMENT:  FEVER GREATER THAN 100.4 F  CHILLS WITH OR WITHOUT FEVER  NAUSEA AND VOMITING THAT IS NOT CONTROLLED WITH YOUR NAUSEA MEDICATION  UNUSUAL SHORTNESS OF BREATH  UNUSUAL BRUISING OR BLEEDING  TENDERNESS IN MOUTH AND THROAT WITH OR WITHOUT PRESENCE OF ULCERS  URINARY PROBLEMS  BOWEL PROBLEMS  UNUSUAL RASH      Wear comfortable clothing and clothing appropriate for easy access to any Portacath or PICC line. Let us know if there is anything that we can do to make your therapy better!    What to do if you need assistance after hours or on the weekends: CALL [phone].  HOLD on the line, do not hang up.  You will hear multiple messages but at the end you will be connected with a nurse triage line.  They will contact the doctor if necessary.  Most of the time they will be able to assist you.  Do not call the hospital operator.      I have been informed and understand all of the instructions given to me and have received a copy. I have been instructed to call the clinic [phone] or my family physician as soon as possible for continued medical care, if indicated. I do not have any more questions at this time but understand that I may call the [place] or the Patient Navigator at [phone] during office hours should I have  questions or need assistance in obtaining follow-up care.

## 2019-08-20 ENCOUNTER — Ambulatory Visit (HOSPITAL_COMMUNITY): Payer: Medicaid Other

## 2019-08-20 ENCOUNTER — Other Ambulatory Visit (HOSPITAL_COMMUNITY): Payer: Medicaid Other

## 2019-08-20 ENCOUNTER — Inpatient Hospital Stay (HOSPITAL_BASED_OUTPATIENT_CLINIC_OR_DEPARTMENT_OTHER): Payer: Medicaid Other | Admitting: Hematology

## 2019-08-20 ENCOUNTER — Other Ambulatory Visit: Payer: Self-pay

## 2019-08-20 ENCOUNTER — Inpatient Hospital Stay (HOSPITAL_COMMUNITY): Payer: Medicaid Other

## 2019-08-20 VITALS — BP 110/75 | HR 104 | Temp 96.8°F | Resp 18 | Wt 161.5 lb

## 2019-08-20 DIAGNOSIS — C3431 Malignant neoplasm of lower lobe, right bronchus or lung: Secondary | ICD-10-CM | POA: Diagnosis not present

## 2019-08-20 DIAGNOSIS — C3491 Malignant neoplasm of unspecified part of right bronchus or lung: Secondary | ICD-10-CM | POA: Diagnosis not present

## 2019-08-20 MED ORDER — FULVESTRANT 250 MG/5ML IM SOLN
INTRAMUSCULAR | Status: AC
  Filled 2019-08-20: qty 5

## 2019-08-20 NOTE — Progress Notes (Signed)
[place], [place] [address]   CLINIC:  Medical Oncology/Hematology  PCP:  Celene Squibb, MD [address] [name] [address] [phone]   REASON FOR VISIT:  Follow-up for stage III right lung cancer  PRIOR THERAPY: Right lower lobectomy on 06/19/2019  NGS Results: Pending  CURRENT THERAPY: Observation  BRIEF ONCOLOGIC HISTORY:  Oncology History  Adenocarcinoma of lung, stage 3, right ([facility])  07/02/2019 Initial Diagnosis   Adenocarcinoma of lung, stage 3, right ([place])   07/18/2019 Genetic Testing   Foundation One     07/23/2019 Genetic Testing   PD-L1     08/12/2019 -  Chemotherapy   The patient had palonosetron (ALOXI) injection 0.25 mg, 0.25 mg, Intravenous,  Once, 0 of 4 cycles PEMEtrexed (ALIMTA) 900 mg in sodium chloride 0.9 % 100 mL chemo infusion, 500 mg/m2, Intravenous,  Once, 0 of 4 cycles CARBOplatin (PARAPLATIN) in sodium chloride 0.9 % 100 mL chemo infusion, , Intravenous,  Once, 0 of 4 cycles fosaprepitant (EMEND) 150 mg in sodium chloride 0.9 % 145 mL IVPB, 150 mg, Intravenous,  Once, 0 of 4 cycles  for chemotherapy treatment.      CANCER STAGING: Cancer Staging No matching staging information was found for the patient.  INTERVAL HISTORY:  Ms. Elizabth Palka, a 54 y.o. female, returns for routine follow-up of her right lung cancer. Mattalynn was last seen on 08/14/2019. She was supposed to have a left chest Port-a-Cath placed on 08/18/2019 by Dr. Arnoldo Morale, but refused.  Today she reports that she started having diarrhea after her previous visit here on 08/14/2019 most likely due to her nerves; the diarrhea is watery and occurs after every meal or fluid intake. She was also turned off from getting the port since one of the risks of the operation was lung collapse. She is also inquiring if she needs to have chemo for her lung cancer. She drinks 7-8 cans of diet [facility] daily; she only drinks water when she takes  her medications. She started taking the folic acid and the W29 shot.   REVIEW OF SYSTEMS:  Review of Systems  Constitutional: Positive for appetite change (moderately decreased) and fatigue (severe).  HENT:   Positive for trouble swallowing (w/ tablets).   Gastrointestinal: Positive for diarrhea.  Neurological: Positive for headaches.  Psychiatric/Behavioral: Positive for depression and sleep disturbance. The patient is nervous/anxious.   All other systems reviewed and are negative.   PAST MEDICAL/SURGICAL HISTORY:  Past Medical History:  Diagnosis Date  . Alcoholic hepatitis without ascites   . Anxiety   . Arthritis    knees, hands  . 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 Bipolar disorder ([place])   . Cancer ([place])    right lung  . COPD (chronic obstructive pulmonary disease) ([place])   . Depression   . Dyspnea   . Emphysema lung ([place])   . GERD (gastroesophageal reflux disease)   . Hepatitis C    s/p treatment with Epclusa  . History of HPV infection   . History of pneumonia   . Pneumonia   . Smoker    Past Surgical History:  Procedure Laterality Date  . BIOPSY  05/02/2018   Procedure: BIOPSY;  Surgeon: Daneil Dolin, MD;  [facility];  Service: Endoscopy;;  gastric  . CESAREAN SECTION    . CHEST TUBE INSERTION Right 06/26/2019   Procedure: Right CHEST TUBE REPLACEMENT;  Surgeon: Roxan Hockey,  Revonda Standard, MD;  Location: [place];  Service: Thoracic;  Laterality: Right;  . COLONOSCOPY WITH PROPOFOL N/A 03/03/2019   Procedure: COLONOSCOPY WITH PROPOFOL;  Surgeon: Daneil Dolin, MD;  [facility];  Service: Endoscopy;  Laterality: N/A;  9:15am  . ESOPHAGOGASTRODUODENOSCOPY (EGD) WITH PROPOFOL N/A 05/02/2018   Procedure: ESOPHAGOGASTRODUODENOSCOPY (EGD) WITH PROPOFOL;  Surgeon: Daneil Dolin, MD;  [facility];  Service: Endoscopy;  Laterality: N/A;  8:30am  . EYE SURGERY Bilateral    cataract  . HEMOSTASIS CLIP PLACEMENT   03/03/2019   Procedure: HEMOSTASIS CLIP PLACEMENT;  Surgeon: Daneil Dolin, MD;  [facility];  Service: Endoscopy;;  . INTERCOSTAL NERVE BLOCK Right 06/19/2019   Procedure: Intercostal Nerve Block;  Surgeon: [name], MD;  Location: [facility];  Service: Thoracic;  Laterality: Right;  . IR US GUIDE VASC ACCESS RIGHT  03/13/2018  . LOBECTOMY    . LYMPH NODE DISSECTION Right 06/19/2019   Procedure: Lymph Node Dissection;  Surgeon: [name], MD;  Location: [facility];  Service: Thoracic;  Laterality: Right;  . POLYPECTOMY  03/03/2019   Procedure: POLYPECTOMY;  Surgeon: Daneil Dolin, MD;  [facility];  Service: Endoscopy;;  . TONSILLECTOMY    . TOOTH EXTRACTION  01/31/2018   x 7  . VIDEO BRONCHOSCOPY WITH INSERTION OF INTERBRONCHIAL VALVE (IBV) N/A 06/26/2019   Procedure: VIDEO BRONCHOSCOPY WITH INSERTION OF TWO INTERBRONCHIAL VALVE (IBV);  Surgeon: [name], MD;  Location: [facility] OR;  Service: Thoracic;  Laterality: N/A;  . VIDEO BRONCHOSCOPY WITH INSERTION OF INTERBRONCHIAL VALVE (IBV) N/A 08/08/2019   Procedure: VIDEO BRONCHOSCOPY WITH REMOVAL OF INTERBRONCHIAL VALVE (IBV);  Surgeon: [name], MD;  Location: [facility] OR;  Service: Thoracic;  Laterality: N/A;    SOCIAL HISTORY:  Social History   Socioeconomic History  . Marital status: Single    Spouse name: Not on file  . Number of children: Not on file  . Years of education: Not on file  . Highest education level: Not on file  Occupational History  . Not on file  Tobacco Use  . Smoking status: Former Smoker    Packs/day: 0.50    Years: 38.00    Pack years: 19.00    Types: Cigarettes    Quit date: 06/19/2019    Years since quitting: 0.1  . Smokeless tobacco: Never Used  Vaping Use  . Vaping Use: Never used  Substance and Sexual Activity  . Alcohol use: Not Currently    Comment: Last use of alcohol 03/2016  . 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  Sexual activity: Not on file  Other Topics Concern  . Not on file  Social History Narrative  . Not on file   Social Determinants of Health   Financial Resource Strain:   . Difficulty of Paying Living Expenses:   Food Insecurity:   . Worried About Charity fundraiser in the Last Year:   . Arboriculturist in the Last Year:   Transportation Needs:   . Film/video editor (Medical):   Marland Kitchen Lack of Transportation (Non-Medical):   Physical Activity:   . Days of Exercise per Week:   . Minutes of Exercise per Session:   Stress:   . Feeling of Stress :   Social Connections:   . Frequency of Communication with Friends and Family:   . Frequency of Social Gatherings  with Friends and Family:   . Attends Religious Services:   . Active Member of Clubs or Organizations:   . Attends Archivist Meetings:   Marland Kitchen Marital Status:   Intimate Partner Violence:   . Fear of Current or Ex-Partner:   . Emotionally Abused:   Marland Kitchen Physically Abused:   . Sexually Abused:     FAMILY HISTORY:  Family History  Problem Relation Age of Onset  . Congenital heart disease Mother   . Bipolar disorder Mother   . Prostate cancer Brother   . Alzheimer's disease Paternal Grandmother   . Colon cancer Neg Hx     CURRENT MEDICATIONS:  Current Outpatient Medications  Medication Sig Dispense Refill  . albuterol (VENTOLIN HFA) 108 (90 Base) MCG/ACT inhaler Inhale 2 puffs into the lungs every 4 (four) hours as needed for wheezing or shortness of breath.  (Patient not taking: Reported on 08/14/2019)    . [START ON 08/27/2019] CARBOPLATIN IV Inject into the vein every 21 ( twenty-one) days.    . diphenhydrAMINE (BENADRYL) 25 MG tablet Take 25 mg by mouth at bedtime.     . divalproex (DEPAKOTE) 250 MG DR tablet Take 250 mg by mouth at bedtime.     . pantoprazole (PROTONIX) 40 MG tablet Take one tablet before breakfast daily. (Patient taking differently: Take 40 mg by mouth daily before breakfast. ) 90 tablet 3  .  [START ON 08/27/2019] PEMEtrexed 500 mg/m2 in sodium chloride 0.9 % 100 mL Inject 500 mg/m2 into the vein every 21 ( twenty-one) days.    . risperiDONE (RISPERDAL) 2 MG tablet Take 2 mg by mouth at bedtime.     . SYMBICORT 160-4.5 MCG/ACT inhaler Inhale 2 puffs into the lungs 2 (two) times daily.    . traMADol (ULTRAM) 50 MG tablet Take 1 tablet (50 mg total) by mouth every 6 (six) hours as needed. (Patient not taking: Reported on 08/14/2019) 28 tablet 0   No current facility-administered medications for this visit.    ALLERGIES:  Allergies  Allergen Reactions  . Folic Acid Anaphylaxis  . Penicillins Anaphylaxis    Throat swelled Did it involve swelling of the face/tongue/throat, SOB, or low BP? Yes Did it involve sudden or severe rash/hives, skin peeling, or any reaction on the inside of your mouth or nose? No Did you need to seek medical attention at a hospital or doctor's office? No When did it last happen?childhood allergy If all above answers are "NO", may proceed with cephalosporin use.   Marland Kitchen Amoxicillin     hallucinations Did it involve swelling of the face/tongue/throat, SOB, or low BP? No Did it involve sudden or severe rash/hives, skin peeling, or any reaction on the inside of your mouth or nose? No Did you need to seek medical attention at a hospital or doctor's office? Yes When did it last happen?July 2019 If all above answers are "NO", may proceed with cephalosporin use.   . Fish Allergy Nausea Only  . Other Swelling    Patient states that the sunrise brand folic acid made her feel like her throat was swelling.     PHYSICAL EXAM:  Performance status (ECOG): 1 - Symptomatic but completely ambulatory  Vitals:   08/20/19 1017  BP: 110/75  Pulse: (!) 104  Resp: 18  Temp: (!) 96.8 F (36 C)  SpO2: 95%   Wt Readings from Last 3 Encounters:  08/20/19 161 lb 8 oz (73.3 kg)  08/14/19 165 lb 4.8 oz (75 kg)  08/08/19 165 lb 11.2 oz (75.2 kg)   Physical  Exam Vitals reviewed.  Constitutional:      Appearance: Normal appearance. She is obese.  Cardiovascular:     Rate and Rhythm: Normal rate and regular rhythm.     Pulses: Normal pulses.     Heart sounds: Normal heart sounds.  Pulmonary:     Effort: Pulmonary effort is normal.     Breath sounds: Normal breath sounds.  Neurological:     General: No focal deficit present.     Mental Status: She is alert and oriented to person, place, and time.  Psychiatric:        Mood and Affect: Mood normal.        Behavior: Behavior normal.      LABORATORY DATA:  I have reviewed the labs as listed.  CBC Latest Ref Rng & Units 08/05/2019 06/21/2019 06/20/2019  WBC 4.0 - 10.5 K/uL 7.4 13.1(H) 14.6(H)  Hemoglobin 12.0 - 15.0 g/dL 13.4 10.2(L) 11.5(L)  Hematocrit 36 - 46 % 43.4 30.9(L) 35.7(L)  Platelets 150 - 400 K/uL 185 114(L) 147(L)   CMP Latest Ref Rng & Units 08/05/2019 06/21/2019 06/20/2019  Glucose 70 - 99 mg/dL 90 150(H) 102(H)  BUN 6 - 20 mg/dL <5(L) 14 9  Creatinine 0.44 - 1.00 mg/dL 0.62 0.76 0.72  Sodium 135 - 145 mmol/L 136 131(L) 135  Potassium 3.5 - 5.1 mmol/L 3.6 4.6 4.6  Chloride 98 - 111 mmol/L 99 95(L) 102  CO2 22 - 32 mmol/L 25 25 25   Calcium 8.9 - 10.3 mg/dL 9.8 8.5(L) 8.0(L)  Total Protein 6.5 - 8.1 g/dL 7.8 6.2(L) -  Total Bilirubin 0.3 - 1.2 mg/dL 0.6 1.0 -  Alkaline Phos 38 - 126 U/L 69 46 -  AST 15 - 41 U/L 25 27 -  ALT 0 - 44 U/L 16 18 -    DIAGNOSTIC IMAGING:  I have independently reviewed the scans and discussed with the patient. DG Chest 2 View  Result Date: 08/05/2019 CLINICAL DATA:  Preadmit lung surgery air-leak short of breath EXAM: CHEST - 2 VIEW COMPARISON:  07/15/2019, 07/02/2019, 07/01/2019 FINDINGS: Postsurgical changes in the right perihilar lung. Small residual right pleural effusion with fluid along the fissure, decreased since 07/15/2019. Left lung is clear. Small right apical pneumothorax, similar in size as compared with 07/02/2019, possibly obscured on  07/15/2019 by small amount of hazy fluid at the apex. Stable cardiomediastinal silhouette with aortic atherosclerosis. IMPRESSION: 1. Small right apical pneumothorax with small residual right pleural effusion but with improved aeration at the right lung base since 07/15/2019. Apical pneumothorax is probably not significantly changed in size as compared with 07/02/2019. These results will be called to the ordering clinician or representative by the Radiologist Assistant, and communication documented in the PACS or [employer]. Electronically Signed   By: Donavan Foil M.D.   On: 08/05/2019 15:32     ASSESSMENT:  1.Stage IIIa (PT1CPN2) right lung adenocarcinoma: -PET scan on 05/13/2019 showed right lower lobe nodule concerning for bronchogenic carcinoma. Cirrhotic liver. -Right lower lobectomy and lymph node excision on 06/19/2019. -Pathology showed 1.7 cm right lower lobe adenocarcinoma, unifocal, lymphovascular invasion present. Margins negative. 2/14 lymph nodes positive (level 7 and 10R). PT1CPN2. -PD-L1 30%, K-ras G12A, MSI stable.  EGFR mutation not identified.   PLAN:  1.Stage III right lung adenocarcinoma: -I have recommended 4 cycles of adjuvant chemotherapy with carboplatin and pemetrexed. -She got very nervous and had developed diarrhea.  Anxiety usually makes her to  have diarrhea.  She is seeing her psychiatrist soon and medications will be changed. -Because of diarrhea, she called Dr. Arnoldo Morale and canceled a port placement. -I have talked to her at length about the importance of adjuvant chemotherapy as though she had node positive disease and puts her at high risk of recurrence. -We talked about the side effects of the chemotherapy again.  She is willing to consider it again. -She will call Dr. Arnoldo Morale office and will make another appointment for port placement.  She will continue folic acid which she is tolerating well.  She was already given vitamin B12 injection. -We will  see her back after port placement to start first cycle of chemotherapy. -Because of possible interactions, will likely avoid Compazine.  Will give Zofran for nausea prophylaxis at home along with scopolamine patch.  2. Immune mediated thrombocytopenia: -She was treated for hepatitis C in the past and was reportedly cured.  She is having testing done. -She also has cirrhosis.  Her last platelet count has normalized.   Orders placed this encounter:  No orders of the defined types were placed in this encounter.    Derek Jack, MD [place] [phone]   I, Milinda Antis, am acting as a scribe for Dr. Sanda Linger.  I, Derek Jack MD, have reviewed the above documentation for accuracy and completeness, and I agree with the above.

## 2019-08-20 NOTE — Progress Notes (Unsigned)

## 2019-08-20 NOTE — Patient Instructions (Addendum)
[place] at [facility] Discharge Instructions  You were seen today by Dr. Delton Coombes. He went over your recent results. Cut down to drinking a maximum of 4 cans of [employer] daily and drink more water. Take Imodium as needed for your diarrhea. You will be referred to a surgeon for port placement. Dr. Delton Coombes will see you back after your port is placed for labs and follow up.   Thank you for choosing [place] at [facility] to provide your oncology and hematology care.  To afford each patient quality time with our provider, please arrive at least 15 minutes before your scheduled appointment time.   If you have a lab appointment with the [facility] please come in thru the Main Entrance and check in at the main information desk  You need to re-schedule your appointment should you arrive 10 or more minutes late.  We strive to give you quality time with our providers, and arriving late affects you and other patients whose appointments are after yours.  Also, if you no show three or more times for appointments you may be dismissed from the clinic at the providers discretion.     Again, thank you for choosing [facility].  Our hope is that these requests will decrease the amount of time that you wait before being seen by our physicians.       _____________________________________________________________  Should you have questions after your visit to [facility], please contact our office at [phone] between the hours of 8:00 a.m. and 4:30 p.m.  Voicemails left after 4:00 p.m. will not be returned until the following business day.  For prescription refill requests, have your pharmacy contact our office and allow 72 hours.    Cancer Center Support Programs:   > Cancer Support Group  2nd Tuesday of the month 1pm-2pm, Journey Room

## 2019-08-25 NOTE — H&P (Addendum)
Rachel Gross; [id number]; 1965-09-15   HPI Patient is a 54 year old white female who was referred to my care by Dr. Delton Coombes for Port-A-Cath insertion.  She has right lung carcinoma and is in need for central venous access for chemotherapy Past Medical History:  Diagnosis Date  . Alcoholic hepatitis without ascites   . Anxiety   . Arthritis    knees, hands  . 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 Bipolar disorder ([employer])   . Cancer ([place])    right lung  . COPD (chronic obstructive pulmonary disease) ([place])   . Depression   . Emphysema lung ([place])   . GERD (gastroesophageal reflux disease)   . Hepatitis C    s/p treatment with Epclusa  . History of HPV infection   . History of pneumonia   . Pneumonia   . Smoker     Past Surgical History:  Procedure Laterality Date  . BIOPSY  05/02/2018   Procedure: BIOPSY;  Surgeon: Daneil Dolin, MD;  [facility];  Service: Endoscopy;;  gastric  . CESAREAN SECTION    . CHEST TUBE INSERTION Right 06/26/2019   Procedure: Right CHEST TUBE REPLACEMENT;  Surgeon: [name], MD;  Location: [place];  Service: Thoracic;  Laterality: Right;  . COLONOSCOPY WITH PROPOFOL N/A 03/03/2019   Procedure: COLONOSCOPY WITH PROPOFOL;  Surgeon: Daneil Dolin, MD;  [facility];  Service: Endoscopy;  Laterality: N/A;  9:15am  . ESOPHAGOGASTRODUODENOSCOPY (EGD) WITH PROPOFOL N/A 05/02/2018   Procedure: ESOPHAGOGASTRODUODENOSCOPY (EGD) WITH PROPOFOL;  Surgeon: Daneil Dolin, MD;  [facility];  Service: Endoscopy;  Laterality: N/A;  8:30am  . EYE SURGERY Bilateral    cataract  . HEMOSTASIS CLIP PLACEMENT  03/03/2019   Procedure: HEMOSTASIS CLIP PLACEMENT;  Surgeon: Daneil Dolin, MD;  [facility];  Service: Endoscopy;;  . INTERCOSTAL NERVE BLOCK Right 06/19/2019   Procedure: Intercostal Nerve Block;  Surgeon: [name], MD;  Location: [place];  Service: Thoracic;  Laterality:  Right;  . IR US GUIDE VASC ACCESS RIGHT  03/13/2018  . LYMPH NODE DISSECTION Right 06/19/2019   Procedure: Lymph Node Dissection;  Surgeon: [name], MD;  Location: [place];  Service: Thoracic;  Laterality: Right;  . POLYPECTOMY  03/03/2019   Procedure: POLYPECTOMY;  Surgeon: Daneil Dolin, MD;  [facility];  Service: Endoscopy;;  . TONSILLECTOMY    . TOOTH EXTRACTION  01/31/2018   x 7  . VIDEO BRONCHOSCOPY WITH INSERTION OF INTERBRONCHIAL VALVE (IBV) N/A 06/26/2019   Procedure: VIDEO BRONCHOSCOPY WITH INSERTION OF TWO INTERBRONCHIAL VALVE (IBV);  Surgeon: [name], MD;  [facility];  Service: Thoracic;  Laterality: N/A;    Family History  Problem Relation Age of Onset  . Congenital heart disease Mother   . Bipolar disorder Mother   . Prostate cancer Brother   . Alzheimer's disease Paternal Grandmother   . Colon cancer Neg Hx     Current Outpatient Medications on File Prior to Visit  Medication Sig Dispense Refill  . albuterol (VENTOLIN HFA) 108 (90 Base) MCG/ACT inhaler Inhale 2 puffs into the lungs every 4 (four) hours as needed for wheezing or shortness of breath.     . diphenhydrAMINE (BENADRYL) 25 MG tablet Take 25 mg by mouth at bedtime.     . divalproex (DEPAKOTE) 250 MG DR tablet Take 250 mg by mouth at bedtime.     . pantoprazole (PROTONIX)  40 MG tablet Take one tablet before breakfast daily. (Patient taking differently: Take 40 mg by mouth daily before breakfast. ) 90 tablet 3  . risperiDONE (RISPERDAL) 2 MG tablet Take 2 mg by mouth at bedtime.     . SYMBICORT 160-4.5 MCG/ACT inhaler Inhale 2 puffs into the lungs 2 (two) times daily.    . traMADol (ULTRAM) 50 MG tablet Take 1 tablet (50 mg total) by mouth every 6 (six) hours as needed. 28 tablet 0   No current facility-administered medications on file prior to visit.    Allergies  Allergen Reactions  . Penicillins Anaphylaxis    Throat swelled Did it involve swelling of the  face/tongue/throat, SOB, or low BP? Yes Did it involve sudden or severe rash/hives, skin peeling, or any reaction on the inside of your mouth or nose? No Did you need to seek medical attention at a hospital or doctor's office? No When did it last happen?childhood allergy If all above answers are "NO", may proceed with cephalosporin use.   Marland Kitchen Amoxicillin     hallucinations Did it involve swelling of the face/tongue/throat, SOB, or low BP? No Did it involve sudden or severe rash/hives, skin peeling, or any reaction on the inside of your mouth or nose? No Did you need to seek medical attention at a hospital or doctor's office? Yes When did it last happen?July 2019 If all above answers are "NO", may proceed with cephalosporin use.   . Fish Allergy Nausea Only    Social History   Substance and Sexual Activity  Alcohol Use Not Currently   Comment: Last use of alcohol 03/2016    Social History   Tobacco Use  Smoking Status Former Smoker  . Packs/day: 0.50  . Years: 38.00  . Pack years: 19.00  . Types: Cigarettes  . Quit date: 06/19/2019  . Years since quitting: 0.0  Smokeless Tobacco Never Used    Review of Systems  Constitutional: Positive for malaise/fatigue.  HENT: Positive for sinus pain.   Eyes: Negative.   Respiratory: Positive for shortness of breath and wheezing.   Cardiovascular: Positive for chest pain.  Gastrointestinal: Positive for heartburn.  Genitourinary: Negative.   Musculoskeletal: Negative.   Skin: Negative.   Neurological: Positive for tremors and sensory change.  Endo/Heme/Allergies: Negative.   Psychiatric/Behavioral: The patient is nervous/anxious.     Objective   Vitals:   07/24/19 0953  BP: 107/74  Pulse: 93  Resp: 20  Temp: (!) 96.8 F (36 C)  SpO2: 94%    Physical Exam Vitals reviewed.  Constitutional:      Appearance: Normal appearance.  HENT:     Head: Normocephalic and atraumatic.  Cardiovascular:     Rate and  Rhythm: Normal rate and regular rhythm.     Heart sounds: Normal heart sounds. No murmur heard.  No friction rub. No gallop.   Pulmonary:     Effort: Pulmonary effort is normal. No respiratory distress.     Breath sounds: No stridor. Wheezing present. No rhonchi or rales.  Skin:    General: Skin is warm and dry.  Neurological:     Mental Status: She is alert and oriented to person, place, and time.   Dr. Tomie China notes reviewed  Assessment  Right lung carcinoma, need for central venous access Plan   Patient is scheduled for Port-A-Cath insertion on 09/08/2019.  The risks and benefits of the procedure including bleeding, infection, and pneumothorax were fully explained to the patient, who gave informed consent.

## 2019-08-27 ENCOUNTER — Other Ambulatory Visit (HOSPITAL_COMMUNITY): Payer: Self-pay | Admitting: *Deleted

## 2019-08-27 MED ORDER — ONDANSETRON 8 MG PO TBDP: ORAL_TABLET | ORAL | 1 refills | Status: DC

## 2019-08-27 MED ORDER — SCOPOLAMINE 1 MG/3DAYS TD PT72: 1.0000 | MEDICATED_PATCH | TRANSDERMAL | 12 refills | Status: DC

## 2019-08-27 MED ORDER — LIDOCAINE-PRILOCAINE 2.5-2.5 % EX CREA
TOPICAL_CREAM | CUTANEOUS | 3 refills | Status: DC
Start: 2019-08-27 — End: 2023-11-13

## 2019-09-01 ENCOUNTER — Other Ambulatory Visit (HOSPITAL_COMMUNITY): Payer: Self-pay | Admitting: *Deleted

## 2019-09-04 ENCOUNTER — Encounter (HOSPITAL_COMMUNITY)
Admission: RE | Admit: 2019-09-04 | Discharge: 2019-09-04 | Disposition: A | Discharge status: Home / Self Care | Payer: Medicaid Other | Source: Ambulatory Visit | Attending: General Surgery | Admitting: General Surgery

## 2019-09-04 ENCOUNTER — Other Ambulatory Visit: Payer: Self-pay

## 2019-09-04 ENCOUNTER — Encounter (HOSPITAL_COMMUNITY): Payer: Self-pay

## 2019-09-04 NOTE — Patient Instructions (Signed)
Kathyrn Warmuth  09/04/2019     @PREFPERIOPPHARMACY @   Your procedure is scheduled on  09/08/2019.                                                      COVID test @ 2PM 09/05/2019.  Report to [facility] at  0700  A.M.  Call this number if you have problems the morning of surgery:  [phone]   Remember:  Do not eat or drink after midnight.                       Take these medicines the morning of surgery with A SIP OF WATER  Zofran(if needed), protonix, tramadol.    Do not wear jewelry, make-up or nail polish.  Do not wear lotions, powders, or perfumes, or deodorant. Please brush your teeth.  Do not shave 48 hours prior to surgery.  Men may shave face and neck.  Do not bring valuables to the hospital.  [facility] is not responsible for any belongings or valuables.  Contacts, dentures or bridgework may not be worn into surgery.  Leave your suitcase in the car.  After surgery it may be brought to your room.  For patients admitted to the hospital, discharge time will be determined by your treatment team.  Patients discharged the day of surgery will not be allowed to drive home.   Name and phone number of your driver:   family Special instructions:  DO NOT smoke the morning of your procedure.  Please read over the following fact sheets that you were given. Anesthesia Post-op Instructions and Care and Recovery After Surgery       Implanted Port Insertion, Care After This sheet gives you information about how to care for yourself after your procedure. Your health care provider may also give you more specific instructions. If you have problems or questions, contact your health care provider. What can I expect after the procedure? After the procedure, it is common to have:  Discomfort at the port insertion site.  Bruising on the skin over the port. This should improve over 3-4 days. Follow these instructions at home: [facility] care  After your port is placed, you  will get a manufacturer's information card. The card has information about your port. Keep this card with you at all times.  Take care of the port as told by your health care provider. Ask your health care provider if you or a family member can get training for taking care of the port at home. A home health care nurse may also take care of the port.  Make sure to remember what type of port you have. Incision care      Follow instructions from your health care provider about how to take care of your port insertion site. Make sure you: ? Wash your hands with soap and water before and after you change your bandage (dressing). If soap and water are not available, use hand sanitizer. ? Change your dressing as told by your health care provider. ? Leave stitches (sutures), skin glue, or adhesive strips in place. These skin closures may need to stay in place for 2 weeks or longer. If adhesive strip edges start to loosen and curl up, you may  trim the loose edges. Do not remove adhesive strips completely unless your health care provider tells you to do that.  Check your port insertion site every day for signs of infection. Check for: ? Redness, swelling, or pain. ? Fluid or blood. ? Warmth. ? Pus or a bad smell. Activity  Return to your normal activities as told by your health care provider. Ask your health care provider what activities are safe for you.  Do not lift anything that is heavier than 10 lb (4.5 kg), or the limit that you are told, until your health care provider says that it is safe. General instructions  Take over-the-counter and prescription medicines only as told by your health care provider.  Do not take baths, swim, or use a hot tub until your health care provider approves. Ask your health care provider if you may take showers. You may only be allowed to take sponge baths.  Do not drive for 24 hours if you were given a sedative during your procedure.  Wear a medical alert  bracelet in case of an emergency. This will tell any health care providers that you have a port.  Keep all follow-up visits as told by your health care provider. This is important. Contact a health care provider if:  You cannot flush your port with saline as directed, or you cannot draw blood from the port.  You have a fever or chills.  You have redness, swelling, or pain around your port insertion site.  You have fluid or blood coming from your port insertion site.  Your port insertion site feels warm to the touch.  You have pus or a bad smell coming from the port insertion site. Get help right away if:  You have chest pain or shortness of breath.  You have bleeding from your port that you cannot control. Summary  Take care of the port as told by your health care provider. Keep the manufacturer's information card with you at all times.  Change your dressing as told by your health care provider.  Contact a health care provider if you have a fever or chills or if you have redness, swelling, or pain around your port insertion site.  Keep all follow-up visits as told by your health care provider. This information is not intended to replace advice given to you by your health care provider. Make sure you discuss any questions you have with your health care provider. Document Revised: 08/14/2017 Document Reviewed: 08/14/2017 Elsevier Patient Education  [place] An implanted port is a device that is placed under the skin. It is usually placed in the chest. The device can be used to give IV medicine, to take blood, or for dialysis. You may have an implanted port if:  You need IV medicine that would be irritating to the small veins in your hands or arms.  You need IV medicines, such as antibiotics, for a long period of time.  You need IV nutrition for a long period of time.  You need dialysis. Having a port means that your health care provider  will not need to use the veins in your arms for these procedures. You may have fewer limitations when using a port than you would if you used other types of long-term IVs, and you will likely be able to return to normal activities after your incision heals. An implanted port has two main parts:  Reservoir. The reservoir is the part where a needle  is inserted to give medicines or draw blood. The reservoir is round. After it is placed, it appears as a small, raised area under your skin.  Catheter. The catheter is a thin, flexible tube that connects the reservoir to a vein. Medicine that is inserted into the reservoir goes into the catheter and then into the vein. How is my port accessed? To access your port:  A numbing cream may be placed on the skin over the port site.  Your health care provider will put on a mask and sterile gloves.  The skin over your port will be cleaned carefully with a germ-killing soap and allowed to dry.  Your health care provider will gently pinch the port and insert a needle into it.  Your health care provider will check for a blood return to make sure the port is in the vein and is not clogged.  If your port needs to remain accessed to get medicine continuously (constant infusion), your health care provider will place a clear bandage (dressing) over the needle site. The dressing and needle will need to be changed every week, or as told by your health care provider. What is flushing? Flushing helps keep the port from getting clogged. Follow instructions from your health care provider about how and when to flush the port. Ports are usually flushed with saline solution or a medicine called heparin. The need for flushing will depend on how the port is used:  If the port is only used from time to time to give medicines or draw blood, the port may need to be flushed: ? Before and after medicines have been given. ? Before and after blood has been drawn. ? As part of  routine maintenance. Flushing may be recommended every 4-6 weeks.  If a constant infusion is running, the port may not need to be flushed.  Throw away any syringes in a disposal container that is meant for sharp items (sharps container). You can buy a sharps container from a pharmacy, or you can make one by using an empty hard plastic bottle with a cover. How long will my port stay implanted? The port can stay in for as long as your health care provider thinks it is needed. When it is time for the port to come out, a surgery will be done to remove it. The surgery will be similar to the procedure that was done to put the port in. Follow these instructions at home:   Flush your port as told by your health care provider.  If you need an infusion over several days, follow instructions from your health care provider about how to take care of your port site. Make sure you: ? Wash your hands with soap and water before you change your dressing. If soap and water are not available, use alcohol-based hand sanitizer. ? Change your dressing as told by your health care provider. ? Place any used dressings or infusion bags into a plastic bag. Throw that bag in the trash. ? Keep the dressing that covers the needle clean and dry. Do not get it wet. ? Do not use scissors or sharp objects near the tube. ? Keep the tube clamped, unless it is being used.  Check your port site every day for signs of infection. Check for: ? Redness, swelling, or pain. ? Fluid or blood. ? Pus or a bad smell.  Protect the skin around the port site. ? Avoid wearing bra straps that rub or irritate the site. ?  Protect the skin around your port from seat belts. Place a soft pad over your chest if needed.  Bathe or shower as told by your health care provider. The site may get wet as long as you are not actively receiving an infusion.  Return to your normal activities as told by your health care provider. Ask your health care  provider what activities are safe for you.  Carry a medical alert card or wear a medical alert bracelet at all times. This will let health care providers know that you have an implanted port in case of an emergency. Get help right away if:  You have redness, swelling, or pain at the port site.  You have fluid or blood coming from your port site.  You have pus or a bad smell coming from the port site.  You have a fever. Summary  Implanted ports are usually placed in the chest for long-term IV access.  Follow instructions from your health care provider about flushing the port and changing bandages (dressings).  Take care of the area around your port by avoiding clothing that puts pressure on the area, and by watching for signs of infection.  Protect the skin around your port from seat belts. Place a soft pad over your chest if needed.  Get help right away if you have a fever or you have redness, swelling, pain, drainage, or a bad smell at the port site. This information is not intended to replace advice given to you by your health care provider. Make sure you discuss any questions you have with your health care provider. Document Revised: 05/10/2018 Document Reviewed: 02/19/2016 Elsevier Patient Education  2020 [place] After These instructions provide you with information about caring for yourself after your procedure. Your health care provider may also give you more specific instructions. Your treatment has been planned according to current medical practices, but problems sometimes occur. Call your health care provider if you have any problems or questions after your procedure. What can I expect after the procedure? After your procedure, you may:  Feel sleepy for several hours.  Feel clumsy and have poor balance for several hours.  Feel forgetful about what happened after the procedure.  Have poor judgment for several hours.  Feel  nauseous or vomit.  Have a sore throat if you had a breathing tube during the procedure. Follow these instructions at home: For at least 24 hours after the procedure:      Have a responsible adult stay with you. It is important to have someone help care for you until you are awake and alert.  Rest as needed.  Do not: ? Participate in activities in which you could fall or become injured. ? Drive. ? Use heavy machinery. ? Drink alcohol. ? Take sleeping pills or medicines that cause drowsiness. ? Make important decisions or sign legal documents. ? Take care of children on your own. Eating and drinking  Follow the diet that is recommended by your health care provider.  If you vomit, drink water, juice, or soup when you can drink without vomiting.  Make sure you have little or no nausea before eating solid foods. General instructions  Take over-the-counter and prescription medicines only as told by your health care provider.  If you have sleep apnea, surgery and certain medicines can increase your risk for breathing problems. Follow instructions from your health care provider about wearing your sleep device: ? Anytime you are sleeping, including during  daytime naps. ? While taking prescription pain medicines, sleeping medicines, or medicines that make you drowsy.  If you smoke, do not smoke without supervision.  Keep all follow-up visits as told by your health care provider. This is important. Contact a health care provider if:  You keep feeling nauseous or you keep vomiting.  You feel light-headed.  You develop a rash.  You have a fever. Get help right away if:  You have trouble breathing. Summary  For several hours after your procedure, you may feel sleepy and have poor judgment.  Have a responsible adult stay with you for at least 24 hours or until you are awake and alert. This information is not intended to replace advice given to you by your health care  provider. Make sure you discuss any questions you have with your health care provider. Document Revised: 04/16/2017 Document Reviewed: 05/09/2015 Elsevier Patient Education  [place]. How to Use Chlorhexidine for Bathing Chlorhexidine gluconate (CHG) is a germ-killing (antiseptic) solution that is used to clean the skin. It can get rid of the bacteria that normally live on the skin and can keep them away for about 24 hours. To clean your skin with CHG, you may be given:  A CHG solution to use in the shower or as part of a sponge bath.  A prepackaged cloth that contains CHG. Cleaning your skin with CHG may help lower the risk for infection:  While you are staying in the intensive care unit of the hospital.  If you have a vascular access, such as a central line, to provide short-term or long-term access to your veins.  If you have a catheter to drain urine from your bladder.  If you are on a ventilator. A ventilator is a machine that helps you breathe by moving air in and out of your lungs.  After surgery. What are the risks? Risks of using CHG include:  A skin reaction.  Hearing loss, if CHG gets in your ears.  Eye injury, if CHG gets in your eyes and is not rinsed out.  The CHG product catching fire. Make sure that you avoid smoking and flames after applying CHG to your skin. Do not use CHG:  If you have a chlorhexidine allergy or have previously reacted to chlorhexidine.  On babies younger than 41 months of age. How to use CHG solution  Use CHG only as told by your health care provider, and follow the instructions on the label.  Use the full amount of CHG as directed. Usually, this is one bottle. During a shower Follow these steps when using CHG solution during a shower (unless your health care provider gives you different instructions): 1. Start the shower. 2. Use your normal soap and shampoo to wash your face and hair. 3. Turn off the shower or move out of the  shower stream. 4. Pour the CHG onto a clean washcloth. Do not use any type of brush or rough-edged sponge. 5. Starting at your neck, lather your body down to your toes. Make sure you follow these instructions: ? If you will be having surgery, pay special attention to the part of your body where you will be having surgery. Scrub this area for at least 1 minute. ? Do not use CHG on your head or face. If the solution gets into your ears or eyes, rinse them well with water. ? Avoid your genital area. ? Avoid any areas of skin that have broken skin, cuts, or scrapes. ?  Scrub your back and under your arms. Make sure to wash skin folds. 6. Let the lather sit on your skin for 1-2 minutes or as long as told by your health care provider. 7. Thoroughly rinse your entire body in the shower. Make sure that all body creases and crevices are rinsed well. 8. Dry off with a clean towel. Do not put any substances on your body afterward--such as powder, lotion, or perfume--unless you are told to do so by your health care provider. Only use lotions that are recommended by the manufacturer. 9. Put on clean clothes or pajamas. 10. If it is the night before your surgery, sleep in clean sheets.  During a sponge bath Follow these steps when using CHG solution during a sponge bath (unless your health care provider gives you different instructions): 1. Use your normal soap and shampoo to wash your face and hair. 2. Pour the CHG onto a clean washcloth. 3. Starting at your neck, lather your body down to your toes. Make sure you follow these instructions: ? If you will be having surgery, pay special attention to the part of your body where you will be having surgery. Scrub this area for at least 1 minute. ? Do not use CHG on your head or face. If the solution gets into your ears or eyes, rinse them well with water. ? Avoid your genital area. ? Avoid any areas of skin that have broken skin, cuts, or scrapes. ? Scrub your  back and under your arms. Make sure to wash skin folds. 4. Let the lather sit on your skin for 1-2 minutes or as long as told by your health care provider. 5. Using a different clean, wet washcloth, thoroughly rinse your entire body. Make sure that all body creases and crevices are rinsed well. 6. Dry off with a clean towel. Do not put any substances on your body afterward--such as powder, lotion, or perfume--unless you are told to do so by your health care provider. Only use lotions that are recommended by the manufacturer. 7. Put on clean clothes or pajamas. 8. If it is the night before your surgery, sleep in clean sheets. How to use CHG prepackaged cloths  Only use CHG cloths as told by your health care provider, and follow the instructions on the label.  Use the CHG cloth on clean, dry skin.  Do not use the CHG cloth on your head or face unless your health care provider tells you to.  When washing with the CHG cloth: ? Avoid your genital area. ? Avoid any areas of skin that have broken skin, cuts, or scrapes. Before surgery Follow these steps when using a CHG cloth to clean before surgery (unless your health care provider gives you different instructions): 1. Using the CHG cloth, vigorously scrub the part of your body where you will be having surgery. Scrub using a back-and-forth motion for 3 minutes. The area on your body should be completely wet with CHG when you are done scrubbing. 2. Do not rinse. Discard the cloth and let the area air-dry. Do not put any substances on the area afterward, such as powder, lotion, or perfume. 3. Put on clean clothes or pajamas. 4. If it is the night before your surgery, sleep in clean sheets.  For general bathing Follow these steps when using CHG cloths for general bathing (unless your health care provider gives you different instructions). 1. Use a separate CHG cloth for each area of your body. Make sure you  wash between any folds of skin and between  your fingers and toes. Wash your body in the following order, switching to a new cloth after each step: ? The front of your neck, shoulders, and chest. ? Both of your arms, under your arms, and your hands. ? Your stomach and groin area, avoiding the genitals. ? Your right leg and foot. ? Your left leg and foot. ? The back of your neck, your back, and your buttocks. 2. Do not rinse. Discard the cloth and let the area air-dry. Do not put any substances on your body afterward--such as powder, lotion, or perfume--unless you are told to do so by your health care provider. Only use lotions that are recommended by the manufacturer. 3. Put on clean clothes or pajamas. Contact a health care provider if:  Your skin gets irritated after scrubbing.  You have questions about using your solution or cloth. Get help right away if:  Your eyes become very red or swollen.  Your eyes itch badly.  Your skin itches badly and is red or swollen.  Your hearing changes.  You have trouble seeing.  You have swelling or tingling in your mouth or throat.  You have trouble breathing.  You swallow any chlorhexidine. Summary  Chlorhexidine gluconate (CHG) is a germ-killing (antiseptic) solution that is used to clean the skin. Cleaning your skin with CHG may help to lower your risk for infection.  You may be given CHG to use for bathing. It may be in a bottle or in a prepackaged cloth to use on your skin. Carefully follow your health care provider's instructions and the instructions on the product label.  Do not use CHG if you have a chlorhexidine allergy.  Contact your health care provider if your skin gets irritated after scrubbing. This information is not intended to replace advice given to you by your health care provider. Make sure you discuss any questions you have with your health care provider. Document Revised: 04/04/2018 Document Reviewed: 12/14/2016 Elsevier Patient Education  [place].

## 2019-09-05 ENCOUNTER — Other Ambulatory Visit: Payer: Self-pay

## 2019-09-05 ENCOUNTER — Emergency Department (HOSPITAL_COMMUNITY): Payer: Medicaid Other

## 2019-09-05 ENCOUNTER — Other Ambulatory Visit (HOSPITAL_COMMUNITY)
Admission: RE | Admit: 2019-09-05 | Discharge: 2019-09-05 | Disposition: A | Discharge status: Home / Self Care | Payer: Medicaid Other | Source: Ambulatory Visit | Attending: General Surgery | Admitting: General Surgery

## 2019-09-05 ENCOUNTER — Encounter (HOSPITAL_COMMUNITY): Payer: Self-pay

## 2019-09-05 ENCOUNTER — Emergency Department (HOSPITAL_COMMUNITY)
Admission: EM | Admit: 2019-09-05 | Discharge: 2019-09-05 | Disposition: A | Discharge status: Home / Self Care | Payer: Medicaid Other | Attending: Emergency Medicine | Admitting: Emergency Medicine

## 2019-09-05 DIAGNOSIS — Z20822 Contact with and (suspected) exposure to covid-19: Secondary | ICD-10-CM | POA: Insufficient documentation

## 2019-09-05 DIAGNOSIS — M549 Dorsalgia, unspecified: Secondary | ICD-10-CM | POA: Diagnosis present

## 2019-09-05 DIAGNOSIS — J449 Chronic obstructive pulmonary disease, unspecified: Secondary | ICD-10-CM | POA: Insufficient documentation

## 2019-09-05 DIAGNOSIS — Z87891 Personal history of nicotine dependence: Secondary | ICD-10-CM | POA: Insufficient documentation

## 2019-09-05 DIAGNOSIS — M5441 Lumbago with sciatica, right side: Secondary | ICD-10-CM | POA: Diagnosis not present

## 2019-09-05 DIAGNOSIS — M545 Low back pain, unspecified: Secondary | ICD-10-CM

## 2019-09-05 DIAGNOSIS — R109 Unspecified abdominal pain: Secondary | ICD-10-CM | POA: Insufficient documentation

## 2019-09-05 DIAGNOSIS — I10 Essential (primary) hypertension: Secondary | ICD-10-CM | POA: Diagnosis not present

## 2019-09-05 DIAGNOSIS — Z01812 Encounter for preprocedural laboratory examination: Secondary | ICD-10-CM | POA: Insufficient documentation

## 2019-09-05 LAB — URINALYSIS, ROUTINE W REFLEX MICROSCOPIC
Bilirubin Urine: NEGATIVE
Glucose, UA: NEGATIVE mg/dL
Hgb urine dipstick: NEGATIVE
Ketones, ur: NEGATIVE mg/dL
Leukocytes,Ua: NEGATIVE
Nitrite: NEGATIVE
Protein, ur: NEGATIVE mg/dL
Specific Gravity, Urine: 1.004 — ABNORMAL LOW (ref 1.005–1.030)
pH: 7 (ref 5.0–8.0)

## 2019-09-05 MED ORDER — TRAMADOL HCL 50 MG PO TABS
50.0000 mg | ORAL_TABLET | Freq: Once | ORAL | Status: AC
Start: 2019-09-05 — End: 2019-09-05
  Administered 2019-09-05: 50 mg via ORAL
  Filled 2019-09-05: qty 1

## 2019-09-05 MED ORDER — TRAMADOL HCL 50 MG PO TABS: 50.0000 mg | ORAL_TABLET | Freq: Four times a day (QID) | ORAL | 0 refills | Status: DC | PRN

## 2019-09-05 MED ORDER — LIDOCAINE 5 % EX PTCH: 1.0000 | MEDICATED_PATCH | CUTANEOUS | 0 refills | Status: DC

## 2019-09-05 NOTE — ED Triage Notes (Signed)
Pt is having right flank pain that began 1 week ago. Has been taking BC powders due to pain. She is getting a port in on Monday due to stage 3 lung cancer. Has had a part of right lung removed. She denies any urinary symptoms.

## 2019-09-05 NOTE — Discharge Instructions (Addendum)
Apply the pain patches as directed to your right lower back.  Call your primary doctor on Monday to arrange a follow-up appointment.

## 2019-09-06 LAB — SARS CORONAVIRUS 2 (TAT 6-24 HRS): SARS Coronavirus 2: NEGATIVE

## 2019-09-06 NOTE — ED Provider Notes (Signed)
[facility] EMERGENCY DEPARTMENT Provider Note   CSN: [id number] Arrival date & time: 09/05/19  7741     History Chief Complaint  Patient presents with  . Flank Pain    Rachel Gross is a 54 y.o. female.  HPI      Rachel Gross is a 54 y.o. female with history of COPD, right lung cancer with previous lobectomy in May 2878, and alcoholic hepatitis who presents to the Emergency Department complaining of pain to her right lower flank/back.  Symptoms present for one week. She describes aching pain that is localized to right lower back.  Pain associated with movement.  She has been taking BC powders that provide some relief, but had to stop taking them several days ago due to a scheduled procedure on Monday to have a port placed to begin chemotherapy.   She denies fever, chills, poor wound healing, abdominal pain, dysuria, chest pain or shortness of breath  Past Medical History:  Diagnosis Date  . Alcoholic hepatitis without ascites   . Anxiety   . Arthritis    knees, hands  . 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 Bipolar disorder ([place])   . Cancer ([place])    right lung  . COPD (chronic obstructive pulmonary disease) ([place])   . Depression   . Dyspnea   . Emphysema lung ([place])   . GERD (gastroesophageal reflux disease)   . Hepatitis C    s/p treatment with Epclusa  . History of HPV infection   . History of pneumonia   . Pneumonia   . Smoker     Patient Active Problem List   Diagnosis Date Noted  . Adenocarcinoma of lung, stage 3, right ([place]) 07/02/2019  . S/P lobectomy of lung 06/19/2019  . Nodule of lower lobe of right lung 05/15/2019  . Positive colorectal cancer screening using Cologuard test 12/05/2018  . Hepatic cirrhosis ([place]) 11/16/2017  . Thrombocytopenia ([place]) 09/10/2017  . Hepatitis C antibody test positive 09/10/2017  . Abnormal abdominal ultrasound 09/10/2017  . Hypernatremia   . Essential hypertension   . Acute encephalopathy  08/06/2017  . Hyperammonemia ([place]) 08/06/2017  . Dehydration 08/06/2017  . COPD (chronic obstructive pulmonary disease) ([place]) 08/06/2017  . Transaminitis 08/06/2017  . Idiopathic thrombocytopenic purpura (ITP) ([facility]) 06/13/2017    Past Surgical History:  Procedure Laterality Date  . BIOPSY  05/02/2018   Procedure: BIOPSY;  Surgeon: Daneil Dolin, MD;  [facility];  Service: Endoscopy;;  gastric  . CESAREAN SECTION    . CHEST TUBE INSERTION Right 06/26/2019   Procedure: Right CHEST TUBE REPLACEMENT;  Surgeon: [name], MD;  Location: [place];  Service: Thoracic;  Laterality: Right;  . COLONOSCOPY WITH PROPOFOL N/A 03/03/2019   Procedure: COLONOSCOPY WITH PROPOFOL;  Surgeon: Daneil Dolin, MD;  [facility];  Service: Endoscopy;  Laterality: N/A;  9:15am  . ESOPHAGOGASTRODUODENOSCOPY (EGD) WITH PROPOFOL N/A 05/02/2018   Procedure: ESOPHAGOGASTRODUODENOSCOPY (EGD) WITH PROPOFOL;  Surgeon: Daneil Dolin, MD;  [facility];  Service: Endoscopy;  Laterality: N/A;  8:30am  . EYE SURGERY Bilateral    cataract  . HEMOSTASIS CLIP PLACEMENT  03/03/2019   Procedure: HEMOSTASIS CLIP PLACEMENT;  Surgeon: Daneil Dolin, MD;  [facility];  Service: Endoscopy;;  . INTERCOSTAL NERVE BLOCK Right 06/19/2019   Procedure: Intercostal Nerve Block;  Surgeon: [name], MD;  Location: [place];  Service: Thoracic;  Laterality: Right;  . IR US  GUIDE VASC ACCESS RIGHT  03/13/2018  . LOBECTOMY    . LYMPH NODE DISSECTION Right 06/19/2019   Procedure: Lymph Node Dissection;  Surgeon: [name], MD;  Location: [place];  Service: Thoracic;  Laterality: Right;  . POLYPECTOMY  03/03/2019   Procedure: POLYPECTOMY;  Surgeon: Daneil Dolin, MD;  [facility];  Service: Endoscopy;;  . TONSILLECTOMY    . TOOTH EXTRACTION  01/31/2018   x 7  . VIDEO BRONCHOSCOPY WITH INSERTION OF INTERBRONCHIAL VALVE (IBV) N/A 06/26/2019   Procedure: VIDEO  BRONCHOSCOPY WITH INSERTION OF TWO INTERBRONCHIAL VALVE (IBV);  Surgeon: [name], MD;  Location: [facility] OR;  Service: Thoracic;  Laterality: N/A;  . VIDEO BRONCHOSCOPY WITH INSERTION OF INTERBRONCHIAL VALVE (IBV) N/A 08/08/2019   Procedure: VIDEO BRONCHOSCOPY WITH REMOVAL OF INTERBRONCHIAL VALVE (IBV);  Surgeon: [name], MD;  Location: [facility] OR;  Service: Thoracic;  Laterality: N/A;     OB History   No obstetric history on file.     Family History  Problem Relation Age of Onset  . Congenital heart disease Mother   . Bipolar disorder Mother   . Prostate cancer Brother   . Alzheimer's disease Paternal Grandmother   . Colon cancer Neg Hx     Social History   Tobacco Use  . Smoking status: Former Smoker    Packs/day: 0.50    Years: 38.00    Pack years: 19.00    Types: Cigarettes    Quit date: 06/19/2019    Years since quitting: 0.2  . Smokeless tobacco: Never Used  Vaping Use  . Vaping Use: Never used  Substance Use Topics  . Alcohol use: Not Currently    Comment: Last use of alcohol 03/2016  . 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 Authorizing Provider  albuterol (VENTOLIN HFA) 108 (90 Base) MCG/ACT inhaler Inhale 2 puffs into the lungs every 4 (four) hours as needed for wheezing or shortness of breath.  01/17/19   [provider]  Cyanocobalamin 1000 MCG/ML KIT Inject 1,000 mcg as directed See admin instructions. Every 9 weeks    [provider]  diphenhydrAMINE (BENADRYL) 25 MG tablet Take 25 mg by mouth at bedtime.     [provider]  divalproex (DEPAKOTE) 250 MG DR tablet Take 250 mg by mouth at bedtime.     [provider]  lidocaine (LIDODERM) 5 % Place 1 patch onto the skin daily. Remove & Discard patch within 12 hours or as directed by MD 09/05/19   Kem Parkinson, PA-C  lidocaine-prilocaine  (EMLA) cream Apply a small amount to port a cath site and cover with plastic wrap 1 hour prior to chemotherapy appointments 08/27/19   Derek Jack, MD  ondansetron (ZOFRAN ODT) 8 MG disintegrating tablet Place 1 tablet under tongue every 8 hours as needed for nausea. You may begin using this medication on the third day after each chemotherapy treatment. 08/27/19   Derek Jack, MD  pantoprazole (PROTONIX) 40 MG tablet Take one tablet before breakfast daily. Patient taking differently: Take 40 mg by mouth daily before breakfast.  08/29/18   Mahala Menghini, PA-C  risperiDONE (RISPERDAL) 2 MG tablet Take 2 mg by mouth at bedtime.     [provider]  scopolamine (TRANSDERM-SCOP) 1 MG/3DAYS Place 1 patch (1.5 mg total) onto the skin every  3 (three) days. 08/27/19   Derek Jack, MD  SYMBICORT 160-4.5 MCG/ACT inhaler Inhale 2 puffs into the lungs 2 (two) times daily. 01/17/19   [provider]  traMADol (ULTRAM) 50 MG tablet Take 1 tablet (50 mg total) by mouth every 6 (six) hours as needed. 09/05/19   Aeriel Boulay, PA-C    Allergies    Folic acid, Penicillins, Amoxicillin, Fish allergy, and Other  Review of Systems   Review of Systems  Constitutional: Negative for fever.  Respiratory: Negative for chest tightness and shortness of breath.   Cardiovascular: Negative for chest pain.  Gastrointestinal: Negative for abdominal pain, constipation and vomiting.  Genitourinary: Negative for decreased urine volume, difficulty urinating, dysuria, flank pain and hematuria.  Musculoskeletal: Positive for back pain. Negative for joint swelling.  Skin: Negative for rash.  Neurological: Negative for weakness and numbness.    Physical Exam Updated Vital Signs BP 119/84 (BP Location: Right Arm)   Pulse 90   Temp 98.6 F (37 C)   Resp 16   Ht _0  (1.549 m)   Wt 73.9 kg   SpO2 97%   BMI 30.80 kg/m   Physical Exam Vitals and nursing note reviewed.    Constitutional:      General: She is not in acute distress.    Appearance: Normal appearance. She is well-developed.  HENT:     Head: Atraumatic.     Mouth/Throat:     Mouth: Mucous membranes are moist.  Eyes:     Conjunctiva/sclera: Conjunctivae normal.  Cardiovascular:     Rate and Rhythm: Normal rate and regular rhythm.     Comments: DP pulses are strong and palpable bilaterally Pulmonary:     Effort: Pulmonary effort is normal. No respiratory distress.     Breath sounds: Normal breath sounds.     Comments: Well healing incisions to right lateral chest wall.  Appears to be healing well.  No dehiscence, drainage, edema or surrounding erythema.  Abdominal:     General: There is no distension.     Palpations: Abdomen is soft.     Tenderness: There is no abdominal tenderness. There is no right CVA tenderness or left CVA tenderness.  Musculoskeletal:        General: Tenderness present. No swelling.     Cervical back: Normal range of motion and neck supple.     Lumbar back: Tenderness present. No swelling, deformity or lacerations. Normal range of motion.     Comments: Focal ttp of the right lower lumbar paraspinal muscles. No spinal tenderness.    Skin:    General: Skin is warm and dry.     Capillary Refill: Capillary refill takes less than 2 seconds.     Findings: No rash.  Neurological:     General: No focal deficit present.     Mental Status: She is alert and oriented to person, place, and time.     Sensory: No sensory deficit.     Motor: No abnormal muscle tone.     Coordination: Coordination normal.     Gait: Gait normal.     Deep Tendon Reflexes:     Reflex Scores:      Patellar reflexes are 2+ on the right side and 2+ on the left side.      Achilles reflexes are 2+ on the right side and 2+ on the left side.    ED Results / Procedures / Treatments   Labs (all labs ordered are listed, but only abnormal results  are displayed) Labs Reviewed  URINALYSIS, ROUTINE W  REFLEX MICROSCOPIC - Abnormal; Notable for the following components:      Result Value   Color, Urine STRAW (*)    Specific Gravity, Urine 1.004 (*)    All other components within normal limits    EKG None  Radiology DG Lumbar Spine Complete  Result Date: 09/05/2019 CLINICAL DATA:  Right flank pain for 1 week. History of lung cancer. EXAM: LUMBAR SPINE - COMPLETE 4+ VIEW COMPARISON:  PET-CT 05/13/2019. FINDINGS: There are 5 non rib-bearing lumbar type vertebrae. Vertebral alignment is normal. No fracture is identified. There is mild disc space narrowing from L3-4 to L5-S1. Small calcifications in the pelvis likely represent phleboliths. IMPRESSION: Mild lumbar disc degeneration.  No acute osseous abnormality. Electronically Signed   By: Logan Bores M.D.   On: 09/05/2019 11:06   CT Renal Stone Study  Result Date: 09/05/2019 CLINICAL DATA:  RIGHT flank pain EXAM: CT ABDOMEN AND PELVIS WITHOUT CONTRAST TECHNIQUE: Multidetector CT imaging of the abdomen and pelvis was performed following the standard protocol without IV contrast. COMPARISON:  March 13, 2018, May 01, 2019. FINDINGS: Lower chest: There is a small to moderate loculated RIGHT pleural effusion with a heterogeneous density likely reflecting postsurgical changes/debris. RIGHT lower lung atelectasis. Coronary artery atherosclerotic calcifications. No significant pericardial effusion. Hepatobiliary: Nodular contour of the liver consistent with underlying cirrhosis. Gallbladder is unremarkable. No intrahepatic or extrahepatic biliary ductal dilation Pancreas: Unremarkable. No pancreatic ductal dilatation or surrounding inflammatory changes. Spleen: Spleen is the upper limits of normal in size. Adrenals/Urinary Tract: Adrenals are unremarkable. Nonobstructive LEFT-sided nephrolithiasis. No hydronephrosis. Bladder is unremarkable. Stomach/Bowel: Stomach is within normal limits. Appendix appears normal. No evidence of bowel wall thickening,  distention, or inflammatory changes. Diverticulosis. Revisualization of duodenal lipoma, unchanged. Vascular/Lymphatic: Moderate to severe atherosclerotic calcifications of the aorta. Reproductive: Fibroid uterus.  No suspicious adnexal masses. Other: No ascites. Musculoskeletal: Unchanged nondisplaced RIGHT-sided rib fracture of the RIGHT lateral seventh rib. Unchanged nondisplaced fracture of the RIGHT lateral eighth rib. Remote rib fractures of the lateral RIGHT sixth and fifth ribs. IMPRESSION: 1. No acute intra-abdominal findings to explain the patient's right flank pain. 2. Small to moderate loculated RIGHT pleural effusion with internal mildly heterogeneous density likely reflecting postsurgical changes/debris. There may be a sub pulmonic component. 3. Cirrhosis. 4. Nonobstructive LEFT-sided nephrolithiasis. Aortic Atherosclerosis (ICD10-I70.0). Electronically Signed   By: Valentino Saxon MD   On: 09/05/2019 12:28    Procedures Procedures (including critical care time)  Medications Ordered in ED Medications  traMADol (ULTRAM) tablet 50 mg (50 mg Oral Given 09/05/19 1047)    ED Course  I have reviewed the triage vital signs and the nursing notes.  Pertinent labs & imaging results that were available during my care of the patient were reviewed by me and considered in my medical decision making (see chart for details).    MDM Rules/Calculators/A&P                          Pt with focal right lower back pain.  NV intact.  Ambulates with steady gait.  No CVA tenderness and prior surgical site appears to be healing well and w/o significant tenderness  U/a, L spine and CT renal stone study are w/o acute changes. Felt to be musculoskeletal.  No concerning sx's for wound infection, cauda equina, kidney stone or pyelonephritis.  Appears appropriate for d/c home, close f/u with PCP,  Return precautions discussed.  Final Clinical Impression(s) / ED Diagnoses Final diagnoses:  Acute  right-sided low back pain without sciatica    Rx / DC Orders ED Discharge Orders         Ordered    traMADol (ULTRAM) 50 MG tablet  Every 6 hours PRN     Discontinue  Reprint     09/05/19 1235    lidocaine (LIDODERM) 5 %  Every 24 hours     Discontinue  Reprint     09/05/19 [place], PA-C 09/06/19 2237    Margette Fast, MD 09/07/19 [phone]

## 2019-09-08 ENCOUNTER — Other Ambulatory Visit: Payer: Self-pay

## 2019-09-08 ENCOUNTER — Other Ambulatory Visit (HOSPITAL_COMMUNITY): Payer: Self-pay

## 2019-09-08 ENCOUNTER — Encounter (HOSPITAL_COMMUNITY)
Admission: RE | Disposition: A | Discharge status: Home / Self Care | Payer: Self-pay | Source: Home / Self Care | Attending: General Surgery

## 2019-09-08 ENCOUNTER — Encounter (HOSPITAL_COMMUNITY): Payer: Self-pay | Admitting: General Surgery

## 2019-09-08 ENCOUNTER — Ambulatory Visit (HOSPITAL_COMMUNITY)
Admission: RE | Admit: 2019-09-08 | Discharge: 2019-09-08 | Disposition: A | Discharge status: Home / Self Care | Payer: Medicaid Other | Attending: General Surgery | Admitting: General Surgery

## 2019-09-08 ENCOUNTER — Ambulatory Visit (HOSPITAL_COMMUNITY): Payer: Medicaid Other | Admitting: Certified Registered Nurse Anesthetist

## 2019-09-08 ENCOUNTER — Ambulatory Visit (HOSPITAL_COMMUNITY): Payer: Medicaid Other

## 2019-09-08 DIAGNOSIS — F329 Major depressive disorder, single episode, unspecified: Secondary | ICD-10-CM | POA: Insufficient documentation

## 2019-09-08 DIAGNOSIS — Z79899 Other long term (current) drug therapy: Secondary | ICD-10-CM | POA: Diagnosis not present

## 2019-09-08 DIAGNOSIS — F319 Bipolar disorder, unspecified: Secondary | ICD-10-CM | POA: Insufficient documentation

## 2019-09-08 DIAGNOSIS — Z880 Allergy status to penicillin: Secondary | ICD-10-CM | POA: Insufficient documentation

## 2019-09-08 DIAGNOSIS — Z87891 Personal history of nicotine dependence: Secondary | ICD-10-CM | POA: Insufficient documentation

## 2019-09-08 DIAGNOSIS — Z7951 Long term (current) use of inhaled steroids: Secondary | ICD-10-CM | POA: Diagnosis not present

## 2019-09-08 DIAGNOSIS — J449 Chronic obstructive pulmonary disease, unspecified: Secondary | ICD-10-CM | POA: Diagnosis not present

## 2019-09-08 DIAGNOSIS — K219 Gastro-esophageal reflux disease without esophagitis: Secondary | ICD-10-CM | POA: Diagnosis not present

## 2019-09-08 DIAGNOSIS — C3431 Malignant neoplasm of lower lobe, right bronchus or lung: Secondary | ICD-10-CM

## 2019-09-08 DIAGNOSIS — C3491 Malignant neoplasm of unspecified part of right bronchus or lung: Secondary | ICD-10-CM

## 2019-09-08 DIAGNOSIS — Z95828 Presence of other vascular implants and grafts: Secondary | ICD-10-CM

## 2019-09-08 HISTORY — PX: PORTACATH PLACEMENT: SHX2246

## 2019-09-08 SURGERY — INSERTION, TUNNELED CENTRAL VENOUS DEVICE, WITH PORT
Anesthesia: General | Site: Chest | Laterality: Left

## 2019-09-08 MED ORDER — SODIUM CHLORIDE (PF) 0.9 % IJ SOLN
INTRAMUSCULAR | Status: DC | PRN
  Administered 2019-09-08: 500 mL

## 2019-09-08 MED ORDER — ONDANSETRON HCL 4 MG/2ML IJ SOLN
INTRAMUSCULAR | Status: DC | PRN
  Administered 2019-09-08: 4 mg via INTRAVENOUS

## 2019-09-08 MED ORDER — CHLORHEXIDINE GLUCONATE CLOTH 2 % EX PADS: 6.0000 | MEDICATED_PAD | Freq: Once | CUTANEOUS | Status: DC

## 2019-09-08 MED ORDER — PROPOFOL 500 MG/50ML IV EMUL
INTRAVENOUS | Status: DC | PRN
  Administered 2019-09-08: 100 ug/kg/min via INTRAVENOUS

## 2019-09-08 MED ORDER — HYDROMORPHONE HCL 1 MG/ML IJ SOLN: 0.2500 mg | INTRAMUSCULAR | Status: DC | PRN

## 2019-09-08 MED ORDER — KETOROLAC TROMETHAMINE 30 MG/ML IJ SOLN
30.0000 mg | Freq: Once | INTRAMUSCULAR | Status: AC
  Administered 2019-09-08: 30 mg via INTRAVENOUS
  Filled 2019-09-08: qty 1

## 2019-09-08 MED ORDER — MIDAZOLAM HCL 2 MG/2ML IJ SOLN
INTRAMUSCULAR | Status: AC
  Filled 2019-09-08: qty 2

## 2019-09-08 MED ORDER — ONDANSETRON HCL 4 MG/2ML IJ SOLN: 4.0000 mg | Freq: Once | INTRAMUSCULAR | Status: DC | PRN

## 2019-09-08 MED ORDER — MEPERIDINE HCL 50 MG/ML IJ SOLN: 6.2500 mg | INTRAMUSCULAR | Status: DC | PRN

## 2019-09-08 MED ORDER — ALBUTEROL SULFATE HFA 108 (90 BASE) MCG/ACT IN AERS
INHALATION_SPRAY | RESPIRATORY_TRACT | Status: AC
  Filled 2019-09-08: qty 6.7

## 2019-09-08 MED ORDER — SODIUM CHLORIDE FLUSH 0.9 % IV SOLN
INTRAVENOUS | Status: AC
  Filled 2019-09-08: qty 20

## 2019-09-08 MED ORDER — CHLORHEXIDINE GLUCONATE 0.12 % MT SOLN
15.0000 mL | Freq: Once | OROMUCOSAL | Status: AC
  Administered 2019-09-08: 15 mL via OROMUCOSAL

## 2019-09-08 MED ORDER — HEPARIN SOD (PORK) LOCK FLUSH 100 UNIT/ML IV SOLN
INTRAVENOUS | Status: DC | PRN
  Administered 2019-09-08: 500 [IU] via INTRAVENOUS

## 2019-09-08 MED ORDER — LACTATED RINGERS IV SOLN: INTRAVENOUS | Status: DC

## 2019-09-08 MED ORDER — LIDOCAINE HCL (PF) 1 % IJ SOLN
INTRAMUSCULAR | Status: AC
  Filled 2019-09-08: qty 30

## 2019-09-08 MED ORDER — HEPARIN SOD (PORK) LOCK FLUSH 100 UNIT/ML IV SOLN
INTRAVENOUS | Status: AC
  Filled 2019-09-08: qty 5

## 2019-09-08 MED ORDER — ORAL CARE MOUTH RINSE: 15.0000 mL | Freq: Once | OROMUCOSAL | Status: AC

## 2019-09-08 MED ORDER — VANCOMYCIN HCL IN DEXTROSE 1-5 GM/200ML-% IV SOLN
1000.0000 mg | INTRAVENOUS | Status: AC
  Administered 2019-09-08: 1000 mg via INTRAVENOUS
  Filled 2019-09-08: qty 200

## 2019-09-08 MED ORDER — MIDAZOLAM HCL 5 MG/5ML IJ SOLN
INTRAMUSCULAR | Status: DC | PRN
  Administered 2019-09-08: 2 mg via INTRAVENOUS

## 2019-09-08 MED ORDER — TRAMADOL HCL 50 MG PO TABS: 50.0000 mg | ORAL_TABLET | Freq: Four times a day (QID) | ORAL | 0 refills | Status: DC | PRN

## 2019-09-08 MED ORDER — PHENYLEPHRINE 40 MCG/ML (10ML) SYRINGE FOR IV PUSH (FOR BLOOD PRESSURE SUPPORT)
PREFILLED_SYRINGE | INTRAVENOUS | Status: AC
  Filled 2019-09-08: qty 10

## 2019-09-08 MED ORDER — FENTANYL CITRATE (PF) 100 MCG/2ML IJ SOLN
INTRAMUSCULAR | Status: AC
  Filled 2019-09-08: qty 2

## 2019-09-08 MED ORDER — SUGAMMADEX SODIUM 500 MG/5ML IV SOLN
INTRAVENOUS | Status: AC
  Filled 2019-09-08: qty 5

## 2019-09-08 MED ORDER — LIDOCAINE HCL (PF) 1 % IJ SOLN
INTRAMUSCULAR | Status: DC | PRN
  Administered 2019-09-08: 8 mL

## 2019-09-08 MED ORDER — FENTANYL CITRATE (PF) 100 MCG/2ML IJ SOLN
INTRAMUSCULAR | Status: DC | PRN
  Administered 2019-09-08 (×2): 50 ug via INTRAVENOUS

## 2019-09-08 MED ORDER — PROPOFOL 10 MG/ML IV BOLUS
INTRAVENOUS | Status: AC
  Filled 2019-09-08: qty 40

## 2019-09-08 SURGICAL SUPPLY — 31 items
ADH SKN CLS APL DERMABOND .7 (GAUZE/BANDAGES/DRESSINGS) ×1
APL PRP STRL LF ISPRP CHG 10.5 (MISCELLANEOUS) ×1
APPLICATOR CHLORAPREP 10.5 ORG (MISCELLANEOUS) ×2 IMPLANT
BAG DECANTER FOR FLEXI CONT (MISCELLANEOUS) ×2 IMPLANT
CLOTH BEACON ORANGE TIMEOUT ST (SAFETY) ×2 IMPLANT
COVER LIGHT HANDLE STERIS (MISCELLANEOUS) ×4 IMPLANT
COVER WAND RF STERILE (DRAPES) ×2 IMPLANT
DECANTER SPIKE VIAL GLASS SM (MISCELLANEOUS) ×2 IMPLANT
DERMABOND ADVANCED (GAUZE/BANDAGES/DRESSINGS) ×1
DERMABOND ADVANCED .7 DNX12 (GAUZE/BANDAGES/DRESSINGS) ×1 IMPLANT
DRAPE C-ARM FOLDED MOBILE STRL (DRAPES) ×2 IMPLANT
ELECT REM PT RETURN 9FT ADLT (ELECTROSURGICAL) ×2
ELECTRODE REM PT RTRN 9FT ADLT (ELECTROSURGICAL) ×1 IMPLANT
GLOVE BIOGEL PI IND STRL 7.0 (GLOVE) ×2 IMPLANT
GLOVE BIOGEL PI INDICATOR 7.0 (GLOVE) ×2
GLOVE ECLIPSE 6.5 STRL STRAW (GLOVE) ×2 IMPLANT
GLOVE SURG SS PI 7.5 STRL IVOR (GLOVE) ×2 IMPLANT
GOWN STRL REUS W/TWL LRG LVL3 (GOWN DISPOSABLE) ×4 IMPLANT
IV NS 500ML (IV SOLUTION) ×2
IV NS 500ML BAXH (IV SOLUTION) ×1 IMPLANT
KIT PORT POWER 8FR ISP MRI (Port) ×2 IMPLANT
KIT TURNOVER KIT A (KITS) ×2 IMPLANT
NEEDLE HYPO 25X1 1.5 SAFETY (NEEDLE) ×2 IMPLANT
PACK MINOR (CUSTOM PROCEDURE TRAY) ×2 IMPLANT
PAD ARMBOARD 7.5X6 YLW CONV (MISCELLANEOUS) ×2 IMPLANT
SET BASIN LINEN APH (SET/KITS/TRAYS/PACK) ×2 IMPLANT
SUT MNCRL AB 4-0 PS2 18 (SUTURE) ×2 IMPLANT
SUT VIC AB 3-0 SH 27 (SUTURE) ×2
SUT VIC AB 3-0 SH 27X BRD (SUTURE) ×1 IMPLANT
SYR 5ML LL (SYRINGE) ×2 IMPLANT
SYR CONTROL 10ML LL (SYRINGE) ×2 IMPLANT

## 2019-09-08 NOTE — Discharge Instructions (Signed)
Implanted Port Home Guide An implanted port is a device that is placed under the skin. It is usually placed in the chest. The device can be used to give IV medicine, to take blood, or for dialysis. You may have an implanted port if:  You need IV medicine that would be irritating to the small veins in your hands or arms.  You need IV medicines, such as antibiotics, for a long period of time.  You need IV nutrition for a long period of time.  You need dialysis. Having a port means that your health care provider will not need to use the veins in your arms for these procedures. You may have fewer limitations when using a port than you would if you used other types of long-term IVs, and you will likely be able to return to normal activities after your incision heals. An implanted port has two main parts:  Reservoir. The reservoir is the part where a needle is inserted to give medicines or draw blood. The reservoir is round. After it is placed, it appears as a small, raised area under your skin.  Catheter. The catheter is a thin, flexible tube that connects the reservoir to a vein. Medicine that is inserted into the reservoir goes into the catheter and then into the vein. How is my port accessed? To access your port:  A numbing cream may be placed on the skin over the port site.  Your health care provider will put on a mask and sterile gloves.  The skin over your port will be cleaned carefully with a germ-killing soap and allowed to dry.  Your health care provider will gently pinch the port and insert a needle into it.  Your health care provider will check for a blood return to make sure the port is in the vein and is not clogged.  If your port needs to remain accessed to get medicine continuously (constant infusion), your health care provider will place a clear bandage (dressing) over the needle site. The dressing and needle will need to be changed every week, or as told by your health care  provider. What is flushing? Flushing helps keep the port from getting clogged. Follow instructions from your health care provider about how and when to flush the port. Ports are usually flushed with saline solution or a medicine called heparin. The need for flushing will depend on how the port is used:  If the port is only used from time to time to give medicines or draw blood, the port may need to be flushed: ? Before and after medicines have been given. ? Before and after blood has been drawn. ? As part of routine maintenance. Flushing may be recommended every 4-6 weeks.  If a constant infusion is running, the port may not need to be flushed.  Throw away any syringes in a disposal container that is meant for sharp items (sharps container). You can buy a sharps container from a pharmacy, or you can make one by using an empty hard plastic bottle with a cover. How long will my port stay implanted? The port can stay in for as long as your health care provider thinks it is needed. When it is time for the port to come out, a surgery will be done to remove it. The surgery will be similar to the procedure that was done to put the port in. Follow these instructions at home:   Flush your port as told by your health care provider.    If you need an infusion over several days, follow instructions from your health care provider about how to take care of your port site. Make sure you: ? Wash your hands with soap and water before you change your dressing. If soap and water are not available, use alcohol-based hand sanitizer. ? Change your dressing as told by your health care provider. ? Place any used dressings or infusion bags into a plastic bag. Throw that bag in the trash. ? Keep the dressing that covers the needle clean and dry. Do not get it wet. ? Do not use scissors or sharp objects near the tube. ? Keep the tube clamped, unless it is being used.  Check your port site every day for signs of  infection. Check for: ? Redness, swelling, or pain. ? Fluid or blood. ? Pus or a bad smell.  Protect the skin around the port site. ? Avoid wearing bra straps that rub or irritate the site. ? Protect the skin around your port from seat belts. Place a soft pad over your chest if needed.  Bathe or shower as told by your health care provider. The site may get wet as long as you are not actively receiving an infusion.  Return to your normal activities as told by your health care provider. Ask your health care provider what activities are safe for you.  Carry a medical alert card or wear a medical alert bracelet at all times. This will let health care providers know that you have an implanted port in case of an emergency. Get help right away if:  You have redness, swelling, or pain at the port site.  You have fluid or blood coming from your port site.  You have pus or a bad smell coming from the port site.  You have a fever. Summary  Implanted ports are usually placed in the chest for long-term IV access.  Follow instructions from your health care provider about flushing the port and changing bandages (dressings).  Take care of the area around your port by avoiding clothing that puts pressure on the area, and by watching for signs of infection.  Protect the skin around your port from seat belts. Place a soft pad over your chest if needed.  Get help right away if you have a fever or you have redness, swelling, pain, drainage, or a bad smell at the port site. This information is not intended to replace advice given to you by your health care provider. Make sure you discuss any questions you have with your health care provider. Document Revised: 05/10/2018 Document Reviewed: 02/19/2016 Elsevier Patient Education  2020 Else[employer] Inc[employer]  Monitored Anesthesia Care, Care After These instructions provide you with information about caring for yourself after your procedure. Your health care  provider may also give you more specific instructions. Your treatment has been planned according to current medical practices, but problems sometimes occur. Call your health care provider if you have any problems or questions after your procedure. What can I expect after the procedure? After your procedure, you may:  Feel sleepy for several hours.  Feel clumsy and have poor balance for several hours.  Feel forgetful about what happened after the procedure.  Have poor judgment for several hours.  Feel nauseous or vomit.  Have a sore throat if you had a breathing tube during the procedure. Follow these instructions at home: For at least 24 hours after the procedure:      Have a responsible adult stay with   you. It is important to have someone help care for you until you are awake and alert.  Rest as needed.  Do not: ? Participate in activities in which you could fall or become injured. ? Drive. ? Use heavy machinery. ? Drink alcohol. ? Take sleeping pills or medicines that cause drowsiness. ? Make important decisions or sign legal documents. ? Take care of children on your own. Eating and drinking  Follow the diet that is recommended by your health care provider.  If you vomit, drink water, juice, or soup when you can drink without vomiting.  Make sure you have little or no nausea before eating solid foods. General instructions  Take over-the-counter and prescription medicines only as told by your health care provider.  If you have sleep apnea, surgery and certain medicines can increase your risk for breathing problems. Follow instructions from your health care provider about wearing your sleep device: ? Anytime you are sleeping, including during daytime naps. ? While taking prescription pain medicines, sleeping medicines, or medicines that make you drowsy.  If you smoke, do not smoke without supervision.  Keep all follow-up visits as told by your health care provider.  This is important. Contact a health care provider if:  You keep feeling nauseous or you keep vomiting.  You feel light-headed.  You develop a rash.  You have a fever. Get help right away if:  You have trouble breathing. Summary  For several hours after your procedure, you may feel sleepy and have poor judgment.  Have a responsible adult stay with you for at least 24 hours or until you are awake and alert. This information is not intended to replace advice given to you by your health care provider. Make sure you discuss any questions you have with your health care provider. Document Revised: 04/16/2017 Document Reviewed: 05/09/2015 Elsevier Patient Education  2020 Elsevier Inc.  

## 2019-09-08 NOTE — Transfer of Care (Signed)
Immediate Anesthesia Transfer of Care Note  Patient: Rachel Gross  Procedure(s) Performed: INSERTION PORT-A-CATH LEFT CHEST (attached catheter in left subclavian) (Left Chest)  Patient Location: PACU  Anesthesia Type:MAC  Level of Consciousness: awake, alert  and oriented  Airway & Oxygen Therapy: Patient Spontanous Breathing and Patient connected to nasal cannula oxygen  Post-op Assessment: Report given to RN and Post -op Vital signs reviewed and stable  Post vital signs: Reviewed and stable  Last Vitals:  Vitals Value Taken Time  BP 112/82 09/08/19 0952  Temp 36.5 C 09/08/19 0952  Pulse 80 09/08/19 0953  Resp 17 09/08/19 0953  SpO2 100 % 09/08/19 0953  Vitals shown include unvalidated device data.  Last Pain:  Vitals:   09/08/19 0726  TempSrc: Oral  PainSc: 7          Complications: No complications documented.

## 2019-09-08 NOTE — Interval H&P Note (Signed)
History and Physical Interval Note:  09/08/2019 7:14 AM  Rachel Gross  has presented today for surgery, with the diagnosis of Right lower lobe lung cancer.  The various methods of treatment have been discussed with the patient and family. After consideration of risks, benefits and other options for treatment, the patient has consented to  Procedure(s): INSERTION PORT-A-CATH (Left) as a surgical intervention.  The patient's history has been reviewed, patient examined, no change in status, stable for surgery.  I have reviewed the patient's chart and labs.  Questions were answered to the patient's satisfaction.     [place] Signs

## 2019-09-08 NOTE — Op Note (Signed)
Patient:  Tyson Masin  DOB:  1965/05/22  MRN:  [id number]   Preop Diagnosis: Right lung carcinoma, stage III  Postop Diagnosis: Same  Procedure: Port-A-Cath insertion  Surgeon: Aviva Signs, MD  Anes: MAC  Indications: Patient is a 54 year old white female who is about to undergo chemotherapy for right lung carcinoma.  The risks and benefits of the procedure including bleeding, infection, and pneumothorax were fully explained to the patient, who gave informed consent.  Procedure note: The patient was placed in the Trendelenburg position after the left upper chest was prepped and draped using the usual sterile technique with ChloraPrep.  Surgical site confirmation was performed.  1% Xylocaine was used for local anesthesia.  An incision was made below the left clavicle.  A subcutaneous pocket was formed.  A needle was advanced into the left subclavian vein using the Seldinger technique without difficulty.  A guidewire was then advanced into the right atrium under fluoroscopic guidance.  An introducer and peel-away sheath were placed over the guidewire.  The catheter was inserted through the peel-away sheath and the peel-away sheath was removed.  The catheter was then attached to the port and the port placed in subcutaneous pocket.  Adequate positioning was confirmed by fluoroscopy.  Good backflow of venous blood was noted on aspiration of the port.  The port was flushed with heparin flush.  The subcutaneous layer was reapproximated using a 3-0 Vicryl interrupted suture.  The skin was closed using a 4-0 Monocryl subcuticular suture.  Dermabond was applied.  All tape needle counts were correct at the end of the procedure.  The patient was transferred to PACU in stable condition.  A chest x-ray will be performed at that time.  Complications: None  EBL: Minimal  Specimen: None

## 2019-09-08 NOTE — Anesthesia Preprocedure Evaluation (Addendum)
Anesthesia Evaluation  Patient identified by MRN, date of birth, ID band Patient awake    Reviewed: Allergy & Precautions, NPO status , Patient's Chart, lab work & pertinent test results  Airway Mallampati: II  TM Distance: >3 FB Neck ROM: Full    Dental  (+) Partial Upper, Missing, Dental Advisory Given,    Pulmonary shortness of breath, pneumonia, COPD, Patient abstained from smoking., former smoker,    Pulmonary exam normal breath sounds clear to auscultation       Cardiovascular hypertension, Normal cardiovascular exam Rhythm:Regular Rate:Normal     Neuro/Psych PSYCHIATRIC DISORDERS Anxiety Depression Bipolar Disorder negative neurological ROS     GI/Hepatic GERD  ,(+)     substance abuse  alcohol use, Hepatitis -, C  Endo/Other  negative endocrine ROS  Renal/GU negative Renal ROS     Musculoskeletal  (+) Arthritis ,   Abdominal   Peds  Hematology negative hematology ROS (+)   Anesthesia Other Findings   Reproductive/Obstetrics negative OB ROS                             Anesthesia Physical  Anesthesia Plan  ASA: III  Anesthesia Plan: General   Post-op Pain Management:    Induction: Intravenous  PONV Risk Score and Plan: 3 and Ondansetron, Treatment may vary due to age or medical condition and Midazolam  Airway Management Planned:   Additional Equipment: None  Intra-op Plan:   Post-operative Plan:   Informed Consent: I have reviewed the patients History and Physical, chart, labs and discussed the procedure including the risks, benefits and alternatives for the proposed anesthesia with the patient or authorized representative who has indicated his/her understanding and acceptance.     Dental advisory given  Plan Discussed with: CRNA  Anesthesia Plan Comments:        Anesthesia Quick Evaluation

## 2019-09-08 NOTE — Anesthesia Postprocedure Evaluation (Signed)
Anesthesia Post Note  Patient: Rachel Gross  Procedure(s) Performed: INSERTION PORT-A-CATH LEFT CHEST (attached catheter in left subclavian) (Left Chest)  Patient location during evaluation: Phase II Anesthesia Type: General Level of consciousness: awake and alert Pain management: satisfactory to patient Vital Signs Assessment: post-procedure vital signs reviewed and stable Respiratory status: spontaneous breathing Cardiovascular status: stable Postop Assessment: no apparent nausea or vomiting Anesthetic complications: no   No complications documented.   Last Vitals:  Vitals:   09/08/19 1000 09/08/19 1022  BP: 111/66 114/77  Pulse: 79 75  Resp: 16 18  Temp:  36.7 C  SpO2: 99% 100%    Last Pain:  Vitals:   09/08/19 1022  TempSrc: Oral  PainSc: 7                  Purcell Jungbluth

## 2019-09-09 ENCOUNTER — Inpatient Hospital Stay (HOSPITAL_COMMUNITY): Payer: Medicaid Other | Attending: Hematology

## 2019-09-09 ENCOUNTER — Inpatient Hospital Stay (HOSPITAL_BASED_OUTPATIENT_CLINIC_OR_DEPARTMENT_OTHER): Payer: Medicaid Other | Admitting: Hematology

## 2019-09-09 ENCOUNTER — Inpatient Hospital Stay (HOSPITAL_COMMUNITY): Payer: Medicaid Other

## 2019-09-09 ENCOUNTER — Encounter (HOSPITAL_COMMUNITY): Payer: Self-pay | Admitting: General Surgery

## 2019-09-09 VITALS — BP 105/59 | HR 98 | Temp 97.9°F | Resp 18

## 2019-09-09 VITALS — BP 103/67 | HR 100 | Temp 97.3°F | Resp 18 | Wt 164.6 lb

## 2019-09-09 DIAGNOSIS — Z452 Encounter for adjustment and management of vascular access device: Secondary | ICD-10-CM | POA: Insufficient documentation

## 2019-09-09 DIAGNOSIS — C3431 Malignant neoplasm of lower lobe, right bronchus or lung: Secondary | ICD-10-CM | POA: Diagnosis present

## 2019-09-09 DIAGNOSIS — Z5111 Encounter for antineoplastic chemotherapy: Secondary | ICD-10-CM | POA: Diagnosis present

## 2019-09-09 DIAGNOSIS — C3491 Malignant neoplasm of unspecified part of right bronchus or lung: Secondary | ICD-10-CM

## 2019-09-09 DIAGNOSIS — K7460 Unspecified cirrhosis of liver: Secondary | ICD-10-CM | POA: Insufficient documentation

## 2019-09-09 DIAGNOSIS — J449 Chronic obstructive pulmonary disease, unspecified: Secondary | ICD-10-CM | POA: Diagnosis not present

## 2019-09-09 DIAGNOSIS — D693 Immune thrombocytopenic purpura: Secondary | ICD-10-CM | POA: Insufficient documentation

## 2019-09-09 DIAGNOSIS — R634 Abnormal weight loss: Secondary | ICD-10-CM | POA: Diagnosis not present

## 2019-09-09 DIAGNOSIS — E876 Hypokalemia: Secondary | ICD-10-CM | POA: Insufficient documentation

## 2019-09-09 LAB — CBC WITH DIFFERENTIAL/PLATELET
Abs Immature Granulocytes: 0.02 10*3/uL (ref 0.00–0.07)
Basophils Absolute: 0 10*3/uL (ref 0.0–0.1)
Basophils Relative: 0 %
Eosinophils Absolute: 0.1 10*3/uL (ref 0.0–0.5)
Eosinophils Relative: 1 %
HCT: 39.3 % (ref 36.0–46.0)
Hemoglobin: 12.5 g/dL (ref 12.0–15.0)
Immature Granulocytes: 0 %
Lymphocytes Relative: 28 %
Lymphs Abs: 1.5 10*3/uL (ref 0.7–4.0)
MCH: 27.3 pg (ref 26.0–34.0)
MCHC: 31.8 g/dL (ref 30.0–36.0)
MCV: 85.8 fL (ref 80.0–100.0)
Monocytes Absolute: 0.4 10*3/uL (ref 0.1–1.0)
Monocytes Relative: 7 %
Neutro Abs: 3.4 10*3/uL (ref 1.7–7.7)
Neutrophils Relative %: 64 %
Platelets: 135 10*3/uL — ABNORMAL LOW (ref 150–400)
RBC: 4.58 MIL/uL (ref 3.87–5.11)
RDW: 14.7 % (ref 11.5–15.5)
WBC: 5.4 10*3/uL (ref 4.0–10.5)
nRBC: 0 % (ref 0.0–0.2)

## 2019-09-09 LAB — COMPREHENSIVE METABOLIC PANEL
ALT: 12 U/L (ref 0–44)
AST: 17 U/L (ref 15–41)
Albumin: 3.8 g/dL (ref 3.5–5.0)
Alkaline Phosphatase: 56 U/L (ref 38–126)
Anion gap: 13 (ref 5–15)
BUN: 7 mg/dL (ref 6–20)
CO2: 21 mmol/L — ABNORMAL LOW (ref 22–32)
Calcium: 8.8 mg/dL — ABNORMAL LOW (ref 8.9–10.3)
Chloride: 99 mmol/L (ref 98–111)
Creatinine, Ser: 0.69 mg/dL (ref 0.44–1.00)
GFR calc Af Amer: >60 mL/min (ref >60)
GFR calc non Af Amer: >60 mL/min (ref >60)
Glucose, Bld: 133 mg/dL — ABNORMAL HIGH (ref 70–99)
Potassium: 3.6 mmol/L (ref 3.5–5.1)
Sodium: 133 mmol/L — ABNORMAL LOW (ref 135–145)
Total Bilirubin: 0.4 mg/dL (ref 0.3–1.2)
Total Protein: 7 g/dL (ref 6.5–8.1)

## 2019-09-09 MED ORDER — SODIUM CHLORIDE 0.9 % IV SOLN
150.0000 mg | Freq: Once | INTRAVENOUS | Status: AC
  Administered 2019-09-09: 150 mg via INTRAVENOUS
  Filled 2019-09-09: qty 150

## 2019-09-09 MED ORDER — SODIUM CHLORIDE 0.9 % IV SOLN
481.2000 mg | Freq: Once | INTRAVENOUS | Status: AC
  Administered 2019-09-09: 480 mg via INTRAVENOUS
  Filled 2019-09-09: qty 48

## 2019-09-09 MED ORDER — SODIUM CHLORIDE 0.9 % IV SOLN: Freq: Once | INTRAVENOUS | Status: AC

## 2019-09-09 MED ORDER — HEPARIN SOD (PORK) LOCK FLUSH 100 UNIT/ML IV SOLN
500.0000 [IU] | Freq: Once | INTRAVENOUS | Status: AC | PRN
  Administered 2019-09-09: 500 [IU]

## 2019-09-09 MED ORDER — SODIUM CHLORIDE 0.9 % IV SOLN
10.0000 mg | Freq: Once | INTRAVENOUS | Status: AC
  Administered 2019-09-09: 10 mg via INTRAVENOUS
  Filled 2019-09-09: qty 10

## 2019-09-09 MED ORDER — PALONOSETRON HCL INJECTION 0.25 MG/5ML
0.2500 mg | Freq: Once | INTRAVENOUS | Status: AC
  Administered 2019-09-09: 0.25 mg via INTRAVENOUS
  Filled 2019-09-09: qty 5

## 2019-09-09 MED ORDER — SODIUM CHLORIDE 0.9% FLUSH
10.0000 mL | INTRAVENOUS | Status: DC | PRN
  Administered 2019-09-09: 10 mL

## 2019-09-09 MED ORDER — SODIUM CHLORIDE 0.9 % IV SOLN
400.0000 mg/m2 | Freq: Once | INTRAVENOUS | Status: AC
  Administered 2019-09-09: 700 mg via INTRAVENOUS
  Filled 2019-09-09: qty 8

## 2019-09-09 NOTE — Patient Instructions (Signed)
[place] at [facility] Discharge Instructions  You were seen today by Dr. Delton Coombes. He went over your recent results. You received your treatment today. Some of the possible side effects of the chemotherapy include fatigue, infections, fevers (check your temperature if you feel feverish and call the office if your temperature exceeds 100.5 degrees Fahrenheit), and constipation. Dr. Delton Coombes will see you back in 1 week for labs, possible fluids and follow up.   Thank you for choosing [place] at [facility] to provide your oncology and hematology care.  To afford each patient quality time with our provider, please arrive at least 15 minutes before your scheduled appointment time.   If you have a lab appointment with the [facility] please come in thru the Main Entrance and check in at the main information desk  You need to re-schedule your appointment should you arrive 10 or more minutes late.  We strive to give you quality time with our providers, and arriving late affects you and other patients whose appointments are after yours.  Also, if you no show three or more times for appointments you may be dismissed from the clinic at the providers discretion.     Again, thank you for choosing [facility].  Our hope is that these requests will decrease the amount of time that you wait before being seen by our physicians.       _____________________________________________________________  Should you have questions after your visit to [facility], please contact our office at [phone] between the hours of 8:00 a.m. and 4:30 p.m.  Voicemails left after 4:00 p.m. will not be returned until the following business day.  For prescription refill requests, have your pharmacy contact our office and allow 72 hours.    Cancer Center Support Programs:   > Cancer Support Group  2nd Tuesday of the month 1pm-2pm, Journey Room

## 2019-09-09 NOTE — Progress Notes (Signed)
[place], [place] [id number]   CLINIC:  Medical Oncology/Hematology  PCP:  [place] [name] [address] [phone]   REASON FOR VISIT:  Follow-up for stage III right lung cancer  PRIOR THERAPY: Right lower lobectomy on 06/19/2019  NGS Results: PD-L1 30%, MS--stable, 9 Muts/Mb  CURRENT THERAPY: Carboplatin & pemetrexed  BRIEF ONCOLOGIC HISTORY:  Oncology History  Adenocarcinoma of lung, stage 3, right ([place])  07/02/2019 Initial Diagnosis   Adenocarcinoma of lung, stage 3, right ([place])   07/18/2019 Genetic Testing   Foundation One     07/23/2019 Genetic Testing   PD-L1     09/09/2019 -  Chemotherapy   The patient had palonosetron (ALOXI) injection 0.25 mg, 0.25 mg, Intravenous,  Once, 0 of 4 cycles PEMEtrexed (ALIMTA) 700 mg in sodium chloride 0.9 % 100 mL chemo infusion, 400 mg/m2 = 700 mg (80 % of original dose 500 mg/m2), Intravenous,  Once, 0 of 4 cycles Dose modification: 400 mg/m2 (80 % of original dose 500 mg/m2, Cycle 1, Reason: Provider Judgment) CARBOplatin (PARAPLATIN) 480 mg in sodium chloride 0.9 % 250 mL chemo infusion, 480 mg (80 % of original dose 601.5 mg), Intravenous,  Once, 0 of 4 cycles Dose modification:   (original dose 601.5 mg, Cycle 1), 481.2 mg (80 % of original dose 601.5 mg, Cycle 1, Reason: Provider Judgment),   (original dose 601.5 mg, Cycle 1) fosaprepitant (EMEND) 150 mg in sodium chloride 0.9 % 145 mL IVPB, 150 mg, Intravenous,  Once, 0 of 4 cycles  for chemotherapy treatment.      CANCER STAGING: Cancer Staging No matching staging information was found for the patient.  INTERVAL HISTORY:  Ms. Kadeidra Coryell, a 54 y.o. female, returns for routine follow-up and consideration for next cycle of chemotherapy. Cadi was last seen on 08/20/2019.  Due for initiating cycle #1 of carboplatin and pemetrexed today.   She had her procedure for a port placement yesterday by Dr.  Aviva Signs and she tolerated it well; she is still tender around the port. She now complains of burning blisters on her right lower abdomen which started a couple days back; she links it to folic acid which she started taking daily. She denies having any more throat swelling since changing the manufacturer of her folic acid. Her SOB is stable.  Overall, she feels ready for next cycle of chemo today.    REVIEW OF SYSTEMS:  Review of Systems  Constitutional: Positive for appetite change (moderately decreased) and fatigue (moderate).  HENT:   Negative for sore throat.   Respiratory: Positive for shortness of breath.   Gastrointestinal: Positive for constipation.  Musculoskeletal: Positive for back pain (7/10 R mid/lower back pain).  All other systems reviewed and are negative.   PAST MEDICAL/SURGICAL HISTORY:  Past Medical History:  Diagnosis Date  . Alcoholic hepatitis without ascites   . Anxiety   . Arthritis    knees, hands  . 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 Bipolar disorder ([place])   . Cancer ([place])    right lung  . COPD (chronic obstructive pulmonary disease) ([place])   . Depression   . Dyspnea   . Emphysema lung ([place])   . GERD (gastroesophageal reflux disease)   . Hepatitis C    s/p treatment with Epclusa  . History of HPV infection   . History of pneumonia   . Pneumonia   .  Smoker    Past Surgical History:  Procedure Laterality Date  . BIOPSY  05/02/2018   Procedure: BIOPSY;  Surgeon: Daneil Dolin, MD;  [facility];  Service: Endoscopy;;  gastric  . CESAREAN SECTION    . CHEST TUBE INSERTION Right 06/26/2019   Procedure: Right CHEST TUBE REPLACEMENT;  Surgeon: [name], MD;  Location: [facility];  Service: Thoracic;  Laterality: Right;  . COLONOSCOPY WITH PROPOFOL N/A 03/03/2019   Procedure: COLONOSCOPY WITH PROPOFOL;  Surgeon: Daneil Dolin, MD;  [facility];  Service: Endoscopy;  Laterality: N/A;  9:15am   . ESOPHAGOGASTRODUODENOSCOPY (EGD) WITH PROPOFOL N/A 05/02/2018   Procedure: ESOPHAGOGASTRODUODENOSCOPY (EGD) WITH PROPOFOL;  Surgeon: Daneil Dolin, MD;  [facility];  Service: Endoscopy;  Laterality: N/A;  8:30am  . EYE SURGERY Bilateral    cataract  . HEMOSTASIS CLIP PLACEMENT  03/03/2019   Procedure: HEMOSTASIS CLIP PLACEMENT;  Surgeon: Daneil Dolin, MD;  [facility];  Service: Endoscopy;;  . INTERCOSTAL NERVE BLOCK Right 06/19/2019   Procedure: Intercostal Nerve Block;  Surgeon: [name], MD;  Location: [facility];  Service: Thoracic;  Laterality: Right;  . IR US GUIDE VASC ACCESS RIGHT  03/13/2018  . LOBECTOMY    . LYMPH NODE DISSECTION Right 06/19/2019   Procedure: Lymph Node Dissection;  Surgeon: [name], MD;  Location: [facility];  Service: Thoracic;  Laterality: Right;  . POLYPECTOMY  03/03/2019   Procedure: POLYPECTOMY;  Surgeon: Daneil Dolin, MD;  [facility];  Service: Endoscopy;;  . PORTACATH PLACEMENT Left 09/08/2019   Procedure: INSERTION PORT-A-CATH LEFT CHEST (attached catheter in left subclavian);  Surgeon: Aviva Signs, MD;  Location: [facility];  Service: General;  Laterality: Left;  . TONSILLECTOMY    . TOOTH EXTRACTION  01/31/2018   x 7  . VIDEO BRONCHOSCOPY WITH INSERTION OF INTERBRONCHIAL VALVE (IBV) N/A 06/26/2019   Procedure: VIDEO BRONCHOSCOPY WITH INSERTION OF TWO INTERBRONCHIAL VALVE (IBV);  Surgeon: [name], MD;  Location: [facility] OR;  Service: Thoracic;  Laterality: N/A;  . VIDEO BRONCHOSCOPY WITH INSERTION OF INTERBRONCHIAL VALVE (IBV) N/A 08/08/2019   Procedure: VIDEO BRONCHOSCOPY WITH REMOVAL OF INTERBRONCHIAL VALVE (IBV);  Surgeon: [name], MD;  Location: [facility] OR;  Service: Thoracic;  Laterality: N/A;    SOCIAL HISTORY:  Social History   Socioeconomic History  . Marital status: Single    Spouse name: Not on file  . Number of children: Not on file  . Years of education: Not on file    . Highest education level: Not on file  Occupational History  . Not on file  Tobacco Use  . Smoking status: Former Smoker    Packs/day: 0.50    Years: 38.00    Pack years: 19.00    Types: Cigarettes    Quit date: 06/19/2019    Years since quitting: 0.2  . Smokeless tobacco: Never Used  Vaping Use  . Vaping Use: Never used  Substance and Sexual Activity  . Alcohol use: Not Currently    Comment: Last use of alcohol 03/2016  . 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 Sexual activity: Not on file  Other Topics Concern  . Not on file  Social History Narrative  . Not on file   Social Determinants of Health   Financial Resource Strain:   . Difficulty of Paying Living Expenses:   Food Insecurity:   .  Worried About Charity fundraiser in the Last Year:   . Arboriculturist in the Last Year:   Transportation Needs:   . Film/video editor (Medical):   Marland Kitchen Lack of Transportation (Non-Medical):   Physical Activity:   . Days of Exercise per Week:   . Minutes of Exercise per Session:   Stress:   . Feeling of Stress :   Social Connections:   . Frequency of Communication with Friends and Family:   . Frequency of Social Gatherings with Friends and Family:   . Attends Religious Services:   . Active Member of Clubs or Organizations:   . Attends Archivist Meetings:   Marland Kitchen Marital Status:   Intimate Partner Violence:   . Fear of Current or Ex-Partner:   . Emotionally Abused:   Marland Kitchen Physically Abused:   . Sexually Abused:     FAMILY HISTORY:  Family History  Problem Relation Age of Onset  . Congenital heart disease Mother   . Bipolar disorder Mother   . Prostate cancer Brother   . Alzheimer's disease Paternal Grandmother   . Colon cancer Neg Hx     CURRENT MEDICATIONS:  Current Outpatient Medications  Medication Sig Dispense Refill  . albuterol (VENTOLIN HFA) 108 (90 Base) MCG/ACT inhaler Inhale 2 puffs into the lungs every 4 (four) hours as  needed for wheezing or shortness of breath.     . Cyanocobalamin 1000 MCG/ML KIT Inject 1,000 mcg as directed See admin instructions. Every 9 weeks    . diphenhydrAMINE (BENADRYL) 25 MG tablet Take 25 mg by mouth at bedtime.     . divalproex (DEPAKOTE) 250 MG DR tablet Take 250 mg by mouth at bedtime.     . lidocaine (LIDODERM) 5 % Place 1 patch onto the skin daily. Remove & Discard patch within 12 hours or as directed by MD 30 patch 0  . lidocaine-prilocaine (EMLA) cream Apply a small amount to port a cath site and cover with plastic wrap 1 hour prior to chemotherapy appointments 30 g 3  . ondansetron (ZOFRAN ODT) 8 MG disintegrating tablet Place 1 tablet under tongue every 8 hours as needed for nausea. You may begin using this medication on the third day after each chemotherapy treatment. 20 tablet 1  . pantoprazole (PROTONIX) 40 MG tablet Take one tablet before breakfast daily. (Patient taking differently: Take 40 mg by mouth daily before breakfast. ) 90 tablet 3  . risperiDONE (RISPERDAL) 2 MG tablet Take 2 mg by mouth at bedtime.     Marland Kitchen scopolamine (TRANSDERM-SCOP) 1 MG/3DAYS Place 1 patch (1.5 mg total) onto the skin every 3 (three) days. 10 patch 12  . SYMBICORT 160-4.5 MCG/ACT inhaler Inhale 2 puffs into the lungs 2 (two) times daily.    . traMADol (ULTRAM) 50 MG tablet Take 1 tablet (50 mg total) by mouth every 6 (six) hours as needed. 20 tablet 0   No current facility-administered medications for this visit.    ALLERGIES:  Allergies  Allergen Reactions  . Folic Acid Anaphylaxis  . Penicillins Anaphylaxis    Throat swelled Did it involve swelling of the face/tongue/throat, SOB, or low BP? Yes Did it involve sudden or severe rash/hives, skin peeling, or any reaction on the inside of your mouth or nose? No Did you need to seek medical attention at a hospital or doctor's office? No When did it last happen?childhood allergy If all above answers are "NO", may proceed with  cephalosporin use.   Marland Kitchen  Amoxicillin     hallucinations Did it involve swelling of the face/tongue/throat, SOB, or low BP? No Did it involve sudden or severe rash/hives, skin peeling, or any reaction on the inside of your mouth or nose? No Did you need to seek medical attention at a hospital or doctor's office? Yes When did it last happen?July 2019 If all above answers are "NO", may proceed with cephalosporin use.   . Fish Allergy Nausea Only  . Other Swelling    Patient states that the sunrise brand folic acid made her feel like her throat was swelling.     PHYSICAL EXAM:  Performance status (ECOG): 1 - Symptomatic but completely ambulatory  Vitals:   09/09/19 0905  BP: 103/67  Pulse: 100  Resp: 18  Temp: (!) 97.3 F (36.3 C)  SpO2: 95%   Wt Readings from Last 3 Encounters:  09/09/19 164 lb 9.6 oz (74.7 kg)  09/08/19 162 lb 14.7 oz (73.9 kg)  09/05/19 163 lb (73.9 kg)   Physical Exam Vitals reviewed.  Constitutional:      Appearance: Normal appearance. She is obese.  Cardiovascular:     Rate and Rhythm: Normal rate and regular rhythm.     Pulses: Normal pulses.     Heart sounds: Normal heart sounds.  Pulmonary:     Effort: Pulmonary effort is normal.     Breath sounds: Normal breath sounds.  Chest:     Comments: Port-a-Cath on L chest Skin:    Findings: Rash (3 small ulcers on RLQ) present.  Neurological:     General: No focal deficit present.     Mental Status: She is alert and oriented to person, place, and time.  Psychiatric:        Mood and Affect: Mood normal.        Behavior: Behavior normal.     LABORATORY DATA:  I have reviewed the labs as listed.  CBC Latest Ref Rng & Units 09/09/2019 08/05/2019 06/21/2019  WBC 4.0 - 10.5 K/uL 5.4 7.4 13.1(H)  Hemoglobin 12.0 - 15.0 g/dL 12.5 13.4 10.2(L)  Hematocrit 36 - 46 % 39.3 43.4 30.9(L)  Platelets 150 - 400 K/uL 135(L) 185 114(L)   CMP Latest Ref Rng & Units 09/09/2019 08/05/2019 06/21/2019  Glucose 70 -  99 mg/dL 133(H) 90 150(H)  BUN 6 - 20 mg/dL 7 <5(L) 14  Creatinine 0.44 - 1.00 mg/dL 0.69 0.62 0.76  Sodium 135 - 145 mmol/L 133(L) 136 131(L)  Potassium 3.5 - 5.1 mmol/L 3.6 3.6 4.6  Chloride 98 - 111 mmol/L 99 99 95(L)  CO2 22 - 32 mmol/L 21(L) 25 25  Calcium 8.9 - 10.3 mg/dL 8.8(L) 9.8 8.5(L)  Total Protein 6.5 - 8.1 g/dL 7.0 7.8 6.2(L)  Total Bilirubin 0.3 - 1.2 mg/dL 0.4 0.6 1.0  Alkaline Phos 38 - 126 U/L 56 69 46  AST 15 - 41 U/L '17 25 27  '$ ALT 0 - 44 U/L '12 16 18    '$ DIAGNOSTIC IMAGING:  I have independently reviewed the scans and discussed with the patient. DG Lumbar Spine Complete  Result Date: 09/05/2019 CLINICAL DATA:  Right flank pain for 1 week. History of lung cancer. EXAM: LUMBAR SPINE - COMPLETE 4+ VIEW COMPARISON:  PET-CT 05/13/2019. FINDINGS: There are 5 non rib-bearing lumbar type vertebrae. Vertebral alignment is normal. No fracture is identified. There is mild disc space narrowing from L3-4 to L5-S1. Small calcifications in the pelvis likely represent phleboliths. IMPRESSION: Mild lumbar disc degeneration.  No acute osseous abnormality.  Electronically Signed   By: Logan Bores M.D.   On: 09/05/2019 11:06   DG Chest Port 1 View  Result Date: 09/08/2019 CLINICAL DATA:  Port-A-Cath placement. EXAM: PORTABLE CHEST 1 VIEW COMPARISON:  08/05/2019 FINDINGS: Left subclavian power port is in good position with its tip in the distal SVC. No complicating features are identified. The cardiac silhouette, mediastinal and hilar contours are within normal limits and stable. Stable loss of volume in the right hemithorax with basilar scarring changes. No acute pulmonary findings. IMPRESSION: Left subclavian power port in good position without complicating features. Electronically Signed   By: Marijo Sanes M.D.   On: 09/08/2019 10:04   DG C-Arm 1-60 Min-No Report  Result Date: 09/08/2019 Fluoroscopy was utilized by the requesting physician.  No radiographic interpretation.   CT Renal  Stone Study  Result Date: 09/05/2019 CLINICAL DATA:  RIGHT flank pain EXAM: CT ABDOMEN AND PELVIS WITHOUT CONTRAST TECHNIQUE: Multidetector CT imaging of the abdomen and pelvis was performed following the standard protocol without IV contrast. COMPARISON:  March 13, 2018, May 01, 2019. FINDINGS: Lower chest: There is a small to moderate loculated RIGHT pleural effusion with a heterogeneous density likely reflecting postsurgical changes/debris. RIGHT lower lung atelectasis. Coronary artery atherosclerotic calcifications. No significant pericardial effusion. Hepatobiliary: Nodular contour of the liver consistent with underlying cirrhosis. Gallbladder is unremarkable. No intrahepatic or extrahepatic biliary ductal dilation Pancreas: Unremarkable. No pancreatic ductal dilatation or surrounding inflammatory changes. Spleen: Spleen is the upper limits of normal in size. Adrenals/Urinary Tract: Adrenals are unremarkable. Nonobstructive LEFT-sided nephrolithiasis. No hydronephrosis. Bladder is unremarkable. Stomach/Bowel: Stomach is within normal limits. Appendix appears normal. No evidence of bowel wall thickening, distention, or inflammatory changes. Diverticulosis. Revisualization of duodenal lipoma, unchanged. Vascular/Lymphatic: Moderate to severe atherosclerotic calcifications of the aorta. Reproductive: Fibroid uterus.  No suspicious adnexal masses. Other: No ascites. Musculoskeletal: Unchanged nondisplaced RIGHT-sided rib fracture of the RIGHT lateral seventh rib. Unchanged nondisplaced fracture of the RIGHT lateral eighth rib. Remote rib fractures of the lateral RIGHT sixth and fifth ribs. IMPRESSION: 1. No acute intra-abdominal findings to explain the patient's right flank pain. 2. Small to moderate loculated RIGHT pleural effusion with internal mildly heterogeneous density likely reflecting postsurgical changes/debris. There may be a sub pulmonic component. 3. Cirrhosis. 4. Nonobstructive LEFT-sided  nephrolithiasis. Aortic Atherosclerosis (ICD10-I70.0). Electronically Signed   By: Valentino Saxon MD   On: 09/05/2019 12:28     ASSESSMENT:  1.Stage IIIa (PT1CPN2) right lung adenocarcinoma: -PET scan on 05/13/2019 showed right lower lobe nodule concerning for bronchogenic carcinoma. Cirrhotic liver. -Right lower lobectomy and lymph node excision on 06/19/2019. -Pathology showed 1.7 cm right lower lobe adenocarcinoma, unifocal, lymphovascular invasion present. Margins negative. 2/14 lymph nodes positive (level 7 and 10R). PT1CPN2. -PD-L1 30%, K-ras G12A, MSI stable.  EGFR mutation not identified.   PLAN:  1.Stage III right lung adenocarcinoma: -I have reviewed her labs today.  Platelet count is 135.  White count is normal.  Creatinine is normal.  LFTs are normal. -She will proceed with her first cycle today.  I plan to start with 20% dose reduction.  I will increase to normal dose once she tolerates it well. -She was told to continue folic acid daily.  I will reevaluate her in 1 week for toxicity assessment and labs.  2. Immune mediated thrombocytopenia: -She was treated for hepatitis C in the past and also has cirrhosis.  Platelet count today is 135.  We will closely monitor.   Orders placed this encounter:  No orders  of the defined types were placed in this encounter.    Derek Jack, MD [place] [phone]   I, Milinda Antis, am acting as a scribe for Dr. Sanda Linger.  I, Derek Jack MD, have reviewed the above documentation for accuracy and completeness, and I agree with the above.

## 2019-09-09 NOTE — Progress Notes (Signed)
Patient has been assessed, vital signs and labs have been reviewed by Dr. Delton Coombes. ANC, Creatinine, LFTs, and Platelets are within treatment parameters per Dr. Delton Coombes. Per Dr. Delton Coombes there is a 20 % dose reduction today. The patient is good to proceed with treatment at this time.

## 2019-09-09 NOTE — Progress Notes (Signed)
Labs reviewed today. Will start treatment today at a 20 % dose reduction today per MD.   Treatment given per orders. Patient tolerated it well without problems. Vitals stable and discharged home from clinic ambulatory. Follow up as scheduled.

## 2019-09-09 NOTE — Patient Instructions (Signed)
[facility] Discharge Instructions for Patients Receiving Chemotherapy  Today you received the following chemotherapy agents   To help prevent nausea and vomiting after your treatment, we encourage you to take your nausea medication   If you develop nausea and vomiting that is not controlled by your nausea medication, call the clinic.   BELOW ARE SYMPTOMS THAT SHOULD BE REPORTED IMMEDIATELY:  *FEVER GREATER THAN 100.5 F  *CHILLS WITH OR WITHOUT FEVER  NAUSEA AND VOMITING THAT IS NOT CONTROLLED WITH YOUR NAUSEA MEDICATION  *UNUSUAL SHORTNESS OF BREATH  *UNUSUAL BRUISING OR BLEEDING  TENDERNESS IN MOUTH AND THROAT WITH OR WITHOUT PRESENCE OF ULCERS  *URINARY PROBLEMS  *BOWEL PROBLEMS  UNUSUAL RASH Items with * indicate a potential emergency and should be followed up as soon as possible.  Feel free to call the clinic should you have any questions or concerns. The clinic phone number is [phone].  Please show the CHEMO ALERT CARD at check-in to the Emergency Department and triage nurse.   

## 2019-09-10 ENCOUNTER — Telehealth (HOSPITAL_COMMUNITY): Payer: Self-pay

## 2019-09-10 NOTE — Telephone Encounter (Signed)
Tried to call patient for her 24 hour follow up, was unable to leave a message, no voicemail set up.

## 2019-09-17 ENCOUNTER — Inpatient Hospital Stay (HOSPITAL_COMMUNITY): Payer: Medicaid Other

## 2019-09-17 ENCOUNTER — Encounter (HOSPITAL_COMMUNITY): Payer: Self-pay | Admitting: Hematology

## 2019-09-17 ENCOUNTER — Inpatient Hospital Stay (HOSPITAL_BASED_OUTPATIENT_CLINIC_OR_DEPARTMENT_OTHER): Payer: Medicaid Other | Admitting: Hematology

## 2019-09-17 ENCOUNTER — Other Ambulatory Visit: Payer: Self-pay

## 2019-09-17 VITALS — BP 116/77 | HR 95 | Temp 97.3°F | Resp 18 | Wt 160.8 lb

## 2019-09-17 DIAGNOSIS — Z5111 Encounter for antineoplastic chemotherapy: Secondary | ICD-10-CM | POA: Diagnosis not present

## 2019-09-17 DIAGNOSIS — C3431 Malignant neoplasm of lower lobe, right bronchus or lung: Secondary | ICD-10-CM

## 2019-09-17 DIAGNOSIS — C3491 Malignant neoplasm of unspecified part of right bronchus or lung: Secondary | ICD-10-CM

## 2019-09-17 LAB — CBC WITH DIFFERENTIAL/PLATELET
Abs Immature Granulocytes: 0.03 10*3/uL (ref 0.00–0.07)
Basophils Absolute: 0 10*3/uL (ref 0.0–0.1)
Basophils Relative: 0 %
Eosinophils Absolute: 0 10*3/uL (ref 0.0–0.5)
Eosinophils Relative: 1 %
HCT: 35.6 % — ABNORMAL LOW (ref 36.0–46.0)
Hemoglobin: 11.3 g/dL — ABNORMAL LOW (ref 12.0–15.0)
Immature Granulocytes: 1 %
Lymphocytes Relative: 26 %
Lymphs Abs: 1.1 10*3/uL (ref 0.7–4.0)
MCH: 26.7 pg (ref 26.0–34.0)
MCHC: 31.7 g/dL (ref 30.0–36.0)
MCV: 84.2 fL (ref 80.0–100.0)
Monocytes Absolute: 0.4 10*3/uL (ref 0.1–1.0)
Monocytes Relative: 9 %
Neutro Abs: 2.8 10*3/uL (ref 1.7–7.7)
Neutrophils Relative %: 63 %
Platelets: 107 10*3/uL — ABNORMAL LOW (ref 150–400)
RBC: 4.23 MIL/uL (ref 3.87–5.11)
RDW: 14.6 % (ref 11.5–15.5)
WBC: 4.5 10*3/uL (ref 4.0–10.5)
nRBC: 0 % (ref 0.0–0.2)

## 2019-09-17 LAB — COMPREHENSIVE METABOLIC PANEL
ALT: 39 U/L (ref 0–44)
AST: 38 U/L (ref 15–41)
Albumin: 3.7 g/dL (ref 3.5–5.0)
Alkaline Phosphatase: 57 U/L (ref 38–126)
Anion gap: 10 (ref 5–15)
BUN: 8 mg/dL (ref 6–20)
CO2: 24 mmol/L (ref 22–32)
Calcium: 8.9 mg/dL (ref 8.9–10.3)
Chloride: 97 mmol/L — ABNORMAL LOW (ref 98–111)
Creatinine, Ser: 0.53 mg/dL (ref 0.44–1.00)
GFR calc Af Amer: >60 mL/min (ref >60)
GFR calc non Af Amer: >60 mL/min (ref >60)
Glucose, Bld: 166 mg/dL — ABNORMAL HIGH (ref 70–99)
Potassium: 3.2 mmol/L — ABNORMAL LOW (ref 3.5–5.1)
Sodium: 131 mmol/L — ABNORMAL LOW (ref 135–145)
Total Bilirubin: 0.4 mg/dL (ref 0.3–1.2)
Total Protein: 7.1 g/dL (ref 6.5–8.1)

## 2019-09-17 MED ORDER — SODIUM CHLORIDE 0.9% FLUSH
10.0000 mL | Freq: Once | INTRAVENOUS | Status: AC
  Administered 2019-09-17: 10 mL via INTRAVENOUS

## 2019-09-17 MED ORDER — OCTREOTIDE ACETATE 30 MG IM KIT
PACK | INTRAMUSCULAR | Status: AC
  Filled 2019-09-17: qty 1

## 2019-09-17 MED ORDER — POTASSIUM CHLORIDE CRYS ER 20 MEQ PO TBCR
40.0000 meq | EXTENDED_RELEASE_TABLET | Freq: Once | ORAL | Status: AC
  Administered 2019-09-17: 40 meq via ORAL
  Filled 2019-09-17: qty 2

## 2019-09-17 MED ORDER — HEPARIN SOD (PORK) LOCK FLUSH 100 UNIT/ML IV SOLN
500.0000 [IU] | Freq: Once | INTRAVENOUS | Status: AC
  Administered 2019-09-17: 500 [IU] via INTRAVENOUS

## 2019-09-17 NOTE — Progress Notes (Signed)
Patient assessed by Dr. Delton Coombes and labs reviewed. He is aware of potassium 3.2.  Orders received for potassium 40 mEq by mouth times one dose here.  Primary RN aware. Patient will get fluids today and return in 2 weeks for her next treatment.

## 2019-09-17 NOTE — Patient Instructions (Signed)
[place] at [facility] Discharge Instructions  You were seen today by Dr. Delton Coombes. He went over your recent results. You received fluid today. Dr. Delton Coombes will see you back in 2 weeks for labs and follow up.   Thank you for choosing [place] at [facility] to provide your oncology and hematology care.  To afford each patient quality time with our provider, please arrive at least 15 minutes before your scheduled appointment time.   If you have a lab appointment with the [facility] please come in thru the Main Entrance and check in at the main information desk  You need to re-schedule your appointment should you arrive 10 or more minutes late.  We strive to give you quality time with our providers, and arriving late affects you and other patients whose appointments are after yours.  Also, if you no show three or more times for appointments you may be dismissed from the clinic at the providers discretion.     Again, thank you for choosing [facility].  Our hope is that these requests will decrease the amount of time that you wait before being seen by our physicians.       _____________________________________________________________  Should you have questions after your visit to [facility], please contact our office at [phone] between the hours of 8:00 a.m. and 4:30 p.m.  Voicemails left after 4:00 p.m. will not be returned until the following business day.  For prescription refill requests, have your pharmacy contact our office and allow 72 hours.    Cancer Center Support Programs:   > Cancer Support Group  2nd Tuesday of the month 1pm-2pm, Journey Room

## 2019-09-17 NOTE — Progress Notes (Signed)
Rachel Gross, [address] [address]   CLINIC:  Medical Oncology/Hematology  PCP:  Celene Squibb, MD [address] [name] [address] [phone]   REASON FOR VISIT:  Follow-up for stage III right lung cancer  PRIOR THERAPY: Right lower lobectomy on 06/19/2019  NGS Results: PD-L1 30%, MS--stable, 9 Muts/Mb  CURRENT THERAPY: Carboplatin and pemetrexed every 3 weeks  BRIEF ONCOLOGIC HISTORY:  Oncology History  Adenocarcinoma of lung, stage 3, right ([place])  07/02/2019 Initial Diagnosis   Adenocarcinoma of lung, stage 3, right ([place])   07/18/2019 Genetic Testing   Foundation One     07/23/2019 Genetic Testing   PD-L1     09/09/2019 -  Chemotherapy   The patient had palonosetron (ALOXI) injection 0.25 mg, 0.25 mg, Intravenous,  Once, 1 of 4 cycles Administration: 0.25 mg (09/09/2019) PEMEtrexed (ALIMTA) 700 mg in sodium chloride 0.9 % 100 mL chemo infusion, 400 mg/m2 = 700 mg (80 % of original dose 500 mg/m2), Intravenous,  Once, 1 of 4 cycles Dose modification: 400 mg/m2 (80 % of original dose 500 mg/m2, Cycle 1, Reason: Provider Judgment) Administration: 700 mg (09/09/2019) CARBOplatin (PARAPLATIN) 480 mg in sodium chloride 0.9 % 250 mL chemo infusion, 480 mg (80 % of original dose 601.5 mg), Intravenous,  Once, 1 of 4 cycles Dose modification:   (original dose 601.5 mg, Cycle 1), 481.2 mg (80 % of original dose 601.5 mg, Cycle 1, Reason: Provider Judgment),   (original dose 601.5 mg, Cycle 1) Administration: 480 mg (09/09/2019) fosaprepitant (EMEND) 150 mg in sodium chloride 0.9 % 145 mL IVPB, 150 mg, Intravenous,  Once, 1 of 4 cycles Administration: 150 mg (09/09/2019)  for chemotherapy treatment.      CANCER STAGING: Cancer Staging No matching staging information was found for the patient.  INTERVAL HISTORY:  [name], a 54 y.o. female, returns for routine follow-up of her stage III right lung cancer. Rachel Gross was last seen  on 09/09/2019. Her first chemo treatment was on 09/09/2019.  Today she reports that she felt sleepy the day after the chemo treatment and sick to her stomach without vomiting on Monday, 8/16. Her appetite is okay and she is drinking plenty of fluids. She denies feeling nauseous today. She feels slightly fatigued.   REVIEW OF SYSTEMS:  Review of Systems  Constitutional: Positive for appetite change (mildly decreased) and fatigue (moderate).  HENT:   Positive for trouble swallowing (solids & liquids).   All other systems reviewed and are negative.   PAST MEDICAL/SURGICAL HISTORY:  Past Medical History:  Diagnosis Date  . Alcoholic hepatitis without ascites   . Anxiety   . Arthritis    knees, hands  . 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 Bipolar disorder ([place])   . Cancer ([place])    right lung  . COPD (chronic obstructive pulmonary disease) ([place])   . Depression   . Dyspnea   . Emphysema lung ([place])   . GERD (gastroesophageal reflux disease)   . Hepatitis C    s/p treatment with Epclusa  . History of HPV infection   . History of pneumonia   . Pneumonia   . Smoker    Past Surgical History:  Procedure Laterality Date  . BIOPSY  05/02/2018   Procedure: BIOPSY;  Surgeon: Daneil Dolin, MD;  [facility];  Service: Endoscopy;;  gastric  . CESAREAN SECTION    . CHEST TUBE INSERTION Right  06/26/2019   Procedure: Right CHEST TUBE REPLACEMENT;  Surgeon: [name], MD;  Location: [facility];  Service: Thoracic;  Laterality: Right;  . COLONOSCOPY WITH PROPOFOL N/A 03/03/2019   Procedure: COLONOSCOPY WITH PROPOFOL;  Surgeon: Daneil Dolin, MD;  [facility];  Service: Endoscopy;  Laterality: N/A;  9:15am  . ESOPHAGOGASTRODUODENOSCOPY (EGD) WITH PROPOFOL N/A 05/02/2018   Procedure: ESOPHAGOGASTRODUODENOSCOPY (EGD) WITH PROPOFOL;  Surgeon: Daneil Dolin, MD;  [facility];  Service: Endoscopy;  Laterality: N/A;  8:30am  . EYE  SURGERY Bilateral    cataract  . HEMOSTASIS CLIP PLACEMENT  03/03/2019   Procedure: HEMOSTASIS CLIP PLACEMENT;  Surgeon: Daneil Dolin, MD;  [facility];  Service: Endoscopy;;  . INTERCOSTAL NERVE BLOCK Right 06/19/2019   Procedure: Intercostal Nerve Block;  Surgeon: [name], MD;  Location: [facility];  Service: Thoracic;  Laterality: Right;  . IR US GUIDE VASC ACCESS RIGHT  03/13/2018  . LOBECTOMY    . LYMPH NODE DISSECTION Right 06/19/2019   Procedure: Lymph Node Dissection;  Surgeon: [name], MD;  Location: [facility];  Service: Thoracic;  Laterality: Right;  . POLYPECTOMY  03/03/2019   Procedure: POLYPECTOMY;  Surgeon: Daneil Dolin, MD;  [facility];  Service: Endoscopy;;  . PORTACATH PLACEMENT Left 09/08/2019   Procedure: INSERTION PORT-A-CATH LEFT CHEST (attached catheter in left subclavian);  Surgeon: Aviva Signs, MD;  Location: [facility];  Service: General;  Laterality: Left;  . TONSILLECTOMY    . TOOTH EXTRACTION  01/31/2018   x 7  . VIDEO BRONCHOSCOPY WITH INSERTION OF INTERBRONCHIAL VALVE (IBV) N/A 06/26/2019   Procedure: VIDEO BRONCHOSCOPY WITH INSERTION OF TWO INTERBRONCHIAL VALVE (IBV);  Surgeon: [name], MD;  Location: [facility] OR;  Service: Thoracic;  Laterality: N/A;  . VIDEO BRONCHOSCOPY WITH INSERTION OF INTERBRONCHIAL VALVE (IBV) N/A 08/08/2019   Procedure: VIDEO BRONCHOSCOPY WITH REMOVAL OF INTERBRONCHIAL VALVE (IBV);  Surgeon: [name], MD;  Location: [facility] OR;  Service: Thoracic;  Laterality: N/A;    SOCIAL HISTORY:  Social History   Socioeconomic History  . Marital status: Single    Spouse name: Not on file  . Number of children: Not on file  . Years of education: Not on file  . Highest education level: Not on file  Occupational History  . Not on file  Tobacco Use  . Smoking status: Former Smoker    Packs/day: 0.50    Years: 38.00    Pack years: 19.00    Types: Cigarettes    Quit date: 06/19/2019     Years since quitting: 0.2  . Smokeless tobacco: Never Used  Vaping Use  . Vaping Use: Never used  Substance and Sexual Activity  . Alcohol use: Not Currently    Comment: Last use of alcohol 03/2016  . 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 Sexual activity: Not on file  Other Topics Concern  . Not on file  Social History Narrative  . Not on file   Social Determinants of Health   Financial Resource Strain:   . Difficulty of Paying Living Expenses:   Food Insecurity:   . Worried About Charity fundraiser in the Last Year:   . Arboriculturist in the Last Year:   Transportation Needs:   . Film/video editor (Medical):   Marland Kitchen Lack of Transportation (Non-Medical):   Physical Activity:   .  Days of Exercise per Week:   . Minutes of Exercise per Session:   Stress:   . Feeling of Stress :   Social Connections:   . Frequency of Communication with Friends and Family:   . Frequency of Social Gatherings with Friends and Family:   . Attends Religious Services:   . Active Member of Clubs or Organizations:   . Attends Archivist Meetings:   Marland Kitchen Marital Status:   Intimate Partner Violence:   . Fear of Current or Ex-Partner:   . Emotionally Abused:   Marland Kitchen Physically Abused:   . Sexually Abused:     FAMILY HISTORY:  Family History  Problem Relation Age of Onset  . Congenital heart disease Mother   . Bipolar disorder Mother   . Prostate cancer Brother   . Alzheimer's disease Paternal Grandmother   . Colon cancer Neg Hx     CURRENT MEDICATIONS:  Current Outpatient Medications  Medication Sig Dispense Refill  . albuterol (VENTOLIN HFA) 108 (90 Base) MCG/ACT inhaler Inhale 2 puffs into the lungs every 4 (four) hours as needed for wheezing or shortness of breath.     . diphenhydrAMINE (BENADRYL) 25 MG tablet Take 25 mg by mouth at bedtime.     . divalproex (DEPAKOTE) 250 MG DR tablet Take 250 mg by mouth at bedtime.     . lidocaine (LIDODERM) 5 % Place  1 patch onto the skin daily. Remove & Discard patch within 12 hours or as directed by MD 30 patch 0  . lidocaine-prilocaine (EMLA) cream Apply a small amount to port a cath site and cover with plastic wrap 1 hour prior to chemotherapy appointments 30 g 3  . pantoprazole (PROTONIX) 40 MG tablet Take one tablet before breakfast daily. (Patient taking differently: Take 40 mg by mouth daily before breakfast. ) 90 tablet 3  . risperiDONE (RISPERDAL) 2 MG tablet Take 2 mg by mouth at bedtime.     Marland Kitchen scopolamine (TRANSDERM-SCOP) 1 MG/3DAYS Place 1 patch (1.5 mg total) onto the skin every 3 (three) days. 10 patch 12  . SYMBICORT 160-4.5 MCG/ACT inhaler Inhale 2 puffs into the lungs 2 (two) times daily.    . traMADol (ULTRAM) 50 MG tablet Take 1 tablet (50 mg total) by mouth every 6 (six) hours as needed. 20 tablet 0  . ondansetron (ZOFRAN ODT) 8 MG disintegrating tablet Place 1 tablet under tongue every 8 hours as needed for nausea. You may begin using this medication on the third day after each chemotherapy treatment. (Patient not taking: Reported on 09/17/2019) 20 tablet 1   No current facility-administered medications for this visit.    ALLERGIES:  Allergies  Allergen Reactions  . Folic Acid Anaphylaxis    Not entirely clear if it is from folic acid. She is taking folic acid now and doesn't have the throat swelling anymore  . Penicillins Anaphylaxis    Throat swelled Did it involve swelling of the face/tongue/throat, SOB, or low BP? Yes Did it involve sudden or severe rash/hives, skin peeling, or any reaction on the inside of your mouth or nose? No Did you need to seek medical attention at a hospital or doctor's office? No When did it last happen?childhood allergy If all above answers are "NO", may proceed with cephalosporin use.   Marland Kitchen Amoxicillin     hallucinations Did it involve swelling of the face/tongue/throat, SOB, or low BP? No Did it involve sudden or severe rash/hives, skin peeling,  or any reaction on the inside  of your mouth or nose? No Did you need to seek medical attention at a hospital or doctor's office? Yes When did it last happen?July 2019 If all above answers are "NO", may proceed with cephalosporin use.   . Fish Allergy Nausea Only  . Other Swelling    Patient states that the sunrise brand folic acid made her feel like her throat was swelling.     PHYSICAL EXAM:  Performance status (ECOG): 1 - Symptomatic but completely ambulatory  Vitals:   09/17/19 0823  BP: 116/77  Pulse: 95  Resp: 18  Temp: (!) 97.3 F (36.3 C)  SpO2: 95%   Wt Readings from Last 3 Encounters:  09/17/19 160 lb 12.8 oz (72.9 kg)  09/09/19 164 lb 9.6 oz (74.7 kg)  09/08/19 162 lb 14.7 oz (73.9 kg)   Physical Exam Vitals reviewed.  Constitutional:      Appearance: Normal appearance. She is obese.  Cardiovascular:     Rate and Rhythm: Normal rate and regular rhythm.     Pulses: Normal pulses.     Heart sounds: Normal heart sounds.  Pulmonary:     Effort: Pulmonary effort is normal.     Breath sounds: Normal breath sounds.  Chest:     Comments: Port-a-Cath in L chest Musculoskeletal:     Right lower leg: No edema.     Left lower leg: No edema.  Neurological:     General: No focal deficit present.     Mental Status: She is alert and oriented to person, place, and time.  Psychiatric:        Mood and Affect: Mood normal.        Behavior: Behavior normal.      LABORATORY DATA:  I have reviewed the labs as listed.  CBC Latest Ref Rng & Units 09/17/2019 09/09/2019 08/05/2019  WBC 4.0 - 10.5 K/uL 4.5 5.4 7.4  Hemoglobin 12.0 - 15.0 g/dL 11.3(L) 12.5 13.4  Hematocrit 36 - 46 % 35.6(L) 39.3 43.4  Platelets 150 - 400 K/uL 107(L) 135(L) 185   CMP Latest Ref Rng & Units 09/17/2019 09/09/2019 08/05/2019  Glucose 70 - 99 mg/dL 166(H) 133(H) 90  BUN 6 - 20 mg/dL 8 7 <5(L)  Creatinine 0.44 - 1.00 mg/dL 0.53 0.69 0.62  Sodium 135 - 145 mmol/L 131(L) 133(L) 136  Potassium 3.5  - 5.1 mmol/L 3.2(L) 3.6 3.6  Chloride 98 - 111 mmol/L 97(L) 99 99  CO2 22 - 32 mmol/L 24 21(L) 25  Calcium 8.9 - 10.3 mg/dL 8.9 8.8(L) 9.8  Total Protein 6.5 - 8.1 g/dL 7.1 7.0 7.8  Total Bilirubin 0.3 - 1.2 mg/dL 0.4 0.4 0.6  Alkaline Phos 38 - 126 U/L 57 56 69  AST 15 - 41 U/L 38 17 25  ALT 0 - 44 U/L 39 12 16    DIAGNOSTIC IMAGING:  I have independently reviewed the scans and discussed with the patient. DG Lumbar Spine Complete  Result Date: 09/05/2019 CLINICAL DATA:  Right flank pain for 1 week. History of lung cancer. EXAM: LUMBAR SPINE - COMPLETE 4+ VIEW COMPARISON:  PET-CT 05/13/2019. FINDINGS: There are 5 non rib-bearing lumbar type vertebrae. Vertebral alignment is normal. No fracture is identified. There is mild disc space narrowing from L3-4 to L5-S1. Small calcifications in the pelvis likely represent phleboliths. IMPRESSION: Mild lumbar disc degeneration.  No acute osseous abnormality. Electronically Signed   By: Logan Bores M.D.   On: 09/05/2019 11:06   DG Chest [facility] 1 View  Result  Date: 09/08/2019 CLINICAL DATA:  Port-A-Cath placement. EXAM: PORTABLE CHEST 1 VIEW COMPARISON:  08/05/2019 FINDINGS: Left subclavian power port is in good position with its tip in the distal SVC. No complicating features are identified. The cardiac silhouette, mediastinal and hilar contours are within normal limits and stable. Stable loss of volume in the right hemithorax with basilar scarring changes. No acute pulmonary findings. IMPRESSION: Left subclavian power port in good position without complicating features. Electronically Signed   By: Marijo Sanes M.D.   On: 09/08/2019 10:04   DG C-Arm 1-60 Min-No Report  Result Date: 09/08/2019 Fluoroscopy was utilized by the requesting physician.  No radiographic interpretation.   CT Renal Stone Study  Result Date: 09/05/2019 CLINICAL DATA:  RIGHT flank pain EXAM: CT ABDOMEN AND PELVIS WITHOUT CONTRAST TECHNIQUE: Multidetector CT imaging of the abdomen  and pelvis was performed following the standard protocol without IV contrast. COMPARISON:  March 13, 2018, May 01, 2019. FINDINGS: Lower chest: There is a small to moderate loculated RIGHT pleural effusion with a heterogeneous density likely reflecting postsurgical changes/debris. RIGHT lower lung atelectasis. Coronary artery atherosclerotic calcifications. No significant pericardial effusion. Hepatobiliary: Nodular contour of the liver consistent with underlying cirrhosis. Gallbladder is unremarkable. No intrahepatic or extrahepatic biliary ductal dilation Pancreas: Unremarkable. No pancreatic ductal dilatation or surrounding inflammatory changes. Spleen: Spleen is the upper limits of normal in size. Adrenals/Urinary Tract: Adrenals are unremarkable. Nonobstructive LEFT-sided nephrolithiasis. No hydronephrosis. Bladder is unremarkable. Stomach/Bowel: Stomach is within normal limits. Appendix appears normal. No evidence of bowel wall thickening, distention, or inflammatory changes. Diverticulosis. Revisualization of duodenal lipoma, unchanged. Vascular/Lymphatic: Moderate to severe atherosclerotic calcifications of the aorta. Reproductive: Fibroid uterus.  No suspicious adnexal masses. Other: No ascites. Musculoskeletal: Unchanged nondisplaced RIGHT-sided rib fracture of the RIGHT lateral seventh rib. Unchanged nondisplaced fracture of the RIGHT lateral eighth rib. Remote rib fractures of the lateral RIGHT sixth and fifth ribs. IMPRESSION: 1. No acute intra-abdominal findings to explain the patient's right flank pain. 2. Small to moderate loculated RIGHT pleural effusion with internal mildly heterogeneous density likely reflecting postsurgical changes/debris. There may be a sub pulmonic component. 3. Cirrhosis. 4. Nonobstructive LEFT-sided nephrolithiasis. Aortic Atherosclerosis (ICD10-I70.0). Electronically Signed   By: Valentino Saxon MD   On: 09/05/2019 12:28     ASSESSMENT:  1.Stage IIIa (PT1CPN2)  right lung adenocarcinoma: -PET scan on 05/13/2019 showed right lower lobe nodule concerning for bronchogenic carcinoma. Cirrhotic liver. -Right lower lobectomy and lymph node excision on 06/19/2019. -Pathology showed 1.7 cm right lower lobe adenocarcinoma, unifocal, lymphovascular invasion present. Margins negative. 2/14 lymph nodes positive (level 7 and 10R). PT1CPN2. -PD-L1 30%, K-ras G12A, MSI stable. EGFR mutation not identified.   PLAN:  1.Stage III right lung adenocarcinoma: -We talked about the chemotherapy regimen and side effects in detail. -I have reviewed her labs today.  Platelet count is 107.  White count is normal.  LFTs are normal. -We will proceed with her first cycle today.  I plan to dose reduced by 20% given her history of liver disease. -She was told to continue folic acid tablet daily. -She will come back in 2 weeks with labs and will fluids.  2. Immune mediated thrombocytopenia: -She was treated for hepatitis C in the past and also has cirrhosis. -Platelet count today is 107.  We will closely monitor.  3.  Hypokalemia: -Potassium today 3.2.  We will give her potassium 40 mEq daily.  If it continues to be low, will start her on home supplements.   Orders placed this  encounter:  No orders of the defined types were placed in this encounter.    Derek Jack, MD [place] [phone]   I, Milinda Antis, am acting as a scribe for Dr. Sanda Linger.  I, Derek Jack MD, have reviewed the above documentation for accuracy and completeness, and I agree with the above.

## 2019-09-17 NOTE — Progress Notes (Signed)
Will give Potassium today per orders only. No other treatment or fluids at this time per MD.  Vitals stable and discharged home from clinic ambulatory. Follow up as scheduled.

## 2019-09-30 ENCOUNTER — Other Ambulatory Visit (HOSPITAL_COMMUNITY): Payer: Self-pay | Admitting: *Deleted

## 2019-09-30 ENCOUNTER — Encounter (HOSPITAL_COMMUNITY): Payer: Self-pay | Admitting: Hematology

## 2019-09-30 ENCOUNTER — Other Ambulatory Visit: Payer: Self-pay

## 2019-09-30 ENCOUNTER — Inpatient Hospital Stay (HOSPITAL_COMMUNITY): Payer: Medicaid Other

## 2019-09-30 ENCOUNTER — Inpatient Hospital Stay (HOSPITAL_BASED_OUTPATIENT_CLINIC_OR_DEPARTMENT_OTHER): Payer: Medicaid Other | Admitting: Hematology

## 2019-09-30 VITALS — BP 113/75 | HR 97 | Temp 97.3°F | Resp 18 | Wt 158.0 lb

## 2019-09-30 VITALS — BP 106/73 | HR 76 | Temp 97.2°F | Resp 18

## 2019-09-30 DIAGNOSIS — C3491 Malignant neoplasm of unspecified part of right bronchus or lung: Secondary | ICD-10-CM

## 2019-09-30 DIAGNOSIS — C3431 Malignant neoplasm of lower lobe, right bronchus or lung: Secondary | ICD-10-CM

## 2019-09-30 DIAGNOSIS — Z5111 Encounter for antineoplastic chemotherapy: Secondary | ICD-10-CM | POA: Diagnosis not present

## 2019-09-30 LAB — CBC WITH DIFFERENTIAL/PLATELET
Abs Immature Granulocytes: 0.04 10*3/uL (ref 0.00–0.07)
Basophils Absolute: 0 10*3/uL (ref 0.0–0.1)
Basophils Relative: 0 %
Eosinophils Absolute: 0 10*3/uL (ref 0.0–0.5)
Eosinophils Relative: 0 %
HCT: 36.5 % (ref 36.0–46.0)
Hemoglobin: 11.7 g/dL — ABNORMAL LOW (ref 12.0–15.0)
Immature Granulocytes: 1 %
Lymphocytes Relative: 22 %
Lymphs Abs: 1.9 10*3/uL (ref 0.7–4.0)
MCH: 26.9 pg (ref 26.0–34.0)
MCHC: 32.1 g/dL (ref 30.0–36.0)
MCV: 83.9 fL (ref 80.0–100.0)
Monocytes Absolute: 0.7 10*3/uL (ref 0.1–1.0)
Monocytes Relative: 8 %
Neutro Abs: 6.1 10*3/uL (ref 1.7–7.7)
Neutrophils Relative %: 69 %
Platelets: 262 10*3/uL (ref 150–400)
RBC: 4.35 MIL/uL (ref 3.87–5.11)
RDW: 15.6 % — ABNORMAL HIGH (ref 11.5–15.5)
WBC: 8.8 10*3/uL (ref 4.0–10.5)
nRBC: 0 % (ref 0.0–0.2)

## 2019-09-30 LAB — COMPREHENSIVE METABOLIC PANEL
ALT: 15 U/L (ref 0–44)
AST: 21 U/L (ref 15–41)
Albumin: 3.9 g/dL (ref 3.5–5.0)
Alkaline Phosphatase: 58 U/L (ref 38–126)
Anion gap: 11 (ref 5–15)
BUN: 5 mg/dL — ABNORMAL LOW (ref 6–20)
CO2: 23 mmol/L (ref 22–32)
Calcium: 9.2 mg/dL (ref 8.9–10.3)
Chloride: 97 mmol/L — ABNORMAL LOW (ref 98–111)
Creatinine, Ser: 0.44 mg/dL (ref 0.44–1.00)
GFR calc Af Amer: >60 mL/min (ref >60)
GFR calc non Af Amer: >60 mL/min (ref >60)
Glucose, Bld: 123 mg/dL — ABNORMAL HIGH (ref 70–99)
Potassium: 3.5 mmol/L (ref 3.5–5.1)
Sodium: 131 mmol/L — ABNORMAL LOW (ref 135–145)
Total Bilirubin: 0.4 mg/dL (ref 0.3–1.2)
Total Protein: 7.9 g/dL (ref 6.5–8.1)

## 2019-09-30 MED ORDER — PALONOSETRON HCL INJECTION 0.25 MG/5ML
0.2500 mg | Freq: Once | INTRAVENOUS | Status: AC
  Administered 2019-09-30: 0.25 mg via INTRAVENOUS

## 2019-09-30 MED ORDER — SODIUM CHLORIDE 0.9 % IV SOLN
10.0000 mg | Freq: Once | INTRAVENOUS | Status: AC
  Administered 2019-09-30: 10 mg via INTRAVENOUS
  Filled 2019-09-30: qty 10

## 2019-09-30 MED ORDER — SODIUM CHLORIDE 0.9 % IV SOLN
500.0000 mg/m2 | Freq: Once | INTRAVENOUS | Status: AC
  Administered 2019-09-30: 900 mg via INTRAVENOUS
  Filled 2019-09-30: qty 16

## 2019-09-30 MED ORDER — SODIUM CHLORIDE 0.9 % IV SOLN
500.0000 mg | Freq: Once | INTRAVENOUS | Status: AC
  Administered 2019-09-30: 500 mg via INTRAVENOUS
  Filled 2019-09-30: qty 50

## 2019-09-30 MED ORDER — TRAMADOL HCL 50 MG PO TABS: 50.0000 mg | ORAL_TABLET | Freq: Every day | ORAL | 0 refills | Status: DC | PRN

## 2019-09-30 MED ORDER — PALONOSETRON HCL INJECTION 0.25 MG/5ML
INTRAVENOUS | Status: AC
  Filled 2019-09-30: qty 5

## 2019-09-30 MED ORDER — SODIUM CHLORIDE 0.9% FLUSH
10.0000 mL | INTRAVENOUS | Status: DC | PRN
  Administered 2019-09-30: 10 mL

## 2019-09-30 MED ORDER — SODIUM CHLORIDE 0.9 % IV SOLN
150.0000 mg | Freq: Once | INTRAVENOUS | Status: AC
  Administered 2019-09-30: 150 mg via INTRAVENOUS
  Filled 2019-09-30: qty 150

## 2019-09-30 MED ORDER — HEPARIN SOD (PORK) LOCK FLUSH 100 UNIT/ML IV SOLN
500.0000 [IU] | Freq: Once | INTRAVENOUS | Status: AC | PRN
  Administered 2019-09-30: 500 [IU]

## 2019-09-30 MED ORDER — SODIUM CHLORIDE 0.9 % IV SOLN: Freq: Once | INTRAVENOUS | Status: AC

## 2019-09-30 NOTE — Progress Notes (Signed)
Patient was assessed by Dr. Katragadda and labs have been reviewed.  Patient is okay to proceed with treatment today. Primary RN and pharmacy aware.   

## 2019-09-30 NOTE — Patient Instructions (Addendum)
[place] at [facility] Discharge Instructions  You were seen today by Dr. Delton Coombes. He went over your recent results. You received your treatment today. Place ondansetron under the tongue as needed for nausea. Take 1 tablet of tramadol in the morning as needed for the back pain. Drink 1 can of Boost or Ensure daily to maintain your weight. Dr. Delton Coombes will see you back in 3 weeks for labs and follow up.   Thank you for choosing [place] at [facility] to provide your oncology and hematology care.  To afford each patient quality time with our provider, please arrive at least 15 minutes before your scheduled appointment time.   If you have a lab appointment with the [facility] please come in thru the Main Entrance and check in at the main information desk  You need to re-schedule your appointment should you arrive 10 or more minutes late.  We strive to give you quality time with our providers, and arriving late affects you and other patients whose appointments are after yours.  Also, if you no show three or more times for appointments you may be dismissed from the clinic at the providers discretion.     Again, thank you for choosing [facility].  Our hope is that these requests will decrease the amount of time that you wait before being seen by our physicians.       _____________________________________________________________  Should you have questions after your visit to [facility], please contact our office at [phone] between the hours of 8:00 a.m. and 4:30 p.m.  Voicemails left after 4:00 p.m. will not be returned until the following business day.  For prescription refill requests, have your pharmacy contact our office and allow 72 hours.    Cancer Center Support Programs:   > Cancer Support Group  2nd Tuesday of the month 1pm-2pm, Journey Room

## 2019-09-30 NOTE — Progress Notes (Signed)
Rachel Gross, [place] [id number]   CLINIC:  Medical Oncology/Hematology  PCP:  Celene Squibb, MD [address] [name] [address] [phone]   REASON FOR VISIT:  Follow-up for stage III right lung cancer  PRIOR THERAPY: Right lower lobectomy on 06/19/2019  NGS Results: PD-L1 30%, MS--stable, 9 Muts/Mb  CURRENT THERAPY: Carboplatin and pemetrexed every 3 weeks  BRIEF ONCOLOGIC HISTORY:  Oncology History  Adenocarcinoma of lung, stage 3, right ([facility])  07/02/2019 Initial Diagnosis   Adenocarcinoma of lung, stage 3, right ([facility])   07/18/2019 Genetic Testing   Foundation One     07/23/2019 Genetic Testing   PD-L1     09/09/2019 -  Chemotherapy   The patient had palonosetron (ALOXI) injection 0.25 mg, 0.25 mg, Intravenous,  Once, 1 of 4 cycles Administration: 0.25 mg (09/09/2019) PEMEtrexed (ALIMTA) 700 mg in sodium chloride 0.9 % 100 mL chemo infusion, 400 mg/m2 = 700 mg (80 % of original dose 500 mg/m2), Intravenous,  Once, 1 of 4 cycles Dose modification: 400 mg/m2 (80 % of original dose 500 mg/m2, Cycle 1, Reason: Provider Judgment) Administration: 700 mg (09/09/2019) CARBOplatin (PARAPLATIN) 480 mg in sodium chloride 0.9 % 250 mL chemo infusion, 480 mg (80 % of original dose 601.5 mg), Intravenous,  Once, 1 of 4 cycles Dose modification:   (original dose 601.5 mg, Cycle 1), 481.2 mg (80 % of original dose 601.5 mg, Cycle 1, Reason: Provider Judgment),   (original dose 601.5 mg, Cycle 1) Administration: 480 mg (09/09/2019) fosaprepitant (EMEND) 150 mg in sodium chloride 0.9 % 145 mL IVPB, 150 mg, Intravenous,  Once, 1 of 4 cycles Administration: 150 mg (09/09/2019)  for chemotherapy treatment.      CANCER STAGING: Cancer Staging No matching staging information was found for the patient.  INTERVAL HISTORY:  Rachel Gross, a 54 y.o. female, returns for routine follow-up and consideration for next cycle of chemotherapy. [name]  was last seen on 09/17/2019.  Due for cycle #2 of carboplatin and pemetrexed today.   Overall, she tells me she has been feeling pretty well. She reports that she felt nauseated twice after the previous treatment, but denies vomiting and she did not take Zofran for it. She slept the entire day after her previous chemo. Her fatigue is stable and she takes her folic acid daily. Her appetite is okay, though she does not eat much.  Overall, she feels ready for next cycle of chemo today.    REVIEW OF SYSTEMS:  Review of Systems  Constitutional: Positive for appetite change (moderately decreased) and fatigue (moderate).  Gastrointestinal: Positive for constipation (2 BM's per week) and nausea (occasional).  Musculoskeletal: Positive for back pain (back stiffness w/ walking) and myalgias (cramping in hands).  All other systems reviewed and are negative.   PAST MEDICAL/SURGICAL HISTORY:  Past Medical History:  Diagnosis Date  . Alcoholic hepatitis without ascites   . Anxiety   . Arthritis    knees, hands  . 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 Bipolar disorder ([facility])   . Cancer ([facility])    right lung  . COPD (chronic obstructive pulmonary disease) ([facility])   . Depression   . Dyspnea   . Emphysema lung ([facility])   . GERD (gastroesophageal reflux disease)   . Hepatitis C    s/p treatment with Epclusa  . History of HPV infection   . History of pneumonia   . Pneumonia   .  Smoker    Past Surgical History:  Procedure Laterality Date  . BIOPSY  05/02/2018   Procedure: BIOPSY;  Surgeon: Daneil Dolin, MD;  [facility];  Service: Endoscopy;;  gastric  . CESAREAN SECTION    . CHEST TUBE INSERTION Right 06/26/2019   Procedure: Right CHEST TUBE REPLACEMENT;  Surgeon: [name], MD;  Location: [facility];  Service: Thoracic;  Laterality: Right;  . COLONOSCOPY WITH PROPOFOL N/A 03/03/2019   Procedure: COLONOSCOPY WITH PROPOFOL;  Surgeon: Daneil Dolin,  MD;  [facility];  Service: Endoscopy;  Laterality: N/A;  9:15am  . ESOPHAGOGASTRODUODENOSCOPY (EGD) WITH PROPOFOL N/A 05/02/2018   Procedure: ESOPHAGOGASTRODUODENOSCOPY (EGD) WITH PROPOFOL;  Surgeon: Daneil Dolin, MD;  [facility];  Service: Endoscopy;  Laterality: N/A;  8:30am  . EYE SURGERY Bilateral    cataract  . HEMOSTASIS CLIP PLACEMENT  03/03/2019   Procedure: HEMOSTASIS CLIP PLACEMENT;  Surgeon: Daneil Dolin, MD;  [facility];  Service: Endoscopy;;  . INTERCOSTAL NERVE BLOCK Right 06/19/2019   Procedure: Intercostal Nerve Block;  Surgeon: [name], MD;  Location: [facility];  Service: Thoracic;  Laterality: Right;  . IR US GUIDE VASC ACCESS RIGHT  03/13/2018  . LOBECTOMY    . LYMPH NODE DISSECTION Right 06/19/2019   Procedure: Lymph Node Dissection;  Surgeon: [name], MD;  Location: [facility];  Service: Thoracic;  Laterality: Right;  . POLYPECTOMY  03/03/2019   Procedure: POLYPECTOMY;  Surgeon: Daneil Dolin, MD;  [facility];  Service: Endoscopy;;  . PORTACATH PLACEMENT Left 09/08/2019   Procedure: INSERTION PORT-A-CATH LEFT CHEST (attached catheter in left subclavian);  Surgeon: Aviva Signs, MD;  Location: [facility];  Service: General;  Laterality: Left;  . TONSILLECTOMY    . TOOTH EXTRACTION  01/31/2018   x 7  . VIDEO BRONCHOSCOPY WITH INSERTION OF INTERBRONCHIAL VALVE (IBV) N/A 06/26/2019   Procedure: VIDEO BRONCHOSCOPY WITH INSERTION OF TWO INTERBRONCHIAL VALVE (IBV);  Surgeon: [name], MD;  [facility];  Service: Thoracic;  Laterality: N/A;  . VIDEO BRONCHOSCOPY WITH INSERTION OF INTERBRONCHIAL VALVE (IBV) N/A 08/08/2019   Procedure: VIDEO BRONCHOSCOPY WITH REMOVAL OF INTERBRONCHIAL VALVE (IBV);  Surgeon: [name], MD;  Location: [facility] OR;  Service: Thoracic;  Laterality: N/A;    SOCIAL HISTORY:  Social History   Socioeconomic History  . Marital status: Single    Spouse name: Not on  file  . Number of children: Not on file  . Years of education: Not on file  . Highest education level: Not on file  Occupational History  . Not on file  Tobacco Use  . Smoking status: Former Smoker    Packs/day: 0.50    Years: 38.00    Pack years: 19.00    Types: Cigarettes    Quit date: 06/19/2019    Years since quitting: 0.2  . Smokeless tobacco: Never Used  Vaping Use  . Vaping Use: Never used  Substance and Sexual Activity  . Alcohol use: Not Currently    Comment: Last use of alcohol 03/2016  . 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 Sexual activity: Not on file  Other Topics Concern  . Not on file  Social History Narrative  . Not on file   Social Determinants of Health   Financial Resource Strain:   . Difficulty of Paying Living Expenses: Not on file  Food  Insecurity:   . Worried About Charity fundraiser in the Last Year: Not on file  . Ran Out of Food in the Last Year: Not on file  Transportation Needs:   . Lack of Transportation (Medical): Not on file  . Lack of Transportation (Non-Medical): Not on file  Physical Activity:   . Days of Exercise per Week: Not on file  . Minutes of Exercise per Session: Not on file  Stress:   . Feeling of Stress : Not on file  Social Connections:   . Frequency of Communication with Friends and Family: Not on file  . Frequency of Social Gatherings with Friends and Family: Not on file  . Attends Religious Services: Not on file  . Active Member of Clubs or Organizations: Not on file  . Attends Archivist Meetings: Not on file  . Marital Status: Not on file  Intimate Partner Violence:   . Fear of Current or Ex-Partner: Not on file  . Emotionally Abused: Not on file  . Physically Abused: Not on file  . Sexually Abused: Not on file    FAMILY HISTORY:  Family History  Problem Relation Age of Onset  . Congenital heart disease Mother   . Bipolar disorder Mother   . Prostate cancer Brother   .  Alzheimer's disease Paternal Grandmother   . Colon cancer Neg Hx     CURRENT MEDICATIONS:  Current Outpatient Medications  Medication Sig Dispense Refill  . albuterol (VENTOLIN HFA) 108 (90 Base) MCG/ACT inhaler Inhale 2 puffs into the lungs every 4 (four) hours as needed for wheezing or shortness of breath.     . diphenhydrAMINE (BENADRYL) 25 MG tablet Take 25 mg by mouth at bedtime.     . divalproex (DEPAKOTE) 250 MG DR tablet Take 250 mg by mouth at bedtime.     . lidocaine-prilocaine (EMLA) cream Apply a small amount to port a cath site and cover with plastic wrap 1 hour prior to chemotherapy appointments 30 g 3  . pantoprazole (PROTONIX) 40 MG tablet Take one tablet before breakfast daily. (Patient taking differently: Take 40 mg by mouth daily before breakfast. ) 90 tablet 3  . risperiDONE (RISPERDAL) 2 MG tablet Take 2 mg by mouth at bedtime.     Marland Kitchen scopolamine (TRANSDERM-SCOP) 1 MG/3DAYS Place 1 patch (1.5 mg total) onto the skin every 3 (three) days. 10 patch 12  . SYMBICORT 160-4.5 MCG/ACT inhaler Inhale 2 puffs into the lungs 2 (two) times daily.    . traMADol (ULTRAM) 50 MG tablet Take 1 tablet (50 mg total) by mouth every 6 (six) hours as needed. 20 tablet 0  . ondansetron (ZOFRAN ODT) 8 MG disintegrating tablet Place 1 tablet under tongue every 8 hours as needed for nausea. You may begin using this medication on the third day after each chemotherapy treatment. (Patient not taking: Reported on 09/17/2019) 20 tablet 1   No current facility-administered medications for this visit.    ALLERGIES:  Allergies  Allergen Reactions  . Folic Acid Anaphylaxis    Not entirely clear if it is from folic acid. She is taking folic acid now and doesn't have the throat swelling anymore  . Penicillins Anaphylaxis    Throat swelled Did it involve swelling of the face/tongue/throat, SOB, or low BP? Yes Did it involve sudden or severe rash/hives, skin peeling, or any reaction on the inside of your  mouth or nose? No Did you need to seek medical attention at a hospital or  doctor's office? No When did it last happen?childhood allergy If all above answers are "NO", may proceed with cephalosporin use.   Marland Kitchen Amoxicillin     hallucinations Did it involve swelling of the face/tongue/throat, SOB, or low BP? No Did it involve sudden or severe rash/hives, skin peeling, or any reaction on the inside of your mouth or nose? No Did you need to seek medical attention at a hospital or doctor's office? Yes When did it last happen?July 2019 If all above answers are "NO", may proceed with cephalosporin use.   . Fish Allergy Nausea Only  . Other Swelling    Patient states that the sunrise brand folic acid made her feel like her throat was swelling.     PHYSICAL EXAM:  Performance status (ECOG): 1 - Symptomatic but completely ambulatory  Vitals:   09/30/19 0931  BP: 113/75  Pulse: 97  Resp: 18  Temp: (!) 97.3 F (36.3 C)  SpO2: 96%   Wt Readings from Last 3 Encounters:  09/30/19 158 lb (71.7 kg)  09/17/19 160 lb 12.8 oz (72.9 kg)  09/09/19 164 lb 9.6 oz (74.7 kg)   Physical Exam Vitals reviewed.  Constitutional:      Appearance: Normal appearance.  Cardiovascular:     Rate and Rhythm: Normal rate and regular rhythm.     Pulses: Normal pulses.     Heart sounds: Normal heart sounds.  Pulmonary:     Effort: Pulmonary effort is normal.     Breath sounds: Normal breath sounds.  Neurological:     General: No focal deficit present.     Mental Status: She is alert and oriented to person, place, and time.  Psychiatric:        Mood and Affect: Mood normal.        Behavior: Behavior normal.     LABORATORY DATA:  I have reviewed the labs as listed.  CBC Latest Ref Rng & Units 09/30/2019 09/17/2019 09/09/2019  WBC 4.0 - 10.5 K/uL 8.8 4.5 5.4  Hemoglobin 12.0 - 15.0 g/dL 11.7(L) 11.3(L) 12.5  Hematocrit 36 - 46 % 36.5 35.6(L) 39.3  Platelets 150 - 400 K/uL 262 107(L) 135(L)    CMP Latest Ref Rng & Units 09/30/2019 09/17/2019 09/09/2019  Glucose 70 - 99 mg/dL 123(H) 166(H) 133(H)  BUN 6 - 20 mg/dL 5(L) 8 7  Creatinine 0.44 - 1.00 mg/dL 0.44 0.53 0.69  Sodium 135 - 145 mmol/L 131(L) 131(L) 133(L)  Potassium 3.5 - 5.1 mmol/L 3.5 3.2(L) 3.6  Chloride 98 - 111 mmol/L 97(L) 97(L) 99  CO2 22 - 32 mmol/L 23 24 21(L)  Calcium 8.9 - 10.3 mg/dL 9.2 8.9 8.8(L)  Total Protein 6.5 - 8.1 g/dL 7.9 7.1 7.0  Total Bilirubin 0.3 - 1.2 mg/dL 0.4 0.4 0.4  Alkaline Phos 38 - 126 U/L 58 57 56  AST 15 - 41 U/L 21 38 17  ALT 0 - 44 U/L 15 39 12    DIAGNOSTIC IMAGING:  I have independently reviewed the scans and discussed with the patient. DG Lumbar Spine Complete  Result Date: 09/05/2019 CLINICAL DATA:  Right flank pain for 1 week. History of lung cancer. EXAM: LUMBAR SPINE - COMPLETE 4+ VIEW COMPARISON:  PET-CT 05/13/2019. FINDINGS: There are 5 non rib-bearing lumbar type vertebrae. Vertebral alignment is normal. No fracture is identified. There is mild disc space narrowing from L3-4 to L5-S1. Small calcifications in the pelvis likely represent phleboliths. IMPRESSION: Mild lumbar disc degeneration.  No acute osseous abnormality. Electronically Signed  By: Logan Bores M.D.   On: 09/05/2019 11:06   DG Chest Port 1 View  Result Date: 09/08/2019 CLINICAL DATA:  Port-A-Cath placement. EXAM: PORTABLE CHEST 1 VIEW COMPARISON:  08/05/2019 FINDINGS: Left subclavian power port is in good position with its tip in the distal SVC. No complicating features are identified. The cardiac silhouette, mediastinal and hilar contours are within normal limits and stable. Stable loss of volume in the right hemithorax with basilar scarring changes. No acute pulmonary findings. IMPRESSION: Left subclavian power port in good position without complicating features. Electronically Signed   By: Marijo Sanes M.D.   On: 09/08/2019 10:04   DG C-Arm 1-60 Min-No Report  Result Date: 09/08/2019 Fluoroscopy was  utilized by the requesting physician.  No radiographic interpretation.   CT Renal Stone Study  Result Date: 09/05/2019 CLINICAL DATA:  RIGHT flank pain EXAM: CT ABDOMEN AND PELVIS WITHOUT CONTRAST TECHNIQUE: Multidetector CT imaging of the abdomen and pelvis was performed following the standard protocol without IV contrast. COMPARISON:  March 13, 2018, May 01, 2019. FINDINGS: Lower chest: There is a small to moderate loculated RIGHT pleural effusion with a heterogeneous density likely reflecting postsurgical changes/debris. RIGHT lower lung atelectasis. Coronary artery atherosclerotic calcifications. No significant pericardial effusion. Hepatobiliary: Nodular contour of the liver consistent with underlying cirrhosis. Gallbladder is unremarkable. No intrahepatic or extrahepatic biliary ductal dilation Pancreas: Unremarkable. No pancreatic ductal dilatation or surrounding inflammatory changes. Spleen: Spleen is the upper limits of normal in size. Adrenals/Urinary Tract: Adrenals are unremarkable. Nonobstructive LEFT-sided nephrolithiasis. No hydronephrosis. Bladder is unremarkable. Stomach/Bowel: Stomach is within normal limits. Appendix appears normal. No evidence of bowel wall thickening, distention, or inflammatory changes. Diverticulosis. Revisualization of duodenal lipoma, unchanged. Vascular/Lymphatic: Moderate to severe atherosclerotic calcifications of the aorta. Reproductive: Fibroid uterus.  No suspicious adnexal masses. Other: No ascites. Musculoskeletal: Unchanged nondisplaced RIGHT-sided rib fracture of the RIGHT lateral seventh rib. Unchanged nondisplaced fracture of the RIGHT lateral eighth rib. Remote rib fractures of the lateral RIGHT sixth and fifth ribs. IMPRESSION: 1. No acute intra-abdominal findings to explain the patient's right flank pain. 2. Small to moderate loculated RIGHT pleural effusion with internal mildly heterogeneous density likely reflecting postsurgical changes/debris. There  may be a sub pulmonic component. 3. Cirrhosis. 4. Nonobstructive LEFT-sided nephrolithiasis. Aortic Atherosclerosis (ICD10-I70.0). Electronically Signed   By: Valentino Saxon MD   On: 09/05/2019 12:28     ASSESSMENT:  1.Stage IIIa (PT1CPN2) right lung adenocarcinoma: -PET scan on 05/13/2019 showed right lower lobe nodule concerning for bronchogenic carcinoma. Cirrhotic liver. -Right lower lobectomy and lymph node excision on 06/19/2019. -Pathology showed 1.7 cm right lower lobe adenocarcinoma, unifocal, lymphovascular invasion present. Margins negative. 2/14 lymph nodes positive (level 7 and 10R). PT1CPN2. -PD-L1 30%, K-ras G12A, MSI stable. EGFR mutation not identified. -Cycle 1 of carboplatin and pemetrexed on 09/09/2019.   PLAN:  1.Stage III right lung adenocarcinoma: -She has tolerated cycle 1 reasonably well.  She had nausea for couple of days and tired for 1 day after chemotherapy. -Reviewed her labs today which are grossly within normal limits.  LFTs are normal.  Platelet count improved to 62. -She may proceed with cycle 2.  I will increase pemetrexed to normal dose.  We will Cap out carboplatin dose at 500 mg.  2. Immune mediated thrombocytopenia: -She was treated for hep C in the past and also has cirrhosis.  Platelet count today is normal.  3.  Hypokalemia: -Potassium today is 3.5.  No supplementation required.  4.  Weight loss: -She lost  about 6 pounds since last visit.  She was told to drink boost daily.  5.  Diffuse body pains and back pain: -She reported diffuse body pains worse since chemotherapy.  We will give her tramadol 50 mg to be taken in the mornings as needed.  Orders placed this encounter:  No orders of the defined types were placed in this encounter.    Derek Jack, MD [place] [phone]   I, Milinda Antis, am acting as a scribe for Dr. Sanda Linger.  I, Derek Jack MD, have reviewed the above  documentation for accuracy and completeness, and I agree with the above.

## 2019-09-30 NOTE — Progress Notes (Signed)
Rachel Gross presents today for D1C2 Alimta/Carboplatin. Pt denies any new changes or symptoms since last treatment. Lab results and vitals have been reviewed and are stable and within parameters for treatment. Patient has been assessed by Dr. Delton Coombes who has approved proceeding with treatment today as planned.  Infusions tolerated without incident or complaint. VSS upon completion of treatment. Port flushed and deaccessed per protocol, see MAR and IV flowsheet for details. Discharged in satisfactory condition with follow up instructions.

## 2019-09-30 NOTE — Patient Instructions (Signed)
[facility] Discharge Instructions for Patients Receiving Chemotherapy   Beginning January 23rd 2017 lab work for the [facility] will be done in the  Main lab at [facility] on 1st floor. If you have a lab appointment with the [facility] please come in thru the  Main Entrance and check in at the main information desk   Today you received the following chemotherapy agents Alimta and Carboplatin  To help prevent nausea and vomiting after your treatment, we encourage you to take your nausea medication   If you develop nausea and vomiting, or diarrhea that is not controlled by your medication, call the clinic.  The clinic phone number is [phone]. Office hours are Monday-Friday 8:30am-5:00pm.  BELOW ARE SYMPTOMS THAT SHOULD BE REPORTED IMMEDIATELY:  *FEVER GREATER THAN 101.0 F  *CHILLS WITH OR WITHOUT FEVER  NAUSEA AND VOMITING THAT IS NOT CONTROLLED WITH YOUR NAUSEA MEDICATION  *UNUSUAL SHORTNESS OF BREATH  *UNUSUAL BRUISING OR BLEEDING  TENDERNESS IN MOUTH AND THROAT WITH OR WITHOUT PRESENCE OF ULCERS  *URINARY PROBLEMS  *BOWEL PROBLEMS  UNUSUAL RASH Items with * indicate a potential emergency and should be followed up as soon as possible. If you have an emergency after office hours please contact your primary care physician or go to the nearest emergency department.  Please call the clinic during office hours if you have any questions or concerns.   You may also contact the Patient Navigator at [phone] should you have any questions or need assistance in obtaining follow up care.      Resources For Cancer Patients and their Caregivers ? American Cancer Society: Can assist with transportation, wigs, general needs, runs Look Good Feel Better.        (202)504-3888 ? Cancer Care: Provides financial assistance, online support groups, medication/co-pay assistance.  1-800-813-HOPE 828 153 5723) ? [facility] Assists  [place] cancer patients and their families through emotional , educational and financial support.  709-721-5019 ? [place] DSS Where to apply for food stamps, Medicaid and utility assistance. 310-331-4254 ? [facility]: Transportation to medical appointments. 206-432-2389 ? Social Security Administration: May apply for disability if have a Stage IV cancer. ([phone] 434-373-1909 ? [employer], Disability and Transit Services: Assists with nutrition, care and transit needs. 989-793-6672

## 2019-09-30 NOTE — Patient Instructions (Signed)
[facility] at [facility] Discharge Instructions  Labs drawn from portacath   Thank you for choosing [place] [facility] to provide your oncology and hematology care.  To afford each patient quality time with our provider, please arrive at least 15 minutes before your scheduled appointment time.   If you have a lab appointment with the [facility] please come in thru the Main Entrance and check in at the main information desk.  You need to re-schedule your appointment should you arrive 10 or more minutes late.  We strive to give you quality time with our providers, and arriving late affects you and other patients whose appointments are after yours.  Also, if you no show three or more times for appointments you may be dismissed from the clinic at the providers discretion.     Again, thank you for choosing [facility].  Our hope is that these requests will decrease the amount of time that you wait before being seen by our physicians.       _____________________________________________________________  Should you have questions after your visit to [facility], please contact our office at [phone] and follow the prompts.  Our office hours are 8:00 a.m. and 4:30 p.m. Monday - Friday.  Please note that voicemails left after 4:00 p.m. may not be returned until the following business day.  We are closed weekends and major holidays.  You do have access to a nurse 24-7, just call the main number to the clinic [phone] and do not press any options, hold on the line and a nurse will answer the phone.    For prescription refill requests, have your pharmacy contact our office and allow 72 hours.    Due to Covid, you will need to wear a mask upon entering the hospital. If you do not have a mask, a mask will be given to you at the Main Entrance upon arrival. For doctor visits, patients may have 1 support person age 64 or  older with them. For treatment visits, patients can not have anyone with them due to social distancing guidelines and our immunocompromised population.

## 2019-09-30 NOTE — Progress Notes (Signed)
Confirmed chemotherapy doses:  Alimta 500 mg/m2 and maintain carboplatin at 500 mg (AUC 4.5)  T.O. Dr Rhys Martini, PharmD

## 2019-10-03 ENCOUNTER — Telehealth (HOSPITAL_COMMUNITY): Payer: Self-pay

## 2019-10-03 NOTE — Telephone Encounter (Signed)
Nutrition Assessment:  Patient identified on Malnutrition Screening report for weight loss and poor appetite.   54 year old female with right lung cancer.  Past medical history of lobectomy in 05/2019, etoh hepatitis, GERd, Hep C, COPD, depression, bipolar.  Patient receiving chemotherapy.  Spoke with patient via phone to introduce self and service at [facility].  Patient reports that she does not have much of an appetite since the start of chemotherapy. Also reports taste alterations.  Has had some nausea but has not vomited.  Reports that she tried some oral nutrition supplement shakes but does not like the vanilla flavor.  Typically eats cereal for breakfast.  Skips lunch and last night ate pinto beans for supper. Does not eat eggs or peanut butter.  Cheese makes her constipated.  Reports has issues with diarrhea and constipation.  States that prune juice helps relieve constipation.      Medications: zofran, protonix  Labs: reviewed  Anthropometrics:   Height: 61 inches Weight: 158  lb 8/31 7/21 161 lb 6/24 169 lb BMI: 29  7% weight loss in the last 2 months, signficant   Estimated Energy Needs  Kcals: 1800-2160 Protein: 90-108 g Fluid: > 1.8 L  NUTRITION DIAGNOSIS: Inadequate oral intake related to cancer and cancer related treatment side effects as evidenced by 7% weight loss and poor appetite.   INTERVENTION:  Discussed ways to add calories and protein in diet.  Encouraged being proactive with taking nausea medications Patient has contact information.  Declined follow-up visit at this time and prefers to call RD with future questions or concerns     NEXT VISIT: no follow-up, patient to call RD if needed  Drayden Lukas B. Zenia Resides, [place], [place] Registered Dietitian [phone] (mobile)

## 2019-10-21 ENCOUNTER — Other Ambulatory Visit: Payer: Self-pay

## 2019-10-21 ENCOUNTER — Telehealth: Payer: Self-pay | Admitting: Internal Medicine

## 2019-10-21 ENCOUNTER — Inpatient Hospital Stay (HOSPITAL_COMMUNITY): Payer: Medicaid Other

## 2019-10-21 ENCOUNTER — Encounter (HOSPITAL_COMMUNITY): Payer: Self-pay | Admitting: Hematology

## 2019-10-21 ENCOUNTER — Inpatient Hospital Stay (HOSPITAL_BASED_OUTPATIENT_CLINIC_OR_DEPARTMENT_OTHER): Payer: Medicaid Other | Admitting: Hematology

## 2019-10-21 ENCOUNTER — Inpatient Hospital Stay (HOSPITAL_COMMUNITY): Payer: Medicaid Other | Attending: Hematology

## 2019-10-21 ENCOUNTER — Other Ambulatory Visit (HOSPITAL_COMMUNITY): Payer: Self-pay | Admitting: *Deleted

## 2019-10-21 VITALS — BP 117/55 | HR 76 | Temp 98.2°F | Resp 18

## 2019-10-21 VITALS — BP 111/54 | HR 83 | Temp 96.9°F | Resp 20 | Wt 160.5 lb

## 2019-10-21 DIAGNOSIS — C3431 Malignant neoplasm of lower lobe, right bronchus or lung: Secondary | ICD-10-CM | POA: Diagnosis present

## 2019-10-21 DIAGNOSIS — C3491 Malignant neoplasm of unspecified part of right bronchus or lung: Secondary | ICD-10-CM

## 2019-10-21 DIAGNOSIS — Z5111 Encounter for antineoplastic chemotherapy: Secondary | ICD-10-CM | POA: Diagnosis not present

## 2019-10-21 LAB — CBC WITH DIFFERENTIAL/PLATELET
Abs Immature Granulocytes: 0.02 10*3/uL (ref 0.00–0.07)
Basophils Absolute: 0 10*3/uL (ref 0.0–0.1)
Basophils Relative: 0 %
Eosinophils Absolute: 0 10*3/uL (ref 0.0–0.5)
Eosinophils Relative: 0 %
HCT: 38 % (ref 36.0–46.0)
Hemoglobin: 12.1 g/dL (ref 12.0–15.0)
Immature Granulocytes: 0 %
Lymphocytes Relative: 29 %
Lymphs Abs: 1.5 10*3/uL (ref 0.7–4.0)
MCH: 27.6 pg (ref 26.0–34.0)
MCHC: 31.8 g/dL (ref 30.0–36.0)
MCV: 86.6 fL (ref 80.0–100.0)
Monocytes Absolute: 0.4 10*3/uL (ref 0.1–1.0)
Monocytes Relative: 8 %
Neutro Abs: 3.2 10*3/uL (ref 1.7–7.7)
Neutrophils Relative %: 63 %
Platelets: 167 10*3/uL (ref 150–400)
RBC: 4.39 MIL/uL (ref 3.87–5.11)
RDW: 19.1 % — ABNORMAL HIGH (ref 11.5–15.5)
WBC: 5.1 10*3/uL (ref 4.0–10.5)
nRBC: 0 % (ref 0.0–0.2)

## 2019-10-21 LAB — COMPREHENSIVE METABOLIC PANEL
ALT: 27 U/L (ref 0–44)
AST: 28 U/L (ref 15–41)
Albumin: 3.9 g/dL (ref 3.5–5.0)
Alkaline Phosphatase: 66 U/L (ref 38–126)
Anion gap: 10 (ref 5–15)
BUN: 5 mg/dL — ABNORMAL LOW (ref 6–20)
CO2: 25 mmol/L (ref 22–32)
Calcium: 9.1 mg/dL (ref 8.9–10.3)
Chloride: 102 mmol/L (ref 98–111)
Creatinine, Ser: 0.51 mg/dL (ref 0.44–1.00)
GFR calc Af Amer: >60 mL/min (ref >60)
GFR calc non Af Amer: >60 mL/min (ref >60)
Glucose, Bld: 113 mg/dL — ABNORMAL HIGH (ref 70–99)
Potassium: 3.6 mmol/L (ref 3.5–5.1)
Sodium: 137 mmol/L (ref 135–145)
Total Bilirubin: 0.4 mg/dL (ref 0.3–1.2)
Total Protein: 7 g/dL (ref 6.5–8.1)

## 2019-10-21 LAB — PROTIME-INR
INR: 1 (ref 0.8–1.2)
Prothrombin Time: 12.7 seconds (ref 11.4–15.2)

## 2019-10-21 MED ORDER — TRAMADOL HCL 50 MG PO TABS
50.0000 mg | ORAL_TABLET | Freq: Every day | ORAL | 0 refills | Status: DC | PRN
Start: 2019-10-21 — End: 2022-01-26

## 2019-10-21 MED ORDER — SODIUM CHLORIDE 0.9 % IV SOLN
500.0000 mg/m2 | Freq: Once | INTRAVENOUS | Status: AC
  Administered 2019-10-21: 900 mg via INTRAVENOUS
  Filled 2019-10-21: qty 20

## 2019-10-21 MED ORDER — PALONOSETRON HCL INJECTION 0.25 MG/5ML
INTRAVENOUS | Status: AC
  Filled 2019-10-21: qty 5

## 2019-10-21 MED ORDER — SODIUM CHLORIDE 0.9 % IV SOLN: Freq: Once | INTRAVENOUS | Status: AC

## 2019-10-21 MED ORDER — SODIUM CHLORIDE 0.9 % IV SOLN
150.0000 mg | Freq: Once | INTRAVENOUS | Status: AC
  Administered 2019-10-21: 150 mg via INTRAVENOUS
  Filled 2019-10-21: qty 150

## 2019-10-21 MED ORDER — SODIUM CHLORIDE 0.9 % IV SOLN
10.0000 mg | Freq: Once | INTRAVENOUS | Status: AC
  Administered 2019-10-21: 10 mg via INTRAVENOUS
  Filled 2019-10-21: qty 10

## 2019-10-21 MED ORDER — CYANOCOBALAMIN 1000 MCG/ML IJ SOLN
1000.0000 ug | Freq: Once | INTRAMUSCULAR | Status: AC
  Administered 2019-10-21: 1000 ug via INTRAMUSCULAR

## 2019-10-21 MED ORDER — CYANOCOBALAMIN 1000 MCG/ML IJ SOLN
INTRAMUSCULAR | Status: AC
  Filled 2019-10-21: qty 1

## 2019-10-21 MED ORDER — HEPARIN SOD (PORK) LOCK FLUSH 100 UNIT/ML IV SOLN
500.0000 [IU] | Freq: Once | INTRAVENOUS | Status: AC | PRN
  Administered 2019-10-21: 500 [IU]

## 2019-10-21 MED ORDER — SODIUM CHLORIDE 0.9% FLUSH
10.0000 mL | INTRAVENOUS | Status: DC | PRN
  Administered 2019-10-21: 10 mL

## 2019-10-21 MED ORDER — PALONOSETRON HCL INJECTION 0.25 MG/5ML
0.2500 mg | Freq: Once | INTRAVENOUS | Status: AC
  Administered 2019-10-21: 0.25 mg via INTRAVENOUS

## 2019-10-21 MED ORDER — SODIUM CHLORIDE 0.9 % IV SOLN
601.5000 mg | Freq: Once | INTRAVENOUS | Status: AC
  Administered 2019-10-21: 600 mg via INTRAVENOUS
  Filled 2019-10-21: qty 60

## 2019-10-21 NOTE — Progress Notes (Signed)
Confirmed doses of chemotherapy at full dose:  Alimta 500 mg/m2 and Carboplatin AUC 5  T.O. Dr Jim Desanctis, RN/Bert Ptacek Ronnald Ramp, PharmD

## 2019-10-21 NOTE — Progress Notes (Signed)
Patient was assessed by Dr. Katragadda and labs have been reviewed.  Patient is okay to proceed with treatment today. Primary RN and pharmacy aware.   

## 2019-10-21 NOTE — Progress Notes (Signed)
Patient presents today for treatment and follow up visit with Dr. Delton Coombes. Labs within parameters for treatment today. Vital signs within parameters for treatment.   Message received from [place] RN/ Dr. Delton Coombes. Proceed with treatment.   Treatment given today per MD orders. Tolerated infusion without adverse affects. Vital signs stable. No complaints at this time. Discharged from clinic ambulatory in stable condition. Alert and oriented x 3. F/U with [facility] as scheduled.

## 2019-10-21 NOTE — Patient Instructions (Signed)
[facility] at [facility] Discharge Instructions  Labs drawn from portacath today   Thank you for choosing [facility] at [facility] to provide your oncology and hematology care.  To afford each patient quality time with our provider, please arrive at least 15 minutes before your scheduled appointment time.   If you have a lab appointment with the Cancer Center please come in thru the Main Entrance and check in at the main information desk.  You need to re-schedule your appointment should you arrive 10 or more minutes late.  We strive to give you quality time with our providers, and arriving late affects you and other patients whose appointments are after yours.  Also, if you no show three or more times for appointments you may be dismissed from the clinic at the providers discretion.     Again, thank you for choosing [facility].  Our hope is that these requests will decrease the amount of time that you wait before being seen by our physicians.       _____________________________________________________________  Should you have questions after your visit to [facility], please contact our office at [phone] and follow the prompts.  Our office hours are 8:00 a.m. and 4:30 p.m. Monday - Friday.  Please note that voicemails left after 4:00 p.m. may not be returned until the following business day.  We are closed weekends and major holidays.  You do have access to a nurse 24-7, just call the main number to the clinic [phone] and do not press any options, hold on the line and a nurse will answer the phone.    For prescription refill requests, have your pharmacy contact our office and allow 72 hours.    Due to Covid, you will need to wear a mask upon entering the hospital. If you do not have a mask, a mask will be given to you at the Main Entrance upon arrival. For doctor visits, patients may have 1 support person age 18  or older with them. For treatment visits, patients can not have anyone with them due to social distancing guidelines and our immunocompromised population.     

## 2019-10-21 NOTE — Telephone Encounter (Signed)
Noted. Will forward to Walden Field as an [place]

## 2019-10-21 NOTE — Patient Instructions (Signed)
[facility] Discharge Instructions for Patients Receiving Chemotherapy  Today you received the following chemotherapy agents   To help prevent nausea and vomiting after your treatment, we encourage you to take your nausea medication   If you develop nausea and vomiting that is not controlled by your nausea medication, call the clinic.   BELOW ARE SYMPTOMS THAT SHOULD BE REPORTED IMMEDIATELY:  *FEVER GREATER THAN 100.5 F  *CHILLS WITH OR WITHOUT FEVER  NAUSEA AND VOMITING THAT IS NOT CONTROLLED WITH YOUR NAUSEA MEDICATION  *UNUSUAL SHORTNESS OF BREATH  *UNUSUAL BRUISING OR BLEEDING  TENDERNESS IN MOUTH AND THROAT WITH OR WITHOUT PRESENCE OF ULCERS  *URINARY PROBLEMS  *BOWEL PROBLEMS  UNUSUAL RASH Items with * indicate a potential emergency and should be followed up as soon as possible.  Feel free to call the clinic should you have any questions or concerns. The clinic phone number is [phone].  Please show the CHEMO ALERT CARD at check-in to the Emergency Department and triage nurse.   

## 2019-10-21 NOTE — Telephone Encounter (Signed)
Patient called to let us know she had her labs done at the cancer center and the results will be in epic

## 2019-10-21 NOTE — Patient Instructions (Signed)
[facility] at [facility] Discharge Instructions  You were seen today by Dr. Katragadda. He went over your recent results. You received your treatment today. Dr. Katragadda will see you back in 3 weeks for labs and follow up.   Thank you for choosing [facility] at [facility] to provide your oncology and hematology care.  To afford each patient quality time with our provider, please arrive at least 15 minutes before your scheduled appointment time.   If you have a lab appointment with the Cancer Center please come in thru the Main Entrance and check in at the main information desk  You need to re-schedule your appointment should you arrive 10 or more minutes late.  We strive to give you quality time with our providers, and arriving late affects you and other patients whose appointments are after yours.  Also, if you no show three or more times for appointments you may be dismissed from the clinic at the providers discretion.     Again, thank you for choosing [facility].  Our hope is that these requests will decrease the amount of time that you wait before being seen by our physicians.       _____________________________________________________________  Should you have questions after your visit to [facility], please contact our office at [phone] between the hours of 8:00 a.m. and 4:30 p.m.  Voicemails left after 4:00 p.m. will not be returned until the following business day.  For prescription refill requests, have your pharmacy contact our office and allow 72 hours.    Cancer Center Support Programs:   > Cancer Support Group  2nd Tuesday of the month 1pm-2pm, Journey Room    

## 2019-10-21 NOTE — Progress Notes (Signed)
[place],  [id number]   CLINIC:  Medical Oncology/Hematology  PCP:  Celene Squibb, MD [address] [name] [address] [phone]   REASON FOR VISIT:  Follow-up for stage III right lung cancer  PRIOR THERAPY: Right lower lobectomy on 06/19/2019  NGS Results: PD-L1 30%, MS--stable, 9 Muts/Mb  CURRENT THERAPY: Carboplatin and pemetrexed every 3 weeks  BRIEF ONCOLOGIC HISTORY:  Oncology History  Adenocarcinoma of lung, stage 3, right ([place])  07/02/2019 Initial Diagnosis   Adenocarcinoma of lung, stage 3, right ([place])   07/18/2019 Genetic Testing   Foundation One     07/23/2019 Genetic Testing   PD-L1     09/09/2019 -  Chemotherapy   The patient had palonosetron (ALOXI) injection 0.25 mg, 0.25 mg, Intravenous,  Once, 2 of 4 cycles Administration: 0.25 mg (09/09/2019), 0.25 mg (09/30/2019) PEMEtrexed (ALIMTA) 700 mg in sodium chloride 0.9 % 100 mL chemo infusion, 400 mg/m2 = 700 mg (80 % of original dose 500 mg/m2), Intravenous,  Once, 2 of 4 cycles Dose modification: 400 mg/m2 (80 % of original dose 500 mg/m2, Cycle 1, Reason: Provider Judgment) Administration: 700 mg (09/09/2019), 900 mg (09/30/2019) CARBOplatin (PARAPLATIN) 480 mg in sodium chloride 0.9 % 250 mL chemo infusion, 480 mg (80 % of original dose 601.5 mg), Intravenous,  Once, 2 of 4 cycles Dose modification:   (original dose 601.5 mg, Cycle 1), 481.2 mg (80 % of original dose 601.5 mg, Cycle 1, Reason: Provider Judgment),   (original dose 601.5 mg, Cycle 1),   (original dose 601.5 mg, Cycle 3),   (original dose 601.5 mg, Cycle 2) Administration: 480 mg (09/09/2019), 500 mg (09/30/2019) fosaprepitant (EMEND) 150 mg in sodium chloride 0.9 % 145 mL IVPB, 150 mg, Intravenous,  Once, 2 of 4 cycles Administration: 150 mg (09/09/2019), 150 mg (09/30/2019)  for chemotherapy treatment.      CANCER STAGING: Cancer Staging No matching staging information was found for the  patient.  INTERVAL HISTORY:  Ms. Melina Mosteller, a 54 y.o. female, returns for routine follow-up and consideration for next cycle of chemotherapy. Khrystina was last seen on 09/30/2019.  Due for cycle #3 of carboplatin and pemetrexed today.   Overall, she tells me she has been feeling good. She denies having mouth sores, though she is complaining of having intermittent difficulty swallowing liquids and solids for the last 3 weeks; she has to eat and drink slowly. Her appetite is good and she denies having N/V/D. She is drinking 1 can of Ensure daily.  Overall, she feels ready for next cycle of chemo today.    REVIEW OF SYSTEMS:  Review of Systems  Constitutional: Positive for appetite change (mildly decreased) and fatigue (mild).  HENT:   Positive for trouble swallowing (liquids & solids). Negative for mouth sores.   Respiratory: Positive for shortness of breath.   Gastrointestinal: Positive for constipation.  Musculoskeletal: Positive for back pain (7/10 lower back pain).  Neurological: Positive for headaches.  Psychiatric/Behavioral: Positive for sleep disturbance. The patient is nervous/anxious.   All other systems reviewed and are negative.   PAST MEDICAL/SURGICAL HISTORY:  Past Medical History:  Diagnosis Date  . Alcoholic hepatitis without ascites   . Anxiety   . Arthritis    knees, hands  . 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 Bipolar disorder ([place])   . Cancer ([place])    right lung  . COPD (chronic obstructive pulmonary disease) ([place])   .  Depression   . Dyspnea   . Emphysema lung ([place])   . GERD (gastroesophageal reflux disease)   . Hepatitis C    s/p treatment with Epclusa  . History of HPV infection   . History of pneumonia   . Pneumonia   . Smoker    Past Surgical History:  Procedure Laterality Date  . BIOPSY  05/02/2018   Procedure: BIOPSY;  Surgeon: Daneil Dolin, MD;  [facility];  Service: Endoscopy;;  gastric  .  CESAREAN SECTION    . CHEST TUBE INSERTION Right 06/26/2019   Procedure: Right CHEST TUBE REPLACEMENT;  Surgeon: [name], MD;  Location: [place];  Service: Thoracic;  Laterality: Right;  . COLONOSCOPY WITH PROPOFOL N/A 03/03/2019   Procedure: COLONOSCOPY WITH PROPOFOL;  Surgeon: Daneil Dolin, MD;  [facility];  Service: Endoscopy;  Laterality: N/A;  9:15am  . ESOPHAGOGASTRODUODENOSCOPY (EGD) WITH PROPOFOL N/A 05/02/2018   Procedure: ESOPHAGOGASTRODUODENOSCOPY (EGD) WITH PROPOFOL;  Surgeon: Daneil Dolin, MD;  [facility];  Service: Endoscopy;  Laterality: N/A;  8:30am  . EYE SURGERY Bilateral    cataract  . HEMOSTASIS CLIP PLACEMENT  03/03/2019   Procedure: HEMOSTASIS CLIP PLACEMENT;  Surgeon: Daneil Dolin, MD;  [facility];  Service: Endoscopy;;  . INTERCOSTAL NERVE BLOCK Right 06/19/2019   Procedure: Intercostal Nerve Block;  Surgeon: [name], MD;  Location: [place];  Service: Thoracic;  Laterality: Right;  . IR US GUIDE VASC ACCESS RIGHT  03/13/2018  . LOBECTOMY    . LYMPH NODE DISSECTION Right 06/19/2019   Procedure: Lymph Node Dissection;  Surgeon: [name], MD;  Location: [place];  Service: Thoracic;  Laterality: Right;  . POLYPECTOMY  03/03/2019   Procedure: POLYPECTOMY;  Surgeon: Daneil Dolin, MD;  [facility];  Service: Endoscopy;;  . PORTACATH PLACEMENT Left 09/08/2019   Procedure: INSERTION PORT-A-CATH LEFT CHEST (attached catheter in left subclavian);  Surgeon: Aviva Signs, MD;  Location: [facility];  Service: General;  Laterality: Left;  . TONSILLECTOMY    . TOOTH EXTRACTION  01/31/2018   x 7  . VIDEO BRONCHOSCOPY WITH INSERTION OF INTERBRONCHIAL VALVE (IBV) N/A 06/26/2019   Procedure: VIDEO BRONCHOSCOPY WITH INSERTION OF TWO INTERBRONCHIAL VALVE (IBV);  Surgeon: [name], MD;  Location: [facility] OR;  Service: Thoracic;  Laterality: N/A;  . VIDEO BRONCHOSCOPY WITH INSERTION OF INTERBRONCHIAL  VALVE (IBV) N/A 08/08/2019   Procedure: VIDEO BRONCHOSCOPY WITH REMOVAL OF INTERBRONCHIAL VALVE (IBV);  Surgeon: [name], MD;  Location: [facility] OR;  Service: Thoracic;  Laterality: N/A;    SOCIAL HISTORY:  Social History   Socioeconomic History  . Marital status: Single    Spouse name: Not on file  . Number of children: Not on file  . Years of education: Not on file  . Highest education level: Not on file  Occupational History  . Not on file  Tobacco Use  . Smoking status: Former Smoker    Packs/day: 0.50    Years: 38.00    Pack years: 19.00    Types: Cigarettes    Quit date: 06/19/2019    Years since quitting: 0.3  . Smokeless tobacco: Never Used  Vaping Use  . Vaping Use: Never used  Substance and Sexual Activity  . Alcohol use: Not Currently    Comment: Last use of alcohol 03/2016  . 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  Sexual activity: Not on file  Other Topics Concern  . Not on file  Social History Narrative  . Not on file   Social Determinants of Health   Financial Resource Strain:   . Difficulty of Paying Living Expenses: Not on file  Food Insecurity:   . Worried About Charity fundraiser in the Last Year: Not on file  . Ran Out of Food in the Last Year: Not on file  Transportation Needs:   . Lack of Transportation (Medical): Not on file  . Lack of Transportation (Non-Medical): Not on file  Physical Activity:   . Days of Exercise per Week: Not on file  . Minutes of Exercise per Session: Not on file  Stress:   . Feeling of Stress : Not on file  Social Connections:   . Frequency of Communication with Friends and Family: Not on file  . Frequency of Social Gatherings with Friends and Family: Not on file  . Attends Religious Services: Not on file  . Active Member of Clubs or Organizations: Not on file  . Attends Archivist Meetings: Not on file  . Marital Status: Not on file  Intimate Partner Violence:   . Fear  of Current or Ex-Partner: Not on file  . Emotionally Abused: Not on file  . Physically Abused: Not on file  . Sexually Abused: Not on file    FAMILY HISTORY:  Family History  Problem Relation Age of Onset  . Congenital heart disease Mother   . Bipolar disorder Mother   . Prostate cancer Brother   . Alzheimer's disease Paternal Grandmother   . Colon cancer Neg Hx     CURRENT MEDICATIONS:  Current Outpatient Medications  Medication Sig Dispense Refill  . albuterol (VENTOLIN HFA) 108 (90 Base) MCG/ACT inhaler Inhale 2 puffs into the lungs every 4 (four) hours as needed for wheezing or shortness of breath.     . diphenhydrAMINE (BENADRYL) 25 MG tablet Take 25 mg by mouth at bedtime.     . divalproex (DEPAKOTE) 250 MG DR tablet Take 250 mg by mouth at bedtime.     . lidocaine-prilocaine (EMLA) cream Apply a small amount to port a cath site and cover with plastic wrap 1 hour prior to chemotherapy appointments 30 g 3  . ondansetron (ZOFRAN ODT) 8 MG disintegrating tablet Place 1 tablet under tongue every 8 hours as needed for nausea. You may begin using this medication on the third day after each chemotherapy treatment. (Patient not taking: Reported on 09/17/2019) 20 tablet 1  . pantoprazole (PROTONIX) 40 MG tablet Take one tablet before breakfast daily. (Patient taking differently: Take 40 mg by mouth daily before breakfast. ) 90 tablet 3  . risperiDONE (RISPERDAL) 2 MG tablet Take 2 mg by mouth at bedtime.     Marland Kitchen scopolamine (TRANSDERM-SCOP) 1 MG/3DAYS Place 1 patch (1.5 mg total) onto the skin every 3 (three) days. 10 patch 12  . SYMBICORT 160-4.5 MCG/ACT inhaler Inhale 2 puffs into the lungs 2 (two) times daily.    . traMADol (ULTRAM) 50 MG tablet Take 1 tablet (50 mg total) by mouth daily as needed. 30 tablet 0   No current facility-administered medications for this visit.    ALLERGIES:  Allergies  Allergen Reactions  . Folic Acid Anaphylaxis    Not entirely clear if it is from folic  acid. She is taking folic acid now and doesn't have the throat swelling anymore  . Penicillins Anaphylaxis    Throat swelled  Did it involve swelling of the face/tongue/throat, SOB, or low BP? Yes Did it involve sudden or severe rash/hives, skin peeling, or any reaction on the inside of your mouth or nose? No Did you need to seek medical attention at a hospital or doctor's office? No When did it last happen?childhood allergy If all above answers are "NO", may proceed with cephalosporin use.   Marland Kitchen Amoxicillin     hallucinations Did it involve swelling of the face/tongue/throat, SOB, or low BP? No Did it involve sudden or severe rash/hives, skin peeling, or any reaction on the inside of your mouth or nose? No Did you need to seek medical attention at a hospital or doctor's office? Yes When did it last happen?July 2019 If all above answers are "NO", may proceed with cephalosporin use.   . Fish Allergy Nausea Only  . Other Swelling    Patient states that the sunrise brand folic acid made her feel like her throat was swelling.     PHYSICAL EXAM:  Performance status (ECOG): 1 - Symptomatic but completely ambulatory  Vitals:   10/21/19 0947  BP: (!) 111/54  Pulse: 83  Resp: 20  Temp: (!) 96.9 F (36.1 C)  SpO2: 99%   Wt Readings from Last 3 Encounters:  10/21/19 160 lb 8 oz (72.8 kg)  09/30/19 158 lb (71.7 kg)  09/17/19 160 lb 12.8 oz (72.9 kg)   Physical Exam Vitals reviewed.  Constitutional:      Appearance: Normal appearance. She is obese.  HENT:     Mouth/Throat:     Lips: No lesions.     Mouth: No oral lesions.     Tongue: No lesions.  Cardiovascular:     Rate and Rhythm: Normal rate and regular rhythm.     Pulses: Normal pulses.     Heart sounds: Normal heart sounds.  Pulmonary:     Effort: Pulmonary effort is normal.     Breath sounds: Normal breath sounds.  Chest:     Comments: Port-a-Cath in L chest Abdominal:     Palpations: Abdomen is soft.      Tenderness: There is no abdominal tenderness.  Musculoskeletal:     Right lower leg: No edema.     Left lower leg: No edema.  Neurological:     General: No focal deficit present.     Mental Status: She is alert and oriented to person, place, and time.  Psychiatric:        Mood and Affect: Mood normal.        Behavior: Behavior normal.     LABORATORY DATA:  I have reviewed the labs as listed.  CBC Latest Ref Rng & Units 10/21/2019 09/30/2019 09/17/2019  WBC 4.0 - 10.5 K/uL 5.1 8.8 4.5  Hemoglobin 12.0 - 15.0 g/dL 12.1 11.7(L) 11.3(L)  Hematocrit 36 - 46 % 38.0 36.5 35.6(L)  Platelets 150 - 400 K/uL 167 262 107(L)   CMP Latest Ref Rng & Units 10/21/2019 09/30/2019 09/17/2019  Glucose 70 - 99 mg/dL 113(H) 123(H) 166(H)  BUN 6 - 20 mg/dL 5(L) 5(L) 8  Creatinine 0.44 - 1.00 mg/dL 0.51 0.44 0.53  Sodium 135 - 145 mmol/L 137 131(L) 131(L)  Potassium 3.5 - 5.1 mmol/L 3.6 3.5 3.2(L)  Chloride 98 - 111 mmol/L 102 97(L) 97(L)  CO2 22 - 32 mmol/L 25 23 24   Calcium 8.9 - 10.3 mg/dL 9.1 9.2 8.9  Total Protein 6.5 - 8.1 g/dL 7.0 7.9 7.1  Total Bilirubin 0.3 - 1.2 mg/dL 0.4  0.4 0.4  Alkaline Phos 38 - 126 U/L 66 58 57  AST 15 - 41 U/L 28 21 38  ALT 0 - 44 U/L 27 15 39    DIAGNOSTIC IMAGING:  I have independently reviewed the scans and discussed with the patient. No results found.   ASSESSMENT:  1.Stage IIIa (PT1CPN2) right lung adenocarcinoma: -PET scan on 05/13/2019 showed right lower lobe nodule concerning for bronchogenic carcinoma. Cirrhotic liver. -Right lower lobectomy and lymph node excision on 06/19/2019. -Pathology showed 1.7 cm right lower lobe adenocarcinoma, unifocal, lymphovascular invasion present. Margins negative. 2/14 lymph nodes positive (level 7 and 10R). PT1CPN2. -PD-L1 30%, K-ras G12A, MSI stable. EGFR mutation not identified. -Cycle 1 of carboplatin and pemetrexed on 09/09/2019.   PLAN:  1.Stage III right lung adenocarcinoma: -She has tolerated cycle 2 very  well. -I reviewed her labs which showed normal LFTs and CBC. -I will increase carboplatin to AUC 5.  Pemetrexed will be full dose. -Continue folic acid daily.  She will receive B12 injection today. -She will come back in 3 weeks for final treatment.  2. Immune mediated thrombocytopenia: -She was treated for hep C in the past and also has cirrhosis.  Platelet count is normal.  3. Hypokalemia: -Potassium today 3.6.  No supplementation required.  4.  Weight loss: -She gained her weight back.  Continue boost daily.  5.  Diffuse body pains and back pain: -I gave her prescription for tramadol at last visit.  She apparently lost them. -I have sent another prescription today.   Orders placed this encounter:  No orders of the defined types were placed in this encounter.    Derek Jack, MD [place] [phone]   I, Milinda Antis, am acting as a scribe for Dr. Sanda Linger.  I, Derek Jack MD, have reviewed the above documentation for accuracy and completeness, and I agree with the above.

## 2019-10-22 ENCOUNTER — Telehealth: Payer: Self-pay | Admitting: Nurse Practitioner

## 2019-10-22 LAB — AFP TUMOR MARKER: AFP, Serum, Tumor Marker: 3 ng/mL (ref 0.0–8.3)

## 2019-10-22 NOTE — Telephone Encounter (Signed)
Please tell the patient I looked at her labs (unable to send result note due to ordered by Dr. Raliegh Ip).  CBC looks good. Clotting function normal. Liver numbers look great! Tumor marker normal. Overall, labs look good!  Keep follow-up appointment for chronic liver care.  MELD: 6 Child-Pugh: A

## 2019-10-22 NOTE — Telephone Encounter (Signed)
Noted. Will send lab result note shortly.

## 2019-10-22 NOTE — Telephone Encounter (Signed)
Spoke with pt. Pt was notified of results and will follow up as directed.

## 2019-11-11 ENCOUNTER — Inpatient Hospital Stay (HOSPITAL_COMMUNITY): Payer: Medicaid Other

## 2019-11-11 ENCOUNTER — Inpatient Hospital Stay (HOSPITAL_COMMUNITY): Payer: Medicaid Other | Attending: Hematology | Admitting: Hematology

## 2019-11-11 ENCOUNTER — Other Ambulatory Visit: Payer: Self-pay

## 2019-11-11 VITALS — BP 106/66 | HR 76 | Temp 98.1°F | Resp 18

## 2019-11-11 VITALS — BP 110/67 | HR 89 | Temp 97.0°F | Resp 18 | Wt 158.6 lb

## 2019-11-11 DIAGNOSIS — R634 Abnormal weight loss: Secondary | ICD-10-CM | POA: Insufficient documentation

## 2019-11-11 DIAGNOSIS — C3491 Malignant neoplasm of unspecified part of right bronchus or lung: Secondary | ICD-10-CM

## 2019-11-11 DIAGNOSIS — Z5111 Encounter for antineoplastic chemotherapy: Secondary | ICD-10-CM | POA: Diagnosis present

## 2019-11-11 DIAGNOSIS — Z23 Encounter for immunization: Secondary | ICD-10-CM | POA: Diagnosis not present

## 2019-11-11 DIAGNOSIS — E876 Hypokalemia: Secondary | ICD-10-CM | POA: Diagnosis not present

## 2019-11-11 DIAGNOSIS — C3431 Malignant neoplasm of lower lobe, right bronchus or lung: Secondary | ICD-10-CM | POA: Diagnosis present

## 2019-11-11 LAB — CBC WITH DIFFERENTIAL/PLATELET
Abs Immature Granulocytes: 0.01 10*3/uL (ref 0.00–0.07)
Basophils Absolute: 0 10*3/uL (ref 0.0–0.1)
Basophils Relative: 0 %
Eosinophils Absolute: 0 10*3/uL (ref 0.0–0.5)
Eosinophils Relative: 0 %
HCT: 37.5 % (ref 36.0–46.0)
Hemoglobin: 12.1 g/dL (ref 12.0–15.0)
Immature Granulocytes: 0 %
Lymphocytes Relative: 30 %
Lymphs Abs: 1.5 10*3/uL (ref 0.7–4.0)
MCH: 28.9 pg (ref 26.0–34.0)
MCHC: 32.3 g/dL (ref 30.0–36.0)
MCV: 89.5 fL (ref 80.0–100.0)
Monocytes Absolute: 0.3 10*3/uL (ref 0.1–1.0)
Monocytes Relative: 5 %
Neutro Abs: 3.2 10*3/uL (ref 1.7–7.7)
Neutrophils Relative %: 65 %
Platelets: 165 10*3/uL (ref 150–400)
RBC: 4.19 MIL/uL (ref 3.87–5.11)
RDW: 20.5 % — ABNORMAL HIGH (ref 11.5–15.5)
WBC: 5.1 10*3/uL (ref 4.0–10.5)
nRBC: 0 % (ref 0.0–0.2)

## 2019-11-11 LAB — COMPREHENSIVE METABOLIC PANEL
ALT: 21 U/L (ref 0–44)
AST: 30 U/L (ref 15–41)
Albumin: 3.9 g/dL (ref 3.5–5.0)
Alkaline Phosphatase: 55 U/L (ref 38–126)
Anion gap: 13 (ref 5–15)
BUN: 7 mg/dL (ref 6–20)
CO2: 25 mmol/L (ref 22–32)
Calcium: 9.1 mg/dL (ref 8.9–10.3)
Chloride: 98 mmol/L (ref 98–111)
Creatinine, Ser: 0.45 mg/dL (ref 0.44–1.00)
GFR, Estimated: >60 mL/min (ref >60)
Glucose, Bld: 150 mg/dL — ABNORMAL HIGH (ref 70–99)
Potassium: 3.7 mmol/L (ref 3.5–5.1)
Sodium: 136 mmol/L (ref 135–145)
Total Bilirubin: 0.6 mg/dL (ref 0.3–1.2)
Total Protein: 7.1 g/dL (ref 6.5–8.1)

## 2019-11-11 MED ORDER — PALONOSETRON HCL INJECTION 0.25 MG/5ML
INTRAVENOUS | Status: AC
  Filled 2019-11-11: qty 5

## 2019-11-11 MED ORDER — INFLUENZA VAC SPLIT QUAD 0.5 ML IM SUSY
0.5000 mL | PREFILLED_SYRINGE | Freq: Once | INTRAMUSCULAR | Status: AC
  Administered 2019-11-11: 0.5 mL via INTRAMUSCULAR
  Filled 2019-11-11: qty 0.5

## 2019-11-11 MED ORDER — SODIUM CHLORIDE 0.9 % IV SOLN: Freq: Once | INTRAVENOUS | Status: AC

## 2019-11-11 MED ORDER — HEPARIN SOD (PORK) LOCK FLUSH 100 UNIT/ML IV SOLN
500.0000 [IU] | Freq: Once | INTRAVENOUS | Status: AC | PRN
  Administered 2019-11-11: 500 [IU]

## 2019-11-11 MED ORDER — SODIUM CHLORIDE 0.9 % IV SOLN
10.0000 mg | Freq: Once | INTRAVENOUS | Status: AC
  Administered 2019-11-11: 10 mg via INTRAVENOUS
  Filled 2019-11-11: qty 10

## 2019-11-11 MED ORDER — SODIUM CHLORIDE 0.9 % IV SOLN
601.5000 mg | Freq: Once | INTRAVENOUS | Status: AC
  Administered 2019-11-11: 600 mg via INTRAVENOUS
  Filled 2019-11-11: qty 60

## 2019-11-11 MED ORDER — SODIUM CHLORIDE 0.9% FLUSH
10.0000 mL | INTRAVENOUS | Status: DC | PRN
  Administered 2019-11-11: 10 mL

## 2019-11-11 MED ORDER — PALONOSETRON HCL INJECTION 0.25 MG/5ML
0.2500 mg | Freq: Once | INTRAVENOUS | Status: AC
  Administered 2019-11-11: 0.25 mg via INTRAVENOUS

## 2019-11-11 MED ORDER — SODIUM CHLORIDE 0.9 % IV SOLN
150.0000 mg | Freq: Once | INTRAVENOUS | Status: AC
  Administered 2019-11-11: 150 mg via INTRAVENOUS
  Filled 2019-11-11: qty 150

## 2019-11-11 MED ORDER — SODIUM CHLORIDE 0.9 % IV SOLN
500.0000 mg/m2 | Freq: Once | INTRAVENOUS | Status: AC
  Administered 2019-11-11: 900 mg via INTRAVENOUS
  Filled 2019-11-11: qty 16

## 2019-11-11 NOTE — Patient Instructions (Signed)
[facility] at [facility] Discharge Instructions  Labs drawn from portacath today   Thank you for choosing [facility] at [facility] to provide your oncology and hematology care.  To afford each patient quality time with our provider, please arrive at least 15 minutes before your scheduled appointment time.   If you have a lab appointment with the Cancer Center please come in thru the Main Entrance and check in at the main information desk.  You need to re-schedule your appointment should you arrive 10 or more minutes late.  We strive to give you quality time with our providers, and arriving late affects you and other patients whose appointments are after yours.  Also, if you no show three or more times for appointments you may be dismissed from the clinic at the providers discretion.     Again, thank you for choosing [facility].  Our hope is that these requests will decrease the amount of time that you wait before being seen by our physicians.       _____________________________________________________________  Should you have questions after your visit to [facility], please contact our office at [phone] and follow the prompts.  Our office hours are 8:00 a.m. and 4:30 p.m. Monday - Friday.  Please note that voicemails left after 4:00 p.m. may not be returned until the following business day.  We are closed weekends and major holidays.  You do have access to a nurse 24-7, just call the main number to the clinic [phone] and do not press any options, hold on the line and a nurse will answer the phone.    For prescription refill requests, have your pharmacy contact our office and allow 72 hours.    Due to Covid, you will need to wear a mask upon entering the hospital. If you do not have a mask, a mask will be given to you at the Main Entrance upon arrival. For doctor visits, patients may have 1 support person age 18  or older with them. For treatment visits, patients can not have anyone with them due to social distancing guidelines and our immunocompromised population.     

## 2019-11-11 NOTE — Progress Notes (Signed)
Patient was assessed by Dr. Katragadda and labs have been reviewed.  Patient is okay to proceed with treatment today. Primary RN and pharmacy aware.   

## 2019-11-11 NOTE — Patient Instructions (Signed)
Rachel Gross at [facility] Discharge Instructions  You were seen today by Dr. Delton Coombes. He went over your recent results. You received your last treatment today. You will be scheduled for a CT scan of your chest before your next visit. Dr. Delton Coombes will see you back in 4 weeks for labs and follow up.   Thank you for choosing [facility] at [facility] to provide your oncology and hematology care.  To afford each patient quality time with our provider, please arrive at least 15 minutes before your scheduled appointment time.   If you have a lab appointment with the [facility] please come in thru the Main Entrance and check in at the main information desk  You need to re-schedule your appointment should you arrive 10 or more minutes late.  We strive to give you quality time with our providers, and arriving late affects you and other patients whose appointments are after yours.  Also, if you no show three or more times for appointments you may be dismissed from the clinic at the providers discretion.     Again, thank you for choosing [facility].  Our hope is that these requests will decrease the amount of time that you wait before being seen by our physicians.       _____________________________________________________________  Should you have questions after your visit to [facility], please contact our office at [phone] between the hours of 8:00 a.m. and 4:30 p.m.  Voicemails left after 4:00 p.m. will not be returned until the following business day.  For prescription refill requests, have your pharmacy contact our office and allow 72 hours.    Cancer Center Support Programs:   > Cancer Support Group  2nd Tuesday of the month 1pm-2pm, Journey Room

## 2019-11-11 NOTE — Progress Notes (Signed)
[id number] Labs reviewed with and pt seen by Dr. Delton Coombes and pt approved for Alimta and Carboplatin infusions today per MD         Petra Kuba tolerated Alimta and Carboplatin infusions as well as Influenza vaccine well without complaints or incident. VSS upon discharge. Pt discharged self ambulatory in satisfactory condition

## 2019-11-11 NOTE — Patient Instructions (Signed)
[facility] Discharge Instructions for Patients Receiving Chemotherapy   Beginning January 23rd 2017 lab work for the [facility] will be done in the  Main lab at [facility] on 1st floor. If you have a lab appointment with the [facility] please come in thru the  Main Entrance and check in at the main information desk   Today you received the following chemotherapy agents Carboplatin and Alimta. Follow-up as scheduled  To help prevent nausea and vomiting after your treatment, we encourage you to take your nausea medication   If you develop nausea and vomiting, or diarrhea that is not controlled by your medication, call the clinic.  The clinic phone number is [phone]. Office hours are Monday-Friday 8:30am-5:00pm.  BELOW ARE SYMPTOMS THAT SHOULD BE REPORTED IMMEDIATELY:  *FEVER GREATER THAN 101.0 F  *CHILLS WITH OR WITHOUT FEVER  NAUSEA AND VOMITING THAT IS NOT CONTROLLED WITH YOUR NAUSEA MEDICATION  *UNUSUAL SHORTNESS OF BREATH  *UNUSUAL BRUISING OR BLEEDING  TENDERNESS IN MOUTH AND THROAT WITH OR WITHOUT PRESENCE OF ULCERS  *URINARY PROBLEMS  *BOWEL PROBLEMS  UNUSUAL RASH Items with * indicate a potential emergency and should be followed up as soon as possible. If you have an emergency after office hours please contact your primary care physician or go to the nearest emergency department.  Please call the clinic during office hours if you have any questions or concerns.   You may also contact the Patient Navigator at [phone] should you have any questions or need assistance in obtaining follow up care.      Resources For Cancer Patients and their Caregivers ? American Cancer Society: Can assist with transportation, wigs, general needs, runs Look Good Feel Better.        639-162-2846 ? Cancer Care: Provides financial assistance, online support groups, medication/co-pay assistance.  1-800-813-HOPE (316)042-5945) ? [place] Assists [place] cancer patients and their families through emotional , educational and financial support.  901 705 8689 ? [place] DSS Where to apply for food stamps, Medicaid and utility assistance. 438-523-5986 ? [facility]: Transportation to medical appointments. 613-517-0186 ? Social Security Administration: May apply for disability if have a Stage IV cancer. (218)312-3904 639 449 8703 ? [employer], Disability and Transit Services: Assists with nutrition, care and transit needs. 747 527 9059

## 2019-11-11 NOTE — Progress Notes (Signed)
Rachel Gross, [place] [address]   CLINIC:  Medical Oncology/Hematology  PCP:  Celene Squibb, MD [address] [name] [address] [phone]   REASON FOR VISIT:  Follow-up for stage III right lung cancer  PRIOR THERAPY: Right lower lobectomy on 06/19/2019  NGS Results: PD-L1 30%, MS--stable, 9 Muts/Mb  CURRENT THERAPY: Carboplatin and pemetrexed every 3 weeks  BRIEF ONCOLOGIC HISTORY:  Oncology History  Adenocarcinoma of lung, stage 3, right ([place])  07/02/2019 Initial Diagnosis   Adenocarcinoma of lung, stage 3, right ([place])   07/18/2019 Genetic Testing   Foundation One     07/23/2019 Genetic Testing   PD-L1     09/09/2019 -  Chemotherapy   The patient had palonosetron (ALOXI) injection 0.25 mg, 0.25 mg, Intravenous,  Once, 3 of 4 cycles Administration: 0.25 mg (09/09/2019), 0.25 mg (10/21/2019), 0.25 mg (09/30/2019) PEMEtrexed (ALIMTA) 700 mg in sodium chloride 0.9 % 100 mL chemo infusion, 400 mg/m2 = 700 mg (80 % of original dose 500 mg/m2), Intravenous,  Once, 3 of 4 cycles Dose modification: 400 mg/m2 (80 % of original dose 500 mg/m2, Cycle 1, Reason: Provider Judgment) Administration: 700 mg (09/09/2019), 900 mg (10/21/2019), 900 mg (09/30/2019) CARBOplatin (PARAPLATIN) 480 mg in sodium chloride 0.9 % 250 mL chemo infusion, 480 mg (80 % of original dose 601.5 mg), Intravenous,  Once, 3 of 4 cycles Dose modification:   (original dose 601.5 mg, Cycle 1), 481.2 mg (80 % of original dose 601.5 mg, Cycle 1, Reason: Provider Judgment),   (original dose 601.5 mg, Cycle 1),   (original dose 601.5 mg, Cycle 3),   (original dose 601.5 mg, Cycle 4),   (original dose 601.5 mg, Cycle 2) Administration: 480 mg (09/09/2019), 600 mg (10/21/2019), 500 mg (09/30/2019) fosaprepitant (EMEND) 150 mg in sodium chloride 0.9 % 145 mL IVPB, 150 mg, Intravenous,  Once, 3 of 4 cycles Administration: 150 mg (09/09/2019), 150 mg (10/21/2019), 150 mg (09/30/2019)  for  chemotherapy treatment.      CANCER STAGING: Cancer Staging No matching staging information was found for the patient.  INTERVAL HISTORY:  Rachel Gross, a 54 y.o. female, returns for routine follow-up and consideration for final cycle of chemotherapy. Rachel Gross was last seen on 10/21/2019.  Due for cycle #4 of carboplatin and pemetrexed today.   Overall, she tells me she has been feeling good. She reports that she felt more tired after the previous treatment, but denies having N/V. She denies easy bruising or bleeding or leg swelling.  Overall, she feels ready for final cycle of chemo today.    REVIEW OF SYSTEMS:  Review of Systems  Constitutional: Positive for appetite change (50%) and fatigue (75%).  HENT:   Positive for trouble swallowing.   Cardiovascular: Negative for leg swelling.  Gastrointestinal: Positive for constipation. Negative for nausea and vomiting.  Hematological: Does not bruise/bleed easily.  Psychiatric/Behavioral: Positive for depression and sleep disturbance. The patient is nervous/anxious.   All other systems reviewed and are negative.   PAST MEDICAL/SURGICAL HISTORY:  Past Medical History:  Diagnosis Date  . Alcoholic hepatitis without ascites   . Anxiety   . Arthritis    knees, hands  . 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 Bipolar disorder ([place])   . Cancer ([place])    right lung  . COPD (chronic obstructive pulmonary disease) ([place])   . Depression   . Dyspnea   . Emphysema lung ([place])   .  GERD (gastroesophageal reflux disease)   . Hepatitis C    s/p treatment with Epclusa  . History of HPV infection   . History of pneumonia   . Pneumonia   . Smoker    Past Surgical History:  Procedure Laterality Date  . BIOPSY  05/02/2018   Procedure: BIOPSY;  Surgeon: Daneil Dolin, MD;  [facility];  Service: Endoscopy;;  gastric  . CESAREAN SECTION    . CHEST TUBE INSERTION Right 06/26/2019   Procedure: Right  CHEST TUBE REPLACEMENT;  Surgeon: [name], MD;  Location: [facility];  Service: Thoracic;  Laterality: Right;  . COLONOSCOPY WITH PROPOFOL N/A 03/03/2019   Procedure: COLONOSCOPY WITH PROPOFOL;  Surgeon: Daneil Dolin, MD;  [facility];  Service: Endoscopy;  Laterality: N/A;  9:15am  . ESOPHAGOGASTRODUODENOSCOPY (EGD) WITH PROPOFOL N/A 05/02/2018   Procedure: ESOPHAGOGASTRODUODENOSCOPY (EGD) WITH PROPOFOL;  Surgeon: Daneil Dolin, MD;  [facility];  Service: Endoscopy;  Laterality: N/A;  8:30am  . EYE SURGERY Bilateral    cataract  . HEMOSTASIS CLIP PLACEMENT  03/03/2019   Procedure: HEMOSTASIS CLIP PLACEMENT;  Surgeon: Daneil Dolin, MD;  [facility];  Service: Endoscopy;;  . INTERCOSTAL NERVE BLOCK Right 06/19/2019   Procedure: Intercostal Nerve Block;  Surgeon: [name], MD;  Location: [facility];  Service: Thoracic;  Laterality: Right;  . IR US GUIDE VASC ACCESS RIGHT  03/13/2018  . LOBECTOMY    . LYMPH NODE DISSECTION Right 06/19/2019   Procedure: Lymph Node Dissection;  Surgeon: [name], MD;  Location: [facility];  Service: Thoracic;  Laterality: Right;  . POLYPECTOMY  03/03/2019   Procedure: POLYPECTOMY;  Surgeon: Daneil Dolin, MD;  [facility];  Service: Endoscopy;;  . PORTACATH PLACEMENT Left 09/08/2019   Procedure: INSERTION PORT-A-CATH LEFT CHEST (attached catheter in left subclavian);  Surgeon: Aviva Signs, MD;  Location: [facility];  Service: General;  Laterality: Left;  . TONSILLECTOMY    . TOOTH EXTRACTION  01/31/2018   x 7  . VIDEO BRONCHOSCOPY WITH INSERTION OF INTERBRONCHIAL VALVE (IBV) N/A 06/26/2019   Procedure: VIDEO BRONCHOSCOPY WITH INSERTION OF TWO INTERBRONCHIAL VALVE (IBV);  Surgeon: [name], MD;  Location: [facility] OR;  Service: Thoracic;  Laterality: N/A;  . VIDEO BRONCHOSCOPY WITH INSERTION OF INTERBRONCHIAL VALVE (IBV) N/A 08/08/2019   Procedure: VIDEO BRONCHOSCOPY WITH REMOVAL OF  INTERBRONCHIAL VALVE (IBV);  Surgeon: [name], MD;  Location: [facility] OR;  Service: Thoracic;  Laterality: N/A;    SOCIAL HISTORY:  Social History   Socioeconomic History  . Marital status: Single    Spouse name: Not on file  . Number of children: Not on file  . Years of education: Not on file  . Highest education level: Not on file  Occupational History  . Not on file  Tobacco Use  . Smoking status: Former Smoker    Packs/day: 0.50    Years: 38.00    Pack years: 19.00    Types: Cigarettes    Quit date: 06/19/2019    Years since quitting: 0.3  . Smokeless tobacco: Never Used  Vaping Use  . Vaping Use: Never used  Substance and Sexual Activity  . Alcohol use: Not Currently    Comment: Last use of alcohol 03/2016  . 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 Sexual activity: Not on file  Other Topics Concern  . Not  on file  Social History Narrative  . Not on file   Social Determinants of Health   Financial Resource Strain:   . Difficulty of Paying Living Expenses: Not on file  Food Insecurity:   . Worried About Charity fundraiser in the Last Year: Not on file  . Ran Out of Food in the Last Year: Not on file  Transportation Needs:   . Lack of Transportation (Medical): Not on file  . Lack of Transportation (Non-Medical): Not on file  Physical Activity:   . Days of Exercise per Week: Not on file  . Minutes of Exercise per Session: Not on file  Stress:   . Feeling of Stress : Not on file  Social Connections:   . Frequency of Communication with Friends and Family: Not on file  . Frequency of Social Gatherings with Friends and Family: Not on file  . Attends Religious Services: Not on file  . Active Member of Clubs or Organizations: Not on file  . Attends Archivist Meetings: Not on file  . Marital Status: Not on file  Intimate Partner Violence:   . Fear of Current or Ex-Partner: Not on file  . Emotionally Abused: Not on file    . Physically Abused: Not on file  . Sexually Abused: Not on file    FAMILY HISTORY:  Family History  Problem Relation Age of Onset  . Congenital heart disease Mother   . Bipolar disorder Mother   . Prostate cancer Brother   . Alzheimer's disease Paternal Grandmother   . Colon cancer Neg Hx     CURRENT MEDICATIONS:  Current Outpatient Medications  Medication Sig Dispense Refill  . albuterol (VENTOLIN HFA) 108 (90 Base) MCG/ACT inhaler Inhale 2 puffs into the lungs every 4 (four) hours as needed for wheezing or shortness of breath.     . diphenhydrAMINE (BENADRYL) 25 MG tablet Take 25 mg by mouth at bedtime.     . divalproex (DEPAKOTE) 250 MG DR tablet Take 250 mg by mouth at bedtime.     . folic acid (FOLVITE) 1 MG tablet Take 1 mg by mouth daily.    . pantoprazole (PROTONIX) 40 MG tablet Take one tablet before breakfast daily. (Patient taking differently: Take 40 mg by mouth daily before breakfast. ) 90 tablet 3  . risperiDONE (RISPERDAL) 2 MG tablet Take 2 mg by mouth at bedtime.     Marland Kitchen scopolamine (TRANSDERM-SCOP) 1 MG/3DAYS Place 1 patch (1.5 mg total) onto the skin every 3 (three) days. 10 patch 12  . SYMBICORT 160-4.5 MCG/ACT inhaler Inhale 2 puffs into the lungs 2 (two) times daily.    . traMADol (ULTRAM) 50 MG tablet Take 1 tablet (50 mg total) by mouth daily as needed. 30 tablet 0  . lidocaine-prilocaine (EMLA) cream Apply a small amount to port a cath site and cover with plastic wrap 1 hour prior to chemotherapy appointments (Patient not taking: Reported on 11/11/2019) 30 g 3  . ondansetron (ZOFRAN ODT) 8 MG disintegrating tablet Place 1 tablet under tongue every 8 hours as needed for nausea. You may begin using this medication on the third day after each chemotherapy treatment. (Patient not taking: Reported on 11/11/2019) 20 tablet 1   Current Facility-Administered Medications  Medication Dose Route Frequency Provider Last Rate Last Admin  . influenza vac split quadrivalent  PF (FLUARIX) injection 0.5 mL  0.5 mL Intramuscular Once Derek Jack, MD        ALLERGIES:  Allergies  Allergen  Reactions  . Folic Acid Anaphylaxis    Not entirely clear if it is from folic acid. She is taking folic acid now and doesn't have the throat swelling anymore  . Penicillins Anaphylaxis    Throat swelled Did it involve swelling of the face/tongue/throat, SOB, or low BP? Yes Did it involve sudden or severe rash/hives, skin peeling, or any reaction on the inside of your mouth or nose? No Did you need to seek medical attention at a hospital or doctor's office? No When did it last happen?childhood allergy If all above answers are "NO", may proceed with cephalosporin use.   Marland Kitchen Amoxicillin     hallucinations Did it involve swelling of the face/tongue/throat, SOB, or low BP? No Did it involve sudden or severe rash/hives, skin peeling, or any reaction on the inside of your mouth or nose? No Did you need to seek medical attention at a hospital or doctor's office? Yes When did it last happen?July 2019 If all above answers are "NO", may proceed with cephalosporin use.   . Fish Allergy Nausea Only  . Other Swelling    Patient states that the sunrise brand folic acid made her feel like her throat was swelling.     PHYSICAL EXAM:  Performance status (ECOG): 1 - Symptomatic but completely ambulatory  Vitals:   11/11/19 0850  BP: 110/67  Pulse: 89  Resp: 18  Temp: (!) 97 F (36.1 C)  SpO2: 99%   Wt Readings from Last 3 Encounters:  11/11/19 158 lb 9.6 oz (71.9 kg)  10/21/19 160 lb 8 oz (72.8 kg)  09/30/19 158 lb (71.7 kg)   Physical Exam Vitals reviewed.  Constitutional:      Appearance: Normal appearance.  Cardiovascular:     Rate and Rhythm: Normal rate and regular rhythm.     Pulses: Normal pulses.     Heart sounds: Normal heart sounds.  Pulmonary:     Effort: Pulmonary effort is normal.     Breath sounds: Normal breath sounds.  Chest:      Comments: Port-a-Cath in L chest Musculoskeletal:     Right lower leg: No edema.     Left lower leg: No edema.  Neurological:     General: No focal deficit present.     Mental Status: She is alert and oriented to person, place, and time.  Psychiatric:        Mood and Affect: Mood normal.        Behavior: Behavior normal.     LABORATORY DATA:  I have reviewed the labs as listed.  CBC Latest Ref Rng & Units 11/11/2019 10/21/2019 09/30/2019  WBC 4.0 - 10.5 K/uL 5.1 5.1 8.8  Hemoglobin 12.0 - 15.0 g/dL 12.1 12.1 11.7(L)  Hematocrit 36 - 46 % 37.5 38.0 36.5  Platelets 150 - 400 K/uL 165 167 262   CMP Latest Ref Rng & Units 11/11/2019 10/21/2019 09/30/2019  Glucose 70 - 99 mg/dL 150(H) 113(H) 123(H)  BUN 6 - 20 mg/dL 7 5(L) 5(L)  Creatinine 0.44 - 1.00 mg/dL 0.45 0.51 0.44  Sodium 135 - 145 mmol/L 136 137 131(L)  Potassium 3.5 - 5.1 mmol/L 3.7 3.6 3.5  Chloride 98 - 111 mmol/L 98 102 97(L)  CO2 22 - 32 mmol/L _0 Calcium 8.9 - 10.3 mg/dL 9.1 9.1 9.2  Total Protein 6.5 - 8.1 g/dL 7.1 7.0 7.9  Total Bilirubin 0.3 - 1.2 mg/dL 0.6 0.4 0.4  Alkaline Phos 38 - 126 U/L 55 66 58  AST 15 - 41 U/L _0 ALT 0 - 44 U/L _1 DIAGNOSTIC IMAGING:  I have independently reviewed the scans and discussed with the patient. No results found.   ASSESSMENT:  1.Stage IIIa (PT1CPN2) right lung adenocarcinoma: -PET scan on 05/13/2019 showed right lower lobe nodule concerning for bronchogenic carcinoma. Cirrhotic liver. -Right lower lobectomy and lymph node excision on 06/19/2019. -Pathology showed 1.7 cm right lower lobe adenocarcinoma, unifocal, lymphovascular invasion present. Margins negative. 2/14 lymph nodes positive (level 7 and 10R). PT1CPN2. -PD-L1 30%, K-ras G12A, MSI stable. EGFR mutation not identified. -Cycle 1 of carboplatin and pemetrexed on 09/09/2019.   PLAN:  1.Stage III right lung adenocarcinoma: -She has tolerated cycle 3 very well. -CBC showed platelet  count 165 and white count 5.1.  LFTs are normal. -Continue folic acid daily. -She will proceed with cycle 4 today.  I will give full dose of carboplatin and pemetrexed. -RTC 4 weeks with repeat CT of the chest with contrast.  2. Immune mediated thrombocytopenia: -Platelet count is remaining normal.  She was treated with hep C in the past and also has cirrhosis.  3. Hypokalemia: -Potassium is 3.7.  No supplementation necessary.  4. Weight loss: -She gained her weight back.  Continue boost daily.  5. Diffuse body pains and back pain: -Continue tramadol as needed.   Orders placed this encounter:  No orders of the defined types were placed in this encounter.    Derek Jack, MD [place] [phone]   I, Milinda Antis, am acting as a scribe for Dr. Sanda Linger.  I, Derek Jack MD, have reviewed the above documentation for accuracy and completeness, and I agree with the above.

## 2019-12-03 ENCOUNTER — Inpatient Hospital Stay (HOSPITAL_COMMUNITY): Payer: Medicaid Other | Attending: Hematology

## 2019-12-03 ENCOUNTER — Ambulatory Visit (HOSPITAL_COMMUNITY)
Admission: RE | Admit: 2019-12-03 | Discharge: 2019-12-03 | Disposition: A | Discharge status: Home / Self Care | Payer: Medicaid Other | Source: Ambulatory Visit | Attending: Hematology | Admitting: Hematology

## 2019-12-03 ENCOUNTER — Other Ambulatory Visit: Payer: Self-pay

## 2019-12-03 ENCOUNTER — Encounter (HOSPITAL_COMMUNITY): Payer: Self-pay

## 2019-12-03 DIAGNOSIS — C3491 Malignant neoplasm of unspecified part of right bronchus or lung: Secondary | ICD-10-CM

## 2019-12-03 DIAGNOSIS — Z452 Encounter for adjustment and management of vascular access device: Secondary | ICD-10-CM | POA: Insufficient documentation

## 2019-12-03 DIAGNOSIS — D693 Immune thrombocytopenic purpura: Secondary | ICD-10-CM | POA: Diagnosis not present

## 2019-12-03 DIAGNOSIS — C3431 Malignant neoplasm of lower lobe, right bronchus or lung: Secondary | ICD-10-CM

## 2019-12-03 DIAGNOSIS — Z85118 Personal history of other malignant neoplasm of bronchus and lung: Secondary | ICD-10-CM | POA: Insufficient documentation

## 2019-12-03 LAB — CBC WITH DIFFERENTIAL/PLATELET
Abs Immature Granulocytes: 0.03 10*3/uL (ref 0.00–0.07)
Basophils Absolute: 0 10*3/uL (ref 0.0–0.1)
Basophils Relative: 0 %
Eosinophils Absolute: 0 10*3/uL (ref 0.0–0.5)
Eosinophils Relative: 0 %
HCT: 35.7 % — ABNORMAL LOW (ref 36.0–46.0)
Hemoglobin: 11.6 g/dL — ABNORMAL LOW (ref 12.0–15.0)
Immature Granulocytes: 0 %
Lymphocytes Relative: 31 %
Lymphs Abs: 2.1 10*3/uL (ref 0.7–4.0)
MCH: 30.4 pg (ref 26.0–34.0)
MCHC: 32.5 g/dL (ref 30.0–36.0)
MCV: 93.5 fL (ref 80.0–100.0)
Monocytes Absolute: 0.7 10*3/uL (ref 0.1–1.0)
Monocytes Relative: 10 %
Neutro Abs: 4 10*3/uL (ref 1.7–7.7)
Neutrophils Relative %: 59 %
Platelets: 150 10*3/uL (ref 150–400)
RBC: 3.82 MIL/uL — ABNORMAL LOW (ref 3.87–5.11)
RDW: 19.9 % — ABNORMAL HIGH (ref 11.5–15.5)
WBC: 6.8 10*3/uL (ref 4.0–10.5)
nRBC: 0 % (ref 0.0–0.2)

## 2019-12-03 LAB — COMPREHENSIVE METABOLIC PANEL
ALT: 18 U/L (ref 0–44)
AST: 26 U/L (ref 15–41)
Albumin: 4.2 g/dL (ref 3.5–5.0)
Alkaline Phosphatase: 61 U/L (ref 38–126)
Anion gap: 8 (ref 5–15)
BUN: 8 mg/dL (ref 6–20)
CO2: 24 mmol/L (ref 22–32)
Calcium: 8.9 mg/dL (ref 8.9–10.3)
Chloride: 101 mmol/L (ref 98–111)
Creatinine, Ser: 0.65 mg/dL (ref 0.44–1.00)
GFR, Estimated: >60 mL/min (ref >60)
Glucose, Bld: 105 mg/dL — ABNORMAL HIGH (ref 70–99)
Potassium: 3.5 mmol/L (ref 3.5–5.1)
Sodium: 133 mmol/L — ABNORMAL LOW (ref 135–145)
Total Bilirubin: 0.6 mg/dL (ref 0.3–1.2)
Total Protein: 7.2 g/dL (ref 6.5–8.1)

## 2019-12-03 MED ORDER — HEPARIN SOD (PORK) LOCK FLUSH 100 UNIT/ML IV SOLN
500.0000 [IU] | Freq: Once | INTRAVENOUS | Status: AC
  Administered 2019-12-03: 500 [IU] via INTRAVENOUS

## 2019-12-03 MED ORDER — IOHEXOL 300 MG/ML  SOLN
75.0000 mL | Freq: Once | INTRAMUSCULAR | Status: AC | PRN
  Administered 2019-12-03: 75 mL via INTRAVENOUS

## 2019-12-03 MED ORDER — SODIUM CHLORIDE 0.9% FLUSH
20.0000 mL | INTRAVENOUS | Status: DC | PRN
  Administered 2019-12-03: 20 mL via INTRAVENOUS

## 2019-12-03 NOTE — Progress Notes (Signed)
Rachel Gross tolerated port lab draw with flush well without complaints or incident.Port accessed with 20 gauge power port needle with blood drawn for labs ordered then pt sent to CT for scan and returned to have port flushed easily per protocol then de-accessed. VSS Pt discharged self ambulatory in satisfactory condition

## 2019-12-03 NOTE — Patient Instructions (Addendum)
[facility] at [facility] Discharge Instructions  Labs drawn from portacath today. Follow-up as scheduled   Thank you for choosing [facility] at [facility] to provide your oncology and hematology care.  To afford each patient quality time with our provider, please arrive at least 15 minutes before your scheduled appointment time.   If you have a lab appointment with the Cancer Center please come in thru the Main Entrance and check in at the main information desk.  You need to re-schedule your appointment should you arrive 10 or more minutes late.  We strive to give you quality time with our providers, and arriving late affects you and other patients whose appointments are after yours.  Also, if you no show three or more times for appointments you may be dismissed from the clinic at the providers discretion.     Again, thank you for choosing [facility].  Our hope is that these requests will decrease the amount of time that you wait before being seen by our physicians.       _____________________________________________________________  Should you have questions after your visit to [facility], please contact our office at [phone] and follow the prompts.  Our office hours are 8:00 a.m. and 4:30 p.m. Monday - Friday.  Please note that voicemails left after 4:00 p.m. may not be returned until the following business day.  We are closed weekends and major holidays.  You do have access to a nurse 24-7, just call the main number to the clinic [phone] and do not press any options, hold on the line and a nurse will answer the phone.    For prescription refill requests, have your pharmacy contact our office and allow 72 hours.    Due to Covid, you will need to wear a mask upon entering the hospital. If you do not have a mask, a mask will be given to you at the Main Entrance upon arrival. For doctor visits, patients may have  1 support person age 18 or older with them. For treatment visits, patients can not have anyone with them due to social distancing guidelines and our immunocompromised population.     

## 2019-12-10 ENCOUNTER — Inpatient Hospital Stay (HOSPITAL_BASED_OUTPATIENT_CLINIC_OR_DEPARTMENT_OTHER): Payer: Medicaid Other | Admitting: Hematology

## 2019-12-10 ENCOUNTER — Other Ambulatory Visit: Payer: Self-pay

## 2019-12-10 VITALS — BP 125/66 | HR 93 | Temp 98.0°F | Resp 16 | Wt 160.2 lb

## 2019-12-10 DIAGNOSIS — Z85118 Personal history of other malignant neoplasm of bronchus and lung: Secondary | ICD-10-CM | POA: Diagnosis not present

## 2019-12-10 DIAGNOSIS — C3491 Malignant neoplasm of unspecified part of right bronchus or lung: Secondary | ICD-10-CM | POA: Diagnosis not present

## 2019-12-10 NOTE — Progress Notes (Signed)
[facility] [address]  [address]   CLINIC:  Medical Oncology/Hematology  PCP:  Celene Squibb, MD [address] [name] [address] [phone]   REASON FOR VISIT:  Follow-up for stage III right lung cancer  PRIOR THERAPY:  1. Right lower lobectomy on 06/19/2019. 2. Carboplatin and pemetrexed x 4 cycles from 09/09/2019 to 11/11/2019.  NGS Results: PD-L1 30%, Foundation 1 MS--stable, TMB 9 Muts/Mb  CURRENT THERAPY: Observation  BRIEF ONCOLOGIC HISTORY:  Oncology History  Adenocarcinoma of lung, stage 3, right ([place])  07/02/2019 Initial Diagnosis   Adenocarcinoma of lung, stage 3, right ([place])   07/18/2019 Genetic Testing   Foundation One     07/23/2019 Genetic Testing   PD-L1     09/09/2019 -  Chemotherapy   The patient had palonosetron (ALOXI) injection 0.25 mg, 0.25 mg, Intravenous,  Once, 4 of 4 cycles Administration: 0.25 mg (09/09/2019), 0.25 mg (10/21/2019), 0.25 mg (11/11/2019), 0.25 mg (09/30/2019) PEMEtrexed (ALIMTA) 700 mg in sodium chloride 0.9 % 100 mL chemo infusion, 400 mg/m2 = 700 mg (80 % of original dose 500 mg/m2), Intravenous,  Once, 4 of 4 cycles Dose modification: 400 mg/m2 (80 % of original dose 500 mg/m2, Cycle 1, Reason: Provider Judgment) Administration: 700 mg (09/09/2019), 900 mg (10/21/2019), 900 mg (11/11/2019), 900 mg (09/30/2019) CARBOplatin (PARAPLATIN) 480 mg in sodium chloride 0.9 % 250 mL chemo infusion, 480 mg (80 % of original dose 601.5 mg), Intravenous,  Once, 4 of 4 cycles Dose modification:   (original dose 601.5 mg, Cycle 1), 481.2 mg (80 % of original dose 601.5 mg, Cycle 1, Reason: Provider Judgment),   (original dose 601.5 mg, Cycle 1),   (original dose 601.5 mg, Cycle 3),   (original dose 601.5 mg, Cycle 4),   (original dose 601.5 mg, Cycle 2) Administration: 480 mg (09/09/2019), 600 mg (10/21/2019), 600 mg (11/11/2019), 500 mg (09/30/2019) fosaprepitant (EMEND) 150 mg in sodium chloride 0.9 % 145  mL IVPB, 150 mg, Intravenous,  Once, 4 of 4 cycles Administration: 150 mg (09/09/2019), 150 mg (10/21/2019), 150 mg (11/11/2019), 150 mg (09/30/2019)  for chemotherapy treatment.      CANCER STAGING: Cancer Staging No matching staging information was found for the patient.  INTERVAL HISTORY:  [name], a 54 y.o. female, returns for routine follow-up of her stage III right lung cancer. Rachel Gross was last seen on 11/11/2019.   Today she reports feeling well. She spent today doing yard work raking in [employer]; she can do about 3 hours of labor before she becomes fatigued. She reports that her hair continues to come out. Her appetite is slowly improving.   REVIEW OF SYSTEMS:  Review of Systems  Constitutional: Positive for appetite change (50%) and fatigue (90%).  HENT:   Positive for trouble swallowing.   Respiratory: Positive for shortness of breath.   Musculoskeletal: Positive for arthralgias (10/10 feet pain).  Neurological: Positive for dizziness and headaches.  Psychiatric/Behavioral: Positive for depression. The patient is nervous/anxious.   All other systems reviewed and are negative.   PAST MEDICAL/SURGICAL HISTORY:  Past Medical History:  Diagnosis Date  . Alcoholic hepatitis without ascites   . Anxiety   . Arthritis    knees, hands  . 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 Bipolar disorder ([place])   . Cancer ([place])    right lung  . COPD (chronic obstructive pulmonary disease) ([place])   . Depression   . Dyspnea   .  Emphysema lung ([place])   . GERD (gastroesophageal reflux disease)   . Hepatitis C    s/p treatment with Epclusa  . History of HPV infection   . History of pneumonia   . Pneumonia   . Smoker    Past Surgical History:  Procedure Laterality Date  . BIOPSY  05/02/2018   Procedure: BIOPSY;  Surgeon: Daneil Dolin, MD;  [facility];  Service: Endoscopy;;  gastric  . CESAREAN SECTION    . CHEST TUBE INSERTION Right  06/26/2019   Procedure: Right CHEST TUBE REPLACEMENT;  Surgeon: [name], MD;  Location: [facility];  Service: Thoracic;  Laterality: Right;  . COLONOSCOPY WITH PROPOFOL N/A 03/03/2019   Procedure: COLONOSCOPY WITH PROPOFOL;  Surgeon: Daneil Dolin, MD;  [facility];  Service: Endoscopy;  Laterality: N/A;  9:15am  . ESOPHAGOGASTRODUODENOSCOPY (EGD) WITH PROPOFOL N/A 05/02/2018   Procedure: ESOPHAGOGASTRODUODENOSCOPY (EGD) WITH PROPOFOL;  Surgeon: Daneil Dolin, MD;  [facility];  Service: Endoscopy;  Laterality: N/A;  8:30am  . EYE SURGERY Bilateral    cataract  . HEMOSTASIS CLIP PLACEMENT  03/03/2019   Procedure: HEMOSTASIS CLIP PLACEMENT;  Surgeon: Daneil Dolin, MD;  [facility];  Service: Endoscopy;;  . INTERCOSTAL NERVE BLOCK Right 06/19/2019   Procedure: Intercostal Nerve Block;  Surgeon: [name], MD;  Location: [facility];  Service: Thoracic;  Laterality: Right;  . IR US GUIDE VASC ACCESS RIGHT  03/13/2018  . LOBECTOMY    . LYMPH NODE DISSECTION Right 06/19/2019   Procedure: Lymph Node Dissection;  Surgeon: [name], MD;  Location: [facility];  Service: Thoracic;  Laterality: Right;  . POLYPECTOMY  03/03/2019   Procedure: POLYPECTOMY;  Surgeon: Daneil Dolin, MD;  [facility];  Service: Endoscopy;;  . PORTACATH PLACEMENT Left 09/08/2019   Procedure: INSERTION PORT-A-CATH LEFT CHEST (attached catheter in left subclavian);  Surgeon: Aviva Signs, MD;  Location: [facility];  Service: General;  Laterality: Left;  . TONSILLECTOMY    . TOOTH EXTRACTION  01/31/2018   x 7  . VIDEO BRONCHOSCOPY WITH INSERTION OF INTERBRONCHIAL VALVE (IBV) N/A 06/26/2019   Procedure: VIDEO BRONCHOSCOPY WITH INSERTION OF TWO INTERBRONCHIAL VALVE (IBV);  Surgeon: [name], MD;  Location[facility];  Service: Thoracic;  Laterality: N/A;  . VIDEO BRONCHOSCOPY WITH INSERTION OF INTERBRONCHIAL VALVE (IBV) N/A 08/08/2019   Procedure: VIDEO  BRONCHOSCOPY WITH REMOVAL OF INTERBRONCHIAL VALVE (IBV);  Surgeon: [name], MD;  Location[facility];  Service: Thoracic;  Laterality: N/A;    SOCIAL HISTORY:  Social History   Socioeconomic History  . Marital status: Single    Spouse name: Not on file  . Number of children: Not on file  . Years of education: Not on file  . Highest education level: Not on file  Occupational History  . Not on file  Tobacco Use  . Smoking status: Former Smoker    Packs/day: 0.50    Years: 38.00    Pack years: 19.00    Types: Cigarettes    Quit date: 06/19/2019    Years since quitting: 0.4  . Smokeless tobacco: Never Used  Vaping Use  . Vaping Use: Never used  Substance and Sexual Activity  . Alcohol use: Not Currently    Comment: Last use of alcohol 03/2016  . 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 Sexual activity: Not on file  Other Topics Concern  . Not on file  Social History Narrative  . Not on file   Social Determinants of Health   Financial Resource Strain: Medium Risk  . Difficulty of Paying Living Expenses: Somewhat hard  Food Insecurity: Food Insecurity Present  . Worried About Charity fundraiser in the Last Year: Often true  . Ran Out of Food in the Last Year: Often true  Transportation Needs: No Transportation Needs  . Lack of Transportation (Medical): No  . Lack of Transportation (Non-Medical): No  Physical Activity: Sufficiently Active  . Days of Exercise per Week: 3 days  . Minutes of Exercise per Session: 150+ min  Stress: Stress Concern Present  . Feeling of Stress : Very much  Social Connections: Socially Isolated  . Frequency of Communication with Friends and Family: More than three times a week  . Frequency of Social Gatherings with Friends and Family: Once a week  . Attends Religious Services: Never  . Active Member of Clubs or Organizations: No  . Attends Archivist Meetings: Never  . Marital Status: Divorced    Human resources officer Violence: Not At Risk  . Fear of Current or Ex-Partner: No  . Emotionally Abused: No  . Physically Abused: No  . Sexually Abused: No    FAMILY HISTORY:  Family History  Problem Relation Age of Onset  . Congenital heart disease Mother   . Bipolar disorder Mother   . Prostate cancer Brother   . Alzheimer's disease Paternal Grandmother   . Colon cancer Neg Hx     CURRENT MEDICATIONS:  Current Outpatient Medications  Medication Sig Dispense Refill  . albuterol (VENTOLIN HFA) 108 (90 Base) MCG/ACT inhaler Inhale 2 puffs into the lungs every 4 (four) hours as needed for wheezing or shortness of breath.     . diphenhydrAMINE (BENADRYL) 25 MG tablet Take 25 mg by mouth at bedtime.     . divalproex (DEPAKOTE) 250 MG DR tablet Take 250 mg by mouth at bedtime.     . folic acid (FOLVITE) 1 MG tablet Take 1 mg by mouth daily.    . pantoprazole (PROTONIX) 40 MG tablet Take one tablet before breakfast daily. (Patient taking differently: Take 40 mg by mouth daily before breakfast. ) 90 tablet 3  . risperiDONE (RISPERDAL) 2 MG tablet Take 2 mg by mouth at bedtime.     Marland Kitchen scopolamine (TRANSDERM-SCOP) 1 MG/3DAYS Place 1 patch (1.5 mg total) onto the skin every 3 (three) days. 10 patch 12  . SYMBICORT 160-4.5 MCG/ACT inhaler Inhale 2 puffs into the lungs 2 (two) times daily.    . traMADol (ULTRAM) 50 MG tablet Take 1 tablet (50 mg total) by mouth daily as needed. 30 tablet 0  . lidocaine-prilocaine (EMLA) cream Apply a small amount to port a cath site and cover with plastic wrap 1 hour prior to chemotherapy appointments (Patient not taking: Reported on 12/10/2019) 30 g 3  . ondansetron (ZOFRAN ODT) 8 MG disintegrating tablet Place 1 tablet under tongue every 8 hours as needed for nausea. You may begin using this medication on the third day after each chemotherapy treatment. (Patient not taking: Reported on 12/10/2019) 20 tablet 1   No current facility-administered medications for this  visit.    ALLERGIES:  Allergies  Allergen Reactions  . Folic Acid Anaphylaxis    Not entirely clear if it is from folic acid. She is taking folic acid now and doesn't have the throat swelling anymore  . Penicillins Anaphylaxis  Throat swelled Did it involve swelling of the face/tongue/throat, SOB, or low BP? Yes Did it involve sudden or severe rash/hives, skin peeling, or any reaction on the inside of your mouth or nose? No Did you need to seek medical attention at a hospital or doctor's office? No When did it last happen?childhood allergy If all above answers are "NO", may proceed with cephalosporin use.   Marland Kitchen Amoxicillin     hallucinations Did it involve swelling of the face/tongue/throat, SOB, or low BP? No Did it involve sudden or severe rash/hives, skin peeling, or any reaction on the inside of your mouth or nose? No Did you need to seek medical attention at a hospital or doctor's office? Yes When did it last happen?July 2019 If all above answers are "NO", may proceed with cephalosporin use.   . Fish Allergy Nausea Only  . Other Swelling    Patient states that the sunrise brand folic acid made her feel like her throat was swelling.     PHYSICAL EXAM:  Performance status (ECOG): 1 - Symptomatic but completely ambulatory  Vitals:   12/10/19 1608  BP: 125/66  Pulse: 93  Resp: 16  Temp: 98 F (36.7 C)  SpO2: 99%   Wt Readings from Last 3 Encounters:  12/10/19 160 lb 3.2 oz (72.7 kg)  11/11/19 158 lb 9.6 oz (71.9 kg)  10/21/19 160 lb 8 oz (72.8 kg)   Physical Exam Vitals reviewed.  Constitutional:      Appearance: Normal appearance. She is obese.  Cardiovascular:     Rate and Rhythm: Normal rate and regular rhythm.     Pulses: Normal pulses.     Heart sounds: Normal heart sounds.  Pulmonary:     Effort: Pulmonary effort is normal.     Breath sounds: Normal breath sounds.  Musculoskeletal:     Right lower leg: No edema.     Left lower leg: No  edema.  Neurological:     General: No focal deficit present.     Mental Status: She is alert and oriented to person, place, and time.  Psychiatric:        Mood and Affect: Mood normal.        Behavior: Behavior normal.      LABORATORY DATA:  I have reviewed the labs as listed.  CBC Latest Ref Rng & Units 12/03/2019 11/11/2019 10/21/2019  WBC 4.0 - 10.5 K/uL 6.8 5.1 5.1  Hemoglobin 12.0 - 15.0 g/dL 11.6(L) 12.1 12.1  Hematocrit 36 - 46 % 35.7(L) 37.5 38.0  Platelets 150 - 400 K/uL 150 165 167   CMP Latest Ref Rng & Units 12/03/2019 11/11/2019 10/21/2019  Glucose 70 - 99 mg/dL 105(H) 150(H) 113(H)  BUN 6 - 20 mg/dL 8 7 5(L)  Creatinine 0.44 - 1.00 mg/dL 0.65 0.45 0.51  Sodium 135 - 145 mmol/L 133(L) 136 137  Potassium 3.5 - 5.1 mmol/L 3.5 3.7 3.6  Chloride 98 - 111 mmol/L 101 98 102  CO2 22 - 32 mmol/L _0 Calcium 8.9 - 10.3 mg/dL 8.9 9.1 9.1  Total Protein 6.5 - 8.1 g/dL 7.2 7.1 7.0  Total Bilirubin 0.3 - 1.2 mg/dL 0.6 0.6 0.4  Alkaline Phos 38 - 126 U/L 61 55 66  AST 15 - 41 U/L _1 ALT 0 - 44 U/L _2 DIAGNOSTIC IMAGING:  I have independently reviewed the scans and discussed with the patient. CT Chest W Contrast  Result Date: 12/03/2019 CLINICAL  DATA:  Non-small-cell lung cancer. Evaluate treatment response. Status post right lower lobectomy 06/19/2019. Status post recent chemotherapy. EXAM: CT CHEST WITH CONTRAST TECHNIQUE: Multidetector CT imaging of the chest was performed during intravenous contrast administration. CONTRAST:  4m OMNIPAQUE IOHEXOL 300 MG/ML  SOLN COMPARISON:  05/13/2019 PET.  Chest CT 05/01/2019. FINDINGS: Cardiovascular: Aortic and branch vessel atherosclerosis. Normal heart size, without pericardial effusion. Multivessel coronary artery atherosclerosis. No central pulmonary embolism, on this non-dedicated study. Mediastinum/Nodes: Upper normal left low jugular/supraclavicular node is similar at 6 mm, favored to be reactive. Right  paratracheal node measures 9 mm on 58/2 versus 6 mm on the prior. Subcarinal node is similar at 1.0 cm on 65/2. No hilar adenopathy. Lungs/Pleura: Minimal inferior right pleural fluid with mild loculation medially, including on 91/2. Minimal complexity within this fluid. Moderate paraseptal and mild centrilobular emphysema. Status post right lower lobectomy. Right minor fissure thickening and irregularity on 83/4 are new. A nodule along the right minor fissure of 8 mm on 85/4 may correspond to the 7 mm nodule on the prior and is most likely a subpleural lymph node. A left lower lobe pulmonary nodule measures 5 mm on 66/4 and is similar to on the prior exam (when remeasured). Upper Abdomen: Moderate cirrhosis. Portal venous hypertension, as evidenced by recanalized paraumbilical vein. Normal imaged portions of the spleen, stomach, pancreas, right adrenal gland, kidneys. Mild left adrenal thickening is unchanged. Musculoskeletal: Mild osteopenia. IMPRESSION: 1. Status post right lower lobectomy. 2. Small volume, partially loculated right-sided pleural fluid inferiorly and medially. Minimal increased density within the pleural fluid, presumably related to complexity. Given this finding, as well as new right minor fissure thickening/irregularity, recommend attention on follow-up to exclude right pleural metastasis. 3. Enlargement of a right paratracheal node which is not pathologic by size criteria. This could also be re-evaluated at follow-up. 4. Similar left lower lobe 5 mm pulmonary nodule, nonspecific. 5. Cirrhosis and portal venous hypertension. Electronically Signed   By: KAbigail MiyamotoM.D.   On: 12/03/2019 16:53     ASSESSMENT:  1.Stage IIIa (PT1CPN2) right lung adenocarcinoma: -PET scan on 05/13/2019 showed right lower lobe nodule concerning for bronchogenic carcinoma. Cirrhotic liver. -Right lower lobectomy and lymph node excision on 06/19/2019. -Pathology showed 1.7 cm right lower lobe adenocarcinoma,  unifocal, lymphovascular invasion present. Margins negative. 2/14 lymph nodes positive (level 7 and 10R). PT1CPN2. -PD-L1 30%, K-ras G12A, MSI stable. EGFR mutation not identified. -4 cycles of adjuvant carboplatin and pemetrexed from 09/09/2019 through 11/11/2019. -CT chest on 12/03/2019 shows small volume partially loculated right-sided pleural fluid inferiorly and medially.  Minimal increased density within the pleural fluid related to complexity.  Right paratracheal lymph node which is not pathologic by size criteria.  Similar left lower lobe 5 mm pulmonary nodule nonspecific.  Cirrhosis and portal venous hypertension.   PLAN:  1.Stage III right lung adenocarcinoma: -She is continuing to improve in regards to her energy levels. -Reviewed labs from 12/03/2019 which showed near normal CBC and normal LFTs.  Platelet count is normal. -Reviewed CT scan results with the patient. -Recommend follow-up in 4 months with repeat CT scan of the chest.  2. Immune mediated thrombocytopenia: -Platelet count is normal.  She was treated for hep C in the past and also has cirrhosis.  3.  Diffuse body pains and back pain: -Continue tramadol as needed.   Orders placed this encounter:  Orders Placed This Encounter  Procedures  . CT Chest W Contrast     SDerek Jack MD AForestine Na  [place] [phone]   Saunders Revel, am acting as a scribe for Dr. Sanda Linger.  I, Derek Jack MD, have reviewed the above documentation for accuracy and completeness, and I agree with the above.

## 2019-12-10 NOTE — Patient Instructions (Addendum)
[place] at [facility] Discharge Instructions  You were seen today by Dr. Delton Coombes. He went over your recent results. You will be scheduled for a CT chest before your next visit. Dr. Delton Coombes will see you back in 4 months for labs and follow up.   Thank you for choosing [place] at [facility] to provide your oncology and hematology care.  To afford each patient quality time with our provider, please arrive at least 15 minutes before your scheduled appointment time.   If you have a lab appointment with the [place] please come in thru the Main Entrance and check in at the main information desk  You need to re-schedule your appointment should you arrive 10 or more minutes late.  We strive to give you quality time with our providers, and arriving late affects you and other patients whose appointments are after yours.  Also, if you no show three or more times for appointments you may be dismissed from the clinic at the providers discretion.     Again, thank you for choosing [facility].  Our hope is that these requests will decrease the amount of time that you wait before being seen by our physicians.       _____________________________________________________________  Should you have questions after your visit to [facility], please contact our office at [phone] between the hours of 8:00 a.m. and 4:30 p.m.  Voicemails left after 4:00 p.m. will not be returned until the following business day.  For prescription refill requests, have your pharmacy contact our office and allow 72 hours.    Cancer Center Support Programs:   > Cancer Support Group  2nd Tuesday of the month 1pm-2pm, Journey Room

## 2019-12-17 ENCOUNTER — Ambulatory Visit: Payer: Medicaid Other | Admitting: Nurse Practitioner

## 2019-12-17 ENCOUNTER — Other Ambulatory Visit: Payer: Self-pay | Admitting: Internal Medicine

## 2019-12-17 ENCOUNTER — Telehealth: Payer: Self-pay | Admitting: Internal Medicine

## 2019-12-17 NOTE — Telephone Encounter (Signed)
Pt said her pharmacy was [facility] on [address] and they don't have it on file that her prescription was filled there. She doesn't know the name of the medicine, but it was for reflux and Dr Gala Romney wrote the prescription for her after she did the capsule study. She said she was out of her medicine and has been taking her father's medicine. Please advise. [phone]

## 2019-12-17 NOTE — Telephone Encounter (Signed)
Spoke with pt. Pt is aware that RX was sent into her pharmacy this morning by Roseanne Kaufman, NP.

## 2020-02-05 ENCOUNTER — Ambulatory Visit: Payer: Medicaid Other | Admitting: Nurse Practitioner

## 2020-02-13 ENCOUNTER — Other Ambulatory Visit: Payer: Medicaid Other

## 2020-02-13 DIAGNOSIS — Z20822 Contact with and (suspected) exposure to covid-19: Secondary | ICD-10-CM

## 2020-02-17 LAB — NOVEL CORONAVIRUS, NAA: SARS-CoV-2, NAA: DETECTED — AB

## 2020-02-18 ENCOUNTER — Telehealth: Payer: Self-pay

## 2020-02-18 NOTE — Telephone Encounter (Signed)
Called to discuss with patient about COVID-19 symptoms and the use of one of the available treatments for those with mild to moderate Covid symptoms and at a high risk of hospitalization.  Pt appears to qualify for outpatient treatment due to co-morbid conditions and/or a member of an at-risk group in accordance with the FDA Emergency Use Authorization.    Symptom onset: 02/10/20 Vaccinated: Yes Booster? No Immunocompromised? No Qualifiers: COPD,HTN  Unable to reach pt - Reached pt. Pt. Is outside of the treatment window. States she is feeling better. Starting on Prednisone for symptoms.   Marcello Moores

## 2020-03-09 ENCOUNTER — Encounter (HOSPITAL_COMMUNITY): Payer: Medicaid Other

## 2020-03-10 ENCOUNTER — Inpatient Hospital Stay (HOSPITAL_COMMUNITY): Payer: Medicaid Other | Attending: Hematology

## 2020-03-10 ENCOUNTER — Other Ambulatory Visit: Payer: Self-pay

## 2020-03-10 ENCOUNTER — Encounter (HOSPITAL_COMMUNITY): Payer: Self-pay

## 2020-03-10 DIAGNOSIS — Z85118 Personal history of other malignant neoplasm of bronchus and lung: Secondary | ICD-10-CM | POA: Insufficient documentation

## 2020-03-10 DIAGNOSIS — Z452 Encounter for adjustment and management of vascular access device: Secondary | ICD-10-CM | POA: Insufficient documentation

## 2020-03-10 MED ORDER — SODIUM CHLORIDE 0.9% FLUSH
10.0000 mL | INTRAVENOUS | Status: DC | PRN
  Administered 2020-03-10: 10 mL via INTRAVENOUS

## 2020-03-10 MED ORDER — HEPARIN SOD (PORK) LOCK FLUSH 100 UNIT/ML IV SOLN
500.0000 [IU] | Freq: Once | INTRAVENOUS | Status: AC
  Administered 2020-03-10: 500 [IU] via INTRAVENOUS

## 2020-03-10 NOTE — Progress Notes (Signed)
Rachel Gross tolerated port flush well without complaints or incident. Port accessed with 20 gauge needle and flushed easily per protocol then de-accessed. VSS Pt discharged self ambulatory in satisfactory condition

## 2020-03-10 NOTE — Patient Instructions (Signed)
[place] at [facility] Discharge Instructions  Port flushed per protocol today. Follow-up as scheduled   Thank you for choosing [place] at [facility] to provide your oncology and hematology care.  To afford each patient quality time with our provider, please arrive at least 15 minutes before your scheduled appointment time.   If you have a lab appointment with the [place] please come in thru the Main Entrance and check in at the main information desk.  You need to re-schedule your appointment should you arrive 10 or more minutes late.  We strive to give you quality time with our providers, and arriving late affects you and other patients whose appointments are after yours.  Also, if you no show three or more times for appointments you may be dismissed from the clinic at the providers discretion.     Again, thank you for choosing [facility].  Our hope is that these requests will decrease the amount of time that you wait before being seen by our physicians.       _____________________________________________________________  Should you have questions after your visit to [facility], please contact our office at [phone] and follow the prompts.  Our office hours are 8:00 a.m. and 4:30 p.m. Monday - Friday.  Please note that voicemails left after 4:00 p.m. may not be returned until the following business day.  We are closed weekends and major holidays.  You do have access to a nurse 24-7, just call the main number to the clinic [phone] and do not press any options, hold on the line and a nurse will answer the phone.    For prescription refill requests, have your pharmacy contact our office and allow 72 hours.    Due to Covid, you will need to wear a mask upon entering the hospital. If you do not have a mask, a mask will be given to you at the Main Entrance upon arrival. For doctor visits, patients may have  1 support person age 67 or older with them. For treatment visits, patients can not have anyone with them due to social distancing guidelines and our immunocompromised population.

## 2020-03-11 MED ORDER — CYANOCOBALAMIN 1000 MCG/ML IJ SOLN
INTRAMUSCULAR | Status: AC
  Filled 2020-03-11: qty 1

## 2020-03-17 ENCOUNTER — Ambulatory Visit: Payer: Medicaid Other | Admitting: Nurse Practitioner

## 2020-03-17 ENCOUNTER — Encounter: Payer: Self-pay | Admitting: Internal Medicine

## 2020-03-17 NOTE — Progress Notes (Deleted)
Referring Provider: Celene Squibb, MD Primary Care Physician:  Celene Squibb, MD Primary GI:  Dr. Gala Romney  No chief complaint on file.   HPI:   Rachel Gross is a 55 y.o. female who presents for follow-up on cirrhosis.  The patient was last seen in our office 06/11/2019 with the same as well as positive Cologuard and thrombocytopenia.  Chronic history of known cirrhosis.  Previous hepatitis C treated in [place], [place] in the setting of complicated cirrhosis and previous decompensation status post treatment with Epclusa.  EGD up-to-date 05/02/2018 with recommended repeat in 2 years (2022) for variceal screening purposes.  Last EGD with mild erosive reflux esophagitis, portal hypertensive gastropathy, erosive gastropathy status post biopsy that was negative for H. Pylori.  Due to positive Cologuard colonoscopy was completed 03/03/2019 with 4 total polyps, 1 of which was 15 mm and required clips x2 and these were all found to be tubular adenoma with recommended repeat colonoscopy in 3 years (2024).  At her last visit noted upcoming pleuroscopy and lower lobectomy for enlarging pulmonary nodule that was hypermetabolic on PET CT imaging.  She was still smoking but noted she was on her last pack and planning to quit after it was completed.  No overt GI or hepatic symptoms.  Liver imaging up-to-date.  Recommended updated labs for cirrhosis care, follow-up in 6 months.  Some of her labs were completed 06/17/2019 including CBC with mild leukocytosis but normal platelet count at 162, CMP essentially normal, INR normal at 1.0.  Unfortunately, AFP was not completed until September and found to be normal at 3.0. Calculated MELD score at 6, Child-Pugh: A.   It appears after lobectomy the patient was diagnosed with stage III lung cancer of the right lung and began chemotherapy on 09/09/2019.  She was last seen by oncology 12/10/2019.  Recommended follow-up in 4 months for repeat CT of the chest.  Today  she states she is doing okay overall.  Past Medical History:  Diagnosis Date  . Alcoholic hepatitis without ascites   . Anxiety   . Arthritis    knees, hands  . 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 Bipolar disorder ([place])   . Cancer ([place])    right lung  . COPD (chronic obstructive pulmonary disease) ([place])   . Depression   . Dyspnea   . Emphysema lung ([place])   . GERD (gastroesophageal reflux disease)   . Hepatitis C    s/p treatment with Epclusa  . History of HPV infection   . History of pneumonia   . Pneumonia   . Smoker     Past Surgical History:  Procedure Laterality Date  . BIOPSY  05/02/2018   Procedure: BIOPSY;  Surgeon: Daneil Dolin, MD;  [facility];  Service: Endoscopy;;  gastric  . CESAREAN SECTION    . CHEST TUBE INSERTION Right 06/26/2019   Procedure: Right CHEST TUBE REPLACEMENT;  Surgeon: [name], MD;  Location: [facility];  Service: Thoracic;  Laterality: Right;  . COLONOSCOPY WITH PROPOFOL N/A 03/03/2019   Procedure: COLONOSCOPY WITH PROPOFOL;  Surgeon: Daneil Dolin, MD;  [facility];  Service: Endoscopy;  Laterality: N/A;  9:15am  . ESOPHAGOGASTRODUODENOSCOPY (EGD) WITH PROPOFOL N/A 05/02/2018   Procedure: ESOPHAGOGASTRODUODENOSCOPY (EGD) WITH PROPOFOL;  Surgeon: Daneil Dolin, MD;  [facility];  Service: Endoscopy;  Laterality: N/A;  8:30am  . EYE SURGERY Bilateral    cataract  .  HEMOSTASIS CLIP PLACEMENT  03/03/2019   Procedure: HEMOSTASIS CLIP PLACEMENT;  Surgeon: Daneil Dolin, MD;  [facility];  Service: Endoscopy;;  . INTERCOSTAL NERVE BLOCK Right 06/19/2019   Procedure: Intercostal Nerve Block;  Surgeon: [name], MD;  Location: [facility];  Service: Thoracic;  Laterality: Right;  . IR US GUIDE VASC ACCESS RIGHT  03/13/2018  . LOBECTOMY    . LYMPH NODE DISSECTION Right 06/19/2019   Procedure: Lymph Node Dissection;  Surgeon: [name], MD;   Location: [facility];  Service: Thoracic;  Laterality: Right;  . POLYPECTOMY  03/03/2019   Procedure: POLYPECTOMY;  Surgeon: Daneil Dolin, MD;  [facility];  Service: Endoscopy;;  . PORTACATH PLACEMENT Left 09/08/2019   Procedure: INSERTION PORT-A-CATH LEFT CHEST (attached catheter in left subclavian);  Surgeon: Aviva Signs, MD;  Location: [facility];  Service: General;  Laterality: Left;  . TONSILLECTOMY    . TOOTH EXTRACTION  01/31/2018   x 7  . VIDEO BRONCHOSCOPY WITH INSERTION OF INTERBRONCHIAL VALVE (IBV) N/A 06/26/2019   Procedure: VIDEO BRONCHOSCOPY WITH INSERTION OF TWO INTERBRONCHIAL VALVE (IBV);  Surgeon: [name], MD;  Location: [facility] OR;  Service: Thoracic;  Laterality: N/A;  . VIDEO BRONCHOSCOPY WITH INSERTION OF INTERBRONCHIAL VALVE (IBV) N/A 08/08/2019   Procedure: VIDEO BRONCHOSCOPY WITH REMOVAL OF INTERBRONCHIAL VALVE (IBV);  Surgeon: [name], MD;  Location: [facility];  Service: Thoracic;  Laterality: N/A;    Current Outpatient Medications  Medication Sig Dispense Refill  . albuterol (VENTOLIN HFA) 108 (90 Base) MCG/ACT inhaler Inhale 2 puffs into the lungs every 4 (four) hours as needed for wheezing or shortness of breath.     . dexamethasone (DECADRON) 6 MG tablet TAKE 1 TABLET BY MOUTH EVERY DAY FOR 10 DAYS    . diphenhydrAMINE (BENADRYL) 25 MG tablet Take 25 mg by mouth at bedtime.     . divalproex (DEPAKOTE) 250 MG DR tablet Take 250 mg by mouth at bedtime.     . folic acid (FOLVITE) 1 MG tablet Take 1 mg by mouth daily.    Marland Kitchen lidocaine-prilocaine (EMLA) cream Apply a small amount to port a cath site and cover with plastic wrap 1 hour prior to chemotherapy appointments (Patient not taking: Reported on 12/10/2019) 30 g 3  . ondansetron (ZOFRAN ODT) 8 MG disintegrating tablet Place 1 tablet under tongue every 8 hours as needed for nausea. You may begin using this medication on the third day after each chemotherapy treatment. (Patient not taking: Reported  on 12/10/2019) 20 tablet 1  . pantoprazole (PROTONIX) 40 MG tablet TAKE 1 TABLET BY MOUTH DAILY BEFORE BREAKFAST 90 tablet 3  . risperiDONE (RISPERDAL) 2 MG tablet Take 2 mg by mouth at bedtime.     Marland Kitchen scopolamine (TRANSDERM-SCOP) 1 MG/3DAYS Place 1 patch (1.5 mg total) onto the skin every 3 (three) days. 10 patch 12  . SYMBICORT 160-4.5 MCG/ACT inhaler Inhale 2 puffs into the lungs 2 (two) times daily.    . traMADol (ULTRAM) 50 MG tablet Take 1 tablet (50 mg total) by mouth daily as needed. 30 tablet 0   No current facility-administered medications for this visit.    Allergies as of 03/17/2020 - Review Complete 03/10/2020  Allergen Reaction Noted  . Folic acid Anaphylaxis 08/14/2019  . Penicillins Anaphylaxis 09/10/2017  . Amoxicillin  09/10/2017  . Fish allergy Nausea Only 05/24/2017  . Other Swelling 08/12/2019    Family History  Problem Relation Age of Onset  .  Congenital heart disease Mother   . Bipolar disorder Mother   . Prostate cancer Brother   . Alzheimer's disease Paternal Grandmother   . Colon cancer Neg Hx     Social History   Socioeconomic History  . Marital status: Single    Spouse name: Not on file  . Number of children: Not on file  . Years of education: Not on file  . Highest education level: Not on file  Occupational History  . Not on file  Tobacco Use  . Smoking status: Former Smoker    Packs/day: 0.50    Years: 38.00    Pack years: 19.00    Types: Cigarettes    Quit date: 06/19/2019    Years since quitting: 0.7  . Smokeless tobacco: Never Used  Vaping Use  . Vaping Use: Never used  Substance and Sexual Activity  . Alcohol use: Not Currently    Comment: Last use of alcohol 03/2016  . 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 Sexual activity: Not on file  Other Topics Concern  . Not on file  Social History Narrative  . Not on file   Social Determinants of Health   Financial Resource Strain: Medium Risk  . Difficulty  of Paying Living Expenses: Somewhat hard  Food Insecurity: Food Insecurity Present  . Worried About Charity fundraiser in the Last Year: Often true  . Ran Out of Food in the Last Year: Often true  Transportation Needs: No Transportation Needs  . Lack of Transportation (Medical): No  . Lack of Transportation (Non-Medical): No  Physical Activity: Sufficiently Active  . Days of Exercise per Week: 3 days  . Minutes of Exercise per Session: 150+ min  Stress: Stress Concern Present  . Feeling of Stress : Very much  Social Connections: Socially Isolated  . Frequency of Communication with Friends and Family: More than three times a week  . Frequency of Social Gatherings with Friends and Family: Once a week  . Attends Religious Services: Never  . Active Member of Clubs or Organizations: No  . Attends Archivist Meetings: Never  . Marital Status: Divorced    Subjective:*** Review of Systems  Constitutional: Negative for chills, fever, malaise/fatigue and weight loss.  HENT: Negative for congestion and sore throat.   Respiratory: Negative for cough and shortness of breath.   Cardiovascular: Negative for chest pain and palpitations.  Gastrointestinal: Negative for abdominal pain, blood in stool, diarrhea, melena, nausea and vomiting.  Musculoskeletal: Negative for joint pain and myalgias.  Skin: Negative for rash.  Neurological: Negative for dizziness and weakness.  Endo/Heme/Allergies: Does not bruise/bleed easily.  Psychiatric/Behavioral: Negative for depression. The patient is not nervous/anxious.   All other systems reviewed and are negative.    Objective: There were no vitals taken for this visit. Physical Exam Vitals and nursing note reviewed.  Constitutional:      General: She is not in acute distress.    Appearance: Normal appearance. She is well-developed. She is not ill-appearing, toxic-appearing or diaphoretic.  HENT:     Head: Normocephalic and atraumatic.      Nose: No congestion or rhinorrhea.  Eyes:     General: No scleral icterus. Cardiovascular:     Rate and Rhythm: Normal rate and regular rhythm.     Heart sounds: Normal heart sounds.  Pulmonary:     Effort: Pulmonary effort is normal. No respiratory distress.     Breath  sounds: Normal breath sounds.  Abdominal:     General: Bowel sounds are normal.     Palpations: Abdomen is soft. There is no hepatomegaly, splenomegaly or mass.     Tenderness: There is no abdominal tenderness. There is no guarding or rebound.     Hernia: No hernia is present.  Skin:    General: Skin is warm and dry.     Coloration: Skin is not jaundiced.     Findings: No rash.  Neurological:     General: No focal deficit present.     Mental Status: She is alert and oriented to person, place, and time.  Psychiatric:        Attention and Perception: Attention normal.        Mood and Affect: Mood normal.        Speech: Speech normal.        Behavior: Behavior normal.        Thought Content: Thought content normal.        Cognition and Memory: Cognition and memory normal.      Assessment:  ***   Plan: ***    Thank you for allowing [place] to participate in the care of [place], DNP, AGNP-C Adult & Gerontological Nurse Practitioner [facility]   03/17/2020 7:38 AM   Disclaimer: This note was dictated with voice recognition software. Similar sounding words can inadvertently be transcribed and may not be corrected upon review.

## 2020-04-14 ENCOUNTER — Inpatient Hospital Stay (HOSPITAL_COMMUNITY): Payer: Medicaid Other | Attending: Hematology

## 2020-04-14 ENCOUNTER — Encounter (HOSPITAL_COMMUNITY): Payer: Self-pay | Admitting: Radiology

## 2020-04-14 ENCOUNTER — Other Ambulatory Visit: Payer: Self-pay

## 2020-04-14 ENCOUNTER — Ambulatory Visit (HOSPITAL_COMMUNITY)
Admission: RE | Admit: 2020-04-14 | Discharge: 2020-04-14 | Disposition: A | Discharge status: Home / Self Care | Payer: Medicaid Other | Source: Ambulatory Visit | Attending: Hematology | Admitting: Hematology

## 2020-04-14 DIAGNOSIS — C3491 Malignant neoplasm of unspecified part of right bronchus or lung: Secondary | ICD-10-CM

## 2020-04-14 DIAGNOSIS — Z85118 Personal history of other malignant neoplasm of bronchus and lung: Secondary | ICD-10-CM | POA: Insufficient documentation

## 2020-04-14 DIAGNOSIS — D693 Immune thrombocytopenic purpura: Secondary | ICD-10-CM | POA: Diagnosis not present

## 2020-04-14 DIAGNOSIS — C3431 Malignant neoplasm of lower lobe, right bronchus or lung: Secondary | ICD-10-CM

## 2020-04-14 LAB — CBC WITH DIFFERENTIAL/PLATELET
Abs Immature Granulocytes: 0.03 10*3/uL (ref 0.00–0.07)
Basophils Absolute: 0 10*3/uL (ref 0.0–0.1)
Basophils Relative: 1 %
Eosinophils Absolute: 0.1 10*3/uL (ref 0.0–0.5)
Eosinophils Relative: 1 %
HCT: 42.2 % (ref 36.0–46.0)
Hemoglobin: 13.5 g/dL (ref 12.0–15.0)
Immature Granulocytes: 1 %
Lymphocytes Relative: 30 %
Lymphs Abs: 1.8 10*3/uL (ref 0.7–4.0)
MCH: 29 pg (ref 26.0–34.0)
MCHC: 32 g/dL (ref 30.0–36.0)
MCV: 90.8 fL (ref 80.0–100.0)
Monocytes Absolute: 0.5 10*3/uL (ref 0.1–1.0)
Monocytes Relative: 8 %
Neutro Abs: 3.6 10*3/uL (ref 1.7–7.7)
Neutrophils Relative %: 59 %
Platelets: 190 10*3/uL (ref 150–400)
RBC: 4.65 MIL/uL (ref 3.87–5.11)
RDW: 14.1 % (ref 11.5–15.5)
WBC: 6 10*3/uL (ref 4.0–10.5)
nRBC: 0 % (ref 0.0–0.2)

## 2020-04-14 LAB — COMPREHENSIVE METABOLIC PANEL
ALT: 12 U/L (ref 0–44)
AST: 20 U/L (ref 15–41)
Albumin: 3.8 g/dL (ref 3.5–5.0)
Alkaline Phosphatase: 63 U/L (ref 38–126)
Anion gap: 9 (ref 5–15)
BUN: 7 mg/dL (ref 6–20)
CO2: 26 mmol/L (ref 22–32)
Calcium: 9.1 mg/dL (ref 8.9–10.3)
Chloride: 103 mmol/L (ref 98–111)
Creatinine, Ser: 0.47 mg/dL (ref 0.44–1.00)
GFR, Estimated: >60 mL/min (ref >60)
Glucose, Bld: 135 mg/dL — ABNORMAL HIGH (ref 70–99)
Potassium: 4 mmol/L (ref 3.5–5.1)
Sodium: 138 mmol/L (ref 135–145)
Total Bilirubin: 0.4 mg/dL (ref 0.3–1.2)
Total Protein: 7.3 g/dL (ref 6.5–8.1)

## 2020-04-14 MED ORDER — HEPARIN SOD (PORK) LOCK FLUSH 100 UNIT/ML IV SOLN
INTRAVENOUS | Status: AC
  Administered 2020-04-14: 500 [IU]
  Filled 2020-04-14: qty 5

## 2020-04-14 MED ORDER — IOHEXOL 300 MG/ML  SOLN
75.0000 mL | Freq: Once | INTRAMUSCULAR | Status: AC | PRN
  Administered 2020-04-14: 75 mL via INTRAVENOUS

## 2020-04-19 ENCOUNTER — Other Ambulatory Visit: Payer: Self-pay

## 2020-04-19 ENCOUNTER — Inpatient Hospital Stay (HOSPITAL_BASED_OUTPATIENT_CLINIC_OR_DEPARTMENT_OTHER): Payer: Medicaid Other | Admitting: Hematology

## 2020-04-19 VITALS — BP 110/53 | HR 96 | Temp 97.3°F | Resp 18 | Wt 166.0 lb

## 2020-04-19 DIAGNOSIS — Z85118 Personal history of other malignant neoplasm of bronchus and lung: Secondary | ICD-10-CM | POA: Diagnosis not present

## 2020-04-19 DIAGNOSIS — C3491 Malignant neoplasm of unspecified part of right bronchus or lung: Secondary | ICD-10-CM

## 2020-04-19 MED ORDER — LEVOFLOXACIN 500 MG PO TABS: 500.0000 mg | ORAL_TABLET | Freq: Every day | ORAL | 0 refills | Status: DC

## 2020-04-19 NOTE — Patient Instructions (Signed)
[place] at [facility] Discharge Instructions  You were seen today by Dr. Delton Coombes. He went over your recent results and scans. You will be prescribed Levaquin 500 mg to take 1 tablet daily for 7 days. You will be scheduled to have a CT scan of your chest done before your next visit. Dr. Delton Coombes will see you back in 4 months for labs and follow up.   Thank you for choosing [place] at [facility] to provide your oncology and hematology care.  To afford each patient quality time with our provider, please arrive at least 15 minutes before your scheduled appointment time.   If you have a lab appointment with the [facility] please come in thru the Main Entrance and check in at the main information desk  You need to re-schedule your appointment should you arrive 10 or more minutes late.  We strive to give you quality time with our providers, and arriving late affects you and other patients whose appointments are after yours.  Also, if you no show three or more times for appointments you may be dismissed from the clinic at the providers discretion.     Again, thank you for choosing [facility].  Our hope is that these requests will decrease the amount of time that you wait before being seen by our physicians.       _____________________________________________________________  Should you have questions after your visit to [facility], please contact our office at [phone] between the hours of 8:00 a.m. and 4:30 p.m.  Voicemails left after 4:00 p.m. will not be returned until the following business day.  For prescription refill requests, have your pharmacy contact our office and allow 72 hours.    Cancer Center Support Programs:   > Cancer Support Group  2nd Tuesday of the month 1pm-2pm, Journey Room

## 2020-04-19 NOTE — Progress Notes (Signed)
[facility] [address], [place] [address]   CLINIC:  Medical Oncology/Hematology  PCP:  Celene Squibb, MD [name] [place] [address] [phone]   REASON FOR VISIT:  Follow-up for stage III right lung cancer  PRIOR THERAPY:  1. Right lower lobectomy on 06/19/2019. 2. Carboplatin and pemetrexed x 4 cycles from 09/09/2019 to 11/11/2019.  NGS Results: PD-L1 30%, Foundation 1 MS--stable, TMB 9 Muts/Mb  CURRENT THERAPY: Surveillance  BRIEF ONCOLOGIC HISTORY:  Oncology History  Adenocarcinoma of lung, stage 3, right ([facility])  07/02/2019 Initial Diagnosis   Adenocarcinoma of lung, stage 3, right ([facility])   07/18/2019 Genetic Testing   Foundation One     07/23/2019 Genetic Testing   PD-L1     09/09/2019 -  Chemotherapy   The patient had palonosetron (ALOXI) injection 0.25 mg, 0.25 mg, Intravenous,  Once, 4 of 4 cycles Administration: 0.25 mg (09/09/2019), 0.25 mg (10/21/2019), 0.25 mg (11/11/2019), 0.25 mg (09/30/2019) PEMEtrexed (ALIMTA) 700 mg in sodium chloride 0.9 % 100 mL chemo infusion, 400 mg/m2 = 700 mg (80 % of original dose 500 mg/m2), Intravenous,  Once, 4 of 4 cycles Dose modification: 400 mg/m2 (80 % of original dose 500 mg/m2, Cycle 1, Reason: Provider Judgment) Administration: 700 mg (09/09/2019), 900 mg (10/21/2019), 900 mg (11/11/2019), 900 mg (09/30/2019) CARBOplatin (PARAPLATIN) 480 mg in sodium chloride 0.9 % 250 mL chemo infusion, 480 mg (80 % of original dose 601.5 mg), Intravenous,  Once, 4 of 4 cycles Dose modification:   (original dose 601.5 mg, Cycle 1), 481.2 mg (80 % of original dose 601.5 mg, Cycle 1, Reason: Provider Judgment),   (original dose 601.5 mg, Cycle 1),   (original dose 601.5 mg, Cycle 3),   (original dose 601.5 mg, Cycle 4),   (original dose 601.5 mg, Cycle 2) Administration: 480 mg (09/09/2019), 600 mg (10/21/2019), 600 mg (11/11/2019), 500 mg (09/30/2019) fosaprepitant (EMEND) 150 mg in sodium chloride 0.9 % 145  mL IVPB, 150 mg, Intravenous,  Once, 4 of 4 cycles Administration: 150 mg (09/09/2019), 150 mg (10/21/2019), 150 mg (11/11/2019), 150 mg (09/30/2019)  for chemotherapy treatment.      CANCER STAGING: Cancer Staging No matching staging information was found for the patient.  INTERVAL HISTORY:  [name], a 55 y.o. female, returns for routine follow-up of her stage III right lung cancer. Rachel Gross was last seen on 12/10/2019.   Today she reports feeling okay. She reports having COVID once and an upper respiratory infection 2 weeks ago which was producing sputum and she was prescribed prednisone which helped. She continues having occasional daily coughing with greenish sputum for the past 2 weeks.   REVIEW OF SYSTEMS:  Review of Systems  Constitutional: Negative for appetite change and fatigue.  Respiratory: Positive for cough (occasional w/ productive greenish sputum).   Gastrointestinal: Positive for constipation (x 3 per week) and nausea (daily).  Musculoskeletal: Positive for arthralgias and back pain (7/10 back pain).  All other systems reviewed and are negative.   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    Bipolar disorder ([facility])    Cancer (HCC)    right lung   COPD (chronic obstructive pulmonary disease) (HCC)    Depression    Dyspnea    Emphysema lung (HCC)  GERD (gastroesophageal reflux disease)    Hepatitis C    s/p treatment with Epclusa   History of HPV infection    History of pneumonia    Pneumonia    Smoker    Past Surgical History:  Procedure Laterality Date   BIOPSY  05/02/2018   Procedure: BIOPSY;  Surgeon: Daneil Dolin, MD;  [facility];  Service: Endoscopy;;  gastric   CESAREAN SECTION     CHEST TUBE INSERTION Right 06/26/2019   Procedure: Right  CHEST TUBE REPLACEMENT;  Surgeon: [name], MD;  Location: [facility];  Service: Thoracic;  Laterality: Right;   COLONOSCOPY WITH PROPOFOL N/A 03/03/2019   Procedure: COLONOSCOPY WITH PROPOFOL;  Surgeon: Daneil Dolin, MD;  [facility];  Service: Endoscopy;  Laterality: N/A;  9:15am   ESOPHAGOGASTRODUODENOSCOPY (EGD) WITH PROPOFOL N/A 05/02/2018   Procedure: ESOPHAGOGASTRODUODENOSCOPY (EGD) WITH PROPOFOL;  Surgeon: Daneil Dolin, MD;  [facility];  Service: Endoscopy;  Laterality: N/A;  8:30am   EYE SURGERY Bilateral    cataract   HEMOSTASIS CLIP PLACEMENT  03/03/2019   Procedure: HEMOSTASIS CLIP PLACEMENT;  Surgeon: Daneil Dolin, MD;  [facility];  Service: Endoscopy;;   INTERCOSTAL NERVE BLOCK Right 06/19/2019   Procedure: Intercostal Nerve Block;  Surgeon: [name], MD;  Location: [facility];  Service: Thoracic;  Laterality: Right;   IR US GUIDE VASC ACCESS RIGHT  03/13/2018   LOBECTOMY     LYMPH NODE DISSECTION Right 06/19/2019   Procedure: Lymph Node Dissection;  Surgeon: [name], MD;  Location: [facility];  Service: Thoracic;  Laterality: Right;   POLYPECTOMY  03/03/2019   Procedure: POLYPECTOMY;  Surgeon: Daneil Dolin, MD;  [facility];  Service: Endoscopy;;   PORTACATH PLACEMENT Left 09/08/2019   Procedure: INSERTION PORT-A-CATH LEFT CHEST (attached catheter in left subclavian);  Surgeon: Aviva Signs, MD;  Location: [facility];  Service: General;  Laterality: Left;   TONSILLECTOMY     TOOTH EXTRACTION  01/31/2018   x 7   VIDEO BRONCHOSCOPY WITH INSERTION OF INTERBRONCHIAL VALVE (IBV) N/A 06/26/2019   Procedure: VIDEO BRONCHOSCOPY WITH INSERTION OF TWO INTERBRONCHIAL VALVE (IBV);  Surgeon: [name], MD;  Location: [facility];  Service: Thoracic;  Laterality: N/A;   VIDEO BRONCHOSCOPY WITH INSERTION OF INTERBRONCHIAL VALVE (IBV) N/A 08/08/2019   Procedure: VIDEO BRONCHOSCOPY WITH REMOVAL OF  INTERBRONCHIAL VALVE (IBV);  Surgeon: [name], MD;  Location: [facility];  Service: Thoracic;  Laterality: N/A;    SOCIAL HISTORY:  Social History   Socioeconomic History   Marital status: Single    Spouse name: Not on file   Number of children: Not on file   Years of education: Not on file   Highest education level: Not on file  Occupational History   Not on file  Tobacco Use   Smoking status: Former Smoker    Packs/day: 0.50    Years: 38.00    Pack years: 19.00    Types: Cigarettes    Quit date: 06/19/2019    Years since quitting: 0.8   Smokeless tobacco: Never Used  Vaping Use   Vaping Use: Never used  Substance and Sexual Activity   Alcohol use: Not Currently    Comment: Last use of alcohol 03/2016   Drug use: Not Currently    Types: 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   Sexual activity: Not on file  Other Topics Concern   Not  on file  Social History Narrative   Not on file   Social Determinants of Health   Financial Resource Strain: Medium Risk   Difficulty of Paying Living Expenses: Somewhat hard  Food Insecurity: Food Insecurity Present   Worried About Charity fundraiser in the Last Year: Often true   Arboriculturist in the Last Year: Often true  Transportation Needs: No Transportation Needs   Lack of Transportation (Medical): No   Lack of Transportation (Non-Medical): No  Physical Activity: Sufficiently Active   Days of Exercise per Week: 3 days   Minutes of Exercise per Session: 150+ min  Stress: Stress Concern Present   Feeling of Stress : Very much  Social Connections: Socially Isolated   Frequency of Communication with Friends and Family: More than three times a week   Frequency of Social Gatherings with Friends and Family: Once a week   Attends Religious Services: Never   Marine scientist or Organizations: No   Attends Music therapist: Never   Marital Status: Divorced  Human resources officer Violence: Not  At Risk   Fear of Current or Ex-Partner: No   Emotionally Abused: No   Physically Abused: No   Sexually Abused: No    FAMILY HISTORY:  Family History  Problem Relation Age of Onset   Congenital heart disease Mother    Bipolar disorder Mother    Prostate cancer Brother    Alzheimer's disease Paternal Grandmother    Colon cancer Neg Hx     CURRENT MEDICATIONS:  Current Outpatient Medications  Medication Sig Dispense Refill   albuterol (VENTOLIN HFA) 108 (90 Base) MCG/ACT inhaler Inhale 2 puffs into the lungs every 4 (four) hours as needed for wheezing or shortness of breath.      dexamethasone (DECADRON) 6 MG tablet TAKE 1 TABLET BY MOUTH EVERY DAY FOR 10 DAYS     diphenhydrAMINE (BENADRYL) 25 MG tablet Take 25 mg by mouth at bedtime.      divalproex (DEPAKOTE) 250 MG DR tablet Take 250 mg by mouth at bedtime.      folic acid (FOLVITE) 1 MG tablet Take 1 mg by mouth daily.     levofloxacin (LEVAQUIN) 500 MG tablet Take 1 tablet (500 mg total) by mouth daily. 7 tablet 0   lidocaine-prilocaine (EMLA) cream Apply a small amount to port a cath site and cover with plastic wrap 1 hour prior to chemotherapy appointments 30 g 3   ondansetron (ZOFRAN ODT) 8 MG disintegrating tablet Place 1 tablet under tongue every 8 hours as needed for nausea. You may begin using this medication on the third day after each chemotherapy treatment. 20 tablet 1   pantoprazole (PROTONIX) 40 MG tablet TAKE 1 TABLET BY MOUTH DAILY BEFORE BREAKFAST 90 tablet 3   risperiDONE (RISPERDAL) 2 MG tablet Take 2 mg by mouth at bedtime.      scopolamine (TRANSDERM-SCOP) 1 MG/3DAYS Place 1 patch (1.5 mg total) onto the skin every 3 (three) days. 10 patch 12   SYMBICORT 160-4.5 MCG/ACT inhaler Inhale 2 puffs into the lungs 2 (two) times daily.     traMADol (ULTRAM) 50 MG tablet Take 1 tablet (50 mg total) by mouth daily as needed. 30 tablet 0   No current facility-administered medications for this visit.     ALLERGIES:  Allergies  Allergen Reactions   Folic Acid Anaphylaxis    Not entirely clear if it is from folic acid. She is taking folic acid now and doesn't  have the throat swelling anymore   Penicillins Anaphylaxis    Throat swelled Did it involve swelling of the face/tongue/throat, SOB, or low BP? Yes Did it involve sudden or severe rash/hives, skin peeling, or any reaction on the inside of your mouth or nose? No Did you need to seek medical attention at a hospital or doctor's office? No When did it last happen?childhood allergy If all above answers are NO, may proceed with cephalosporin use.    Amoxicillin     hallucinations Did it involve swelling of the face/tongue/throat, SOB, or low BP? No Did it involve sudden or severe rash/hives, skin peeling, or any reaction on the inside of your mouth or nose? No Did you need to seek medical attention at a hospital or doctor's office? Yes When did it last happen?July 2019 If all above answers are NO, may proceed with cephalosporin use.    Fish Allergy Nausea Only   Other Swelling    Patient states that the sunrise brand folic acid made her feel like her throat was swelling.     PHYSICAL EXAM:  Performance status (ECOG): 1 - Symptomatic but completely ambulatory  Vitals:   04/19/20 1453  BP: (!) 110/53  Pulse: 96  Resp: 18  Temp: (!) 97.3 F (36.3 C)  SpO2: 99%   Wt Readings from Last 3 Encounters:  04/19/20 166 lb (75.3 kg)  12/10/19 160 lb 3.2 oz (72.7 kg)  11/11/19 158 lb 9.6 oz (71.9 kg)   Physical Exam Vitals reviewed.  Constitutional:      Appearance: Normal appearance. She is obese.  Cardiovascular:     Rate and Rhythm: Normal rate and regular rhythm.     Pulses: Normal pulses.     Heart sounds: Normal heart sounds.  Pulmonary:     Effort: Pulmonary effort is normal.     Breath sounds: Normal breath sounds.  Neurological:     General: No focal deficit present.     Mental Status: She  is alert and oriented to person, place, and time.  Psychiatric:        Mood and Affect: Mood normal.        Behavior: Behavior normal.      LABORATORY DATA:  I have reviewed the labs as listed.  CBC Latest Ref Rng & Units 04/14/2020 12/03/2019 11/11/2019  WBC 4.0 - 10.5 K/uL 6.0 6.8 5.1  Hemoglobin 12.0 - 15.0 g/dL 13.5 11.6(L) 12.1  Hematocrit 36.0 - 46.0 % 42.2 35.7(L) 37.5  Platelets 150 - 400 K/uL 190 150 165   CMP Latest Ref Rng & Units 04/14/2020 12/03/2019 11/11/2019  Glucose 70 - 99 mg/dL 135(H) 105(H) 150(H)  BUN 6 - 20 mg/dL _0 Creatinine 0.44 - 1.00 mg/dL 0.47 0.65 0.45  Sodium 135 - 145 mmol/L 138 133(L) 136  Potassium 3.5 - 5.1 mmol/L 4.0 3.5 3.7  Chloride 98 - 111 mmol/L 103 101 98  CO2 22 - 32 mmol/L _1 Calcium 8.9 - 10.3 mg/dL 9.1 8.9 9.1  Total Protein 6.5 - 8.1 g/dL 7.3 7.2 7.1  Total Bilirubin 0.3 - 1.2 mg/dL 0.4 0.6 0.6  Alkaline Phos 38 - 126 U/L 63 61 55  AST 15 - 41 U/L _2 ALT 0 - 44 U/L _3 DIAGNOSTIC IMAGING:  I have independently reviewed the scans and discussed with the patient. CT Chest W Contrast  Result Date: 04/14/2020 CLINICAL DATA:  Metastatic non-small cell lung cancer. EXAM:  CT CHEST WITH CONTRAST TECHNIQUE: Multidetector CT imaging of the chest was performed during intravenous contrast administration. CONTRAST:  27m OMNIPAQUE IOHEXOL 300 MG/ML  SOLN COMPARISON:  12/03/2019. FINDINGS: Cardiovascular: The heart size is normal. No substantial pericardial effusion. Coronary artery calcification is evident. Atherosclerotic calcification is noted in the wall of the thoracic aorta. Mediastinum/Nodes: Low right paratracheal node documented as enlarging on the prior study has continued to progress, measuring 12 mm short axis today (47/2) compared to 9 mm short axis previously. Subcarinal node measured previously at 10 mm short axis is 15 mm short axis today (57/2). There is no hilar lymphadenopathy. The esophagus has normal imaging  features. There is no axillary lymphadenopathy. Lungs/Pleura: Volume loss in the right hemithorax is consistent with the history of prior surgery. Stable mixed attenuation posterior right loculated pleural fluid collection/pleural thickening. 3 mm medial right lung nodule on 41/4 is more prominent than on the previous exam. Bandlike atelectasis or scarring at the right base is similar. 8 mm inferior right lung nodule on 74/4 is similar to prior. There are new patchy and nodular ground-glass opacity in the right lung, with nodular components measuring up to 12 mm on image 81/4. 9 mm subpleural ground-glass nodule is seen in the left lower lobe on 84/4. Other similar ground-glass opacities are seen peripherally in the left lung. Upper Abdomen: Unremarkable. Musculoskeletal: No worrisome lytic or sclerotic osseous abnormality. IMPRESSION: 1. Interval development of patchy and nodular ground-glass opacity in the right lung, with nodular components measuring up to 12 mm. Subpleural ground-glass nodule opacities are seen in the left lung, new in the interval. Imaging features may be related to infectious/inflammatory etiology including atypical infection. Drug toxicity not excluded. 2. Interval progression of mediastinal lymphadenopathy. Potentially reactive, but continued progression is concerning. PET-CT may be warranted to further evaluate. 3. Stable bilateral pulmonary nodules. Continued attention on follow-up recommended. 4. Stable mixed attenuation posterior right pleural fluid collection/pleural thickening. 5. Aortic Atherosclerosis (ICD10-I70.0). Electronically Signed   By: EMisty StanleyM.D.   On: 04/14/2020 15:17     ASSESSMENT:  1.Stage IIIa (PT1CPN2) right lung adenocarcinoma: -PET scan on 05/13/2019 showed right lower lobe nodule concerning for bronchogenic carcinoma. Cirrhotic liver. -Right lower lobectomy and lymph node excision on 06/19/2019. -Pathology showed 1.7 cm right lower lobe  adenocarcinoma, unifocal, lymphovascular invasion present. Margins negative. 2/14 lymph nodes positive (level 7 and 10R). PT1CPN2. -PD-L1 30%, K-ras G12A, MSI stable. EGFR mutation not identified. -4 cycles of adjuvant carboplatin and pemetrexed from 09/09/2019 through 11/11/2019. -CT chest on 12/03/2019 shows small volume partially loculated right-sided pleural fluid inferiorly and medially.  Minimal increased density within the pleural fluid related to complexity.  Right paratracheal lymph node which is not pathologic by size criteria.  Similar left lower lobe 5 mm pulmonary nodule nonspecific.  Cirrhosis and portal venous hypertension.   PLAN:  1.Stage III right lung adenocarcinoma: -She reportedly had Covid infection x2 since last visit with me. -Most recent infection was 2 weeks ago, she is still having greenish expectoration with cough in the mornings. -Reviewed CT chest from 04/14/2020 which showed interval development of patchy nodular groundglass opacities in the right lung and subpleural groundglass opacities in the left lung.  Slight increase in size of mediastinal adenopathy by 3 to 5 mm, potentially reactive.  Stable bilateral lung nodules. -We will give her Levaquin for 7 days because of greenish expectoration.  We will plan to repeat CT scan in 4 months.  2. Immune mediated thrombocytopenia: -She was treated  for hep C infection in the past and also has cirrhosis. -Platelet count normalized at 190.  3.  Diffuse body pains and back pain: -Continue tramadol as needed.   Orders placed this encounter:  No orders of the defined types were placed in this encounter.    Derek Jack, MD [facility] [phone]   I, Milinda Antis, am acting as a scribe for Dr. Sanda Linger.  I, Derek Jack MD, have reviewed the above documentation for accuracy and completeness, and I agree with the above.

## 2020-06-07 ENCOUNTER — Inpatient Hospital Stay (HOSPITAL_COMMUNITY): Payer: Medicaid Other | Attending: Hematology

## 2020-06-07 ENCOUNTER — Other Ambulatory Visit: Payer: Self-pay

## 2020-06-07 DIAGNOSIS — Z85118 Personal history of other malignant neoplasm of bronchus and lung: Secondary | ICD-10-CM | POA: Diagnosis present

## 2020-06-07 DIAGNOSIS — I959 Hypotension, unspecified: Secondary | ICD-10-CM | POA: Insufficient documentation

## 2020-06-07 MED ORDER — FULVESTRANT 250 MG/5ML IM SOLN
INTRAMUSCULAR | Status: AC
  Filled 2020-06-07: qty 5

## 2020-06-07 MED ORDER — SODIUM CHLORIDE 0.9 % IV SOLN: INTRAVENOUS | Status: DC

## 2020-06-07 MED ORDER — HEPARIN SOD (PORK) LOCK FLUSH 100 UNIT/ML IV SOLN
500.0000 [IU] | Freq: Once | INTRAVENOUS | Status: AC
  Administered 2020-06-07: 500 [IU] via INTRAVENOUS

## 2020-06-07 MED ORDER — SODIUM CHLORIDE 0.9% FLUSH
10.0000 mL | INTRAVENOUS | Status: DC | PRN
  Administered 2020-06-07: 10 mL via INTRAVENOUS

## 2020-06-07 NOTE — Patient Instructions (Signed)
[facility]  Discharge Instructions: Thank you for choosing [facility] to provide your oncology and hematology care.  If you have a lab appointment with the [facility], please come in thru the Main Entrance and check in at the main information desk.  Wear comfortable clothing and clothing appropriate for easy access to any Portacath or PICC line.   Today you received fluids.    We strive to give you quality time with your provider. You may need to reschedule your appointment if you arrive late (15 or more minutes).  Arriving late affects you and other patients whose appointments are after yours.  Also, if you miss three or more appointments without notifying the office, you may be dismissed from the clinic at the provider's discretion.      For prescription refill requests, have your pharmacy contact our office and allow 72 hours for refills to be completed.    Items with * indicate a potential emergency and should be followed up as soon as possible or go to the Emergency Department if any problems should occur.  Please show the CHEMOTHERAPY ALERT CARD or IMMUNOTHERAPY ALERT CARD at check-in to the Emergency Department and triage nurse.  Should you have questions after your visit or need to cancel or reschedule your appointment, please contact [facility] [phone]  and follow the prompts.  Office hours are 8:00 a.m. to 4:30 p.m. Monday - Friday. Please note that voicemails left after 4:00 p.m. may not be returned until the following business day.  We are closed weekends and major holidays. You have access to a nurse at all times for urgent questions. Please call the main number to the clinic [phone] and follow the prompts.  For any non-urgent questions, you may also contact your provider using MyChart. We now offer e-Visits for anyone 21 and older to request care online for non-urgent symptoms. For details visit [email or web address].   Also  download the MyChart app! Go to the app store, search "MyChart", open the app, select [facility], and log in with your MyChart username and password.  Due to Covid, a mask is required upon entering the hospital/clinic. If you do not have a mask, one will be given to you upon arrival. For doctor visits, patients may have 1 support person aged 63 or older with them. For treatment visits, patients cannot have anyone with them due to current Covid guidelines and our immunocompromised population.

## 2020-06-07 NOTE — Progress Notes (Signed)
Patient here today for a port flush.  Good blood return noted with no bruising or swelling noted at the site.  Patient was deaccessed.  Band aid was applied and patient was stable.  Patient complained of lightheadedness and dizziness starting this morning.  Her BP was 99/33.  I spoke with Tarri Abernethy, PA and she advised orthostatic blood pressures.  No major drops in BP, but BP was consistently low.  Tarri Abernethy, PA advised 500 mL NS over one hour.  Patient agreed.  Patient's port was re-accessed with good blood return noted and no bruising or swelling at the site. Fluids were administered over one hour.  Patient tolerated well with no complaints voiced or signs of distress.  Patient was advised to hydrate well and to see PCP soon per Tarri Abernethy, PA.  Patient left ambulatory in stable condition.

## 2020-08-17 ENCOUNTER — Encounter (HOSPITAL_COMMUNITY): Payer: Self-pay

## 2020-08-17 ENCOUNTER — Other Ambulatory Visit: Payer: Self-pay

## 2020-08-17 ENCOUNTER — Inpatient Hospital Stay (HOSPITAL_COMMUNITY): Payer: Medicaid Other | Attending: Hematology

## 2020-08-17 ENCOUNTER — Ambulatory Visit (HOSPITAL_COMMUNITY)
Admission: RE | Admit: 2020-08-17 | Discharge: 2020-08-17 | Disposition: A | Discharge status: Home / Self Care | Payer: Medicaid Other | Source: Ambulatory Visit | Attending: Hematology | Admitting: Hematology

## 2020-08-17 DIAGNOSIS — C3491 Malignant neoplasm of unspecified part of right bronchus or lung: Secondary | ICD-10-CM

## 2020-08-17 DIAGNOSIS — Z452 Encounter for adjustment and management of vascular access device: Secondary | ICD-10-CM | POA: Insufficient documentation

## 2020-08-17 DIAGNOSIS — Z85118 Personal history of other malignant neoplasm of bronchus and lung: Secondary | ICD-10-CM | POA: Insufficient documentation

## 2020-08-17 DIAGNOSIS — D693 Immune thrombocytopenic purpura: Secondary | ICD-10-CM | POA: Insufficient documentation

## 2020-08-17 LAB — CBC WITH DIFFERENTIAL/PLATELET
Abs Immature Granulocytes: 0.02 10*3/uL (ref 0.00–0.07)
Basophils Absolute: 0.1 10*3/uL (ref 0.0–0.1)
Basophils Relative: 1 %
Eosinophils Absolute: 0.1 10*3/uL (ref 0.0–0.5)
Eosinophils Relative: 1 %
HCT: 43.9 % (ref 36.0–46.0)
Hemoglobin: 14.6 g/dL (ref 12.0–15.0)
Immature Granulocytes: 0 %
Lymphocytes Relative: 23 %
Lymphs Abs: 2.1 10*3/uL (ref 0.7–4.0)
MCH: 30.7 pg (ref 26.0–34.0)
MCHC: 33.3 g/dL (ref 30.0–36.0)
MCV: 92.4 fL (ref 80.0–100.0)
Monocytes Absolute: 0.6 10*3/uL (ref 0.1–1.0)
Monocytes Relative: 6 %
Neutro Abs: 6.4 10*3/uL (ref 1.7–7.7)
Neutrophils Relative %: 69 %
Platelets: 183 10*3/uL (ref 150–400)
RBC: 4.75 MIL/uL (ref 3.87–5.11)
RDW: 15.1 % (ref 11.5–15.5)
WBC: 9.2 10*3/uL (ref 4.0–10.5)
nRBC: 0 % (ref 0.0–0.2)

## 2020-08-17 LAB — COMPREHENSIVE METABOLIC PANEL
ALT: 11 U/L (ref 0–44)
AST: 16 U/L (ref 15–41)
Albumin: 3.9 g/dL (ref 3.5–5.0)
Alkaline Phosphatase: 50 U/L (ref 38–126)
Anion gap: 8 (ref 5–15)
BUN: 6 mg/dL (ref 6–20)
CO2: 28 mmol/L (ref 22–32)
Calcium: 9.1 mg/dL (ref 8.9–10.3)
Chloride: 98 mmol/L (ref 98–111)
Creatinine, Ser: 0.57 mg/dL (ref 0.44–1.00)
GFR, Estimated: >60 mL/min (ref >60)
Glucose, Bld: 103 mg/dL — ABNORMAL HIGH (ref 70–99)
Potassium: 3.9 mmol/L (ref 3.5–5.1)
Sodium: 134 mmol/L — ABNORMAL LOW (ref 135–145)
Total Bilirubin: 0.6 mg/dL (ref 0.3–1.2)
Total Protein: 7.6 g/dL (ref 6.5–8.1)

## 2020-08-17 MED ORDER — HEPARIN SOD (PORK) LOCK FLUSH 100 UNIT/ML IV SOLN
INTRAVENOUS | Status: AC
  Filled 2020-08-17: qty 5

## 2020-08-17 MED ORDER — IOHEXOL 300 MG/ML  SOLN
75.0000 mL | Freq: Once | INTRAMUSCULAR | Status: AC | PRN
  Administered 2020-08-17: 75 mL via INTRAVENOUS

## 2020-08-17 MED ORDER — HEPARIN SOD (PORK) LOCK FLUSH 100 UNIT/ML IV SOLN: 500.0000 [IU] | Freq: Once | INTRAVENOUS | Status: DC

## 2020-08-17 MED ORDER — SODIUM CHLORIDE 0.9% FLUSH
10.0000 mL | INTRAVENOUS | Status: DC | PRN
  Administered 2020-08-17: 10 mL via INTRAVENOUS

## 2020-08-17 NOTE — Progress Notes (Signed)
Patients port flushed without difficulty.  Good blood return noted with no bruising or swelling noted at site.  Patient remains accessed for CT scan.  Patient left ambulatory in stable condition with no complaints voiced.

## 2020-08-23 ENCOUNTER — Ambulatory Visit (HOSPITAL_COMMUNITY): Payer: Medicaid Other | Admitting: Hematology

## 2020-08-24 ENCOUNTER — Ambulatory Visit (HOSPITAL_COMMUNITY): Payer: Medicaid Other | Admitting: Hematology and Oncology

## 2020-08-26 ENCOUNTER — Other Ambulatory Visit: Payer: Self-pay

## 2020-08-26 ENCOUNTER — Inpatient Hospital Stay (HOSPITAL_COMMUNITY): Payer: Medicaid Other | Admitting: Nurse Practitioner

## 2020-08-26 VITALS — BP 104/79 | HR 84 | Temp 96.8°F | Resp 18 | Wt 159.5 lb

## 2020-08-26 DIAGNOSIS — C3491 Malignant neoplasm of unspecified part of right bronchus or lung: Secondary | ICD-10-CM

## 2020-08-26 DIAGNOSIS — D693 Immune thrombocytopenic purpura: Secondary | ICD-10-CM | POA: Diagnosis not present

## 2020-08-26 DIAGNOSIS — J449 Chronic obstructive pulmonary disease, unspecified: Secondary | ICD-10-CM | POA: Diagnosis not present

## 2020-08-26 DIAGNOSIS — Z85118 Personal history of other malignant neoplasm of bronchus and lung: Secondary | ICD-10-CM | POA: Diagnosis not present

## 2020-08-26 MED ORDER — SYMBICORT 160-4.5 MCG/ACT IN AERO: 2.0000 | INHALATION_SPRAY | Freq: Two times a day (BID) | RESPIRATORY_TRACT | 6 refills | Status: DC

## 2020-08-26 NOTE — Progress Notes (Signed)
Rachel Gross [address] [place] [address]   CLINIC:  Medical Oncology/Hematology  PCP:  Celene Squibb, MD [address] [name] [address] [phone]   REASON FOR VISIT:  Follow-up for stage III right lung cancer  PRIOR THERAPY:  1. Right lower lobectomy on 06/19/2019. 2. Carboplatin and pemetrexed x 4 cycles from 09/09/2019 to 11/11/2019.  NGS Results: PD-L1 30%, Foundation 1 MS--stable, TMB 9 Muts/Mb  CURRENT THERAPY: Surveillance  BRIEF ONCOLOGIC HISTORY:  Oncology History  Adenocarcinoma of lung, stage 3, right ([facility])  07/02/2019 Initial Diagnosis   Adenocarcinoma of lung, stage 3, right ([place])   07/18/2019 Genetic Testing   Foundation One     07/23/2019 Genetic Testing   PD-L1     09/09/2019 -  Chemotherapy   The patient had palonosetron (ALOXI) injection 0.25 mg, 0.25 mg, Intravenous,  Once, 4 of 4 cycles Administration: 0.25 mg (09/09/2019), 0.25 mg (10/21/2019), 0.25 mg (11/11/2019), 0.25 mg (09/30/2019) PEMEtrexed (ALIMTA) 700 mg in sodium chloride 0.9 % 100 mL chemo infusion, 400 mg/m2 = 700 mg (80 % of original dose 500 mg/m2), Intravenous,  Once, 4 of 4 cycles Dose modification: 400 mg/m2 (80 % of original dose 500 mg/m2, Cycle 1, Reason: Provider Judgment) Administration: 700 mg (09/09/2019), 900 mg (10/21/2019), 900 mg (11/11/2019), 900 mg (09/30/2019) CARBOplatin (PARAPLATIN) 480 mg in sodium chloride 0.9 % 250 mL chemo infusion, 480 mg (80 % of original dose 601.5 mg), Intravenous,  Once, 4 of 4 cycles Dose modification:   (original dose 601.5 mg, Cycle 1), 481.2 mg (80 % of original dose 601.5 mg, Cycle 1, Reason: Provider Judgment),   (original dose 601.5 mg, Cycle 1),   (original dose 601.5 mg, Cycle 3),   (original dose 601.5 mg, Cycle 4),   (original dose 601.5 mg, Cycle 2) Administration: 480 mg (09/09/2019), 600 mg (10/21/2019), 600 mg (11/11/2019), 500 mg (09/30/2019) fosaprepitant (EMEND) 150 mg in sodium chloride 0.9 % 145  mL IVPB, 150 mg, Intravenous,  Once, 4 of 4 cycles Administration: 150 mg (09/09/2019), 150 mg (10/21/2019), 150 mg (11/11/2019), 150 mg (09/30/2019)  for chemotherapy treatment.      CANCER STAGING: Cancer Staging No matching staging information was found for the patient.  INTERVAL HISTORY: [name], a 55 y.o. female, who returns to clinic for routine follow-up for history of stage III right lung cancer.  She reports decreased appetite but weight is stable over the past year.  She has chronic shortness of breath with exertion.  Chronic dizziness and headache which is stable and unchanged.  No recent fevers or chills or recent illness or hospitalization.  Denies easy bleeding or bruising.  No melena hematochezia.  No chest pain.  Shortness of breath is stable and unchanged.  No nausea, vomiting, constipation, diarrhea.  No urinary complaints.  No hemoptysis.  No further specific complaints today.  By her report mammograms and colonoscopy are up-to-date.  REVIEW OF SYSTEMS:  Review of Systems  Constitutional:  Positive for appetite change. Negative for chills, fatigue, fever and unexpected weight change.  HENT:   Negative for mouth sores, sore throat and trouble swallowing.   Respiratory:  Positive for shortness of breath (with exertion). Negative for chest tightness, cough, hemoptysis and wheezing.   Cardiovascular:  Negative for leg swelling.  Gastrointestinal:  Negative for abdominal pain, constipation, diarrhea, nausea and vomiting.  Genitourinary:  Negative for bladder incontinence and dysuria.   Musculoskeletal:  Negative for flank pain and neck  stiffness.  Skin:  Negative for itching, rash and wound.  Neurological:  Negative for dizziness, headaches, light-headedness and numbness.  Hematological:  Negative for adenopathy. Does not bruise/bleed easily.  Psychiatric/Behavioral:  Negative for confusion, depression and sleep disturbance. The patient is nervous/anxious.    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    Bipolar disorder ([place])    Cancer (HCC)    right lung   COPD (chronic obstructive pulmonary disease) (HCC)    Depression    Dyspnea    Emphysema lung (HCC)    GERD (gastroesophageal reflux disease)    Hepatitis C    s/p treatment with Epclusa   History of HPV infection    History of pneumonia    Pneumonia    Smoker    Past Surgical History:  Procedure Laterality Date   BIOPSY  05/02/2018   Procedure: BIOPSY;  Surgeon: Daneil Dolin, MD;  [facility];  Service: Endoscopy;;  gastric   CESAREAN SECTION     CHEST TUBE INSERTION Right 06/26/2019   Procedure: Right CHEST TUBE REPLACEMENT;  Surgeon: [name], MD;  Location: [facility];  Service: Thoracic;  Laterality: Right;   COLONOSCOPY WITH PROPOFOL N/A 03/03/2019   Procedure: COLONOSCOPY WITH PROPOFOL;  Surgeon: Daneil Dolin, MD;  [facility];  Service: Endoscopy;  Laterality: N/A;  9:15am   ESOPHAGOGASTRODUODENOSCOPY (EGD) WITH PROPOFOL N/A 05/02/2018   Procedure: ESOPHAGOGASTRODUODENOSCOPY (EGD) WITH PROPOFOL;  Surgeon: Daneil Dolin, MD;  [facility];  Service: Endoscopy;  Laterality: N/A;  8:30am   EYE SURGERY Bilateral    cataract   HEMOSTASIS CLIP PLACEMENT  03/03/2019   Procedure: HEMOSTASIS CLIP PLACEMENT;  Surgeon: Daneil Dolin, MD;  [facility];  Service: Endoscopy;;   INTERCOSTAL NERVE BLOCK Right 06/19/2019   Procedure: Intercostal Nerve Block;  Surgeon: [name], MD;  Location: [facility];  Service: Thoracic;  Laterality: Right;   IR US GUIDE VASC ACCESS RIGHT  03/13/2018   LOBECTOMY     LYMPH NODE DISSECTION Right 06/19/2019   Procedure: Lymph Node Dissection;  Surgeon: [name], MD;  Location: [facility];  Service: Thoracic;  Laterality:  Right;   POLYPECTOMY  03/03/2019   Procedure: POLYPECTOMY;  Surgeon: Daneil Dolin, MD;  [facility];  Service: Endoscopy;;   PORTACATH PLACEMENT Left 09/08/2019   Procedure: INSERTION PORT-A-CATH LEFT CHEST (attached catheter in left subclavian);  Surgeon: Aviva Signs, MD;  Location[facility];  Service: General;  Laterality: Left;   TONSILLECTOMY     TOOTH EXTRACTION  01/31/2018   x 7   VIDEO BRONCHOSCOPY WITH INSERTION OF INTERBRONCHIAL VALVE (IBV) N/A 06/26/2019   Procedure: VIDEO BRONCHOSCOPY WITH INSERTION OF TWO INTERBRONCHIAL VALVE (IBV);  Surgeon: [name], MD;  Location[facility];  Service: Thoracic;  Laterality: N/A;   VIDEO BRONCHOSCOPY WITH INSERTION OF INTERBRONCHIAL VALVE (IBV) N/A 08/08/2019   Procedure: VIDEO BRONCHOSCOPY WITH REMOVAL OF INTERBRONCHIAL VALVE (IBV);  Surgeon: [name], MD;  Location: [facility];  Service: Thoracic;  Laterality: N/A;    SOCIAL HISTORY:  Social History   Socioeconomic History   Marital status: Single    Spouse name: Not on file   Number of children: Not on file   Years of education: Not  on file   Highest education level: Not on file  Occupational History   Not on file  Tobacco Use   Smoking status: Former    Packs/day: 0.50    Years: 38.00    Pack years: 19.00    Types: Cigarettes    Quit date: 06/19/2019    Years since quitting: 1.1   Smokeless tobacco: Never  Vaping Use   Vaping Use: Never used  Substance and Sexual Activity   Alcohol use: Not Currently    Comment: Last use of alcohol 03/2016   Drug use: Not Currently    Types: 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   Sexual activity: Not on file  Other Topics Concern   Not on file  Social History Narrative   Not on file   Social Determinants of Health   Financial Resource Strain: Medium Risk   Difficulty of Paying Living Expenses: Somewhat hard  Food Insecurity: Food Insecurity Present   Worried About Charity fundraiser in the Last Year: Often true    Arboriculturist in the Last Year: Often true  Transportation Needs: No Transportation Needs   Lack of Transportation (Medical): No   Lack of Transportation (Non-Medical): No  Physical Activity: Sufficiently Active   Days of Exercise per Week: 3 days   Minutes of Exercise per Session: 150+ min  Stress: Stress Concern Present   Feeling of Stress : Very much  Social Connections: Socially Isolated   Frequency of Communication with Friends and Family: More than three times a week   Frequency of Social Gatherings with Friends and Family: Once a week   Attends Religious Services: Never   Marine scientist or Organizations: No   Attends Music therapist: Never   Marital Status: Divorced  Human resources officer Violence: Not At Risk   Fear of Current or Ex-Partner: No   Emotionally Abused: No   Physically Abused: No   Sexually Abused: No    FAMILY HISTORY:  Family History  Problem Relation Age of Onset   Congenital heart disease Mother    Bipolar disorder Mother    Prostate cancer Brother    Alzheimer's disease Paternal Grandmother    Colon cancer Neg Hx     CURRENT MEDICATIONS:  Current Outpatient Medications  Medication Sig Dispense Refill   albuterol (VENTOLIN HFA) 108 (90 Base) MCG/ACT inhaler Inhale 2 puffs into the lungs every 4 (four) hours as needed for wheezing or shortness of breath.      dexamethasone (DECADRON) 6 MG tablet TAKE 1 TABLET BY MOUTH EVERY DAY FOR 10 DAYS     diphenhydrAMINE (BENADRYL) 25 MG tablet Take 25 mg by mouth at bedtime.      divalproex (DEPAKOTE) 250 MG DR tablet Take 250 mg by mouth at bedtime.      folic acid (FOLVITE) 1 MG tablet Take 1 mg by mouth daily.     lidocaine-prilocaine (EMLA) cream Apply a small amount to port a cath site and cover with plastic wrap 1 hour prior to chemotherapy appointments 30 g 3   ondansetron (ZOFRAN ODT) 8 MG disintegrating tablet Place 1 tablet under tongue every 8 hours as needed for nausea. You may  begin using this medication on the third day after each chemotherapy treatment. 20 tablet 1   pantoprazole (PROTONIX) 40 MG tablet TAKE 1 TABLET BY MOUTH DAILY BEFORE BREAKFAST 90 tablet 3   risperiDONE (RISPERDAL) 2 MG tablet Take 2 mg by mouth at bedtime.  scopolamine (TRANSDERM-SCOP) 1 MG/3DAYS Place 1 patch (1.5 mg total) onto the skin every 3 (three) days. 10 patch 12   SYMBICORT 160-4.5 MCG/ACT inhaler Inhale 2 puffs into the lungs 2 (two) times daily.     traMADol (ULTRAM) 50 MG tablet Take 1 tablet (50 mg total) by mouth daily as needed. 30 tablet 0   No current facility-administered medications for this visit.    ALLERGIES:  Allergies  Allergen Reactions   Folic Acid Anaphylaxis    Not entirely clear if it is from folic acid. She is taking folic acid now and doesn't have the throat swelling anymore   Penicillins Anaphylaxis    Throat swelled Did it involve swelling of the face/tongue/throat, SOB, or low BP? Yes Did it involve sudden or severe rash/hives, skin peeling, or any reaction on the inside of your mouth or nose? No Did you need to seek medical attention at a hospital or doctor's office? No When did it last happen?      childhood allergy If all above answers are "NO", may proceed with cephalosporin use.    Amoxicillin     hallucinations Did it involve swelling of the face/tongue/throat, SOB, or low BP? No Did it involve sudden or severe rash/hives, skin peeling, or any reaction on the inside of your mouth or nose? No Did you need to seek medical attention at a hospital or doctor's office? Yes When did it last happen?      July 2019 If all above answers are "NO", may proceed with cephalosporin use.    Fish Allergy Nausea Only   Other Swelling    Patient states that the sunrise brand folic acid made her feel like her throat was swelling.     PHYSICAL EXAM:  Performance status (ECOG): 1 - Symptomatic but completely ambulatory  Vitals:   08/26/20 1024  BP:  104/79  Pulse: 84  Resp: 18  Temp: (!) 96.8 F (36 C)  SpO2: 98%   Wt Readings from Last 3 Encounters:  08/26/20 159 lb 8 oz (72.3 kg)  04/19/20 166 lb (75.3 kg)  12/10/19 160 lb 3.2 oz (72.7 kg)   Physical Exam Vitals reviewed.  Constitutional:      General: She is not in acute distress.    Appearance: Normal appearance. She is well-developed. She is not ill-appearing.  HENT:     Head: Atraumatic.     Nose: Nose normal.     Mouth/Throat:     Pharynx: No oropharyngeal exudate.  Eyes:     General: No scleral icterus.    Conjunctiva/sclera: Conjunctivae normal.  Cardiovascular:     Rate and Rhythm: Normal rate and regular rhythm.     Pulses: Normal pulses.     Heart sounds: Normal heart sounds.  Pulmonary:     Effort: Pulmonary effort is normal.     Breath sounds: Normal breath sounds.  Abdominal:     General: Bowel sounds are normal. There is no distension.     Palpations: Abdomen is soft.     Tenderness: no abdominal tenderness  Musculoskeletal:        General: No tenderness or deformity. Normal range of motion.     Cervical back: Normal range of motion and neck supple.  Skin:    General: Skin is warm and dry.     Coloration: Skin is not pale.  Neurological:     General: No focal deficit present.     Mental Status: She is alert and oriented to person,  place, and time.  Psychiatric:        Mood and Affect: Mood normal.        Behavior: Behavior normal.     LABORATORY DATA:  I have reviewed the labs as listed.  CBC Latest Ref Rng & Units 08/17/2020 04/14/2020 12/03/2019  WBC 4.0 - 10.5 K/uL 9.2 6.0 6.8  Hemoglobin 12.0 - 15.0 g/dL 14.6 13.5 11.6(L)  Hematocrit 36.0 - 46.0 % 43.9 42.2 35.7(L)  Platelets 150 - 400 K/uL 183 190 150   CMP Latest Ref Rng & Units 08/17/2020 04/14/2020 12/03/2019  Glucose 70 - 99 mg/dL 103(H) 135(H) 105(H)  BUN 6 - 20 mg/dL _0 Creatinine 0.44 - 1.00 mg/dL 0.57 0.47 0.65  Sodium 135 - 145 mmol/L 134(L) 138 133(L)  Potassium 3.5 -  5.1 mmol/L 3.9 4.0 3.5  Chloride 98 - 111 mmol/L 98 103 101  CO2 22 - 32 mmol/L _1 Calcium 8.9 - 10.3 mg/dL 9.1 9.1 8.9  Total Protein 6.5 - 8.1 g/dL 7.6 7.3 7.2  Total Bilirubin 0.3 - 1.2 mg/dL 0.6 0.4 0.6  Alkaline Phos 38 - 126 U/L 50 63 61  AST 15 - 41 U/L _2 ALT 0 - 44 U/L _3 DIAGNOSTIC IMAGING:  I have independently reviewed the scans and discussed with the patient. CT Chest W Contrast  Result Date: 08/18/2020 CLINICAL DATA:  Restaging non-small cell lung cancer. History of right lower lobe lobectomy in May 2021. EXAM: CT CHEST WITH CONTRAST TECHNIQUE: Multidetector CT imaging of the chest was performed during intravenous contrast administration. CONTRAST:  51m OMNIPAQUE IOHEXOL 300 MG/ML  SOLN COMPARISON:  Chest CT 04/14/2020 FINDINGS: Cardiovascular: The heart is normal in size. No pericardial effusion. Stable mild tortuosity, ectasia and calcification of the thoracic aorta and stable coronary artery calcifications. Mediastinum/Nodes: Stable borderline mediastinal and hilar lymph nodes. No new or progressive finding since the prior study. 9 mm precarinal on image 49/2 is unchanged. 12 mm subcarinal node on image 59/2 previously measured 15 mm. The esophagus is grossly normal. Lungs/Pleura: Surgical changes from a right lower lobe lobectomy. Small stable loculated medial pleural effusion at the right lung base. Stable 8 mm nodule at the right lung base on image number 74/4 No new or enlarging pulmonary nodules. Interval resolution of the ground-glass opacities seen on the prior study suggesting that was an inflammatory or infectious process. Upper Abdomen: No significant upper abdominal findings. Stable thickened medial limb of the left adrenal gland without discrete mass or nodule. No evidence of hepatic metastatic disease. Small scattered upper abdominal lymph nodes are stable. Musculoskeletal: No breast masses, supraclavicular or axillary adenopathy. The bony thorax  is intact. IMPRESSION: 1. Surgical changes from a right lower lobe lobectomy. 2. Stable borderline mediastinal and hilar lymph nodes. No new or progressive findings. 3. Stable 8 mm right lower lobe pulmonary nodule. Other smaller nodules are stable also. 4. Interval resolution of ground-glass opacities seen on the prior study suggesting that was an inflammatory or infectious process. 5. Stable thickened medial limb of the left adrenal gland without discrete mass or nodule. 6. Emphysema and aortic atherosclerosis. Aortic Atherosclerosis (ICD10-I70.0) and Emphysema (ICD10-J43.9). Electronically Signed   By: PMarijo SanesM.D.   On: 08/18/2020 15:37      ASSESSMENT & Plan:  1.  Stage IIIa (PT1CPN2) right lung adenocarcinoma: CT to follow pulmonary nodules showed progressive size increase in April 2021.  PET scan showed hypermetabolic right lower  lobe nodule concerning for primary bronchogenic carcinoma.  Patient underwent right lower lobectomy and lymph node excision on 06/19/2019.  Pathology showed 1.7 cm right lower lobe adenocarcinoma, unifocal, LVSI present.  Margins negative.  2 of 14 lymph nodes were positive.  PT1CPN2.  PD-L1 30%, K-ras G12A mutation. MSI stable.  EGFR mutation not identified.  She underwent 4 cycles of adjuvant carboplatin and pemetrexed from 09/09/2019-11/11/2019.  NED since.  I independently reviewed CT chest with contrast from 08/18/2019 and agree with stable postsurgical changes and no new evidence of malignancy or progressive disease.  Interval resolution of groundglass opacities consistent with prior COVID infection.  Clinically she is stable.  Recommend continued surveillance.  Per NCCN guidelines will recommend repeat chest CT with contrast in 3 months with physical exam following.  2.  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 Labs reviewed today. Platelet count remains normal.  Monitor.  3.  COPD and previous COVID infection-groundglass  opacities likely related to previous COVID infection and have resolved.  She is not currently on maintenance inhalers for known COPD.  We will refill today and I encouraged her to establish care with local PCP for ongoing management.  She is vaccinated and I encouraged her to receive her COVID booster today.  Disposition 3 mo- ct ct w contrast Few days later, lab (cbc, cmp), MD    Orders placed this encounter:  Orders Placed This Encounter  Procedures   CT Chest W Contrast    Beckey Rutter, DNP, AGNP-C

## 2020-08-27 NOTE — Addendum Note (Signed)
Addended by: Brien Mates on: 08/27/2020 08:59 AM   Modules accepted: Orders

## 2020-11-30 ENCOUNTER — Inpatient Hospital Stay (HOSPITAL_COMMUNITY): Payer: Medicaid Other | Attending: Hematology

## 2020-11-30 ENCOUNTER — Encounter (HOSPITAL_COMMUNITY): Payer: Self-pay | Admitting: Radiology

## 2020-11-30 ENCOUNTER — Inpatient Hospital Stay (HOSPITAL_COMMUNITY): Payer: Medicaid Other

## 2020-11-30 ENCOUNTER — Ambulatory Visit (HOSPITAL_COMMUNITY)
Admission: RE | Admit: 2020-11-30 | Discharge: 2020-11-30 | Disposition: A | Discharge status: Home / Self Care | Payer: Medicaid Other | Source: Ambulatory Visit | Attending: Nurse Practitioner | Admitting: Nurse Practitioner

## 2020-11-30 ENCOUNTER — Other Ambulatory Visit: Payer: Self-pay

## 2020-11-30 DIAGNOSIS — C3491 Malignant neoplasm of unspecified part of right bronchus or lung: Secondary | ICD-10-CM

## 2020-11-30 DIAGNOSIS — D693 Immune thrombocytopenic purpura: Secondary | ICD-10-CM

## 2020-11-30 LAB — CBC WITH DIFFERENTIAL/PLATELET
Abs Immature Granulocytes: 0.04 10*3/uL (ref 0.00–0.07)
Basophils Absolute: 0 10*3/uL (ref 0.0–0.1)
Basophils Relative: 0 %
Eosinophils Absolute: 0 10*3/uL (ref 0.0–0.5)
Eosinophils Relative: 0 %
HCT: 38 % (ref 36.0–46.0)
Hemoglobin: 13.4 g/dL (ref 12.0–15.0)
Immature Granulocytes: 0 %
Lymphocytes Relative: 17 %
Lymphs Abs: 2.3 10*3/uL (ref 0.7–4.0)
MCH: 33.3 pg (ref 26.0–34.0)
MCHC: 35.3 g/dL (ref 30.0–36.0)
MCV: 94.3 fL (ref 80.0–100.0)
Monocytes Absolute: 1.2 10*3/uL — ABNORMAL HIGH (ref 0.1–1.0)
Monocytes Relative: 9 %
Neutro Abs: 9.7 10*3/uL — ABNORMAL HIGH (ref 1.7–7.7)
Neutrophils Relative %: 74 %
Platelets: 174 10*3/uL (ref 150–400)
RBC: 4.03 MIL/uL (ref 3.87–5.11)
RDW: 14.2 % (ref 11.5–15.5)
WBC: 13.2 10*3/uL — ABNORMAL HIGH (ref 4.0–10.5)
nRBC: 0 % (ref 0.0–0.2)

## 2020-11-30 LAB — COMPREHENSIVE METABOLIC PANEL
ALT: 17 U/L (ref 0–44)
AST: 24 U/L (ref 15–41)
Albumin: 3.6 g/dL (ref 3.5–5.0)
Alkaline Phosphatase: 55 U/L (ref 38–126)
Anion gap: 10 (ref 5–15)
BUN: 7 mg/dL (ref 6–20)
CO2: 25 mmol/L (ref 22–32)
Calcium: 8.4 mg/dL — ABNORMAL LOW (ref 8.9–10.3)
Chloride: 95 mmol/L — ABNORMAL LOW (ref 98–111)
Creatinine, Ser: 0.46 mg/dL (ref 0.44–1.00)
GFR, Estimated: >60 mL/min (ref >60)
Glucose, Bld: 117 mg/dL — ABNORMAL HIGH (ref 70–99)
Potassium: 3.1 mmol/L — ABNORMAL LOW (ref 3.5–5.1)
Sodium: 130 mmol/L — ABNORMAL LOW (ref 135–145)
Total Bilirubin: 0.8 mg/dL (ref 0.3–1.2)
Total Protein: 7.3 g/dL (ref 6.5–8.1)

## 2020-11-30 MED ORDER — HEPARIN SOD (PORK) LOCK FLUSH 100 UNIT/ML IV SOLN
INTRAVENOUS | Status: AC
  Administered 2020-11-30: 500 [IU] via INTRAVENOUS
  Filled 2020-11-30: qty 5

## 2020-11-30 MED ORDER — HEPARIN SOD (PORK) LOCK FLUSH 100 UNIT/ML IV SOLN: 500.0000 [IU] | Freq: Once | INTRAVENOUS | Status: AC

## 2020-11-30 MED ORDER — IOHEXOL 300 MG/ML  SOLN
75.0000 mL | Freq: Once | INTRAMUSCULAR | Status: AC | PRN
  Administered 2020-11-30: 75 mL via INTRAVENOUS

## 2020-11-30 NOTE — Progress Notes (Signed)
Patients port flushed without difficulty.  Blood return noted with no bruising or swelling noted at site.  Labs drawn peripherally due to lack of blood flow from the port.  Patient to remain accessed for CT scan.

## 2020-11-30 NOTE — Patient Instructions (Signed)
[facility]  Discharge Instructions: Thank you for choosing [facility] to provide your oncology and hematology care.  If you have a lab appointment with the Cancer Center, please come in thru the Main Entrance and check in at the main information desk.  Wear comfortable clothing and clothing appropriate for easy access to any Portacath or PICC line.   We strive to give you quality time with your provider. You may need to reschedule your appointment if you arrive late (15 or more minutes).  Arriving late affects you and other patients whose appointments are after yours.  Also, if you miss three or more appointments without notifying the office, you may be dismissed from the clinic at the provider's discretion.      For prescription refill requests, have your pharmacy contact our office and allow 72 hours for refills to be completed.        To help prevent nausea and vomiting after your treatment, we encourage you to take your nausea medication as directed.  BELOW ARE SYMPTOMS THAT SHOULD BE REPORTED IMMEDIATELY: *FEVER GREATER THAN 100.4 F (38 C) OR HIGHER *CHILLS OR SWEATING *NAUSEA AND VOMITING THAT IS NOT CONTROLLED WITH YOUR NAUSEA MEDICATION *UNUSUAL SHORTNESS OF BREATH *UNUSUAL BRUISING OR BLEEDING *URINARY PROBLEMS (pain or burning when urinating, or frequent urination) *BOWEL PROBLEMS (unusual diarrhea, constipation, pain near the anus) TENDERNESS IN MOUTH AND THROAT WITH OR WITHOUT PRESENCE OF ULCERS (sore throat, sores in mouth, or a toothache) UNUSUAL RASH, SWELLING OR PAIN  UNUSUAL VAGINAL DISCHARGE OR ITCHING   Items with * indicate a potential emergency and should be followed up as soon as possible or go to the Emergency Department if any problems should occur.  Please show the CHEMOTHERAPY ALERT CARD or IMMUNOTHERAPY ALERT CARD at check-in to the Emergency Department and triage nurse.  Should you have questions after your visit or need to cancel  or reschedule your appointment, please contact [facility] [phone]  and follow the prompts.  Office hours are 8:00 a.m. to 4:30 p.m. Monday - Friday. Please note that voicemails left after 4:00 p.m. may not be returned until the following business day.  We are closed weekends and major holidays. You have access to a nurse at all times for urgent questions. Please call the main number to the clinic [phone] and follow the prompts.  For any non-urgent questions, you may also contact your provider using MyChart. We now offer e-Visits for anyone 18 and older to request care online for non-urgent symptoms. For details visit [email or web address].   Also download the MyChart app! Go to the app store, search "MyChart", open the app, select [facility], and log in with your MyChart username and password.  Due to Covid, a mask is required upon entering the hospital/clinic. If you do not have a mask, one will be given to you upon arrival. For doctor visits, patients may have 1 support person aged 18 or older with them. For treatment visits, patients cannot have anyone with them due to current Covid guidelines and our immunocompromised population.  

## 2020-12-06 NOTE — Progress Notes (Signed)
Rachel Gross [address], [place] [address]   CLINIC:  Medical Oncology/Hematology  PCP:  Celene Squibb, MD [address] [place] Gross [id number] [phone]   REASON FOR VISIT:  Follow-up for stage III right lung cancer  PRIOR THERAPY:  1. Right lower lobectomy on 06/19/2019. 2. Carboplatin and pemetrexed x 4 cycles from 09/09/2019 to 11/11/2019.  NGS Results: PD-L1 30%, Foundation 1 MS--stable, TMB 9 Muts/Mb  CURRENT THERAPY: Surveillance  BRIEF ONCOLOGIC HISTORY:  Oncology History  Adenocarcinoma of lung, stage 3, right ([place])  07/02/2019 Initial Diagnosis   Adenocarcinoma of lung, stage 3, right ([place])   07/18/2019 Genetic Testing   Foundation One     07/23/2019 Genetic Testing   PD-L1     09/09/2019 -  Chemotherapy   The patient had palonosetron (ALOXI) injection 0.25 mg, 0.25 mg, Intravenous,  Once, 4 of 4 cycles Administration: 0.25 mg (09/09/2019), 0.25 mg (10/21/2019), 0.25 mg (11/11/2019), 0.25 mg (09/30/2019) PEMEtrexed (ALIMTA) 700 mg in sodium chloride 0.9 % 100 mL chemo infusion, 400 mg/m2 = 700 mg (80 % of original dose 500 mg/m2), Intravenous,  Once, 4 of 4 cycles Dose modification: 400 mg/m2 (80 % of original dose 500 mg/m2, Cycle 1, Reason: Provider Judgment) Administration: 700 mg (09/09/2019), 900 mg (10/21/2019), 900 mg (11/11/2019), 900 mg (09/30/2019) CARBOplatin (PARAPLATIN) 480 mg in sodium chloride 0.9 % 250 mL chemo infusion, 480 mg (80 % of original dose 601.5 mg), Intravenous,  Once, 4 of 4 cycles Dose modification:   (original dose 601.5 mg, Cycle 1), 481.2 mg (80 % of original dose 601.5 mg, Cycle 1, Reason: Provider Judgment),   (original dose 601.5 mg, Cycle 1),   (original dose 601.5 mg, Cycle 3),   (original dose 601.5 mg, Cycle 4),   (original dose 601.5 mg, Cycle 2) Administration: 480 mg (09/09/2019), 600 mg (10/21/2019), 600 mg (11/11/2019), 500 mg (09/30/2019) fosaprepitant (EMEND) 150 mg in sodium chloride 0.9 % 145  mL IVPB, 150 mg, Intravenous,  Once, 4 of 4 cycles Administration: 150 mg (09/09/2019), 150 mg (10/21/2019), 150 mg (11/11/2019), 150 mg (09/30/2019)   for chemotherapy treatment.       CANCER STAGING: Cancer Staging No matching staging information was found for the patient.  INTERVAL HISTORY:  [name], a 55 y.o. female, returns for routine follow-up of her stage III right lung cancer. [name] was last seen on 08/26/2020.   Today she reports feeling fair. She reports decreased appetite and cough productive of green sputum.    REVIEW OF SYSTEMS:  Review of Systems  Constitutional:  Positive for appetite change (10%) and fatigue (depleted).  Respiratory:  Positive for cough (green sputum) and shortness of breath.   Gastrointestinal:  Positive for constipation, diarrhea and vomiting.  Neurological:  Positive for headaches.  Psychiatric/Behavioral:  Positive for depression and sleep disturbance. The patient is nervous/anxious.   All other systems reviewed and are negative.  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    Bipolar disorder ([place])    Cancer (HCC)    right lung   COPD (chronic obstructive pulmonary disease) (HCC)    Depression    Dyspnea    Emphysema lung (HCC)    GERD (gastroesophageal reflux disease)    Hepatitis C    s/p treatment  with Epclusa   History of HPV infection    History of pneumonia    Pneumonia    Smoker    Past Surgical History:  Procedure Laterality Date   BIOPSY  05/02/2018   Procedure: BIOPSY;  Surgeon: Daneil Dolin, MD;  [facility];  Service: Endoscopy;;  gastric   CESAREAN SECTION     CHEST TUBE INSERTION Right 06/26/2019   Procedure: Right CHEST TUBE REPLACEMENT;  Surgeon: [name], MD;  Location: [facility];  Service: Thoracic;  Laterality:  Right;   COLONOSCOPY WITH PROPOFOL N/A 03/03/2019   Procedure: COLONOSCOPY WITH PROPOFOL;  Surgeon: Daneil Dolin, MD;  [facility];  Service: Endoscopy;  Laterality: N/A;  9:15am   ESOPHAGOGASTRODUODENOSCOPY (EGD) WITH PROPOFOL N/A 05/02/2018   Procedure: ESOPHAGOGASTRODUODENOSCOPY (EGD) WITH PROPOFOL;  Surgeon: Daneil Dolin, MD;  [facility];  Service: Endoscopy;  Laterality: N/A;  8:30am   EYE SURGERY Bilateral    cataract   HEMOSTASIS CLIP PLACEMENT  03/03/2019   Procedure: HEMOSTASIS CLIP PLACEMENT;  Surgeon: Daneil Dolin, MD;  [facility];  Service: Endoscopy;;   INTERCOSTAL NERVE BLOCK Right 06/19/2019   Procedure: Intercostal Nerve Block;  Surgeon: [name], MD;  Location: [facility];  Service: Thoracic;  Laterality: Right;   IR US GUIDE VASC ACCESS RIGHT  03/13/2018   LOBECTOMY     LYMPH NODE DISSECTION Right 06/19/2019   Procedure: Lymph Node Dissection;  Surgeon: [name], MD;  Location: [facility];  Service: Thoracic;  Laterality: Right;   POLYPECTOMY  03/03/2019   Procedure: POLYPECTOMY;  Surgeon: Daneil Dolin, MD;  [facility];  Service: Endoscopy;;   PORTACATH PLACEMENT Left 09/08/2019   Procedure: INSERTION PORT-A-CATH LEFT CHEST (attached catheter in left subclavian);  Surgeon: Aviva Signs, MD;  [facility];  Service: General;  Laterality: Left;   TONSILLECTOMY     TOOTH EXTRACTION  01/31/2018   x 7   VIDEO BRONCHOSCOPY WITH INSERTION OF INTERBRONCHIAL VALVE (IBV) N/A 06/26/2019   Procedure: VIDEO BRONCHOSCOPY WITH INSERTION OF TWO INTERBRONCHIAL VALVE (IBV);  Surgeon: [name], MD;  Location: [facility];  Service: Thoracic;  Laterality: N/A;   VIDEO BRONCHOSCOPY WITH INSERTION OF INTERBRONCHIAL VALVE (IBV) N/A 08/08/2019   Procedure: VIDEO BRONCHOSCOPY WITH REMOVAL OF INTERBRONCHIAL VALVE (IBV);  Surgeon: [name], MD;  Location: [facility];  Service: Thoracic;  Laterality: N/A;    SOCIAL  HISTORY:  Social History   Socioeconomic History   Marital status: Single    Spouse name: Not on file   Number of children: Not on file   Years of education: Not on file   Highest education level: Not on file  Occupational History   Not on file  Tobacco Use   Smoking status: Former    Packs/day: 0.50    Years: 38.00    Pack years: 19.00    Types: Cigarettes    Quit date: 06/19/2019    Years since quitting: 1.4   Smokeless tobacco: Never  Vaping Use   Vaping Use: Never used  Substance and Sexual Activity   Alcohol use: Not Currently    Comment: Last use of alcohol 03/2016   Drug use: Not Currently    Types: 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   Sexual activity: Not on file  Other Topics Concern   Not on file  Social History Narrative   Not on file   Social Determinants of  Health   Financial Resource Strain: Medium Risk   Difficulty of Paying Living Expenses: Somewhat hard  Food Insecurity: Food Insecurity Present   Worried About Running Out of Food in the Last Year: Often true   Ran Out of Food in the Last Year: Often true  Transportation Needs: No Transportation Needs   Lack of Transportation (Medical): No   Lack of Transportation (Non-Medical): No  Physical Activity: Sufficiently Active   Days of Exercise per Week: 3 days   Minutes of Exercise per Session: 150+ min  Stress: Stress Concern Present   Feeling of Stress : Very much  Social Connections: Socially Isolated   Frequency of Communication with Friends and Family: More than three times a week   Frequency of Social Gatherings with Friends and Family: Once a week   Attends Religious Services: Never   Marine scientist or Organizations: No   Attends Music therapist: Never   Marital Status: Divorced  Human resources officer Violence: Not At Risk   Fear of Current or Ex-Partner: No   Emotionally Abused: No   Physically Abused: No   Sexually Abused: No    FAMILY HISTORY:  Family History  Problem  Relation Age of Onset   Congenital heart disease Mother    Bipolar disorder Mother    Prostate cancer Brother    Alzheimer's disease Paternal Grandmother    Colon cancer Neg Hx     CURRENT MEDICATIONS:  Current Outpatient Medications  Medication Sig Dispense Refill   albuterol (VENTOLIN HFA) 108 (90 Base) MCG/ACT inhaler Inhale 2 puffs into the lungs every 4 (four) hours as needed for wheezing or shortness of breath.  (Patient not taking: Reported on 08/26/2020)     dexamethasone (DECADRON) 6 MG tablet TAKE 1 TABLET BY MOUTH EVERY DAY FOR 10 DAYS     diphenhydrAMINE (BENADRYL) 25 MG tablet Take 25 mg by mouth at bedtime.      divalproex (DEPAKOTE) 250 MG DR tablet Take 250 mg by mouth at bedtime.      folic acid (FOLVITE) 1 MG tablet Take 1 mg by mouth daily.     lidocaine-prilocaine (EMLA) cream Apply a small amount to port a cath site and cover with plastic wrap 1 hour prior to chemotherapy appointments 30 g 3   ondansetron (ZOFRAN ODT) 8 MG disintegrating tablet Place 1 tablet under tongue every 8 hours as needed for nausea. You may begin using this medication on the third day after each chemotherapy treatment. (Patient not taking: Reported on 08/26/2020) 20 tablet 1   pantoprazole (PROTONIX) 40 MG tablet TAKE 1 TABLET BY MOUTH DAILY BEFORE BREAKFAST 90 tablet 3   risperiDONE (RISPERDAL) 2 MG tablet Take 2 mg by mouth at bedtime.      scopolamine (TRANSDERM-SCOP) 1 MG/3DAYS Place 1 patch (1.5 mg total) onto the skin every 3 (three) days. 10 patch 12   SYMBICORT 160-4.5 MCG/ACT inhaler Inhale 2 puffs into the lungs 2 (two) times daily. 1 each 6   traMADol (ULTRAM) 50 MG tablet Take 1 tablet (50 mg total) by mouth daily as needed. (Patient not taking: Reported on 08/26/2020) 30 tablet 0   No current facility-administered medications for this visit.    ALLERGIES:  Allergies  Allergen Reactions   Folic Acid Anaphylaxis    Not entirely clear if it is from folic acid. She is taking folic  acid now and doesn't have the throat swelling anymore   Penicillins Anaphylaxis    Throat swelled Did it  involve swelling of the face/tongue/throat, SOB, or low BP? Yes Did it involve sudden or severe rash/hives, skin peeling, or any reaction on the inside of your mouth or nose? No Did you need to seek medical attention at a hospital or doctor's office? No When did it last happen?      childhood allergy If all above answers are "NO", may proceed with cephalosporin use.    Amoxicillin     hallucinations Did it involve swelling of the face/tongue/throat, SOB, or low BP? No Did it involve sudden or severe rash/hives, skin peeling, or any reaction on the inside of your mouth or nose? No Did you need to seek medical attention at a hospital or doctor's office? Yes When did it last happen?      July 2019 If all above answers are "NO", may proceed with cephalosporin use.    Fish Allergy Nausea Only   Other Swelling    Patient states that the sunrise brand folic acid made her feel like her throat was swelling.     PHYSICAL EXAM:  Performance status (ECOG): 1 - Symptomatic but completely ambulatory  There were no vitals filed for this visit. Wt Readings from Last 3 Encounters:  08/26/20 159 lb 8 oz (72.3 kg)  04/19/20 166 lb (75.3 kg)  12/10/19 160 lb 3.2 oz (72.7 kg)   Physical Exam Vitals reviewed.  Constitutional:      Appearance: Normal appearance.  Cardiovascular:     Rate and Rhythm: Normal rate and regular rhythm.     Pulses: Normal pulses.     Heart sounds: Normal heart sounds.  Pulmonary:     Effort: Pulmonary effort is normal.     Breath sounds: Normal breath sounds.  Neurological:     General: No focal deficit present.     Mental Status: She is alert and oriented to person, place, and time.  Psychiatric:        Mood and Affect: Mood normal.        Behavior: Behavior normal.     LABORATORY DATA:  I have reviewed the labs as listed.  CBC Latest Ref Rng & Units  11/30/2020 08/17/2020 04/14/2020  WBC 4.0 - 10.5 K/uL 13.2(H) 9.2 6.0  Hemoglobin 12.0 - 15.0 g/dL 13.4 14.6 13.5  Hematocrit 36.0 - 46.0 % 38.0 43.9 42.2  Platelets 150 - 400 K/uL 174 183 190   CMP Latest Ref Rng & Units 11/30/2020 08/17/2020 04/14/2020  Glucose 70 - 99 mg/dL 117(H) 103(H) 135(H)  BUN 6 - 20 mg/dL 7 6 7   Creatinine 0.44 - 1.00 mg/dL 0.46 0.57 0.47  Sodium 135 - 145 mmol/L 130(L) 134(L) 138  Potassium 3.5 - 5.1 mmol/L 3.1(L) 3.9 4.0  Chloride 98 - 111 mmol/L 95(L) 98 103  CO2 22 - 32 mmol/L 25 28 26   Calcium 8.9 - 10.3 mg/dL 8.4(L) 9.1 9.1  Total Protein 6.5 - 8.1 g/dL 7.3 7.6 7.3  Total Bilirubin 0.3 - 1.2 mg/dL 0.8 0.6 0.4  Alkaline Phos 38 - 126 U/L 55 50 63  AST 15 - 41 U/L 24 16 20   ALT 0 - 44 U/L 17 11 12     DIAGNOSTIC IMAGING:  I have independently reviewed the scans and discussed with the patient. CT Chest W Contrast  Result Date: 12/01/2020 CLINICAL DATA:  Stage III non-small cell lung cancer status post right lower lobectomy on 06/19/2019 EXAM: CT CHEST WITH CONTRAST TECHNIQUE: Multidetector CT imaging of the chest was performed during intravenous contrast administration. CONTRAST:  80m OMNIPAQUE IOHEXOL 300 MG/ML  SOLN COMPARISON:  08/17/2020, 04/14/2020 FINDINGS: Cardiovascular: Moderate multi-vessel coronary artery calcification. Global cardiac size within normal limits. No pericardial effusion. Central pulmonary arteries are of normal caliber. Mild atherosclerotic calcification is seen within the thoracic aorta. No aortic aneurysm. Left subclavian chest port is in place with its tip within the superior cavoatrial junction. Mediastinum/Nodes: 8 mm left thyroid nodule is unchanged. Thyroid gland is otherwise unremarkable. Borderline pathologic mediastinal adenopathy is stable. Index right paratracheal lymph node measures 13 mm at axial image # 51/2. Subcarinal lymph node measures 16 mm at axial image # 59/2. No new pathologic thoracic adenopathy. The esophagus is  unremarkable. Lungs/Pleura: Surgical changes of right lower lobectomy are again identified with associated right-sided volume loss. Superimposed mild centrilobular and paraseptal emphysema. 10 mm noncalcified subpleural right middle lobe pulmonary nodule is stable since multiple prior examinations dating as far back as 12/30/2019. 5 mm noncalcified pulmonary nodule within the right lower lobe, axial image # 85/4, is stable since 04/14/2020. Since the prior examination, there has developed peribronchial and ground-glass centrilobular asymmetric nodularity, most severe within the right upper lobe and right middle lobe, likely infectious or inflammatory in the acute setting. The central airways are widely patent. No pneumothorax or pleural effusion. Upper Abdomen: No acute abnormality. Musculoskeletal: No acute bone abnormality. No lytic or blastic bone lesion. IMPRESSION: Status post right lower lobectomy. No evidence of recurrent or residual disease within the thorax. Stable borderline mediastinal adenopathy. Interval development of asymmetric bilateral peribronchial and centrilobular nodularity most in keeping with atypical infection in the acute setting. Correlation for COVID-19 pneumonia may be helpful for further management. Stable right middle lobe pulmonary nodules, as described above. Moderate coronary artery calcification. Mild emphysema. 8 mm left thyroid nodule. Not clinically significant; no follow-up imaging recommended (ref: J Am Coll Radiol. 2015 Feb;12(2): 143-50). Aortic Atherosclerosis (ICD10-I70.0) and Emphysema (ICD10-J43.9). Electronically Signed   By: AFidela SalisburyM.D.   On: 12/01/2020 02:19     ASSESSMENT:  1.  Stage IIIa (PT1CPN2) right lung adenocarcinoma: -PET scan on 05/13/2019 showed right lower lobe nodule concerning for bronchogenic carcinoma.  Cirrhotic liver. -Right lower lobectomy and lymph node excision on 06/19/2019. -Pathology showed 1.7 cm right lower lobe adenocarcinoma,  unifocal, lymphovascular invasion present.  Margins negative.  2/14 lymph nodes positive (level 7 and 10R).  PT1CPN2. -PD-L1 30%, K-ras G12A, MSI stable.  EGFR mutation not identified. -4 cycles of adjuvant carboplatin and pemetrexed from 09/09/2019 through 11/11/2019. -CT chest on 12/03/2019 shows small volume partially loculated right-sided pleural fluid inferiorly and medially.  Minimal increased density within the pleural fluid related to complexity.  Right paratracheal lymph node which is not pathologic by size criteria.  Similar left lower lobe 5 mm pulmonary nodule nonspecific.  Cirrhosis and portal venous hypertension.   PLAN:  1.  Stage III right lung adenocarcinoma: - We have reviewed CT chest with contrast from 11/30/2020 which did not show any evidence of recurrence.  However there is interval development of asymmetric bilateral peribronchial nodularity. - She complains of coughing up greenish sputum. - We will send her Levaquin 500 milligrams daily for 10 days. - Reviewed labs from 11/30/2020 which were grossly within normal limits. - RTC 6 months for follow-up with repeat scan.   2.  Immune mediated thrombocytopenia: - She was treated for hep C infection in the past and also has cirrhosis. - Platelet count normalized to 174.   3.  Diffuse body pains and back pain: - Continue tramadol  as needed.   Orders placed this encounter:  No orders of the defined types were placed in this encounter.    Derek Jack, MD [place] [phone]   I, Thana Ates, am acting as a scribe for Dr. Derek Jack.  I, Derek Jack MD, have reviewed the above documentation for accuracy and completeness, and I agree with the above.

## 2020-12-07 ENCOUNTER — Inpatient Hospital Stay (HOSPITAL_BASED_OUTPATIENT_CLINIC_OR_DEPARTMENT_OTHER): Payer: Medicaid Other | Admitting: Hematology

## 2020-12-07 ENCOUNTER — Other Ambulatory Visit: Payer: Self-pay

## 2020-12-07 VITALS — BP 117/59 | HR 86 | Temp 98.4°F | Resp 18 | Wt 161.2 lb

## 2020-12-07 DIAGNOSIS — D693 Immune thrombocytopenic purpura: Secondary | ICD-10-CM | POA: Insufficient documentation

## 2020-12-07 DIAGNOSIS — C3491 Malignant neoplasm of unspecified part of right bronchus or lung: Secondary | ICD-10-CM | POA: Diagnosis not present

## 2020-12-07 DIAGNOSIS — Z85118 Personal history of other malignant neoplasm of bronchus and lung: Secondary | ICD-10-CM | POA: Diagnosis present

## 2020-12-07 DIAGNOSIS — J449 Chronic obstructive pulmonary disease, unspecified: Secondary | ICD-10-CM | POA: Diagnosis not present

## 2020-12-07 MED ORDER — LEVOFLOXACIN 500 MG PO TABS: 500.0000 mg | ORAL_TABLET | Freq: Every day | ORAL | 0 refills | Status: DC

## 2020-12-07 NOTE — Patient Instructions (Signed)
[place] at [facility] Discharge Instructions  You were seen and examined today by Dr. Delton Coombes. He reviewed your most recent labs and everything looks good except your white blood cells were elevated. This shows that you have infection going on. He also reviewed your most recent scan and it shows that you have infection going on. Please follow up as scheduled.   Thank you for choosing [place] at [facility] to provide your oncology and hematology care.  To afford each patient quality time with our provider, please arrive at least 15 minutes before your scheduled appointment time.   If you have a lab appointment with the [facility] please come in thru the Main Entrance and check in at the main information desk.  You need to re-schedule your appointment should you arrive 10 or more minutes late.  We strive to give you quality time with our providers, and arriving late affects you and other patients whose appointments are after yours.  Also, if you no show three or more times for appointments you may be dismissed from the clinic at the providers discretion.     Again, thank you for choosing [facility].  Our hope is that these requests will decrease the amount of time that you wait before being seen by our physicians.       _____________________________________________________________  Should you have questions after your visit to [facility], please contact our office at [phone] and follow the prompts.  Our office hours are 8:00 a.m. and 4:30 p.m. Monday - Friday.  Please note that voicemails left after 4:00 p.m. may not be returned until the following business day.  We are closed weekends and major holidays.  You do have access to a nurse 24-7, just call the main number to the clinic [phone] and do not press any options, hold on the line and a nurse will answer the phone.    For prescription refill  requests, have your pharmacy contact our office and allow 72 hours.    Due to Covid, you will need to wear a mask upon entering the hospital. If you do not have a mask, a mask will be given to you at the Main Entrance upon arrival. For doctor visits, patients may have 1 support person age 62 or older with them. For treatment visits, patients can not have anyone with them due to social distancing guidelines and our immunocompromised population.

## 2021-02-25 ENCOUNTER — Encounter (HOSPITAL_COMMUNITY): Payer: Self-pay | Admitting: Hematology

## 2021-02-25 ENCOUNTER — Emergency Department (HOSPITAL_COMMUNITY): Payer: Medicaid Other

## 2021-02-25 ENCOUNTER — Encounter (HOSPITAL_COMMUNITY): Payer: Self-pay | Admitting: *Deleted

## 2021-02-25 ENCOUNTER — Emergency Department (HOSPITAL_COMMUNITY)
Admission: EM | Admit: 2021-02-25 | Discharge: 2021-02-25 | Disposition: A | Discharge status: Home / Self Care | Payer: Medicaid Other | Attending: Student | Admitting: Student

## 2021-02-25 DIAGNOSIS — Z87891 Personal history of nicotine dependence: Secondary | ICD-10-CM | POA: Insufficient documentation

## 2021-02-25 DIAGNOSIS — I10 Essential (primary) hypertension: Secondary | ICD-10-CM | POA: Insufficient documentation

## 2021-02-25 DIAGNOSIS — G311 Senile degeneration of brain, not elsewhere classified: Secondary | ICD-10-CM | POA: Insufficient documentation

## 2021-02-25 DIAGNOSIS — Z7951 Long term (current) use of inhaled steroids: Secondary | ICD-10-CM | POA: Insufficient documentation

## 2021-02-25 DIAGNOSIS — Z85118 Personal history of other malignant neoplasm of bronchus and lung: Secondary | ICD-10-CM | POA: Insufficient documentation

## 2021-02-25 DIAGNOSIS — Z20822 Contact with and (suspected) exposure to covid-19: Secondary | ICD-10-CM | POA: Insufficient documentation

## 2021-02-25 DIAGNOSIS — R0602 Shortness of breath: Secondary | ICD-10-CM | POA: Diagnosis present

## 2021-02-25 DIAGNOSIS — Z79899 Other long term (current) drug therapy: Secondary | ICD-10-CM | POA: Diagnosis not present

## 2021-02-25 DIAGNOSIS — J441 Chronic obstructive pulmonary disease with (acute) exacerbation: Secondary | ICD-10-CM | POA: Diagnosis not present

## 2021-02-25 LAB — RESP PANEL BY RT-PCR (FLU A&B, COVID) ARPGX2
Influenza A by PCR: NEGATIVE
Influenza B by PCR: NEGATIVE
SARS Coronavirus 2 by RT PCR: NEGATIVE

## 2021-02-25 LAB — BLOOD GAS, VENOUS
Acid-Base Excess: 5 mmol/L — ABNORMAL HIGH (ref 0.0–2.0)
Bicarbonate: 25.8 mmol/L (ref 20.0–28.0)
Drawn by: 27160
FIO2: 21
O2 Saturation: 31.4 %
Patient temperature: 36.5
pCO2, Ven: 54.7 mmHg (ref 44.0–60.0)
pH, Ven: 7.359 (ref 7.250–7.430)
pO2, Ven: <31 mmHg — CL (ref 32.0–45.0)

## 2021-02-25 LAB — CBC
HCT: 47.7 % — ABNORMAL HIGH (ref 36.0–46.0)
Hemoglobin: 15.6 g/dL — ABNORMAL HIGH (ref 12.0–15.0)
MCH: 32.1 pg (ref 26.0–34.0)
MCHC: 32.7 g/dL (ref 30.0–36.0)
MCV: 98.1 fL (ref 80.0–100.0)
Platelets: 162 10*3/uL (ref 150–400)
RBC: 4.86 MIL/uL (ref 3.87–5.11)
RDW: 14.1 % (ref 11.5–15.5)
WBC: 10.8 10*3/uL — ABNORMAL HIGH (ref 4.0–10.5)
nRBC: 0 % (ref 0.0–0.2)

## 2021-02-25 LAB — BASIC METABOLIC PANEL
Anion gap: 10 (ref 5–15)
BUN: 8 mg/dL (ref 6–20)
CO2: 27 mmol/L (ref 22–32)
Calcium: 9.2 mg/dL (ref 8.9–10.3)
Chloride: 98 mmol/L (ref 98–111)
Creatinine, Ser: 0.62 mg/dL (ref 0.44–1.00)
GFR, Estimated: >60 mL/min (ref >60)
Glucose, Bld: 108 mg/dL — ABNORMAL HIGH (ref 70–99)
Potassium: 4.1 mmol/L (ref 3.5–5.1)
Sodium: 135 mmol/L (ref 135–145)

## 2021-02-25 LAB — TROPONIN I (HIGH SENSITIVITY)
Troponin I (High Sensitivity): <2 ng/L (ref <18)
Troponin I (High Sensitivity): <2 ng/L (ref <18)

## 2021-02-25 MED ORDER — IOHEXOL 350 MG/ML SOLN
75.0000 mL | Freq: Once | INTRAVENOUS | Status: AC | PRN
  Administered 2021-02-25: 75 mL via INTRAVENOUS

## 2021-02-25 MED ORDER — PREDNISONE 10 MG PO TABS: 40.0000 mg | ORAL_TABLET | Freq: Every day | ORAL | 0 refills | Status: AC

## 2021-02-25 MED ORDER — METHYLPREDNISOLONE SODIUM SUCC 125 MG IJ SOLR
125.0000 mg | Freq: Once | INTRAMUSCULAR | Status: AC
  Administered 2021-02-25: 125 mg via INTRAVENOUS
  Filled 2021-02-25: qty 2

## 2021-02-25 MED ORDER — IPRATROPIUM-ALBUTEROL 0.5-2.5 (3) MG/3ML IN SOLN
9.0000 mL | Freq: Once | RESPIRATORY_TRACT | Status: AC
  Administered 2021-02-25: 9 mL via RESPIRATORY_TRACT
  Filled 2021-02-25: qty 9

## 2021-02-25 NOTE — ED Triage Notes (Signed)
State my heart is hurting x 3 days, states she has a sharpe pain every time she takes a breath

## 2021-02-25 NOTE — ED Notes (Signed)
Pt left prior to getting discharge instructions. Called patient, patient states she took her own IV access to he port out, states "I had to go, so I took it out myself." Informed pt we were waiting on discharge papers. Pt refused to return to the ED for discharge instructions or to allow nursing staff to check port access.

## 2021-02-25 NOTE — ED Notes (Signed)
Two port access kits used on pt but one discarded d/t break in sterile technique. Charge nurse saw port needle in sharps box and thought she did remove it, but only one access kit  noted  in room. EDP made aware

## 2021-02-25 NOTE — ED Provider Notes (Signed)
[facility] EMERGENCY DEPARTMENT Provider Note  CSN: [id number] Arrival date & time: 02/25/21 1330  Chief Complaint(s) Chest Pain  HPI Gurbani Figge is a 56 y.o. female with PMH alcoholic hepatitis, bipolar disorder, right lung cancer status post lobectomy, COPD who presents emergency department for evaluation of chest pain and shortness of breath.  Patient states that she has had 3 days of pain that started after all of mechanical fall.  She states that she fell backwards and struck her head on the ground 3 days ago and she has had persistent chest pain shortness of breath since then.  She denies abdominal pain, nausea, vomiting, fever or other systemic symptoms.   Chest Pain Associated symptoms: shortness of breath    Past Medical History Past Medical History:  Diagnosis Date   Alcoholic hepatitis without ascites    Anxiety    Arthritis    knees, hands   Atypical squamous cell of undetermined significance of cervix    Bipolar disorder (HCC)    Cancer (HCC)    right lung   COPD (chronic obstructive pulmonary disease) (HCC)    Depression    Dyspnea    Emphysema lung (HCC)    GERD (gastroesophageal reflux disease)    Hepatitis C    s/p treatment with Epclusa   History of HPV infection    History of pneumonia    Pneumonia    Smoker    Patient Active Problem List   Diagnosis Date Noted   Adenocarcinoma of lung, stage 3, right ([place]) 07/02/2019   S/P lobectomy of lung 06/19/2019   Nodule of lower lobe of right lung 05/15/2019   Positive colorectal cancer screening using Cologuard test 12/05/2018   Hepatic cirrhosis ([place]) 11/16/2017   Thrombocytopenia ([place]) 09/10/2017   Hepatitis C antibody test positive 09/10/2017   Abnormal abdominal ultrasound 09/10/2017   Hypernatremia    Essential hypertension    Acute encephalopathy 08/06/2017   Hyperammonemia ([place]) 08/06/2017   Dehydration 08/06/2017   COPD (chronic obstructive pulmonary disease) ([place]) 08/06/2017    Transaminitis 08/06/2017   Idiopathic thrombocytopenic purpura (ITP) ([place]) 06/13/2017   Home Medication(s) Prior to Admission medications   Medication Sig Start Date End Date Taking? Authorizing Provider  predniSONE (DELTASONE) 10 MG tablet Take 4 tablets (40 mg total) by mouth daily for 4 days. 02/25/21 03/01/21 Yes Marija Calamari, MD  albuterol (VENTOLIN HFA) 108 (90 Base) MCG/ACT inhaler Inhale 2 puffs into the lungs every 4 (four) hours as needed for wheezing or shortness of breath. 01/17/19   [provider]  dexamethasone (DECADRON) 6 MG tablet TAKE 1 TABLET BY MOUTH EVERY DAY FOR 10 DAYS 02/17/20   [provider]  diphenhydrAMINE (BENADRYL) 25 MG tablet Take 25 mg by mouth at bedtime.     [provider]  divalproex (DEPAKOTE) 250 MG DR tablet Take 250 mg by mouth at bedtime.     [provider]  folic acid (FOLVITE) 1 MG tablet Take 1 mg by mouth daily. 10/30/19   [provider]  levofloxacin (LEVAQUIN) 500 MG tablet Take 1 tablet (500 mg total) by mouth daily. 12/07/20   Derek Jack, MD  lidocaine-prilocaine (EMLA) cream Apply a small amount to port a cath site and cover with plastic wrap 1 hour prior to chemotherapy appointments Patient not taking: Reported on 12/07/2020 08/27/19   Derek Jack, MD  ondansetron (ZOFRAN ODT) 8 MG disintegrating tablet Place 1 tablet under tongue every 8 hours as needed for nausea. You may begin using this  medication on the third day after each chemotherapy treatment. 08/27/19   Derek Jack, MD  pantoprazole (PROTONIX) 40 MG tablet TAKE 1 TABLET BY MOUTH DAILY BEFORE BREAKFAST 12/17/19   Annitta Needs, NP  risperiDONE (RISPERDAL) 2 MG tablet Take 2 mg by mouth at bedtime.     [provider]  scopolamine (TRANSDERM-SCOP) 1 MG/3DAYS Place 1 patch (1.5 mg total) onto the skin every 3 (three) days. 08/27/19   Derek Jack, MD  SYMBICORT 160-4.5 MCG/ACT inhaler Inhale 2 puffs  into the lungs 2 (two) times daily. 08/26/20   Verlon Au, NP  traMADol (ULTRAM) 50 MG tablet Take 1 tablet (50 mg total) by mouth daily as needed. 10/21/19   Derek Jack, MD                                                                                                                                    Past Surgical History Past Surgical History:  Procedure Laterality Date   BIOPSY  05/02/2018   Procedure: BIOPSY;  Surgeon: Daneil Dolin, MD;  [facility];  Service: Endoscopy;;  gastric   CESAREAN SECTION     CHEST TUBE INSERTION Right 06/26/2019   Procedure: Right CHEST TUBE REPLACEMENT;  Surgeon: [name], MD;  Location: [place];  Service: Thoracic;  Laterality: Right;   COLONOSCOPY WITH PROPOFOL N/A 03/03/2019   Procedure: COLONOSCOPY WITH PROPOFOL;  Surgeon: Daneil Dolin, MD;  [facility];  Service: Endoscopy;  Laterality: N/A;  9:15am   ESOPHAGOGASTRODUODENOSCOPY (EGD) WITH PROPOFOL N/A 05/02/2018   Procedure: ESOPHAGOGASTRODUODENOSCOPY (EGD) WITH PROPOFOL;  Surgeon: Daneil Dolin, MD;  [facility];  Service: Endoscopy;  Laterality: N/A;  8:30am   EYE SURGERY Bilateral    cataract   HEMOSTASIS CLIP PLACEMENT  03/03/2019   Procedure: HEMOSTASIS CLIP PLACEMENT;  Surgeon: Daneil Dolin, MD;  [facility];  Service: Endoscopy;;   INTERCOSTAL NERVE BLOCK Right 06/19/2019   Procedure: Intercostal Nerve Block;  Surgeon: [name], MD;  Location: [place];  Service: Thoracic;  Laterality: Right;   IR US GUIDE VASC ACCESS RIGHT  03/13/2018   LOBECTOMY     LYMPH NODE DISSECTION Right 06/19/2019   Procedure: Lymph Node Dissection;  Surgeon: [name], MD;  Location: [place];  Service: Thoracic;  Laterality: Right;   POLYPECTOMY  03/03/2019   Procedure: POLYPECTOMY;  Surgeon: Daneil Dolin, MD;  [facility];  Service: Endoscopy;;   PORTACATH PLACEMENT Left 09/08/2019   Procedure: INSERTION  PORT-A-CATH LEFT CHEST (attached catheter in left subclavian);  Surgeon: Aviva Signs, MD;  Location: [facility];  Service: General;  Laterality: Left;   TONSILLECTOMY     TOOTH EXTRACTION  01/31/2018   x 7   VIDEO BRONCHOSCOPY WITH INSERTION OF INTERBRONCHIAL VALVE (IBV) N/A 06/26/2019   Procedure: VIDEO BRONCHOSCOPY WITH INSERTION OF TWO INTERBRONCHIAL VALVE (IBV);  Surgeon: [name], MD;  Location: [facility];  Service: Thoracic;  Laterality: N/A;   VIDEO BRONCHOSCOPY WITH INSERTION OF INTERBRONCHIAL VALVE (IBV) N/A 08/08/2019   Procedure: VIDEO BRONCHOSCOPY WITH REMOVAL OF INTERBRONCHIAL VALVE (IBV);  Surgeon: [name], MD;  Location: [facility] OR;  Service: Thoracic;  Laterality: N/A;   Family History Family History  Problem Relation Age of Onset   Congenital heart disease Mother    Bipolar disorder Mother    Prostate cancer Brother    Alzheimer's disease Paternal Grandmother    Colon cancer Neg Hx     Social History Social History   Tobacco Use   Smoking status: Former    Packs/day: 0.50    Years: 38.00    Pack years: 19.00    Types: Cigarettes    Quit date: 06/19/2019    Years since quitting: 1.6   Smokeless tobacco: Never  Vaping Use   Vaping Use: Never used  Substance Use Topics   Alcohol use: Not Currently    Comment: Last use of alcohol 03/2016   Drug use: Not Currently    Types: Heroin    Comment: in 01U   Allergies Folic acid, Penicillins, Amoxicillin, Fish allergy, and Other  Review of Systems Review of Systems  Respiratory:  Positive for shortness of breath.   Cardiovascular:  Positive for chest pain.   Physical Exam Vital Signs  I have reviewed the triage vital signs BP (!) 120/91    Pulse 92    Temp (!) 97 F (36.1 C)    Resp 18    Ht 5\' 1"  (1.549 m)    Wt 73 kg    SpO2 100%    BMI 30.42 kg/m   Physical Exam Vitals and nursing note reviewed.  Constitutional:      General: She is not in acute distress.    Appearance: She is  well-developed.  HENT:     Head: Normocephalic and atraumatic.  Eyes:     Conjunctiva/sclera: Conjunctivae normal.  Cardiovascular:     Rate and Rhythm: Normal rate and regular rhythm.     Heart sounds: No murmur heard. Pulmonary:     Effort: Pulmonary effort is normal. No respiratory distress.     Breath sounds: Wheezing present.  Abdominal:     Palpations: Abdomen is soft.     Tenderness: There is no abdominal tenderness.  Musculoskeletal:        General: No swelling.     Cervical back: Neck supple.  Skin:    General: Skin is warm and dry.     Capillary Refill: Capillary refill takes less than 2 seconds.  Neurological:     Mental Status: She is alert.  Psychiatric:        Mood and Affect: Mood normal.    ED Results and Treatments Labs (all labs ordered are listed, but only abnormal results are displayed) Labs Reviewed  BASIC METABOLIC PANEL - Abnormal; Notable for the following components:      Result Value   Glucose, Bld 108 (*)    All other components within normal limits  CBC - Abnormal; Notable for the following components:   WBC 10.8 (*)    Hemoglobin 15.6 (*)    HCT 47.7 (*)    All other components within normal limits  BLOOD GAS, VENOUS - Abnormal; Notable for the following components:   pO2, Ven <31.0 (*)    Acid-Base Excess 5.0 (*)    All other components within normal limits  RESP PANEL BY RT-PCR (FLU A&B, COVID) ARPGX2  POC URINE PREG, ED  TROPONIN I (HIGH SENSITIVITY)  TROPONIN I (HIGH SENSITIVITY)                                                                                                                          Radiology DG Chest 2 View  Result Date: 02/25/2021 CLINICAL DATA:  Chest pain. EXAM: CHEST - 2 VIEW COMPARISON:  AP chest 09/08/2019, CT chest 11/30/2020 FINDINGS: Left chest wall porta catheter is again seen with tip overlying the superior vena cava/right atrial junction. Cardiac silhouette and mediastinal contours are within normal  limits. There is again right lung volume loss. Right lower lung linear scarring. Inferolateral right lung nodular density is grossly similar to prior 11/30/2020 CT. The left lung is clear. No pleural effusion or pneumothorax. Mild multilevel degenerative disc changes of the thoracic spine. IMPRESSION: Postsurgical changes of right lower lobectomy. Right basilar scarring and grossly stable pulmonary nodule seen on prior CT. No acute lung process. Electronically Signed   By: Yvonne Kendall M.D.   On: 02/25/2021 14:26   CT Head Wo Contrast  Result Date: 02/25/2021 CLINICAL DATA:  Trauma EXAM: CT HEAD WITHOUT CONTRAST TECHNIQUE: Contiguous axial images were obtained from the base of the skull through the vertex without intravenous contrast. RADIATION DOSE REDUCTION: This exam was performed according to the departmental dose-optimization program which includes automated exposure control, adjustment of the mA and/or kV according to patient size and/or use of iterative reconstruction technique. COMPARISON:  08/06/2017 FINDINGS: Brain: No acute intracranial findings are seen. There is no focal mass effect. There is prominence of cortical sulci. Vascular: Unremarkable. Skull: Unremarkable. Sinuses/Orbits: There is mild mucosal thickening in the ethmoid sinus. Other: There is increased amount of CSF insula suggesting partial empty sella. IMPRESSION: No acute intracranial findings are seen in noncontrast CT brain. Cortical sulci are prominent suggesting atrophy. Electronically Signed   By: Elmer Picker M.D.   On: 02/25/2021 18:23   CT Angio Chest PE W and/or Wo Contrast  Result Date: 02/25/2021 CLINICAL DATA:  Sharp chest pain with inspiration for 3 days, history of stage III non-small cell lung cancer with right lower lobectomy 06/19/2019 EXAM: CT ANGIOGRAPHY CHEST WITH CONTRAST TECHNIQUE: Multidetector CT imaging of the chest was performed using the standard protocol during bolus administration of intravenous  contrast. Multiplanar CT image reconstructions and MIPs were obtained to evaluate the vascular anatomy. RADIATION DOSE REDUCTION: This exam was performed according to the departmental dose-optimization program which includes automated exposure control, adjustment of the mA and/or kV according to patient size and/or use of iterative reconstruction technique. CONTRAST:  66mL OMNIPAQUE IOHEXOL 350 MG/ML SOLN COMPARISON:  02/25/2021, 11/30/2020 FINDINGS: Cardiovascular: This is a technically adequate evaluation of the pulmonary vasculature. No filling defects or pulmonary emboli. The heart is unremarkable without pericardial effusion. No evidence of thoracic aortic aneurysm or dissection. Moderate atherosclerosis of the aorta and coronary vasculature. Left chest wall port via subclavian  approach, tip within the superior vena cava. Mediastinum/Nodes: Borderline precarinal adenopathy measuring up to 10 mm in short axis, previously having measured 13 mm. Subcarinal adenopathy measuring up to 15 mm in short axis, previously measuring 16 mm. No new adenopathy. Thyroid, trachea, and esophagus are grossly unremarkable. Lungs/Pleura: Postsurgical changes from previous right lower lobectomy. Stable upper lobe predominant emphysematous change. No acute airspace disease, effusion, or pneumothorax. Central airways are patent. The right middle lobe subpleural pulmonary nodule seen previously is again noted, measuring up to 7 mm on this exam reference image 73/6. 4 mm subpleural right middle lobe pulmonary nodule reference image 85/6 is unchanged. Upper Abdomen: Stable nodular contour of the liver consistent with cirrhosis. No acute upper abdominal findings. Musculoskeletal: No acute or destructive bony lesions. Reconstructed images demonstrate no additional findings. Review of the MIP images confirms the above findings. IMPRESSION: 1. No evidence of pulmonary embolus. 2. Slight decrease in mediastinal adenopathy since prior study.  3. Slight decrease in size of right middle lobe pulmonary nodules. 4. Stable postsurgical changes from right lower lobectomy. 5. Nodular liver contour consistent with cirrhosis. 6. Aortic Atherosclerosis (ICD10-I70.0) and Emphysema (ICD10-J43.9). Electronically Signed   By: Randa Ngo M.D.   On: 02/25/2021 18:27    Pertinent labs & imaging results that were available during my care of the patient were reviewed by me and considered in my medical decision making (see MDM for details).  Medications Ordered in ED Medications  ipratropium-albuterol (DUONEB) 0.5-2.5 (3) MG/3ML nebulizer solution 9 mL (9 mLs Nebulization Given 02/25/21 1711)  methylPREDNISolone sodium succinate (SOLU-MEDROL) 125 mg/2 mL injection 125 mg (125 mg Intravenous Given 02/25/21 1651)  iohexol (OMNIPAQUE) 350 MG/ML injection 75 mL (75 mLs Intravenous Contrast Given 02/25/21 1801)                                                                                                                                     Procedures .Critical Care Performed by: Teressa Lower, MD Authorized by: Teressa Lower, MD   Critical care provider statement:    Critical care time (minutes):  30   Critical care was necessary to treat or prevent imminent or life-threatening deterioration of the following conditions:  Respiratory failure   Critical care was time spent personally by me on the following activities:  Development of treatment plan with patient or surrogate, discussions with consultants, evaluation of patient's response to treatment, examination of patient, ordering and review of laboratory studies, ordering and review of radiographic studies, ordering and performing treatments and interventions, pulse oximetry, re-evaluation of patient's condition and review of old charts  (including critical care time)  Medical Decision Making / ED Course   This patient presents to the ED for concern of chest pain and shortness of breath, this  involves an extensive number of treatment options, and is a complaint that carries with it a high risk of complications and morbidity.  The differential diagnosis includes  COPD exacerbation, rib fracture, pneumonia, PE  MDM: Patient seen emergency department for evaluation of chest pain and shortness of breath.  Physical exam reveals expiratory wheezing and tight air sounds.  Laboratory evaluation with a mild leukocytosis to 10.8 but is otherwise unremarkable.  pH 7.359 and no significant hypercarbia.  High-sensitivity troponin unremarkable.  COVID and flu negative.  Chest x-ray unremarkable.  Patient given 3 back-to-back DuoNeb's and methylprednisolone and on reevaluation her symptoms had improved.  On my reevaluation, patient very antsy and demanding to leave the emergency department to take care of her father at home.  The patient's port was accessed for care here in the emergency department and as the patient's symptoms were improved on reevaluation I had return to my desk to type of her discharge paperwork to inform discharge as her CT PE had not resulted.  I had a long conversation with the patient that if her CT PE had critical pathology she would need to return to the emergency department of which she voiced understanding.  Nursing staff was on the way to remove the patient's port when it was discovered that the patient had eloped and self removed her port access here in the emergency department.  I called the patient personally to ensure that she was not having significant chest pain or bleeding from this port access removal.  Patient states that she has none of the symptoms and she was given strict return precautions of which she voiced understanding.  On follow-up of her chart, CT PE was unremarkable.   Additional history obtained:  -External records from outside source obtained and reviewed including: Chart review including previous notes, labs, imaging, consultation notes   Lab Tests: -I  ordered, reviewed, and interpreted labs.   The pertinent results include:   Labs Reviewed  BASIC METABOLIC PANEL - Abnormal; Notable for the following components:      Result Value   Glucose, Bld 108 (*)    All other components within normal limits  CBC - Abnormal; Notable for the following components:   WBC 10.8 (*)    Hemoglobin 15.6 (*)    HCT 47.7 (*)    All other components within normal limits  BLOOD GAS, VENOUS - Abnormal; Notable for the following components:   pO2, Ven <31.0 (*)    Acid-Base Excess 5.0 (*)    All other components within normal limits  RESP PANEL BY RT-PCR (FLU A&B, COVID) ARPGX2  POC URINE PREG, ED  TROPONIN I (HIGH SENSITIVITY)  TROPONIN I (HIGH SENSITIVITY)      EKG   EKG Interpretation  Date/Time:  Friday February 25 2021 13:41:04 EST Ventricular Rate:  89 PR Interval:  140 QRS Duration: 76 QT Interval:  354 QTC Calculation: 430 R Axis:   25 Text Interpretation: Normal sinus rhythm Normal ECG When compared with ECG of 17-Jun-2019 13:53, No significant change was found Confirmed by [place] ([id number]) on 02/25/2021 3:24:56 PM         Imaging Studies ordered: I ordered imaging studies including CXR, CTPE I independently visualized and interpreted imaging. I agree with the radiologist interpretation   Medicines ordered and prescription drug management: Meds ordered this encounter  Medications   ipratropium-albuterol (DUONEB) 0.5-2.5 (3) MG/3ML nebulizer solution 9 mL   methylPREDNISolone sodium succinate (SOLU-MEDROL) 125 mg/2 mL injection 125 mg   iohexol (OMNIPAQUE) 350 MG/ML injection 75 mL   predniSONE (DELTASONE) 10 MG tablet    Sig: Take 4 tablets (40 mg total) by mouth daily for  4 days.    Dispense:  15 tablet    Refill:  0    -I have reviewed the patients home medicines and have made adjustments as needed  Critical interventions Multiple duonebs, multiple re-evals   Cardiac Monitoring: The patient was maintained on a  cardiac monitor.  I personally viewed and interpreted the cardiac monitored which showed an underlying rhythm of: NSR  Social Determinants of Health:  Factors impacting patients care include: Difficult home family situation   Reevaluation: After the interventions noted above, I reevaluated the patient and found that they have :improved  Co morbidities that complicate the patient evaluation  Past Medical History:  Diagnosis Date   Alcoholic hepatitis without ascites    Anxiety    Arthritis    knees, hands   Atypical squamous cell of undetermined significance of cervix    Bipolar disorder ([place])    Cancer ([place])    right lung   COPD (chronic obstructive pulmonary disease) (HCC)    Depression    Dyspnea    Emphysema lung (HCC)    GERD (gastroesophageal reflux disease)    Hepatitis C    s/p treatment with Epclusa   History of HPV infection    History of pneumonia    Pneumonia    Smoker       Dispostion: I considered admission for this patient, but the patient had eloped from the emergency department prior to an extended discussion on admission versus discharge.     Final Clinical Impression(s) / ED Diagnoses Final diagnoses:  COPD exacerbation [facility])     @PCDICTATION @    Teressa Lower, MD 02/25/21 2111

## 2021-02-25 NOTE — ED Notes (Signed)
Power port needle found hanging out of sharps container. EDP made aware.

## 2021-02-25 NOTE — ED Notes (Signed)
EDP made aware of patient leaving prior to having port flushed and deaccessed.

## 2021-03-09 ENCOUNTER — Inpatient Hospital Stay (HOSPITAL_COMMUNITY): Payer: Medicaid Other | Attending: Hematology

## 2021-03-09 DIAGNOSIS — Z452 Encounter for adjustment and management of vascular access device: Secondary | ICD-10-CM | POA: Insufficient documentation

## 2021-03-09 DIAGNOSIS — D693 Immune thrombocytopenic purpura: Secondary | ICD-10-CM | POA: Insufficient documentation

## 2021-03-09 DIAGNOSIS — Z85118 Personal history of other malignant neoplasm of bronchus and lung: Secondary | ICD-10-CM | POA: Diagnosis present

## 2021-03-09 MED ORDER — SODIUM CHLORIDE 0.9% FLUSH
10.0000 mL | INTRAVENOUS | Status: DC | PRN
  Administered 2021-03-09: 10 mL via INTRAVENOUS

## 2021-03-09 MED ORDER — HEPARIN SOD (PORK) LOCK FLUSH 100 UNIT/ML IV SOLN
500.0000 [IU] | Freq: Once | INTRAVENOUS | Status: AC
  Administered 2021-03-09: 500 [IU] via INTRAVENOUS

## 2021-03-09 NOTE — Patient Instructions (Signed)
[place]  Discharge Instructions: Thank you for choosing [facility] to provide your oncology and hematology care.  If you have a lab appointment with the [facility], please come in thru the Main Entrance and check in at the main information desk.  Wear comfortable clothing and clothing appropriate for easy access to any Portacath or PICC line.   We strive to give you quality time with your provider. You may need to reschedule your appointment if you arrive late (15 or more minutes).  Arriving late affects you and other patients whose appointments are after yours.  Also, if you miss three or more appointments without notifying the office, you may be dismissed from the clinic at the providers discretion.      For prescription refill requests, have your pharmacy contact our office and allow 72 hours for refills to be completed.    Today you received port flush.    BELOW ARE SYMPTOMS THAT SHOULD BE REPORTED IMMEDIATELY: *FEVER GREATER THAN 100.4 F (38 C) OR HIGHER *CHILLS OR SWEATING *NAUSEA AND VOMITING THAT IS NOT CONTROLLED WITH YOUR NAUSEA MEDICATION *UNUSUAL SHORTNESS OF BREATH *UNUSUAL BRUISING OR BLEEDING *URINARY PROBLEMS (pain or burning when urinating, or frequent urination) *BOWEL PROBLEMS (unusual diarrhea, constipation, pain near the anus) TENDERNESS IN MOUTH AND THROAT WITH OR WITHOUT PRESENCE OF ULCERS (sore throat, sores in mouth, or a toothache) UNUSUAL RASH, SWELLING OR PAIN  UNUSUAL VAGINAL DISCHARGE OR ITCHING   Items with * indicate a potential emergency and should be followed up as soon as possible or go to the Emergency Department if any problems should occur.  Please show the CHEMOTHERAPY ALERT CARD or IMMUNOTHERAPY ALERT CARD at check-in to the Emergency Department and triage nurse.  Should you have questions after your visit or need to cancel or reschedule your appointment, please contact [facility] [phone]  and  follow the prompts.  Office hours are 8:00 a.m. to 4:30 p.m. Monday - Friday. Please note that voicemails left after 4:00 p.m. may not be returned until the following business day.  We are closed weekends and major holidays. You have access to a nurse at all times for urgent questions. Please call the main number to the clinic [phone] and follow the prompts.  For any non-urgent questions, you may also contact your provider using MyChart. We now offer e-Visits for anyone 44 and older to request care online for non-urgent symptoms. For details visit [email or web address].   Also download the MyChart app! Go to the app store, search "MyChart", open the app, select [facility], and log in with your MyChart username and password.  Due to Covid, a mask is required upon entering the hospital/clinic. If you do not have a mask, one will be given to you upon arrival. For doctor visits, patients may have 1 support person aged 66 or older with them. For treatment visits, patients cannot have anyone with them due to current Covid guidelines and our immunocompromised population.

## 2021-03-09 NOTE — Progress Notes (Signed)
Rachel Gross presented for Portacath access and flush.  Portacath located left chest wall accessed with  H 20 needle.  Good blood return present. Portacath flushed with 60ml NS and 500U/22ml Heparin and needle removed intact. No bruising or swelling noted at the site.  Procedure tolerated well and without incident.     Discharged from clinic ambulatory in stable condition. Alert and oriented x 3. F/U with [facility] as scheduled.

## 2021-04-03 ENCOUNTER — Other Ambulatory Visit: Payer: Self-pay | Admitting: Gastroenterology

## 2021-04-05 ENCOUNTER — Encounter: Payer: Self-pay | Admitting: Internal Medicine

## 2021-04-05 NOTE — Telephone Encounter (Signed)
Refilled but needs office visit.  ?

## 2021-04-19 ENCOUNTER — Ambulatory Visit: Payer: Medicaid Other | Admitting: Internal Medicine

## 2021-04-26 ENCOUNTER — Other Ambulatory Visit: Payer: Self-pay

## 2021-04-26 ENCOUNTER — Ambulatory Visit: Payer: Medicaid Other | Admitting: Internal Medicine

## 2021-04-26 ENCOUNTER — Encounter: Payer: Self-pay | Admitting: Internal Medicine

## 2021-04-26 VITALS — BP 129/77 | HR 62 | Temp 97.5°F | Ht 61.0 in | Wt 171.0 lb

## 2021-04-26 DIAGNOSIS — K219 Gastro-esophageal reflux disease without esophagitis: Secondary | ICD-10-CM | POA: Diagnosis not present

## 2021-04-26 DIAGNOSIS — Z86010 Personal history of colonic polyps: Secondary | ICD-10-CM

## 2021-04-26 DIAGNOSIS — K7460 Unspecified cirrhosis of liver: Secondary | ICD-10-CM

## 2021-04-26 NOTE — Progress Notes (Signed)
? ? ?Primary Care Physician:  Celene Squibb, MD ?Primary Gastroenterologist:  Dr. Gala Romney ? ?Pre-Procedure History & Physical: ?HPI:  Rachel Gross is a 56 y.o. female here for follow up of GERD.  History of advanced adenoma removed from her colon previously.  Due for surveillance colonoscopy in 1 year.  No dysphagia.  No rectal bleeding or melena.  Eradicated HCV with Epclusa.  Portal gastropathy on prior EGD but no varices.  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  She continues to smoke.  However, she no longer drinks alcohol or illicit drugs.  She needs updated labs and hepatoma screening. ?Past Medical History:  ?Diagnosis Date  ? Alcoholic hepatitis without ascites   ? Anxiety   ? Arthritis   ? knees, hands  ? 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 Bipolar disorder ([place])   ? Cancer [facility])   ? right lung  ? COPD (chronic obstructive pulmonary disease) ([place])   ? Depression   ? Dyspnea   ? Emphysema lung ([place])   ? GERD (gastroesophageal reflux disease)   ? Hepatitis C   ? s/p treatment with Epclusa  ? History of HPV infection   ? History of pneumonia   ? Pneumonia   ? Smoker   ? ? ?Past Surgical History:  ?Procedure Laterality Date  ? BIOPSY  05/02/2018  ? Procedure: BIOPSY;  Surgeon: Daneil Dolin, MD;  [facility];  Service: Endoscopy;;  gastric  ? CESAREAN SECTION    ? CHEST TUBE INSERTION Right 06/26/2019  ? Procedure: Right CHEST TUBE REPLACEMENT;  Surgeon: [name], MD;  Location: [facility] OR;  Service: Thoracic;  Laterality: Right;  ? COLONOSCOPY WITH PROPOFOL N/A 03/03/2019  ? Procedure: COLONOSCOPY WITH PROPOFOL;  Surgeon: Daneil Dolin, MD;  [facility];  Service: Endoscopy;  Laterality: N/A;  9:15am  ? ESOPHAGOGASTRODUODENOSCOPY (EGD) WITH PROPOFOL N/A 05/02/2018  ? Procedure: ESOPHAGOGASTRODUODENOSCOPY (EGD) WITH PROPOFOL;  Surgeon: Daneil Dolin, MD;  [facility];  Service: Endoscopy;  Laterality:  N/A;  8:30am  ? EYE SURGERY Bilateral   ? cataract  ? HEMOSTASIS CLIP PLACEMENT  03/03/2019  ? Procedure: HEMOSTASIS CLIP PLACEMENT;  Surgeon: Daneil Dolin, MD;  [facility];  Service: Endoscopy;;  ? INTERCOSTAL NERVE BLOCK Right 06/19/2019  ? Procedure: Intercostal Nerve Block;  Surgeon: [name], MD;  Location: [place];  Service: Thoracic;  Laterality: Right;  ? IR US GUIDE VASC ACCESS RIGHT  03/13/2018  ? LOBECTOMY    ? LYMPH NODE DISSECTION Right 06/19/2019  ? Procedure: Lymph Node Dissection;  Surgeon: [name], MD;  Location: [place];  Service: Thoracic;  Laterality: Right;  ? POLYPECTOMY  03/03/2019  ? Procedure: POLYPECTOMY;  Surgeon: Daneil Dolin, MD;  [facility];  Service: Endoscopy;;  ? PORTACATH PLACEMENT Left 09/08/2019  ? Procedure: INSERTION PORT-A-CATH LEFT CHEST (attached catheter in left subclavian);  Surgeon: Aviva Signs, MD;  Location: [facility];  Service: General;  Laterality: Left;  ? TONSILLECTOMY    ? TOOTH EXTRACTION  01/31/2018  ? x 7  ? VIDEO BRONCHOSCOPY WITH INSERTION OF INTERBRONCHIAL VALVE (IBV) N/A 06/26/2019  ? Procedure: VIDEO BRONCHOSCOPY WITH INSERTION OF TWO INTERBRONCHIAL VALVE (IBV);  Surgeon: [name], MD;  Location: [facility] OR;  Service: Thoracic;  Laterality: N/A;  ? VIDEO BRONCHOSCOPY WITH INSERTION OF INTERBRONCHIAL VALVE (IBV) N/A 08/08/2019  ? Procedure: VIDEO BRONCHOSCOPY WITH  REMOVAL OF INTERBRONCHIAL VALVE (IBV);  Surgeon: [name], MD;  Location: [place];  Service: Thoracic;  Laterality: N/A;  ? ? ?Prior to Admission medications   ?Medication Sig Start Date End Date Taking? Authorizing Provider  ?divalproex (DEPAKOTE) 250 MG DR tablet Take 250 mg by mouth at bedtime.    Yes [provider]  ?lidocaine-prilocaine (EMLA) cream Apply a small amount to port a cath site and cover with plastic wrap 1 hour prior to chemotherapy appointments 08/27/19  Yes Derek Jack, MD  ?pantoprazole (PROTONIX) 40  MG tablet TAKE 1 TABLET BY MOUTH DAILY BEFORE AND BREAKFAST 04/05/21  Yes Annitta Needs, NP  ?risperiDONE (RISPERDAL) 2 MG tablet Take 2 mg by mouth at bedtime.    Yes [provider]  ?SYMBICORT 160-4.5 MCG/ACT inhaler Inhale 2 puffs into the lungs 2 (two) times daily. 08/26/20  Yes Verlon Au, NP  ?albuterol (VENTOLIN HFA) 108 (90 Base) MCG/ACT inhaler Inhale 2 puffs into the lungs every 4 (four) hours as needed for wheezing or shortness of breath. ?Patient not taking: Reported on 04/26/2021 01/17/19   [provider]  ?dexamethasone (DECADRON) 6 MG tablet TAKE 1 TABLET BY MOUTH EVERY DAY FOR 10 DAYS ?Patient not taking: Reported on 04/26/2021 02/17/20   [provider]  ?diphenhydrAMINE (BENADRYL) 25 MG tablet Take 25 mg by mouth at bedtime.  ?Patient not taking: Reported on 04/26/2021    [provider]  ?folic acid (FOLVITE) 1 MG tablet Take 1 mg by mouth daily. ?Patient not taking: Reported on 04/26/2021 10/30/19   [provider]  ?levofloxacin (LEVAQUIN) 500 MG tablet Take 1 tablet (500 mg total) by mouth daily. ?Patient not taking: Reported on 04/26/2021 12/07/20   Derek Jack, MD  ?ondansetron (ZOFRAN ODT) 8 MG disintegrating tablet Place 1 tablet under tongue every 8 hours as needed for nausea. You may begin using this medication on the third day after each chemotherapy treatment. ?Patient not taking: Reported on 03/09/2021 08/27/19   Derek Jack, MD  ?scopolamine (TRANSDERM-SCOP) 1 MG/3DAYS Place 1 patch (1.5 mg total) onto the skin every 3 (three) days. ?Patient not taking: Reported on 04/26/2021 08/27/19   Derek Jack, MD  ?traMADol (ULTRAM) 50 MG tablet Take 1 tablet (50 mg total) by mouth daily as needed. ?Patient not taking: Reported on 04/26/2021 10/21/19   Derek Jack, MD  ? ? ?Allergies as of 04/26/2021 - Review Complete 04/26/2021  ?Allergen Reaction Noted  ? Folic acid Anaphylaxis 08/14/2019  ? Penicillins Anaphylaxis  09/10/2017  ? Amoxicillin  09/10/2017  ? Fish allergy Nausea Only 05/24/2017  ? Other Swelling 08/12/2019  ? ? ?Family History  ?Problem Relation Age of Onset  ? Congenital heart disease Mother   ? Bipolar disorder Mother   ? Prostate cancer Brother   ? Alzheimer's disease Paternal Grandmother   ? Colon cancer Neg Hx   ? ? ?Social History  ? ?Socioeconomic History  ? Marital status: Single  ?  Spouse name: Not on file  ? Number of children: Not on file  ? Years of education: Not on file  ? Highest education level: Not on file  ?Occupational History  ? Not on file  ?Tobacco Use  ? Smoking status: Former  ?  Packs/day: 0.50  ?  Years: 38.00  ?  Pack years: 19.00  ?  Types: Cigarettes  ?  Quit date: 06/19/2019  ?  Years since quitting: 1.8  ? Smokeless tobacco: Never  ?Vaping Use  ? Vaping  Use: Never used  ?Substance and Sexual Activity  ? Alcohol use: Not Currently  ?  Comment: Last use of alcohol 03/2016  ? Drug use: Not Currently  ?  Types: Heroin  ?  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 Sexual activity: Not Currently  ?Other Topics Concern  ? Not on file  ?Social History Narrative  ? Not on file  ? ?Social Determinants of Health  ? ?Financial Resource Strain: Not on file  ?Food Insecurity: Not on file  ?Transportation Needs: Not on file  ?Physical Activity: Not on file  ?Stress: Not on file  ?Social Connections: Not on file  ?Intimate Partner Violence: Not on file  ? ? ?Review of Systems: ?See HPI, otherwise negative ROS ? ?Physical Exam: ?BP 129/77 (BP Location: Right Arm, Patient Position: Sitting, Cuff Size: Normal)   Pulse 62   Temp (!) 97.5 ?F (36.4 ?C) (Temporal)   Ht 5\' 1"  (1.549 m)   Wt 171 lb (77.6 kg)   SpO2 100%   BMI 32.31 kg/m?  ?General:   Alert,  Well-developed, well-nourished, pleasant and cooperative in NAD.  No cutaneous stigmata of chronic liver disease. ?Neck:  Supple; no masses or thyromegaly. No significant cervical adenopathy. ?Lungs:  Clear throughout to auscultation.   No wheezes, crackles, or rhonchi.  No acute distress. ?Heart:  Regular rate and rhythm; no murmurs, clicks, rubs,  or gallops. ?Abdomen: Non-distended, normal bowel sounds.  Soft and nontender without appreciable mass or hepatosplenomegaly.  ?Pulses:

## 2021-04-26 NOTE — Patient Instructions (Signed)
It was good to see you again today! ? ?Continue pantoprazole 40 mg every day (take it 30 minutes before meal) ? ?We will plan for a surveillance colonoscopy due to history of polyps 1 year from now ? ?CBC, Chem-12, INR AFP and ultrasound / elastography ? ?Further recommendations to follow once the above test results are back for review. ?

## 2021-04-26 NOTE — Progress Notes (Signed)
SA

## 2021-04-29 ENCOUNTER — Ambulatory Visit (HOSPITAL_COMMUNITY): Admission: RE | Admit: 2021-04-29 | Payer: Medicaid Other | Source: Ambulatory Visit

## 2021-05-17 ENCOUNTER — Other Ambulatory Visit (HOSPITAL_COMMUNITY): Payer: Medicaid Other

## 2021-05-19 ENCOUNTER — Ambulatory Visit (HOSPITAL_COMMUNITY): Admit: 2021-05-19 | Payer: Medicaid Other | Admitting: Internal Medicine

## 2021-05-19 ENCOUNTER — Encounter (HOSPITAL_COMMUNITY): Payer: Self-pay

## 2021-05-19 SURGERY — ESOPHAGOGASTRODUODENOSCOPY (EGD) WITH PROPOFOL
Anesthesia: Monitor Anesthesia Care

## 2021-06-06 ENCOUNTER — Ambulatory Visit (HOSPITAL_COMMUNITY)
Admission: RE | Admit: 2021-06-06 | Discharge: 2021-06-06 | Disposition: A | Discharge status: Home / Self Care | Payer: Medicaid Other | Source: Ambulatory Visit | Attending: Hematology | Admitting: Hematology

## 2021-06-06 ENCOUNTER — Inpatient Hospital Stay (HOSPITAL_COMMUNITY): Payer: Medicaid Other | Attending: Hematology

## 2021-06-06 ENCOUNTER — Encounter (HOSPITAL_COMMUNITY): Payer: Self-pay

## 2021-06-06 DIAGNOSIS — D693 Immune thrombocytopenic purpura: Secondary | ICD-10-CM

## 2021-06-06 DIAGNOSIS — J449 Chronic obstructive pulmonary disease, unspecified: Secondary | ICD-10-CM | POA: Diagnosis present

## 2021-06-06 DIAGNOSIS — R768 Other specified abnormal immunological findings in serum: Secondary | ICD-10-CM

## 2021-06-06 DIAGNOSIS — Z86010 Personal history of colonic polyps: Secondary | ICD-10-CM

## 2021-06-06 DIAGNOSIS — C3491 Malignant neoplasm of unspecified part of right bronchus or lung: Secondary | ICD-10-CM | POA: Insufficient documentation

## 2021-06-06 DIAGNOSIS — K7460 Unspecified cirrhosis of liver: Secondary | ICD-10-CM

## 2021-06-06 DIAGNOSIS — R195 Other fecal abnormalities: Secondary | ICD-10-CM

## 2021-06-06 LAB — CBC WITH DIFFERENTIAL/PLATELET
Abs Immature Granulocytes: 0.04 10*3/uL (ref 0.00–0.07)
Basophils Absolute: 0 10*3/uL (ref 0.0–0.1)
Basophils Relative: 0 %
Eosinophils Absolute: 0.1 10*3/uL (ref 0.0–0.5)
Eosinophils Relative: 1 %
HCT: 41.7 % (ref 36.0–46.0)
Hemoglobin: 14 g/dL (ref 12.0–15.0)
Immature Granulocytes: 1 %
Lymphocytes Relative: 29 %
Lymphs Abs: 2.2 10*3/uL (ref 0.7–4.0)
MCH: 32 pg (ref 26.0–34.0)
MCHC: 33.6 g/dL (ref 30.0–36.0)
MCV: 95.4 fL (ref 80.0–100.0)
Monocytes Absolute: 0.5 10*3/uL (ref 0.1–1.0)
Monocytes Relative: 6 %
Neutro Abs: 4.7 10*3/uL (ref 1.7–7.7)
Neutrophils Relative %: 63 %
Platelets: 153 10*3/uL (ref 150–400)
RBC: 4.37 MIL/uL (ref 3.87–5.11)
RDW: 13.9 % (ref 11.5–15.5)
WBC: 7.6 10*3/uL (ref 4.0–10.5)
nRBC: 0 % (ref 0.0–0.2)

## 2021-06-06 LAB — COMPREHENSIVE METABOLIC PANEL
ALT: 23 U/L (ref 0–44)
AST: 28 U/L (ref 15–41)
Albumin: 4.1 g/dL (ref 3.5–5.0)
Alkaline Phosphatase: 46 U/L (ref 38–126)
Anion gap: 9 (ref 5–15)
BUN: 12 mg/dL (ref 6–20)
CO2: 26 mmol/L (ref 22–32)
Calcium: 9 mg/dL (ref 8.9–10.3)
Chloride: 102 mmol/L (ref 98–111)
Creatinine, Ser: 0.61 mg/dL (ref 0.44–1.00)
GFR, Estimated: >60 mL/min (ref >60)
Glucose, Bld: 127 mg/dL — ABNORMAL HIGH (ref 70–99)
Potassium: 3.8 mmol/L (ref 3.5–5.1)
Sodium: 137 mmol/L (ref 135–145)
Total Bilirubin: 0.9 mg/dL (ref 0.3–1.2)
Total Protein: 7.3 g/dL (ref 6.5–8.1)

## 2021-06-06 LAB — PROTIME-INR
INR: 1 (ref 0.8–1.2)
Prothrombin Time: 12.9 seconds (ref 11.4–15.2)

## 2021-06-06 MED ORDER — IOHEXOL 300 MG/ML  SOLN
100.0000 mL | Freq: Once | INTRAMUSCULAR | Status: AC | PRN
  Administered 2021-06-06: 75 mL via INTRAVENOUS

## 2021-06-06 MED ORDER — HEPARIN SOD (PORK) LOCK FLUSH 100 UNIT/ML IV SOLN
INTRAVENOUS | Status: AC
  Administered 2021-06-06: 500 [IU]
  Filled 2021-06-06: qty 5

## 2021-06-06 NOTE — Progress Notes (Signed)
Patients port flushed without difficulty.  Good blood return noted with no bruising or swelling noted at site.  Patient remains accessed for CT scan.  ?

## 2021-06-06 NOTE — Patient Instructions (Signed)
[facility]  Discharge Instructions: Thank you for choosing [facility] to provide your oncology and hematology care.  If you have a lab appointment with the Cancer Center, please come in thru the Main Entrance and check in at the main information desk.  Wear comfortable clothing and clothing appropriate for easy access to any Portacath or PICC line.   We strive to give you quality time with your provider. You may need to reschedule your appointment if you arrive late (15 or more minutes).  Arriving late affects you and other patients whose appointments are after yours.  Also, if you miss three or more appointments without notifying the office, you may be dismissed from the clinic at the provider's discretion.      For prescription refill requests, have your pharmacy contact our office and allow 72 hours for refills to be completed.        To help prevent nausea and vomiting after your treatment, we encourage you to take your nausea medication as directed.  BELOW ARE SYMPTOMS THAT SHOULD BE REPORTED IMMEDIATELY: *FEVER GREATER THAN 100.4 F (38 C) OR HIGHER *CHILLS OR SWEATING *NAUSEA AND VOMITING THAT IS NOT CONTROLLED WITH YOUR NAUSEA MEDICATION *UNUSUAL SHORTNESS OF BREATH *UNUSUAL BRUISING OR BLEEDING *URINARY PROBLEMS (pain or burning when urinating, or frequent urination) *BOWEL PROBLEMS (unusual diarrhea, constipation, pain near the anus) TENDERNESS IN MOUTH AND THROAT WITH OR WITHOUT PRESENCE OF ULCERS (sore throat, sores in mouth, or a toothache) UNUSUAL RASH, SWELLING OR PAIN  UNUSUAL VAGINAL DISCHARGE OR ITCHING   Items with * indicate a potential emergency and should be followed up as soon as possible or go to the Emergency Department if any problems should occur.  Please show the CHEMOTHERAPY ALERT CARD or IMMUNOTHERAPY ALERT CARD at check-in to the Emergency Department and triage nurse.  Should you have questions after your visit or need to cancel  or reschedule your appointment, please contact [facility] [phone]  and follow the prompts.  Office hours are 8:00 a.m. to 4:30 p.m. Monday - Friday. Please note that voicemails left after 4:00 p.m. may not be returned until the following business day.  We are closed weekends and major holidays. You have access to a nurse at all times for urgent questions. Please call the main number to the clinic [phone] and follow the prompts.  For any non-urgent questions, you may also contact your provider using MyChart. We now offer e-Visits for anyone 18 and older to request care online for non-urgent symptoms. For details visit [email or web address].   Also download the MyChart app! Go to the app store, search "MyChart", open the app, select [place], and log in with your MyChart username and password.  Due to Covid, a mask is required upon entering the hospital/clinic. If you do not have a mask, one will be given to you upon arrival. For doctor visits, patients may have 1 support person aged 18 or older with them. For treatment visits, patients cannot have anyone with them due to current Covid guidelines and our immunocompromised population.  

## 2021-06-07 LAB — AFP TUMOR MARKER: AFP, Serum, Tumor Marker: 2.5 ng/mL (ref 0.0–9.2)

## 2021-06-08 ENCOUNTER — Telehealth: Payer: Self-pay | Admitting: *Deleted

## 2021-06-08 NOTE — Telephone Encounter (Signed)
Received VM from pt she needed to r/s her [place] she missed. ? ?[place] rescheduled. Called pt and it has been rescheduled. She voiced understanding. ?

## 2021-06-13 ENCOUNTER — Inpatient Hospital Stay (HOSPITAL_COMMUNITY): Payer: Medicaid Other | Admitting: Hematology

## 2021-06-13 NOTE — Progress Notes (Shared)
[facility] [address], [place] [address]   CLINIC:  Medical Oncology/Hematology  PCP:  Benita Stabile, MD [address] [place] [address] [phone]   REASON FOR VISIT:  Follow-up for ***  PRIOR THERAPY: ***  NGS Results: ***  CURRENT THERAPY: ***  BRIEF ONCOLOGIC HISTORY:  Oncology History  Adenocarcinoma of lung, stage 3, right (HCC)  07/02/2019 Initial Diagnosis   Adenocarcinoma of lung, stage 3, right (HCC)    07/18/2019 Genetic Testing   Foundation One     07/23/2019 Genetic Testing   PD-L1     09/09/2019 -  Chemotherapy   The patient had palonosetron (ALOXI) injection 0.25 mg, 0.25 mg, Intravenous,  Once, 4 of 4 cycles Administration: 0.25 mg (09/09/2019), 0.25 mg (10/21/2019), 0.25 mg (11/11/2019), 0.25 mg (09/30/2019) PEMEtrexed (ALIMTA) 700 mg in sodium chloride 0.9 % 100 mL chemo infusion, 400 mg/m2 = 700 mg (80 % of original dose 500 mg/m2), Intravenous,  Once, 4 of 4 cycles Dose modification: 400 mg/m2 (80 % of original dose 500 mg/m2, Cycle 1, Reason: Provider Judgment) Administration: 700 mg (09/09/2019), 900 mg (10/21/2019), 900 mg (11/11/2019), 900 mg (09/30/2019) CARBOplatin (PARAPLATIN) 480 mg in sodium chloride 0.9 % 250 mL chemo infusion, 480 mg (80 % of original dose 601.5 mg), Intravenous,  Once, 4 of 4 cycles Dose modification:   (original dose 601.5 mg, Cycle 1), 481.2 mg (80 % of original dose 601.5 mg, Cycle 1, Reason: Provider Judgment),   (original dose 601.5 mg, Cycle 1),   (original dose 601.5 mg, Cycle 3),   (original dose 601.5 mg, Cycle 4),   (original dose 601.5 mg, Cycle 2) Administration: 480 mg (09/09/2019), 600 mg (10/21/2019), 600 mg (11/11/2019), 500 mg (09/30/2019) fosaprepitant (EMEND) 150 mg in sodium chloride 0.9 % 145 mL IVPB, 150 mg, Intravenous,  Once, 4 of 4 cycles Administration: 150 mg (09/09/2019), 150 mg (10/21/2019), 150 mg (11/11/2019), 150 mg (09/30/2019)   for chemotherapy treatment.        CANCER STAGING: Cancer Staging  No matching staging information was found for the patient.  INTERVAL HISTORY:  [name], a 56 y.o. female, returns for routine follow-up of her ***. Rachel Gross was last seen on {XX/XX/XXXX}.   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    Bipolar disorder (HCC)    Cancer (HCC)    right lung   COPD (chronic obstructive pulmonary disease) (HCC)    Depression    Dyspnea    Emphysema lung (HCC)    GERD (gastroesophageal reflux disease)    Hepatitis C    s/p treatment with Epclusa   History of HPV infection    History of pneumonia    Pneumonia    Smoker    Past Surgical History:  Procedure Laterality Date   BIOPSY  05/02/2018   Procedure: BIOPSY;  Surgeon: Corbin Ade, MD;  [facility];  Service: Endoscopy;;  gastric   CESAREAN SECTION     CHEST TUBE INSERTION Right 06/26/2019   Procedure: Right CHEST TUBE REPLACEMENT;  Surgeon: Loreli Slot, MD;  Location: [facility] OR;  Service: Thoracic;  Laterality: Right;   COLONOSCOPY WITH PROPOFOL N/A 03/03/2019   Procedure: COLONOSCOPY WITH  PROPOFOL;  Surgeon: Corbin Ade, MD;  [facility];  Service: Endoscopy;  Laterality: N/A;  9:15am   ESOPHAGOGASTRODUODENOSCOPY (EGD) WITH PROPOFOL N/A 05/02/2018   Procedure: ESOPHAGOGASTRODUODENOSCOPY (EGD) WITH PROPOFOL;  Surgeon: Corbin Ade, MD;  [facility];  Service: Endoscopy;  Laterality: N/A;  8:30am   EYE SURGERY Bilateral    cataract   HEMOSTASIS CLIP PLACEMENT  03/03/2019   Procedure: HEMOSTASIS CLIP PLACEMENT;  Surgeon: Corbin Ade, MD;  [facility];  Service: Endoscopy;;   INTERCOSTAL NERVE BLOCK Right 06/19/2019   Procedure: Intercostal Nerve Block;  Surgeon:  Loreli Slot, MD;  Location: [facility] OR;  Service: Thoracic;  Laterality: Right;   IR US GUIDE VASC ACCESS RIGHT  03/13/2018   LOBECTOMY     LYMPH NODE DISSECTION Right 06/19/2019   Procedure: Lymph Node Dissection;  Surgeon: Loreli Slot, MD;  Location: [facility] OR;  Service: Thoracic;  Laterality: Right;   POLYPECTOMY  03/03/2019   Procedure: POLYPECTOMY;  Surgeon: Corbin Ade, MD;  [facility];  Service: Endoscopy;;   PORTACATH PLACEMENT Left 09/08/2019   Procedure: INSERTION PORT-A-CATH LEFT CHEST (attached catheter in left subclavian);  Surgeon: Franky Macho, MD;  Location: [facility];  Service: General;  Laterality: Left;   TONSILLECTOMY     TOOTH EXTRACTION  01/31/2018   x 7   VIDEO BRONCHOSCOPY WITH INSERTION OF INTERBRONCHIAL VALVE (IBV) N/A 06/26/2019   Procedure: VIDEO BRONCHOSCOPY WITH INSERTION OF TWO INTERBRONCHIAL VALVE (IBV);  Surgeon: Loreli Slot, MD;  Location: [facility] OR;  Service: Thoracic;  Laterality: N/A;   VIDEO BRONCHOSCOPY WITH INSERTION OF INTERBRONCHIAL VALVE (IBV) N/A 08/08/2019   Procedure: VIDEO BRONCHOSCOPY WITH REMOVAL OF INTERBRONCHIAL VALVE (IBV);  Surgeon: Loreli Slot, MD;  Location: [facility] OR;  Service: Thoracic;  Laterality: N/A;    SOCIAL HISTORY:  Social History   Socioeconomic History   Marital status: Single    Spouse name: Not on file   Number of children: Not on file   Years of education: Not on file   Highest education level: Not on file  Occupational History   Not on file  Tobacco Use   Smoking status: Former    Packs/day: 0.50    Years: 38.00    Pack years: 19.00    Types: Cigarettes    Quit date: 06/19/2019    Years since quitting: 1.9   Smokeless tobacco: Never  Vaping Use   Vaping Use: Never used  Substance and Sexual Activity   Alcohol use: Not Currently    Comment: Last use of alcohol 03/2016   Drug use: Not Currently    Types: 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   Sexual activity: Not Currently  Other Topics  Concern   Not on file  Social History Narrative   Not on file   Social Determinants of Health   Financial Resource Strain: Not on file  Food Insecurity: Not on file  Transportation Needs: Not on file  Physical Activity: Not on file  Stress: Not on file  Social Connections: Not on file  Intimate Partner Violence: Not on file    FAMILY HISTORY:  Family History  Problem Relation Age of Onset   Congenital heart disease Mother    Bipolar disorder Mother    Prostate cancer Brother    Alzheimer's disease Paternal Grandmother    Colon cancer Neg Hx     CURRENT MEDICATIONS:  Current Outpatient Medications  Medication Sig Dispense Refill  albuterol (VENTOLIN HFA) 108 (90 Base) MCG/ACT inhaler Inhale 2 puffs into the lungs every 4 (four) hours as needed for wheezing or shortness of breath. (Patient not taking: Reported on 04/26/2021)     dexamethasone (DECADRON) 6 MG tablet TAKE 1 TABLET BY MOUTH EVERY DAY FOR 10 DAYS (Patient not taking: Reported on 04/26/2021)     diphenhydrAMINE (BENADRYL) 25 MG tablet Take 25 mg by mouth at bedtime.  (Patient not taking: Reported on 04/26/2021)     divalproex (DEPAKOTE) 250 MG DR tablet Take 250 mg by mouth at bedtime.      folic acid (FOLVITE) 1 MG tablet Take 1 mg by mouth daily. (Patient not taking: Reported on 04/26/2021)     levofloxacin (LEVAQUIN) 500 MG tablet Take 1 tablet (500 mg total) by mouth daily. (Patient not taking: Reported on 04/26/2021) 10 tablet 0   lidocaine-prilocaine (EMLA) cream Apply a small amount to port a cath site and cover with plastic wrap 1 hour prior to chemotherapy appointments 30 g 3   ondansetron (ZOFRAN ODT) 8 MG disintegrating tablet Place 1 tablet under tongue every 8 hours as needed for nausea. You may begin using this medication on the third day after each chemotherapy treatment. (Patient not taking: Reported on 03/09/2021) 20 tablet 1   pantoprazole (PROTONIX) 40 MG tablet TAKE 1 TABLET BY MOUTH DAILY BEFORE AND  BREAKFAST 90 tablet 1   risperiDONE (RISPERDAL) 2 MG tablet Take 2 mg by mouth at bedtime.      scopolamine (TRANSDERM-SCOP) 1 MG/3DAYS Place 1 patch (1.5 mg total) onto the skin every 3 (three) days. (Patient not taking: Reported on 04/26/2021) 10 patch 12   SYMBICORT 160-4.5 MCG/ACT inhaler Inhale 2 puffs into the lungs 2 (two) times daily. 1 each 6   traMADol (ULTRAM) 50 MG tablet Take 1 tablet (50 mg total) by mouth daily as needed. (Patient not taking: Reported on 04/26/2021) 30 tablet 0   No current facility-administered medications for this visit.    ALLERGIES:  Allergies  Allergen Reactions   Folic Acid Anaphylaxis    Not entirely clear if it is from folic acid. She is taking folic acid now and doesn't have the throat swelling anymore   Penicillins Anaphylaxis    Throat swelled Did it involve swelling of the face/tongue/throat, SOB, or low BP? Yes Did it involve sudden or severe rash/hives, skin peeling, or any reaction on the inside of your mouth or nose? No Did you need to seek medical attention at a hospital or doctor's office? No When did it last happen?      childhood allergy If all above answers are "NO", may proceed with cephalosporin use.    Amoxicillin     hallucinations Did it involve swelling of the face/tongue/throat, SOB, or low BP? No Did it involve sudden or severe rash/hives, skin peeling, or any reaction on the inside of your mouth or nose? No Did you need to seek medical attention at a hospital or doctor's office? Yes When did it last happen?      July 2019 If all above answers are "NO", may proceed with cephalosporin use.    Fish Allergy Nausea Only   Other Swelling    Patient states that the sunrise brand folic acid made her feel like her throat was swelling.     PHYSICAL EXAM:  Performance status (ECOG): {CHL ONC DG:[id number]}  There were no vitals filed for this visit. Wt Readings from Last 3 Encounters:  04/26/21 171 lb (77.6 kg)  02/25/21 161 lb  (73 kg)  12/07/20 161 lb 3.2 oz (73.1 kg)   Physical Exam   LABORATORY DATA:  I have reviewed the labs as listed.     Latest Ref Rng & Units 06/06/2021    2:26 PM 02/25/2021    2:11 PM 11/30/2020    1:18 PM  CBC  WBC 4.0 - 10.5 K/uL 7.6   10.8   13.2    Hemoglobin 12.0 - 15.0 g/dL 16.1   09.6   04.5    Hematocrit 36.0 - 46.0 % 41.7   47.7   38.0    Platelets 150 - 400 K/uL 153   162   174        Latest Ref Rng & Units 06/06/2021    2:26 PM 02/25/2021    2:11 PM 11/30/2020    1:18 PM  CMP  Glucose 70 - 99 mg/dL 409   811   914    BUN 6 - 20 mg/dL 12   8   7     Creatinine 0.44 - 1.00 mg/dL 7.82   9.56   2.13    Sodium 135 - 145 mmol/L 137   135   130    Potassium 3.5 - 5.1 mmol/L 3.8   4.1   3.1    Chloride 98 - 111 mmol/L 102   98   95    CO2 22 - 32 mmol/L 26   27   25     Calcium 8.9 - 10.3 mg/dL 9.0   9.2   8.4    Total Protein 6.5 - 8.1 g/dL 7.3    7.3    Total Bilirubin 0.3 - 1.2 mg/dL 0.9    0.8    Alkaline Phos 38 - 126 U/L 46    55    AST 15 - 41 U/L 28    24    ALT 0 - 44 U/L 23    17      DIAGNOSTIC IMAGING:  I have independently reviewed the scans and discussed with the patient. CT Chest W Contrast  Result Date: 06/08/2021 CLINICAL DATA:  56 year old female with history of non-small cell lung cancer. Follow-up study. * Tracking Code: BO * EXAM: CT CHEST WITH CONTRAST TECHNIQUE: Multidetector CT imaging of the chest was performed during intravenous contrast administration. RADIATION DOSE REDUCTION: This exam was performed according to the departmental dose-optimization program which includes automated exposure control, adjustment of the mA and/or kV according to patient size and/or use of iterative reconstruction technique. CONTRAST:  75mL OMNIPAQUE IOHEXOL 300 MG/ML  SOLN COMPARISON:  Chest CTA 02/25/2021. FINDINGS: Cardiovascular: Heart size is normal. There is no significant pericardial fluid, thickening or pericardial calcification. There is aortic atherosclerosis, as  well as atherosclerosis of the great vessels of the mediastinum and the coronary arteries, including calcified atherosclerotic plaque in the left main, left anterior descending, left circumflex and right coronary arteries. Mild calcifications of the aortic valve. Mediastinum/Nodes: No pathologically enlarged mediastinal or hilar lymph nodes. Esophagus is unremarkable in appearance. No axillary lymphadenopathy. Lungs/Pleura: Postoperative changes of right lower lobectomy. Compensatory hyperexpansion of the right middle and upper lobes. Small pulmonary nodules are again noted in the right middle lobe, largest of which is associated with the minor fissure (axial image 84 of series 4) measuring 7 x 5 mm (mean diameter of 6 mm), stable. No other larger more suspicious appearing pulmonary nodules or masses are noted. No acute consolidative airspace disease. No pleural effusions. Mild diffuse bronchial wall  thickening with mild centrilobular and paraseptal emphysema. Upper Abdomen: Severe diffuse low attenuation throughout the visualized hepatic parenchyma, indicative of a background of hepatic steatosis. Liver also has a shrunken appearance and nodular contour, indicative of underlying cirrhosis. Aortic atherosclerosis. Musculoskeletal: There are no aggressive appearing lytic or blastic lesions noted in the visualized portions of the skeleton. IMPRESSION: 1. Stable examination demonstrating postoperative changes of prior right lower lobectomy with no definitive findings to suggest recurrent or metastatic disease in the thorax. 2. Small right middle lobe pulmonary nodules are stable, likely benign. Continued attention on routine follow-up imaging is recommended. 3. Mild diffuse bronchial wall thickening with mild centrilobular and paraseptal emphysema; imaging findings suggestive of underlying COPD. 4. Aortic atherosclerosis, in addition to left main and three-vessel coronary artery disease. Please note that although the  presence of coronary artery calcium documents the presence of coronary artery disease, the severity of this disease and any potential stenosis cannot be assessed on this non-gated CT examination. Assessment for potential risk factor modification, dietary therapy or pharmacologic therapy may be warranted, if clinically indicated. 5. There are calcifications of the aortic valve. Echocardiographic correlation for evaluation of potential valvular dysfunction may be warranted if clinically indicated. 6. Hepatic cirrhosis with hepatic steatosis. Aortic Atherosclerosis (ICD10-I70.0) and Emphysema (ICD10-J43.9). Electronically Signed   By: Trudie Reed M.D.   On: 06/08/2021 08:35     ASSESSMENT:  ***   PLAN:  ***   Orders placed this encounter:  No orders of the defined types were placed in this encounter.    Doreatha Massed, MD [facility] [phone]   I, Alda Ponder, am acting as a scribe for Dr. Doreatha Massed.  {Add Production assistant, radio Statement}

## 2021-06-20 ENCOUNTER — Ambulatory Visit (HOSPITAL_COMMUNITY)
Admission: RE | Admit: 2021-06-20 | Discharge: 2021-06-20 | Disposition: A | Discharge status: Home / Self Care | Payer: Medicaid Other | Source: Ambulatory Visit | Attending: Internal Medicine | Admitting: Internal Medicine

## 2021-06-20 DIAGNOSIS — K7460 Unspecified cirrhosis of liver: Secondary | ICD-10-CM | POA: Insufficient documentation

## 2021-07-05 ENCOUNTER — Inpatient Hospital Stay (HOSPITAL_COMMUNITY): Payer: Medicaid Other | Attending: Hematology | Admitting: Hematology

## 2021-07-05 VITALS — BP 110/50 | HR 82 | Temp 98.7°F | Resp 18 | Ht 61.0 in | Wt 168.3 lb

## 2021-07-05 DIAGNOSIS — Z08 Encounter for follow-up examination after completed treatment for malignant neoplasm: Secondary | ICD-10-CM | POA: Diagnosis present

## 2021-07-05 DIAGNOSIS — C3491 Malignant neoplasm of unspecified part of right bronchus or lung: Secondary | ICD-10-CM

## 2021-07-05 DIAGNOSIS — D693 Immune thrombocytopenic purpura: Secondary | ICD-10-CM | POA: Diagnosis present

## 2021-07-05 DIAGNOSIS — Z85118 Personal history of other malignant neoplasm of bronchus and lung: Secondary | ICD-10-CM | POA: Insufficient documentation

## 2021-07-05 DIAGNOSIS — J449 Chronic obstructive pulmonary disease, unspecified: Secondary | ICD-10-CM

## 2021-07-05 NOTE — Patient Instructions (Signed)
[place] at [facility] Discharge Instructions   You were seen and examined today by Dr. Delton Coombes.  He reviewed the results of your lab work and CT scan. All results are normal/stable.   Return as scheduled in 6 months - we will repeat blood work and CT scan prior to next visit.    Thank you for choosing [place] at [facility] to provide your oncology and hematology care.  To afford each patient quality time with our provider, please arrive at least 15 minutes before your scheduled appointment time.   If you have a lab appointment with the [facility] please come in thru the Main Entrance and check in at the main information desk.  You need to re-schedule your appointment should you arrive 10 or more minutes late.  We strive to give you quality time with our providers, and arriving late affects you and other patients whose appointments are after yours.  Also, if you no show three or more times for appointments you may be dismissed from the clinic at the providers discretion.     Again, thank you for choosing [facility].  Our hope is that these requests will decrease the amount of time that you wait before being seen by our physicians.       _____________________________________________________________  Should you have questions after your visit to [facility], please contact our office at [phone] and follow the prompts.  Our office hours are 8:00 a.m. and 4:30 p.m. Monday - Friday.  Please note that voicemails left after 4:00 p.m. may not be returned until the following business day.  We are closed weekends and major holidays.  You do have access to a nurse 24-7, just call the main number to the clinic [phone] and do not press any options, hold on the line and a nurse will answer the phone.    For prescription refill requests, have your pharmacy contact our office and allow 72 hours.    Due to Covid,  you will need to wear a mask upon entering the hospital. If you do not have a mask, a mask will be given to you at the Main Entrance upon arrival. For doctor visits, patients may have 1 support person age 24 or older with them. For treatment visits, patients can not have anyone with them due to social distancing guidelines and our immunocompromised population.

## 2021-07-05 NOTE — Progress Notes (Signed)
[facility] [address], [place] [address]   CLINIC:  Medical Oncology/Hematology  PCP:  Celene Squibb, MD [address] [name] [address] [phone]   REASON FOR VISIT:  Follow-up for stage III right lung cancer  PRIOR THERAPY:  1. Right lower lobectomy on 06/19/2019. 2. Carboplatin and pemetrexed x 4 cycles from 09/09/2019 to 11/11/2019.  NGS Results: PD-L1 30%, Foundation 1 MS--stable, TMB 9 Muts/Mb  CURRENT THERAPY: surveillance  BRIEF ONCOLOGIC HISTORY:  Oncology History  Adenocarcinoma of lung, stage 3, right ([facility])  07/02/2019 Initial Diagnosis   Adenocarcinoma of lung, stage 3, right ([facility])    07/18/2019 Genetic Testing   Foundation One     07/23/2019 Genetic Testing   PD-L1     09/09/2019 -  Chemotherapy   The patient had palonosetron (ALOXI) injection 0.25 mg, 0.25 mg, Intravenous,  Once, 4 of 4 cycles Administration: 0.25 mg (09/09/2019), 0.25 mg (10/21/2019), 0.25 mg (11/11/2019), 0.25 mg (09/30/2019) PEMEtrexed (ALIMTA) 700 mg in sodium chloride 0.9 % 100 mL chemo infusion, 400 mg/m2 = 700 mg (80 % of original dose 500 mg/m2), Intravenous,  Once, 4 of 4 cycles Dose modification: 400 mg/m2 (80 % of original dose 500 mg/m2, Cycle 1, Reason: Provider Judgment) Administration: 700 mg (09/09/2019), 900 mg (10/21/2019), 900 mg (11/11/2019), 900 mg (09/30/2019) CARBOplatin (PARAPLATIN) 480 mg in sodium chloride 0.9 % 250 mL chemo infusion, 480 mg (80 % of original dose 601.5 mg), Intravenous,  Once, 4 of 4 cycles Dose modification:   (original dose 601.5 mg, Cycle 1), 481.2 mg (80 % of original dose 601.5 mg, Cycle 1, Reason: Provider Judgment),   (original dose 601.5 mg, Cycle 1),   (original dose 601.5 mg, Cycle 3),   (original dose 601.5 mg, Cycle 4),   (original dose 601.5 mg, Cycle 2) Administration: 480 mg (09/09/2019), 600 mg (10/21/2019), 600 mg (11/11/2019), 500 mg (09/30/2019) fosaprepitant (EMEND) 150 mg in sodium chloride 0.9 %  145 mL IVPB, 150 mg, Intravenous,  Once, 4 of 4 cycles Administration: 150 mg (09/09/2019), 150 mg (10/21/2019), 150 mg (11/11/2019), 150 mg (09/30/2019)   for chemotherapy treatment.       CANCER STAGING: Cancer Staging  No matching staging information was found for the patient.  INTERVAL HISTORY:  Ms. Chloie Loney, a 56 y.o. female, returns for routine follow-up of her stage III right lung cancer. Kirsten was last seen on 12/07/2020.   Today she reports feeling good. She denies recent infections. She denies SOB, CP, and bleeding issues.   REVIEW OF SYSTEMS:  Review of Systems  Constitutional:  Negative for appetite change and fatigue.  HENT:   Negative for nosebleeds.   Respiratory:  Positive for cough. Negative for hemoptysis and shortness of breath.   Cardiovascular:  Positive for chest pain.  Gastrointestinal:  Positive for diarrhea. Negative for blood in stool.  Genitourinary:  Negative for hematuria.   Psychiatric/Behavioral:  Positive for sleep disturbance. The patient is nervous/anxious.   All other systems reviewed and are negative.  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    Bipolar disorder ([facility])    Cancer (HCC)    right lung   COPD (chronic obstructive pulmonary disease) (HCC)    Depression    Dyspnea    Emphysema lung (HCC)  GERD (gastroesophageal reflux disease)    Hepatitis C    s/p treatment with Epclusa   History of HPV infection    History of pneumonia    Pneumonia    Smoker    Past Surgical History:  Procedure Laterality Date   BIOPSY  05/02/2018   Procedure: BIOPSY;  Surgeon: Daneil Dolin, MD;  [facility];  Service: Endoscopy;;  gastric   CESAREAN SECTION     CHEST TUBE INSERTION Right 06/26/2019   Procedure: Right CHEST TUBE REPLACEMENT;  Surgeon:  [name], MD;  Location: [facility];  Service: Thoracic;  Laterality: Right;   COLONOSCOPY WITH PROPOFOL N/A 03/03/2019   Procedure: COLONOSCOPY WITH PROPOFOL;  Surgeon: Daneil Dolin, MD;  [facility];  Service: Endoscopy;  Laterality: N/A;  9:15am   ESOPHAGOGASTRODUODENOSCOPY (EGD) WITH PROPOFOL N/A 05/02/2018   Procedure: ESOPHAGOGASTRODUODENOSCOPY (EGD) WITH PROPOFOL;  Surgeon: Daneil Dolin, MD;  [facility];  Service: Endoscopy;  Laterality: N/A;  8:30am   EYE SURGERY Bilateral    cataract   HEMOSTASIS CLIP PLACEMENT  03/03/2019   Procedure: HEMOSTASIS CLIP PLACEMENT;  Surgeon: Daneil Dolin, MD;  [facility];  Service: Endoscopy;;   INTERCOSTAL NERVE BLOCK Right 06/19/2019   Procedure: Intercostal Nerve Block;  Surgeon: [name], MD;  Location: [facility];  Service: Thoracic;  Laterality: Right;   IR US GUIDE VASC ACCESS RIGHT  03/13/2018   LOBECTOMY     LYMPH NODE DISSECTION Right 06/19/2019   Procedure: Lymph Node Dissection;  Surgeon: [name], MD;  Location: [facility];  Service: Thoracic;  Laterality: Right;   POLYPECTOMY  03/03/2019   Procedure: POLYPECTOMY;  Surgeon: Daneil Dolin, MD;  [facility];  Service: Endoscopy;;   PORTACATH PLACEMENT Left 09/08/2019   Procedure: INSERTION PORT-A-CATH LEFT CHEST (attached catheter in left subclavian);  Surgeon: Aviva Signs, MD;  Location: [facility];  Service: General;  Laterality: Left;   TONSILLECTOMY     TOOTH EXTRACTION  01/31/2018   x 7   VIDEO BRONCHOSCOPY WITH INSERTION OF INTERBRONCHIAL VALVE (IBV) N/A 06/26/2019   Procedure: VIDEO BRONCHOSCOPY WITH INSERTION OF TWO INTERBRONCHIAL VALVE (IBV);  Surgeon: [name], MD;  Location[facility];  Service: Thoracic;  Laterality: N/A;   VIDEO BRONCHOSCOPY WITH INSERTION OF INTERBRONCHIAL VALVE (IBV) N/A 08/08/2019   Procedure: VIDEO BRONCHOSCOPY WITH REMOVAL OF INTERBRONCHIAL VALVE (IBV);  Surgeon: [name], MD;  Location: [facility];  Service: Thoracic;  Laterality: N/A;    SOCIAL HISTORY:  Social History   Socioeconomic History   Marital status: Single    Spouse name: Not on file   Number of children: Not on file   Years of education: Not on file   Highest education level: Not on file  Occupational History   Not on file  Tobacco Use   Smoking status: Former    Packs/day: 0.50    Years: 38.00    Pack years: 19.00    Types: Cigarettes    Quit date: 06/19/2019    Years since quitting: 2.0   Smokeless tobacco: Never  Vaping Use   Vaping Use: Never used  Substance and Sexual Activity   Alcohol use: Not Currently    Comment: Last use of alcohol 03/2016   Drug use: Not Currently    Types: 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   Sexual activity: Not Currently  Other Topics Concern   Not on file  Social History Narrative   Not on file   Social Determinants of Health   Financial Resource Strain: Not on file  Food Insecurity: Not on file  Transportation Needs: Not on file  Physical Activity: Not on file  Stress: Not on file  Social Connections: Not on file  Intimate Partner Violence: Not on file    FAMILY HISTORY:  Family History  Problem Relation Age of Onset   Congenital heart disease Mother    Bipolar disorder Mother    Prostate cancer Brother    Alzheimer's disease Paternal Grandmother    Colon cancer Neg Hx     CURRENT MEDICATIONS:  Current Outpatient Medications  Medication Sig Dispense Refill   albuterol (VENTOLIN HFA) 108 (90 Base) MCG/ACT inhaler Inhale 2 puffs into the lungs every 4 (four) hours as needed for wheezing or shortness of breath. (Patient not taking: Reported on 04/26/2021)     dexamethasone (DECADRON) 6 MG tablet TAKE 1 TABLET BY MOUTH EVERY DAY FOR 10 DAYS (Patient not taking: Reported on 04/26/2021)     diphenhydrAMINE (BENADRYL) 25 MG tablet Take 25 mg by mouth at bedtime.  (Patient not taking: Reported on 04/26/2021)     divalproex (DEPAKOTE) 250 MG DR  tablet Take 250 mg by mouth at bedtime.      folic acid (FOLVITE) 1 MG tablet Take 1 mg by mouth daily. (Patient not taking: Reported on 04/26/2021)     levofloxacin (LEVAQUIN) 500 MG tablet Take 1 tablet (500 mg total) by mouth daily. (Patient not taking: Reported on 04/26/2021) 10 tablet 0   lidocaine-prilocaine (EMLA) cream Apply a small amount to port a cath site and cover with plastic wrap 1 hour prior to chemotherapy appointments 30 g 3   ondansetron (ZOFRAN ODT) 8 MG disintegrating tablet Place 1 tablet under tongue every 8 hours as needed for nausea. You may begin using this medication on the third day after each chemotherapy treatment. (Patient not taking: Reported on 03/09/2021) 20 tablet 1   pantoprazole (PROTONIX) 40 MG tablet TAKE 1 TABLET BY MOUTH DAILY BEFORE AND BREAKFAST 90 tablet 1   risperiDONE (RISPERDAL) 2 MG tablet Take 2 mg by mouth at bedtime.      scopolamine (TRANSDERM-SCOP) 1 MG/3DAYS Place 1 patch (1.5 mg total) onto the skin every 3 (three) days. (Patient not taking: Reported on 04/26/2021) 10 patch 12   SYMBICORT 160-4.5 MCG/ACT inhaler Inhale 2 puffs into the lungs 2 (two) times daily. 1 each 6   traMADol (ULTRAM) 50 MG tablet Take 1 tablet (50 mg total) by mouth daily as needed. (Patient not taking: Reported on 04/26/2021) 30 tablet 0   No current facility-administered medications for this visit.    ALLERGIES:  Allergies  Allergen Reactions   Folic Acid Anaphylaxis    Not entirely clear if it is from folic acid. She is taking folic acid now and doesn't have the throat swelling anymore   Penicillins Anaphylaxis    Throat swelled Did it involve swelling of the face/tongue/throat, SOB, or low BP? Yes Did it involve sudden or severe rash/hives, skin peeling, or any reaction on the inside of your mouth or nose? No Did you need to seek medical attention at a hospital or doctor's office? No When did it last happen?      childhood allergy If all above answers are "NO", may  proceed with cephalosporin use.    Amoxicillin     hallucinations Did it involve swelling of the face/tongue/throat, SOB, or low BP? No  Did it involve sudden or severe rash/hives, skin peeling, or any reaction on the inside of your mouth or nose? No Did you need to seek medical attention at a hospital or doctor's office? Yes When did it last happen?      July 2019 If all above answers are "NO", may proceed with cephalosporin use.    Fish Allergy Nausea Only   Other Swelling    Patient states that the sunrise brand folic acid made her feel like her throat was swelling.     PHYSICAL EXAM:  Performance status (ECOG): 1 - Symptomatic but completely ambulatory  There were no vitals filed for this visit. Wt Readings from Last 3 Encounters:  04/26/21 171 lb (77.6 kg)  02/25/21 161 lb (73 kg)  12/07/20 161 lb 3.2 oz (73.1 kg)   Physical Exam Vitals reviewed.  Constitutional:      Appearance: Normal appearance. She is obese.  Cardiovascular:     Rate and Rhythm: Normal rate and regular rhythm.     Pulses: Normal pulses.     Heart sounds: Normal heart sounds.  Pulmonary:     Effort: Pulmonary effort is normal.     Breath sounds: Normal breath sounds.  Neurological:     General: No focal deficit present.     Mental Status: She is alert and oriented to person, place, and time.  Psychiatric:        Mood and Affect: Mood normal.        Behavior: Behavior normal.     LABORATORY DATA:  I have reviewed the labs as listed.     Latest Ref Rng & Units 06/06/2021    2:26 PM 02/25/2021    2:11 PM 11/30/2020    1:18 PM  CBC  WBC 4.0 - 10.5 K/uL 7.6   10.8   13.2    Hemoglobin 12.0 - 15.0 g/dL 14.0   15.6   13.4    Hematocrit 36.0 - 46.0 % 41.7   47.7   38.0    Platelets 150 - 400 K/uL 153   162   174        Latest Ref Rng & Units 06/06/2021    2:26 PM 02/25/2021    2:11 PM 11/30/2020    1:18 PM  CMP  Glucose 70 - 99 mg/dL 127   108   117    BUN 6 - 20 mg/dL _0 Creatinine  0.44 - 1.00 mg/dL 0.61   0.62   0.46    Sodium 135 - 145 mmol/L 137   135   130    Potassium 3.5 - 5.1 mmol/L 3.8   4.1   3.1    Chloride 98 - 111 mmol/L 102   98   95    CO2 22 - 32 mmol/L _1 Calcium 8.9 - 10.3 mg/dL 9.0   9.2   8.4    Total Protein 6.5 - 8.1 g/dL 7.3    7.3    Total Bilirubin 0.3 - 1.2 mg/dL 0.9    0.8    Alkaline Phos 38 - 126 U/L 46    55    AST 15 - 41 U/L 28    24    ALT 0 - 44 U/L 23    17      DIAGNOSTIC IMAGING:  I have independently reviewed the scans and discussed with the patient. CT Chest  W Contrast  Result Date: 06/08/2021 CLINICAL DATA:  56 year old female with history of non-small cell lung cancer. Follow-up study. * Tracking Code: BO * EXAM: CT CHEST WITH CONTRAST TECHNIQUE: Multidetector CT imaging of the chest was performed during intravenous contrast administration. RADIATION DOSE REDUCTION: This exam was performed according to the departmental dose-optimization program which includes automated exposure control, adjustment of the mA and/or kV according to patient size and/or use of iterative reconstruction technique. CONTRAST:  46m OMNIPAQUE IOHEXOL 300 MG/ML  SOLN COMPARISON:  Chest CTA 02/25/2021. FINDINGS: Cardiovascular: Heart size is normal. There is no significant pericardial fluid, thickening or pericardial calcification. There is aortic atherosclerosis, as well as atherosclerosis of the great vessels of the mediastinum and the coronary arteries, including calcified atherosclerotic plaque in the left main, left anterior descending, left circumflex and right coronary arteries. Mild calcifications of the aortic valve. Mediastinum/Nodes: No pathologically enlarged mediastinal or hilar lymph nodes. Esophagus is unremarkable in appearance. No axillary lymphadenopathy. Lungs/Pleura: Postoperative changes of right lower lobectomy. Compensatory hyperexpansion of the right middle and upper lobes. Small pulmonary nodules are again noted in the right  middle lobe, largest of which is associated with the minor fissure (axial image 84 of series 4) measuring 7 x 5 mm (mean diameter of 6 mm), stable. No other larger more suspicious appearing pulmonary nodules or masses are noted. No acute consolidative airspace disease. No pleural effusions. Mild diffuse bronchial wall thickening with mild centrilobular and paraseptal emphysema. Upper Abdomen: Severe diffuse low attenuation throughout the visualized hepatic parenchyma, indicative of a background of hepatic steatosis. Liver also has a shrunken appearance and nodular contour, indicative of underlying cirrhosis. Aortic atherosclerosis. Musculoskeletal: There are no aggressive appearing lytic or blastic lesions noted in the visualized portions of the skeleton. IMPRESSION: 1. Stable examination demonstrating postoperative changes of prior right lower lobectomy with no definitive findings to suggest recurrent or metastatic disease in the thorax. 2. Small right middle lobe pulmonary nodules are stable, likely benign. Continued attention on routine follow-up imaging is recommended. 3. Mild diffuse bronchial wall thickening with mild centrilobular and paraseptal emphysema; imaging findings suggestive of underlying COPD. 4. Aortic atherosclerosis, in addition to left main and three-vessel coronary artery disease. Please note that although the presence of coronary artery calcium documents the presence of coronary artery disease, the severity of this disease and any potential stenosis cannot be assessed on this non-gated CT examination. Assessment for potential risk factor modification, dietary therapy or pharmacologic therapy may be warranted, if clinically indicated. 5. There are calcifications of the aortic valve. Echocardiographic correlation for evaluation of potential valvular dysfunction may be warranted if clinically indicated. 6. Hepatic cirrhosis with hepatic steatosis. Aortic Atherosclerosis (ICD10-I70.0) and  Emphysema (ICD10-J43.9). Electronically Signed   By: DVinnie LangtonM.D.   On: 06/08/2021 08:35   [place]ABDOMEN COMPLETE W/ELASTOGRAPHY  Result Date: 06/20/2021 CLINICAL DATA:  Cirrhosis, screen for hepatocellular carcinoma and fibrosis EXAM: ULTRASOUND ABDOMEN ULTRASOUND HEPATIC ELASTOGRAPHY TECHNIQUE: Sonography of the upper abdomen was performed. In addition, ultrasound elastography evaluation of the liver was performed. A region of interest was placed within the right lobe of the liver. Following application of a compressive sonographic pulse, tissue compressibility was assessed. Multiple assessments were performed at the selected site. Median tissue compressibility was determined. Previously, hepatic stiffness was assessed by shear wave velocity. Based on recently published Society of Radiologists in Ultrasound consensus article, reporting is now recommended to be performed in the SI units of pressure (kiloPascals) representing hepatic stiffness/elasticity. The obtained result is compared  to the published reference standards. (cACLD = compensated Advanced Chronic Liver Disease) COMPARISON:  None Available. FINDINGS: ULTRASOUND ABDOMEN Gallbladder: No gallstones or wall thickening visualized. No sonographic Murphy sign noted by sonographer. Common bile duct: Diameter: 0.5 cm Liver: No focal lesion identified. Nodular contour of the liver. Increased, heterogeneous parenchymal echogenicity. Portal vein is patent on color Doppler imaging with normal direction of blood flow towards the liver. IVC: No abnormality visualized. Pancreas: Visualized portion unremarkable. Spleen: Size and appearance within normal limits. Right Kidney: Length: 12.0 cm. Echogenicity within normal limits. No mass or hydronephrosis visualized. Left Kidney: Length: 11.6 cm. Echogenicity within normal limits. No mass or hydronephrosis visualized. Abdominal aorta: No aneurysm visualized. Other findings: None. ULTRASOUND HEPATIC ELASTOGRAPHY  Device: Siemens Helix VTQ Patient position: Supine Transducer 5C1 Number of measurements: 10 Hepatic segment:  8 Median kPa: 6.6 IQR: 1.1 IQR/Median kPa ratio: 0.17 Data quality:  Good Diagnostic category: < or = 9 kPa: in the absence of other known clinical signs, rules out cACLD The use of hepatic elastography is applicable to patients with viral hepatitis and non-alcoholic fatty liver disease. At this time, there is insufficient data for the referenced cut-off values and use in other causes of liver disease, including alcoholic liver disease. Patients, however, may be assessed by elastography and serve as their own reference standard/baseline. In patients with non-alcoholic liver disease, the values suggesting compensated advanced chronic liver disease (cACLD) may be lower, and patients may need additional testing with elasticity results of 7-9 kPa. Please note that abnormal hepatic elasticity and shear wave velocities may also be identified in clinical settings other than with hepatic fibrosis, such as: acute hepatitis, elevated right heart and central venous pressures including use of beta blockers, veno-occlusive disease (Budd-Chiari), infiltrative processes such as mastocytosis/amyloidosis/infiltrative tumor/lymphoma, extrahepatic cholestasis, with hyperemia in the post-prandial state, and with liver transplantation. Correlation with patient history, laboratory data, and clinical condition recommended. Diagnostic Categories: < or =5 kPa: high probability of being normal < or =9 kPa: in the absence of other known clinical signs, rules out cACLD >9 kPa and ?13 kPa: suggestive of cACLD, but needs further testing >13 kPa: highly suggestive of cACLD > or =17 kPa: highly suggestive of cACLD with an increased probability of clinically significant portal hypertension IMPRESSION: ULTRASOUND ABDOMEN: 1. Coarse, nodular cirrhotic morphology of the liver. No focal liver lesion. Please note that multiphasic contrast  enhanced CT or MRI are the most sensitive test for the screening detection of hepatocellular carcinoma. 2.  Hepatic steatosis. ULTRASOUND HEPATIC ELASTOGRAPHY: Median kPa:  6.6 Diagnostic category: < or = 9 kPa: in the absence of other known clinical signs, rules out cACLD Electronically Signed   By: Delanna Ahmadi M.D.   On: 06/20/2021 12:29     ASSESSMENT:  1.  Stage IIIa (PT1CPN2) right lung adenocarcinoma: -PET scan on 05/13/2019 showed right lower lobe nodule concerning for bronchogenic carcinoma.  Cirrhotic liver. -Right lower lobectomy and lymph node excision on 06/19/2019. -Pathology showed 1.7 cm right lower lobe adenocarcinoma, unifocal, lymphovascular invasion present.  Margins negative.  2/14 lymph nodes positive (level 7 and 10R).  PT1CPN2. -PD-L1 30%, K-ras G12A, MSI stable.  EGFR mutation not identified. -4 cycles of adjuvant carboplatin and pemetrexed from 09/09/2019 through 11/11/2019. -CT chest on 12/03/2019 shows small volume partially loculated right-sided pleural fluid inferiorly and medially.  Minimal increased density within the pleural fluid related to complexity.  Right paratracheal lymph node which is not pathologic by size criteria.  Similar left lower lobe 5 mm  pulmonary nodule nonspecific.  Cirrhosis and portal venous hypertension.   PLAN:  1.  Stage III right lung adenocarcinoma: - She denies any recent infections. - Reviewed CT chest from 06/06/2021: Stable postoperative changes of prior right lower lobectomy with no evidence of recurrence or metastatic disease.  Small right middle lobe lung nodule is stable.  Other none malignant findings were discussed. - Reviewed labs which showed normal LFTs and CBC. - Recommend RTC 6 months with repeat scan and labs.   2.  Immune mediated thrombocytopenia: - She was previously treated for hep C infection and also has cirrhosis. - Platelet count is stable in the normal range at 153.   3.  Diffuse body pains and back pain: -  Continue tramadol as needed.   Orders placed this encounter:  No orders of the defined types were placed in this encounter.    Derek Jack, MD [place] [phone]   I, Thana Ates, am acting as a scribe for Dr. Derek Jack.  I, Derek Jack MD, have reviewed the above documentation for accuracy and completeness, and I agree with the above.

## 2021-07-09 ENCOUNTER — Other Ambulatory Visit: Payer: Self-pay | Admitting: Nurse Practitioner

## 2021-08-07 IMAGING — PT NM PET TUM IMG INITIAL (PI) SKULL BASE T - THIGH
10 series · 24 of 25 positions shown · non-contrast
Comparison: CT 05/01/2019, 06/05/2018

CLINICAL DATA: Initial treatment strategy for pulmonary nodule.

EXAM:
NUCLEAR MEDICINE PET SKULL BASE TO THIGH
TECHNIQUE: 9.2 mCi F-18 FDG was injected intravenously. Full-ring PET imaging
was performed from the skull base to thigh after the radiotracer. CT
data was obtained and used for attenuation correction and anatomic
localization.
Fasting blood glucose: 79 mg/dl

[Series 3: ct wb 5.0 b30f · axial · 5.0mm · 0.98mm/px · z∈[-1010,-144]mm · 3 of 290 slices shown]
[im 1/290]
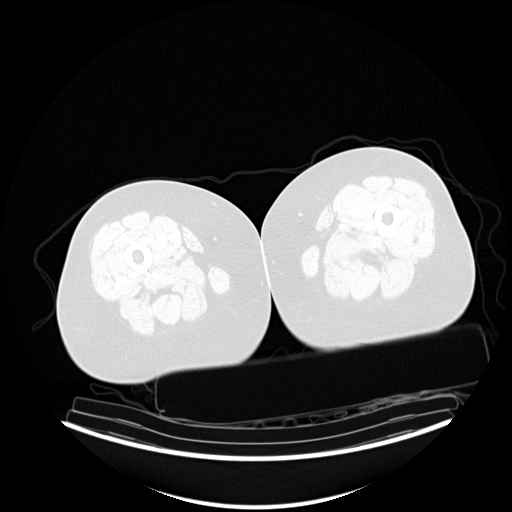
[im 145/290]
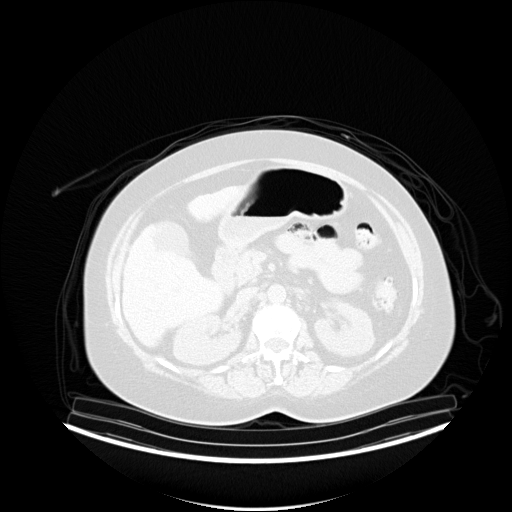
[im 290/290  brain]
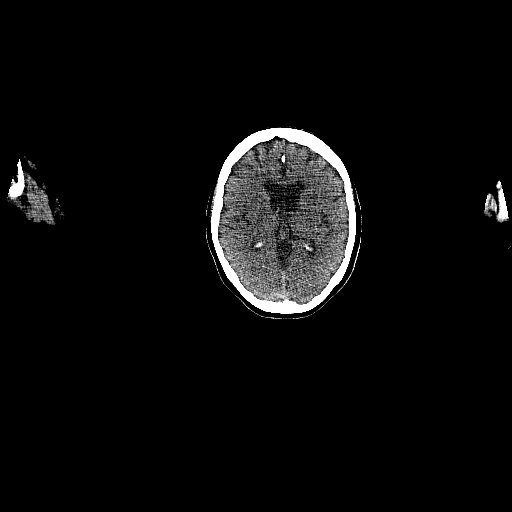

[Series 5: pet wb uncorrected (nac) · axial · 5.0mm · 4.07mm/px · z∈[-1010,-144]mm · 3 of 290 slices shown]
[im 1/290]
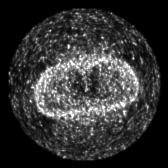
[im 145/290]
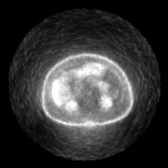
[im 290/290]
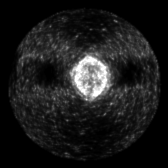

[Series 6: pet wb (ac) · axial · 5.0mm · 2.72mm/px · z∈[-1010,-144]mm · 4 of 290 slices shown]
[im 1/290]
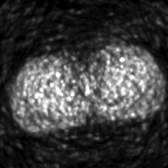
[im 97/290]
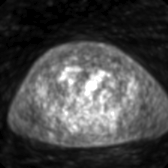
[im 193/290]
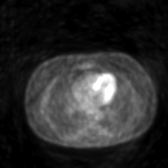
[im 290/290]
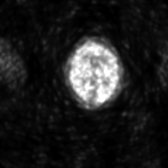

[Series 603: pet axial fused · 3 of 288 slices shown]
[im 1/288]
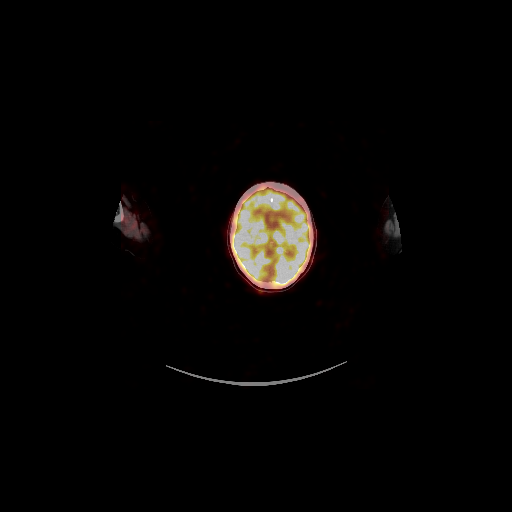
[im 96/288]
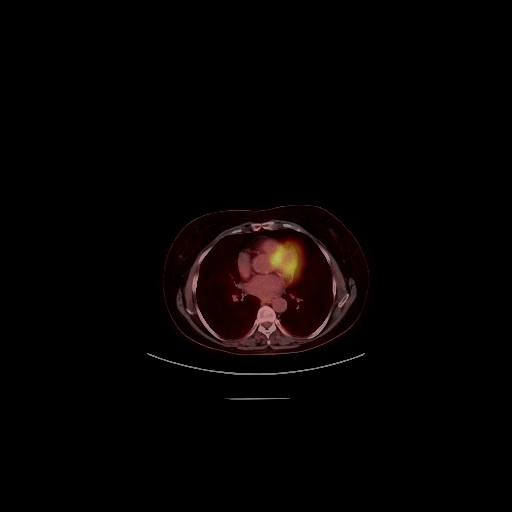
[im 288/288]
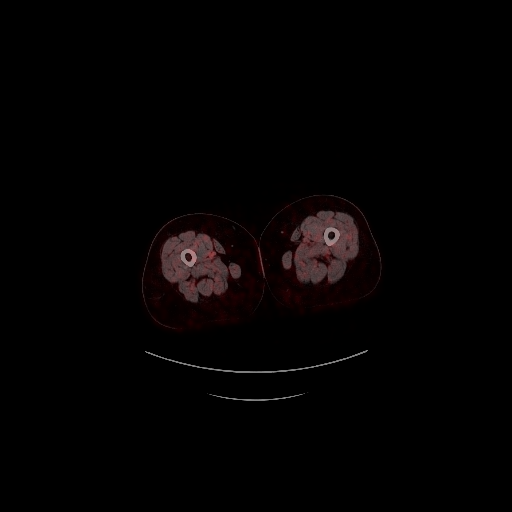

[Series 604: pet coronal fused · 1 of 95 slices shown]
[im 1/95]
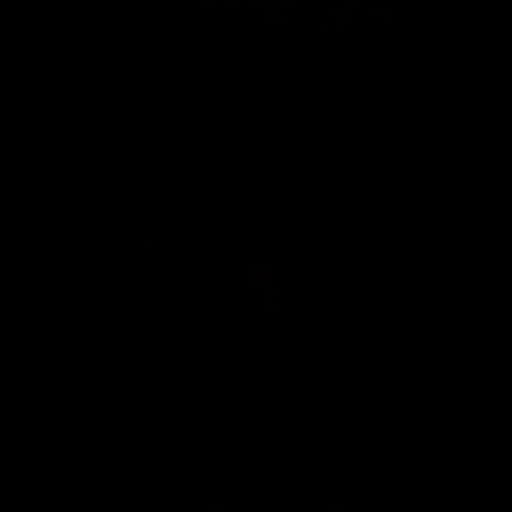

[Series 605: pet sagittal fused · 2 of 163 slices shown]
[im 1/163]
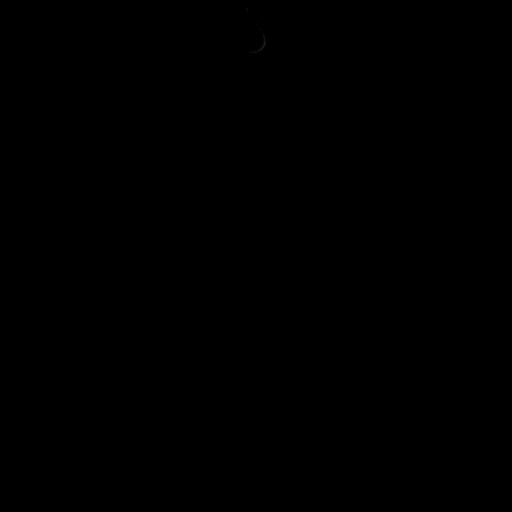
[im 163/163]
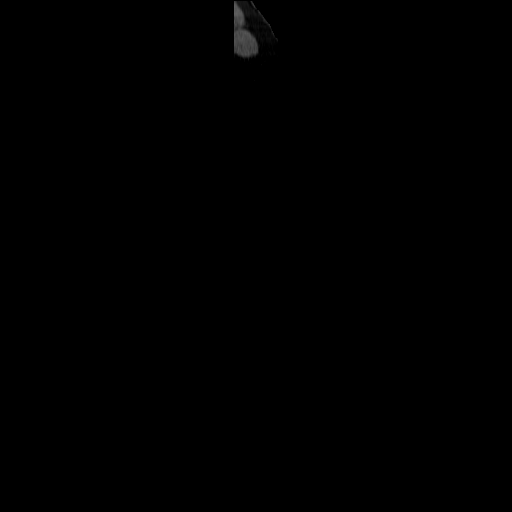

[Series 606: pet axial · 4 of 290 slices shown]
[im 1/290]
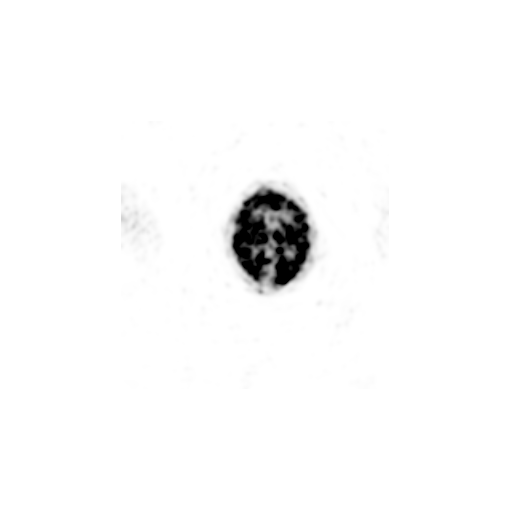
[im 97/290]
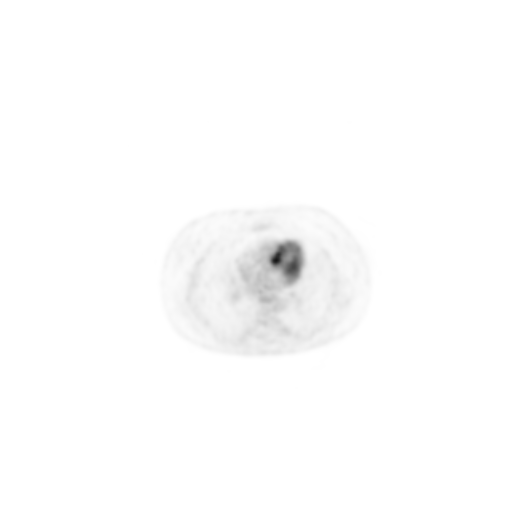
[im 193/290]
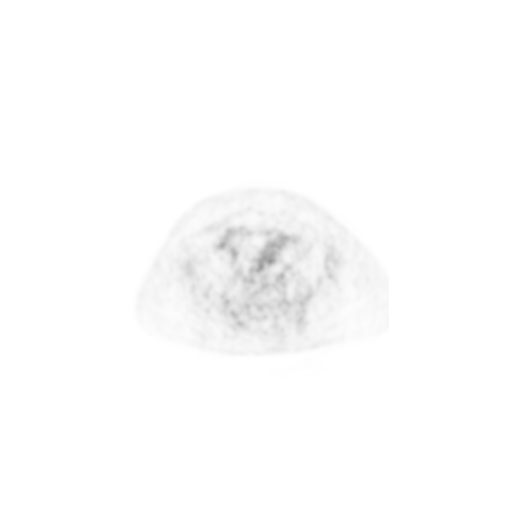
[im 290/290]
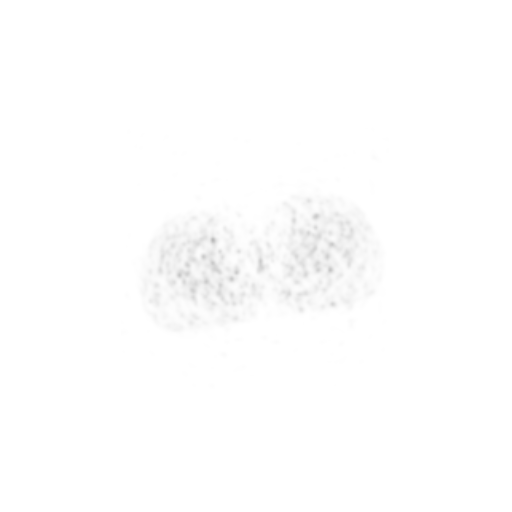

[Series 607: pet coronal · 1 of 100 slices shown]
[im 1/100]
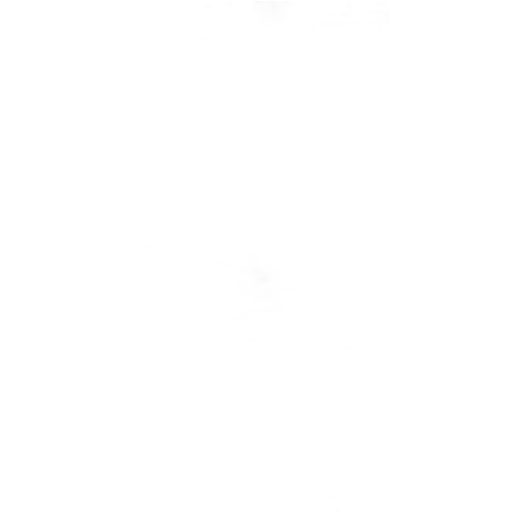

[Series 608: pet sagittal · 2 of 151 slices shown]
[im 1/151]
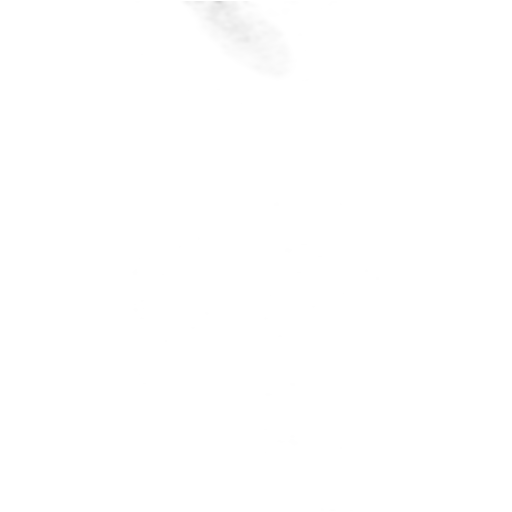
[im 151/151]
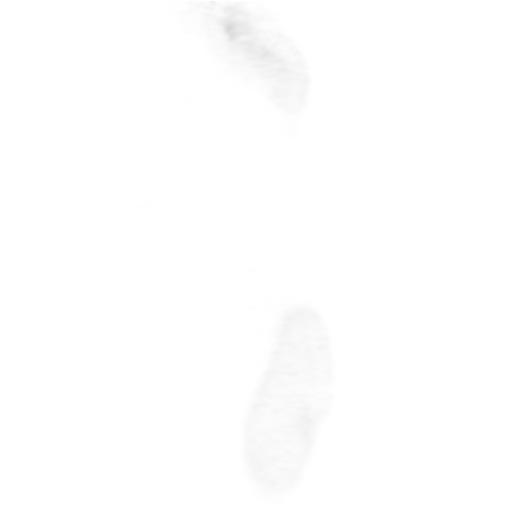

[Series 1113: results mm oncology reading · 1.0mm · 0.89mm/px · 1 of 2 slices shown]
[im 1/2]
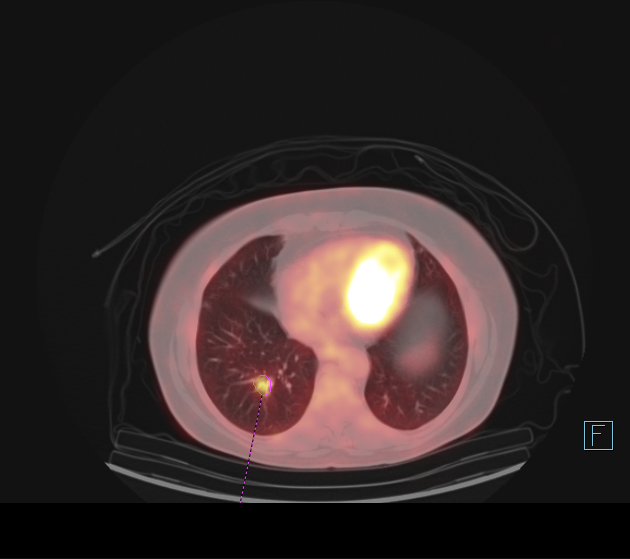

[24 of 25 positions shown; findings below may reference images not displayed]

FINDINGS: Mediastinal blood pool activity: SUV max

Liver activity: SUV max NA

NECK: No hypermetabolic lymph nodes in the neck.

Incidental CT findings: none

CHEST: 13 mm nodule in the RIGHT lower lobe with associated
metabolic activity with SUV max equal 4.0 (image 111/3). This
corresponds to the enlarging nodule on comparison chest CT.

Smaller nodule in the LEFT lower lobe measuring 6 mm (image 90/3)
does not have associated metabolic activity but is below the size
limit for accurate characterization

Probable inter fissural lymph node in the RIGHT middle lobe (image
93/3) has no metabolic activity.

No hypermetabolic mediastinal lymph nodes or enlarged mediastinal.

Incidental CT findings: none

ABDOMEN/PELVIS: No abnormal hypermetabolic activity within the
liver, pancreas, adrenal glands, or spleen. No hypermetabolic lymph
nodes in the abdomen or pelvis.

Incidental CT findings: Nodular contour of the liver. Uterus normal.
Atherosclerotic calcification of the aorta.

SKELETON: No focal hypermetabolic activity to suggest skeletal
metastasis.

Incidental CT findings: none
IMPRESSION: 1. Enlarging hypermetabolic RIGHT lobe nodule is concerning for
bronchogenic carcinoma. No hypermetabolic mediastinal lymph nodes.
Favor stage T1a N0 M0.
2. A 6 mm LEFT lobe pulmonary nodule too small to characterize by
FDG PET scan. Cannot exclude synchronous bronchogenic carcinoma.
Recommend follow-up CT scan 6 months.
3. Cirrhotic liver.

## 2021-08-16 ENCOUNTER — Other Ambulatory Visit (HOSPITAL_COMMUNITY): Payer: Self-pay | Admitting: Nurse Practitioner

## 2021-08-22 ENCOUNTER — Other Ambulatory Visit (HOSPITAL_COMMUNITY): Payer: Self-pay | Admitting: *Deleted

## 2021-08-22 MED ORDER — SYMBICORT 160-4.5 MCG/ACT IN AERO: 2.0000 | INHALATION_SPRAY | Freq: Two times a day (BID) | RESPIRATORY_TRACT | 6 refills | Status: DC

## 2021-10-05 ENCOUNTER — Inpatient Hospital Stay: Payer: Medicaid Other | Attending: Hematology

## 2021-10-05 VITALS — BP 116/67 | HR 89 | Temp 98.4°F | Resp 20

## 2021-10-05 DIAGNOSIS — Z85118 Personal history of other malignant neoplasm of bronchus and lung: Secondary | ICD-10-CM | POA: Insufficient documentation

## 2021-10-05 DIAGNOSIS — D693 Immune thrombocytopenic purpura: Secondary | ICD-10-CM

## 2021-10-05 DIAGNOSIS — Z452 Encounter for adjustment and management of vascular access device: Secondary | ICD-10-CM | POA: Insufficient documentation

## 2021-10-05 DIAGNOSIS — C3491 Malignant neoplasm of unspecified part of right bronchus or lung: Secondary | ICD-10-CM

## 2021-10-05 LAB — COMPREHENSIVE METABOLIC PANEL
ALT: 24 U/L (ref 0–44)
AST: 30 U/L (ref 15–41)
Albumin: 4.1 g/dL (ref 3.5–5.0)
Alkaline Phosphatase: 53 U/L (ref 38–126)
Anion gap: 10 (ref 5–15)
BUN: 5 mg/dL — ABNORMAL LOW (ref 6–20)
CO2: 24 mmol/L (ref 22–32)
Calcium: 8.8 mg/dL — ABNORMAL LOW (ref 8.9–10.3)
Chloride: 102 mmol/L (ref 98–111)
Creatinine, Ser: 0.56 mg/dL (ref 0.44–1.00)
GFR, Estimated: >60 mL/min (ref >60)
Glucose, Bld: 102 mg/dL — ABNORMAL HIGH (ref 70–99)
Potassium: 3.5 mmol/L (ref 3.5–5.1)
Sodium: 136 mmol/L (ref 135–145)
Total Bilirubin: 0.8 mg/dL (ref 0.3–1.2)
Total Protein: 7.1 g/dL (ref 6.5–8.1)

## 2021-10-05 LAB — CBC WITH DIFFERENTIAL/PLATELET
Abs Immature Granulocytes: 0.05 10*3/uL (ref 0.00–0.07)
Basophils Absolute: 0 10*3/uL (ref 0.0–0.1)
Basophils Relative: 0 %
Eosinophils Absolute: 0.1 10*3/uL (ref 0.0–0.5)
Eosinophils Relative: 1 %
HCT: 43.8 % (ref 36.0–46.0)
Hemoglobin: 15 g/dL (ref 12.0–15.0)
Immature Granulocytes: 1 %
Lymphocytes Relative: 28 %
Lymphs Abs: 2.8 10*3/uL (ref 0.7–4.0)
MCH: 32.6 pg (ref 26.0–34.0)
MCHC: 34.2 g/dL (ref 30.0–36.0)
MCV: 95.2 fL (ref 80.0–100.0)
Monocytes Absolute: 0.6 10*3/uL (ref 0.1–1.0)
Monocytes Relative: 6 %
Neutro Abs: 6.5 10*3/uL (ref 1.7–7.7)
Neutrophils Relative %: 64 %
Platelets: 193 10*3/uL (ref 150–400)
RBC: 4.6 MIL/uL (ref 3.87–5.11)
RDW: 13.1 % (ref 11.5–15.5)
WBC: 10 10*3/uL (ref 4.0–10.5)
nRBC: 0 % (ref 0.0–0.2)

## 2021-10-05 MED ORDER — HEPARIN SOD (PORK) LOCK FLUSH 100 UNIT/ML IV SOLN
500.0000 [IU] | Freq: Once | INTRAVENOUS | Status: AC
  Administered 2021-10-05: 500 [IU] via INTRAVENOUS

## 2021-10-05 MED ORDER — SODIUM CHLORIDE 0.9% FLUSH
10.0000 mL | Freq: Once | INTRAVENOUS | Status: AC
  Administered 2021-10-05: 10 mL via INTRAVENOUS

## 2021-10-05 NOTE — Progress Notes (Signed)
Patients port flushed without difficulty.  Good blood return noted with no bruising or swelling noted at site.  Band aid applied.  VSS with discharge and left in satisfactory condition with no s/s of distress noted.   

## 2021-11-29 ENCOUNTER — Telehealth: Payer: Self-pay | Admitting: *Deleted

## 2021-11-29 NOTE — Telephone Encounter (Signed)
Recall sent 

## 2021-11-29 NOTE — Telephone Encounter (Signed)
Patient on recall for 6 mth US 

## 2021-12-29 ENCOUNTER — Inpatient Hospital Stay: Payer: Medicaid Other | Attending: Hematology

## 2021-12-29 ENCOUNTER — Ambulatory Visit (HOSPITAL_COMMUNITY)
Admission: RE | Admit: 2021-12-29 | Discharge: 2021-12-29 | Disposition: A | Discharge status: Home / Self Care | Payer: Medicaid Other | Source: Ambulatory Visit | Attending: Hematology | Admitting: Hematology

## 2021-12-29 ENCOUNTER — Other Ambulatory Visit (HOSPITAL_COMMUNITY): Payer: Medicaid Other

## 2021-12-29 DIAGNOSIS — C3491 Malignant neoplasm of unspecified part of right bronchus or lung: Secondary | ICD-10-CM

## 2021-12-29 DIAGNOSIS — Z85118 Personal history of other malignant neoplasm of bronchus and lung: Secondary | ICD-10-CM | POA: Diagnosis present

## 2021-12-29 DIAGNOSIS — D693 Immune thrombocytopenic purpura: Secondary | ICD-10-CM

## 2021-12-29 LAB — COMPREHENSIVE METABOLIC PANEL
ALT: 13 U/L (ref 0–44)
AST: 21 U/L (ref 15–41)
Albumin: 4.1 g/dL (ref 3.5–5.0)
Alkaline Phosphatase: 50 U/L (ref 38–126)
Anion gap: 11 (ref 5–15)
BUN: 8 mg/dL (ref 6–20)
CO2: 26 mmol/L (ref 22–32)
Calcium: 9.3 mg/dL (ref 8.9–10.3)
Chloride: 100 mmol/L (ref 98–111)
Creatinine, Ser: 0.56 mg/dL (ref 0.44–1.00)
GFR, Estimated: >60 mL/min (ref >60)
Glucose, Bld: 109 mg/dL — ABNORMAL HIGH (ref 70–99)
Potassium: 3.5 mmol/L (ref 3.5–5.1)
Sodium: 137 mmol/L (ref 135–145)
Total Bilirubin: 0.8 mg/dL (ref 0.3–1.2)
Total Protein: 7.5 g/dL (ref 6.5–8.1)

## 2021-12-29 LAB — CBC WITH DIFFERENTIAL/PLATELET
Abs Immature Granulocytes: 0.03 10*3/uL (ref 0.00–0.07)
Basophils Absolute: 0 10*3/uL (ref 0.0–0.1)
Basophils Relative: 0 %
Eosinophils Absolute: 0.1 10*3/uL (ref 0.0–0.5)
Eosinophils Relative: 1 %
HCT: 43.9 % (ref 36.0–46.0)
Hemoglobin: 14.5 g/dL (ref 12.0–15.0)
Immature Granulocytes: 0 %
Lymphocytes Relative: 22 %
Lymphs Abs: 2.2 10*3/uL (ref 0.7–4.0)
MCH: 31.7 pg (ref 26.0–34.0)
MCHC: 33 g/dL (ref 30.0–36.0)
MCV: 95.9 fL (ref 80.0–100.0)
Monocytes Absolute: 0.7 10*3/uL (ref 0.1–1.0)
Monocytes Relative: 7 %
Neutro Abs: 6.8 10*3/uL (ref 1.7–7.7)
Neutrophils Relative %: 70 %
Platelets: 211 10*3/uL (ref 150–400)
RBC: 4.58 MIL/uL (ref 3.87–5.11)
RDW: 12.5 % (ref 11.5–15.5)
WBC: 9.8 10*3/uL (ref 4.0–10.5)
nRBC: 0 % (ref 0.0–0.2)

## 2021-12-29 MED ORDER — HEPARIN SOD (PORK) LOCK FLUSH 100 UNIT/ML IV SOLN
INTRAVENOUS | Status: AC
  Administered 2021-12-29: 500 [IU]
  Filled 2021-12-29: qty 5

## 2021-12-29 MED ORDER — IOHEXOL 300 MG/ML  SOLN
75.0000 mL | Freq: Once | INTRAMUSCULAR | Status: AC | PRN
  Administered 2021-12-29: 75 mL via INTRAVENOUS

## 2021-12-29 NOTE — Progress Notes (Signed)
Rachel Gross presented for Portacath access and flush. Portacath located left chest wall accessed with  H 20 needle. Good blood return present. Portacath flushed with 56ml NS and 500U/44ml Heparin and needle removed intact. Procedure without incident. Patient tolerated procedure well.  Labs done per orders.  Vitals stable and discharged home from clinic ambulatory. Follow up as scheduled.

## 2021-12-29 NOTE — Patient Instructions (Signed)
[facility]  Discharge Instructions: Thank you for choosing [facility] to provide your oncology and hematology care.  If you have a lab appointment with the [facility], please come in thru the Main Entrance and check in at the main information desk.  Wear comfortable clothing and clothing appropriate for easy access to any Portacath or PICC line.   We strive to give you quality time with your provider. You may need to reschedule your appointment if you arrive late (15 or more minutes).  Arriving late affects you and other patients whose appointments are after yours.  Also, if you miss three or more appointments without notifying the office, you may be dismissed from the clinic at the provider's discretion.      For prescription refill requests, have your pharmacy contact our office and allow 72 hours for refills to be completed.    Today you had your port flushed and labs drawn per orders.   To help prevent nausea and vomiting after your treatment, we encourage you to take your nausea medication as directed.  BELOW ARE SYMPTOMS THAT SHOULD BE REPORTED IMMEDIATELY: *FEVER GREATER THAN 100.4 F (38 C) OR HIGHER *CHILLS OR SWEATING *NAUSEA AND VOMITING THAT IS NOT CONTROLLED WITH YOUR NAUSEA MEDICATION *UNUSUAL SHORTNESS OF BREATH *UNUSUAL BRUISING OR BLEEDING *URINARY PROBLEMS (pain or burning when urinating, or frequent urination) *BOWEL PROBLEMS (unusual diarrhea, constipation, pain near the anus) TENDERNESS IN MOUTH AND THROAT WITH OR WITHOUT PRESENCE OF ULCERS (sore throat, sores in mouth, or a toothache) UNUSUAL RASH, SWELLING OR PAIN  UNUSUAL VAGINAL DISCHARGE OR ITCHING   Items with * indicate a potential emergency and should be followed up as soon as possible or go to the Emergency Department if any problems should occur.  Please show the CHEMOTHERAPY ALERT CARD or IMMUNOTHERAPY ALERT CARD at check-in to the Emergency Department and triage  nurse.  Should you have questions after your visit or need to cancel or reschedule your appointment, please contact [facility] [phone]  and follow the prompts.  Office hours are 8:00 a.m. to 4:30 p.m. Monday - Friday. Please note that voicemails left after 4:00 p.m. may not be returned until the following business day.  We are closed weekends and major holidays. You have access to a nurse at all times for urgent questions. Please call the main number to the clinic [phone] and follow the prompts.  For any non-urgent questions, you may also contact your provider using MyChart. We now offer e-Visits for anyone 29 and older to request care online for non-urgent symptoms. For details visit [email or web address].   Also download the MyChart app! Go to the app store, search "MyChart", open the app, select [facility], and log in with your MyChart username and password.  Masks are optional in the cancer centers. If you would like for your care team to wear a mask while they are taking care of you, please let them know. You may have one support person who is at least 58 years old accompany you for your appointments.

## 2021-12-30 NOTE — Progress Notes (Signed)
Rachel Gross with [facility] called with impression 2. Interval enlargement of a cavitary nodule of the dependent right upper lobe consistent with an enlarging metastasis. 3. 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 4. Numerous new clustered nodules of varying sizes, generally subsolid in appearance throughout the right upper lobe. Although morphologically these are most suggestive of nonspecific infection or inflammation, any new nodules are inherently suspicious for additional metastases given other unambiguous evidence of metastatic disease above on today's examination. From the report on patients CT scan of Chest. Printed results and given to Dr. Delton Coombes for review.

## 2021-12-31 LAB — AFP TUMOR MARKER: AFP, Serum, Tumor Marker: 2.3 ng/mL (ref 0.0–9.2)

## 2022-01-05 ENCOUNTER — Inpatient Hospital Stay: Payer: Medicaid Other | Attending: Hematology | Admitting: Hematology

## 2022-01-05 VITALS — BP 126/73 | HR 82 | Temp 98.5°F | Resp 18 | Ht 61.0 in | Wt 158.7 lb

## 2022-01-05 DIAGNOSIS — Z85118 Personal history of other malignant neoplasm of bronchus and lung: Secondary | ICD-10-CM | POA: Insufficient documentation

## 2022-01-05 DIAGNOSIS — D696 Thrombocytopenia, unspecified: Secondary | ICD-10-CM | POA: Diagnosis not present

## 2022-01-05 DIAGNOSIS — Z08 Encounter for follow-up examination after completed treatment for malignant neoplasm: Secondary | ICD-10-CM | POA: Diagnosis present

## 2022-01-05 DIAGNOSIS — R0781 Pleurodynia: Secondary | ICD-10-CM | POA: Diagnosis not present

## 2022-01-05 DIAGNOSIS — C3491 Malignant neoplasm of unspecified part of right bronchus or lung: Secondary | ICD-10-CM | POA: Diagnosis not present

## 2022-01-05 MED ORDER — ALPRAZOLAM 1 MG PO TABS: 1.0000 mg | ORAL_TABLET | Freq: Once | ORAL | 0 refills | Status: AC

## 2022-01-05 NOTE — Patient Instructions (Addendum)
[facility]  Discharge Instructions  You were seen and examined today by Dr. Delton Coombes.  Dr. Delton Coombes discussed your most recent lab work and CT scan which revealed that you have multiple new lung nodules.  Dr. Delton Coombes has recommended a PET scan.  Follow-up as scheduled after scan.    Thank you for choosing [facility] to provide your oncology and hematology care.   To afford each patient quality time with our provider, please arrive at least 15 minutes before your scheduled appointment time. You may need to reschedule your appointment if you arrive late (10 or more minutes). Arriving late affects you and other patients whose appointments are after yours.  Also, if you miss three or more appointments without notifying the office, you may be dismissed from the clinic at the provider's discretion.    Again, thank you for choosing [facility].  Our hope is that these requests will decrease the amount of time that you wait before being seen by our physicians.   If you have a lab appointment with the [facility] please come in thru the Main Entrance and check in at the main information desk.           _____________________________________________________________  Should you have questions after your visit to [facility], please contact our office at [phone] and follow the prompts.  Our office hours are 8:00 a.m. to 4:30 p.m. Monday - Thursday and 8:00 a.m. to 2:30 p.m. Friday.  Please note that voicemails left after 4:00 p.m. may not be returned until the following business day.  We are closed weekends and all major holidays.  You do have access to a nurse 24-7, just call the main number to the clinic [phone] and do not press any options, hold on the line and a nurse will answer the phone.    For prescription refill requests, have your pharmacy contact our office and allow 72 hours.    Masks  are optional in the cancer centers. If you would like for your care team to wear a mask while they are taking care of you, please let them know. You may have one support person who is at least 58 years old accompany you for your appointments.

## 2022-01-05 NOTE — Progress Notes (Signed)
[facility] [place] [address] [place] [address]   CLINIC:  Medical Oncology/Hematology  PCP:  Celene Squibb, MD [address] [name] [address] [phone]   REASON FOR VISIT:  Follow-up for stage III right lung cancer  PRIOR THERAPY:  1. Right lower lobectomy on 06/19/2019. 2. Carboplatin and pemetrexed x 4 cycles from 09/09/2019 to 11/11/2019.  NGS Results: PD-L1 30%, Foundation 1 MS--stable, TMB 9 Muts/Mb  CURRENT THERAPY: surveillance  BRIEF ONCOLOGIC HISTORY:  Oncology History  Adenocarcinoma of lung, stage 3, right ([place])  07/02/2019 Initial Diagnosis   Adenocarcinoma of lung, stage 3, right ([place])   07/18/2019 Genetic Testing   Foundation One     07/23/2019 Genetic Testing   PD-L1     09/09/2019 - 11/11/2019 Chemotherapy   Patient is on Treatment Plan : LUNG NSCLC Pemetrexed ([place]) / Carboplatin q21d x 4 cycles       CANCER STAGING:  Cancer Staging  No matching staging information was found for the patient.  INTERVAL HISTORY:  [name], a 56 y.o. female, seen for follow-up of right lung cancer.  She reports pain on left lateral ribs when she uses her left arm for the past couple of weeks.  She also reported decreased energy levels for the past 1 week.  She is continuing to smoke 2 to 3 cigarettes/day.  She is taking care of her father who is in hospice and dying.  REVIEW OF SYSTEMS:  Review of Systems  Constitutional:  Negative for appetite change and fatigue.  HENT:   Negative for nosebleeds.   Respiratory:  Positive for shortness of breath. Negative for hemoptysis.   Gastrointestinal:  Positive for constipation. Negative for blood in stool.  Genitourinary:  Negative for hematuria.   Psychiatric/Behavioral:  Positive for depression. The patient is nervous/anxious.   All other systems reviewed and are negative.   PAST MEDICAL/SURGICAL HISTORY:  Past Medical History:  Diagnosis Date   Alcoholic hepatitis without  ascites    Anxiety    Arthritis    knees, hands   Atypical squamous cell of undetermined significance of cervix    Bipolar disorder ([place])    Cancer (HCC)    right lung   COPD (chronic obstructive pulmonary disease) (HCC)    Depression    Dyspnea    Emphysema lung (HCC)    GERD (gastroesophageal reflux disease)    Hepatitis C    s/p treatment with Epclusa   History of HPV infection    History of pneumonia    Pneumonia    Smoker    Past Surgical History:  Procedure Laterality Date   BIOPSY  05/02/2018   Procedure: BIOPSY;  Surgeon: Daneil Dolin, MD;  [facility];  Service: Endoscopy;;  gastric   CESAREAN SECTION     CHEST TUBE INSERTION Right 06/26/2019   Procedure: Right CHEST TUBE REPLACEMENT;  Surgeon: [name], MD;  Location: [facility];  Service: Thoracic;  Laterality: Right;   COLONOSCOPY WITH PROPOFOL N/A 03/03/2019   Procedure: COLONOSCOPY WITH PROPOFOL;  Surgeon: Daneil Dolin, MD;  [facility];  Service: Endoscopy;  Laterality: N/A;  9:15am   ESOPHAGOGASTRODUODENOSCOPY (EGD) WITH PROPOFOL N/A 05/02/2018   Procedure: ESOPHAGOGASTRODUODENOSCOPY (EGD) WITH PROPOFOL;  Surgeon: Daneil Dolin, MD;  [facility];  Service: Endoscopy;  Laterality: N/A;  8:30am   EYE SURGERY Bilateral    cataract   HEMOSTASIS CLIP PLACEMENT  03/03/2019  Procedure: HEMOSTASIS CLIP PLACEMENT;  Surgeon: Daneil Dolin, MD;  [facility];  Service: Endoscopy;;   INTERCOSTAL NERVE BLOCK Right 06/19/2019   Procedure: Intercostal Nerve Block;  Surgeon: [name], MD;  Location: [facility];  Service: Thoracic;  Laterality: Right;   IR US GUIDE VASC ACCESS RIGHT  03/13/2018   LOBECTOMY     LYMPH NODE DISSECTION Right 06/19/2019   Procedure: Lymph Node Dissection;  Surgeon: [name], MD;  Location: [facility];  Service: Thoracic;  Laterality: Right;   POLYPECTOMY  03/03/2019   Procedure: POLYPECTOMY;  Surgeon: Daneil Dolin, MD;   [facility];  Service: Endoscopy;;   PORTACATH PLACEMENT Left 09/08/2019   Procedure: INSERTION PORT-A-CATH LEFT CHEST (attached catheter in left subclavian);  Surgeon: Aviva Signs, MD;  Location[facility];  Service: General;  Laterality: Left;   TONSILLECTOMY     TOOTH EXTRACTION  01/31/2018   x 7   VIDEO BRONCHOSCOPY WITH INSERTION OF INTERBRONCHIAL VALVE (IBV) N/A 06/26/2019   Procedure: VIDEO BRONCHOSCOPY WITH INSERTION OF TWO INTERBRONCHIAL VALVE (IBV);  Surgeon: [name], MD;  Location: [facility];  Service: Thoracic;  Laterality: N/A;   VIDEO BRONCHOSCOPY WITH INSERTION OF INTERBRONCHIAL VALVE (IBV) N/A 08/08/2019   Procedure: VIDEO BRONCHOSCOPY WITH REMOVAL OF INTERBRONCHIAL VALVE (IBV);  Surgeon: [name], MD;  Location: [facility];  Service: Thoracic;  Laterality: N/A;    SOCIAL HISTORY:  Social History   Socioeconomic History   Marital status: Single    Spouse name: Not on file   Number of children: Not on file   Years of education: Not on file   Highest education level: Not on file  Occupational History   Not on file  Tobacco Use   Smoking status: Former    Packs/day: 0.50    Years: 38.00    Total pack years: 19.00    Types: Cigarettes    Quit date: 06/19/2019    Years since quitting: 2.5   Smokeless tobacco: Never  Vaping Use   Vaping Use: Never used  Substance and Sexual Activity   Alcohol use: Not Currently    Comment: Last use of alcohol 03/2016   Drug use: Not Currently    Types: 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   Sexual activity: Not Currently  Other Topics Concern   Not on file  Social History Narrative   Not on file   Social Determinants of Health   Financial Resource Strain: Medium Risk (12/10/2019)   Overall Financial Resource Strain (CARDIA)    Difficulty of Paying Living Expenses: Somewhat hard  Food Insecurity: Food Insecurity Present (12/10/2019)   Hunger Vital Sign    Worried About Running Out of Food in the Last Year:  Often true    Ran Out of Food in the Last Year: Often true  Transportation Needs: No Transportation Needs (12/10/2019)   PRAPARE - Hydrologist (Medical): No    Lack of Transportation (Non-Medical): No  Physical Activity: Sufficiently Active (12/10/2019)   Exercise Vital Sign    Days of Exercise per Week: 3 days    Minutes of Exercise per Session: 150+ min  Stress: Stress Concern Present (12/10/2019)   [place]    Feeling of Stress : Very much  Social Connections: Socially Isolated (12/10/2019)   Social Connection and Isolation Panel [NHANES]    Frequency of Communication with Friends and Family: More than  three times a week    Frequency of Social Gatherings with Friends and Family: Once a week    Attends Religious Services: Never    Marine scientist or Organizations: No    Attends Archivist Meetings: Never    Marital Status: Divorced  Human resources officer Violence: Not At Risk (12/10/2019)   Humiliation, Afraid, Rape, and Kick questionnaire    Fear of Current or Ex-Partner: No    Emotionally Abused: No    Physically Abused: No    Sexually Abused: No    FAMILY HISTORY:  Family History  Problem Relation Age of Onset   Congenital heart disease Mother    Bipolar disorder Mother    Prostate cancer Brother    Alzheimer's disease Paternal Grandmother    Colon cancer Neg Hx     CURRENT MEDICATIONS:  Current Outpatient Medications  Medication Sig Dispense Refill   albuterol (VENTOLIN HFA) 108 (90 Base) MCG/ACT inhaler Inhale 2 puffs into the lungs every 4 (four) hours as needed for wheezing or shortness of breath.     ALPRAZolam (XANAX) 1 MG tablet Take 1 tablet (1 mg total) by mouth once for 1 dose. Take 1 tablet 30 mins prior to exam 1 tablet 0   dexamethasone (DECADRON) 6 MG tablet      diphenhydrAMINE (BENADRYL) 25 MG tablet Take 25 mg by mouth at bedtime.      divalproex (DEPAKOTE) 250 MG DR tablet Take 250 mg by mouth at bedtime.      folic acid (FOLVITE) 1 MG tablet Take 1 mg by mouth daily.     levofloxacin (LEVAQUIN) 500 MG tablet Take 1 tablet (500 mg total) by mouth daily. 10 tablet 0   lidocaine-prilocaine (EMLA) cream Apply a small amount to port a cath site and cover with plastic wrap 1 hour prior to chemotherapy appointments 30 g 3   ondansetron (ZOFRAN ODT) 8 MG disintegrating tablet Place 1 tablet under tongue every 8 hours as needed for nausea. You may begin using this medication on the third day after each chemotherapy treatment. 20 tablet 1   pantoprazole (PROTONIX) 40 MG tablet TAKE 1 TABLET BY MOUTH DAILY BEFORE AND BREAKFAST 90 tablet 1   risperiDONE (RISPERDAL) 2 MG tablet Take 2 mg by mouth at bedtime.      scopolamine (TRANSDERM-SCOP) 1 MG/3DAYS Place 1 patch (1.5 mg total) onto the skin every 3 (three) days. 10 patch 12   SYMBICORT 160-4.5 MCG/ACT inhaler Inhale 2 puffs into the lungs 2 (two) times daily. 1 each 6   traMADol (ULTRAM) 50 MG tablet Take 1 tablet (50 mg total) by mouth daily as needed. 30 tablet 0   traZODone (DESYREL) 100 MG tablet Take by mouth.     No current facility-administered medications for this visit.    ALLERGIES:  Allergies  Allergen Reactions   Folic Acid Anaphylaxis    Not entirely clear if it is from folic acid. She is taking folic acid now and doesn't have the throat swelling anymore   Penicillins Anaphylaxis    Throat swelled Did it involve swelling of the face/tongue/throat, SOB, or low BP? Yes Did it involve sudden or severe rash/hives, skin peeling, or any reaction on the inside of your mouth or nose? No Did you need to seek medical attention at a hospital or doctor's office? No When did it last happen?      childhood allergy If all above answers are "NO", may proceed with cephalosporin use.  Amoxicillin     hallucinations Did it involve swelling of the face/tongue/throat, SOB, or low  BP? No Did it involve sudden or severe rash/hives, skin peeling, or any reaction on the inside of your mouth or nose? No Did you need to seek medical attention at a hospital or doctor's office? Yes When did it last happen?      July 2019 If all above answers are "NO", may proceed with cephalosporin use.    Fish Allergy Nausea Only   Other Swelling    Patient states that the sunrise brand folic acid made her feel like her throat was swelling.     PHYSICAL EXAM:  Performance status (ECOG): 1 - Symptomatic but completely ambulatory  Vitals:   01/05/22 1545  BP: 126/73  Pulse: 82  Resp: 18  Temp: 98.5 F (36.9 C)  SpO2: 98%   Wt Readings from Last 3 Encounters:  01/05/22 158 lb 11.2 oz (72 kg)  07/05/21 168 lb 4.8 oz (76.3 kg)  04/26/21 171 lb (77.6 kg)   Physical Exam Vitals reviewed.  Constitutional:      Appearance: Normal appearance. She is obese.  Cardiovascular:     Rate and Rhythm: Normal rate and regular rhythm.     Pulses: Normal pulses.     Heart sounds: Normal heart sounds.  Pulmonary:     Effort: Pulmonary effort is normal.     Breath sounds: Normal breath sounds.  Neurological:     General: No focal deficit present.     Mental Status: She is alert and oriented to person, place, and time.  Psychiatric:        Mood and Affect: Mood normal.        Behavior: Behavior normal.      LABORATORY DATA:  I have reviewed the labs as listed.     Latest Ref Rng & Units 12/29/2021    1:11 PM 10/05/2021   11:46 AM 06/06/2021    2:26 PM  CBC  WBC 4.0 - 10.5 K/uL 9.8  10.0  7.6   Hemoglobin 12.0 - 15.0 g/dL 14.5  15.0  14.0   Hematocrit 36.0 - 46.0 % 43.9  43.8  41.7   Platelets 150 - 400 K/uL 211  193  153       Latest Ref Rng & Units 12/29/2021    1:11 PM 10/05/2021   11:46 AM 06/06/2021    2:26 PM  CMP  Glucose 70 - 99 mg/dL 109  102  127   BUN 6 - 20 mg/dL _0 Creatinine 0.44 - 1.00 mg/dL 0.56  0.56  0.61   Sodium 135 - 145 mmol/L 137  136  137    Potassium 3.5 - 5.1 mmol/L 3.5  3.5  3.8   Chloride 98 - 111 mmol/L 100  102  102   CO2 22 - 32 mmol/L _1 Calcium 8.9 - 10.3 mg/dL 9.3  8.8  9.0   Total Protein 6.5 - 8.1 g/dL 7.5  7.1  7.3   Total Bilirubin 0.3 - 1.2 mg/dL 0.8  0.8  0.9   Alkaline Phos 38 - 126 U/L 50  53  46   AST 15 - 41 U/L _2 ALT 0 - 44 U/L _3 DIAGNOSTIC IMAGING:  I have independently reviewed the scans and discussed with the patient. CT Chest W Contrast  Result  Date: 12/29/2021 CLINICAL DATA:  Lung cancer restaging, status post right lower lobectomy and chemotherapy * Tracking Code: BO * EXAM: CT CHEST WITH CONTRAST TECHNIQUE: Multidetector CT imaging of the chest was performed during intravenous contrast administration. RADIATION DOSE REDUCTION: This exam was performed according to the departmental dose-optimization program which includes automated exposure control, adjustment of the mA and/or kV according to patient size and/or use of iterative reconstruction technique. CONTRAST:  10m OMNIPAQUE IOHEXOL 300 MG/ML  SOLN COMPARISON:  06/06/2021 FINDINGS: Cardiovascular: Left chest port catheter. Aortic atherosclerosis. Normal heart size. Scattered left and right coronary artery calcifications. No pericardial effusion. Mediastinum/Nodes: No enlarged mediastinal, hilar, or axillary lymph nodes. Thyroid gland, trachea, and esophagus demonstrate no significant findings. Lungs/Pleura: Status post right lower lobectomy. Interval enlargement of a cavitary nodule of the dependent right upper lobe measuring 1.3 x 0.7 cm, previously no greater than 0.7 cm (series 4, image 92). Numerous new clustered nodules of varying sizes, generally subsolid and appearance throughout the right upper lobe (series 4, image 50). Index nodule of the medial right upper lobe measures 0.9 x 0.7 cm (series 4, image 46). Multiple additional bilateral pulmonary nodules are unchanged, including a 0.4 cm nodule of the left lower  lobe (series 4, image 71). Mild underlying paraseptal emphysema. No pleural effusion or pneumothorax. Upper Abdomen: No acute abnormality. Somewhat coarse contour of the liver. Musculoskeletal: No chest wall abnormality. Significant interval enlargement of a lytic metastasis with an expansile soft tissue component of the posterior left fifth rib, measuring 2.8 x 2.0 cm, previously a subtle lesion measuring no greater than 0.6 cm (series 2, image 61). IMPRESSION: 1. Status post right lower lobectomy. 2. Interval enlargement of a cavitary nodule of the dependent right upper lobe consistent with an enlarging metastasis. 3. 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 4. Numerous new clustered nodules of varying sizes, generally subsolid in appearance throughout the right upper lobe. Although morphologically these are most suggestive of nonspecific infection or inflammation, any new nodules are inherently suspicious for additional metastases given other unambiguous evidence of metastatic disease above on today's examination. 5. Multiple additional bilateral pulmonary nodules are unchanged, possibly benign and incidental underlying sequelae of prior infection or inflammation. 6. Emphysema. 7. Cirrhosis. 8. Coronary artery disease. These results will be called to the ordering clinician or representative by the Radiologist Assistant, and communication documented in the PACS or [employer] Aortic Atherosclerosis (ICD10-I70.0). Electronically Signed   By: ADelanna AhmadiM.D.   On: 12/29/2021 22:19     ASSESSMENT:  1.  Stage IIIa (PT1CPN2) right lung adenocarcinoma: -PET scan on 05/13/2019 showed right lower lobe nodule concerning for bronchogenic carcinoma.  Cirrhotic liver. -Right lower lobectomy and lymph node excision on 06/19/2019. -Pathology showed 1.7 cm right lower lobe adenocarcinoma, unifocal, lymphovascular invasion present.  Margins  negative.  2/14 lymph nodes positive (level 7 and 10R).  PT1CPN2. -PD-L1 30%, K-ras G12A, MSI stable.  EGFR mutation not identified. -4 cycles of adjuvant carboplatin and pemetrexed from 09/09/2019 through 11/11/2019. -CT chest on 12/03/2019 shows small volume partially loculated right-sided pleural fluid inferiorly and medially.  Minimal increased density within the pleural fluid related to complexity.  Right paratracheal lymph node which is not pathologic by size criteria.  Similar left lower lobe 5 mm pulmonary nodule nonspecific.  Cirrhosis and portal venous hypertension.   PLAN:  1.  Stage III right lung adenocarcinoma: - She is smoking about 2 to 3 cigarettes/day. -  For the last 2 weeks she has pain in the left lateral ribs when she uses her left arm.  Also reports decreased energy for 1 week. - Reviewed labs from 12/29/2021 which showed normal LFTs and CBC.  AFP was 2.3. - CT chest with contrast on 12/29/2021 showed lytic metastatic lesion in the posterior left fifth rib.  Interval enlargement of cavitary nodule of the dependent right upper lobe.  Numerous new clustered nodules of varying sizes. - Highly suspicious for metastatic disease.  Recommend PET CT scan.  Will see her back after the scan and discuss the need and site of biopsy. - She reports that she is claustrophobic.  Will give 1 mg of Xanax to be taken 1 hour prior to the scan.  She was told not to drive when she takes Xanax.   2.  Immune mediated thrombocytopenia: - She was previously treated for hep C infection and also has cirrhosis. - Platelet count is improved to 211.    Orders placed this encounter:  Orders Placed This Encounter  Procedures   NM PET Image Restag (PS) Skull Base To Thigh      Derek Jack, MD [place] [phone]

## 2022-01-19 ENCOUNTER — Encounter (HOSPITAL_COMMUNITY)
Admission: RE | Admit: 2022-01-19 | Discharge: 2022-01-19 | Disposition: A | Discharge status: Home / Self Care | Payer: Medicaid Other | Source: Ambulatory Visit | Attending: Hematology | Admitting: Hematology

## 2022-01-19 DIAGNOSIS — C3491 Malignant neoplasm of unspecified part of right bronchus or lung: Secondary | ICD-10-CM | POA: Diagnosis present

## 2022-01-19 MED ORDER — FLUDEOXYGLUCOSE F - 18 (FDG) INJECTION
8.4700 | Freq: Once | INTRAVENOUS | Status: AC | PRN
  Administered 2022-01-19: 8.47 via INTRAVENOUS

## 2022-01-26 ENCOUNTER — Inpatient Hospital Stay (HOSPITAL_BASED_OUTPATIENT_CLINIC_OR_DEPARTMENT_OTHER): Payer: Medicaid Other | Admitting: Hematology

## 2022-01-26 ENCOUNTER — Other Ambulatory Visit: Payer: Self-pay | Admitting: *Deleted

## 2022-01-26 DIAGNOSIS — C3491 Malignant neoplasm of unspecified part of right bronchus or lung: Secondary | ICD-10-CM | POA: Diagnosis not present

## 2022-01-26 DIAGNOSIS — Z08 Encounter for follow-up examination after completed treatment for malignant neoplasm: Secondary | ICD-10-CM | POA: Diagnosis not present

## 2022-01-26 MED ORDER — TRAMADOL HCL 50 MG PO TABS: 50.0000 mg | ORAL_TABLET | Freq: Every day | ORAL | 0 refills | Status: DC | PRN

## 2022-01-26 NOTE — Patient Instructions (Addendum)
[place] at [facility] Discharge Instructions   You were seen and examined today by Dr. Delton Coombes.  He reviewed the results of your PET scan. It shows an area in the right lung that is concerning for recurrence of your previous lung cancer or a new primary lung cancer.   We will need to obtain a biopsy to determine what type of cancer this is. We will arrange for you to have a biopsy of the left rib lesion. They will do this in [facility] with interventional radiology. They will give you sedation to do this procedure. You will need a driver to take you and bring you home due to the sedation.   We will see you back after the biopsy to discuss the treatment plan going forward.    Thank you for choosing [place] at [facility] to provide your oncology and hematology care.  To afford each patient quality time with our provider, please arrive at least 15 minutes before your scheduled appointment time.   If you have a lab appointment with the [facility] please come in thru the Main Entrance and check in at the main information desk.  You need to re-schedule your appointment should you arrive 10 or more minutes late.  We strive to give you quality time with our providers, and arriving late affects you and other patients whose appointments are after yours.  Also, if you no show three or more times for appointments you may be dismissed from the clinic at the providers discretion.     Again, thank you for choosing [facility].  Our hope is that these requests will decrease the amount of time that you wait before being seen by our physicians.       _____________________________________________________________  Should you have questions after your visit to [facility], please contact our office at [phone] and follow the prompts.  Our office hours are 8:00 a.m. and 4:30 p.m. Monday - Friday.  Please note that voicemails  left after 4:00 p.m. may not be returned until the following business day.  We are closed weekends and major holidays.  You do have access to a nurse 24-7, just call the main number to the clinic [phone] and do not press any options, hold on the line and a nurse will answer the phone.    For prescription refill requests, have your pharmacy contact our office and allow 72 hours.    Due to Covid, you will need to wear a mask upon entering the hospital. If you do not have a mask, a mask will be given to you at the Main Entrance upon arrival. For doctor visits, patients may have 1 support person age 99 or older with them. For treatment visits, patients can not have anyone with them due to social distancing guidelines and our immunocompromised population.

## 2022-01-26 NOTE — Progress Notes (Signed)
Rachel Cleveland, MD  [name] Ok  CT core L rib lesion (new) See PETCT 01/19/22 Im 82 Se3 DDH

## 2022-01-26 NOTE — Progress Notes (Signed)
[facility] [address], [place] [address]   CLINIC:  Medical Oncology/Hematology  PCP:  Celene Squibb, MD [address] [name] [address] [phone]   REASON FOR VISIT:  Follow-up for stage III right lung cancer  PRIOR THERAPY:  1. Right lower lobectomy on 06/19/2019. 2. Carboplatin and pemetrexed x 4 cycles from 09/09/2019 to 11/11/2019.  NGS Results: PD-L1 30%, Foundation 1 MS--stable, TMB 9 Muts/Mb  CURRENT THERAPY: surveillance  BRIEF ONCOLOGIC HISTORY:  Oncology History  Adenocarcinoma of lung, stage 3, right ([facility])  07/02/2019 Initial Diagnosis   Adenocarcinoma of lung, stage 3, right ([facility])   07/18/2019 Genetic Testing   Foundation One     07/23/2019 Genetic Testing   PD-L1     09/09/2019 - 11/11/2019 Chemotherapy   Patient is on Treatment Plan : LUNG NSCLC Pemetrexed ([place]) / Carboplatin q21d x 4 cycles       CANCER STAGING:  Cancer Staging  No matching staging information was found for the patient.  INTERVAL HISTORY:  [name], a 56 y.o. female, seen for follow-up of right lung cancer.  At previous visit CT scan showed abnormal findings.  She had done PET scan.  She continues to smoke 2 to 3 cigarettes/day.  Reports decrease in energy levels.  REVIEW OF SYSTEMS:  Review of Systems  Constitutional:  Negative for appetite change and fatigue.  HENT:   Negative for nosebleeds.   Respiratory:  Positive for cough and shortness of breath. Negative for hemoptysis.   Gastrointestinal:  Negative for blood in stool.  Genitourinary:  Negative for hematuria.   Neurological:  Positive for headaches.  All other systems reviewed and are negative.   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    Bipolar disorder ([facility])    Cancer (HCC)    right lung    COPD (chronic obstructive pulmonary disease) (HCC)    Depression    Dyspnea    Emphysema lung (HCC)    GERD (gastroesophageal reflux disease)    Hepatitis C    s/p treatment with Epclusa   History of HPV infection    History of pneumonia    Pneumonia    Smoker    Past Surgical History:  Procedure Laterality Date   BIOPSY  05/02/2018   Procedure: BIOPSY;  Surgeon: Daneil Dolin, MD;  [facility];  Service: Endoscopy;;  gastric   CESAREAN SECTION     CHEST TUBE INSERTION Right 06/26/2019   Procedure: Right CHEST TUBE REPLACEMENT;  Surgeon: [name], MD;  Location: [facility];  Service: Thoracic;  Laterality: Right;   COLONOSCOPY WITH PROPOFOL N/A 03/03/2019   Procedure: COLONOSCOPY WITH PROPOFOL;  Surgeon: Daneil Dolin, MD;  [facility];  Service: Endoscopy;  Laterality: N/A;  9:15am   ESOPHAGOGASTRODUODENOSCOPY (EGD) WITH PROPOFOL N/A 05/02/2018   Procedure: ESOPHAGOGASTRODUODENOSCOPY (EGD) WITH PROPOFOL;  Surgeon: Daneil Dolin, MD;  [facility];  Service: Endoscopy;  Laterality: N/A;  8:30am   EYE SURGERY Bilateral    cataract   HEMOSTASIS CLIP PLACEMENT  03/03/2019   Procedure: HEMOSTASIS CLIP PLACEMENT;  Surgeon: Daneil Dolin, MD;  [facility];  Service: Endoscopy;;   INTERCOSTAL NERVE BLOCK Right 06/19/2019   Procedure: Intercostal Nerve Block;  Surgeon: [name], MD;  Location: [facility];  Service: Thoracic;  Laterality: Right;   IR US GUIDE VASC ACCESS RIGHT  03/13/2018   LOBECTOMY     LYMPH NODE DISSECTION Right 06/19/2019   Procedure: Lymph Node Dissection;  Surgeon: [name], MD;  Location: [facility];  Service: Thoracic;  Laterality: Right;   POLYPECTOMY  03/03/2019   Procedure: POLYPECTOMY;  Surgeon: Daneil Dolin, MD;  [facility];  Service: Endoscopy;;   PORTACATH PLACEMENT Left 09/08/2019   Procedure: INSERTION PORT-A-CATH LEFT CHEST (attached catheter in left subclavian);  Surgeon:  Aviva Signs, MD;  Location: [facility];  Service: General;  Laterality: Left;   TONSILLECTOMY     TOOTH EXTRACTION  01/31/2018   x 7   VIDEO BRONCHOSCOPY WITH INSERTION OF INTERBRONCHIAL VALVE (IBV) N/A 06/26/2019   Procedure: VIDEO BRONCHOSCOPY WITH INSERTION OF TWO INTERBRONCHIAL VALVE (IBV);  Surgeon: [name], MD;  Location: [facility];  Service: Thoracic;  Laterality: N/A;   VIDEO BRONCHOSCOPY WITH INSERTION OF INTERBRONCHIAL VALVE (IBV) N/A 08/08/2019   Procedure: VIDEO BRONCHOSCOPY WITH REMOVAL OF INTERBRONCHIAL VALVE (IBV);  Surgeon: [name], MD;  Location: [facility];  Service: Thoracic;  Laterality: N/A;    SOCIAL HISTORY:  Social History   Socioeconomic History   Marital status: Single    Spouse name: Not on file   Number of children: Not on file   Years of education: Not on file   Highest education level: Not on file  Occupational History   Not on file  Tobacco Use   Smoking status: Former    Packs/day: 0.50    Years: 38.00    Total pack years: 19.00    Types: Cigarettes    Quit date: 06/19/2019    Years since quitting: 2.6   Smokeless tobacco: Never  Vaping Use   Vaping Use: Never used  Substance and Sexual Activity   Alcohol use: Not Currently    Comment: Last use of alcohol 03/2016   Drug use: Not Currently    Types: 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   Sexual activity: Not Currently  Other Topics Concern   Not on file  Social History Narrative   Not on file   Social Determinants of Health   Financial Resource Strain: Medium Risk (12/10/2019)   Overall Financial Resource Strain (CARDIA)    Difficulty of Paying Living Expenses: Somewhat hard  Food Insecurity: Food Insecurity Present (12/10/2019)   Hunger Vital Sign    Worried About Running Out of Food in the Last Year: Often true    Ran Out of Food in the Last Year: Often true  Transportation Needs: No Transportation Needs (12/10/2019)   PRAPARE - Hydrologist  (Medical): No    Lack of Transportation (Non-Medical): No  Physical Activity: Sufficiently Active (12/10/2019)   Exercise Vital Sign    Days of Exercise per Week: 3 days    Minutes of Exercise per Session: 150+ min  Stress: Stress Concern Present (12/10/2019)   [place]    Feeling of Stress : Very much  Social Connections: Socially Isolated (12/10/2019)   Social Connection and Isolation Panel [NHANES]    Frequency of Communication with Friends and Family: More than three times a week    Frequency of Social Gatherings with Friends and Family: Once a week    Attends Religious Services: Never    Retail buyer of [employer]  or Organizations: No    Attends Archivist Meetings: Never    Marital Status: Divorced  Human resources officer Violence: Not At Risk (12/10/2019)   Humiliation, Afraid, Rape, and Kick questionnaire    Fear of Current or Ex-Partner: No    Emotionally Abused: No    Physically Abused: No    Sexually Abused: No    FAMILY HISTORY:  Family History  Problem Relation Age of Onset   Congenital heart disease Mother    Bipolar disorder Mother    Prostate cancer Brother    Alzheimer's disease Paternal Grandmother    Colon cancer Neg Hx     CURRENT MEDICATIONS:  Current Outpatient Medications  Medication Sig Dispense Refill   albuterol (VENTOLIN HFA) 108 (90 Base) MCG/ACT inhaler Inhale 2 puffs into the lungs every 4 (four) hours as needed for wheezing or shortness of breath.     dexamethasone (DECADRON) 6 MG tablet      diphenhydrAMINE (BENADRYL) 25 MG tablet Take 25 mg by mouth at bedtime.     divalproex (DEPAKOTE) 250 MG DR tablet Take 250 mg by mouth at bedtime.      folic acid (FOLVITE) 1 MG tablet Take 1 mg by mouth daily.     levofloxacin (LEVAQUIN) 500 MG tablet Take 1 tablet (500 mg total) by mouth daily. 10 tablet 0   lidocaine-prilocaine (EMLA) cream Apply a small amount to port a cath site  and cover with plastic wrap 1 hour prior to chemotherapy appointments 30 g 3   ondansetron (ZOFRAN ODT) 8 MG disintegrating tablet Place 1 tablet under tongue every 8 hours as needed for nausea. You may begin using this medication on the third day after each chemotherapy treatment. 20 tablet 1   pantoprazole (PROTONIX) 40 MG tablet TAKE 1 TABLET BY MOUTH DAILY BEFORE AND BREAKFAST 90 tablet 1   risperiDONE (RISPERDAL) 2 MG tablet Take 2 mg by mouth at bedtime.      scopolamine (TRANSDERM-SCOP) 1 MG/3DAYS Place 1 patch (1.5 mg total) onto the skin every 3 (three) days. 10 patch 12   SYMBICORT 160-4.5 MCG/ACT inhaler Inhale 2 puffs into the lungs 2 (two) times daily. 1 each 6   traMADol (ULTRAM) 50 MG tablet Take 1 tablet (50 mg total) by mouth daily as needed. 30 tablet 0   traZODone (DESYREL) 100 MG tablet Take by mouth.     No current facility-administered medications for this visit.    ALLERGIES:  Allergies  Allergen Reactions   Folic Acid Anaphylaxis    Not entirely clear if it is from folic acid. She is taking folic acid now and doesn't have the throat swelling anymore   Penicillins Anaphylaxis    Throat swelled Did it involve swelling of the face/tongue/throat, SOB, or low BP? Yes Did it involve sudden or severe rash/hives, skin peeling, or any reaction on the inside of your mouth or nose? No Did you need to seek medical attention at a hospital or doctor's office? No When did it last happen?      childhood allergy If all above answers are "NO", may proceed with cephalosporin use.    Amoxicillin     hallucinations Did it involve swelling of the face/tongue/throat, SOB, or low BP? No Did it involve sudden or severe rash/hives, skin peeling, or any reaction on the inside of your mouth or nose? No Did you need to seek medical attention at a hospital or doctor's office? Yes When did it last happen?  July 2019 If all above answers are "NO", may proceed with cephalosporin use.     Fish Allergy Nausea Only   Other Swelling    Patient states that the sunrise brand folic acid made her feel like her throat was swelling.     PHYSICAL EXAM:  Performance status (ECOG): 1 - Symptomatic but completely ambulatory  There were no vitals filed for this visit.  Wt Readings from Last 3 Encounters:  01/05/22 158 lb 11.2 oz (72 kg)  07/05/21 168 lb 4.8 oz (76.3 kg)  04/26/21 171 lb (77.6 kg)   Physical Exam Vitals reviewed.  Constitutional:      Appearance: Normal appearance. She is obese.  Cardiovascular:     Rate and Rhythm: Normal rate and regular rhythm.     Pulses: Normal pulses.     Heart sounds: Normal heart sounds.  Pulmonary:     Effort: Pulmonary effort is normal.     Breath sounds: Normal breath sounds.  Neurological:     General: No focal deficit present.     Mental Status: She is alert and oriented to person, place, and time.  Psychiatric:        Mood and Affect: Mood normal.        Behavior: Behavior normal.      LABORATORY DATA:  I have reviewed the labs as listed.     Latest Ref Rng & Units 12/29/2021    1:11 PM 10/05/2021   11:46 AM 06/06/2021    2:26 PM  CBC  WBC 4.0 - 10.5 K/uL 9.8  10.0  7.6   Hemoglobin 12.0 - 15.0 g/dL 14.5  15.0  14.0   Hematocrit 36.0 - 46.0 % 43.9  43.8  41.7   Platelets 150 - 400 K/uL 211  193  153       Latest Ref Rng & Units 12/29/2021    1:11 PM 10/05/2021   11:46 AM 06/06/2021    2:26 PM  CMP  Glucose 70 - 99 mg/dL 109  102  127   BUN 6 - 20 mg/dL _0 Creatinine 0.44 - 1.00 mg/dL 0.56  0.56  0.61   Sodium 135 - 145 mmol/L 137  136  137   Potassium 3.5 - 5.1 mmol/L 3.5  3.5  3.8   Chloride 98 - 111 mmol/L 100  102  102   CO2 22 - 32 mmol/L _1 Calcium 8.9 - 10.3 mg/dL 9.3  8.8  9.0   Total Protein 6.5 - 8.1 g/dL 7.5  7.1  7.3   Total Bilirubin 0.3 - 1.2 mg/dL 0.8  0.8  0.9   Alkaline Phos 38 - 126 U/L 50  53  46   AST 15 - 41 U/L _2 ALT 0 - 44 U/L _3 DIAGNOSTIC  IMAGING:  I have independently reviewed the scans and discussed with the patient. NM PET Image Restag (PS) Skull Base To Thigh  Result Date: 01/24/2022 CLINICAL DATA:  Subsequent treatment strategy for lung cancer status post right lower lobectomy and chemotherapy. Enlarging cavitary right upper lobe pulmonary nodule on CT. EXAM: NUCLEAR MEDICINE PET SKULL BASE TO THIGH TECHNIQUE: 8.47 mCi F-18 FDG was injected intravenously. Full-ring PET imaging was performed from the skull base to thigh after the radiotracer. CT data was obtained and used for attenuation correction and anatomic localization. Fasting blood glucose: 126 mg/dl  COMPARISON:  Chest CT 12/29/2021 and 06/06/2021.  PET-CT 05/13/2019 FINDINGS: Mediastinal blood pool activity: SUV max 1.5 NECK: No hypermetabolic cervical lymph nodes are identified. No suspicious activity identified within the pharyngeal mucosal space. Incidental CT findings: none CHEST: There are no hypermetabolic mediastinal, hilar or axillary lymph nodes. Patient is status post right lower lobectomy. The cavitary nodule of concern on recent CT is located posteriorly in the right middle lobe, measuring 1.3 x 0.7 cm on image 57/7, and is hypermetabolic for size with an SUV max of 1.8. No other hypermetabolic or suspicious pulmonary nodules are identified. Perifissural nodularity along the minor fissure is unchanged, without metabolic activity. Incidental CT findings: Postsurgical changes as noted above. Atherosclerosis of the aorta, great vessels and coronary arteries. ABDOMEN/PELVIS: There is no hypermetabolic activity within the liver, adrenal glands, spleen or pancreas. There is no hypermetabolic nodal activity in the abdomen or pelvis. Incidental CT findings: Morphologic changes of cirrhosis. Nonobstructing left renal calculus, mild diffuse colonic diverticulosis and aortic and branch vessel atherosclerosis are noted. There is a stable incidental lipoma within the 3rd portion of  the duodenum. SKELETON: Expansile lytic metastasis involving the left 5th rib is hypermetabolic. This lesion measures 2.6 x 1.9 cm on image 82/3 and has an SUV max of 4.2. No other definite hypermetabolic osseous metastases identified. However, there is new prominent nearly symmetric metabolic activity posteromedially within all of the ribs. This may be stress related based on the extent. Early metastases would be difficult to exclude. No vertebral body involvement. Incidental CT findings: none IMPRESSION: 1. The cavitary right middle lobe nodule of concern on recent CT is hypermetabolic for size and remains suspicious for bronchogenic carcinoma (metastatic disease versus new primary). 2. 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 No other definite osseous metastases identified. However, there is new prominent nearly symmetric metabolic activity posteromedially within all of the ribs bilaterally which may be stress-related. Early metastases would be difficult to exclude. 3. No evidence of extra thoracic metastatic disease. 4. Morphologic changes of cirrhosis and nonobstructing left renal calculus. 5.  Aortic Atherosclerosis (ICD10-I70.0). Electronically Signed   By: Richardean Sale M.D.   On: 01/24/2022 11:32   CT Chest W Contrast  Result Date: 12/29/2021 CLINICAL DATA:  Lung cancer restaging, status post right lower lobectomy and chemotherapy * Tracking Code: BO * EXAM: CT CHEST WITH CONTRAST TECHNIQUE: Multidetector CT imaging of the chest was performed during intravenous contrast administration. RADIATION DOSE REDUCTION: This exam was performed according to the departmental dose-optimization program which includes automated exposure control, adjustment of the mA and/or kV according to patient size and/or use of iterative reconstruction technique. CONTRAST:  35m OMNIPAQUE IOHEXOL 300 MG/ML  SOLN COMPARISON:  06/06/2021 FINDINGS: Cardiovascular: Left chest port catheter. Aortic  atherosclerosis. Normal heart size. Scattered left and right coronary artery calcifications. No pericardial effusion. Mediastinum/Nodes: No enlarged mediastinal, hilar, or axillary lymph nodes. Thyroid gland, trachea, and esophagus demonstrate no significant findings. Lungs/Pleura: Status post right lower lobectomy. Interval enlargement of a cavitary nodule of the dependent right upper lobe measuring 1.3 x 0.7 cm, previously no greater than 0.7 cm (series 4, image 92). Numerous new clustered nodules of varying sizes, generally subsolid and appearance throughout the right upper lobe (series 4, image 50). Index nodule of the medial right upper lobe measures 0.9 x 0.7 cm (series 4, image 46). Multiple additional bilateral pulmonary nodules are unchanged, including a 0.4 cm nodule of the left lower lobe (series 4, image 71). Mild underlying paraseptal emphysema.  No pleural effusion or pneumothorax. Upper Abdomen: No acute abnormality. Somewhat coarse contour of the liver. Musculoskeletal: No chest wall abnormality. Significant interval enlargement of a lytic metastasis with an expansile soft tissue component of the posterior left fifth rib, measuring 2.8 x 2.0 cm, previously a subtle lesion measuring no greater than 0.6 cm (series 2, image 61). IMPRESSION: 1. Status post right lower lobectomy. 2. Interval enlargement of a cavitary nodule of the dependent right upper lobe consistent with an enlarging metastasis. 3. 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 4. Numerous new clustered nodules of varying sizes, generally subsolid in appearance throughout the right upper lobe. Although morphologically these are most suggestive of nonspecific infection or inflammation, any new nodules are inherently suspicious for additional metastases given other unambiguous evidence of metastatic disease above on today's examination. 5. Multiple additional bilateral  pulmonary nodules are unchanged, possibly benign and incidental underlying sequelae of prior infection or inflammation. 6. Emphysema. 7. Cirrhosis. 8. Coronary artery disease. These results will be called to the ordering clinician or representative by the Radiologist Assistant, and communication documented in the PACS or [employer]. Aortic Atherosclerosis (ICD10-I70.0). Electronically Signed   By: Delanna Ahmadi M.D.   On: 12/29/2021 22:19     ASSESSMENT:  1.  Stage IIIa (PT1CPN2) right lung adenocarcinoma: -PET scan on 05/13/2019 showed right lower lobe nodule concerning for bronchogenic carcinoma.  Cirrhotic liver. -Right lower lobectomy and lymph node excision on 06/19/2019. -Pathology showed 1.7 cm right lower lobe adenocarcinoma, unifocal, lymphovascular invasion present.  Margins negative.  2/14 lymph nodes positive (level 7 and 10R).  PT1CPN2. -PD-L1 30%, K-ras G12A, MSI stable.  EGFR mutation not identified. -4 cycles of adjuvant carboplatin and pemetrexed from 09/09/2019 through 11/11/2019. -CT chest on 12/03/2019 shows small volume partially loculated right-sided pleural fluid inferiorly and medially.  Minimal increased density within the pleural fluid related to complexity.  Right paratracheal lymph node which is not pathologic by size criteria.  Similar left lower lobe 5 mm pulmonary nodule nonspecific.  Cirrhosis and portal venous hypertension.   PLAN:  1.  Stage III right lung adenocarcinoma: - CT chest on 12/29/2021: Lytic metastatic lesion in the posterior left fifth rib.  Interval enlargement of cavitary nodule of the dependent right upper lobe.  Numerous new clustered nodules of varying sizes. - PET scan (01/19/2022): Cavitary nodule of concern in the right middle lobe measures 1.3 x 0.7 cm, SUV max of 1.8.  No other hypermetabolic or suspicious lung nodules.  Expansile lytic metastasis involving left fifth rib hypermetabolic measuring 2.6 x 1.9 cm with SUV 4.2. - She continues to  smoke 2 to 3 cigarettes/day. - Recommend left rib lesion biopsy by IR. - RTC 1 week after biopsy.  2.  Left chest wall pain: - She reports left lateral rib pain, sharp in nature on lifting left arm and hurts slightly when she takes deep breaths.  She is having pain while she turns the steering of the car with her left hand.  She also reports left shoulder being stiff in the mornings. - I have given prescription for tramadol 50 mg to be taken as needed.  3.  Immune mediated thrombocytopenia: - She was previously treated for hep C infection and also has cirrhosis. - Lately platelet count has been in the normal limits.    Orders placed this encounter:  No orders of the defined types were placed in this encounter.     Dirk Dress  [place], [place] [phone]

## 2022-01-31 ENCOUNTER — Encounter: Payer: Self-pay | Admitting: *Deleted

## 2022-01-31 NOTE — Progress Notes (Signed)
[employer] approved Tramadol 50 mg tablets from 01/26/22-07/28/22.

## 2022-02-06 ENCOUNTER — Other Ambulatory Visit: Payer: Self-pay

## 2022-02-06 ENCOUNTER — Ambulatory Visit (HOSPITAL_COMMUNITY)
Admission: RE | Admit: 2022-02-06 | Discharge: 2022-02-06 | Disposition: A | Discharge status: Home / Self Care | Payer: Medicaid Other | Source: Ambulatory Visit | Attending: Hematology | Admitting: Hematology

## 2022-02-06 ENCOUNTER — Encounter (HOSPITAL_COMMUNITY): Payer: Self-pay

## 2022-02-06 ENCOUNTER — Ambulatory Visit (HOSPITAL_COMMUNITY)
Admission: RE | Admit: 2022-02-06 | Discharge: 2022-02-06 | Disposition: A | Discharge status: Home / Self Care | Payer: Medicaid Other | Source: Ambulatory Visit | Attending: Interventional Radiology | Admitting: Interventional Radiology

## 2022-02-06 ENCOUNTER — Other Ambulatory Visit: Payer: Self-pay | Admitting: Hematology

## 2022-02-06 VITALS — BP 103/61 | HR 89 | Temp 98.1°F | Resp 16 | Ht 61.0 in | Wt 155.0 lb

## 2022-02-06 DIAGNOSIS — F419 Anxiety disorder, unspecified: Secondary | ICD-10-CM | POA: Diagnosis not present

## 2022-02-06 DIAGNOSIS — K7010 Alcoholic hepatitis without ascites: Secondary | ICD-10-CM | POA: Diagnosis not present

## 2022-02-06 DIAGNOSIS — Z7951 Long term (current) use of inhaled steroids: Secondary | ICD-10-CM | POA: Diagnosis not present

## 2022-02-06 DIAGNOSIS — R0789 Other chest pain: Secondary | ICD-10-CM | POA: Diagnosis not present

## 2022-02-06 DIAGNOSIS — M1990 Unspecified osteoarthritis, unspecified site: Secondary | ICD-10-CM | POA: Insufficient documentation

## 2022-02-06 DIAGNOSIS — F17200 Nicotine dependence, unspecified, uncomplicated: Secondary | ICD-10-CM | POA: Insufficient documentation

## 2022-02-06 DIAGNOSIS — J449 Chronic obstructive pulmonary disease, unspecified: Secondary | ICD-10-CM | POA: Diagnosis not present

## 2022-02-06 DIAGNOSIS — B1920 Unspecified viral hepatitis C without hepatic coma: Secondary | ICD-10-CM | POA: Diagnosis not present

## 2022-02-06 DIAGNOSIS — Z79899 Other long term (current) drug therapy: Secondary | ICD-10-CM | POA: Diagnosis not present

## 2022-02-06 DIAGNOSIS — K7460 Unspecified cirrhosis of liver: Secondary | ICD-10-CM | POA: Insufficient documentation

## 2022-02-06 DIAGNOSIS — I2510 Atherosclerotic heart disease of native coronary artery without angina pectoris: Secondary | ICD-10-CM | POA: Diagnosis not present

## 2022-02-06 DIAGNOSIS — F319 Bipolar disorder, unspecified: Secondary | ICD-10-CM | POA: Diagnosis not present

## 2022-02-06 DIAGNOSIS — C7951 Secondary malignant neoplasm of bone: Secondary | ICD-10-CM | POA: Insufficient documentation

## 2022-02-06 DIAGNOSIS — C3491 Malignant neoplasm of unspecified part of right bronchus or lung: Secondary | ICD-10-CM | POA: Insufficient documentation

## 2022-02-06 DIAGNOSIS — I700 Atherosclerosis of aorta: Secondary | ICD-10-CM | POA: Insufficient documentation

## 2022-02-06 DIAGNOSIS — K219 Gastro-esophageal reflux disease without esophagitis: Secondary | ICD-10-CM | POA: Diagnosis not present

## 2022-02-06 DIAGNOSIS — Z01818 Encounter for other preprocedural examination: Secondary | ICD-10-CM

## 2022-02-06 LAB — CBC WITH DIFFERENTIAL/PLATELET
Abs Immature Granulocytes: 0.03 10*3/uL (ref 0.00–0.07)
Basophils Absolute: 0 10*3/uL (ref 0.0–0.1)
Basophils Relative: 0 %
Eosinophils Absolute: 0.1 10*3/uL (ref 0.0–0.5)
Eosinophils Relative: 1 %
HCT: 43.3 % (ref 36.0–46.0)
Hemoglobin: 14.3 g/dL (ref 12.0–15.0)
Immature Granulocytes: 0 %
Lymphocytes Relative: 22 %
Lymphs Abs: 1.8 10*3/uL (ref 0.7–4.0)
MCH: 31.7 pg (ref 26.0–34.0)
MCHC: 33 g/dL (ref 30.0–36.0)
MCV: 96 fL (ref 80.0–100.0)
Monocytes Absolute: 0.7 10*3/uL (ref 0.1–1.0)
Monocytes Relative: 9 %
Neutro Abs: 5.8 10*3/uL (ref 1.7–7.7)
Neutrophils Relative %: 68 %
Platelets: 185 10*3/uL (ref 150–400)
RBC: 4.51 MIL/uL (ref 3.87–5.11)
RDW: 12.9 % (ref 11.5–15.5)
WBC: 8.5 10*3/uL (ref 4.0–10.5)
nRBC: 0 % (ref 0.0–0.2)

## 2022-02-06 LAB — BASIC METABOLIC PANEL
Anion gap: 9 (ref 5–15)
BUN: 8 mg/dL (ref 6–20)
CO2: 27 mmol/L (ref 22–32)
Calcium: 8.8 mg/dL — ABNORMAL LOW (ref 8.9–10.3)
Chloride: 99 mmol/L (ref 98–111)
Creatinine, Ser: 0.56 mg/dL (ref 0.44–1.00)
GFR, Estimated: >60 mL/min (ref >60)
Glucose, Bld: 96 mg/dL (ref 70–99)
Potassium: 3.6 mmol/L (ref 3.5–5.1)
Sodium: 135 mmol/L (ref 135–145)

## 2022-02-06 LAB — PROTIME-INR
INR: 1 (ref 0.8–1.2)
Prothrombin Time: 13.6 seconds (ref 11.4–15.2)

## 2022-02-06 MED ORDER — FENTANYL CITRATE (PF) 100 MCG/2ML IJ SOLN
INTRAMUSCULAR | Status: AC | PRN
  Administered 2022-02-06: 50 ug via INTRAVENOUS

## 2022-02-06 MED ORDER — FLUMAZENIL 0.5 MG/5ML IV SOLN
INTRAVENOUS | Status: AC
  Filled 2022-02-06: qty 5

## 2022-02-06 MED ORDER — SODIUM CHLORIDE 0.9 % IV SOLN: INTRAVENOUS | Status: DC

## 2022-02-06 MED ORDER — NALOXONE HCL 0.4 MG/ML IJ SOLN
INTRAMUSCULAR | Status: AC
  Filled 2022-02-06: qty 1

## 2022-02-06 MED ORDER — LIDOCAINE HCL (PF) 1 % IJ SOLN
INTRAMUSCULAR | Status: AC | PRN
  Administered 2022-02-06: 10 mL

## 2022-02-06 MED ORDER — MIDAZOLAM HCL 2 MG/2ML IJ SOLN
INTRAMUSCULAR | Status: AC
  Filled 2022-02-06: qty 4

## 2022-02-06 MED ORDER — MIDAZOLAM HCL 2 MG/2ML IJ SOLN
INTRAMUSCULAR | Status: AC | PRN
  Administered 2022-02-06: 1 mg via INTRAVENOUS

## 2022-02-06 MED ORDER — HEPARIN SOD (PORK) LOCK FLUSH 100 UNIT/ML IV SOLN
500.0000 [IU] | Freq: Once | INTRAVENOUS | Status: AC
  Administered 2022-02-06: 500 [IU] via INTRAVENOUS
  Filled 2022-02-06: qty 5

## 2022-02-06 MED ORDER — FENTANYL CITRATE (PF) 100 MCG/2ML IJ SOLN
INTRAMUSCULAR | Status: AC
  Filled 2022-02-06: qty 4

## 2022-02-06 NOTE — Consult Note (Signed)
Chief Complaint: Patient was seen in consultation today for    Referring Physician(s): Katragadda,Sreedhar  Supervising Physician: Jacqulynn Cadet  Patient Status: [facility] - Out-pt  History of Present Illness: Rachel Gross is a 57 y.o. female smoker with past medical history significant for alcoholic hepatitis, cirrhosis/coronary artery disease by imaging ,anxiety, depression, arthritis, bipolar disorder, COPD, GERD, hepatitis C, and stage IIIa right lung adenocarcinoma diagnosed in 2021, status post right lower lobectomy and lymph node excision.  She presents now with left chest wall pain and recent PET scan revealing: 1. The cavitary right middle lobe nodule of concern on recent CT is hypermetabolic for size and remains suspicious for bronchogenic carcinoma (metastatic disease versus new primary). 2. 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 No other definite osseous metastases identified. However, there is new prominent nearly symmetric metabolic activity posteromedially within all of the ribs bilaterally which may be stress-related. Early metastases would be difficult to exclude. 3. No evidence of extra thoracic metastatic disease. 4. Morphologic changes of cirrhosis and nonobstructing left renal calculus. 5.  Aortic Atherosclerosis  She is scheduled today for CT-guided left fifth rib lesion biopsy.    Past Medical History:  Diagnosis Date   Alcoholic hepatitis without ascites    Anxiety    Arthritis    knees, hands   Atypical squamous cell of undetermined significance of cervix    Bipolar disorder (HCC)    Cancer (HCC)    right lung   COPD (chronic obstructive pulmonary disease) (HCC)    Depression    Dyspnea    Emphysema lung (HCC)    GERD (gastroesophageal reflux disease)    Hepatitis C    s/p treatment with Epclusa   History of HPV infection    History of pneumonia    Pneumonia    Smoker     Past Surgical History:   Procedure Laterality Date   BIOPSY  05/02/2018   Procedure: BIOPSY;  Surgeon: Daneil Dolin, MD;  [facility];  Service: Endoscopy;;  gastric   CESAREAN SECTION     CHEST TUBE INSERTION Right 06/26/2019   Procedure: Right CHEST TUBE REPLACEMENT;  Surgeon: [name], MD;  Location: [place];  Service: Thoracic;  Laterality: Right;   COLONOSCOPY WITH PROPOFOL N/A 03/03/2019   Procedure: COLONOSCOPY WITH PROPOFOL;  Surgeon: Daneil Dolin, MD;  [facility];  Service: Endoscopy;  Laterality: N/A;  9:15am   ESOPHAGOGASTRODUODENOSCOPY (EGD) WITH PROPOFOL N/A 05/02/2018   Procedure: ESOPHAGOGASTRODUODENOSCOPY (EGD) WITH PROPOFOL;  Surgeon: Daneil Dolin, MD;  [facility];  Service: Endoscopy;  Laterality: N/A;  8:30am   EYE SURGERY Bilateral    cataract   HEMOSTASIS CLIP PLACEMENT  03/03/2019   Procedure: HEMOSTASIS CLIP PLACEMENT;  Surgeon: Daneil Dolin, MD;  [facility];  Service: Endoscopy;;   INTERCOSTAL NERVE BLOCK Right 06/19/2019   Procedure: Intercostal Nerve Block;  Surgeon: [name], MD;  Location: [place];  Service: Thoracic;  Laterality: Right;   IR US GUIDE VASC ACCESS RIGHT  03/13/2018   LOBECTOMY     LYMPH NODE DISSECTION Right 06/19/2019   Procedure: Lymph Node Dissection;  Surgeon: [name], MD;  Location: [place];  Service: Thoracic;  Laterality: Right;   POLYPECTOMY  03/03/2019   Procedure: POLYPECTOMY;  Surgeon: Daneil Dolin, MD;  [facility];  Service: Endoscopy;;   PORTACATH PLACEMENT Left 09/08/2019   Procedure: INSERTION PORT-A-CATH LEFT CHEST (attached catheter in  left subclavian);  Surgeon: Aviva Signs, MD;  Location: [facility];  Service: General;  Laterality: Left;   TONSILLECTOMY     TOOTH EXTRACTION  01/31/2018   x 7   VIDEO BRONCHOSCOPY WITH INSERTION OF INTERBRONCHIAL VALVE (IBV) N/A 06/26/2019   Procedure: VIDEO BRONCHOSCOPY WITH INSERTION OF TWO INTERBRONCHIAL VALVE (IBV);   Surgeon: [name], MD;  Location: [facility];  Service: Thoracic;  Laterality: N/A;   VIDEO BRONCHOSCOPY WITH INSERTION OF INTERBRONCHIAL VALVE (IBV) N/A 08/08/2019   Procedure: VIDEO BRONCHOSCOPY WITH REMOVAL OF INTERBRONCHIAL VALVE (IBV);  Surgeon: [name], MD;  Location: [facility] OR;  Service: Thoracic;  Laterality: N/A;    Allergies: Folic acid, Penicillins, Amoxicillin, Fish allergy, and Other  Medications: Prior to Admission medications   Medication Sig Start Date End Date Taking? Authorizing Provider  albuterol (VENTOLIN HFA) 108 (90 Base) MCG/ACT inhaler Inhale 2 puffs into the lungs every 4 (four) hours as needed for wheezing or shortness of breath. 01/17/19   [provider]  dexamethasone (DECADRON) 6 MG tablet  02/17/20   [provider]  diphenhydrAMINE (BENADRYL) 25 MG tablet Take 25 mg by mouth at bedtime.    [provider]  divalproex (DEPAKOTE) 250 MG DR tablet Take 250 mg by mouth at bedtime.     [provider]  folic acid (FOLVITE) 1 MG tablet Take 1 mg by mouth daily. 10/30/19   [provider]  lidocaine-prilocaine (EMLA) cream Apply a small amount to port a cath site and cover with plastic wrap 1 hour prior to chemotherapy appointments 08/27/19   Derek Jack, MD  ondansetron (ZOFRAN ODT) 8 MG disintegrating tablet Place 1 tablet under tongue every 8 hours as needed for nausea. You may begin using this medication on the third day after each chemotherapy treatment. 08/27/19   Derek Jack, MD  pantoprazole (PROTONIX) 40 MG tablet TAKE 1 TABLET BY MOUTH DAILY BEFORE AND BREAKFAST 04/05/21   Annitta Needs, NP  risperiDONE (RISPERDAL) 2 MG tablet Take 2 mg by mouth at bedtime.     [provider]  scopolamine (TRANSDERM-SCOP) 1 MG/3DAYS Place 1 patch (1.5 mg total) onto the skin every 3 (three) days. 08/27/19   Derek Jack, MD  SYMBICORT 160-4.5 MCG/ACT inhaler Inhale 2 puffs into the lungs  2 (two) times daily. 08/22/21   Derek Jack, MD  traMADol (ULTRAM) 50 MG tablet Take 1 tablet (50 mg total) by mouth daily as needed. 01/26/22   Derek Jack, MD  traZODone (DESYREL) 100 MG tablet Take by mouth. 12/28/21   [provider]     Family History  Problem Relation Age of Onset   Congenital heart disease Mother    Bipolar disorder Mother    Prostate cancer Brother    Alzheimer's disease Paternal Grandmother    Colon cancer Neg Hx     Social History   Socioeconomic History   Marital status: Single    Spouse name: Not on file   Number of children: Not on file   Years of education: Not on file   Highest education level: Not on file  Occupational History   Not on file  Tobacco Use   Smoking status: Former    Packs/day: 0.50    Years: 38.00    Total pack years: 19.00    Types: Cigarettes    Quit date: 06/19/2019    Years since quitting: 2.6   Smokeless tobacco: Never  Vaping Use   Vaping Use: Never used  Substance  and Sexual Activity   Alcohol use: Not Currently    Comment: Last use of alcohol 03/2016   Drug use: Not Currently    Types: 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   Sexual activity: Not Currently  Other Topics Concern   Not on file  Social History Narrative   Not on file   Social Determinants of Health   Financial Resource Strain: Medium Risk (12/10/2019)   Overall Financial Resource Strain (CARDIA)    Difficulty of Paying Living Expenses: Somewhat hard  Food Insecurity: Food Insecurity Present (12/10/2019)   Hunger Vital Sign    Worried About Running Out of Food in the Last Year: Often true    Ran Out of Food in the Last Year: Often true  Transportation Needs: No Transportation Needs (12/10/2019)   PRAPARE - Hydrologist (Medical): No    Lack of Transportation (Non-Medical): No  Physical Activity: Sufficiently Active (12/10/2019)   Exercise Vital Sign    Days of Exercise per Week: 3 days    Minutes  of Exercise per Session: 150+ min  Stress: Stress Concern Present (12/10/2019)   [place]    Feeling of Stress : Very much  Social Connections: Socially Isolated (12/10/2019)   Social Connection and Isolation Panel [NHANES]    Frequency of Communication with Friends and Family: More than three times a week    Frequency of Social Gatherings with Friends and Family: Once a week    Attends Religious Services: Never    Marine scientist or Organizations: No    Attends Archivist Meetings: Never    Marital Status: Divorced      Review of Systems; She denies fever, HA,N/V or bleeding. She does have left sided CP, chronic dyspnea, occ cough, occ abd pain.   Vital Signs: blood pressure 114/74, temp 98.1, heart rate 91, respirations 16, O2 sat 92% room air      Physical Exam awake, alert.  Chest with scattered wheezes bilaterally.  Clean, intact left chest wall Port-A-Cath.  Heart with regular rate and rhythm.  Abd soft, positive bowel sounds, some mild generalized tenderness to palpation.  No significant lower extremity edema.  Imaging: NM PET Image Restag (PS) Skull Base To Thigh  Result Date: 01/24/2022 CLINICAL DATA:  Subsequent treatment strategy for lung cancer status post right lower lobectomy and chemotherapy. Enlarging cavitary right upper lobe pulmonary nodule on CT. EXAM: NUCLEAR MEDICINE PET SKULL BASE TO THIGH TECHNIQUE: 8.47 mCi F-18 FDG was injected intravenously. Full-ring PET imaging was performed from the skull base to thigh after the radiotracer. CT data was obtained and used for attenuation correction and anatomic localization. Fasting blood glucose: 126 mg/dl COMPARISON:  Chest CT 12/29/2021 and 06/06/2021.  PET-CT 05/13/2019 FINDINGS: Mediastinal blood pool activity: SUV max 1.5 NECK: No hypermetabolic cervical lymph nodes are identified. No suspicious activity identified within the  pharyngeal mucosal space. Incidental CT findings: none CHEST: There are no hypermetabolic mediastinal, hilar or axillary lymph nodes. Patient is status post right lower lobectomy. The cavitary nodule of concern on recent CT is located posteriorly in the right middle lobe, measuring 1.3 x 0.7 cm on image 57/7, and is hypermetabolic for size with an SUV max of 1.8. No other hypermetabolic or suspicious pulmonary nodules are identified. Perifissural nodularity along the minor fissure is unchanged, without metabolic activity. Incidental CT findings: Postsurgical changes as noted above. Atherosclerosis of the aorta, great vessels and  coronary arteries. ABDOMEN/PELVIS: There is no hypermetabolic activity within the liver, adrenal glands, spleen or pancreas. There is no hypermetabolic nodal activity in the abdomen or pelvis. Incidental CT findings: Morphologic changes of cirrhosis. Nonobstructing left renal calculus, mild diffuse colonic diverticulosis and aortic and branch vessel atherosclerosis are noted. There is a stable incidental lipoma within the 3rd portion of the duodenum. SKELETON: Expansile lytic metastasis involving the left 5th rib is hypermetabolic. This lesion measures 2.6 x 1.9 cm on image 82/3 and has an SUV max of 4.2. No other definite hypermetabolic osseous metastases identified. However, there is new prominent nearly symmetric metabolic activity posteromedially within all of the ribs. This may be stress related based on the extent. Early metastases would be difficult to exclude. No vertebral body involvement. Incidental CT findings: none IMPRESSION: 1. The cavitary right middle lobe nodule of concern on recent CT is hypermetabolic for size and remains suspicious for bronchogenic carcinoma (metastatic disease versus new primary). 2. 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 No other definite osseous metastases identified. However, there is new prominent nearly symmetric  metabolic activity posteromedially within all of the ribs bilaterally which may be stress-related. Early metastases would be difficult to exclude. 3. No evidence of extra thoracic metastatic disease. 4. Morphologic changes of cirrhosis and nonobstructing left renal calculus. 5.  Aortic Atherosclerosis (ICD10-I70.0). Electronically Signed   By: Richardean Sale M.D.   On: 01/24/2022 11:32    Labs:  CBC: Recent Labs    02/25/21 1411 06/06/21 1426 10/05/21 1146 12/29/21 1311  WBC 10.8* 7.6 10.0 9.8  HGB 15.6* 14.0 15.0 14.5  HCT 47.7* 41.7 43.8 43.9  PLT 162 153 193 211    COAGS: Recent Labs    06/06/21 1426  INR 1.0    BMP: Recent Labs    02/25/21 1411 06/06/21 1426 10/05/21 1146 12/29/21 1311  NA 135 137 136 137  K 4.1 3.8 3.5 3.5  CL 98 102 102 100  CO2 27 26 24 26   GLUCOSE 108* 127* 102* 109*  BUN 8 12 5* 8  CALCIUM 9.2 9.0 8.8* 9.3  CREATININE 0.62 0.61 0.56 0.56  GFRNONAA >60 >60 >60 >60    LIVER FUNCTION TESTS: Recent Labs    06/06/21 1426 10/05/21 1146 12/29/21 1311  BILITOT 0.9 0.8 0.8  AST 28 30 21   ALT 23 24 13   ALKPHOS 46 53 50  PROT 7.3 7.1 7.5  ALBUMIN 4.1 4.1 4.1    TUMOR MARKERS: No results for input(s): "AFPTM", "CEA", "CA199", "CHROMGRNA" in the last 8760 hours.  Assessment and Plan: 57 y.o. female smoker with past medical history significant for alcoholic hepatitis, cirrhosis/coronary artery disease by imaging ,anxiety, depression, arthritis, bipolar disorder, COPD, GERD, hepatitis C, and stage IIIa right lung adenocarcinoma diagnosed in 2021, status post right lower lobectomy and lymph node excision.  She presents now with left chest wall pain and recent PET scan revealing: 1. The cavitary right middle lobe nodule of concern on recent CT is hypermetabolic for size and remains suspicious for bronchogenic carcinoma (metastatic disease versus new primary). 2. 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 No other  definite osseous metastases identified. However, there is new prominent nearly symmetric metabolic activity posteromedially within all of the ribs bilaterally which may be stress-related. Early metastases would be difficult to exclude. 3. No evidence of extra thoracic metastatic disease. 4. Morphologic changes of cirrhosis and nonobstructing left renal calculus. 5.  Aortic Atherosclerosis  She is scheduled today for  CT-guided left fifth rib lesion biopsy.Risks and benefits of procedure was discussed with the patient  including, but not limited to bleeding, infection, damage to adjacent structures/pneumothorax or low yield requiring additional tests.  All of the questions were answered and there is agreement to proceed.  Consent signed and in chart.  LABS PENDING  Thank you for this interesting consult.  I greatly enjoyed meeting Rachel Gross and look forward to participating in their care.  A copy of this report was sent to the requesting provider on this date.  Electronically Signed: D. Rowe Robert, PA-C 02/06/2022, 8:33 AM   I spent a total of  25 minutes   in face to face in clinical consultation, greater than 50% of which was counseling/coordinating care for CT-guided left fifth rib lesion biopsy

## 2022-02-06 NOTE — Sedation Documentation (Signed)
Sample obtained

## 2022-02-06 NOTE — Procedures (Signed)
Interventional Radiology Procedure Note  Procedure: CT biopsy of left rib lesion  Complications: None  Estimated Blood Loss: None  Recommendations: - Bedrest x 1 hr - DC home   Signed,  Criselda Peaches, MD

## 2022-02-06 NOTE — Discharge Instructions (Signed)
Discharge Instructions:   Please call Interventional Radiology clinic [phone] with any questions or concerns.  You may remove your bandaid and shower tomorrow.  Moderate Conscious Sedation, Adult, Care After This sheet gives you information about how to care for yourself after your procedure. Your health care provider may also give you more specific instructions. If you have problems or questions, contact your health care provider. What can I expect after the procedure? After the procedure, it is common to have: Sleepiness for several hours. Impaired judgment for several hours. Difficulty with balance. Vomiting if you eat too soon. Follow these instructions at home: For the time period you were told by your health care provider: Rest. Do not participate in activities where you could fall or become injured. Do not drive or use machinery. Do not drink alcohol. Do not take sleeping pills or medicines that cause drowsiness. Do not make important decisions or sign legal documents. Do not take care of children on your own. Eating and drinking  Follow the diet recommended by your health care provider. Drink enough fluid to keep your urine pale yellow. If you vomit: Drink water, juice, or soup when you can drink without vomiting. Make sure you have little or no nausea before eating solid foods. General instructions Take over-the-counter and prescription medicines only as told by your health care provider. Have a responsible adult stay with you for the time you are told. It is important to have someone help care for you until you are awake and alert. Do not smoke. Keep all follow-up visits as told by your health care provider. This is important. Contact a health care provider if: You are still sleepy or having trouble with balance after 24 hours. You feel light-headed. You keep feeling nauseous or you keep vomiting. You develop a rash. You have a fever. You have redness or  swelling around the IV site. Get help right away if: You have trouble breathing. You have new-onset confusion at home. Summary After the procedure, it is common to feel sleepy, have impaired judgment, or feel nauseous if you eat too soon. Rest after you get home. Know the things you should not do after the procedure. Follow the diet recommended by your health care provider and drink enough fluid to keep your urine pale yellow. Get help right away if you have trouble breathing or new-onset confusion at home. This information is not intended to replace advice given to you by your health care provider. Make sure you discuss any questions you have with your health care provider. Document Revised: 05/16/2019 Document Reviewed: 12/12/2018 Elsevier Patient Education  [place].   Needle Biopsy, Care After The following information offers guidance on how to care for yourself after your procedure. Your health care provider may also give you more specific instructions. If you have problems or questions, contact your health care provider. What can I expect after the procedure? After the procedure, it is common to have: Soreness, pain, and tenderness where a tissue sample was taken (biopsy site). Bruising or mild pain at the biopsy site. These symptoms should go away after a few days. Follow these instructions at home: Biopsy site care  Follow instructions from your health care provider about how to take care of your biopsy site. Make sure you: Wash your hands with soap and water for at least 20 seconds before and after you change your bandage (dressing). If soap and water are not available, use hand sanitizer. Know when and how to change  your dressing. Know when to remove your dressing. Check your puncture site every day for signs of infection. Check for: More redness, swelling, or pain. More drainage of fluid or blood. More warmth. Pus or a bad smell. General instructions Rest as  told by your health care provider. Do not take baths, swim, or use a hot tub until your health care provider approves. Ask your health care provider if you may take showers. You may only be allowed to take sponge baths. Take over-the-counter and prescription medicines only as told by your health care provider. Return to your normal activities as told by your health care provider. Ask your health care provider what activities are safe for you. If you have airplane travel scheduled, talk with your health care provider about when it is safe for you to travel by airplane. This is specific to certain biopsy procedures. It is up to you to get the results of your procedure. Ask your health care provider, or the department that is doing the procedure, when your results will be ready. Keep all follow-up visits. You may need to make an appointment to get your biopsy results. Contact a health care provider if: You have a fever. You have more redness, swelling, or pain at the puncture site that lasts longer than a few days. You have more fluid or blood coming from your puncture site. You have pus or a bad smell coming from your puncture site. Your puncture site feels warm to the touch. You have pain that does not get better with medicine. Get help right away if: You have severe bleeding from the puncture site. You have chest pain. You have problems breathing. You cough up blood. You faint. You have a very fast heart rate. These symptoms may be an emergency. Get help right away. Call 911. Do not wait to see if the symptoms will go away. Do not drive yourself to the hospital. Summary After the procedure, it is common to have soreness, bruising, tenderness, or mild pain at the biopsy site. These symptoms should go away in a few days. Check your biopsy site every day for signs of infection, such as more redness, swelling, or pain. Do not take baths, swim, or use a hot tub until your health care provider  approves. Ask your health care provider if you may take showers. Contact a heath care provider if you have more redness, swelling, or pain at the puncture site that lasts longer than a few days. This information is not intended to replace advice given to you by your health care provider. Make sure you discuss any questions you have with your health care provider. Document Revised: 01/12/2021 Document Reviewed: 01/12/2021 Elsevier Patient Education  [place].

## 2022-02-07 ENCOUNTER — Encounter: Payer: Self-pay | Admitting: Internal Medicine

## 2022-02-07 LAB — SURGICAL PATHOLOGY

## 2022-02-13 ENCOUNTER — Inpatient Hospital Stay: Payer: Medicaid Other | Attending: Hematology | Admitting: Hematology

## 2022-02-13 VITALS — BP 112/64 | HR 88 | Temp 98.2°F | Resp 18 | Ht 61.0 in | Wt 151.5 lb

## 2022-02-13 DIAGNOSIS — C3491 Malignant neoplasm of unspecified part of right bronchus or lung: Secondary | ICD-10-CM

## 2022-02-13 DIAGNOSIS — K7460 Unspecified cirrhosis of liver: Secondary | ICD-10-CM | POA: Insufficient documentation

## 2022-02-13 DIAGNOSIS — C7951 Secondary malignant neoplasm of bone: Secondary | ICD-10-CM | POA: Diagnosis not present

## 2022-02-13 DIAGNOSIS — C3431 Malignant neoplasm of lower lobe, right bronchus or lung: Secondary | ICD-10-CM | POA: Insufficient documentation

## 2022-02-13 DIAGNOSIS — D696 Thrombocytopenia, unspecified: Secondary | ICD-10-CM | POA: Diagnosis not present

## 2022-02-13 MED ORDER — TRAMADOL HCL 50 MG PO TABS: 50.0000 mg | ORAL_TABLET | Freq: Every day | ORAL | 0 refills | Status: DC | PRN

## 2022-02-13 NOTE — Progress Notes (Signed)
[facility] [address] [place] [address]   CLINIC:  Medical Oncology/Hematology  PCP:  Benita Stabile, MD [address] Laurey Morale [place] [address] [phone]   REASON FOR VISIT:  Follow-up for stage III right lung cancer  PRIOR THERAPY:  1. Right lower lobectomy on 06/19/2019. 2. Carboplatin and pemetrexed x 4 cycles from 09/09/2019 to 11/11/2019.  NGS Results: PD-L1 30%, Foundation 1 MS--stable, TMB 9 Muts/Mb  CURRENT THERAPY: surveillance  BRIEF ONCOLOGIC HISTORY:  Oncology History  Adenocarcinoma of lung, stage 3, right (HCC)  07/02/2019 Initial Diagnosis   Adenocarcinoma of lung, stage 3, right (HCC)   07/18/2019 Genetic Testing   Foundation One     07/23/2019 Genetic Testing   PD-L1     09/09/2019 - 11/11/2019 Chemotherapy   Patient is on Treatment Plan : LUNG NSCLC Pemetrexed (Alimta) / Carboplatin q21d x 4 cycles       CANCER STAGING:  Cancer Staging  No matching staging information was found for the patient.  INTERVAL HISTORY:  [name], a 57 y.o. female, seen for follow-up of metastatic lung cancer.  She underwent biopsy of the left rib lytic lesion on 02/06/2022.  She reports decrease in energy and appetite levels.  She has taken tramadol which helped.  But she apparently received only 5 pills from the pharmacy.  REVIEW OF SYSTEMS:  Review of Systems  Constitutional:  Negative for appetite change and fatigue.  HENT:   Negative for nosebleeds.   Respiratory:  Positive for cough and shortness of breath. Negative for hemoptysis.   Cardiovascular:  Positive for chest pain (Left rib cage).  Gastrointestinal:  Negative for blood in stool.  Genitourinary:  Negative for hematuria.   Neurological:  Positive for headaches.  All other systems reviewed and are negative.   PAST MEDICAL/SURGICAL HISTORY:  Past Medical History:  Diagnosis Date   Alcoholic hepatitis without ascites    Anxiety    Arthritis    knees, hands    Atypical squamous cell of undetermined significance of cervix    Bipolar disorder (HCC)    Cancer (HCC)    right lung   COPD (chronic obstructive pulmonary disease) (HCC)    Depression    Dyspnea    Emphysema lung (HCC)    GERD (gastroesophageal reflux disease)    Hepatitis C    s/p treatment with Epclusa   History of HPV infection    History of pneumonia    Pneumonia    Smoker    Past Surgical History:  Procedure Laterality Date   BIOPSY  05/02/2018   Procedure: BIOPSY;  Surgeon: Corbin Ade, MD;  [facility];  Service: Endoscopy;;  gastric   CESAREAN SECTION     CHEST TUBE INSERTION Right 06/26/2019   Procedure: Right CHEST TUBE REPLACEMENT;  Surgeon: Loreli Slot, MD;  Location: [facility] OR;  Service: Thoracic;  Laterality: Right;   COLONOSCOPY WITH PROPOFOL N/A 03/03/2019   Procedure: COLONOSCOPY WITH PROPOFOL;  Surgeon: Corbin Ade, MD;  [facility];  Service: Endoscopy;  Laterality: N/A;  9:15am   ESOPHAGOGASTRODUODENOSCOPY (EGD) WITH PROPOFOL N/A 05/02/2018   Procedure: ESOPHAGOGASTRODUODENOSCOPY (EGD) WITH PROPOFOL;  Surgeon: Corbin Ade, MD;  [facility];  Service: Endoscopy;  Laterality: N/A;  8:30am   EYE SURGERY Bilateral    cataract   HEMOSTASIS CLIP PLACEMENT  03/03/2019   Procedure: HEMOSTASIS CLIP PLACEMENT;  Surgeon: Corbin Ade, MD;  [facility];  Service: Endoscopy;;   INTERCOSTAL NERVE BLOCK Right 06/19/2019   Procedure: Intercostal Nerve Block;  Surgeon: Loreli Slot, MD;  Location: [facility] OR;  Service: Thoracic;  Laterality: Right;   IR US GUIDE VASC ACCESS RIGHT  03/13/2018   LOBECTOMY     LYMPH NODE DISSECTION Right 06/19/2019   Procedure: Lymph Node Dissection;  Surgeon: Loreli Slot, MD;  Location: [facility] OR;  Service: Thoracic;  Laterality: Right;   POLYPECTOMY  03/03/2019   Procedure: POLYPECTOMY;  Surgeon: Corbin Ade, MD;  [facility];  Service: Endoscopy;;   PORTACATH  PLACEMENT Left 09/08/2019   Procedure: INSERTION PORT-A-CATH LEFT CHEST (attached catheter in left subclavian);  Surgeon: Franky Macho, MD;  Location: [facility];  Service: General;  Laterality: Left;   TONSILLECTOMY     TOOTH EXTRACTION  01/31/2018   x 7   VIDEO BRONCHOSCOPY WITH INSERTION OF INTERBRONCHIAL VALVE (IBV) N/A 06/26/2019   Procedure: VIDEO BRONCHOSCOPY WITH INSERTION OF TWO INTERBRONCHIAL VALVE (IBV);  Surgeon: Loreli Slot, MD;  Location: [facility] OR;  Service: Thoracic;  Laterality: N/A;   VIDEO BRONCHOSCOPY WITH INSERTION OF INTERBRONCHIAL VALVE (IBV) N/A 08/08/2019   Procedure: VIDEO BRONCHOSCOPY WITH REMOVAL OF INTERBRONCHIAL VALVE (IBV);  Surgeon: Loreli Slot, MD;  Location: [facility] OR;  Service: Thoracic;  Laterality: N/A;    SOCIAL HISTORY:  Social History   Socioeconomic History   Marital status: Single    Spouse name: Not on file   Number of children: Not on file   Years of education: Not on file   Highest education level: Not on file  Occupational History   Not on file  Tobacco Use   Smoking status: Former    Packs/day: 0.50    Years: 38.00    Total pack years: 19.00    Types: Cigarettes    Quit date: 06/19/2019    Years since quitting: 2.6   Smokeless tobacco: Never  Vaping Use   Vaping Use: Never used  Substance and Sexual Activity   Alcohol use: Not Currently    Comment: Last use of alcohol 03/2016   Drug use: Not Currently    Types: 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   Sexual activity: Not Currently  Other Topics Concern   Not on file  Social History Narrative   Not on file   Social Determinants of Health   Financial Resource Strain: Medium Risk (12/10/2019)   Overall Financial Resource Strain (CARDIA)    Difficulty of Paying Living Expenses: Somewhat hard  Food Insecurity: Food Insecurity Present (12/10/2019)   Hunger Vital Sign    Worried About Running Out of Food in the Last Year: Often true    Ran Out of Food in the Last Year: Often true   Transportation Needs: No Transportation Needs (12/10/2019)   PRAPARE - Administrator, Civil Service (Medical): No    Lack of Transportation (Non-Medical): No  Physical Activity: Sufficiently Active (12/10/2019)   Exercise Vital Sign    Days of Exercise per Week: 3 days    Minutes of Exercise per Session: 150+ min  Stress: Stress Concern Present (12/10/2019)   [employer] of Occupational Health - Occupational Stress Questionnaire    Feeling of Stress : Very much  Social Connections: Socially Isolated (12/10/2019)   Social Connection and Isolation Panel [NHANES]    Frequency of Communication with Friends and Family: More than three times a week    Frequency of Social Gatherings with Friends  and Family: Once a week    Attends Religious Services: Never    Active Member of Clubs or Organizations: No    Attends Banker Meetings: Never    Marital Status: Divorced  Catering manager Violence: Not At Risk (12/10/2019)   Humiliation, Afraid, Rape, and Kick questionnaire    Fear of Current or Ex-Partner: No    Emotionally Abused: No    Physically Abused: No    Sexually Abused: No    FAMILY HISTORY:  Family History  Problem Relation Age of Onset   Congenital heart disease Mother    Bipolar disorder Mother    Prostate cancer Brother    Alzheimer's disease Paternal Grandmother    Colon cancer Neg Hx     CURRENT MEDICATIONS:  Current Outpatient Medications  Medication Sig Dispense Refill   albuterol (VENTOLIN HFA) 108 (90 Base) MCG/ACT inhaler Inhale 2 puffs into the lungs every 4 (four) hours as needed for wheezing or shortness of breath.     dexamethasone (DECADRON) 6 MG tablet      diphenhydrAMINE (BENADRYL) 25 MG tablet Take 25 mg by mouth at bedtime.     divalproex (DEPAKOTE) 250 MG DR tablet Take 250 mg by mouth at bedtime.      folic acid (FOLVITE) 1 MG tablet Take 1 mg by mouth daily.     lidocaine-prilocaine (EMLA) cream Apply a small amount to  port a cath site and cover with plastic wrap 1 hour prior to chemotherapy appointments 30 g 3   ondansetron (ZOFRAN ODT) 8 MG disintegrating tablet Place 1 tablet under tongue every 8 hours as needed for nausea. You may begin using this medication on the third day after each chemotherapy treatment. 20 tablet 1   pantoprazole (PROTONIX) 40 MG tablet TAKE 1 TABLET BY MOUTH DAILY BEFORE AND BREAKFAST 90 tablet 1   risperiDONE (RISPERDAL) 2 MG tablet Take 2 mg by mouth at bedtime.      scopolamine (TRANSDERM-SCOP) 1 MG/3DAYS Place 1 patch (1.5 mg total) onto the skin every 3 (three) days. 10 patch 12   SYMBICORT 160-4.5 MCG/ACT inhaler Inhale 2 puffs into the lungs 2 (two) times daily. 1 each 6   traZODone (DESYREL) 100 MG tablet Take by mouth.     traMADol (ULTRAM) 50 MG tablet Take 1 tablet (50 mg total) by mouth daily as needed. 30 tablet 0   No current facility-administered medications for this visit.    ALLERGIES:  Allergies  Allergen Reactions   Folic Acid Anaphylaxis    Not entirely clear if it is from folic acid. She is taking folic acid now and doesn't have the throat swelling anymore   Penicillins Anaphylaxis    Throat swelled Did it involve swelling of the face/tongue/throat, SOB, or low BP? Yes Did it involve sudden or severe rash/hives, skin peeling, or any reaction on the inside of your mouth or nose? No Did you need to seek medical attention at a hospital or doctor's office? No When did it last happen?      childhood allergy If all above answers are "NO", may proceed with cephalosporin use.    Amoxicillin     hallucinations Did it involve swelling of the face/tongue/throat, SOB, or low BP? No Did it involve sudden or severe rash/hives, skin peeling, or any reaction on the inside of your mouth or nose? No Did you need to seek medical attention at a hospital or doctor's office? Yes When did it last happen?  July 2019 If all above answers are "NO", may proceed with  cephalosporin use.    Fish Allergy Nausea Only   Other Swelling    Patient states that the sunrise brand folic acid made her feel like her throat was swelling.     PHYSICAL EXAM:  Performance status (ECOG): 1 - Symptomatic but completely ambulatory  Vitals:   02/13/22 1156  BP: 112/64  Pulse: 88  Resp: 18  Temp: 98.2 F (36.8 C)  SpO2: 98%    Wt Readings from Last 3 Encounters:  02/13/22 151 lb 8 oz (68.7 kg)  02/06/22 155 lb (70.3 kg)  01/05/22 158 lb 11.2 oz (72 kg)   Physical Exam Vitals reviewed.  Constitutional:      Appearance: Normal appearance. She is obese.  Cardiovascular:     Rate and Rhythm: Normal rate and regular rhythm.     Pulses: Normal pulses.     Heart sounds: Normal heart sounds.  Pulmonary:     Effort: Pulmonary effort is normal.     Breath sounds: Normal breath sounds.  Neurological:     General: No focal deficit present.     Mental Status: She is alert and oriented to person, place, and time.  Psychiatric:        Mood and Affect: Mood normal.        Behavior: Behavior normal.      LABORATORY DATA:  I have reviewed the labs as listed.     Latest Ref Rng & Units 02/06/2022    9:00 AM 12/29/2021    1:11 PM 10/05/2021   11:46 AM  CBC  WBC 4.0 - 10.5 K/uL 8.5  9.8  10.0   Hemoglobin 12.0 - 15.0 g/dL 14.3  14.5  15.0   Hematocrit 36.0 - 46.0 % 43.3  43.9  43.8   Platelets 150 - 400 K/uL 185  211  193       Latest Ref Rng & Units 02/06/2022    9:00 AM 12/29/2021    1:11 PM 10/05/2021   11:46 AM  CMP  Glucose 70 - 99 mg/dL 96  109  102   BUN 6 - 20 mg/dL 8  8  5    Creatinine 0.44 - 1.00 mg/dL 0.56  0.56  0.56   Sodium 135 - 145 mmol/L 135  137  136   Potassium 3.5 - 5.1 mmol/L 3.6  3.5  3.5   Chloride 98 - 111 mmol/L 99  100  102   CO2 22 - 32 mmol/L 27  26  24    Calcium 8.9 - 10.3 mg/dL 8.8  9.3  8.8   Total Protein 6.5 - 8.1 g/dL  7.5  7.1   Total Bilirubin 0.3 - 1.2 mg/dL  0.8  0.8   Alkaline Phos 38 - 126 U/L  50  53   AST 15 - 41  U/L  21  30   ALT 0 - 44 U/L  13  24     DIAGNOSTIC IMAGING:  I have independently reviewed the scans and discussed with the patient. CT BONE TROCAR/NEEDLE BIOPSY SUPERFICIAL  Result Date: 02/06/2022 INDICATION: Hypermetabolic lesion in the left fifth rib. History of lung cancer. EXAM: CT-guided biopsy of left rib lesion MEDICATIONS: None. ANESTHESIA/SEDATION: Moderate (conscious) sedation was employed during this procedure. A total of Versed 1 mg and Fentanyl 50 mcg was administered intravenously by the radiology nurse. Total intra-service moderate Sedation Time: 10 minutes. The patient's level of consciousness and vital signs were monitored  continuously by radiology nursing throughout the procedure under my direct supervision. COMPLICATIONS: None immediate. PROCEDURE: Informed written consent was obtained from the patient after a thorough discussion of the procedural risks, benefits and alternatives. All questions were addressed. Maximal Sterile Barrier Technique was utilized including caps, mask, sterile gowns, sterile gloves, sterile drape, hand hygiene and skin antiseptic. A timeout was performed prior to the initiation of the procedure. CT imaging was performed. The bilobed lytic lesion arising from the lateral aspect of the left fifth rib was identified. A suitable skin entry site was selected and marked. The skin was prepped and draped in the standard fashion using chlorhexidine skin prep. Local anesthesia was attained by infiltration with 1% lidocaine. Under intermittent CT guidance, a 15 cm 17 gauge trocar needle was carefully advanced and positioned at the margin of the mass. Multiple 18 gauge core biopsies were then obtained coaxially and placed in formalin before being delivered to pathology for further analysis. The introducer needle was removed. Post biopsy CT imaging demonstrates no evidence of hematoma or pneumothorax or other complication. IMPRESSION: CT-guided biopsy of soft tissue lytic  lesion arising from the posterolateral aspect of the left fifth rib. Electronically Signed   By: Malachy Moan M.D.   On: 02/06/2022 11:28   DG Chest Port 1 View  Result Date: 02/06/2022 CLINICAL DATA:  Status post rib biopsy EXAM: PORTABLE CHEST 1 VIEW COMPARISON:  Chest x-ray 02/25/2021.  PET-CT scan 01/19/2022 FINDINGS: Left subclavian chest port with tip at the SVC right atrial junction region. The port is accessed. There is some linear opacity at the bases likely scar or atelectasis. No pleural effusion, pneumothorax or consolidation. No edema. Stable cardiopericardial silhouette. IMPRESSION: No pneumothorax post biopsy. Basilar atelectasis. Chest port Electronically Signed   By: Karen Kays M.D.   On: 02/06/2022 11:08   NM PET Image Restag (PS) Skull Base To Thigh  Result Date: 01/24/2022 CLINICAL DATA:  Subsequent treatment strategy for lung cancer status post right lower lobectomy and chemotherapy. Enlarging cavitary right upper lobe pulmonary nodule on CT. EXAM: NUCLEAR MEDICINE PET SKULL BASE TO THIGH TECHNIQUE: 8.47 mCi F-18 FDG was injected intravenously. Full-ring PET imaging was performed from the skull base to thigh after the radiotracer. CT data was obtained and used for attenuation correction and anatomic localization. Fasting blood glucose: 126 mg/dl COMPARISON:  Chest CT 58/00/6349 and 06/06/2021.  PET-CT 05/13/2019 FINDINGS: Mediastinal blood pool activity: SUV max 1.5 NECK: No hypermetabolic cervical lymph nodes are identified. No suspicious activity identified within the pharyngeal mucosal space. Incidental CT findings: none CHEST: There are no hypermetabolic mediastinal, hilar or axillary lymph nodes. Patient is status post right lower lobectomy. The cavitary nodule of concern on recent CT is located posteriorly in the right middle lobe, measuring 1.3 x 0.7 cm on image 57/7, and is hypermetabolic for size with an SUV max of 1.8. No other hypermetabolic or suspicious pulmonary  nodules are identified. Perifissural nodularity along the minor fissure is unchanged, without metabolic activity. Incidental CT findings: Postsurgical changes as noted above. Atherosclerosis of the aorta, great vessels and coronary arteries. ABDOMEN/PELVIS: There is no hypermetabolic activity within the liver, adrenal glands, spleen or pancreas. There is no hypermetabolic nodal activity in the abdomen or pelvis. Incidental CT findings: Morphologic changes of cirrhosis. Nonobstructing left renal calculus, mild diffuse colonic diverticulosis and aortic and branch vessel atherosclerosis are noted. There is a stable incidental lipoma within the 3rd portion of the duodenum. SKELETON: Expansile lytic metastasis involving the left 5th rib is  hypermetabolic. This lesion measures 2.6 x 1.9 cm on image 82/3 and has an SUV max of 4.2. No other definite hypermetabolic osseous metastases identified. However, there is new prominent nearly symmetric metabolic activity posteromedially within all of the ribs. This may be stress related based on the extent. Early metastases would be difficult to exclude. No vertebral body involvement. Incidental CT findings: none IMPRESSION: 1. The cavitary right middle lobe nodule of concern on recent CT is hypermetabolic for size and remains suspicious for bronchogenic carcinoma (metastatic disease versus new primary). 2. 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 No other definite osseous metastases identified. However, there is new prominent nearly symmetric metabolic activity posteromedially within all of the ribs bilaterally which may be stress-related. Early metastases would be difficult to exclude. 3. No evidence of extra thoracic metastatic disease. 4. Morphologic changes of cirrhosis and nonobstructing left renal calculus. 5.  Aortic Atherosclerosis (ICD10-I70.0). Electronically Signed   By: Carey Bullocks M.D.   On: 01/24/2022 11:32     ASSESSMENT:  1.  Stage  IIIa (PT1CPN2) right lung adenocarcinoma: -PET scan on 05/13/2019 showed right lower lobe nodule concerning for bronchogenic carcinoma.  Cirrhotic liver. -Right lower lobectomy and lymph node excision on 06/19/2019. -Pathology showed 1.7 cm right lower lobe adenocarcinoma, unifocal, lymphovascular invasion present.  Margins negative.  2/14 lymph nodes positive (level 7 and 10R).  PT1CPN2. -PD-L1 30%, K-ras G12A, MSI stable.  EGFR mutation not identified. -4 cycles of adjuvant carboplatin and pemetrexed from 09/09/2019 through 11/11/2019. - CT chest on 12/29/2021: Lytic metastatic lesion in the posterior left fifth rib.  Interval enlargement of cavitary nodule of the dependent right upper lobe.  Numerous new clustered nodules of varying sizes. - PET scan (01/19/2022): Cavitary nodule of concern in the right middle lobe measures 1.3 x 0.7 cm, SUV max of 1.8.  No other hypermetabolic or suspicious lung nodules.  Expansile lytic metastasis involving left fifth rib hypermetabolic measuring 2.6 x 1.9 cm with SUV 4.2. - Left rib biopsy (02/06/2022): Metastatic moderately to poorly differentiated adenocarcinoma  PLAN:  1.  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 - I have recommended NGS testing.  RTC 4 weeks for follow-up.  2.  Left chest wall pain: - Left lateral rib pain sharp in nature on lifting left arm and hurts slightly when she takes deep breaths. - I will send a prescription for tramadol 50 mg daily as needed. - I will make a referral to radiation oncology for pain control.  3.  Immune mediated thrombocytopenia: - She was previously treated for hepatitis C infection and also has cirrhosis. - Lately platelet count has normalized.    Orders placed this encounter:  No orders of  the defined types were placed in this encounter.     Doreatha Massed, MD [facility] [phone]

## 2022-02-13 NOTE — Patient Instructions (Signed)
[facility] at [facility] Discharge Instructions   You were seen and examined today by Dr. Ellin Saba.  He reviewed the results of your biopsy which shows the cancer you previously had is back and has spread to the rib.  We will arrange for you to have the rib lesion treated with radiation treatment in [place].   We will also send special testing on your biopsy to see what treatment options are.   We will see you back in about 3 weeks to go over the results.    Thank you for choosing [facility] at [facility] to provide your oncology and hematology care.  To afford each patient quality time with our provider, please arrive at least 15 minutes before your scheduled appointment time.   If you have a lab appointment with the Cancer Center please come in thru the Main Entrance and check in at the main information desk.  You need to re-schedule your appointment should you arrive 10 or more minutes late.  We strive to give you quality time with our providers, and arriving late affects you and other patients whose appointments are after yours.  Also, if you no show three or more times for appointments you may be dismissed from the clinic at the providers discretion.     Again, thank you for choosing [facility].  Our hope is that these requests will decrease the amount of time that you wait before being seen by our physicians.       _____________________________________________________________  Should you have questions after your visit to [facility], please contact our office at [phone] and follow the prompts.  Our office hours are 8:00 a.m. and 4:30 p.m. Monday - Friday.  Please note that voicemails left after 4:00 p.m. may not be returned until the following business day.  We are closed weekends and major holidays.  You do have access to a nurse 24-7, just call the main number to the clinic [phone] and do not press  any options, hold on the line and a nurse will answer the phone.    For prescription refill requests, have your pharmacy contact our office and allow 72 hours.    Due to Covid, you will need to wear a mask upon entering the hospital. If you do not have a mask, a mask will be given to you at the Main Entrance upon arrival. For doctor visits, patients may have 1 support person age 72 or older with them. For treatment visits, patients can not have anyone with them due to social distancing guidelines and our immunocompromised population.

## 2022-03-01 ENCOUNTER — Other Ambulatory Visit: Payer: Self-pay | Admitting: *Deleted

## 2022-03-02 ENCOUNTER — Other Ambulatory Visit: Payer: Self-pay | Admitting: Hematology

## 2022-03-02 ENCOUNTER — Telehealth: Payer: Self-pay | Admitting: *Deleted

## 2022-03-02 MED ORDER — TRAMADOL HCL 100 MG PO TABS: 100.0000 mg | ORAL_TABLET | Freq: Two times a day (BID) | ORAL | 0 refills | Status: DC | PRN

## 2022-03-02 NOTE — Telephone Encounter (Signed)
Patient called to advise that Tramadol 50 mg tablets are not effective.  Dr. Delton Coombes sent in new script for 100 mg tablets.  Patient aware.

## 2022-03-06 ENCOUNTER — Encounter: Payer: Self-pay | Admitting: Hematology

## 2022-03-06 ENCOUNTER — Inpatient Hospital Stay: Payer: Medicaid Other | Attending: Hematology | Admitting: Hematology

## 2022-03-06 ENCOUNTER — Inpatient Hospital Stay: Payer: Medicaid Other | Admitting: Dietician

## 2022-03-06 DIAGNOSIS — R0789 Other chest pain: Secondary | ICD-10-CM | POA: Insufficient documentation

## 2022-03-06 DIAGNOSIS — C3431 Malignant neoplasm of lower lobe, right bronchus or lung: Secondary | ICD-10-CM | POA: Insufficient documentation

## 2022-03-06 DIAGNOSIS — C7951 Secondary malignant neoplasm of bone: Secondary | ICD-10-CM | POA: Insufficient documentation

## 2022-03-06 DIAGNOSIS — C3491 Malignant neoplasm of unspecified part of right bronchus or lung: Secondary | ICD-10-CM | POA: Diagnosis not present

## 2022-03-06 DIAGNOSIS — Z87891 Personal history of nicotine dependence: Secondary | ICD-10-CM | POA: Diagnosis not present

## 2022-03-06 DIAGNOSIS — D696 Thrombocytopenia, unspecified: Secondary | ICD-10-CM | POA: Diagnosis not present

## 2022-03-06 MED ORDER — TRAMADOL HCL 100 MG PO TABS: 100.0000 mg | ORAL_TABLET | Freq: Two times a day (BID) | ORAL | 0 refills | Status: DC | PRN

## 2022-03-06 NOTE — Progress Notes (Signed)
Nutrition Assessment   Reason for Assessment: +MST   ASSESSMENT: 57 year old female with lung cancer metastatic to ribs. Patient currently under surveillance. She is planning to start palliative radiation to rib lesion.   Met with patient in clinic. She reports poor appetite secondary to pain. Patient shares that since the passing of her father of pancreatic cancer in December, she has been eating once daily. This is normally a bologna sandwich. Yesterday she went to [employer]. Patient reports food was cold and she broke a tooth on a french fry.    Medications: depakote, folic acid, zofran, protonix, risperdal, tramadol, trazodone   Labs: 1/8 reviewed    Anthropometrics: Weights have decreased ~12% (20 lbs) from usual weight in 10 months - insignificant for time frame however concerning   Height: 5'1" Weight: 151 lb 8 oz  UBW: 171 lb (04/26/21) BMI: 28.63    NUTRITION DIAGNOSIS: Unintentional weight loss related to cancer, social/environmental circumstances as evidenced by 12% decreased in usual wt    INTERVENTION:  Encouraged small frequent meals/snacks  Drink 1-2 Ensure Plus daily  Provided one case Ensure Plus HP Provided one bag of food from [facility] pantry Contact information given    MONITORING, EVALUATION, GOAL: Patient will tolerate adequate calories and protein to minimize further weight loss   Next Visit: To be scheduled as needed

## 2022-03-06 NOTE — Progress Notes (Signed)
[facility] [address], [place] [address]   CLINIC:  Medical Oncology/Hematology  PCP:  Celene Squibb, MD [address] [name] [address] [phone]   REASON FOR VISIT:  Follow-up for stage III right lung cancer  PRIOR THERAPY:  1. Right lower lobectomy on 06/19/2019. 2. Carboplatin and pemetrexed x 4 cycles from 09/09/2019 to 11/11/2019.  NGS Results: PD-L1 30%, Foundation 1 MS--stable, TMB 9 Muts/Mb  CURRENT THERAPY: surveillance  BRIEF ONCOLOGIC HISTORY:  Oncology History  Adenocarcinoma of lung, stage 3, right ([place])  07/02/2019 Initial Diagnosis   Adenocarcinoma of lung, stage 3, right ([place])   07/18/2019 Genetic Testing   Foundation One     07/23/2019 Genetic Testing   PD-L1     09/09/2019 - 11/11/2019 Chemotherapy   Patient is on Treatment Plan : LUNG NSCLC Pemetrexed ([place]) / Carboplatin q21d x 4 cycles     Non-small cell cancer of right lung ([facility])  02/13/2022 Initial Diagnosis   Non-small cell cancer of right lung ([facility])   02/13/2022 Cancer Staging   Staging form: Lung, AJCC 8th Edition - Clinical stage from 02/13/2022: Stage IV (cT1c, cN2, pM1) - Signed by Derek Jack, MD on 02/13/2022 Histopathologic type: Adenocarcinoma, NOS Stage prefix: Initial diagnosis Histologic grade (G): G3 Histologic grading system: 4 grade system     CANCER STAGING:  Cancer Staging  Non-small cell cancer of right lung [facility]) Staging form: Lung, AJCC 8th Edition - Clinical stage from 02/13/2022: Stage IV (cT1c, cN2, pM1) - Signed by Derek Jack, MD on 02/13/2022  INTERVAL HISTORY:  [name], a 57 y.o. female, seen for follow-up of metastatic right lung cancer.  She underwent biopsy of the left rib lytic lesion on 02/06/2022.  She was last seen by me on 02/13/22.  Today, she states that she is doing fair. Her back and left upper lateral chest wall pain is an 8/10 in severity today. She has not yet picked up the  Tramadol since we sent in the Rx on 03/02/22 as [facility] did not have it. Her appetite level is at 0%. Her energy level is at 0%.  She states that the current plan is for her to have 5 radiation treatments with Dr. Sharyn Dross.  REVIEW OF SYSTEMS:  Review of Systems  Constitutional:  Positive for appetite change and fatigue. Negative for chills and fever.  HENT:   Negative for lump/mass, mouth sores, nosebleeds, sore throat and trouble swallowing.   Eyes:  Negative for eye problems.  Respiratory:  Positive for cough and shortness of breath.   Cardiovascular:  Negative for chest pain, leg swelling and palpitations.  Gastrointestinal:  Positive for constipation. Negative for abdominal pain, diarrhea, nausea and vomiting.  Genitourinary:  Negative for bladder incontinence, difficulty urinating, dysuria, frequency, hematuria and nocturia.   Musculoskeletal:  Positive for back pain and myalgias (left upper lateral chest pain). Negative for arthralgias, flank pain and neck pain.  Skin:  Negative for itching and rash.  Neurological:  Negative for dizziness, headaches and numbness.  Hematological:  Does not bruise/bleed easily.  Psychiatric/Behavioral:  Negative for depression, sleep disturbance and suicidal ideas. The patient is nervous/anxious.   All other systems reviewed and are negative.   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    Bipolar  disorder ([facility])    Cancer ([place] [facility])    right lung   COPD (chronic obstructive pulmonary disease) (H[facility])    Depression    Dyspnea    Emphysema lung (H[facility])    GERD (gastroesophageal reflux disease)    Hepatitis C    s/p treatment with Epclusa   History of HPV infection    History of pneumonia    Pneumonia    Smoker    Past Surgical History:  Procedure Laterality Date   BIOPSY   05/02/2018   Procedure: BIOPSY;  Surgeon: Daneil Dolin, MD;  [facility];  Service: Endoscopy;;  gastric   CESAREAN SECTION     CHEST TUBE INSERTION Right 06/26/2019   Procedure: Right CHEST TUBE REPLACEMENT;  Surgeon: [name], MD;  Location: [facility];  Service: Thoracic;  Laterality: Right;   COLONOSCOPY WITH PROPOFOL N/A 03/03/2019   Procedure: COLONOSCOPY WITH PROPOFOL;  Surgeon: Daneil Dolin, MD;  [facility];  Service: Endoscopy;  Laterality: N/A;  9:15am   ESOPHAGOGASTRODUODENOSCOPY (EGD) WITH PROPOFOL N/A 05/02/2018   Procedure: ESOPHAGOGASTRODUODENOSCOPY (EGD) WITH PROPOFOL;  Surgeon: Daneil Dolin, MD;  [facility];  Service: Endoscopy;  Laterality: N/A;  8:30am   EYE SURGERY Bilateral    cataract   HEMOSTASIS CLIP PLACEMENT  03/03/2019   Procedure: HEMOSTASIS CLIP PLACEMENT;  Surgeon: Daneil Dolin, MD;  [facility];  Service: Endoscopy;;   INTERCOSTAL NERVE BLOCK Right 06/19/2019   Procedure: Intercostal Nerve Block;  Surgeon: [name], MD;  Location: [facility];  Service: Thoracic;  Laterality: Right;   IR US GUIDE VASC ACCESS RIGHT  03/13/2018   LOBECTOMY     LYMPH NODE DISSECTION Right 06/19/2019   Procedure: Lymph Node Dissection;  Surgeon: [name], MD;  Location: [facility];  Service: Thoracic;  Laterality: Right;   POLYPECTOMY  03/03/2019   Procedure: POLYPECTOMY;  Surgeon: Daneil Dolin, MD;  [facility];  Service: Endoscopy;;   PORTACATH PLACEMENT Left 09/08/2019   Procedure: INSERTION PORT-A-CATH LEFT CHEST (attached catheter in left subclavian);  Surgeon: Aviva Signs, MD;  Location[facility];  Service: General;  Laterality: Left;   TONSILLECTOMY     TOOTH EXTRACTION  01/31/2018   x 7   VIDEO BRONCHOSCOPY WITH INSERTION OF INTERBRONCHIAL VALVE (IBV) N/A 06/26/2019   Procedure: VIDEO BRONCHOSCOPY WITH INSERTION OF TWO INTERBRONCHIAL VALVE (IBV);  Surgeon: [name], MD;   Location: [facility];  Service: Thoracic;  Laterality: N/A;   VIDEO BRONCHOSCOPY WITH INSERTION OF INTERBRONCHIAL VALVE (IBV) N/A 08/08/2019   Procedure: VIDEO BRONCHOSCOPY WITH REMOVAL OF INTERBRONCHIAL VALVE (IBV);  Surgeon: [name], MD;  Location[facility];  Service: Thoracic;  Laterality: N/A;    SOCIAL HISTORY:  Social History   Socioeconomic History   Marital status: Single    Spouse name: Not on file   Number of children: Not on file   Years of education: Not on file   Highest education level: Not on file  Occupational History   Not on file  Tobacco Use   Smoking status: Former    Packs/day: 0.50    Years: 38.00    Total pack years: 19.00    Types: Cigarettes    Quit date: 06/19/2019    Years since quitting: 2.7   Smokeless tobacco: Never  Vaping Use   Vaping Use: Never used  Substance and Sexual Activity   Alcohol use: Not Currently    Comment:  Last use of alcohol 03/2016   Drug use: Not Currently    Types: 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   Sexual activity: Not Currently  Other Topics Concern   Not on file  Social History Narrative   Not on file   Social Determinants of Health   Financial Resource Strain: Medium Risk (12/10/2019)   Overall Financial Resource Strain (CARDIA)    Difficulty of Paying Living Expenses: Somewhat hard  Food Insecurity: Food Insecurity Present (12/10/2019)   Hunger Vital Sign    Worried About Running Out of Food in the Last Year: Often true    Ran Out of Food in the Last Year: Often true  Transportation Needs: No Transportation Needs (12/10/2019)   PRAPARE - Hydrologist (Medical): No    Lack of Transportation (Non-Medical): No  Physical Activity: Sufficiently Active (12/10/2019)   Exercise Vital Sign    Days of Exercise per Week: 3 days    Minutes of Exercise per Session: 150+ min  Stress: Stress Concern Present (12/10/2019)   [place]     Feeling of Stress : Very much  Social Connections: Socially Isolated (12/10/2019)   Social Connection and Isolation Panel [NHANES]    Frequency of Communication with Friends and Family: More than three times a week    Frequency of Social Gatherings with Friends and Family: Once a week    Attends Religious Services: Never    Marine scientist or Organizations: No    Attends Archivist Meetings: Never    Marital Status: Divorced  Human resources officer Violence: Not At Risk (12/10/2019)   Humiliation, Afraid, Rape, and Kick questionnaire    Fear of Current or Ex-Partner: No    Emotionally Abused: No    Physically Abused: No    Sexually Abused: No    FAMILY HISTORY:  Family History  Problem Relation Age of Onset   Congenital heart disease Mother    Bipolar disorder Mother    Prostate cancer Brother    Alzheimer's disease Paternal Grandmother    Colon cancer Neg Hx     CURRENT MEDICATIONS:  Current Outpatient Medications  Medication Sig Dispense Refill   albuterol (VENTOLIN HFA) 108 (90 Base) MCG/ACT inhaler Inhale 2 puffs into the lungs every 4 (four) hours as needed for wheezing or shortness of breath.     dexamethasone (DECADRON) 6 MG tablet      diphenhydrAMINE (BENADRYL) 25 MG tablet Take 25 mg by mouth at bedtime.     divalproex (DEPAKOTE) 250 MG DR tablet Take 250 mg by mouth at bedtime.      folic acid (FOLVITE) 1 MG tablet Take 1 mg by mouth daily.     lidocaine-prilocaine (EMLA) cream Apply a small amount to port a cath site and cover with plastic wrap 1 hour prior to chemotherapy appointments 30 g 3   ondansetron (ZOFRAN ODT) 8 MG disintegrating tablet Place 1 tablet under tongue every 8 hours as needed for nausea. You may begin using this medication on the third day after each chemotherapy treatment. 20 tablet 1   pantoprazole (PROTONIX) 40 MG tablet TAKE 1 TABLET BY MOUTH DAILY BEFORE AND BREAKFAST 90 tablet 1   risperiDONE (RISPERDAL) 2 MG tablet Take 2 mg  by mouth at bedtime.      scopolamine (TRANSDERM-SCOP) 1 MG/3DAYS Place 1 patch (1.5 mg total) onto the skin every 3 (three) days. 10 patch 12  SYMBICORT 160-4.5 MCG/ACT inhaler Inhale 2 puffs into the lungs 2 (two) times daily. 1 each 6   traMADol HCl 100 MG TABS Take 100 mg by mouth 2 (two) times daily as needed. 60 tablet 0   traZODone (DESYREL) 100 MG tablet Take by mouth.     No current facility-administered medications for this visit.    ALLERGIES:  Allergies  Allergen Reactions   Folic Acid Anaphylaxis    Not entirely clear if it is from folic acid. She is taking folic acid now and doesn't have the throat swelling anymore   Penicillins Anaphylaxis    Throat swelled Did it involve swelling of the face/tongue/throat, SOB, or low BP? Yes Did it involve sudden or severe rash/hives, skin peeling, or any reaction on the inside of your mouth or nose? No Did you need to seek medical attention at a hospital or doctor's office? No When did it last happen?      childhood allergy If all above answers are "NO", may proceed with cephalosporin use.    Amoxicillin     hallucinations Did it involve swelling of the face/tongue/throat, SOB, or low BP? No Did it involve sudden or severe rash/hives, skin peeling, or any reaction on the inside of your mouth or nose? No Did you need to seek medical attention at a hospital or doctor's office? Yes When did it last happen?      July 2019 If all above answers are "NO", may proceed with cephalosporin use.    Fish Allergy Nausea Only   Other Swelling    Patient states that the sunrise brand folic acid made her feel like her throat was swelling.     PHYSICAL EXAM:  Performance status (ECOG): 1 - Symptomatic but completely ambulatory  There were no vitals filed for this visit.   Wt Readings from Last 3 Encounters:  02/13/22 68.7 kg (151 lb 8 oz)  02/06/22 70.3 kg (155 lb)  01/05/22 72 kg (158 lb 11.2 oz)   Physical Exam Vitals reviewed. Exam  conducted with a chaperone present.  Constitutional:      Appearance: Normal appearance.  Cardiovascular:     Rate and Rhythm: Normal rate and regular rhythm.     Pulses: Normal pulses.     Heart sounds: Normal heart sounds.  Pulmonary:     Effort: Pulmonary effort is normal.     Breath sounds: Normal breath sounds.  Abdominal:     Palpations: Abdomen is soft. There is no hepatomegaly, splenomegaly or mass.     Tenderness: There is no abdominal tenderness.  Lymphadenopathy:     Upper Body:     Right upper body: No supraclavicular, axillary or pectoral adenopathy.     Left upper body: No supraclavicular, axillary or pectoral adenopathy.  Neurological:     General: No focal deficit present.     Mental Status: She is alert and oriented to person, place, and time.  Psychiatric:        Mood and Affect: Mood normal.        Behavior: Behavior normal.      LABORATORY DATA:  I have reviewed the labs as listed.     Latest Ref Rng & Units 02/06/2022    9:00 AM 12/29/2021    1:11 PM 10/05/2021   11:46 AM  CBC  WBC 4.0 - 10.5 K/uL 8.5  9.8  10.0   Hemoglobin 12.0 - 15.0 g/dL 14.3  14.5  15.0   Hematocrit 36.0 - 46.0 % 43.3  43.9  43.8   Platelets 150 - 400 K/uL 185  211  193       Latest Ref Rng & Units 02/06/2022    9:00 AM 12/29/2021    1:11 PM 10/05/2021   11:46 AM  CMP  Glucose 70 - 99 mg/dL 96  109  102   BUN 6 - 20 mg/dL 8  8  5    Creatinine 0.44 - 1.00 mg/dL 0.56  0.56  0.56   Sodium 135 - 145 mmol/L 135  137  136   Potassium 3.5 - 5.1 mmol/L 3.6  3.5  3.5   Chloride 98 - 111 mmol/L 99  100  102   CO2 22 - 32 mmol/L 27  26  24    Calcium 8.9 - 10.3 mg/dL 8.8  9.3  8.8   Total Protein 6.5 - 8.1 g/dL  7.5  7.1   Total Bilirubin 0.3 - 1.2 mg/dL  0.8  0.8   Alkaline Phos 38 - 126 U/L  50  53   AST 15 - 41 U/L  21  30   ALT 0 - 44 U/L  13  24     DIAGNOSTIC IMAGING:  I have independently reviewed the scans and discussed with the patient. CT BONE TROCAR/NEEDLE BIOPSY  SUPERFICIAL  Result Date: 02/06/2022 INDICATION: Hypermetabolic lesion in the left fifth rib. History of lung cancer. EXAM: CT-guided biopsy of left rib lesion MEDICATIONS: None. ANESTHESIA/SEDATION: Moderate (conscious) sedation was employed during this procedure. A total of Versed 1 mg and Fentanyl 50 mcg was administered intravenously by the radiology nurse. Total intra-service moderate Sedation Time: 10 minutes. The patient's level of consciousness and vital signs were monitored continuously by radiology nursing throughout the procedure under my direct supervision. COMPLICATIONS: None immediate. PROCEDURE: Informed written consent was obtained from the patient after a thorough discussion of the procedural risks, benefits and alternatives. All questions were addressed. Maximal Sterile Barrier Technique was utilized including caps, mask, sterile gowns, sterile gloves, sterile drape, hand hygiene and skin antiseptic. A timeout was performed prior to the initiation of the procedure. CT imaging was performed. The bilobed lytic lesion arising from the lateral aspect of the left fifth rib was identified. A suitable skin entry site was selected and marked. The skin was prepped and draped in the standard fashion using chlorhexidine skin prep. Local anesthesia was attained by infiltration with 1% lidocaine. Under intermittent CT guidance, a 15 cm 17 gauge trocar needle was carefully advanced and positioned at the margin of the mass. Multiple 18 gauge core biopsies were then obtained coaxially and placed in formalin before being delivered to pathology for further analysis. The introducer needle was removed. Post biopsy CT imaging demonstrates no evidence of hematoma or pneumothorax or other complication. IMPRESSION: CT-guided biopsy of soft tissue lytic lesion arising from the posterolateral aspect of the left fifth rib. Electronically Signed   By: Jacqulynn Cadet M.D.   On: 02/06/2022 11:28   DG Chest Port 1  View  Result Date: 02/06/2022 CLINICAL DATA:  Status post rib biopsy EXAM: PORTABLE CHEST 1 VIEW COMPARISON:  Chest x-ray 02/25/2021.  PET-CT scan 01/19/2022 FINDINGS: Left subclavian chest port with tip at the SVC right atrial junction region. The port is accessed. There is some linear opacity at the bases likely scar or atelectasis. No pleural effusion, pneumothorax or consolidation. No edema. Stable cardiopericardial silhouette. IMPRESSION: No pneumothorax post biopsy. Basilar atelectasis. Chest port Electronically Signed   By: Jill Side M.D.   On:  02/06/2022 11:08     ASSESSMENT:  1.  Stage IIIa (PT1CPN2) right lung adenocarcinoma: -PET scan on 05/13/2019 showed right lower lobe nodule concerning for bronchogenic carcinoma.  Cirrhotic liver. -Right lower lobectomy and lymph node excision on 06/19/2019. -Pathology showed 1.7 cm right lower lobe adenocarcinoma, unifocal, lymphovascular invasion present.  Margins negative.  2/14 lymph nodes positive (level 7 and 10R).  PT1CPN2. -PD-L1 30%, K-ras G12A, MSI stable.  EGFR mutation not identified. -4 cycles of adjuvant carboplatin and pemetrexed from 09/09/2019 through 11/11/2019. - CT chest on 12/29/2021: Lytic metastatic lesion in the posterior left fifth rib.  Interval enlargement of cavitary nodule of the dependent right upper lobe.  Numerous new clustered nodules of varying sizes. - PET scan (01/19/2022): Cavitary nodule of concern in the right middle lobe measures 1.3 x 0.7 cm, SUV max of 1.8.  No other hypermetabolic or suspicious lung nodules.  Expansile lytic metastasis involving left fifth rib hypermetabolic measuring 2.6 x 1.9 cm with SUV 4.2. - Left rib biopsy (02/06/2022): Metastatic moderately to poorly differentiated adenocarcinoma - NGS by [facility]: PD-L1 (22 C3)-TPS 5%.  TMB-high.  K-ras G12 A pathogenic variant.  T p53 pathogenic variant.  Other targetable mutations negative.  MSI-stable.  PLAN:  1.  Metastatic adenocarcinoma of the lung to  the left rib: - We have discussed NGS results in detail. - She was seen by Dr. Lynnette Caffey of radiation oncology.  XRT to start on 03/13/2022. - I have requested him to give  definitive dose. - She has a small right lung nodule which she is growing rather slowly.  We will continue to monitor it.  I have recommended follow-up in 3 months with repeat CT of the chest with contrast.  If the right lung nodule continues to grow, will consider SBRT.  2.  Left chest wall pain: - She has left lateral rib pain sharp in nature on lifting left arm and hurts slightly when she takes deep breaths. - Tramadol 50 mg did not help.  I have increased tramadol to 100 mg.  She has not filled the prescription yet.  Initial prescription was sent to [facility].  We will send it to [facility] at her request.  Hopefully radiation will help control the pain.  3.  Immune mediated thrombocytopenia: - She was previously treated for hep C infection and has cirrhosis.  Lately platelet count was normal on 02/06/2022.    Orders placed this encounter:  No orders of the defined types were placed in this encounter.    I,Alexis Herring,acting as a Education administrator for [employer], MD.,have documented all relevant documentation on the behalf of Derek Jack, MD,as directed by  Derek Jack, MD while in the presence of Derek Jack, MD.  I, Derek Jack MD, have reviewed the above documentation for accuracy and completeness, and I agree with the above.   Derek Jack, MD [place] [phone]

## 2022-03-06 NOTE — Patient Instructions (Addendum)
[facility]  Discharge Instructions  You were seen and examined today by Dr. Delton Coombes.  Proceed with radiation as planned. We will continue to watch your cancer. We will not be starting any further treatment at this time.  Follow-up as scheduled.  Thank you for choosing [facility] to provide your oncology and hematology care.   To afford each patient quality time with our provider, please arrive at least 15 minutes before your scheduled appointment time. You may need to reschedule your appointment if you arrive late (10 or more minutes). Arriving late affects you and other patients whose appointments are after yours.  Also, if you miss three or more appointments without notifying the office, you may be dismissed from the clinic at the provider's discretion.    Again, thank you for choosing [facility].  Our hope is that these requests will decrease the amount of time that you wait before being seen by our physicians.   If you have a lab appointment with the [facility] please come in thru the Main Entrance and check in at the main information desk.           _____________________________________________________________  Should you have questions after your visit to [facility], please contact our office at [phone] and follow the prompts.  Our office hours are 8:00 a.m. to 4:30 p.m. Monday - Thursday and 8:00 a.m. to 2:30 p.m. Friday.  Please note that voicemails left after 4:00 p.m. may not be returned until the following business day.  We are closed weekends and all major holidays.  You do have access to a nurse 24-7, just call the main number to the clinic [phone] and do not press any options, hold on the line and a nurse will answer the phone.    For prescription refill requests, have your pharmacy contact our office and allow 72 hours.    Masks are optional in the cancer centers. If you would  like for your care team to wear a mask while they are taking care of you, please let them know. You may have one support person who is at least 59 years old accompany you for your appointments.

## 2022-03-07 ENCOUNTER — Other Ambulatory Visit: Payer: Self-pay | Admitting: Gastroenterology

## 2022-04-25 ENCOUNTER — Other Ambulatory Visit: Payer: Self-pay | Admitting: Hematology

## 2022-05-29 ENCOUNTER — Inpatient Hospital Stay: Payer: Medicaid Other

## 2022-05-29 ENCOUNTER — Ambulatory Visit (HOSPITAL_COMMUNITY)
Admission: RE | Admit: 2022-05-29 | Discharge: 2022-05-29 | Disposition: A | Discharge status: Home / Self Care | Payer: Medicaid Other | Source: Ambulatory Visit | Attending: Hematology | Admitting: Hematology

## 2022-05-29 ENCOUNTER — Inpatient Hospital Stay: Payer: Medicaid Other | Attending: Hematology

## 2022-05-29 DIAGNOSIS — C7951 Secondary malignant neoplasm of bone: Secondary | ICD-10-CM | POA: Insufficient documentation

## 2022-05-29 DIAGNOSIS — D693 Immune thrombocytopenic purpura: Secondary | ICD-10-CM

## 2022-05-29 DIAGNOSIS — C3491 Malignant neoplasm of unspecified part of right bronchus or lung: Secondary | ICD-10-CM | POA: Insufficient documentation

## 2022-05-29 DIAGNOSIS — C3431 Malignant neoplasm of lower lobe, right bronchus or lung: Secondary | ICD-10-CM | POA: Diagnosis present

## 2022-05-29 LAB — COMPREHENSIVE METABOLIC PANEL
ALT: 14 U/L (ref 0–44)
AST: 22 U/L (ref 15–41)
Albumin: 3.9 g/dL (ref 3.5–5.0)
Alkaline Phosphatase: 55 U/L (ref 38–126)
Anion gap: 11 (ref 5–15)
BUN: 6 mg/dL (ref 6–20)
CO2: 25 mmol/L (ref 22–32)
Calcium: 8.8 mg/dL — ABNORMAL LOW (ref 8.9–10.3)
Chloride: 98 mmol/L (ref 98–111)
Creatinine, Ser: 0.48 mg/dL (ref 0.44–1.00)
GFR, Estimated: >60 mL/min (ref >60)
Glucose, Bld: 105 mg/dL — ABNORMAL HIGH (ref 70–99)
Potassium: 3.5 mmol/L (ref 3.5–5.1)
Sodium: 134 mmol/L — ABNORMAL LOW (ref 135–145)
Total Bilirubin: 0.5 mg/dL (ref 0.3–1.2)
Total Protein: 7.2 g/dL (ref 6.5–8.1)

## 2022-05-29 LAB — CBC WITH DIFFERENTIAL/PLATELET
Abs Immature Granulocytes: 0.03 10*3/uL (ref 0.00–0.07)
Basophils Absolute: 0 10*3/uL (ref 0.0–0.1)
Basophils Relative: 0 %
Eosinophils Absolute: 0 10*3/uL (ref 0.0–0.5)
Eosinophils Relative: 0 %
HCT: 39.7 % (ref 36.0–46.0)
Hemoglobin: 13.4 g/dL (ref 12.0–15.0)
Immature Granulocytes: 0 %
Lymphocytes Relative: 17 %
Lymphs Abs: 1.3 10*3/uL (ref 0.7–4.0)
MCH: 32.8 pg (ref 26.0–34.0)
MCHC: 33.8 g/dL (ref 30.0–36.0)
MCV: 97.3 fL (ref 80.0–100.0)
Monocytes Absolute: 0.5 10*3/uL (ref 0.1–1.0)
Monocytes Relative: 6 %
Neutro Abs: 5.7 10*3/uL (ref 1.7–7.7)
Neutrophils Relative %: 77 %
Platelets: 159 10*3/uL (ref 150–400)
RBC: 4.08 MIL/uL (ref 3.87–5.11)
RDW: 13.2 % (ref 11.5–15.5)
WBC: 7.6 10*3/uL (ref 4.0–10.5)
nRBC: 0 % (ref 0.0–0.2)

## 2022-05-29 MED ORDER — IOHEXOL 300 MG/ML  SOLN
75.0000 mL | Freq: Once | INTRAMUSCULAR | Status: AC | PRN
  Administered 2022-05-29: 75 mL via INTRAVENOUS

## 2022-05-29 MED ORDER — HEPARIN SOD (PORK) LOCK FLUSH 100 UNIT/ML IV SOLN
500.0000 [IU] | Freq: Once | INTRAVENOUS | Status: AC
  Administered 2022-05-29: 500 [IU] via INTRAVENOUS

## 2022-05-29 MED ORDER — HEPARIN SOD (PORK) LOCK FLUSH 100 UNIT/ML IV SOLN
INTRAVENOUS | Status: AC
  Filled 2022-05-29: qty 5

## 2022-05-29 NOTE — Progress Notes (Signed)
Rachel Gross presented for Portacath access and flush.  Portacath located L chest wall accessed with  H 20 needle.  Good blood return present. Portacath flushed with 20ml NS and labs drawn from [facility]. Patient discharged from clinic with [facility] accessed for CT scan

## 2022-05-30 ENCOUNTER — Other Ambulatory Visit: Payer: Medicaid Other

## 2022-06-01 ENCOUNTER — Telehealth: Payer: Self-pay | Admitting: *Deleted

## 2022-06-01 ENCOUNTER — Other Ambulatory Visit: Payer: Self-pay | Admitting: *Deleted

## 2022-06-01 DIAGNOSIS — C3491 Malignant neoplasm of unspecified part of right bronchus or lung: Secondary | ICD-10-CM

## 2022-06-01 NOTE — Telephone Encounter (Signed)
Attempted multiple times to touch base with patient to advise that Dr. Ellin Saba has ordered an MRI of thoracic spine.  Unable to contact her at this time.  Dr. Ellin Saba made aware.  Will continue to contact her.

## 2022-06-02 NOTE — Progress Notes (Signed)
Message from Dr. Ellin Saba- Please order MRI of thoracic spine with and without contrast stat. Reason: Epidural mass to evaluate for cord compression. Metastatic lung cancer.  Message from Kimberly, RN-  I am not able to get in touch with her Dr. Kirtland Bouchard. Her phone number is not accepting calls and her brothers phone goes to VM. I have asked [name] to touch base with her if possible tomorrow to evaluate her symptoms and assess pain, potentially sending her to the ER since the auth will take a while. Order is in, however.   Message from April Manson- Prior Auth for scan approved.  I have attempted to call the patient multiple times. The number in chart for patient says that call can not be completed as dialed and please try your call again later. Called brother's number in chart and left message on his VM to have patient call us back.

## 2022-06-04 NOTE — Progress Notes (Signed)
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 interlobar fissure have improved. The macrolobular nodule in the right lower lung which previously measured 13 x 7 mm, today on series 4, image 95 measures 16 x 8 mm. Slightly larger. Nodule seen in the right lung on series 4, image 87 today measures 7 by 4 mm. Previously this same nodule would have measured 9 by 7 mm. Upper Abdomen: Stable thickening of the adrenal gland on the left. The right adrenal gland is preserved. Fatty liver infiltration with a nodular contour. Musculoskeletal: Lytic bone lesions are again identified. This includes a destructive lesion involving the lateral aspect of the left fifth rib which previously has a soft tissue component measured at 2.8 by 2.0 cm. Today soft tissue component on series 4, image 62 measures 2.8 by 1.8  cm. Lesion has more calcification today. There is also a new lytic lesion involving the spine with some surrounding sclerosis at the T5 level. There is a significant soft tissue component extending into the central canal. Cord compression is possible. Please correlate with symptoms and further workup with thoracic spine MRI when able. 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 Please correlate with symptoms and further workup with thoracic spine MRI with and without contrast. Evolving lytic bone lesion involving the left fifth rib with more sclerosis. Decreasing ill-defined nodules in the right lung. There is one dominant nodule in the right lower lobe which was seen on the PET-CT scan which is slightly larger today. Stable small left lower lobe lung nodule with 1 new peripheral ill-defined nodule. Attention on follow-up. Decreasing mediastinal lymph node. Chronic liver changes. Aortic Atherosclerosis (ICD10-I70.0) and Emphysema (ICD10-J43.9). Findings will be called to the ordering service by the Radiology physician assistant team Electronically Signed   By: Karen Kays M.D.   On: 06/01/2022 15:53

## 2022-06-05 ENCOUNTER — Ambulatory Visit: Payer: Medicaid Other

## 2022-06-05 ENCOUNTER — Encounter: Payer: Self-pay | Admitting: Hematology

## 2022-06-05 ENCOUNTER — Inpatient Hospital Stay: Payer: Medicaid Other | Attending: Hematology | Admitting: Hematology

## 2022-06-05 ENCOUNTER — Ambulatory Visit (HOSPITAL_COMMUNITY)
Admission: RE | Admit: 2022-06-05 | Discharge: 2022-06-05 | Disposition: A | Discharge status: Home / Self Care | Payer: Medicaid Other | Source: Ambulatory Visit | Attending: Hematology | Admitting: Hematology

## 2022-06-05 ENCOUNTER — Ambulatory Visit (HOSPITAL_COMMUNITY): Admission: RE | Admit: 2022-06-05 | Payer: Medicaid Other | Source: Ambulatory Visit

## 2022-06-05 ENCOUNTER — Inpatient Hospital Stay: Payer: Medicaid Other

## 2022-06-05 VITALS — BP 114/65 | HR 81 | Temp 98.6°F | Resp 18 | Wt 151.7 lb

## 2022-06-05 DIAGNOSIS — D696 Thrombocytopenia, unspecified: Secondary | ICD-10-CM | POA: Insufficient documentation

## 2022-06-05 DIAGNOSIS — Z79620 Long term (current) use of immunosuppressive biologic: Secondary | ICD-10-CM | POA: Diagnosis not present

## 2022-06-05 DIAGNOSIS — Z5111 Encounter for antineoplastic chemotherapy: Secondary | ICD-10-CM | POA: Insufficient documentation

## 2022-06-05 DIAGNOSIS — Z5112 Encounter for antineoplastic immunotherapy: Secondary | ICD-10-CM | POA: Insufficient documentation

## 2022-06-05 DIAGNOSIS — K7460 Unspecified cirrhosis of liver: Secondary | ICD-10-CM | POA: Diagnosis not present

## 2022-06-05 DIAGNOSIS — Z95828 Presence of other vascular implants and grafts: Secondary | ICD-10-CM

## 2022-06-05 DIAGNOSIS — C3431 Malignant neoplasm of lower lobe, right bronchus or lung: Secondary | ICD-10-CM | POA: Insufficient documentation

## 2022-06-05 DIAGNOSIS — M546 Pain in thoracic spine: Secondary | ICD-10-CM | POA: Diagnosis not present

## 2022-06-05 DIAGNOSIS — C7951 Secondary malignant neoplasm of bone: Secondary | ICD-10-CM | POA: Insufficient documentation

## 2022-06-05 DIAGNOSIS — C3491 Malignant neoplasm of unspecified part of right bronchus or lung: Secondary | ICD-10-CM

## 2022-06-05 MED ORDER — FOLIC ACID 1 MG PO TABS: 1.0000 mg | ORAL_TABLET | Freq: Every day | ORAL | 1 refills | Status: DC

## 2022-06-05 MED ORDER — IOHEXOL 300 MG/ML  SOLN
75.0000 mL | Freq: Once | INTRAMUSCULAR | Status: AC | PRN
  Administered 2022-06-05: 75 mL via INTRAVENOUS

## 2022-06-05 MED ORDER — HEPARIN SOD (PORK) LOCK FLUSH 100 UNIT/ML IV SOLN
500.0000 [IU] | Freq: Once | INTRAVENOUS | Status: AC
  Administered 2022-06-05: 500 [IU] via INTRAVENOUS

## 2022-06-05 MED ORDER — DEXAMETHASONE 2 MG PO TABS: 2.0000 mg | ORAL_TABLET | Freq: Two times a day (BID) | ORAL | 0 refills | Status: DC

## 2022-06-05 MED ORDER — HEPARIN SOD (PORK) LOCK FLUSH 100 UNIT/ML IV SOLN
INTRAVENOUS | Status: AC
  Filled 2022-06-05: qty 5

## 2022-06-05 MED ORDER — CYANOCOBALAMIN 1000 MCG/ML IJ SOLN
1000.0000 ug | Freq: Once | INTRAMUSCULAR | Status: AC
  Administered 2022-06-05: 1000 ug via INTRAMUSCULAR

## 2022-06-05 MED ORDER — SODIUM CHLORIDE 0.9% FLUSH
10.0000 mL | Freq: Once | INTRAVENOUS | Status: AC
  Administered 2022-06-05: 10 mL via INTRAVENOUS

## 2022-06-05 NOTE — Addendum Note (Signed)
Addended by: Cori Razor on: 06/05/2022 04:12 PM   Modules accepted: Orders

## 2022-06-05 NOTE — Patient Instructions (Addendum)
[facility]  Discharge Instructions  You were seen and examined today by Dr. Ellin Saba.  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  Dr. Ellin Saba has recommended a CT scan of your spine because there is a risk that the cancer is pressing on your spinal cord in that area. Should you start experiencing decreased ability to control your urine/bowels, increased weakness in the legs, increased frequency of falls, or increased numbness/tingling in the legs please go to the Emergency Department immediately.  Dr. Ellin Saba has recommended starting on treatment in the form of  chemotherapy and immunotherapy. This is given once every 3 weeks here in the Cancer Center.  Start taking folic acid daily.  Dr. Ellin Saba will arrange for CT scan of your brain and a whole body PET scan as well. You will be referred back to radiation oncology.  Follow-up as scheduled.  Thank you for choosing [facility] to provide your oncology and hematology care.   To afford each patient quality time with our provider, please arrive at least 15 minutes before your scheduled appointment time. You may need to reschedule your appointment if you arrive late (10 or more minutes). Arriving late affects you and other patients whose appointments are after yours.  Also, if you miss three or more appointments without notifying the office, you may be dismissed from the clinic at the provider's discretion.    Again, thank you for choosing [facility].  Our hope is that these requests will decrease the amount of time that you wait before being seen by our physicians.   If you have a lab appointment with the Cancer Center - please note that after April 8th, all labs will be drawn in the cancer center.  You do not have to check in or register with the main entrance as you have in the past but will complete your check-in at the cancer center.             _____________________________________________________________  Should you have questions after your visit to [facility], please contact our office at [phone] and follow the prompts.  Our office hours are 8:00 a.m. to 4:30 p.m. Monday - Thursday and 8:00 a.m. to 2:30 p.m. Friday.  Please note that voicemails left after 4:00 p.m. may not be returned until the following business day.  We are closed weekends and all major holidays.  You do have access to a nurse 24-7, just call the main number to the clinic [phone] and do not press any options, hold on the line and a nurse will answer the phone.    For prescription refill requests, have your pharmacy contact our office and allow 72 hours.    Masks are no longer required in the cancer centers. If you would like for your care team to wear a mask while they are taking care of you, please let them know. You may have one support person who is at least 59 years old accompany you for your appointments.

## 2022-06-05 NOTE — Progress Notes (Signed)
B12 injection given in R Deltoid per order without incident.  Site CDI.  Patient discharged ambulatory in stable condition.

## 2022-06-05 NOTE — Progress Notes (Signed)
Port flushed with no blood return noted. No bruising or swelling at site. No blood return noted, Victorino Dike in CT made aware and patient discharged to CT in satisfactory condition. VVS stable with no signs or symptoms of distressed noted.

## 2022-06-06 ENCOUNTER — Encounter: Payer: Self-pay | Admitting: Hematology

## 2022-06-06 NOTE — Progress Notes (Signed)
DISCONTINUE ON PATHWAY REGIMEN - Non-Small Cell Lung     A cycle is every 21 days:     Pemetrexed      Carboplatin   **Always confirm dose/schedule in your pharmacy ordering system**  REASON: Other Reason PRIOR TREATMENT: [id number]: Carboplatin AUC=5 + Pemetrexed 500 mg/m2 q21 Days x 4 Cycles TREATMENT RESPONSE: N/A - Adjuvant Therapy  START ON PATHWAY REGIMEN - Non-Small Cell Lung     A cycle is every 21 days:     Pembrolizumab      Pemetrexed      Carboplatin   **Always confirm dose/schedule in your pharmacy ordering system**  Patient Characteristics: Stage IV Metastatic, Nonsquamous, Molecular Analysis Completed, Molecular Alteration Present and Targeted Therapy Exhausted OR KRAS G12C+ or HER2+ Present and No Prior Chemo/Immunotherapy OR No Alteration Present, Initial Chemotherapy/Immunotherapy, PS =  0, 1, No Alteration Present, No Alteration Present, Candidate for Immunotherapy, PD-L1 Expression Positive 1-49% (TPS) / Negative / Not Tested / Awaiting Test Results and Immunotherapy Candidate Therapeutic Status: Stage IV Metastatic Histology: Nonsquamous Cell Broad Molecular Profiling Status: Molecular Analysis Completed Molecular Analysis Results: No Alteration Present ECOG Performance Status: 1 Chemotherapy/Immunotherapy Line of Therapy: Initial Chemotherapy/Immunotherapy EGFR Exons 18-21 Mutation Testing Status: Completed and Negative ALK Fusion/Rearrangement Testing Status: Completed and Negative BRAF V600 Mutation Testing Status: Completed and Negative KRAS G12C Mutation Testing Status: Completed and Negative MET Exon 14 Mutation Testing Status: Completed and Negative RET Fusion/Rearrangement Testing Status: Completed and Negative HER2 Mutation Testing Status: Completed and Negative NTRK Fusion/Rearrangement Testing Status: Completed and Negative ROS1 Fusion/Rearrangement Testing Status: Completed and Negative Immunotherapy Candidate Status: Candidate for  Immunotherapy PD-L1 Expression Status: PD-L1 Positive 1-49% (TPS) Intent of Therapy: Non-Curative / Palliative Intent, Discussed with Patient

## 2022-06-07 ENCOUNTER — Other Ambulatory Visit: Payer: Self-pay

## 2022-06-15 ENCOUNTER — Encounter (HOSPITAL_COMMUNITY): Payer: Self-pay

## 2022-06-15 ENCOUNTER — Encounter (HOSPITAL_COMMUNITY)
Admission: RE | Admit: 2022-06-15 | Discharge: 2022-06-15 | Disposition: A | Discharge status: Home / Self Care | Payer: Medicaid Other | Source: Ambulatory Visit | Attending: Hematology | Admitting: Hematology

## 2022-06-15 DIAGNOSIS — C3491 Malignant neoplasm of unspecified part of right bronchus or lung: Secondary | ICD-10-CM | POA: Insufficient documentation

## 2022-06-20 NOTE — Progress Notes (Signed)
Pharmacist Chemotherapy Monitoring - Initial Assessment    Anticipated start date: 06/29/22   The following has been reviewed per standard work regarding the patient's treatment regimen: The patient's diagnosis, treatment plan and drug doses, and organ/hematologic function Lab orders and baseline tests specific to treatment regimen  The treatment plan start date, drug sequencing, and pre-medications Prior authorization status  Patient's documented medication list, including drug-drug interaction screen and prescriptions for anti-emetics and supportive care specific to the treatment regimen The drug concentrations, fluid compatibility, administration routes, and timing of the medications to be used The patient's access for treatment and lifetime cumulative dose history, if applicable  The patient's medication allergies and previous infusion related reactions, if applicable   Changes made to treatment plan:  pre-medications for cycle 3 and 4 - famotidine and cetirizine due to #7 carboplatin dose.  Follow up needed:  N/A   Stephens Shire, [facility], 06/20/2022  12:00 PM

## 2022-06-22 ENCOUNTER — Encounter (HOSPITAL_COMMUNITY)
Admission: RE | Admit: 2022-06-22 | Discharge: 2022-06-22 | Disposition: A | Discharge status: Home / Self Care | Payer: Medicaid Other | Source: Ambulatory Visit | Attending: Hematology | Admitting: Hematology

## 2022-06-22 DIAGNOSIS — C3491 Malignant neoplasm of unspecified part of right bronchus or lung: Secondary | ICD-10-CM | POA: Diagnosis present

## 2022-06-22 MED ORDER — FLUDEOXYGLUCOSE F - 18 (FDG) INJECTION
7.3400 | Freq: Once | INTRAVENOUS | Status: AC | PRN
  Administered 2022-06-22: 7.34 via INTRAVENOUS

## 2022-06-28 NOTE — Progress Notes (Signed)
[facility] [address], [place] [address]    Clinic Day:  06/29/2022  Referring physician: Benita Stabile, MD  Patient Care Team: Benita Stabile, MD as PCP - General (Internal Medicine) Jena Gauss Gerrit Friends, MD as Consulting Physician (Gastroenterology) Doreatha Massed, MD as Consulting Physician (Medical Oncology)   ASSESSMENT & PLAN:   Assessment: 1.  Metastatic lung adenocarcinoma: -PET scan on 05/13/2019 showed right lower lobe nodule concerning for bronchogenic carcinoma.  Cirrhotic liver. -Right lower lobectomy and lymph node excision on 06/19/2019. -Pathology showed 1.7 cm right lower lobe adenocarcinoma, unifocal, lymphovascular invasion present.  Margins negative.  2/14 lymph nodes positive (level 7 and 10R).  PT1CPN2. -PD-L1 30%, K-ras G12A, MSI stable.  EGFR mutation not identified. -4 cycles of adjuvant carboplatin and pemetrexed from 09/09/2019 through 11/11/2019. - CT chest on 12/29/2021: Lytic metastatic lesion in the posterior left fifth rib.  Interval enlargement of cavitary nodule of the dependent right upper lobe.  Numerous new clustered nodules of varying sizes. - PET scan (01/19/2022): Cavitary nodule of concern in the right middle lobe measures 1.3 x 0.7 cm, SUV max of 1.8.  No other hypermetabolic or suspicious lung nodules.  Expansile lytic metastasis involving left fifth rib hypermetabolic measuring 2.6 x 1.9 cm with SUV 4.2. - Left rib biopsy (02/06/2022): Metastatic moderately to poorly differentiated adenocarcinoma - NGS by [facility]: PD-L1 (22 C3)-TPS 5%.  TMB-high.  K-ras G12 A pathogenic variant.  T p53 pathogenic variant.  Other targetable mutations negative.  MSI-stable. - XRT to the left rib lesion completed - SBRT to the T5 lesion from 06/12/2022 through 06/16/2022    Plan: 1.  Metastatic adenocarcinoma of the lung to the left rib: - Reviewed CT had from 06/05/2022: Negative for metastatic disease. - Reviewed PET scan from 06/22/2022: 18 mm  right middle lobe nodule suspicious for metachronous primary versus metastasis.  Osseous metastasis at T5 body and left posterior lateral fifth rib. - We talked about initiating her on carboplatin, pemetrexed and pembrolizumab.  She is taking folic acid and already received B12 injection. - We talked about side effects in detail.  She will proceed with cycle 1 today with 20% dose reduction. - She will be evaluated in symptom management clinic due to elevated alk phos.  RTC 3 weeks with me for cycle 2.   2.  Left mid back pain: - She has completed radiation therapy to T5 lesion few days ago.  Pain is better. - She is currently taking dexamethasone 2 mg twice daily. - Recommend decreasing dexamethasone to 2 mg once daily for 7 days followed by every other day until she finishes the bottle. - She is not requiring tramadol.  She had to take tramadol this morning for knee pains.   3.  Hypomagnesemia: - She will receive IV magnesium today.  We will start her on magnesium 400 mg daily.    Orders Placed This Encounter  Procedures   Magnesium    Standing Status:   Future    Standing Expiration Date:   09/21/2023   CBC with Differential    Standing Status:   Future    Standing Expiration Date:   09/21/2023   Comprehensive metabolic panel    Standing Status:   Future    Standing Expiration Date:   09/21/2023   Magnesium    Standing Status:   Future    Standing Expiration Date:   10/12/2023   CBC with Differential    Standing Status:  Future    Standing Expiration Date:   10/12/2023   Comprehensive metabolic panel    Standing Status:   Future    Standing Expiration Date:   10/12/2023   T4    Standing Status:   Future    Standing Expiration Date:   10/12/2023   TSH    Standing Status:   Future    Standing Expiration Date:   10/12/2023   Magnesium    Standing Status:   Future    Standing Expiration Date:   11/02/2023   CBC with Differential    Standing Status:   Future    Standing Expiration  Date:   11/02/2023   Comprehensive metabolic panel    Standing Status:   Future    Standing Expiration Date:   11/02/2023   CBC with Differential    Standing Status:   Future    Standing Expiration Date:   06/29/2023   Comprehensive metabolic panel    Standing Status:   Future    Standing Expiration Date:   06/29/2023   Magnesium    Standing Status:   Future    Standing Expiration Date:   06/29/2023      I,Katie Daubenspeck,acting as a scribe for Doreatha Massed, MD.,have documented all relevant documentation on the behalf of Doreatha Massed, MD,as directed by  Doreatha Massed, MD while in the presence of Doreatha Massed, MD.   I, Doreatha Massed MD, have reviewed the above documentation for accuracy and completeness, and I agree with the above.   Doreatha Massed, MD   5/30/20245:55 PM  CHIEF COMPLAINT:   Diagnosis: metastatic right lung cancer    Cancer Staging  Non-small cell cancer of right lung [facility]) Staging form: Lung, AJCC 8th Edition - Clinical stage from 02/13/2022: Stage IV (cT1c, cN2, pM1) - Signed by Doreatha Massed, MD on 02/13/2022    Prior Therapy: 1. Right lower lobectomy on 06/19/2019. 2. Carboplatin and pemetrexed x 4 cycles from 09/09/2019 to 11/11/2019 3. SBRT to left rib/chest wall lesion, completed 03/22/22 4. SBRT to T5 lesion, 06/12/22 - 06/16/22  Current Therapy:  carboplatin, pemetrexed and pembrolizumab    HISTORY OF PRESENT ILLNESS:   Oncology History  Adenocarcinoma of lung, stage 3, right (HCC)  07/02/2019 Initial Diagnosis   Adenocarcinoma of lung, stage 3, right (HCC)   07/18/2019 Genetic Testing   [facility]     07/23/2019 Genetic Testing   PD-L1     09/09/2019 - 11/11/2019 Chemotherapy   Patient is on Treatment Plan : LUNG NSCLC Pemetrexed (Alimta) / Carboplatin q21d x 4 cycles     Non-small cell cancer of right lung (HCC)  02/13/2022 Initial Diagnosis   Non-small cell cancer of right lung (HCC)    02/13/2022 Cancer Staging   Staging form: Lung, AJCC 8th Edition - Clinical stage from 02/13/2022: Stage IV (cT1c, cN2, pM1) - Signed by Doreatha Massed, MD on 02/13/2022 Histopathologic type: Adenocarcinoma, NOS Stage prefix: Initial diagnosis Histologic grade (G): G3 Histologic grading system: 4 grade system   06/29/2022 -  Chemotherapy   Patient is on Treatment Plan : LUNG Carboplatin (5) + Pemetrexed (500) + Pembrolizumab (200) D1 q21d Induction x 4 cycles / Maintenance Pemetrexed (500) + Pembrolizumab (200) D1 q21d        INTERVAL HISTORY:   Alyanna is a 57 y.o. female presenting to clinic today for follow up of metastatic right lung cancer. She was last seen by me on 06/05/22.  Following his last visit, he underwent thoracic spine CT  the same day showing: lytic lesion at junction of T5 vertebral body and pedicle with a soft tissue component extending into left ventral spinal canal.  Staging head CT performed the same day was negative for metastatic disease.  She was seen by Dr. Langston Masker on 06/08/22 to discuss palliative radiation. She subsequently received SBRT to the thoracic spine lesion 06/12/22 - 06/16/22.  She also underwent restaging PET scan on 06/22/22 showing findings unchanged from recent chest CT (05/29/22) but progressive from prior PET (01/19/22): 18 mm posterior RML nodule; osseous metastases involving T5 and left 5th rib.  Today, she states that she is doing well overall. Her appetite level is at 100%. Her energy level is at 50%.  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    Bipolar disorder (HCC)    Cancer (HCC)    right lung   COPD (chronic obstructive pulmonary disease) (HCC)    Depression    Dyspnea    Emphysema lung (HCC)    GERD (gastroesophageal reflux disease)    Hepatitis  C    s/p treatment with Epclusa   History of HPV infection    History of pneumonia    Pneumonia    Smoker     Surgical History: Past Surgical History:  Procedure Laterality Date   BIOPSY  05/02/2018   Procedure: BIOPSY;  Surgeon: Corbin Ade, MD;  [facility];  Service: Endoscopy;;  gastric   CESAREAN SECTION     CHEST TUBE INSERTION Right 06/26/2019   Procedure: Right CHEST TUBE REPLACEMENT;  Surgeon: Loreli Slot, MD;  Location: [facility] OR;  Service: Thoracic;  Laterality: Right;   COLONOSCOPY WITH PROPOFOL N/A 03/03/2019   Procedure: COLONOSCOPY WITH PROPOFOL;  Surgeon: Corbin Ade, MD;  Location: [facility];  Service: Endoscopy;  Laterality: N/A;  9:15am   ESOPHAGOGASTRODUODENOSCOPY (EGD) WITH PROPOFOL N/A 05/02/2018   Procedure: ESOPHAGOGASTRODUODENOSCOPY (EGD) WITH PROPOFOL;  Surgeon: Corbin Ade, MD;  [facility];  Service: Endoscopy;  Laterality: N/A;  8:30am   EYE SURGERY Bilateral    cataract   HEMOSTASIS CLIP PLACEMENT  03/03/2019   Procedure: HEMOSTASIS CLIP PLACEMENT;  Surgeon: Corbin Ade, MD;  [facility];  Service: Endoscopy;;   INTERCOSTAL NERVE BLOCK Right 06/19/2019   Procedure: Intercostal Nerve Block;  Surgeon: Loreli Slot, MD;  Location: [facility] OR;  Service: Thoracic;  Laterality: Right;   IR US GUIDE VASC ACCESS RIGHT  03/13/2018   LOBECTOMY     LYMPH NODE DISSECTION Right 06/19/2019   Procedure: Lymph Node Dissection;  Surgeon: Loreli Slot, MD;  Location: [facility] OR;  Service: Thoracic;  Laterality: Right;   POLYPECTOMY  03/03/2019   Procedure: POLYPECTOMY;  Surgeon: Corbin Ade, MD;  [facility];  Service: Endoscopy;;   PORTACATH PLACEMENT Left 09/08/2019   Procedure: INSERTION PORT-A-CATH LEFT CHEST (attached catheter in left subclavian);  Surgeon: Franky Macho, MD;  Location: [facility];  Service: General;  Laterality: Left;   TONSILLECTOMY     TOOTH EXTRACTION  01/31/2018   x 7   VIDEO  BRONCHOSCOPY WITH INSERTION OF INTERBRONCHIAL VALVE (IBV) N/A 06/26/2019   Procedure: VIDEO BRONCHOSCOPY WITH INSERTION OF TWO INTERBRONCHIAL VALVE (IBV);  Surgeon: Loreli Slot, MD;  Location: [facility] OR;  Service:  Thoracic;  Laterality: N/A;   VIDEO BRONCHOSCOPY WITH INSERTION OF INTERBRONCHIAL VALVE (IBV) N/A 08/08/2019   Procedure: VIDEO BRONCHOSCOPY WITH REMOVAL OF INTERBRONCHIAL VALVE (IBV);  Surgeon: Loreli Slot, MD;  Location: [facility] OR;  Service: Thoracic;  Laterality: N/A;    Social History: Social History   Socioeconomic History   Marital status: Single    Spouse name: Not on file   Number of children: Not on file   Years of education: Not on file   Highest education level: Not on file  Occupational History   Not on file  Tobacco Use   Smoking status: Former    Packs/day: 0.50    Years: 38.00    Additional pack years: 0.00    Total pack years: 19.00    Types: Cigarettes    Quit date: 06/19/2019    Years since quitting: 3.0   Smokeless tobacco: Never  Vaping Use   Vaping Use: Never used  Substance and Sexual Activity   Alcohol use: Not Currently    Comment: Last use of alcohol 03/2016   Drug use: Not Currently    Types: 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   Sexual activity: Not Currently  Other Topics Concern   Not on file  Social History Narrative   Not on file   Social Determinants of Health   Financial Resource Strain: Medium Risk (12/10/2019)   Overall Financial Resource Strain (CARDIA)    Difficulty of Paying Living Expenses: Somewhat hard  Food Insecurity: Food Insecurity Present (12/10/2019)   Hunger Vital Sign    Worried About Running Out of Food in the Last Year: Often true    Ran Out of Food in the Last Year: Often true  Transportation Needs: No Transportation Needs (12/10/2019)   PRAPARE - Administrator, Civil Service (Medical): No    Lack of Transportation (Non-Medical): No  Physical Activity: Sufficiently Active (12/10/2019)    Exercise Vital Sign    Days of Exercise per Week: 3 days    Minutes of Exercise per Session: 150+ min  Stress: Stress Concern Present (12/10/2019)   [employer] of Occupational Health - Occupational Stress Questionnaire    Feeling of Stress : Very much  Social Connections: Socially Isolated (12/10/2019)   Social Connection and Isolation Panel [NHANES]    Frequency of Communication with Friends and Family: More than three times a week    Frequency of Social Gatherings with Friends and Family: Once a week    Attends Religious Services: Never    Database administrator or Organizations: No    Attends Banker Meetings: Never    Marital Status: Divorced  Catering manager Violence: Not At Risk (12/10/2019)   Humiliation, Afraid, Rape, and Kick questionnaire    Fear of Current or Ex-Partner: No    Emotionally Abused: No    Physically Abused: No    Sexually Abused: No    Family History: Family History  Problem Relation Age of Onset   Congenital heart disease Mother    Bipolar disorder Mother    Prostate cancer Brother    Alzheimer's disease Paternal Grandmother    Colon cancer Neg Hx     Current Medications:  Current Outpatient Medications:    albuterol (VENTOLIN HFA) 108 (90 Base) MCG/ACT inhaler, Inhale 2 puffs into the lungs every 4 (four) hours as needed for wheezing or shortness of breath., Disp: , Rfl:    dexamethasone (DECADRON) 2 MG tablet, Take 1 tablet (2 mg  total) by mouth 2 (two) times daily., Disp: 60 tablet, Rfl: 0   diphenhydrAMINE (BENADRYL) 25 MG tablet, Take 25 mg by mouth at bedtime., Disp: , Rfl:    divalproex (DEPAKOTE) 250 MG DR tablet, Take 250 mg by mouth at bedtime. , Disp: , Rfl:    folic acid (FOLVITE) 1 MG tablet, Take 1 tablet (1 mg total) by mouth daily., Disp: 90 tablet, Rfl: 1   ipratropium-albuterol (DUONEB) 0.5-2.5 (3) MG/3ML SOLN, Take 3 mLs by nebulization every 6 (six) hours as needed., Disp: 360 mL, Rfl: 3   lidocaine-prilocaine  (EMLA) cream, Apply a small amount to port a cath site and cover with plastic wrap 1 hour prior to chemotherapy appointments, Disp: 30 g, Rfl: 3   magnesium oxide (MAG-OX) 400 (240 Mg) MG tablet, Take 1 tablet (400 mg total) by mouth daily., Disp: 30 tablet, Rfl: 3   ondansetron (ZOFRAN ODT) 8 MG disintegrating tablet, Place 1 tablet under tongue every 8 hours as needed for nausea. You may begin using this medication on the third day after each chemotherapy treatment., Disp: 20 tablet, Rfl: 1   pantoprazole (PROTONIX) 40 MG tablet, TAKE 1 TABLET BY MOUTH DAILY BEFORE BREAKFAST, Disp: 90 tablet, Rfl: 3   risperiDONE (RISPERDAL) 2 MG tablet, Take 2 mg by mouth at bedtime. , Disp: , Rfl:    scopolamine (TRANSDERM-SCOP) 1 MG/3DAYS, Place 1 patch (1.5 mg total) onto the skin every 3 (three) days., Disp: 10 patch, Rfl: 12   SYMBICORT 160-4.5 MCG/ACT inhaler, Inhale 2 puffs into the lungs 2 (two) times daily., Disp: 1 each, Rfl: 6   traMADol (ULTRAM) 50 MG tablet, TAKE (2) TABLETS BY MOUTH TWICE DAILY AS NEEDED., Disp: 120 tablet, Rfl: 0   traMADol HCl 100 MG TABS, Take 100 mg by mouth 2 (two) times daily as needed., Disp: 60 tablet, Rfl: 0   traZODone (DESYREL) 100 MG tablet, Take by mouth., Disp: , Rfl:  No current facility-administered medications for this visit.  Facility-Administered Medications Ordered in Other Visits:    sodium chloride flush (NS) 0.9 % injection 10 mL, 10 mL, Intracatheter, PRN, Doreatha Massed, MD, 10 mL at 06/29/22 1258   Allergies: Allergies  Allergen Reactions   Folic Acid Anaphylaxis    Not entirely clear if it is from folic acid. She is taking folic acid now and doesn't have the throat swelling anymore   Penicillins Anaphylaxis    Throat swelled Did it involve swelling of the face/tongue/throat, SOB, or low BP? Yes Did it involve sudden or severe rash/hives, skin peeling, or any reaction on the inside of your mouth or nose? No Did you need to seek medical  attention at a hospital or doctor's office? No When did it last happen?      childhood allergy If all above answers are "NO", may proceed with cephalosporin use.    Amoxicillin     hallucinations Did it involve swelling of the face/tongue/throat, SOB, or low BP? No Did it involve sudden or severe rash/hives, skin peeling, or any reaction on the inside of your mouth or nose? No Did you need to seek medical attention at a hospital or doctor's office? Yes When did it last happen?      July 2019 If all above answers are "NO", may proceed with cephalosporin use.    Fish Allergy Nausea Only   Other Swelling    Patient states that the sunrise brand folic acid made her feel like her throat was swelling.  REVIEW OF SYSTEMS:   Review of Systems  Constitutional:  Negative for chills, fatigue and fever.  HENT:   Negative for lump/mass, mouth sores, nosebleeds, sore throat and trouble swallowing.   Eyes:  Negative for eye problems.  Respiratory:  Positive for cough and shortness of breath.   Cardiovascular:  Negative for chest pain, leg swelling and palpitations.  Gastrointestinal:  Negative for abdominal pain, constipation, diarrhea, nausea and vomiting.  Genitourinary:  Negative for bladder incontinence, difficulty urinating, dysuria, frequency, hematuria and nocturia.   Musculoskeletal:  Positive for arthralgias. Negative for back pain, flank pain, myalgias and neck pain.  Skin:  Negative for itching and rash.  Neurological:  Positive for headaches. Negative for dizziness and numbness.  Hematological:  Does not bruise/bleed easily.  Psychiatric/Behavioral:  Negative for depression, sleep disturbance and suicidal ideas. The patient is not nervous/anxious.   All other systems reviewed and are negative.    VITALS:   There were no vitals taken for this visit.  Wt Readings from Last 3 Encounters:  06/29/22 154 lb 12.8 oz (70.2 kg)  06/05/22 151 lb 11.2 oz (68.8 kg)  02/13/22 151 lb 8 oz  (68.7 kg)    There is no height or weight on file to calculate BMI.  Performance status (ECOG): 1 - Symptomatic but completely ambulatory  PHYSICAL EXAM:   Physical Exam Vitals and nursing note reviewed. Exam conducted with a chaperone present.  Constitutional:      Appearance: Normal appearance.  Cardiovascular:     Rate and Rhythm: Normal rate and regular rhythm.     Pulses: Normal pulses.     Heart sounds: Normal heart sounds.  Pulmonary:     Effort: Pulmonary effort is normal.     Breath sounds: Normal breath sounds.  Abdominal:     Palpations: Abdomen is soft. There is no hepatomegaly, splenomegaly or mass.     Tenderness: There is no abdominal tenderness.  Musculoskeletal:     Right lower leg: No edema.     Left lower leg: No edema.  Lymphadenopathy:     Cervical: No cervical adenopathy.     Right cervical: No superficial, deep or posterior cervical adenopathy.    Left cervical: No superficial, deep or posterior cervical adenopathy.     Upper Body:     Right upper body: No supraclavicular or axillary adenopathy.     Left upper body: No supraclavicular or axillary adenopathy.  Neurological:     General: No focal deficit present.     Mental Status: She is alert and oriented to person, place, and time.  Psychiatric:        Mood and Affect: Mood normal.        Behavior: Behavior normal.     LABS:      Latest Ref Rng & Units 06/29/2022    8:31 AM 05/29/2022    2:34 PM 02/06/2022    9:00 AM  CBC  WBC 4.0 - 10.5 K/uL 11.4  7.6  8.5   Hemoglobin 12.0 - 15.0 g/dL 16.1  09.6  04.5   Hematocrit 36.0 - 46.0 % 43.7  39.7  43.3   Platelets 150 - 400 K/uL 139  159  185       Latest Ref Rng & Units 06/29/2022    8:31 AM 05/29/2022    2:34 PM 02/06/2022    9:00 AM  CMP  Glucose 70 - 99 mg/dL 409  811  96   BUN 6 - 20 mg/dL 14  6  8   Creatinine 0.44 - 1.00 mg/dL 0.98  1.19  1.47   Sodium 135 - 145 mmol/L 133  134  135   Potassium 3.5 - 5.1 mmol/L 3.5  3.5  3.6   Chloride  98 - 111 mmol/L 95  98  99   CO2 22 - 32 mmol/L 27  25  27    Calcium 8.9 - 10.3 mg/dL 8.7  8.8  8.8   Total Protein 6.5 - 8.1 g/dL 6.9  7.2    Total Bilirubin 0.3 - 1.2 mg/dL 0.8  0.5    Alkaline Phos 38 - 126 U/L 48  C 55    AST 15 - 41 U/L 19  22    ALT 0 - 44 U/L 23  14      C Corrected result     No results found for: "CEA1", "CEA" / No results found for: "CEA1", "CEA" No results found for: "PSA1" No results found for: "WGN562" No results found for: "CAN125"  No results found for: "TOTALPROTELP", "ALBUMINELP", "A1GS", "A2GS", "BETS", "BETA2SER", "GAMS", "MSPIKE", "SPEI" Lab Results  Component Value Date   TIBC 362 01/17/2018   FERRITIN 136 01/17/2018   IRONPCTSAT 40 (H) 01/17/2018   Lab Results  Component Value Date   LDH 146 05/01/2019   LDH 168 11/28/2017   LDH 185 08/24/2017     STUDIES:   NM PET Image Restag (PS) Skull Base To Thigh  Result Date: 06/28/2022 CLINICAL DATA:  Subsequent treatment strategy for right lung cancer. EXAM: NUCLEAR MEDICINE PET SKULL BASE TO THIGH TECHNIQUE: 7.3 mCi F-18 FDG was injected intravenously. Full-ring PET imaging was performed from the skull base to thigh after the radiotracer. CT data was obtained and used for attenuation correction and anatomic localization. Fasting blood glucose: 123 mg/dl COMPARISON:  Thoracic spine CT dated 06/05/2022. CT chest dated 05/29/2022. PET-CT dated 01/19/2022. FINDINGS: Mediastinal blood pool activity: SUV max 2.1 Liver activity: SUV max NA NECK: No hypermetabolic cervical lymphadenopathy. Incidental CT findings: None. CHEST: Status post right lower lobectomy. 8 x 18 mm posterior right middle lobe nodule (series 7/image 39), max SUV 3.3, suspicious for metachronous primary bronchogenic carcinoma versus metastasis. Additional 5 mm left lower lobe nodule (series 7/image 65), non FDG avid but beneath the size threshold for PET sensitivity. No hypermetabolic thoracic lymphadenopathy. Left chest port terminates  at the cavoatrial junction. Incidental CT findings: Atherosclerotic calcifications of the aortic arch. Mild coronary atherosclerosis of the LAD. ABDOMEN/PELVIS: No abnormal hypermetabolism in the liver, spleen, pancreas, or adrenal glands. No hypermetabolic abdominopelvic lymphadenopathy. Incidental CT findings: Atherosclerotic calcifications of the abdominal aorta and branch vessels. Extensive colonic diverticulosis, without evidence of diverticulitis. SKELETON: Sclerotic lesion at T5 (series 3/image 82), max SUV 2.7, corresponding to the patient's known osseous metastasis. Expansile lytic osseous metastasis involving the left posterolateral 5th rib (series 3/image 86), max SUV 3.4. Incidental CT findings: None. IMPRESSION: Status post right lower lobectomy. 18 mm posterior right middle lobe nodule, suspicious for metachronous primary bronchogenic carcinoma versus metastasis. Osseous metastases involving the T5 vertebral body and left posterolateral 5th rib. Findings are unchanged from recent CT chest but progressive from prior PET. Electronically Signed   By: Charline Bills M.D.   On: 06/28/2022 00:58   CT HEAD W & WO CONTRAST ( )  Result Date: 06/05/2022 CLINICAL DATA:  Non-small cell lung carcinoma staging EXAM: CT HEAD WITHOUT AND WITH CONTRAST TECHNIQUE: Contiguous axial images were obtained from the base of the skull through the vertex without  and with intravenous contrast. RADIATION DOSE REDUCTION: This exam was performed according to the departmental dose-optimization program which includes automated exposure control, adjustment of the mA and/or kV according to patient size and/or use of iterative reconstruction technique. CONTRAST:  75mL OMNIPAQUE IOHEXOL 300 MG/ML  SOLN COMPARISON:  None Available. FINDINGS: Brain: There is no mass, hemorrhage or extra-axial collection. The size and configuration of the ventricles and extra-axial CSF spaces are normal. There is hypoattenuation of the white matter,  most commonly indicating chronic small vessel disease. There is no abnormal contrast enhancement. Vascular: No abnormal hyperdensity of the major intracranial arteries or dural venous sinuses. No intracranial atherosclerosis. Skull: The visualized skull base, calvarium and extracranial soft tissues are normal. Sinuses/Orbits: No fluid levels or advanced mucosal thickening of the visualized paranasal sinuses. No mastoid or middle ear effusion. The orbits are normal. IMPRESSION: 1. No intracranial metastatic disease. 2. Chronic small vessel disease. Electronically Signed   By: Deatra Robinson M.D.   On: 06/05/2022 18:57   CT Thoracic Spine W Contrast  Result Date: 06/05/2022 CLINICAL DATA:  Compression fracture EXAM: CT THORACIC SPINE WITH CONTRAST TECHNIQUE: Multidetector CT images of thoracic was performed according to the standard protocol following intravenous contrast administration. RADIATION DOSE REDUCTION: This exam was performed according to the departmental dose-optimization program which includes automated exposure control, adjustment of the mA and/or kV according to patient size and/or use of iterative reconstruction technique. CONTRAST:  75mL OMNIPAQUE IOHEXOL 300 MG/ML  SOLN COMPARISON:  Chest CT 05/29/2022 FINDINGS: Alignment: Normal. Vertebrae: There is a lytic lesion at the junction of the T5 vertebral body and pedicle with a soft tissue component extending into the left ventral spinal canal. No acute fracture. Paraspinal and other soft tissues: 5 mm nodule the right lower lobe, as demonstrated on prior chest CT. Other pulmonary findings are more clearly characterized on that is at the. Disc levels: No high-grade spinal canal stenosis. IMPRESSION: Lytic lesion at the junction of the T5 vertebral body and pedicle with a soft tissue component extending into the left ventral spinal canal, consistent with metastatic disease. Electronically Signed   By: Deatra Robinson M.D.   On: 06/05/2022 18:56

## 2022-06-29 ENCOUNTER — Inpatient Hospital Stay: Payer: Medicaid Other

## 2022-06-29 ENCOUNTER — Inpatient Hospital Stay (HOSPITAL_BASED_OUTPATIENT_CLINIC_OR_DEPARTMENT_OTHER): Payer: Medicaid Other | Admitting: Hematology

## 2022-06-29 VITALS — BP 110/50 | HR 90 | Temp 98.9°F | Resp 20

## 2022-06-29 VITALS — BP 111/42 | HR 102 | Temp 97.7°F | Resp 20 | Wt 154.8 lb

## 2022-06-29 DIAGNOSIS — C3491 Malignant neoplasm of unspecified part of right bronchus or lung: Secondary | ICD-10-CM

## 2022-06-29 DIAGNOSIS — Z5112 Encounter for antineoplastic immunotherapy: Secondary | ICD-10-CM | POA: Diagnosis not present

## 2022-06-29 DIAGNOSIS — R748 Abnormal levels of other serum enzymes: Secondary | ICD-10-CM

## 2022-06-29 DIAGNOSIS — Z95828 Presence of other vascular implants and grafts: Secondary | ICD-10-CM

## 2022-06-29 LAB — CBC WITH DIFFERENTIAL/PLATELET
Abs Immature Granulocytes: 0.08 10*3/uL — ABNORMAL HIGH (ref 0.00–0.07)
Basophils Absolute: 0 10*3/uL (ref 0.0–0.1)
Basophils Relative: 0 %
Eosinophils Absolute: 0 10*3/uL (ref 0.0–0.5)
Eosinophils Relative: 0 %
HCT: 43.7 % (ref 36.0–46.0)
Hemoglobin: 14.8 g/dL (ref 12.0–15.0)
Immature Granulocytes: 1 %
Lymphocytes Relative: 12 %
Lymphs Abs: 1.4 10*3/uL (ref 0.7–4.0)
MCH: 33.6 pg (ref 26.0–34.0)
MCHC: 33.9 g/dL (ref 30.0–36.0)
MCV: 99.1 fL (ref 80.0–100.0)
Monocytes Absolute: 0.8 10*3/uL (ref 0.1–1.0)
Monocytes Relative: 7 %
Neutro Abs: 9.1 10*3/uL — ABNORMAL HIGH (ref 1.7–7.7)
Neutrophils Relative %: 80 %
Platelets: 139 10*3/uL — ABNORMAL LOW (ref 150–400)
RBC: 4.41 MIL/uL (ref 3.87–5.11)
RDW: 13.6 % (ref 11.5–15.5)
WBC: 11.4 10*3/uL — ABNORMAL HIGH (ref 4.0–10.5)
nRBC: 0 % (ref 0.0–0.2)

## 2022-06-29 LAB — COMPREHENSIVE METABOLIC PANEL
ALT: 23 U/L (ref 0–44)
AST: 19 U/L (ref 15–41)
Albumin: 3.7 g/dL (ref 3.5–5.0)
Alkaline Phosphatase: 48 U/L (ref 38–126)
Anion gap: 11 (ref 5–15)
BUN: 14 mg/dL (ref 6–20)
CO2: 27 mmol/L (ref 22–32)
Calcium: 8.7 mg/dL — ABNORMAL LOW (ref 8.9–10.3)
Chloride: 95 mmol/L — ABNORMAL LOW (ref 98–111)
Creatinine, Ser: 0.59 mg/dL (ref 0.44–1.00)
GFR, Estimated: >60 mL/min (ref >60)
Glucose, Bld: 130 mg/dL — ABNORMAL HIGH (ref 70–99)
Potassium: 3.5 mmol/L (ref 3.5–5.1)
Sodium: 133 mmol/L — ABNORMAL LOW (ref 135–145)
Total Bilirubin: 0.8 mg/dL (ref 0.3–1.2)
Total Protein: 6.9 g/dL (ref 6.5–8.1)

## 2022-06-29 LAB — TSH: TSH: 3.222 u[IU]/mL (ref 0.350–4.500)

## 2022-06-29 LAB — MAGNESIUM: Magnesium: 1.6 mg/dL — ABNORMAL LOW (ref 1.7–2.4)

## 2022-06-29 MED ORDER — SODIUM CHLORIDE 0.9 % IV SOLN
544.0000 mg | Freq: Once | INTRAVENOUS | Status: AC
  Administered 2022-06-29: 540 mg via INTRAVENOUS
  Filled 2022-06-29: qty 54

## 2022-06-29 MED ORDER — SODIUM CHLORIDE 0.9% FLUSH
10.0000 mL | Freq: Once | INTRAVENOUS | Status: AC
  Administered 2022-06-29: 10 mL via INTRAVENOUS

## 2022-06-29 MED ORDER — SODIUM CHLORIDE 0.9 % IV SOLN
10.0000 mg | Freq: Once | INTRAVENOUS | Status: AC
  Administered 2022-06-29: 10 mg via INTRAVENOUS
  Filled 2022-06-29: qty 10

## 2022-06-29 MED ORDER — PALONOSETRON HCL INJECTION 0.25 MG/5ML
0.2500 mg | Freq: Once | INTRAVENOUS | Status: AC
  Administered 2022-06-29: 0.25 mg via INTRAVENOUS
  Filled 2022-06-29: qty 5

## 2022-06-29 MED ORDER — IPRATROPIUM-ALBUTEROL 0.5-2.5 (3) MG/3ML IN SOLN: 3.0000 mL | Freq: Four times a day (QID) | RESPIRATORY_TRACT | 3 refills | Status: DC | PRN

## 2022-06-29 MED ORDER — SODIUM CHLORIDE 0.9 % IV SOLN
400.0000 mg/m2 | Freq: Once | INTRAVENOUS | Status: AC
  Administered 2022-06-29: 700 mg via INTRAVENOUS
  Filled 2022-06-29: qty 20

## 2022-06-29 MED ORDER — SODIUM CHLORIDE 0.9% FLUSH
10.0000 mL | INTRAVENOUS | Status: DC | PRN
  Administered 2022-06-29: 10 mL

## 2022-06-29 MED ORDER — MAGNESIUM OXIDE -MG SUPPLEMENT 400 (240 MG) MG PO TABS: 400.0000 mg | ORAL_TABLET | Freq: Every day | ORAL | 3 refills | Status: DC

## 2022-06-29 MED ORDER — MAGNESIUM SULFATE 2 GM/50ML IV SOLN
2.0000 g | Freq: Once | INTRAVENOUS | Status: AC
  Administered 2022-06-29: 2 g via INTRAVENOUS
  Filled 2022-06-29: qty 50

## 2022-06-29 MED ORDER — SODIUM CHLORIDE 0.9 % IV SOLN: Freq: Once | INTRAVENOUS | Status: AC

## 2022-06-29 MED ORDER — SODIUM CHLORIDE 0.9 % IV SOLN
150.0000 mg | Freq: Once | INTRAVENOUS | Status: AC
  Administered 2022-06-29: 150 mg via INTRAVENOUS
  Filled 2022-06-29: qty 150

## 2022-06-29 MED ORDER — HEPARIN SOD (PORK) LOCK FLUSH 100 UNIT/ML IV SOLN
500.0000 [IU] | Freq: Once | INTRAVENOUS | Status: AC | PRN
  Administered 2022-06-29: 500 [IU]

## 2022-06-29 MED ORDER — SODIUM CHLORIDE 0.9 % IV SOLN
200.0000 mg | Freq: Once | INTRAVENOUS | Status: AC
  Administered 2022-06-29: 200 mg via INTRAVENOUS
  Filled 2022-06-29: qty 8

## 2022-06-29 NOTE — Progress Notes (Signed)
Patient presents today for C1D1 Keytruda, Alimta, and Carboplatin infusion. Patient is in satisfactory condition with no new complaints voiced.  Vital signs are stable.  Labs reviewed by Dr. Ellin Saba during the office visit and all labs are within treatment parameters. Pt's magnesium is 1.6 today. Pt will receive 2g IV magnesium sulfate per Dr.K's standing orders. Carboplatin and Alimta will be  dose-reduced by 20%. We will proceed with treatment per MD orders.   Treatment given today per MD orders. Tolerated infusion without adverse affects. Vital signs stable. No complaints at this time. Discharged from clinic ambulatory in stable condition. Alert and oriented x 3. F/U with [facility] as scheduled.

## 2022-06-29 NOTE — Patient Instructions (Signed)
[facility]  Discharge Instructions: Thank you for choosing [facility] to provide your oncology and hematology care.  If you have a lab appointment with the Cancer Center - please note that after April 8th, 2024, all labs will be drawn in the cancer center.  You do not have to check in or register with the main entrance as you have in the past but will complete your check-in in the cancer center.  Wear comfortable clothing and clothing appropriate for easy access to any Portacath or PICC line.   We strive to give you quality time with your provider. You may need to reschedule your appointment if you arrive late (15 or more minutes).  Arriving late affects you and other patients whose appointments are after yours.  Also, if you miss three or more appointments without notifying the office, you may be dismissed from the clinic at the provider's discretion.      For prescription refill requests, have your pharmacy contact our office and allow 72 hours for refills to be completed.    Today you received the following chemotherapy and/or immunotherapy agents Keytruda, Alimta, and Carboplatin   To help prevent nausea and vomiting after your treatment, we encourage you to take your nausea medication as directed.  Pembrolizumab Injection What is this medication? PEMBROLIZUMAB (PEM broe LIZ ue mab) treats some types of cancer. It works by helping your immune system slow or stop the spread of cancer cells. It is a monoclonal antibody. This medicine may be used for other purposes; ask your health care provider or pharmacist if you have questions. COMMON BRAND NAME(S): Keytruda What should I tell my care team before I take this medication? They need to know if you have any of these conditions: Allogeneic stem cell transplant (uses someone else's stem cells) Autoimmune diseases, such as Crohn disease, ulcerative colitis, lupus History of chest radiation Nervous system  problems, such as Guillain-Barre syndrome, myasthenia gravis Organ transplant An unusual or allergic reaction to pembrolizumab, other medications, foods, dyes, or preservatives Pregnant or trying to get pregnant Breast-feeding How should I use this medication? This medication is injected into a vein. It is given by your care team in a hospital or clinic setting. A special MedGuide will be given to you before each treatment. Be sure to read this information carefully each time. Talk to your care team about the use of this medication in children. While it may be prescribed for children as young as 6 months for selected conditions, precautions do apply. Overdosage: If you think you have taken too much of this medicine contact a poison control center or emergency room at once. NOTE: This medicine is only for you. Do not share this medicine with others. What if I miss a dose? Keep appointments for follow-up doses. It is important not to miss your dose. Call your care team if you are unable to keep an appointment. What may interact with this medication? Interactions have not been studied. This list may not describe all possible interactions. Give your health care provider a list of all the medicines, herbs, non-prescription drugs, or dietary supplements you use. Also tell them if you smoke, drink alcohol, or use illegal drugs. Some items may interact with your medicine. What should I watch for while using this medication? Your condition will be monitored carefully while you are receiving this medication. You may need blood work while taking this medication. This medication may cause serious skin reactions. They can happen  weeks to months after starting the medication. Contact your care team right away if you notice fevers or flu-like symptoms with a rash. The rash may be red or purple and then turn into blisters or peeling of the skin. You may also notice a red rash with swelling of the face, lips, or  lymph nodes in your neck or under your arms. Tell your care team right away if you have any change in your eyesight. Talk to your care team if you may be pregnant. Serious birth defects can occur if you take this medication during pregnancy and for 4 months after the last dose. You will need a negative pregnancy test before starting this medication. Contraception is recommended while taking this medication and for 4 months after the last dose. Your care team can help you find the option that works for you. Do not breastfeed while taking this medication and for 4 months after the last dose. What side effects may I notice from receiving this medication? Side effects that you should report to your care team as soon as possible: Allergic reactions--skin rash, itching, hives, swelling of the face, lips, tongue, or throat Dry cough, shortness of breath or trouble breathing Eye pain, redness, irritation, or discharge with blurry or decreased vision Heart muscle inflammation--unusual weakness or fatigue, shortness of breath, chest pain, fast or irregular heartbeat, dizziness, swelling of the ankles, feet, or hands Hormone gland problems--headache, sensitivity to light, unusual weakness or fatigue, dizziness, fast or irregular heartbeat, increased sensitivity to cold or heat, excessive sweating, constipation, hair loss, increased thirst or amount of urine, tremors or shaking, irritability Infusion reactions--chest pain, shortness of breath or trouble breathing, feeling faint or lightheaded Kidney injury (glomerulonephritis)--decrease in the amount of urine, red or dark brown urine, foamy or bubbly urine, swelling of the ankles, hands, or feet Liver injury--right upper belly pain, loss of appetite, nausea, light-colored stool, dark yellow or brown urine, yellowing skin or eyes, unusual weakness or fatigue Pain, tingling, or numbness in the hands or feet, muscle weakness, change in vision, confusion or trouble  speaking, loss of balance or coordination, trouble walking, seizures Rash, fever, and swollen lymph nodes Redness, blistering, peeling, or loosening of the skin, including inside the mouth Sudden or severe stomach pain, bloody diarrhea, fever, nausea, vomiting Side effects that usually do not require medical attention (report to your care team if they continue or are bothersome): Bone, joint, or muscle pain Diarrhea Fatigue Loss of appetite Nausea Skin rash This list may not describe all possible side effects. Call your doctor for medical advice about side effects. You may report side effects to FDA at 1-800-FDA-1088. Where should I keep my medication? This medication is given in a hospital or clinic. It will not be stored at home. NOTE: This sheet is a summary. It may not cover all possible information. If you have questions about this medicine, talk to your doctor, pharmacist, or health care provider.  2024 Elsevier/Gold Standard (2021-05-31 00:00:00)  Pemetrexed Injection What is this medication? PEMETREXED (PEM e TREX ed) treats some types of cancer. It works by slowing down the growth of cancer cells. This medicine may be used for other purposes; ask your health care provider or pharmacist if you have questions. COMMON BRAND NAME(S): Alimta, PEMFEXY What should I tell my care team before I take this medication? They need to know if you have any of these conditions: Infection, such as chickenpox, cold sores, or herpes Kidney disease Low blood cell levels (white cells,  red cells, and platelets) Lung or breathing disease, such as asthma Radiation therapy An unusual or allergic reaction to pemetrexed, other medications, foods, dyes, or preservatives If you or your partner are pregnant or trying to get pregnant Breast-feeding How should I use this medication? This medication is injected into a vein. It is given by your care team in a hospital or clinic setting. Talk to your care  team about the use of this medication in children. Special care may be needed. Overdosage: If you think you have taken too much of this medicine contact a poison control center or emergency room at once. NOTE: This medicine is only for you. Do not share this medicine with others. What if I miss a dose? Keep appointments for follow-up doses. It is important not to miss your dose. Call your care team if you are unable to keep an appointment. What may interact with this medication? Do not take this medication with any of the following: Live virus vaccines This medication may also interact with the following: Ibuprofen This list may not describe all possible interactions. Give your health care provider a list of all the medicines, herbs, non-prescription drugs, or dietary supplements you use. Also tell them if you smoke, drink alcohol, or use illegal drugs. Some items may interact with your medicine. What should I watch for while using this medication? Your condition will be monitored carefully while you are receiving this medication. This medication may make you feel generally unwell. This is not uncommon as chemotherapy can affect healthy cells as well as cancer cells. Report any side effects. Continue your course of treatment even though you feel ill unless your care team tells you to stop. This medication can cause serious side effects. To reduce the risk, your care team may give you other medications to take before receiving this one. Be sure to follow the directions from your care team. This medication can cause a rash or redness in areas of the body that have previously had radiation therapy. If you have had radiation therapy, tell your care team if you notice a rash in this area. This medication may increase your risk of getting an infection. Call your care team for advice if you get a fever, chills, sore throat, or other symptoms of a cold or flu. Do not treat yourself. Try to avoid being around  people who are sick. Be careful brushing or flossing your teeth or using a toothpick because you may get an infection or bleed more easily. If you have any dental work done, tell your dentist you are receiving this medication. Avoid taking medications that contain aspirin, acetaminophen, ibuprofen, naproxen, or ketoprofen unless instructed by your care team. These medications may hide a fever. Check with your care team if you have severe diarrhea, nausea, and vomiting, or if you sweat a lot. The loss of too much body fluid may make it dangerous for you to take this medication. Talk to your care team if you or your partner wish to become pregnant or think either of you might be pregnant. This medication can cause serious birth defects if taken during pregnancy and for 6 months after the last dose. A negative pregnancy test is required before starting this medication. A reliable form of contraception is recommended while taking this medication and for 6 months after the last dose. Talk to your care team about reliable forms of contraception. Do not father a child while taking this medication and for 3 months after the last  dose. Use a condom while having sex during this time period. Do not breastfeed while taking this medication and for 1 week after the last dose. This medication may cause infertility. Talk to your care team if you are concerned about your fertility. What side effects may I notice from receiving this medication? Side effects that you should report to your care team as soon as possible: Allergic reactions--skin rash, itching, hives, swelling of the face, lips, tongue, or throat Dry cough, shortness of breath or trouble breathing Infection--fever, chills, cough, sore throat, wounds that don't heal, pain or trouble when passing urine, general feeling of discomfort or being unwell Kidney injury--decrease in the amount of urine, swelling of the ankles, hands, or feet Low red blood cell  level--unusual weakness or fatigue, dizziness, headache, trouble breathing Redness, blistering, peeling, or loosening of the skin, including inside the mouth Unusual bruising or bleeding Side effects that usually do not require medical attention (report to your care team if they continue or are bothersome): Fatigue Loss of appetite Nausea Vomiting This list may not describe all possible side effects. Call your doctor for medical advice about side effects. You may report side effects to FDA at 1-800-FDA-1088. Where should I keep my medication? This medication is given in a hospital or clinic. It will not be stored at home. NOTE: This sheet is a summary. It may not cover all possible information. If you have questions about this medicine, talk to your doctor, pharmacist, or health care provider.  2024 Elsevier/Gold Standard (2021-05-24 00:00:00)  Carboplatin Injection What is this medication? CARBOPLATIN (KAR boe pla tin) treats some types of cancer. It works by slowing down the growth of cancer cells. This medicine may be used for other purposes; ask your health care provider or pharmacist if you have questions. COMMON BRAND NAME(S): Paraplatin What should I tell my care team before I take this medication? They need to know if you have any of these conditions: Blood disorders Hearing problems Kidney disease Recent or ongoing radiation therapy An unusual or allergic reaction to carboplatin, cisplatin, other medications, foods, dyes, or preservatives Pregnant or trying to get pregnant Breast-feeding How should I use this medication? This medication is injected into a vein. It is given by your care team in a hospital or clinic setting. Talk to your care team about the use of this medication in children. Special care may be needed. Overdosage: If you think you have taken too much of this medicine contact a poison control center or emergency room at once. NOTE: This medicine is only for  you. Do not share this medicine with others. What if I miss a dose? Keep appointments for follow-up doses. It is important not to miss your dose. Call your care team if you are unable to keep an appointment. What may interact with this medication? Medications for seizures Some antibiotics, such as amikacin, gentamicin, neomycin, streptomycin, tobramycin Vaccines This list may not describe all possible interactions. Give your health care provider a list of all the medicines, herbs, non-prescription drugs, or dietary supplements you use. Also tell them if you smoke, drink alcohol, or use illegal drugs. Some items may interact with your medicine. What should I watch for while using this medication? Your condition will be monitored carefully while you are receiving this medication. You may need blood work while taking this medication. This medication may make you feel generally unwell. This is not uncommon, as chemotherapy can affect healthy cells as well as cancer cells. Report any  side effects. Continue your course of treatment even though you feel ill unless your care team tells you to stop. In some cases, you may be given additional medications to help with side effects. Follow all directions for their use. This medication may increase your risk of getting an infection. Call your care team for advice if you get a fever, chills, sore throat, or other symptoms of a cold or flu. Do not treat yourself. Try to avoid being around people who are sick. Avoid taking medications that contain aspirin, acetaminophen, ibuprofen, naproxen, or ketoprofen unless instructed by your care team. These medications may hide a fever. Be careful brushing or flossing your teeth or using a toothpick because you may get an infection or bleed more easily. If you have any dental work done, tell your dentist you are receiving this medication. Talk to your care team if you wish to become pregnant or think you might be pregnant.  This medication can cause serious birth defects. Talk to your care team about effective forms of contraception. Do not breast-feed while taking this medication. What side effects may I notice from receiving this medication? Side effects that you should report to your care team as soon as possible: Allergic reactions--skin rash, itching, hives, swelling of the face, lips, tongue, or throat Infection--fever, chills, cough, sore throat, wounds that don't heal, pain or trouble when passing urine, general feeling of discomfort or being unwell Low red blood cell level--unusual weakness or fatigue, dizziness, headache, trouble breathing Pain, tingling, or numbness in the hands or feet, muscle weakness, change in vision, confusion or trouble speaking, loss of balance or coordination, trouble walking, seizures Unusual bruising or bleeding Side effects that usually do not require medical attention (report to your care team if they continue or are bothersome): Hair loss Nausea Unusual weakness or fatigue Vomiting This list may not describe all possible side effects. Call your doctor for medical advice about side effects. You may report side effects to FDA at 1-800-FDA-1088. Where should I keep my medication? This medication is given in a hospital or clinic. It will not be stored at home. NOTE: This sheet is a summary. It may not cover all possible information. If you have questions about this medicine, talk to your doctor, pharmacist, or health care provider.  2024 Elsevier/Gold Standard (2021-05-10 00:00:00)    BELOW ARE SYMPTOMS THAT SHOULD BE REPORTED IMMEDIATELY: *FEVER GREATER THAN 100.4 F (38 C) OR HIGHER *CHILLS OR SWEATING *NAUSEA AND VOMITING THAT IS NOT CONTROLLED WITH YOUR NAUSEA MEDICATION *UNUSUAL SHORTNESS OF BREATH *UNUSUAL BRUISING OR BLEEDING *URINARY PROBLEMS (pain or burning when urinating, or frequent urination) *BOWEL PROBLEMS (unusual diarrhea, constipation, pain near the  anus) TENDERNESS IN MOUTH AND THROAT WITH OR WITHOUT PRESENCE OF ULCERS (sore throat, sores in mouth, or a toothache) UNUSUAL RASH, SWELLING OR PAIN  UNUSUAL VAGINAL DISCHARGE OR ITCHING   Items with * indicate a potential emergency and should be followed up as soon as possible or go to the Emergency Department if any problems should occur.  Please show the CHEMOTHERAPY ALERT CARD or IMMUNOTHERAPY ALERT CARD at check-in to the Emergency Department and triage nurse.  Should you have questions after your visit or need to cancel or reschedule your appointment, please contact [facility] [phone]  and follow the prompts.  Office hours are 8:00 a.m. to 4:30 p.m. Monday - Friday. Please note that voicemails left after 4:00 p.m. may not be returned until the following business day.  We  are closed weekends and major holidays. You have access to a nurse at all times for urgent questions. Please call the main number to the clinic [phone] and follow the prompts.  For any non-urgent questions, you may also contact your provider using MyChart. We now offer e-Visits for anyone 8 and older to request care online for non-urgent symptoms. For details visit [email or web address].   Also download the MyChart app! Go to the app store, search "MyChart", open the app, select [place], and log in with your MyChart username and password.

## 2022-06-29 NOTE — Patient Instructions (Addendum)
[facility] at [facility] Discharge Instructions   You were seen and examined today by Dr. Ellin Saba.  He reviewed the results of your lab work which are normal/stable with the exception of one of the liver enzymes called alkaline phosphatase. It is very high today. We will repeat this test today to verify the result.   He reviewed the results of your PET scan. It is showing there is a cancer spot in the right lung. The scan of your head did not show any cancer spread to the brain.   We will proceed with your treatment today.   Decrease the steroid (dexamethasone) to one pill once a day.   Return as scheduled.    Thank you for choosing [facility] at [facility] to provide your oncology and hematology care.  To afford each patient quality time with our provider, please arrive at least 15 minutes before your scheduled appointment time.   If you have a lab appointment with the Cancer Center please come in thru the Main Entrance and check in at the main information desk.  You need to re-schedule your appointment should you arrive 10 or more minutes late.  We strive to give you quality time with our providers, and arriving late affects you and other patients whose appointments are after yours.  Also, if you no show three or more times for appointments you may be dismissed from the clinic at the providers discretion.     Again, thank you for choosing [facility].  Our hope is that these requests will decrease the amount of time that you wait before being seen by our physicians.       _____________________________________________________________  Should you have questions after your visit to [facility], please contact our office at [phone] and follow the prompts.  Our office hours are 8:00 a.m. and 4:30 p.m. Monday - Friday.  Please note that voicemails left after 4:00 p.m. may not be returned until the following  business day.  We are closed weekends and major holidays.  You do have access to a nurse 24-7, just call the main number to the clinic [phone] and do not press any options, hold on the line and a nurse will answer the phone.    For prescription refill requests, have your pharmacy contact our office and allow 72 hours.    Due to Covid, you will need to wear a mask upon entering the hospital. If you do not have a mask, a mask will be given to you at the Main Entrance upon arrival. For doctor visits, patients may have 1 support person age 59 or older with them. For treatment visits, patients can not have anyone with them due to social distancing guidelines and our immunocompromised population.

## 2022-06-29 NOTE — Progress Notes (Signed)
Patient has been examined by Dr. Katragadda. Vital signs (HR 102) and labs have been reviewed by MD - ANC, Creatinine, LFTs, hemoglobin, and platelets are within treatment parameters per M.D. - pt may proceed with treatment.  Primary RN and pharmacy notified.  

## 2022-07-01 LAB — T4: T4, Total: 10.9 ug/dL (ref 4.5–12.0)

## 2022-07-03 ENCOUNTER — Other Ambulatory Visit: Payer: Self-pay

## 2022-07-03 MED ORDER — TRAMADOL HCL 100 MG PO TABS: 100.0000 mg | ORAL_TABLET | Freq: Two times a day (BID) | ORAL | 0 refills | Status: DC | PRN

## 2022-07-03 NOTE — Progress Notes (Signed)
POST CHEMOTHERAPY CALL BACK:  Patient has been feeling well since chemotherapy treatment.  She does c/o slight fatigue and constipation.  She is is treating the constipation at home.  Refill request for Tramadol sent to Dr. Ellin Saba.  Advised to call back with any new concerns.

## 2022-07-07 NOTE — Progress Notes (Unsigned)
[facility] [address], [place] [address] Phone: [phone] Fax: [phone]  SYMPTOM MANAGEMENT CLINIC PROGRESS NOTE   Rachel Gross [id number] [birth year] 57 y.o.  Rachel Gross is managed by Dr. Ellin Saba for metastatic lung adenocarcinoma  Actively treated with chemotherapy/immunotherapy/hormonal therapy: YES  Current therapy: Carboplatin + pemetrexed + pembrolizumab  Last treated: Day #1 of cycle #1 on 06/29/2022  INTERVAL HISTORY:  Chief Complaint: Symptom management & chemotherapy follow-up visit  Her chief complaint is feeling extremely fatigued ever since she received chemotherapy.  She reports that her appetite is "normal" (75%), but that she will sometimes only eat one meal a day or even though her entire day without eating, which has been "just how she always is."  She is drinking about 32 ounces of water daily.  Her weight is overall stable.  She reports that she had a subjective fever x 1 day last week associated with self diagnosed dental abscess.  Fever and pain resolved after abscess burst and self-drained last week.  She has appointment with dentist on 07/18/2022.  Her back pain is currently well-controlled by taking tramadol once or twice a day.   PERTINENT POSITIVES: Fatigue, easy bruising.  PERTINENT NEGATIVES: No nausea, vomiting, diarrhea, mouth sores, urine changes, rashes, peripheral edema, chest pain, shortness of breath, dyspnea, neuropathy, bleeding, new onset pain.  ASSESSMENT & PLAN:  ## METASTATIC LUNG ADENOCARCINOMA - Primary medical oncologist is Dr. Ellin Saba - Initially diagnosed in 2021, with completion of 4 cycles of carboplatin and pemetrexed - CT chest in November 2023 showed lytic metastatic lesions and pulmonary nodules - PET scan (01/19/2022): Cavitary nodule of concern in RML lung and expansile lytic metastasis in left fifth rib (biopsy-proven moderately to poorly differentiated adenocarcinoma on  02/06/2022) - She completed XRT to the left rib lesion. - PET scan (06/22/2022): 18 mm right middle lobe nodule suspicious for Metoclomex primary versus metastases.  Osseous metastases at T5 body and left posterior lateral fifth rib. - SBRT to T5 lesion from 06/12/2022 through 06/16/2022.   - Initiated on treatment with carboplatin, pemetrexed, and pembrolizumab as of 06/29/2022 - Labs today (07/10/2022) are grossly unremarkable with normal CBC/D, normal magnesium, normal potassium and normal kidney function.  Alkaline phosphatase and other liver enzymes are normal.  She does have mild hypoalbuminemia at 3.3. - PLAN: Next scheduled visit with Dr. Ellin Saba on Thursday, 07/20/2022.  # Dental issues - Reports that she had a subjective fever x 1 day last week associated with self diagnosed dental abscess.  Fever and pain resolved after abscess burst and self-drained last week.  She has appointment with dentist on 07/18/2022. - No lesions or drainage noted on oral exam, but she does have some tenderness of her left maxillary region. - PLAN: Follow-up with dentist as scheduled on 07/18/2022.  Patient advised to call Cancer Center or proceed to Emergency Department if she has any recurrent fever or abscess.  # Left mid back pain - Completed radiation therapy to T5 lesion on 06/16/2022 - Currently on dexamethasone taper (2 mg every other day until completed)  - She is taking tramadol once or twice daily as needed for pain  # Hypomagnesemia - Started on magnesium 400 mg daily on 06/29/2022 - Magnesium today was normal at 1.8 - PLAN: Continue daily magnesium.  # Nutrition & Hydration - Weight is stable, but patient has subpar nutritional and hydration oral intake. - PLAN: Patient advised to eat small meals throughout the day and supplement diet with Boost  or Ensure if she is unable to maintain adequate caloric intake. - Advised to drink 64 ounces of water daily.  PLAN SUMMARY: >> Next scheduled appointment  with medical oncologist: Office visit with Dr. Ellin Saba on 07/20/2022   REVIEW OF SYSTEMS:   Review of Systems  Constitutional:  Positive for activity change, chills and fatigue. Negative for appetite change, diaphoresis, fever and unexpected weight change.  HENT:  Positive for dental problem and trouble swallowing. Negative for mouth sores, nosebleeds, sore throat and tinnitus.   Respiratory:  Negative for cough and shortness of breath.   Cardiovascular:  Negative for chest pain, palpitations and leg swelling.  Gastrointestinal:  Positive for constipation. Negative for abdominal pain, blood in stool, diarrhea, nausea and vomiting.  Genitourinary:  Negative for dysuria and hematuria.  Musculoskeletal:  Positive for arthralgias and back pain.  Neurological:  Positive for dizziness and headaches. Negative for light-headedness and numbness.  Psychiatric/Behavioral:  Positive for dysphoric mood. Negative for sleep disturbance. The patient is nervous/anxious.    Past Medical History, Surgical history, Social history, and Family history were reviewed as documented elsewhere in chart, and were updated as appropriate.   OBJECTIVE:  Physical Exam:  There were no vitals taken for this visit. ECOG: 2  Physical Exam Constitutional:      Appearance: Normal appearance. She is obese.  Cardiovascular:     Rate and Rhythm: Tachycardia present.     Heart sounds: Normal heart sounds.  Pulmonary:     Breath sounds: Decreased breath sounds present.  Neurological:     General: No focal deficit present.     Mental Status: Mental status is at baseline.  Psychiatric:        Behavior: Behavior normal. Behavior is cooperative.     Lab Review:     Component Value Date/Time   NA 133 (L) 06/29/2022 0831   K 3.5 06/29/2022 0831   CL 95 (L) 06/29/2022 0831   CO2 27 06/29/2022 0831   GLUCOSE 130 (H) 06/29/2022 0831   BUN 14 06/29/2022 0831   CREATININE 0.59 06/29/2022 0831   CREATININE 0.45 (L)  09/10/2017 1422   CALCIUM 8.7 (L) 06/29/2022 0831   PROT 6.9 06/29/2022 0831   ALBUMIN 3.7 06/29/2022 0831   AST 19 06/29/2022 0831   ALT 23 06/29/2022 0831   ALKPHOS 48 06/29/2022 0831   BILITOT 0.8 06/29/2022 0831   GFRNONAA >60 06/29/2022 0831   GFRAA >60 10/21/2019 0942       Component Value Date/Time   WBC 11.4 (H) 06/29/2022 0831   RBC 4.41 06/29/2022 0831   HGB 14.8 06/29/2022 0831   HCT 43.7 06/29/2022 0831   PLT 139 (L) 06/29/2022 0831   MCV 99.1 06/29/2022 0831   MCH 33.6 06/29/2022 0831   MCHC 33.9 06/29/2022 0831   RDW 13.6 06/29/2022 0831   LYMPHSABS 1.4 06/29/2022 0831   MONOABS 0.8 06/29/2022 0831   EOSABS 0.0 06/29/2022 0831   BASOSABS 0.0 06/29/2022 0831   -------------------------------  Imaging from last 24 hours (if applicable): Radiology interpretation: NM PET Image Restag (PS) Skull Base To Thigh  Result Date: 06/28/2022 CLINICAL DATA:  Subsequent treatment strategy for right lung cancer. EXAM: NUCLEAR MEDICINE PET SKULL BASE TO THIGH TECHNIQUE: 7.3 mCi F-18 FDG was injected intravenously. Full-ring PET imaging was performed from the skull base to thigh after the radiotracer. CT data was obtained and used for attenuation correction and anatomic localization. Fasting blood glucose: 123 mg/dl COMPARISON:  Thoracic spine CT dated 06/05/2022. CT chest  dated 05/29/2022. PET-CT dated 01/19/2022. FINDINGS: Mediastinal blood pool activity: SUV max 2.1 Liver activity: SUV max NA NECK: No hypermetabolic cervical lymphadenopathy. Incidental CT findings: None. CHEST: Status post right lower lobectomy. 8 x 18 mm posterior right middle lobe nodule (series 7/image 39), max SUV 3.3, suspicious for metachronous primary bronchogenic carcinoma versus metastasis. Additional 5 mm left lower lobe nodule (series 7/image 65), non FDG avid but beneath the size threshold for PET sensitivity. No hypermetabolic thoracic lymphadenopathy. Left chest port terminates at the cavoatrial  junction. Incidental CT findings: Atherosclerotic calcifications of the aortic arch. Mild coronary atherosclerosis of the LAD. ABDOMEN/PELVIS: No abnormal hypermetabolism in the liver, spleen, pancreas, or adrenal glands. No hypermetabolic abdominopelvic lymphadenopathy. Incidental CT findings: Atherosclerotic calcifications of the abdominal aorta and branch vessels. Extensive colonic diverticulosis, without evidence of diverticulitis. SKELETON: Sclerotic lesion at T5 (series 3/image 82), max SUV 2.7, corresponding to the patient's known osseous metastasis. Expansile lytic osseous metastasis involving the left posterolateral 5th rib (series 3/image 86), max SUV 3.4. Incidental CT findings: None. IMPRESSION: Status post right lower lobectomy. 18 mm posterior right middle lobe nodule, suspicious for metachronous primary bronchogenic carcinoma versus metastasis. Osseous metastases involving the T5 vertebral body and left posterolateral 5th rib. Findings are unchanged from recent CT chest but progressive from prior PET. Electronically Signed   By: Charline Bills M.D.   On: 06/28/2022 00:58      WRAP UP:  All questions were answered. The patient knows to call the clinic with any problems, questions or concerns.  Medical decision making: Moderate  Time spent on visit: I spent 20 minutes counseling the patient face to face. The total time spent in the appointment was 30 minutes and more than 50% was on counseling.  Carnella Guadalajara, PA-C  07/10/22 11:26 AM

## 2022-07-10 ENCOUNTER — Inpatient Hospital Stay: Payer: Medicaid Other | Attending: Hematology

## 2022-07-10 ENCOUNTER — Inpatient Hospital Stay (HOSPITAL_BASED_OUTPATIENT_CLINIC_OR_DEPARTMENT_OTHER): Payer: Medicaid Other | Admitting: Physician Assistant

## 2022-07-10 VITALS — BP 115/58 | HR 103 | Temp 97.7°F | Resp 20 | Wt 152.4 lb

## 2022-07-10 DIAGNOSIS — M546 Pain in thoracic spine: Secondary | ICD-10-CM | POA: Insufficient documentation

## 2022-07-10 DIAGNOSIS — Z09 Encounter for follow-up examination after completed treatment for conditions other than malignant neoplasm: Secondary | ICD-10-CM

## 2022-07-10 DIAGNOSIS — Z5112 Encounter for antineoplastic immunotherapy: Secondary | ICD-10-CM | POA: Diagnosis present

## 2022-07-10 DIAGNOSIS — Z452 Encounter for adjustment and management of vascular access device: Secondary | ICD-10-CM | POA: Insufficient documentation

## 2022-07-10 DIAGNOSIS — C7951 Secondary malignant neoplasm of bone: Secondary | ICD-10-CM | POA: Diagnosis not present

## 2022-07-10 DIAGNOSIS — C3431 Malignant neoplasm of lower lobe, right bronchus or lung: Secondary | ICD-10-CM | POA: Insufficient documentation

## 2022-07-10 DIAGNOSIS — Z5111 Encounter for antineoplastic chemotherapy: Secondary | ICD-10-CM | POA: Diagnosis present

## 2022-07-10 DIAGNOSIS — Z95828 Presence of other vascular implants and grafts: Secondary | ICD-10-CM

## 2022-07-10 DIAGNOSIS — C3491 Malignant neoplasm of unspecified part of right bronchus or lung: Secondary | ICD-10-CM

## 2022-07-10 DIAGNOSIS — E8809 Other disorders of plasma-protein metabolism, not elsewhere classified: Secondary | ICD-10-CM | POA: Diagnosis not present

## 2022-07-10 LAB — COMPREHENSIVE METABOLIC PANEL WITH GFR
ALT: 35 U/L (ref 0–44)
AST: 41 U/L (ref 15–41)
Albumin: 3.3 g/dL — ABNORMAL LOW (ref 3.5–5.0)
Alkaline Phosphatase: 44 U/L (ref 38–126)
Anion gap: 9 (ref 5–15)
BUN: 6 mg/dL (ref 6–20)
CO2: 29 mmol/L (ref 22–32)
Calcium: 8.7 mg/dL — ABNORMAL LOW (ref 8.9–10.3)
Chloride: 97 mmol/L — ABNORMAL LOW (ref 98–111)
Creatinine, Ser: 0.46 mg/dL (ref 0.44–1.00)
GFR, Estimated: >60 mL/min
Glucose, Bld: 132 mg/dL — ABNORMAL HIGH (ref 70–99)
Potassium: 3.9 mmol/L (ref 3.5–5.1)
Sodium: 135 mmol/L (ref 135–145)
Total Bilirubin: 0.4 mg/dL (ref 0.3–1.2)
Total Protein: 7 g/dL (ref 6.5–8.1)

## 2022-07-10 LAB — CBC WITH DIFFERENTIAL/PLATELET
Abs Immature Granulocytes: 0.05 10*3/uL (ref 0.00–0.07)
Basophils Absolute: 0 10*3/uL (ref 0.0–0.1)
Basophils Relative: 0 %
Eosinophils Absolute: 0 10*3/uL (ref 0.0–0.5)
Eosinophils Relative: 0 %
HCT: 38.1 % (ref 36.0–46.0)
Hemoglobin: 12.8 g/dL (ref 12.0–15.0)
Immature Granulocytes: 1 %
Lymphocytes Relative: 23 %
Lymphs Abs: 1.3 10*3/uL (ref 0.7–4.0)
MCH: 33.5 pg (ref 26.0–34.0)
MCHC: 33.6 g/dL (ref 30.0–36.0)
MCV: 99.7 fL (ref 80.0–100.0)
Monocytes Absolute: 0.7 10*3/uL (ref 0.1–1.0)
Monocytes Relative: 12 %
Neutro Abs: 3.6 10*3/uL (ref 1.7–7.7)
Neutrophils Relative %: 64 %
Platelets: 166 10*3/uL (ref 150–400)
RBC: 3.82 MIL/uL — ABNORMAL LOW (ref 3.87–5.11)
RDW: 12.9 % (ref 11.5–15.5)
WBC: 5.7 10*3/uL (ref 4.0–10.5)
nRBC: 0 % (ref 0.0–0.2)

## 2022-07-10 LAB — MAGNESIUM: Magnesium: 1.8 mg/dL (ref 1.7–2.4)

## 2022-07-10 MED ORDER — SODIUM CHLORIDE 0.9% FLUSH
10.0000 mL | Freq: Once | INTRAVENOUS | Status: AC
  Administered 2022-07-10: 10 mL via INTRAVENOUS

## 2022-07-10 MED ORDER — HEPARIN SOD (PORK) LOCK FLUSH 100 UNIT/ML IV SOLN
500.0000 [IU] | Freq: Once | INTRAVENOUS | Status: AC
  Administered 2022-07-10: 500 [IU] via INTRAVENOUS

## 2022-07-10 NOTE — Progress Notes (Signed)
Port flushed with good blood return noted. No bruising or swelling at site. Bandaid applied and patient sent to waiting room for office visit. VVS stable with no signs or symptoms of distressed noted.

## 2022-07-10 NOTE — Patient Instructions (Addendum)
[facility] at [facility] **VISIT SUMMARY & IMPORTANT INSTRUCTIONS **   You were seen today by Rojelio Brenner PA-C for your symptom management and chemotherapy follow-up visit.    DEHYDRATION: It is very important that you remain hydrated while you are receiving treatment!   Make sure that you are drinking at least 64 ounces of water each day.  You can drink juice, decaffeinated drinks, and sugar free beverages in addition to water, but should still drink plenty of water. Limit the amount of soda you drink. Although plain water is the best for you, you can try adding sugar-free/caffeine-free flavor packs (ex: Pedialyte) to change the flavor. Popsicles, soup, and ice cream are great ways to add even more fluids! Mix your juice and Gatorade with some water to increase your water intake. If you are unable to drink enough water, or are losing fluids through vomiting or diarrhea, please CALL THE CANCER CENTER. If you are severely dehydrated, you may have symptoms such as low blood pressure, increased weakness, lightheadedness with standing, passing out, or feeling that your heartbeat is racing.  If the symptoms occur, please CALL THE CANCER CENTER.  If the symptoms are severe or occur outside of business hours, proceed to the emergency department.   NUTRITION & WEIGHT LOSS: It is extremely important that you have good nutrition while you are fighting cancer with chemotherapy! Try to eat small frequent meals throughout the day. Include a healthy source of protein such as cheese, yogurt, eggs, lean meats, beans, nuts, or nut-butters. Eat a snack at night before bedtime. Drink Boost or Ensure once or twice daily if you are not able to eat full meals throughout the day.  DENTAL ISSUES Keep your appointment as scheduled with dentist next week (07/18/2022). If you have any fever, chills, or worsening pain before that time, please call our office and/or go to the Emergency  Department.   MEDICATIONS:  Continue to take magnesium 400 mg daily. Continue tramadol as needed for pain (100 mg 1-2 times daily)  FOLLOW-UP APPOINTMENT: Office visit with Dr. Ellin Saba on Thursday, 07/20/2022  ** Thank you for trusting me with your healthcare!  I strive to provide all of my patients with quality care at each visit.  If you receive a survey for this visit, I would be so grateful to you for taking the time to provide feedback.  Thank you in advance!  ~ Elfrida Pixley                   Dr. Doreatha Massed   &   Rojelio Brenner, PA-C   - - - - - - - - - - - - - - - - - -    Thank you for choosing [facility] at [facility] to provide your oncology and hematology care.  To afford each patient quality time with our provider, please arrive at least 15 minutes before your scheduled appointment time.   If you have a lab appointment with the Cancer Center please come in thru the Main Entrance and check in at the main information desk.  You need to re-schedule your appointment should you arrive 10 or more minutes late.  We strive to give you quality time with our providers, and arriving late affects you and other patients whose appointments are after yours.  Also, if you no show three or more times for appointments you may be dismissed from the clinic at the providers discretion.  Again, thank you for choosing [facility].  Our hope is that these requests will decrease the amount of time that you wait before being seen by our physicians.       _____________________________________________________________  Should you have questions after your visit to [facility], please contact our office at [phone] and follow the prompts.  Our office hours are 8:00 a.m. and 4:30 p.m. Monday - Friday.  Please note that voicemails left after 4:00 p.m. may not be returned until the following business day.  We are closed weekends and major  holidays.  You do have access to a nurse 24-7, just call the main number to the clinic [phone] and do not press any options, hold on the line and a nurse will answer the phone.    For prescription refill requests, have your pharmacy contact our office and allow 72 hours.

## 2022-07-19 NOTE — Progress Notes (Signed)
[facility] [address] [place] [address]    Clinic Day:  07/20/2022  Referring physician: Benita Stabile, MD  Patient Care Team: Benita Stabile, MD as PCP - General (Internal Medicine) Jena Gauss Gerrit Friends, MD as Consulting Physician (Gastroenterology) Doreatha Massed, MD as Consulting Physician (Medical Oncology)   ASSESSMENT & PLAN:   Assessment: 1.  Metastatic lung adenocarcinoma: -PET scan on 05/13/2019 showed right lower lobe nodule concerning for bronchogenic carcinoma.  Cirrhotic liver. -Right lower lobectomy and lymph node excision on 06/19/2019. -Pathology showed 1.7 cm right lower lobe adenocarcinoma, unifocal, lymphovascular invasion present.  Margins negative.  2/14 lymph nodes positive (level 7 and 10R).  PT1CPN2. -PD-L1 30%, K-ras G12A, MSI stable.  EGFR mutation not identified. -4 cycles of adjuvant carboplatin and pemetrexed from 09/09/2019 through 11/11/2019. - CT chest on 12/29/2021: Lytic metastatic lesion in the posterior left fifth rib.  Interval enlargement of cavitary nodule of the dependent right upper lobe.  Numerous new clustered nodules of varying sizes. - PET scan (01/19/2022): Cavitary nodule of concern in the right middle lobe measures 1.3 x 0.7 cm, SUV max of 1.8.  No other hypermetabolic or suspicious lung nodules.  Expansile lytic metastasis involving left fifth rib hypermetabolic measuring 2.6 x 1.9 cm with SUV 4.2. - Left rib biopsy (02/06/2022): Metastatic moderately to poorly differentiated adenocarcinoma - NGS by [facility]: PD-L1 (22 C3)-TPS 5%.  TMB-high.  K-ras G12 A pathogenic variant.  T p53 pathogenic variant.  Other targetable mutations negative.  MSI-stable. - XRT to the left rib lesion completed - SBRT to the T5 lesion from 06/12/2022 through 06/16/2022 - Cycle 1 of carboplatin, pemetrexed and pembrolizumab on 06/29/2022    Plan: 1.  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 - She reported decreased energy.  She also had diarrhea up to 4-5 times per day which improved.  She had nausea on and off without vomiting. - We will send a prescription for Lomotil to be taken as needed for diarrhea. - Reviewed labs today: Normal LFTs and creatinine.  CBC grossly normal. - Will proceed with cycle 2 today with pembrolizumab decreased by 20% and carboplatin AUC 4.  RTC 3 weeks for follow-up.   2.  Left mid back pain: - She has completed XRT.  She is tapering off of dexamethasone 2 mg every other day. - Continue tramadol 50 mg as needed.   3.  Hypomagnesemia: - Continue magnesium 1 tablet daily.  Magnesium is normal today.    Orders Placed This Encounter  Procedures   Magnesium    Standing Status:   Future    Standing Expiration Date:   11/23/2023   CBC with Differential    Standing Status:   Future    Standing Expiration Date:   11/23/2023   Comprehensive metabolic panel    Standing Status:   Future    Standing Expiration Date:   11/23/2023   Magnesium    Standing Status:   Future    Standing Expiration Date:   12/14/2023   CBC with Differential    Standing Status:   Future    Standing Expiration Date:   12/14/2023   Comprehensive metabolic panel    Standing Status:   Future    Standing Expiration Date:   12/14/2023   TSH    Standing Status:   Future    Standing Expiration Date:   12/14/2023   Magnesium  Standing Status:   Future    Standing Expiration Date:   01/04/2024   CBC with Differential    Standing Status:   Future    Standing Expiration Date:   01/04/2024   Comprehensive metabolic panel    Standing Status:   Future    Standing Expiration Date:   01/04/2024   Magnesium    Standing Status:   Future    Standing Expiration Date:   01/25/2024   CBC with Differential    Standing Status:   Future    Standing Expiration Date:   01/25/2024   Comprehensive metabolic panel    Standing Status:   Future    Standing  Expiration Date:   01/25/2024      I,Katie Daubenspeck,acting as a scribe for Doreatha Massed, MD.,have documented all relevant documentation on the behalf of Doreatha Massed, MD,as directed by  Doreatha Massed, MD while in the presence of Doreatha Massed, MD.   I, Doreatha Massed MD, have reviewed the above documentation for accuracy and completeness, and I agree with the above.   Doreatha Massed, MD   6/20/20247:07 PM  CHIEF COMPLAINT:   Diagnosis: metastatic right lung cancer    Cancer Staging  Non-small cell cancer of right lung [facility]) Staging form: Lung, AJCC 8th Edition - Clinical stage from 02/13/2022: Stage IV (cT1c, cN2, pM1) - Signed by Doreatha Massed, MD on 02/13/2022    Prior Therapy: 1. Right lower lobectomy on 06/19/2019. 2. Carboplatin and pemetrexed x 4 cycles from 09/09/2019 to 11/11/2019 3. SBRT to left rib/chest wall lesion, completed 03/22/22 4. SBRT to T5 lesion, 06/12/22 - 06/16/22  Current Therapy:  carboplatin, pemetrexed and pembrolizumab    HISTORY OF PRESENT ILLNESS:   Oncology History  Adenocarcinoma of lung, stage 3, right (HCC)  07/02/2019 Initial Diagnosis   Adenocarcinoma of lung, stage 3, right (HCC)   07/18/2019 Genetic Testing   [facility]     07/23/2019 Genetic Testing   PD-L1     09/09/2019 - 11/11/2019 Chemotherapy   Patient is on Treatment Plan : LUNG NSCLC Pemetrexed (Alimta) / Carboplatin q21d x 4 cycles     Non-small cell cancer of right lung (HCC)  02/13/2022 Initial Diagnosis   Non-small cell cancer of right lung (HCC)   02/13/2022 Cancer Staging   Staging form: Lung, AJCC 8th Edition - Clinical stage from 02/13/2022: Stage IV (cT1c, cN2, pM1) - Signed by Doreatha Massed, MD on 02/13/2022 Histopathologic type: Adenocarcinoma, NOS Stage prefix: Initial diagnosis Histologic grade (G): G3 Histologic grading system: 4 grade system   06/29/2022 -  Chemotherapy   Patient is on Treatment Plan  : LUNG Carboplatin (5) + Pemetrexed (500) + Pembrolizumab (200) D1 q21d Induction x 4 cycles / Maintenance Pemetrexed (500) + Pembrolizumab (200) D1 q21d        INTERVAL HISTORY:   Brenton is a 57 y.o. female presenting to clinic today for follow up of metastatic right lung cancer. She was last seen by me on 06/29/22. She was also seen by PA Rebekah on 07/10/22 in our symptom management clinic.  Today, she states that she is doing well overall. Her appetite level is at 40%. Her energy level is at 40%.  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    Bipolar disorder (HCC)  Cancer (HCC)    right lung   COPD (chronic obstructive pulmonary disease) (HCC)    Depression    Dyspnea    Emphysema lung (HCC)    GERD (gastroesophageal reflux disease)    Hepatitis C    s/p treatment with Epclusa   History of HPV infection    History of pneumonia    Pneumonia    Smoker     Surgical History: Past Surgical History:  Procedure Laterality Date   BIOPSY  05/02/2018   Procedure: BIOPSY;  Surgeon: Corbin Ade, MD;  Location: [facility];  Service: Endoscopy;;  gastric   CESAREAN SECTION     CHEST TUBE INSERTION Right 06/26/2019   Procedure: Right CHEST TUBE REPLACEMENT;  Surgeon: Loreli Slot, MD;  Location[facility] OR;  Service: Thoracic;  Laterality: Right;   COLONOSCOPY WITH PROPOFOL N/A 03/03/2019   Procedure: COLONOSCOPY WITH PROPOFOL;  Surgeon: Corbin Ade, MD;  Location: [facility];  Service: Endoscopy;  Laterality: N/A;  9:15am   ESOPHAGOGASTRODUODENOSCOPY (EGD) WITH PROPOFOL N/A 05/02/2018   Procedure: ESOPHAGOGASTRODUODENOSCOPY (EGD) WITH PROPOFOL;  Surgeon: Corbin Ade, MD;  Location: [facility];  Service: Endoscopy;  Laterality: N/A;  8:30am   EYE SURGERY Bilateral    cataract    HEMOSTASIS CLIP PLACEMENT  03/03/2019   Procedure: HEMOSTASIS CLIP PLACEMENT;  Surgeon: Corbin Ade, MD;  Location: [facility];  Service: Endoscopy;;   INTERCOSTAL NERVE BLOCK Right 06/19/2019   Procedure: Intercostal Nerve Block;  Surgeon: Loreli Slot, MD;  Location[facility] OR;  Service: Thoracic;  Laterality: Right;   IR US GUIDE VASC ACCESS RIGHT  03/13/2018   LOBECTOMY     LYMPH NODE DISSECTION Right 06/19/2019   Procedure: Lymph Node Dissection;  Surgeon: Loreli Slot, MD;  Location: [facility] OR;  Service: Thoracic;  Laterality: Right;   POLYPECTOMY  03/03/2019   Procedure: POLYPECTOMY;  Surgeon: Corbin Ade, MD;  [facility]: [facility];  Service: Endoscopy;;   PORTACATH PLACEMENT Left 09/08/2019   Procedure: INSERTION PORT-A-CATH LEFT CHEST (attached catheter in left subclavian);  Surgeon: Franky Macho, MD;  Location: [facility];  Service: General;  Laterality: Left;   TONSILLECTOMY     TOOTH EXTRACTION  01/31/2018   x 7   VIDEO BRONCHOSCOPY WITH INSERTION OF INTERBRONCHIAL VALVE (IBV) N/A 06/26/2019   Procedure: VIDEO BRONCHOSCOPY WITH INSERTION OF TWO INTERBRONCHIAL VALVE (IBV);  Surgeon: Loreli Slot, MD;  Location: [facility] OR;  Service: Thoracic;  Laterality: N/A;   VIDEO BRONCHOSCOPY WITH INSERTION OF INTERBRONCHIAL VALVE (IBV) N/A 08/08/2019   Procedure: VIDEO BRONCHOSCOPY WITH REMOVAL OF INTERBRONCHIAL VALVE (IBV);  Surgeon: Loreli Slot, MD;  Location: [facility] OR;  Service: Thoracic;  Laterality: N/A;    Social History: Social History   Socioeconomic History   Marital status: Single    Spouse name: Not on file   Number of children: Not on file   Years of education: Not on file   Highest education level: Not on file  Occupational History   Not on file  Tobacco Use   Smoking status: Former    Packs/day: 0.50    Years: 38.00    Additional pack years: 0.00    Total pack years: 19.00    Types: Cigarettes    Quit date: 06/19/2019    Years since quitting:  3.0   Smokeless tobacco: Never  Vaping Use   Vaping Use: Never used  Substance and Sexual Activity   Alcohol use: Not Currently  Comment: Last use of alcohol 03/2016   Drug use: Not Currently    Types: 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   Sexual activity: Not Currently  Other Topics Concern   Not on file  Social History Narrative   Not on file   Social Determinants of Health   Financial Resource Strain: Medium Risk (12/10/2019)   Overall Financial Resource Strain (CARDIA)    Difficulty of Paying Living Expenses: Somewhat hard  Food Insecurity: Food Insecurity Present (12/10/2019)   Hunger Vital Sign    Worried About Running Out of Food in the Last Year: Often true    Ran Out of Food in the Last Year: Often true  Transportation Needs: No Transportation Needs (12/10/2019)   PRAPARE - Administrator, Civil Service (Medical): No    Lack of Transportation (Non-Medical): No  Physical Activity: Sufficiently Active (12/10/2019)   Exercise Vital Sign    Days of Exercise per Week: 3 days    Minutes of Exercise per Session: 150+ min  Stress: Stress Concern Present (12/10/2019)   [employer] of Occupational Health - Occupational Stress Questionnaire    Feeling of Stress : Very much  Social Connections: Socially Isolated (12/10/2019)   Social Connection and Isolation Panel [NHANES]    Frequency of Communication with Friends and Family: More than three times a week    Frequency of Social Gatherings with Friends and Family: Once a week    Attends Religious Services: Never    Database administrator or Organizations: No    Attends Banker Meetings: Never    Marital Status: Divorced  Catering manager Violence: Not At Risk (12/10/2019)   Humiliation, Afraid, Rape, and Kick questionnaire    Fear of Current or Ex-Partner: No    Emotionally Abused: No    Physically Abused: No    Sexually Abused: No    Family History: Family History  Problem Relation Age of Onset    Congenital heart disease Mother    Bipolar disorder Mother    Prostate cancer Brother    Alzheimer's disease Paternal Grandmother    Colon cancer Neg Hx     Current Medications:  Current Outpatient Medications:    albuterol (VENTOLIN HFA) 108 (90 Base) MCG/ACT inhaler, Inhale 2 puffs into the lungs every 4 (four) hours as needed for wheezing or shortness of breath., Disp: , Rfl:    chlorhexidine (PERIDEX) 0.12 % solution, SMARTSIG:By Mouth, Disp: , Rfl:    clindamycin (CLEOCIN) 300 MG capsule, Take 300 mg by mouth 3 (three) times daily., Disp: , Rfl:    dexamethasone (DECADRON) 2 MG tablet, Take 1 tablet (2 mg total) by mouth 2 (two) times daily., Disp: 60 tablet, Rfl: 0   diphenhydrAMINE (BENADRYL) 25 MG tablet, Take 25 mg by mouth at bedtime., Disp: , Rfl:    divalproex (DEPAKOTE) 250 MG DR tablet, Take 250 mg by mouth at bedtime. , Disp: , Rfl:    folic acid (FOLVITE) 1 MG tablet, Take 1 tablet (1 mg total) by mouth daily., Disp: 90 tablet, Rfl: 1   ipratropium-albuterol (DUONEB) 0.5-2.5 (3) MG/3ML SOLN, Take 3 mLs by nebulization every 6 (six) hours as needed., Disp: 360 mL, Rfl: 3   lidocaine-prilocaine (EMLA) cream, Apply a small amount to port a cath site and cover with plastic wrap 1 hour prior to chemotherapy appointments, Disp: 30 g, Rfl: 3   magnesium oxide (MAG-OX) 400 (240 Mg) MG tablet, Take 1 tablet (400 mg total) by mouth  daily., Disp: 30 tablet, Rfl: 3   ondansetron (ZOFRAN ODT) 8 MG disintegrating tablet, Place 1 tablet under tongue every 8 hours as needed for nausea. You may begin using this medication on the third day after each chemotherapy treatment., Disp: 20 tablet, Rfl: 1   pantoprazole (PROTONIX) 40 MG tablet, TAKE 1 TABLET BY MOUTH DAILY BEFORE BREAKFAST, Disp: 90 tablet, Rfl: 3   prochlorperazine (COMPAZINE) 10 MG tablet, Take 1 tablet (10 mg total) by mouth every 6 (six) hours as needed for nausea or vomiting., Disp: 60 tablet, Rfl: 5   risperiDONE (RISPERDAL) 2  MG tablet, Take 2 mg by mouth at bedtime. , Disp: , Rfl:    scopolamine (TRANSDERM-SCOP) 1 MG/3DAYS, Place 1 patch (1.5 mg total) onto the skin every 3 (three) days., Disp: 10 patch, Rfl: 12   SYMBICORT 160-4.5 MCG/ACT inhaler, Inhale 2 puffs into the lungs 2 (two) times daily., Disp: 1 each, Rfl: 6   traMADol (ULTRAM) 50 MG tablet, TAKE (2) TABLETS BY MOUTH TWICE DAILY AS NEEDED., Disp: 120 tablet, Rfl: 0   traMADol HCl 100 MG TABS, Take 100 mg by mouth 2 (two) times daily as needed., Disp: 60 tablet, Rfl: 0   traZODone (DESYREL) 100 MG tablet, Take by mouth., Disp: , Rfl:    diphenoxylate-atropine (LOMOTIL) 2.5-0.025 MG tablet, Take 2 tablets by mouth 4 (four) times daily as needed for diarrhea or loose stools., Disp: 240 tablet, Rfl: 0   Allergies: Allergies  Allergen Reactions   Folic Acid Anaphylaxis    Not entirely clear if it is from folic acid. She is taking folic acid now and doesn't have the throat swelling anymore   Penicillins Anaphylaxis    Throat swelled Did it involve swelling of the face/tongue/throat, SOB, or low BP? Yes Did it involve sudden or severe rash/hives, skin peeling, or any reaction on the inside of your mouth or nose? No Did you need to seek medical attention at a hospital or doctor's office? No When did it last happen?      childhood allergy If all above answers are "NO", may proceed with cephalosporin use.    Amoxicillin     hallucinations Did it involve swelling of the face/tongue/throat, SOB, or low BP? No Did it involve sudden or severe rash/hives, skin peeling, or any reaction on the inside of your mouth or nose? No Did you need to seek medical attention at a hospital or doctor's office? Yes When did it last happen?      July 2019 If all above answers are "NO", may proceed with cephalosporin use.    Fish Allergy Nausea Only   Other Swelling    Patient states that the sunrise brand folic acid made her feel like her throat was swelling.     REVIEW OF  SYSTEMS:   Review of Systems  Constitutional:  Negative for chills, fatigue and fever.  HENT:   Negative for lump/mass, mouth sores, nosebleeds, sore throat and trouble swallowing.   Eyes:  Negative for eye problems.  Respiratory:  Positive for cough and shortness of breath.   Cardiovascular:  Negative for chest pain, leg swelling and palpitations.  Gastrointestinal:  Positive for diarrhea, nausea and vomiting. Negative for abdominal pain and constipation.  Genitourinary:  Negative for bladder incontinence, difficulty urinating, dysuria, frequency, hematuria and nocturia.   Musculoskeletal:  Negative for arthralgias, back pain, flank pain, myalgias and neck pain.  Skin:  Negative for itching and rash.  Neurological:  Positive for dizziness. Negative for headaches  and numbness.  Hematological:  Does not bruise/bleed easily.  Psychiatric/Behavioral:  Negative for depression, sleep disturbance and suicidal ideas. The patient is not nervous/anxious.   All other systems reviewed and are negative.    VITALS:   There were no vitals taken for this visit.  Wt Readings from Last 3 Encounters:  07/20/22 150 lb 12.8 oz (68.4 kg)  07/10/22 152 lb 6.4 oz (69.1 kg)  06/29/22 154 lb 12.8 oz (70.2 kg)    There is no height or weight on file to calculate BMI.  Performance status (ECOG): 1 - Symptomatic but completely ambulatory  PHYSICAL EXAM:   Physical Exam Vitals and nursing note reviewed. Exam conducted with a chaperone present.  Constitutional:      Appearance: Normal appearance.  Cardiovascular:     Rate and Rhythm: Normal rate and regular rhythm.     Pulses: Normal pulses.     Heart sounds: Normal heart sounds.  Pulmonary:     Effort: Pulmonary effort is normal.     Breath sounds: Normal breath sounds.  Abdominal:     Palpations: Abdomen is soft. There is no hepatomegaly, splenomegaly or mass.     Tenderness: There is no abdominal tenderness.  Musculoskeletal:     Right lower leg:  No edema.     Left lower leg: No edema.  Lymphadenopathy:     Cervical: No cervical adenopathy.     Right cervical: No superficial, deep or posterior cervical adenopathy.    Left cervical: No superficial, deep or posterior cervical adenopathy.     Upper Body:     Right upper body: No supraclavicular or axillary adenopathy.     Left upper body: No supraclavicular or axillary adenopathy.  Neurological:     General: No focal deficit present.     Mental Status: She is alert and oriented to person, place, and time.  Psychiatric:        Mood and Affect: Mood normal.        Behavior: Behavior normal.     LABS:      Latest Ref Rng & Units 07/20/2022    8:54 AM 07/10/2022    9:27 AM 06/29/2022    8:31 AM  CBC  WBC 4.0 - 10.5 K/uL 7.9  5.7  11.4   Hemoglobin 12.0 - 15.0 g/dL 40.9  81.1  91.4   Hematocrit 36.0 - 46.0 % 41.6  38.1  43.7   Platelets 150 - 400 K/uL 245  166  139       Latest Ref Rng & Units 07/20/2022    8:54 AM 07/10/2022    9:27 AM 06/29/2022    8:31 AM  CMP  Glucose 70 - 99 mg/dL 782  956  213   BUN 6 - 20 mg/dL 8  6  14    Creatinine 0.44 - 1.00 mg/dL 0.86  5.78  4.69   Sodium 135 - 145 mmol/L 135  135  133   Potassium 3.5 - 5.1 mmol/L 3.2  3.9  3.5   Chloride 98 - 111 mmol/L 97  97  95   CO2 22 - 32 mmol/L 28  29  27    Calcium 8.9 - 10.3 mg/dL 9.0  8.7  8.7   Total Protein 6.5 - 8.1 g/dL 6.8  7.0  6.9   Total Bilirubin 0.3 - 1.2 mg/dL 0.6  0.4  0.8   Alkaline Phos 38 - 126 U/L 44  44  48  C  AST 15 - 41 U/L  18  41  19   ALT 0 - 44 U/L 16  35  23     C Corrected result     No results found for: "CEA1", "CEA" / No results found for: "CEA1", "CEA" No results found for: "PSA1" No results found for: "ZOX096" No results found for: "CAN125"  No results found for: "TOTALPROTELP", "ALBUMINELP", "A1GS", "A2GS", "BETS", "BETA2SER", "GAMS", "MSPIKE", "SPEI" Lab Results  Component Value Date   TIBC 362 01/17/2018   FERRITIN 136 01/17/2018   IRONPCTSAT 40 (H)  01/17/2018   Lab Results  Component Value Date   LDH 146 05/01/2019   LDH 168 11/28/2017   LDH 185 08/24/2017     STUDIES:   NM PET Image Restag (PS) Skull Base To Thigh  Result Date: 06/28/2022 CLINICAL DATA:  Subsequent treatment strategy for right lung cancer. EXAM: NUCLEAR MEDICINE PET SKULL BASE TO THIGH TECHNIQUE: 7.3 mCi F-18 FDG was injected intravenously. Full-ring PET imaging was performed from the skull base to thigh after the radiotracer. CT data was obtained and used for attenuation correction and anatomic localization. Fasting blood glucose: 123 mg/dl COMPARISON:  Thoracic spine CT dated 06/05/2022. CT chest dated 05/29/2022. PET-CT dated 01/19/2022. FINDINGS: Mediastinal blood pool activity: SUV max 2.1 Liver activity: SUV max NA NECK: No hypermetabolic cervical lymphadenopathy. Incidental CT findings: None. CHEST: Status post right lower lobectomy. 8 x 18 mm posterior right middle lobe nodule (series 7/image 39), max SUV 3.3, suspicious for metachronous primary bronchogenic carcinoma versus metastasis. Additional 5 mm left lower lobe nodule (series 7/image 65), non FDG avid but beneath the size threshold for PET sensitivity. No hypermetabolic thoracic lymphadenopathy. Left chest port terminates at the cavoatrial junction. Incidental CT findings: Atherosclerotic calcifications of the aortic arch. Mild coronary atherosclerosis of the LAD. ABDOMEN/PELVIS: No abnormal hypermetabolism in the liver, spleen, pancreas, or adrenal glands. No hypermetabolic abdominopelvic lymphadenopathy. Incidental CT findings: Atherosclerotic calcifications of the abdominal aorta and branch vessels. Extensive colonic diverticulosis, without evidence of diverticulitis. SKELETON: Sclerotic lesion at T5 (series 3/image 82), max SUV 2.7, corresponding to the patient's known osseous metastasis. Expansile lytic osseous metastasis involving the left posterolateral 5th rib (series 3/image 86), max SUV 3.4. Incidental  CT findings: None. IMPRESSION: Status post right lower lobectomy. 18 mm posterior right middle lobe nodule, suspicious for metachronous primary bronchogenic carcinoma versus metastasis. Osseous metastases involving the T5 vertebral body and left posterolateral 5th rib. Findings are unchanged from recent CT chest but progressive from prior PET. Electronically Signed   By: Charline Bills M.D.   On: 06/28/2022 00:58

## 2022-07-20 ENCOUNTER — Inpatient Hospital Stay: Payer: Medicaid Other

## 2022-07-20 ENCOUNTER — Inpatient Hospital Stay: Payer: Medicaid Other | Admitting: Dietician

## 2022-07-20 ENCOUNTER — Other Ambulatory Visit: Payer: Self-pay | Admitting: *Deleted

## 2022-07-20 ENCOUNTER — Inpatient Hospital Stay (HOSPITAL_BASED_OUTPATIENT_CLINIC_OR_DEPARTMENT_OTHER): Payer: Medicaid Other | Admitting: Hematology

## 2022-07-20 VITALS — BP 110/49 | HR 94 | Temp 97.3°F | Resp 18

## 2022-07-20 DIAGNOSIS — C3491 Malignant neoplasm of unspecified part of right bronchus or lung: Secondary | ICD-10-CM

## 2022-07-20 DIAGNOSIS — E876 Hypokalemia: Secondary | ICD-10-CM

## 2022-07-20 DIAGNOSIS — Z5112 Encounter for antineoplastic immunotherapy: Secondary | ICD-10-CM | POA: Diagnosis not present

## 2022-07-20 LAB — CBC WITH DIFFERENTIAL/PLATELET
Abs Immature Granulocytes: 0.06 10*3/uL (ref 0.00–0.07)
Basophils Absolute: 0 10*3/uL (ref 0.0–0.1)
Basophils Relative: 1 %
Eosinophils Absolute: 0 10*3/uL (ref 0.0–0.5)
Eosinophils Relative: 0 %
HCT: 41.6 % (ref 36.0–46.0)
Hemoglobin: 13.8 g/dL (ref 12.0–15.0)
Immature Granulocytes: 1 %
Lymphocytes Relative: 26 %
Lymphs Abs: 2 10*3/uL (ref 0.7–4.0)
MCH: 33.3 pg (ref 26.0–34.0)
MCHC: 33.2 g/dL (ref 30.0–36.0)
MCV: 100.2 fL — ABNORMAL HIGH (ref 80.0–100.0)
Monocytes Absolute: 0.7 10*3/uL (ref 0.1–1.0)
Monocytes Relative: 9 %
Neutro Abs: 5.1 10*3/uL (ref 1.7–7.7)
Neutrophils Relative %: 63 %
Platelets: 245 10*3/uL (ref 150–400)
RBC: 4.15 MIL/uL (ref 3.87–5.11)
RDW: 14.1 % (ref 11.5–15.5)
WBC: 7.9 10*3/uL (ref 4.0–10.5)
nRBC: 0 % (ref 0.0–0.2)

## 2022-07-20 LAB — COMPREHENSIVE METABOLIC PANEL
ALT: 16 U/L (ref 0–44)
AST: 18 U/L (ref 15–41)
Albumin: 3.3 g/dL — ABNORMAL LOW (ref 3.5–5.0)
Alkaline Phosphatase: 44 U/L (ref 38–126)
Anion gap: 10 (ref 5–15)
BUN: 8 mg/dL (ref 6–20)
CO2: 28 mmol/L (ref 22–32)
Calcium: 9 mg/dL (ref 8.9–10.3)
Chloride: 97 mmol/L — ABNORMAL LOW (ref 98–111)
Creatinine, Ser: 0.55 mg/dL (ref 0.44–1.00)
GFR, Estimated: >60 mL/min (ref >60)
Glucose, Bld: 104 mg/dL — ABNORMAL HIGH (ref 70–99)
Potassium: 3.2 mmol/L — ABNORMAL LOW (ref 3.5–5.1)
Sodium: 135 mmol/L (ref 135–145)
Total Bilirubin: 0.6 mg/dL (ref 0.3–1.2)
Total Protein: 6.8 g/dL (ref 6.5–8.1)

## 2022-07-20 LAB — MAGNESIUM: Magnesium: 1.8 mg/dL (ref 1.7–2.4)

## 2022-07-20 MED ORDER — PALONOSETRON HCL INJECTION 0.25 MG/5ML
0.2500 mg | Freq: Once | INTRAVENOUS | Status: AC
  Administered 2022-07-20: 0.25 mg via INTRAVENOUS
  Filled 2022-07-20: qty 5

## 2022-07-20 MED ORDER — POTASSIUM CHLORIDE CRYS ER 20 MEQ PO TBCR
40.0000 meq | EXTENDED_RELEASE_TABLET | Freq: Once | ORAL | Status: AC
  Administered 2022-07-20: 40 meq via ORAL
  Filled 2022-07-20: qty 2

## 2022-07-20 MED ORDER — SODIUM CHLORIDE 0.9 % IV SOLN
544.0000 mg | Freq: Once | INTRAVENOUS | Status: AC
  Administered 2022-07-20: 540 mg via INTRAVENOUS
  Filled 2022-07-20: qty 54

## 2022-07-20 MED ORDER — SODIUM CHLORIDE 0.9% FLUSH
10.0000 mL | Freq: Once | INTRAVENOUS | Status: AC
  Administered 2022-07-20: 10 mL via INTRAVENOUS

## 2022-07-20 MED ORDER — HEPARIN SOD (PORK) LOCK FLUSH 100 UNIT/ML IV SOLN
500.0000 [IU] | Freq: Once | INTRAVENOUS | Status: AC | PRN
  Administered 2022-07-20: 500 [IU]

## 2022-07-20 MED ORDER — DIPHENOXYLATE-ATROPINE 2.5-0.025 MG PO TABS: 2.0000 | ORAL_TABLET | Freq: Four times a day (QID) | ORAL | 0 refills | Status: DC | PRN

## 2022-07-20 MED ORDER — SODIUM CHLORIDE 0.9 % IV SOLN
10.0000 mg | Freq: Once | INTRAVENOUS | Status: AC
  Administered 2022-07-20: 10 mg via INTRAVENOUS
  Filled 2022-07-20: qty 10

## 2022-07-20 MED ORDER — SODIUM CHLORIDE 0.9 % IV SOLN
150.0000 mg | Freq: Once | INTRAVENOUS | Status: AC
  Administered 2022-07-20: 150 mg via INTRAVENOUS
  Filled 2022-07-20: qty 150

## 2022-07-20 MED ORDER — SODIUM CHLORIDE 0.9 % IV SOLN
200.0000 mg | Freq: Once | INTRAVENOUS | Status: AC
  Administered 2022-07-20: 200 mg via INTRAVENOUS
  Filled 2022-07-20: qty 8

## 2022-07-20 MED ORDER — PROCHLORPERAZINE MALEATE 10 MG PO TABS: 10.0000 mg | ORAL_TABLET | Freq: Four times a day (QID) | ORAL | 5 refills | Status: DC | PRN

## 2022-07-20 MED ORDER — SODIUM CHLORIDE 0.9 % IV SOLN
400.0000 mg/m2 | Freq: Once | INTRAVENOUS | Status: AC
  Administered 2022-07-20: 700 mg via INTRAVENOUS
  Filled 2022-07-20: qty 20

## 2022-07-20 MED ORDER — SODIUM CHLORIDE 0.9% FLUSH
10.0000 mL | INTRAVENOUS | Status: DC | PRN
  Administered 2022-07-20: 10 mL

## 2022-07-20 MED ORDER — SODIUM CHLORIDE 0.9 % IV SOLN: Freq: Once | INTRAVENOUS | Status: AC

## 2022-07-20 NOTE — Progress Notes (Signed)
MD using creatinine 0.6 in calculation of carboplatin dose - continue current dose 540 mg  Pryor Ochoa, PharmD

## 2022-07-20 NOTE — Patient Instructions (Signed)
[facility]  Discharge Instructions: Thank you for choosing [facility] to provide your oncology and hematology care.  If you have a lab appointment with the Cancer Center - please note that after April 8th, 2024, all labs will be drawn in the cancer center.  You do not have to check in or register with the main entrance as you have in the past but will complete your check-in in the cancer center.  Wear comfortable clothing and clothing appropriate for easy access to any Portacath or PICC line.   We strive to give you quality time with your provider. You may need to reschedule your appointment if you arrive late (15 or more minutes).  Arriving late affects you and other patients whose appointments are after yours.  Also, if you miss three or more appointments without notifying the office, you may be dismissed from the clinic at the provider's discretion.      For prescription refill requests, have your pharmacy contact our office and allow 72 hours for refills to be completed.    Today you received the following chemotherapy and/or immunotherapy agents Keytruda, Alimta, Carboplatin.   Carboplatin Injection What is this medication? CARBOPLATIN (KAR boe pla tin) treats some types of cancer. It works by slowing down the growth of cancer cells. This medicine may be used for other purposes; ask your health care provider or pharmacist if you have questions. COMMON BRAND NAME(S): Paraplatin What should I tell my care team before I take this medication? They need to know if you have any of these conditions: Blood disorders Hearing problems Kidney disease Recent or ongoing radiation therapy An unusual or allergic reaction to carboplatin, cisplatin, other medications, foods, dyes, or preservatives Pregnant or trying to get pregnant Breast-feeding How should I use this medication? This medication is injected into a vein. It is given by your care team in a hospital  or clinic setting. Talk to your care team about the use of this medication in children. Special care may be needed. Overdosage: If you think you have taken too much of this medicine contact a poison control center or emergency room at once. NOTE: This medicine is only for you. Do not share this medicine with others. What if I miss a dose? Keep appointments for follow-up doses. It is important not to miss your dose. Call your care team if you are unable to keep an appointment. What may interact with this medication? Medications for seizures Some antibiotics, such as amikacin, gentamicin, neomycin, streptomycin, tobramycin Vaccines This list may not describe all possible interactions. Give your health care provider a list of all the medicines, herbs, non-prescription drugs, or dietary supplements you use. Also tell them if you smoke, drink alcohol, or use illegal drugs. Some items may interact with your medicine. What should I watch for while using this medication? Your condition will be monitored carefully while you are receiving this medication. You may need blood work while taking this medication. This medication may make you feel generally unwell. This is not uncommon, as chemotherapy can affect healthy cells as well as cancer cells. Report any side effects. Continue your course of treatment even though you feel ill unless your care team tells you to stop. In some cases, you may be given additional medications to help with side effects. Follow all directions for their use. This medication may increase your risk of getting an infection. Call your care team for advice if you get a fever, chills, sore  throat, or other symptoms of a cold or flu. Do not treat yourself. Try to avoid being around people who are sick. Avoid taking medications that contain aspirin, acetaminophen, ibuprofen, naproxen, or ketoprofen unless instructed by your care team. These medications may hide a fever. Be careful brushing  or flossing your teeth or using a toothpick because you may get an infection or bleed more easily. If you have any dental work done, tell your dentist you are receiving this medication. Talk to your care team if you wish to become pregnant or think you might be pregnant. This medication can cause serious birth defects. Talk to your care team about effective forms of contraception. Do not breast-feed while taking this medication. What side effects may I notice from receiving this medication? Side effects that you should report to your care team as soon as possible: Allergic reactions--skin rash, itching, hives, swelling of the face, lips, tongue, or throat Infection--fever, chills, cough, sore throat, wounds that don't heal, pain or trouble when passing urine, general feeling of discomfort or being unwell Low red blood cell level--unusual weakness or fatigue, dizziness, headache, trouble breathing Pain, tingling, or numbness in the hands or feet, muscle weakness, change in vision, confusion or trouble speaking, loss of balance or coordination, trouble walking, seizures Unusual bruising or bleeding Side effects that usually do not require medical attention (report to your care team if they continue or are bothersome): Hair loss Nausea Unusual weakness or fatigue Vomiting This list may not describe all possible side effects. Call your doctor for medical advice about side effects. You may report side effects to FDA at 1-800-FDA-1088. Where should I keep my medication? This medication is given in a hospital or clinic. It will not be stored at home. NOTE: This sheet is a summary. It may not cover all possible information. If you have questions about this medicine, talk to your doctor, pharmacist, or health care provider.  2024 Elsevier/Gold Standard (2021-05-10 00:00:00) Pemetrexed Injection What is this medication? PEMETREXED (PEM e TREX ed) treats some types of cancer. It works by slowing down the  growth of cancer cells. This medicine may be used for other purposes; ask your health care provider or pharmacist if you have questions. COMMON BRAND NAME(S): Alimta, PEMFEXY What should I tell my care team before I take this medication? They need to know if you have any of these conditions: Infection, such as chickenpox, cold sores, or herpes Kidney disease Low blood cell levels (white cells, red cells, and platelets) Lung or breathing disease, such as asthma Radiation therapy An unusual or allergic reaction to pemetrexed, other medications, foods, dyes, or preservatives If you or your partner are pregnant or trying to get pregnant Breast-feeding How should I use this medication? This medication is injected into a vein. It is given by your care team in a hospital or clinic setting. Talk to your care team about the use of this medication in children. Special care may be needed. Overdosage: If you think you have taken too much of this medicine contact a poison control center or emergency room at once. NOTE: This medicine is only for you. Do not share this medicine with others. What if I miss a dose? Keep appointments for follow-up doses. It is important not to miss your dose. Call your care team if you are unable to keep an appointment. What may interact with this medication? Do not take this medication with any of the following: Live virus vaccines This medication may also interact  with the following: Ibuprofen This list may not describe all possible interactions. Give your health care provider a list of all the medicines, herbs, non-prescription drugs, or dietary supplements you use. Also tell them if you smoke, drink alcohol, or use illegal drugs. Some items may interact with your medicine. What should I watch for while using this medication? Your condition will be monitored carefully while you are receiving this medication. This medication may make you feel generally unwell. This is  not uncommon as chemotherapy can affect healthy cells as well as cancer cells. Report any side effects. Continue your course of treatment even though you feel ill unless your care team tells you to stop. This medication can cause serious side effects. To reduce the risk, your care team may give you other medications to take before receiving this one. Be sure to follow the directions from your care team. This medication can cause a rash or redness in areas of the body that have previously had radiation therapy. If you have had radiation therapy, tell your care team if you notice a rash in this area. This medication may increase your risk of getting an infection. Call your care team for advice if you get a fever, chills, sore throat, or other symptoms of a cold or flu. Do not treat yourself. Try to avoid being around people who are sick. Be careful brushing or flossing your teeth or using a toothpick because you may get an infection or bleed more easily. If you have any dental work done, tell your dentist you are receiving this medication. Avoid taking medications that contain aspirin, acetaminophen, ibuprofen, naproxen, or ketoprofen unless instructed by your care team. These medications may hide a fever. Check with your care team if you have severe diarrhea, nausea, and vomiting, or if you sweat a lot. The loss of too much body fluid may make it dangerous for you to take this medication. Talk to your care team if you or your partner wish to become pregnant or think either of you might be pregnant. This medication can cause serious birth defects if taken during pregnancy and for 6 months after the last dose. A negative pregnancy test is required before starting this medication. A reliable form of contraception is recommended while taking this medication and for 6 months after the last dose. Talk to your care team about reliable forms of contraception. Do not father a child while taking this medication and for  3 months after the last dose. Use a condom while having sex during this time period. Do not breastfeed while taking this medication and for 1 week after the last dose. This medication may cause infertility. Talk to your care team if you are concerned about your fertility. What side effects may I notice from receiving this medication? Side effects that you should report to your care team as soon as possible: Allergic reactions--skin rash, itching, hives, swelling of the face, lips, tongue, or throat Dry cough, shortness of breath or trouble breathing Infection--fever, chills, cough, sore throat, wounds that don't heal, pain or trouble when passing urine, general feeling of discomfort or being unwell Kidney injury--decrease in the amount of urine, swelling of the ankles, hands, or feet Low red blood cell level--unusual weakness or fatigue, dizziness, headache, trouble breathing Redness, blistering, peeling, or loosening of the skin, including inside the mouth Unusual bruising or bleeding Side effects that usually do not require medical attention (report to your care team if they continue or are bothersome): Fatigue Loss  of appetite Nausea Vomiting This list may not describe all possible side effects. Call your doctor for medical advice about side effects. You may report side effects to FDA at 1-800-FDA-1088. Where should I keep my medication? This medication is given in a hospital or clinic. It will not be stored at home. NOTE: This sheet is a summary. It may not cover all possible information. If you have questions about this medicine, talk to your doctor, pharmacist, or health care provider.  2024 Elsevier/Gold Standard (2021-05-24 00:00:00) Pembrolizumab Injection What is this medication? PEMBROLIZUMAB (PEM broe LIZ ue mab) treats some types of cancer. It works by helping your immune system slow or stop the spread of cancer cells. It is a monoclonal antibody. This medicine may be used for  other purposes; ask your health care provider or pharmacist if you have questions. COMMON BRAND NAME(S): Keytruda What should I tell my care team before I take this medication? They need to know if you have any of these conditions: Allogeneic stem cell transplant (uses someone else's stem cells) Autoimmune diseases, such as Crohn disease, ulcerative colitis, lupus History of chest radiation Nervous system problems, such as Guillain-Barre syndrome, myasthenia gravis Organ transplant An unusual or allergic reaction to pembrolizumab, other medications, foods, dyes, or preservatives Pregnant or trying to get pregnant Breast-feeding How should I use this medication? This medication is injected into a vein. It is given by your care team in a hospital or clinic setting. A special MedGuide will be given to you before each treatment. Be sure to read this information carefully each time. Talk to your care team about the use of this medication in children. While it may be prescribed for children as young as 6 months for selected conditions, precautions do apply. Overdosage: If you think you have taken too much of this medicine contact a poison control center or emergency room at once. NOTE: This medicine is only for you. Do not share this medicine with others. What if I miss a dose? Keep appointments for follow-up doses. It is important not to miss your dose. Call your care team if you are unable to keep an appointment. What may interact with this medication? Interactions have not been studied. This list may not describe all possible interactions. Give your health care provider a list of all the medicines, herbs, non-prescription drugs, or dietary supplements you use. Also tell them if you smoke, drink alcohol, or use illegal drugs. Some items may interact with your medicine. What should I watch for while using this medication? Your condition will be monitored carefully while you are receiving this  medication. You may need blood work while taking this medication. This medication may cause serious skin reactions. They can happen weeks to months after starting the medication. Contact your care team right away if you notice fevers or flu-like symptoms with a rash. The rash may be red or purple and then turn into blisters or peeling of the skin. You may also notice a red rash with swelling of the face, lips, or lymph nodes in your neck or under your arms. Tell your care team right away if you have any change in your eyesight. Talk to your care team if you may be pregnant. Serious birth defects can occur if you take this medication during pregnancy and for 4 months after the last dose. You will need a negative pregnancy test before starting this medication. Contraception is recommended while taking this medication and for 4 months after the last dose.  Your care team can help you find the option that works for you. Do not breastfeed while taking this medication and for 4 months after the last dose. What side effects may I notice from receiving this medication? Side effects that you should report to your care team as soon as possible: Allergic reactions--skin rash, itching, hives, swelling of the face, lips, tongue, or throat Dry cough, shortness of breath or trouble breathing Eye pain, redness, irritation, or discharge with blurry or decreased vision Heart muscle inflammation--unusual weakness or fatigue, shortness of breath, chest pain, fast or irregular heartbeat, dizziness, swelling of the ankles, feet, or hands Hormone gland problems--headache, sensitivity to light, unusual weakness or fatigue, dizziness, fast or irregular heartbeat, increased sensitivity to cold or heat, excessive sweating, constipation, hair loss, increased thirst or amount of urine, tremors or shaking, irritability Infusion reactions--chest pain, shortness of breath or trouble breathing, feeling faint or lightheaded Kidney injury  (glomerulonephritis)--decrease in the amount of urine, red or dark brown urine, foamy or bubbly urine, swelling of the ankles, hands, or feet Liver injury--right upper belly pain, loss of appetite, nausea, light-colored stool, dark yellow or brown urine, yellowing skin or eyes, unusual weakness or fatigue Pain, tingling, or numbness in the hands or feet, muscle weakness, change in vision, confusion or trouble speaking, loss of balance or coordination, trouble walking, seizures Rash, fever, and swollen lymph nodes Redness, blistering, peeling, or loosening of the skin, including inside the mouth Sudden or severe stomach pain, bloody diarrhea, fever, nausea, vomiting Side effects that usually do not require medical attention (report to your care team if they continue or are bothersome): Bone, joint, or muscle pain Diarrhea Fatigue Loss of appetite Nausea Skin rash This list may not describe all possible side effects. Call your doctor for medical advice about side effects. You may report side effects to FDA at 1-800-FDA-1088. Where should I keep my medication? This medication is given in a hospital or clinic. It will not be stored at home. NOTE: This sheet is a summary. It may not cover all possible information. If you have questions about this medicine, talk to your doctor, pharmacist, or health care provider.  2024 Elsevier/Gold Standard (2021-05-31 00:00:00)       To help prevent nausea and vomiting after your treatment, we encourage you to take your nausea medication as directed.  BELOW ARE SYMPTOMS THAT SHOULD BE REPORTED IMMEDIATELY: *FEVER GREATER THAN 100.4 F (38 C) OR HIGHER *CHILLS OR SWEATING *NAUSEA AND VOMITING THAT IS NOT CONTROLLED WITH YOUR NAUSEA MEDICATION *UNUSUAL SHORTNESS OF BREATH *UNUSUAL BRUISING OR BLEEDING *URINARY PROBLEMS (pain or burning when urinating, or frequent urination) *BOWEL PROBLEMS (unusual diarrhea, constipation, pain near the anus) TENDERNESS IN  MOUTH AND THROAT WITH OR WITHOUT PRESENCE OF ULCERS (sore throat, sores in mouth, or a toothache) UNUSUAL RASH, SWELLING OR PAIN  UNUSUAL VAGINAL DISCHARGE OR ITCHING   Items with * indicate a potential emergency and should be followed up as soon as possible or go to the Emergency Department if any problems should occur.  Please show the CHEMOTHERAPY ALERT CARD or IMMUNOTHERAPY ALERT CARD at check-in to the Emergency Department and triage nurse.  Should you have questions after your visit or need to cancel or reschedule your appointment, please contact [facility] [phone]  and follow the prompts.  Office hours are 8:00 a.m. to 4:30 p.m. Monday - Friday. Please note that voicemails left after 4:00 p.m. may not be returned until the following business day.  We  are closed weekends and major holidays. You have access to a nurse at all times for urgent questions. Please call the main number to the clinic [phone] and follow the prompts.  For any non-urgent questions, you may also contact your provider using MyChart. We now offer e-Visits for anyone 8 and older to request care online for non-urgent symptoms. For details visit [email or web address].   Also download the MyChart app! Go to the app store, search "MyChart", open the app, select [place], and log in with your MyChart username and password.

## 2022-07-20 NOTE — Patient Instructions (Signed)
[facility] at [facility] Discharge Instructions   You were seen and examined today by Dr. Katragadda.  He reviewed the results of your lab work which are normal/stable.   We will proceed with your treatment today.  Return as scheduled.    Thank you for choosing [facility] at [facility] to provide your oncology and hematology care.  To afford each patient quality time with our provider, please arrive at least 15 minutes before your scheduled appointment time.   If you have a lab appointment with the Cancer Center please come in thru the Main Entrance and check in at the main information desk.  You need to re-schedule your appointment should you arrive 10 or more minutes late.  We strive to give you quality time with our providers, and arriving late affects you and other patients whose appointments are after yours.  Also, if you no show three or more times for appointments you may be dismissed from the clinic at the providers discretion.     Again, thank you for choosing [facility].  Our hope is that these requests will decrease the amount of time that you wait before being seen by our physicians.       _____________________________________________________________  Should you have questions after your visit to [facility], please contact our office at [phone] and follow the prompts.  Our office hours are 8:00 a.m. and 4:30 p.m. Monday - Friday.  Please note that voicemails left after 4:00 p.m. may not be returned until the following business day.  We are closed weekends and major holidays.  You do have access to a nurse 24-7, just call the main number to the clinic [phone] and do not press any options, hold on the line and a nurse will answer the phone.    For prescription refill requests, have your pharmacy contact our office and allow 72 hours.    Due to Covid, you will need to wear a mask upon entering  the hospital. If you do not have a mask, a mask will be given to you at the Main Entrance upon arrival. For doctor visits, patients may have 1 support person age 18 or older with them. For treatment visits, patients can not have anyone with them due to social distancing guidelines and our immunocompromised population.      

## 2022-07-20 NOTE — Progress Notes (Signed)
Patients port flushed without difficulty.  Good blood return noted with no bruising or swelling noted at site.  Stable during access and blood draw.  Patient to remain accessed for treatment. 

## 2022-07-20 NOTE — Progress Notes (Signed)
Patient presents today for chemotherapy infusion of Keytruda, Alimta, and Carboplatin. Patient is in satisfactory condition with no new complaints voiced.  Vital signs are stable.  Labs reviewed by Dr. Ellin Saba during the office visit and all labs are within treatment parameters. Potassium 3.2. PO potassium given per MD order. We will proceed with treatment per MD orders.   Patient tolerated treatment well with no complaints voiced.  Patient left ambulatory in stable condition.  Vital signs stable at discharge.  Follow up as scheduled.

## 2022-07-20 NOTE — Progress Notes (Signed)
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 She is currently receiving carboplatin + pemetrexed + pembrolizumab (start 5/30)  Met with patient in infusion. She reports first chemotherapy was tough on her. Patient reports fatigue, nausea, and multiple episodes of diarrhea. She received reduced dose today. Patient is hopeful to have less side effects. Patient has not been drinking Ensure recently as these are too costly.    Medications: mag-ox, protonix, compazine, tramadol, folic acid  Labs: K 3.2, glucose 104, albumin 3.3  Anthropometrics: Wt 150 lb 12.8 oz today   6/10 - 152 lb 6.4 oz  5/30 - 154 lb 12.8 oz   NUTRITION DIAGNOSIS: Unintended weight loss continues    INTERVENTION:  Educated on strategies for diarrhea, encouraged foods with pectin (applesauce, bananas, potatoes) Continue drinking 2 Ensure Plus HP/equivalent for added calories/protein Patient provided with one complimentary case of ensure due to one or more of the following reasons:  malnutrition, weight loss/maintenance, poor appetite AND food insecurity or financial hardship   MONITORING, EVALUATION, GOAL: weight trends, intake   NEXT VISIT: To be scheduled as needed

## 2022-07-20 NOTE — Progress Notes (Signed)
Patient has been examined by Dr. Katragadda. Vital signs and labs have been reviewed by MD - ANC, Creatinine, LFTs, hemoglobin, and platelets are within treatment parameters per M.D. - pt may proceed with treatment.  Primary RN and pharmacy notified.  

## 2022-07-25 ENCOUNTER — Other Ambulatory Visit: Payer: Self-pay

## 2022-08-08 ENCOUNTER — Other Ambulatory Visit: Payer: Self-pay

## 2022-08-09 NOTE — Progress Notes (Signed)
[facility] [address] [place] [address]    Clinic Day:  08/10/2022  Referring physician: Doreatha Massed, MD  Patient Care Team: Benita Stabile, MD as PCP - General (Internal Medicine) Jena Gauss Gerrit Friends, MD as Consulting Physician (Gastroenterology) Doreatha Massed, MD as Consulting Physician (Medical Oncology)   ASSESSMENT & PLAN:   Assessment: 1.  Metastatic lung adenocarcinoma: -PET scan on 05/13/2019 showed right lower lobe nodule concerning for bronchogenic carcinoma.  Cirrhotic liver. -Right lower lobectomy and lymph node excision on 06/19/2019. -Pathology showed 1.7 cm right lower lobe adenocarcinoma, unifocal, lymphovascular invasion present.  Margins negative.  2/14 lymph nodes positive (level 7 and 10R).  PT1CPN2. -PD-L1 30%, K-ras G12A, MSI stable.  EGFR mutation not identified. -4 cycles of adjuvant carboplatin and pemetrexed from 09/09/2019 through 11/11/2019. - CT chest on 12/29/2021: Lytic metastatic lesion in the posterior left fifth rib.  Interval enlargement of cavitary nodule of the dependent right upper lobe.  Numerous new clustered nodules of varying sizes. - PET scan (01/19/2022): Cavitary nodule of concern in the right middle lobe measures 1.3 x 0.7 cm, SUV max of 1.8.  No other hypermetabolic or suspicious lung nodules.  Expansile lytic metastasis involving left fifth rib hypermetabolic measuring 2.6 x 1.9 cm with SUV 4.2. - Left rib biopsy (02/06/2022): Metastatic moderately to poorly differentiated adenocarcinoma - NGS by [facility]: PD-L1 (22 C3)-TPS 5%.  TMB-high.  K-ras G12 A pathogenic variant.  T p53 pathogenic variant.  Other targetable mutations negative.  MSI-stable. - XRT to the left rib lesion completed - SBRT to the T5 lesion from 06/12/2022 through 06/16/2022 - Cycle 1 of carboplatin, pemetrexed and pembrolizumab on 06/29/2022    Plan: 1.  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 - She reported decreased energy.  She also had diarrhea up to 4-5 times per day which improved.  She had nausea on and off without vomiting. - We will send a prescription for Lomotil to be taken as needed for diarrhea. - Reviewed labs today: Normal LFTs and creatinine.  CBC grossly normal. - Will proceed with cycle 2 today with pembrolizumab decreased by 20% and carboplatin AUC 4.  RTC 3 weeks for follow-up.   2.  Left mid back pain: - She has completed XRT.  She is tapering off of dexamethasone 2 mg every other day. - Continue tramadol 50 mg as needed.   3.  Hypomagnesemia: - Continue magnesium 1 tablet daily.  Magnesium is normal today.    No orders of the defined types were placed in this encounter.      [name],acting as a Neurosurgeon for Doreatha Massed, MD.,have documented all relevant documentation on the behalf of Doreatha Massed, MD,as directed by  Doreatha Massed, MD while in the presence of Doreatha Massed, MD.  ***   [place] R Teague   7/10/20249:26 PM  CHIEF COMPLAINT:   Diagnosis: metastatic right lung cancer    Cancer Staging  Non-small cell cancer of right lung [facility]) Staging form: Lung, AJCC 8th Edition - Clinical stage from 02/13/2022: Stage IV (cT1c, cN2, pM1) - Signed by Doreatha Massed, MD on 02/13/2022    Prior Therapy: 1. Right lower lobectomy on 06/19/2019. 2. Carboplatin and pemetrexed x 4 cycles from 09/09/2019 to 11/11/2019 3. SBRT to left rib/chest wall lesion, completed 03/22/22 4. SBRT to T5 lesion, 06/12/22 - 06/16/22  Current Therapy:  carboplatin, pemetrexed and pembrolizumab  HISTORY OF PRESENT ILLNESS:   Oncology History  Adenocarcinoma of lung, stage 3, right (HCC)  07/02/2019 Initial Diagnosis   Adenocarcinoma of lung, stage 3, right (HCC)   07/18/2019 Genetic Testing   [facility]     07/23/2019 Genetic Testing   PD-L1     09/09/2019 - 11/11/2019 Chemotherapy    Patient is on Treatment Plan : LUNG NSCLC Pemetrexed (Alimta) / Carboplatin q21d x 4 cycles     Non-small cell cancer of right lung (HCC)  02/13/2022 Initial Diagnosis   Non-small cell cancer of right lung (HCC)   02/13/2022 Cancer Staging   Staging form: Lung, AJCC 8th Edition - Clinical stage from 02/13/2022: Stage IV (cT1c, cN2, pM1) - Signed by Doreatha Massed, MD on 02/13/2022 Histopathologic type: Adenocarcinoma, NOS Stage prefix: Initial diagnosis Histologic grade (G): G3 Histologic grading system: 4 grade system   06/29/2022 -  Chemotherapy   Patient is on Treatment Plan : LUNG Carboplatin (5) + Pemetrexed (500) + Pembrolizumab (200) D1 q21d Induction x 4 cycles / Maintenance Pemetrexed (500) + Pembrolizumab (200) D1 q21d        INTERVAL HISTORY:   Rachel Gross is a 57 y.o. female presenting to clinic today for follow up of metastatic right lung cancer. She was last seen by me on 07/20/22.  Today, she states that she is doing well overall. Her appetite level is at ***%. Her energy level is at ***%.  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    Bipolar disorder (HCC)    Cancer (HCC)    right lung   COPD (chronic obstructive pulmonary disease) (HCC)    Depression    Dyspnea    Emphysema lung (HCC)    GERD (gastroesophageal reflux disease)    Hepatitis C    s/p treatment with Epclusa   History of HPV infection    History of pneumonia    Pneumonia    Smoker     Surgical History: Past Surgical History:  Procedure Laterality Date   BIOPSY  05/02/2018   Procedure: BIOPSY;  Surgeon: Corbin Ade, MD;  [facility];  Service: Endoscopy;;  gastric   CESAREAN SECTION     CHEST TUBE INSERTION Right 06/26/2019   Procedure: Right CHEST TUBE REPLACEMENT;  Surgeon: Loreli Slot, MD;  Location: [facility] OR;  Service: Thoracic;  Laterality: Right;   COLONOSCOPY WITH PROPOFOL N/A 03/03/2019   Procedure: COLONOSCOPY WITH PROPOFOL;  Surgeon: Corbin Ade, MD;  Location: [facility];  Service: Endoscopy;  Laterality: N/A;  9:15am   ESOPHAGOGASTRODUODENOSCOPY (EGD) WITH PROPOFOL N/A 05/02/2018   Procedure: ESOPHAGOGASTRODUODENOSCOPY (EGD) WITH PROPOFOL;  Surgeon: Corbin Ade, MD;  [facility]: [facility];  Service: Endoscopy;  Laterality: N/A;  8:30am   EYE SURGERY Bilateral    cataract   HEMOSTASIS CLIP PLACEMENT  03/03/2019   Procedure: HEMOSTASIS CLIP PLACEMENT;  Surgeon: Corbin Ade, MD;  [facility]: [facility];  Service: Endoscopy;;   INTERCOSTAL NERVE BLOCK Right 06/19/2019   Procedure: Intercostal Nerve Block;  Surgeon: Loreli Slot, MD;  Location: [facility] OR;  Service: Thoracic;  Laterality: Right;   IR US GUIDE VASC ACCESS RIGHT  03/13/2018   LOBECTOMY     LYMPH NODE DISSECTION Right 06/19/2019   Procedure: Lymph Node Dissection;  Surgeon: Loreli Slot, MD;  Location: [facility] OR;  Service: Thoracic;  Laterality: Right;   POLYPECTOMY  03/03/2019   Procedure: POLYPECTOMY;  Surgeon: Corbin Ade, MD;  [facility]: [facility];  Service: Endoscopy;;   PORTACATH PLACEMENT Left 09/08/2019   Procedure: INSERTION PORT-A-CATH LEFT CHEST (attached catheter in left subclavian);  Surgeon: Franky Macho, MD;  Location: [facility];  Service: General;  Laterality: Left;   TONSILLECTOMY     TOOTH EXTRACTION  01/31/2018   x 7   VIDEO BRONCHOSCOPY WITH INSERTION OF INTERBRONCHIAL VALVE (IBV) N/A 06/26/2019   Procedure: VIDEO BRONCHOSCOPY WITH INSERTION OF TWO INTERBRONCHIAL VALVE (IBV);  Surgeon: Loreli Slot, MD;  Location: [facility] OR;  Service: Thoracic;  Laterality: N/A;   VIDEO BRONCHOSCOPY WITH INSERTION OF INTERBRONCHIAL VALVE (IBV) N/A 08/08/2019   Procedure: VIDEO BRONCHOSCOPY WITH REMOVAL OF INTERBRONCHIAL VALVE (IBV);  Surgeon: Loreli Slot, MD;  Location:  [facility] OR;  Service: Thoracic;  Laterality: N/A;    Social History: Social History   Socioeconomic History   Marital status: Single    Spouse name: Not on file   Number of children: Not on file   Years of education: Not on file   Highest education level: Not on file  Occupational History   Not on file  Tobacco Use   Smoking status: Former    Packs/day: 0.50    Years: 38.00    Additional pack years: 0.00    Total pack years: 19.00    Types: Cigarettes    Quit date: 06/19/2019    Years since quitting: 3.1   Smokeless tobacco: Never  Vaping Use   Vaping Use: Never used  Substance and Sexual Activity   Alcohol use: Not Currently    Comment: Last use of alcohol 03/2016   Drug use: Not Currently    Types: 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   Sexual activity: Not Currently  Other Topics Concern   Not on file  Social History Narrative   Not on file   Social Determinants of Health   Financial Resource Strain: Medium Risk (12/10/2019)   Overall Financial Resource Strain (CARDIA)    Difficulty of Paying Living Expenses: Somewhat hard  Food Insecurity: Food Insecurity Present (12/10/2019)   Hunger Vital Sign    Worried About Running Out of Food in the Last Year: Often true    Ran Out of Food in the Last Year: Often true  Transportation Needs: No Transportation Needs (12/10/2019)   PRAPARE - Administrator, Civil Service (Medical): No    Lack of Transportation (Non-Medical): No  Physical Activity: Sufficiently Active (12/10/2019)   Exercise Vital Sign    Days of Exercise per Week: 3 days    Minutes of Exercise per Session: 150+ min  Stress: Stress Concern Present (12/10/2019)   [employer] of Occupational Health - Occupational Stress Questionnaire    Feeling of Stress : Very much  Social Connections: Socially Isolated (12/10/2019)   Social Connection and Isolation Panel [NHANES]    Frequency of Communication with Friends and Family: More than three times a week     Frequency of Social Gatherings with Friends and Family: Once a week    Attends Religious Services: Never    Database administrator or Organizations: No    Attends Banker Meetings: Never    Marital Status: Divorced  Catering manager Violence: Not At Risk (12/10/2019)   Humiliation, Afraid, Rape, and Kick questionnaire    Fear  of Current or Ex-Partner: No    Emotionally Abused: No    Physically Abused: No    Sexually Abused: No    Family History: Family History  Problem Relation Age of Onset   Congenital heart disease Mother    Bipolar disorder Mother    Prostate cancer Brother    Alzheimer's disease Paternal Grandmother    Colon cancer Neg Hx     Current Medications:  Current Outpatient Medications:    albuterol (VENTOLIN HFA) 108 (90 Base) MCG/ACT inhaler, Inhale 2 puffs into the lungs every 4 (four) hours as needed for wheezing or shortness of breath., Disp: , Rfl:    chlorhexidine (PERIDEX) 0.12 % solution, SMARTSIG:By Mouth, Disp: , Rfl:    clindamycin (CLEOCIN) 300 MG capsule, Take 300 mg by mouth 3 (three) times daily., Disp: , Rfl:    dexamethasone (DECADRON) 2 MG tablet, Take 1 tablet (2 mg total) by mouth 2 (two) times daily., Disp: 60 tablet, Rfl: 0   diphenhydrAMINE (BENADRYL) 25 MG tablet, Take 25 mg by mouth at bedtime., Disp: , Rfl:    diphenoxylate-atropine (LOMOTIL) 2.5-0.025 MG tablet, Take 2 tablets by mouth 4 (four) times daily as needed for diarrhea or loose stools., Disp: 240 tablet, Rfl: 0   divalproex (DEPAKOTE) 250 MG DR tablet, Take 250 mg by mouth at bedtime. , Disp: , Rfl:    folic acid (FOLVITE) 1 MG tablet, Take 1 tablet (1 mg total) by mouth daily., Disp: 90 tablet, Rfl: 1   ipratropium-albuterol (DUONEB) 0.5-2.5 (3) MG/3ML SOLN, Take 3 mLs by nebulization every 6 (six) hours as needed., Disp: 360 mL, Rfl: 3   lidocaine-prilocaine (EMLA) cream, Apply a small amount to port a cath site and cover with plastic wrap 1 hour prior to  chemotherapy appointments, Disp: 30 g, Rfl: 3   magnesium oxide (MAG-OX) 400 (240 Mg) MG tablet, Take 1 tablet (400 mg total) by mouth daily., Disp: 30 tablet, Rfl: 3   ondansetron (ZOFRAN ODT) 8 MG disintegrating tablet, Place 1 tablet under tongue every 8 hours as needed for nausea. You may begin using this medication on the third day after each chemotherapy treatment., Disp: 20 tablet, Rfl: 1   pantoprazole (PROTONIX) 40 MG tablet, TAKE 1 TABLET BY MOUTH DAILY BEFORE BREAKFAST, Disp: 90 tablet, Rfl: 3   prochlorperazine (COMPAZINE) 10 MG tablet, Take 1 tablet (10 mg total) by mouth every 6 (six) hours as needed for nausea or vomiting., Disp: 60 tablet, Rfl: 5   risperiDONE (RISPERDAL) 2 MG tablet, Take 2 mg by mouth at bedtime. , Disp: , Rfl:    scopolamine (TRANSDERM-SCOP) 1 MG/3DAYS, Place 1 patch (1.5 mg total) onto the skin every 3 (three) days., Disp: 10 patch, Rfl: 12   SYMBICORT 160-4.5 MCG/ACT inhaler, Inhale 2 puffs into the lungs 2 (two) times daily., Disp: 1 each, Rfl: 6   traMADol (ULTRAM) 50 MG tablet, TAKE (2) TABLETS BY MOUTH TWICE DAILY AS NEEDED., Disp: 120 tablet, Rfl: 0   traMADol HCl 100 MG TABS, Take 100 mg by mouth 2 (two) times daily as needed., Disp: 60 tablet, Rfl: 0   traZODone (DESYREL) 100 MG tablet, Take by mouth., Disp: , Rfl:    Allergies: Allergies  Allergen Reactions   Folic Acid Anaphylaxis    Not entirely clear if it is from folic acid. She is taking folic acid now and doesn't have the throat swelling anymore   Penicillins Anaphylaxis    Throat swelled Did it involve swelling of the face/tongue/throat,  SOB, or low BP? Yes Did it involve sudden or severe rash/hives, skin peeling, or any reaction on the inside of your mouth or nose? No Did you need to seek medical attention at a hospital or doctor's office? No When did it last happen?      childhood allergy If all above answers are "NO", may proceed with cephalosporin use.    Amoxicillin      hallucinations Did it involve swelling of the face/tongue/throat, SOB, or low BP? No Did it involve sudden or severe rash/hives, skin peeling, or any reaction on the inside of your mouth or nose? No Did you need to seek medical attention at a hospital or doctor's office? Yes When did it last happen?      July 2019 If all above answers are "NO", may proceed with cephalosporin use.    Fish Allergy Nausea Only   Other Swelling    Patient states that the sunrise brand folic acid made her feel like her throat was swelling.     REVIEW OF SYSTEMS:   Review of Systems  Constitutional:  Negative for chills, fatigue and fever.  HENT:   Negative for lump/mass, mouth sores, nosebleeds, sore throat and trouble swallowing.   Eyes:  Negative for eye problems.  Respiratory:  Negative for cough and shortness of breath.   Cardiovascular:  Negative for chest pain, leg swelling and palpitations.  Gastrointestinal:  Negative for abdominal pain, constipation, diarrhea, nausea and vomiting.  Genitourinary:  Negative for bladder incontinence, difficulty urinating, dysuria, frequency, hematuria and nocturia.   Musculoskeletal:  Negative for arthralgias, back pain, flank pain, myalgias and neck pain.  Skin:  Negative for itching and rash.  Neurological:  Negative for dizziness, headaches and numbness.  Hematological:  Does not bruise/bleed easily.  Psychiatric/Behavioral:  Negative for depression, sleep disturbance and suicidal ideas. The patient is not nervous/anxious.   All other systems reviewed and are negative.    VITALS:   There were no vitals taken for this visit.  Wt Readings from Last 3 Encounters:  07/20/22 150 lb 12.8 oz (68.4 kg)  07/10/22 152 lb 6.4 oz (69.1 kg)  06/29/22 154 lb 12.8 oz (70.2 kg)    There is no height or weight on file to calculate BMI.  Performance status (ECOG): 1 - Symptomatic but completely ambulatory  PHYSICAL EXAM:   Physical Exam Vitals and nursing note  reviewed. Exam conducted with a chaperone present.  Constitutional:      Appearance: Normal appearance.  Cardiovascular:     Rate and Rhythm: Normal rate and regular rhythm.     Pulses: Normal pulses.     Heart sounds: Normal heart sounds.  Pulmonary:     Effort: Pulmonary effort is normal.     Breath sounds: Normal breath sounds.  Abdominal:     Palpations: Abdomen is soft. There is no hepatomegaly, splenomegaly or mass.     Tenderness: There is no abdominal tenderness.  Musculoskeletal:     Right lower leg: No edema.     Left lower leg: No edema.  Lymphadenopathy:     Cervical: No cervical adenopathy.     Right cervical: No superficial, deep or posterior cervical adenopathy.    Left cervical: No superficial, deep or posterior cervical adenopathy.     Upper Body:     Right upper body: No supraclavicular or axillary adenopathy.     Left upper body: No supraclavicular or axillary adenopathy.  Neurological:     General: No focal deficit present.  Mental Status: She is alert and oriented to person, place, and time.  Psychiatric:        Mood and Affect: Mood normal.        Behavior: Behavior normal.     LABS:      Latest Ref Rng & Units 07/20/2022    8:54 AM 07/10/2022    9:27 AM 06/29/2022    8:31 AM  CBC  WBC 4.0 - 10.5 K/uL 7.9  5.7  11.4   Hemoglobin 12.0 - 15.0 g/dL 91.4  78.2  95.6   Hematocrit 36.0 - 46.0 % 41.6  38.1  43.7   Platelets 150 - 400 K/uL 245  166  139       Latest Ref Rng & Units 07/20/2022    8:54 AM 07/10/2022    9:27 AM 06/29/2022    8:31 AM  CMP  Glucose 70 - 99 mg/dL 213  086  578   BUN 6 - 20 mg/dL 8  6  14    Creatinine 0.44 - 1.00 mg/dL 4.69  6.29  5.28   Sodium 135 - 145 mmol/L 135  135  133   Potassium 3.5 - 5.1 mmol/L 3.2  3.9  3.5   Chloride 98 - 111 mmol/L 97  97  95   CO2 22 - 32 mmol/L 28  29  27    Calcium 8.9 - 10.3 mg/dL 9.0  8.7  8.7   Total Protein 6.5 - 8.1 g/dL 6.8  7.0  6.9   Total Bilirubin 0.3 - 1.2 mg/dL 0.6  0.4  0.8    Alkaline Phos 38 - 126 U/L 44  44  48  C  AST 15 - 41 U/L 18  41  19   ALT 0 - 44 U/L 16  35  23     C Corrected result      No results found for: "CEA1", "CEA" / No results found for: "CEA1", "CEA" No results found for: "PSA1" No results found for: "UXL244" No results found for: "CAN125"  No results found for: "TOTALPROTELP", "ALBUMINELP", "A1GS", "A2GS", "BETS", "BETA2SER", "GAMS", "MSPIKE", "SPEI" Lab Results  Component Value Date   TIBC 362 01/17/2018   FERRITIN 136 01/17/2018   IRONPCTSAT 40 (H) 01/17/2018   Lab Results  Component Value Date   LDH 146 05/01/2019   LDH 168 11/28/2017   LDH 185 08/24/2017     STUDIES:   No results found.

## 2022-08-10 ENCOUNTER — Inpatient Hospital Stay: Payer: Medicaid Other

## 2022-08-10 ENCOUNTER — Inpatient Hospital Stay: Payer: Medicaid Other | Attending: Hematology

## 2022-08-10 ENCOUNTER — Inpatient Hospital Stay (HOSPITAL_BASED_OUTPATIENT_CLINIC_OR_DEPARTMENT_OTHER): Payer: Medicaid Other | Admitting: Hematology

## 2022-08-10 VITALS — BP 111/45 | HR 99 | Temp 96.4°F | Resp 18

## 2022-08-10 DIAGNOSIS — Z5112 Encounter for antineoplastic immunotherapy: Secondary | ICD-10-CM | POA: Insufficient documentation

## 2022-08-10 DIAGNOSIS — C3491 Malignant neoplasm of unspecified part of right bronchus or lung: Secondary | ICD-10-CM

## 2022-08-10 DIAGNOSIS — K7460 Unspecified cirrhosis of liver: Secondary | ICD-10-CM | POA: Insufficient documentation

## 2022-08-10 DIAGNOSIS — C7951 Secondary malignant neoplasm of bone: Secondary | ICD-10-CM | POA: Insufficient documentation

## 2022-08-10 DIAGNOSIS — C3431 Malignant neoplasm of lower lobe, right bronchus or lung: Secondary | ICD-10-CM | POA: Insufficient documentation

## 2022-08-10 DIAGNOSIS — Z5111 Encounter for antineoplastic chemotherapy: Secondary | ICD-10-CM | POA: Insufficient documentation

## 2022-08-10 LAB — COMPREHENSIVE METABOLIC PANEL
ALT: 19 U/L (ref 0–44)
AST: 27 U/L (ref 15–41)
Albumin: 3.4 g/dL — ABNORMAL LOW (ref 3.5–5.0)
Alkaline Phosphatase: 45 U/L (ref 38–126)
Anion gap: 9 (ref 5–15)
BUN: 6 mg/dL (ref 6–20)
CO2: 27 mmol/L (ref 22–32)
Calcium: 8.5 mg/dL — ABNORMAL LOW (ref 8.9–10.3)
Chloride: 98 mmol/L (ref 98–111)
Creatinine, Ser: 0.56 mg/dL (ref 0.44–1.00)
GFR, Estimated: >60 mL/min (ref >60)
Glucose, Bld: 116 mg/dL — ABNORMAL HIGH (ref 70–99)
Potassium: 3.6 mmol/L (ref 3.5–5.1)
Sodium: 134 mmol/L — ABNORMAL LOW (ref 135–145)
Total Bilirubin: 0.7 mg/dL (ref 0.3–1.2)
Total Protein: 6.8 g/dL (ref 6.5–8.1)

## 2022-08-10 LAB — CBC WITH DIFFERENTIAL/PLATELET
Abs Immature Granulocytes: 0.02 10*3/uL (ref 0.00–0.07)
Basophils Absolute: 0 10*3/uL (ref 0.0–0.1)
Basophils Relative: 0 %
Eosinophils Absolute: 0 10*3/uL (ref 0.0–0.5)
Eosinophils Relative: 0 %
HCT: 37.4 % (ref 36.0–46.0)
Hemoglobin: 12.7 g/dL (ref 12.0–15.0)
Immature Granulocytes: 0 %
Lymphocytes Relative: 30 %
Lymphs Abs: 1.4 10*3/uL (ref 0.7–4.0)
MCH: 34.7 pg — ABNORMAL HIGH (ref 26.0–34.0)
MCHC: 34 g/dL (ref 30.0–36.0)
MCV: 102.2 fL — ABNORMAL HIGH (ref 80.0–100.0)
Monocytes Absolute: 0.6 10*3/uL (ref 0.1–1.0)
Monocytes Relative: 12 %
Neutro Abs: 2.7 10*3/uL (ref 1.7–7.7)
Neutrophils Relative %: 58 %
Platelets: 162 10*3/uL (ref 150–400)
RBC: 3.66 MIL/uL — ABNORMAL LOW (ref 3.87–5.11)
RDW: 15.7 % — ABNORMAL HIGH (ref 11.5–15.5)
WBC: 4.7 10*3/uL (ref 4.0–10.5)
nRBC: 0 % (ref 0.0–0.2)

## 2022-08-10 LAB — MAGNESIUM: Magnesium: 1.5 mg/dL — ABNORMAL LOW (ref 1.7–2.4)

## 2022-08-10 LAB — TSH: TSH: 2.227 u[IU]/mL (ref 0.350–4.500)

## 2022-08-10 MED ORDER — SODIUM CHLORIDE 0.9 % IV SOLN
150.0000 mg | Freq: Once | INTRAVENOUS | Status: AC
  Administered 2022-08-10: 150 mg via INTRAVENOUS
  Filled 2022-08-10: qty 150

## 2022-08-10 MED ORDER — SODIUM CHLORIDE 0.9 % IV SOLN
10.0000 mg | Freq: Once | INTRAVENOUS | Status: AC
  Administered 2022-08-10: 10 mg via INTRAVENOUS
  Filled 2022-08-10: qty 10

## 2022-08-10 MED ORDER — SODIUM CHLORIDE 0.9 % IV SOLN
400.0000 mg/m2 | Freq: Once | INTRAVENOUS | Status: AC
  Administered 2022-08-10: 700 mg via INTRAVENOUS
  Filled 2022-08-10: qty 20

## 2022-08-10 MED ORDER — SODIUM CHLORIDE 0.9 % IV SOLN
200.0000 mg | Freq: Once | INTRAVENOUS | Status: AC
  Administered 2022-08-10: 200 mg via INTRAVENOUS
  Filled 2022-08-10: qty 8

## 2022-08-10 MED ORDER — CYANOCOBALAMIN 1000 MCG/ML IJ SOLN
1000.0000 ug | Freq: Once | INTRAMUSCULAR | Status: AC
  Administered 2022-08-10: 1000 ug via INTRAMUSCULAR
  Filled 2022-08-10: qty 1

## 2022-08-10 MED ORDER — FAMOTIDINE IN NACL 20-0.9 MG/50ML-% IV SOLN
20.0000 mg | Freq: Once | INTRAVENOUS | Status: AC
  Administered 2022-08-10: 20 mg via INTRAVENOUS
  Filled 2022-08-10: qty 50

## 2022-08-10 MED ORDER — HEPARIN SOD (PORK) LOCK FLUSH 100 UNIT/ML IV SOLN
500.0000 [IU] | Freq: Once | INTRAVENOUS | Status: AC | PRN
  Administered 2022-08-10: 500 [IU]

## 2022-08-10 MED ORDER — MAGNESIUM SULFATE 2 GM/50ML IV SOLN
2.0000 g | Freq: Once | INTRAVENOUS | Status: AC
  Administered 2022-08-10: 2 g via INTRAVENOUS
  Filled 2022-08-10: qty 50

## 2022-08-10 MED ORDER — SODIUM CHLORIDE 0.9% FLUSH
10.0000 mL | INTRAVENOUS | Status: DC | PRN
  Administered 2022-08-10: 10 mL

## 2022-08-10 MED ORDER — SODIUM CHLORIDE 0.9 % IV SOLN
540.0000 mg | Freq: Once | INTRAVENOUS | Status: AC
  Administered 2022-08-10: 540 mg via INTRAVENOUS
  Filled 2022-08-10: qty 54

## 2022-08-10 MED ORDER — IPRATROPIUM-ALBUTEROL 0.5-2.5 (3) MG/3ML IN SOLN
3.0000 mL | Freq: Once | RESPIRATORY_TRACT | Status: AC
  Administered 2022-08-10: 3 mL via RESPIRATORY_TRACT

## 2022-08-10 MED ORDER — PALONOSETRON HCL INJECTION 0.25 MG/5ML
0.2500 mg | Freq: Once | INTRAVENOUS | Status: AC
  Administered 2022-08-10: 0.25 mg via INTRAVENOUS
  Filled 2022-08-10: qty 5

## 2022-08-10 MED ORDER — METHYLPREDNISOLONE SODIUM SUCC 125 MG IJ SOLR
125.0000 mg | Freq: Once | INTRAMUSCULAR | Status: AC | PRN
  Administered 2022-08-10: 125 mg via INTRAVENOUS

## 2022-08-10 MED ORDER — CETIRIZINE HCL 10 MG/ML IV SOLN
10.0000 mg | Freq: Once | INTRAVENOUS | Status: AC
  Administered 2022-08-10: 10 mg via INTRAVENOUS
  Filled 2022-08-10: qty 1

## 2022-08-10 MED ORDER — SODIUM CHLORIDE 0.9 % IV SOLN: Freq: Once | INTRAVENOUS | Status: AC

## 2022-08-10 NOTE — Patient Instructions (Signed)
[facility]  Discharge Instructions: Thank you for choosing [facility] to provide your oncology and hematology care.  If you have a lab appointment with the Cancer Center - please note that after April 8th, 2024, all labs will be drawn in the cancer center.  You do not have to check in or register with the main entrance as you have in the past but will complete your check-in in the cancer center.  Wear comfortable clothing and clothing appropriate for easy access to any Portacath or PICC line.   We strive to give you quality time with your provider. You may need to reschedule your appointment if you arrive late (15 or more minutes).  Arriving late affects you and other patients whose appointments are after yours.  Also, if you miss three or more appointments without notifying the office, you may be dismissed from the clinic at the provider's discretion.      For prescription refill requests, have your pharmacy contact our office and allow 72 hours for refills to be completed.    Today you received the following chemotherapy and/or immunotherapy agents Keytruda, Alimta and Carboplatin.  Carboplatin Injection What is this medication? CARBOPLATIN (KAR boe pla tin) treats some types of cancer. It works by slowing down the growth of cancer cells. This medicine may be used for other purposes; ask your health care provider or pharmacist if you have questions. COMMON BRAND NAME(S): Paraplatin What should I tell my care team before I take this medication? They need to know if you have any of these conditions: Blood disorders Hearing problems Kidney disease Recent or ongoing radiation therapy An unusual or allergic reaction to carboplatin, cisplatin, other medications, foods, dyes, or preservatives Pregnant or trying to get pregnant Breast-feeding How should I use this medication? This medication is injected into a vein. It is given by your care team in a hospital  or clinic setting. Talk to your care team about the use of this medication in children. Special care may be needed. Overdosage: If you think you have taken too much of this medicine contact a poison control center or emergency room at once. NOTE: This medicine is only for you. Do not share this medicine with others. What if I miss a dose? Keep appointments for follow-up doses. It is important not to miss your dose. Call your care team if you are unable to keep an appointment. What may interact with this medication? Medications for seizures Some antibiotics, such as amikacin, gentamicin, neomycin, streptomycin, tobramycin Vaccines This list may not describe all possible interactions. Give your health care provider a list of all the medicines, herbs, non-prescription drugs, or dietary supplements you use. Also tell them if you smoke, drink alcohol, or use illegal drugs. Some items may interact with your medicine. What should I watch for while using this medication? Your condition will be monitored carefully while you are receiving this medication. You may need blood work while taking this medication. This medication may make you feel generally unwell. This is not uncommon, as chemotherapy can affect healthy cells as well as cancer cells. Report any side effects. Continue your course of treatment even though you feel ill unless your care team tells you to stop. In some cases, you may be given additional medications to help with side effects. Follow all directions for their use. This medication may increase your risk of getting an infection. Call your care team for advice if you get a fever, chills, sore  throat, or other symptoms of a cold or flu. Do not treat yourself. Try to avoid being around people who are sick. Avoid taking medications that contain aspirin, acetaminophen, ibuprofen, naproxen, or ketoprofen unless instructed by your care team. These medications may hide a fever. Be careful brushing  or flossing your teeth or using a toothpick because you may get an infection or bleed more easily. If you have any dental work done, tell your dentist you are receiving this medication. Talk to your care team if you wish to become pregnant or think you might be pregnant. This medication can cause serious birth defects. Talk to your care team about effective forms of contraception. Do not breast-feed while taking this medication. What side effects may I notice from receiving this medication? Side effects that you should report to your care team as soon as possible: Allergic reactions--skin rash, itching, hives, swelling of the face, lips, tongue, or throat Infection--fever, chills, cough, sore throat, wounds that don't heal, pain or trouble when passing urine, general feeling of discomfort or being unwell Low red blood cell level--unusual weakness or fatigue, dizziness, headache, trouble breathing Pain, tingling, or numbness in the hands or feet, muscle weakness, change in vision, confusion or trouble speaking, loss of balance or coordination, trouble walking, seizures Unusual bruising or bleeding Side effects that usually do not require medical attention (report to your care team if they continue or are bothersome): Hair loss Nausea Unusual weakness or fatigue Vomiting This list may not describe all possible side effects. Call your doctor for medical advice about side effects. You may report side effects to FDA at 1-800-FDA-1088. Where should I keep my medication? This medication is given in a hospital or clinic. It will not be stored at home. NOTE: This sheet is a summary. It may not cover all possible information. If you have questions about this medicine, talk to your doctor, pharmacist, or health care provider.  2024 Elsevier/Gold Standard (2021-05-10 00:00:00) Pemetrexed Injection What is this medication? PEMETREXED (PEM e TREX ed) treats some types of cancer. It works by slowing down the  growth of cancer cells. This medicine may be used for other purposes; ask your health care provider or pharmacist if you have questions. COMMON BRAND NAME(S): Alimta, PEMFEXY What should I tell my care team before I take this medication? They need to know if you have any of these conditions: Infection, such as chickenpox, cold sores, or herpes Kidney disease Low blood cell levels (white cells, red cells, and platelets) Lung or breathing disease, such as asthma Radiation therapy An unusual or allergic reaction to pemetrexed, other medications, foods, dyes, or preservatives If you or your partner are pregnant or trying to get pregnant Breast-feeding How should I use this medication? This medication is injected into a vein. It is given by your care team in a hospital or clinic setting. Talk to your care team about the use of this medication in children. Special care may be needed. Overdosage: If you think you have taken too much of this medicine contact a poison control center or emergency room at once. NOTE: This medicine is only for you. Do not share this medicine with others. What if I miss a dose? Keep appointments for follow-up doses. It is important not to miss your dose. Call your care team if you are unable to keep an appointment. What may interact with this medication? Do not take this medication with any of the following: Live virus vaccines This medication may also interact  with the following: Ibuprofen This list may not describe all possible interactions. Give your health care provider a list of all the medicines, herbs, non-prescription drugs, or dietary supplements you use. Also tell them if you smoke, drink alcohol, or use illegal drugs. Some items may interact with your medicine. What should I watch for while using this medication? Your condition will be monitored carefully while you are receiving this medication. This medication may make you feel generally unwell. This is  not uncommon as chemotherapy can affect healthy cells as well as cancer cells. Report any side effects. Continue your course of treatment even though you feel ill unless your care team tells you to stop. This medication can cause serious side effects. To reduce the risk, your care team may give you other medications to take before receiving this one. Be sure to follow the directions from your care team. This medication can cause a rash or redness in areas of the body that have previously had radiation therapy. If you have had radiation therapy, tell your care team if you notice a rash in this area. This medication may increase your risk of getting an infection. Call your care team for advice if you get a fever, chills, sore throat, or other symptoms of a cold or flu. Do not treat yourself. Try to avoid being around people who are sick. Be careful brushing or flossing your teeth or using a toothpick because you may get an infection or bleed more easily. If you have any dental work done, tell your dentist you are receiving this medication. Avoid taking medications that contain aspirin, acetaminophen, ibuprofen, naproxen, or ketoprofen unless instructed by your care team. These medications may hide a fever. Check with your care team if you have severe diarrhea, nausea, and vomiting, or if you sweat a lot. The loss of too much body fluid may make it dangerous for you to take this medication. Talk to your care team if you or your partner wish to become pregnant or think either of you might be pregnant. This medication can cause serious birth defects if taken during pregnancy and for 6 months after the last dose. A negative pregnancy test is required before starting this medication. A reliable form of contraception is recommended while taking this medication and for 6 months after the last dose. Talk to your care team about reliable forms of contraception. Do not father a child while taking this medication and for  3 months after the last dose. Use a condom while having sex during this time period. Do not breastfeed while taking this medication and for 1 week after the last dose. This medication may cause infertility. Talk to your care team if you are concerned about your fertility. What side effects may I notice from receiving this medication? Side effects that you should report to your care team as soon as possible: Allergic reactions--skin rash, itching, hives, swelling of the face, lips, tongue, or throat Dry cough, shortness of breath or trouble breathing Infection--fever, chills, cough, sore throat, wounds that don't heal, pain or trouble when passing urine, general feeling of discomfort or being unwell Kidney injury--decrease in the amount of urine, swelling of the ankles, hands, or feet Low red blood cell level--unusual weakness or fatigue, dizziness, headache, trouble breathing Redness, blistering, peeling, or loosening of the skin, including inside the mouth Unusual bruising or bleeding Side effects that usually do not require medical attention (report to your care team if they continue or are bothersome): Fatigue Loss  of appetite Nausea Vomiting This list may not describe all possible side effects. Call your doctor for medical advice about side effects. You may report side effects to FDA at 1-800-FDA-1088. Where should I keep my medication? This medication is given in a hospital or clinic. It will not be stored at home. NOTE: This sheet is a summary. It may not cover all possible information. If you have questions about this medicine, talk to your doctor, pharmacist, or health care provider.  2024 Elsevier/Gold Standard (2021-05-24 00:00:00) Pembrolizumab Injection What is this medication? PEMBROLIZUMAB (PEM broe LIZ ue mab) treats some types of cancer. It works by helping your immune system slow or stop the spread of cancer cells. It is a monoclonal antibody. This medicine may be used for  other purposes; ask your health care provider or pharmacist if you have questions. COMMON BRAND NAME(S): Keytruda What should I tell my care team before I take this medication? They need to know if you have any of these conditions: Allogeneic stem cell transplant (uses someone else's stem cells) Autoimmune diseases, such as Crohn disease, ulcerative colitis, lupus History of chest radiation Nervous system problems, such as Guillain-Barre syndrome, myasthenia gravis Organ transplant An unusual or allergic reaction to pembrolizumab, other medications, foods, dyes, or preservatives Pregnant or trying to get pregnant Breast-feeding How should I use this medication? This medication is injected into a vein. It is given by your care team in a hospital or clinic setting. A special MedGuide will be given to you before each treatment. Be sure to read this information carefully each time. Talk to your care team about the use of this medication in children. While it may be prescribed for children as young as 6 months for selected conditions, precautions do apply. Overdosage: If you think you have taken too much of this medicine contact a poison control center or emergency room at once. NOTE: This medicine is only for you. Do not share this medicine with others. What if I miss a dose? Keep appointments for follow-up doses. It is important not to miss your dose. Call your care team if you are unable to keep an appointment. What may interact with this medication? Interactions have not been studied. This list may not describe all possible interactions. Give your health care provider a list of all the medicines, herbs, non-prescription drugs, or dietary supplements you use. Also tell them if you smoke, drink alcohol, or use illegal drugs. Some items may interact with your medicine. What should I watch for while using this medication? Your condition will be monitored carefully while you are receiving this  medication. You may need blood work while taking this medication. This medication may cause serious skin reactions. They can happen weeks to months after starting the medication. Contact your care team right away if you notice fevers or flu-like symptoms with a rash. The rash may be red or purple and then turn into blisters or peeling of the skin. You may also notice a red rash with swelling of the face, lips, or lymph nodes in your neck or under your arms. Tell your care team right away if you have any change in your eyesight. Talk to your care team if you may be pregnant. Serious birth defects can occur if you take this medication during pregnancy and for 4 months after the last dose. You will need a negative pregnancy test before starting this medication. Contraception is recommended while taking this medication and for 4 months after the last dose.  Your care team can help you find the option that works for you. Do not breastfeed while taking this medication and for 4 months after the last dose. What side effects may I notice from receiving this medication? Side effects that you should report to your care team as soon as possible: Allergic reactions--skin rash, itching, hives, swelling of the face, lips, tongue, or throat Dry cough, shortness of breath or trouble breathing Eye pain, redness, irritation, or discharge with blurry or decreased vision Heart muscle inflammation--unusual weakness or fatigue, shortness of breath, chest pain, fast or irregular heartbeat, dizziness, swelling of the ankles, feet, or hands Hormone gland problems--headache, sensitivity to light, unusual weakness or fatigue, dizziness, fast or irregular heartbeat, increased sensitivity to cold or heat, excessive sweating, constipation, hair loss, increased thirst or amount of urine, tremors or shaking, irritability Infusion reactions--chest pain, shortness of breath or trouble breathing, feeling faint or lightheaded Kidney injury  (glomerulonephritis)--decrease in the amount of urine, red or dark brown urine, foamy or bubbly urine, swelling of the ankles, hands, or feet Liver injury--right upper belly pain, loss of appetite, nausea, light-colored stool, dark yellow or brown urine, yellowing skin or eyes, unusual weakness or fatigue Pain, tingling, or numbness in the hands or feet, muscle weakness, change in vision, confusion or trouble speaking, loss of balance or coordination, trouble walking, seizures Rash, fever, and swollen lymph nodes Redness, blistering, peeling, or loosening of the skin, including inside the mouth Sudden or severe stomach pain, bloody diarrhea, fever, nausea, vomiting Side effects that usually do not require medical attention (report to your care team if they continue or are bothersome): Bone, joint, or muscle pain Diarrhea Fatigue Loss of appetite Nausea Skin rash This list may not describe all possible side effects. Call your doctor for medical advice about side effects. You may report side effects to FDA at 1-800-FDA-1088. Where should I keep my medication? This medication is given in a hospital or clinic. It will not be stored at home. NOTE: This sheet is a summary. It may not cover all possible information. If you have questions about this medicine, talk to your doctor, pharmacist, or health care provider.  2024 Elsevier/Gold Standard (2021-05-31 00:00:00)       To help prevent nausea and vomiting after your treatment, we encourage you to take your nausea medication as directed.  BELOW ARE SYMPTOMS THAT SHOULD BE REPORTED IMMEDIATELY: *FEVER GREATER THAN 100.4 F (38 C) OR HIGHER *CHILLS OR SWEATING *NAUSEA AND VOMITING THAT IS NOT CONTROLLED WITH YOUR NAUSEA MEDICATION *UNUSUAL SHORTNESS OF BREATH *UNUSUAL BRUISING OR BLEEDING *URINARY PROBLEMS (pain or burning when urinating, or frequent urination) *BOWEL PROBLEMS (unusual diarrhea, constipation, pain near the anus) TENDERNESS IN  MOUTH AND THROAT WITH OR WITHOUT PRESENCE OF ULCERS (sore throat, sores in mouth, or a toothache) UNUSUAL RASH, SWELLING OR PAIN  UNUSUAL VAGINAL DISCHARGE OR ITCHING   Items with * indicate a potential emergency and should be followed up as soon as possible or go to the Emergency Department if any problems should occur.  Please show the CHEMOTHERAPY ALERT CARD or IMMUNOTHERAPY ALERT CARD at check-in to the Emergency Department and triage nurse.  Should you have questions after your visit or need to cancel or reschedule your appointment, please contact [facility] [phone]  and follow the prompts.  Office hours are 8:00 a.m. to 4:30 p.m. Monday - Friday. Please note that voicemails left after 4:00 p.m. may not be returned until the following business day.  We  are closed weekends and major holidays. You have access to a nurse at all times for urgent questions. Please call the main number to the clinic [phone] and follow the prompts.  For any non-urgent questions, you may also contact your provider using MyChart. We now offer e-Visits for anyone 54 and older to request care online for non-urgent symptoms. For details visit [email or web address].   Also download the MyChart app! Go to the app store, search "MyChart", open the app, select , and log in with your MyChart username and password.

## 2022-08-10 NOTE — Patient Instructions (Signed)
[facility] at [facility] Discharge Instructions   You were seen and examined today by Dr. Ellin Saba.  He reviewed the results of your lab work which are normal/stable. Your magnesium is low. We will give you IV magnesium in the clinic today.   We will proceed with your treatment today.   Return as scheduled.    Thank you for choosing [facility] at [facility] to provide your oncology and hematology care.  To afford each patient quality time with our provider, please arrive at least 15 minutes before your scheduled appointment time.   If you have a lab appointment with the Cancer Center please come in thru the Main Entrance and check in at the main information desk.  You need to re-schedule your appointment should you arrive 10 or more minutes late.  We strive to give you quality time with our providers, and arriving late affects you and other patients whose appointments are after yours.  Also, if you no show three or more times for appointments you may be dismissed from the clinic at the providers discretion.     Again, thank you for choosing [facility].  Our hope is that these requests will decrease the amount of time that you wait before being seen by our physicians.       _____________________________________________________________  Should you have questions after your visit to [facility], please contact our office at [phone] and follow the prompts.  Our office hours are 8:00 a.m. and 4:30 p.m. Monday - Friday.  Please note that voicemails left after 4:00 p.m. may not be returned until the following business day.  We are closed weekends and major holidays.  You do have access to a nurse 24-7, just call the main number to the clinic [phone] and do not press any options, hold on the line and a nurse will answer the phone.    For prescription refill requests, have your pharmacy contact our office and allow  72 hours.    Due to Covid, you will need to wear a mask upon entering the hospital. If you do not have a mask, a mask will be given to you at the Main Entrance upon arrival. For doctor visits, patients may have 1 support person age 58 or older with them. For treatment visits, patients can not have anyone with them due to social distancing guidelines and our immunocompromised population.

## 2022-08-10 NOTE — Progress Notes (Addendum)
Patient presents today for Keytruda, Alimta, and Carboplatin with follow up visit with Dr. Ellin Saba. Labs within parameters for treatment. Magnesium level 1.5. Standing orders followed. Patient will receive 2 grams of magnesium sulfate IV. B12 injection with treatement today given.   14:43 Patient coughing, complaints of nausea and feeling hot per patient words. Carboplatin stopped.   14:44 500 ml bolus of normal saline initiated.   14:45 95/52, 97.8, 116, 86% 02 on room air. Oxygen placed on patient and set on 2 liters / hr by nasal cannula.   14:48 90% on 2 liters of O2.   14:51 Dr. Ellin Saba at the bedside.   14:52 orders received to give Solumedrol 125 mg IV x 1 dose now. 14:52 Solumedrol given IV push.  Orders received to give DUONEB now.   15:04 95/85, 107, 19, 99% on 2 Liters of O2 by nasal cannula.   1505 Respiratory at the bedside. DUONEB given.   1512 100/60 , 107, 96%, 19 .   1528 Dr. Ellin Saba at the bedside to assess patient. Orders received to restart Carboplatin.   1529 Carboplatin restarted.  Hypersensitivity Reaction note  Date of event: 08/10/22 Time of event: 14:43 Generic name of drug involved: Carboplatin.  Name of provider notified of the hypersensitivity reaction: Dr. Ellin Saba. Was agent that likely caused hypersensitivity reaction added to Allergies List within EMR? Yes. Chain of events including reaction signs/symptoms, treatment administered, and outcome (e.g., drug resumed; drug discontinued; sent to Emergency Department; etc.) Drug Resumed.   Carren Rang, RN 08/10/2022 4:13 PM   Treatment given today per MD orders. Adverse drug reaction noted. See progress notes. Carboplatin restarted and finished with no new reactions.  Vital signs stable. Sats 96% on room air.  No complaints at this time. Discharged from clinic ambulatory in stable condition. Alert and oriented x 3. F/U with [facility] as scheduled.

## 2022-08-11 ENCOUNTER — Encounter: Payer: Self-pay | Admitting: Hematology

## 2022-08-13 ENCOUNTER — Other Ambulatory Visit: Payer: Self-pay

## 2022-08-29 ENCOUNTER — Other Ambulatory Visit: Payer: Self-pay

## 2022-08-29 NOTE — Progress Notes (Signed)
[facility] [address], [place] [address]    Clinic Day:  08/30/22    Referring physician: Benita Stabile, MD  Patient Care Team: Benita Stabile, MD as PCP - General (Internal Medicine) Jena Gauss Gerrit Friends, MD as Consulting Physician (Gastroenterology) Doreatha Massed, MD as Consulting Physician (Medical Oncology)   ASSESSMENT & PLAN:   Assessment: 1.  Metastatic lung adenocarcinoma: -PET scan on 05/13/2019 showed right lower lobe nodule concerning for bronchogenic carcinoma.  Cirrhotic liver. -Right lower lobectomy and lymph node excision on 06/19/2019. -Pathology showed 1.7 cm right lower lobe adenocarcinoma, unifocal, lymphovascular invasion present.  Margins negative.  2/14 lymph nodes positive (level 7 and 10R).  PT1CPN2. -PD-L1 30%, K-ras G12A, MSI stable.  EGFR mutation not identified. -4 cycles of adjuvant carboplatin and pemetrexed from 09/09/2019 through 11/11/2019. - CT chest on 12/29/2021: Lytic metastatic lesion in the posterior left fifth rib.  Interval enlargement of cavitary nodule of the dependent right upper lobe.  Numerous new clustered nodules of varying sizes. - PET scan (01/19/2022): Cavitary nodule of concern in the right middle lobe measures 1.3 x 0.7 cm, SUV max of 1.8.  No other hypermetabolic or suspicious lung nodules.  Expansile lytic metastasis involving left fifth rib hypermetabolic measuring 2.6 x 1.9 cm with SUV 4.2. - Left rib biopsy (02/06/2022): Metastatic moderately to poorly differentiated adenocarcinoma - NGS by [facility]: PD-L1 (22 C3)-TPS 5%.  TMB-high.  K-ras G12 A pathogenic variant.  T p53 pathogenic variant.  Other targetable mutations negative.  MSI-stable. - XRT to the left rib lesion completed - SBRT to the T5 lesion from 06/12/2022 through 06/16/2022 - Cycle 1 of carboplatin, pemetrexed and pembrolizumab on 06/29/2022    Plan: 1.  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 - She reported decreased appetite.  However weight is stable. - She has intermittent diarrhea, less than 3 watery stools per day. - Reviewed labs today: Normal LFTs other than mildly elevated AST of 67.  Creatinine normal.  CBC grossly normal.  TSH is 2.227. - She will proceed with cycle 4 today.  We will obtain a PET scan prior to next visit in 3 weeks.  We will start her on maintenance pemetrexed and pembrolizumab at that point based on response.   2.  Left mid back pain: - Continue tramadol 100 mg daily as needed.   3.  Hypomagnesemia: - She ran out of magnesium.  Magnesium is 1.3 today. - Will increase home magnesium to 1 tablet twice daily.  She will receive IV magnesium today.   Addendum: - I was called to assess this patient as she was having a reaction.  Towards the end of carboplatin infusion, she developed erythema of the face and some nausea.  Her hands were itching. - We gave her some Ativan.  Symptoms improved.  Orders Placed This Encounter  Procedures   NM PET Image Restag (PS) Skull Base To Thigh    Standing Status:   Future    Standing Expiration Date:   08/30/2023    Order Specific Question:   If indicated for the ordered procedure, I authorize the administration of a radiopharmaceutical per Radiology protocol    Answer:   Yes    Order Specific Question:   Is the patient pregnant?    Answer:   No    Order Specific Question:   Preferred imaging location?    Answer:   [facility]  [name],acting as a Neurosurgeon for Doreatha Massed, MD.,have documented all relevant documentation on the behalf of Doreatha Massed, MD,as directed by  Doreatha Massed, MD while in the presence of Doreatha Massed, MD.  I, Doreatha Massed MD, have reviewed the above documentation for accuracy and completeness, and I agree with the above.     Doreatha Massed, MD   7/31/20245:44 PM  CHIEF COMPLAINT:   Diagnosis: metastatic right  lung cancer    Cancer Staging  Non-small cell cancer of right lung [facility]) Staging form: Lung, AJCC 8th Edition - Clinical stage from 02/13/2022: Stage IV (cT1c, cN2, pM1) - Signed by Doreatha Massed, MD on 02/13/2022    Prior Therapy: 1. Right lower lobectomy on 06/19/2019. 2. Carboplatin and pemetrexed x 4 cycles from 09/09/2019 to 11/11/2019 3. SBRT to left rib/chest wall lesion, completed 03/22/22 4. SBRT to T5 lesion, 06/12/22 - 06/16/22  Current Therapy:  carboplatin, pemetrexed and pembrolizumab    HISTORY OF PRESENT ILLNESS:   Oncology History  Adenocarcinoma of lung, stage 3, right (HCC)  07/02/2019 Initial Diagnosis   Adenocarcinoma of lung, stage 3, right (HCC)   07/18/2019 Genetic Testing   Foundation One     07/23/2019 Genetic Testing   PD-L1     09/09/2019 - 11/11/2019 Chemotherapy   Patient is on Treatment Plan : LUNG NSCLC Pemetrexed (Alimta) / Carboplatin q21d x 4 cycles     Non-small cell cancer of right lung (HCC)  02/13/2022 Initial Diagnosis   Non-small cell cancer of right lung (HCC)   02/13/2022 Cancer Staging   Staging form: Lung, AJCC 8th Edition - Clinical stage from 02/13/2022: Stage IV (cT1c, cN2, pM1) - Signed by Doreatha Massed, MD on 02/13/2022 Histopathologic type: Adenocarcinoma, NOS Stage prefix: Initial diagnosis Histologic grade (G): G3 Histologic grading system: 4 grade system   06/29/2022 -  Chemotherapy   Patient is on Treatment Plan : LUNG Carboplatin (5) + Pemetrexed (500) + Pembrolizumab (200) D1 q21d Induction x 4 cycles / Maintenance Pemetrexed (500) + Pembrolizumab (200) D1 q21d        INTERVAL HISTORY:   Rachel Gross is a 57 y.o. female presenting to clinic today for follow up of metastatic right lung cancer. She was last seen by me on 08/10/22.  Today, she states that she is doing well overall. Her appetite level is at 0%. Her energy level is at 25%.  She reports a decreased appetite and does not have adequate p.o. intake. She  is taking Folic Acid daily and Tramadol every other day. She is out of Magnesium pills and notes the last time she took Magnesium was last week. She is sedentary most of the day, watching TV. She notes occasional back pain. She did not take her nebulizer today.   She c/o diarrhea and notes it is worsened when she does not eat. She has diarrhea 2-3 x a day. She would like to have Lomotil for diarrhea refilled as she is almost out.   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    Bipolar disorder (HCC)    Cancer (HCC)    right lung   COPD (chronic obstructive pulmonary disease) (HCC)    Depression    Dyspnea    Emphysema lung (HCC)    GERD (gastroesophageal reflux disease)  Hepatitis C    s/p treatment with Epclusa   History of HPV infection    History of pneumonia    Pneumonia    Smoker     Surgical History: Past Surgical History:  Procedure Laterality Date   BIOPSY  05/02/2018   Procedure: BIOPSY;  Surgeon: Corbin Ade, MD;  [facility];  Service: Endoscopy;;  gastric   CESAREAN SECTION     CHEST TUBE INSERTION Right 06/26/2019   Procedure: Right CHEST TUBE REPLACEMENT;  Surgeon: Loreli Slot, MD;  Location: [facility] OR;  Service: Thoracic;  Laterality: Right;   COLONOSCOPY WITH PROPOFOL N/A 03/03/2019   Procedure: COLONOSCOPY WITH PROPOFOL;  Surgeon: Corbin Ade, MD;  [facility]: [facility];  Service: Endoscopy;  Laterality: N/A;  9:15am   ESOPHAGOGASTRODUODENOSCOPY (EGD) WITH PROPOFOL N/A 05/02/2018   Procedure: ESOPHAGOGASTRODUODENOSCOPY (EGD) WITH PROPOFOL;  Surgeon: Corbin Ade, MD;  [facility];  Service: Endoscopy;  Laterality: N/A;  8:30am   EYE SURGERY Bilateral    cataract   HEMOSTASIS CLIP PLACEMENT  03/03/2019   Procedure: HEMOSTASIS CLIP PLACEMENT;   Surgeon: Corbin Ade, MD;  [facility];  Service: Endoscopy;;   INTERCOSTAL NERVE BLOCK Right 06/19/2019   Procedure: Intercostal Nerve Block;  Surgeon: Loreli Slot, MD;  Location: [facility] OR;  Service: Thoracic;  Laterality: Right;   IR US GUIDE VASC ACCESS RIGHT  03/13/2018   LOBECTOMY     LYMPH NODE DISSECTION Right 06/19/2019   Procedure: Lymph Node Dissection;  Surgeon: Loreli Slot, MD;  Location: [facility] OR;  Service: Thoracic;  Laterality: Right;   POLYPECTOMY  03/03/2019   Procedure: POLYPECTOMY;  Surgeon: Corbin Ade, MD;  [facility]: [facility];  Service: Endoscopy;;   PORTACATH PLACEMENT Left 09/08/2019   Procedure: INSERTION PORT-A-CATH LEFT CHEST (attached catheter in left subclavian);  Surgeon: Franky Macho, MD;  Location: [facility];  Service: General;  Laterality: Left;   TONSILLECTOMY     TOOTH EXTRACTION  01/31/2018   x 7   VIDEO BRONCHOSCOPY WITH INSERTION OF INTERBRONCHIAL VALVE (IBV) N/A 06/26/2019   Procedure: VIDEO BRONCHOSCOPY WITH INSERTION OF TWO INTERBRONCHIAL VALVE (IBV);  Surgeon: Loreli Slot, MD;  Location: [facility] OR;  Service: Thoracic;  Laterality: N/A;   VIDEO BRONCHOSCOPY WITH INSERTION OF INTERBRONCHIAL VALVE (IBV) N/A 08/08/2019   Procedure: VIDEO BRONCHOSCOPY WITH REMOVAL OF INTERBRONCHIAL VALVE (IBV);  Surgeon: Loreli Slot, MD;  Location: [facility] OR;  Service: Thoracic;  Laterality: N/A;    Social History: Social History   Socioeconomic History   Marital status: Single    Spouse name: Not on file   Number of children: Not on file   Years of education: Not on file   Highest education level: Not on file  Occupational History   Not on file  Tobacco Use   Smoking status: Former    Current packs/day: 0.00    Average packs/day: 0.5 packs/day for 38.0 years (19.0 ttl pk-yrs)    Types: Cigarettes    Start date: 06/18/1981    Quit date: 06/19/2019    Years since quitting: 3.2   Smokeless tobacco: Never  Vaping Use    Vaping status: Never Used  Substance and Sexual Activity   Alcohol use: Not Currently    Comment: Last use of alcohol 03/2016   Drug use: Not Currently    Types: 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   Sexual activity: Not Currently  Other Topics Concern  Not on file  Social History Narrative   Not on file   Social Determinants of Health   Financial Resource Strain: Medium Risk (12/10/2019)   Overall Financial Resource Strain (CARDIA)    Difficulty of Paying Living Expenses: Somewhat hard  Food Insecurity: Food Insecurity Present (12/10/2019)   Hunger Vital Sign    Worried About Running Out of Food in the Last Year: Often true    Ran Out of Food in the Last Year: Often true  Transportation Needs: No Transportation Needs (12/10/2019)   PRAPARE - Administrator, Civil Service (Medical): No    Lack of Transportation (Non-Medical): No  Physical Activity: Sufficiently Active (12/10/2019)   Exercise Vital Sign    Days of Exercise per Week: 3 days    Minutes of Exercise per Session: 150+ min  Stress: Stress Concern Present (12/10/2019)   [employer] of Occupational Health - Occupational Stress Questionnaire    Feeling of Stress : Very much  Social Connections: Socially Isolated (12/10/2019)   Social Connection and Isolation Panel [NHANES]    Frequency of Communication with Friends and Family: More than three times a week    Frequency of Social Gatherings with Friends and Family: Once a week    Attends Religious Services: Never    Database administrator or Organizations: No    Attends Banker Meetings: Never    Marital Status: Divorced  Catering manager Violence: Not At Risk (12/10/2019)   Humiliation, Afraid, Rape, and Kick questionnaire    Fear of Current or Ex-Partner: No    Emotionally Abused: No    Physically Abused: No    Sexually Abused: No    Family History: Family History  Problem Relation Age of Onset   Congenital heart disease Mother     Bipolar disorder Mother    Prostate cancer Brother    Alzheimer's disease Paternal Grandmother    Colon cancer Neg Hx     Current Medications:  Current Outpatient Medications:    albuterol (VENTOLIN HFA) 108 (90 Base) MCG/ACT inhaler, Inhale 2 puffs into the lungs every 4 (four) hours as needed for wheezing or shortness of breath., Disp: , Rfl:    chlorhexidine (PERIDEX) 0.12 % solution, SMARTSIG:By Mouth, Disp: , Rfl:    clindamycin (CLEOCIN) 300 MG capsule, Take 300 mg by mouth 3 (three) times daily., Disp: , Rfl:    dexamethasone (DECADRON) 2 MG tablet, Take 1 tablet (2 mg total) by mouth 2 (two) times daily., Disp: 60 tablet, Rfl: 0   diphenhydrAMINE (BENADRYL) 25 MG tablet, Take 25 mg by mouth at bedtime., Disp: , Rfl:    diphenoxylate-atropine (LOMOTIL) 2.5-0.025 MG tablet, Take 2 tablets by mouth 4 (four) times daily as needed for diarrhea or loose stools., Disp: 240 tablet, Rfl: 0   divalproex (DEPAKOTE) 250 MG DR tablet, Take 250 mg by mouth at bedtime. , Disp: , Rfl:    folic acid (FOLVITE) 1 MG tablet, Take 1 tablet (1 mg total) by mouth daily., Disp: 90 tablet, Rfl: 1   ipratropium-albuterol (DUONEB) 0.5-2.5 (3) MG/3ML SOLN, Take 3 mLs by nebulization every 6 (six) hours as needed., Disp: 360 mL, Rfl: 3   lidocaine-prilocaine (EMLA) cream, Apply a small amount to port a cath site and cover with plastic wrap 1 hour prior to chemotherapy appointments, Disp: 30 g, Rfl: 3   ondansetron (ZOFRAN ODT) 8 MG disintegrating tablet, Place 1 tablet under tongue every 8 hours as needed for nausea. You may begin using  this medication on the third day after each chemotherapy treatment., Disp: 20 tablet, Rfl: 1   pantoprazole (PROTONIX) 40 MG tablet, TAKE 1 TABLET BY MOUTH DAILY BEFORE BREAKFAST, Disp: 90 tablet, Rfl: 3   prochlorperazine (COMPAZINE) 10 MG tablet, Take 1 tablet (10 mg total) by mouth every 6 (six) hours as needed for nausea or vomiting., Disp: 60 tablet, Rfl: 5   risperiDONE  (RISPERDAL) 2 MG tablet, Take 2 mg by mouth at bedtime. , Disp: , Rfl:    scopolamine (TRANSDERM-SCOP) 1 MG/3DAYS, Place 1 patch (1.5 mg total) onto the skin every 3 (three) days., Disp: 10 patch, Rfl: 12   SYMBICORT 160-4.5 MCG/ACT inhaler, Inhale 2 puffs into the lungs 2 (two) times daily., Disp: 1 each, Rfl: 6   traMADol HCl 100 MG TABS, Take 100 mg by mouth 2 (two) times daily as needed., Disp: 60 tablet, Rfl: 0   traZODone (DESYREL) 100 MG tablet, Take by mouth., Disp: , Rfl:    magnesium oxide (MAG-OX) 400 (240 Mg) MG tablet, Take 1 tablet (400 mg total) by mouth 2 (two) times daily., Disp: 120 tablet, Rfl: 4 No current facility-administered medications for this visit.  Facility-Administered Medications Ordered in Other Visits:    sodium chloride flush (NS) 0.9 % injection 10 mL, 10 mL, Intracatheter, PRN, Doreatha Massed, MD, 10 mL at 08/30/22 1605   Allergies: Allergies  Allergen Reactions   Folic Acid Anaphylaxis    Not entirely clear if it is from folic acid. She is taking folic acid now and doesn't have the throat swelling anymore   Penicillins Anaphylaxis    Throat swelled Did it involve swelling of the face/tongue/throat, SOB, or low BP? Yes Did it involve sudden or severe rash/hives, skin peeling, or any reaction on the inside of your mouth or nose? No Did you need to seek medical attention at a hospital or doctor's office? No When did it last happen?      childhood allergy If all above answers are "NO", may proceed with cephalosporin use.    Amoxicillin     hallucinations Did it involve swelling of the face/tongue/throat, SOB, or low BP? No Did it involve sudden or severe rash/hives, skin peeling, or any reaction on the inside of your mouth or nose? No Did you need to seek medical attention at a hospital or doctor's office? Yes When did it last happen?      July 2019 If all above answers are "NO", may proceed with cephalosporin use.    Carboplatin Nausea Only,  Other (See Comments) and Cough    Patient complaints of feeling hot and flushed. See progress note from 7/11. Patient able to complete infusion after additional medications given.  Fingers itch, nausea   Fish Allergy Nausea Only   Other Swelling    Patient states that the sunrise brand folic acid made her feel like her throat was swelling.     REVIEW OF SYSTEMS:   Review of Systems  Constitutional:  Negative for chills, fatigue and fever.  HENT:   Positive for trouble swallowing. Negative for lump/mass, mouth sores, nosebleeds and sore throat.   Eyes:  Negative for eye problems.  Respiratory:  Positive for cough and shortness of breath.   Cardiovascular:  Negative for chest pain, leg swelling and palpitations.  Gastrointestinal:  Positive for diarrhea and nausea. Negative for abdominal pain, constipation and vomiting.  Genitourinary:  Positive for bladder incontinence. Negative for difficulty urinating, dysuria, frequency, hematuria and nocturia.        +  urinary urgency  Musculoskeletal:  Positive for back pain (7/10 severity). Negative for arthralgias, flank pain, myalgias and neck pain.  Skin:  Negative for itching and rash.  Neurological:  Positive for headaches. Negative for dizziness and numbness.  Hematological:  Does not bruise/bleed easily.  Psychiatric/Behavioral:  Positive for depression. Negative for sleep disturbance and suicidal ideas. The patient is nervous/anxious.   All other systems reviewed and are negative.    VITALS:   There were no vitals taken for this visit.  Wt Readings from Last 3 Encounters:  08/30/22 150 lb 4.8 oz (68.2 kg)  08/10/22 151 lb 6.4 oz (68.7 kg)  07/20/22 150 lb 12.8 oz (68.4 kg)    There is no height or weight on file to calculate BMI.  Performance status (ECOG): 1 - Symptomatic but completely ambulatory  PHYSICAL EXAM:   Physical Exam Vitals and nursing note reviewed. Exam conducted with a chaperone present.  Constitutional:       Appearance: Normal appearance.  Cardiovascular:     Rate and Rhythm: Normal rate and regular rhythm.     Pulses: Normal pulses.     Heart sounds: Normal heart sounds.  Pulmonary:     Effort: Pulmonary effort is normal.     Breath sounds: Normal breath sounds.  Abdominal:     Palpations: Abdomen is soft. There is no hepatomegaly, splenomegaly or mass.     Tenderness: There is no abdominal tenderness.  Musculoskeletal:     Right lower leg: No edema.     Left lower leg: No edema.  Lymphadenopathy:     Cervical: No cervical adenopathy.     Right cervical: No superficial, deep or posterior cervical adenopathy.    Left cervical: No superficial, deep or posterior cervical adenopathy.     Upper Body:     Right upper body: No supraclavicular or axillary adenopathy.     Left upper body: No supraclavicular or axillary adenopathy.  Neurological:     General: No focal deficit present.     Mental Status: She is alert and oriented to person, place, and time.  Psychiatric:        Mood and Affect: Mood normal.        Behavior: Behavior normal.     LABS:      Latest Ref Rng & Units 08/30/2022   10:18 AM 08/10/2022    9:56 AM 07/20/2022    8:54 AM  CBC  WBC 4.0 - 10.5 K/uL 5.4  4.7  7.9   Hemoglobin 12.0 - 15.0 g/dL 16.1  09.6  04.5   Hematocrit 36.0 - 46.0 % 36.2  37.4  41.6   Platelets 150 - 400 K/uL 279  162  245       Latest Ref Rng & Units 08/30/2022   10:18 AM 08/10/2022    9:56 AM 07/20/2022    8:54 AM  CMP  Glucose 70 - 99 mg/dL 409  811  914   BUN 6 - 20 mg/dL 5  6  8    Creatinine 0.44 - 1.00 mg/dL 7.82  9.56  2.13   Sodium 135 - 145 mmol/L 130  134  135   Potassium 3.5 - 5.1 mmol/L 3.3  3.6  3.2   Chloride 98 - 111 mmol/L 93  98  97   CO2 22 - 32 mmol/L 24  27  28    Calcium 8.9 - 10.3 mg/dL 8.9  8.5  9.0   Total Protein 6.5 - 8.1 g/dL 7.2  6.8  6.8   Total Bilirubin 0.3 - 1.2 mg/dL 0.7  0.7  0.6   Alkaline Phos 38 - 126 U/L 44  45  44   AST 15 - 41 U/L 67  27  18   ALT  0 - 44 U/L 31  19  16       No results found for: "CEA1", "CEA" / No results found for: "CEA1", "CEA" No results found for: "PSA1" No results found for: "BMW413" No results found for: "CAN125"  No results found for: "TOTALPROTELP", "ALBUMINELP", "A1GS", "A2GS", "BETS", "BETA2SER", "GAMS", "MSPIKE", "SPEI" Lab Results  Component Value Date   TIBC 362 01/17/2018   FERRITIN 136 01/17/2018   IRONPCTSAT 40 (H) 01/17/2018   Lab Results  Component Value Date   LDH 146 05/01/2019   LDH 168 11/28/2017   LDH 185 08/24/2017     STUDIES:   No results found.

## 2022-08-30 ENCOUNTER — Inpatient Hospital Stay (HOSPITAL_BASED_OUTPATIENT_CLINIC_OR_DEPARTMENT_OTHER): Payer: Medicaid Other | Admitting: Hematology

## 2022-08-30 ENCOUNTER — Inpatient Hospital Stay: Payer: Medicaid Other

## 2022-08-30 ENCOUNTER — Other Ambulatory Visit: Payer: Self-pay | Admitting: *Deleted

## 2022-08-30 VITALS — BP 114/67 | HR 90 | Temp 97.3°F | Resp 18

## 2022-08-30 DIAGNOSIS — C3491 Malignant neoplasm of unspecified part of right bronchus or lung: Secondary | ICD-10-CM | POA: Diagnosis not present

## 2022-08-30 DIAGNOSIS — Z5112 Encounter for antineoplastic immunotherapy: Secondary | ICD-10-CM | POA: Diagnosis not present

## 2022-08-30 LAB — CBC WITH DIFFERENTIAL/PLATELET
Abs Immature Granulocytes: 0.03 10*3/uL (ref 0.00–0.07)
Basophils Absolute: 0 10*3/uL (ref 0.0–0.1)
Basophils Relative: 0 %
Eosinophils Absolute: 0 10*3/uL (ref 0.0–0.5)
Eosinophils Relative: 0 %
HCT: 36.2 % (ref 36.0–46.0)
Hemoglobin: 12.4 g/dL (ref 12.0–15.0)
Immature Granulocytes: 1 %
Lymphocytes Relative: 24 %
Lymphs Abs: 1.3 10*3/uL (ref 0.7–4.0)
MCH: 35 pg — ABNORMAL HIGH (ref 26.0–34.0)
MCHC: 34.3 g/dL (ref 30.0–36.0)
MCV: 102.3 fL — ABNORMAL HIGH (ref 80.0–100.0)
Monocytes Absolute: 0.7 10*3/uL (ref 0.1–1.0)
Monocytes Relative: 12 %
Neutro Abs: 3.4 10*3/uL (ref 1.7–7.7)
Neutrophils Relative %: 63 %
Platelets: 279 10*3/uL (ref 150–400)
RBC: 3.54 MIL/uL — ABNORMAL LOW (ref 3.87–5.11)
RDW: 15.8 % — ABNORMAL HIGH (ref 11.5–15.5)
WBC: 5.4 10*3/uL (ref 4.0–10.5)
nRBC: 0 % (ref 0.0–0.2)

## 2022-08-30 LAB — COMPREHENSIVE METABOLIC PANEL
ALT: 31 U/L (ref 0–44)
AST: 67 U/L — ABNORMAL HIGH (ref 15–41)
Albumin: 3.8 g/dL (ref 3.5–5.0)
Alkaline Phosphatase: 44 U/L (ref 38–126)
Anion gap: 13 (ref 5–15)
BUN: <5 mg/dL — ABNORMAL LOW (ref 6–20)
CO2: 24 mmol/L (ref 22–32)
Calcium: 8.9 mg/dL (ref 8.9–10.3)
Chloride: 93 mmol/L — ABNORMAL LOW (ref 98–111)
Creatinine, Ser: 0.5 mg/dL (ref 0.44–1.00)
GFR, Estimated: >60 mL/min (ref >60)
Glucose, Bld: 108 mg/dL — ABNORMAL HIGH (ref 70–99)
Potassium: 3.3 mmol/L — ABNORMAL LOW (ref 3.5–5.1)
Sodium: 130 mmol/L — ABNORMAL LOW (ref 135–145)
Total Bilirubin: 0.7 mg/dL (ref 0.3–1.2)
Total Protein: 7.2 g/dL (ref 6.5–8.1)

## 2022-08-30 LAB — MAGNESIUM: Magnesium: 1.3 mg/dL — ABNORMAL LOW (ref 1.7–2.4)

## 2022-08-30 MED ORDER — SODIUM CHLORIDE 0.9 % IV SOLN
200.0000 mg | Freq: Once | INTRAVENOUS | Status: AC
  Administered 2022-08-30: 200 mg via INTRAVENOUS
  Filled 2022-08-30: qty 8

## 2022-08-30 MED ORDER — SODIUM CHLORIDE 0.9% FLUSH
10.0000 mL | INTRAVENOUS | Status: DC | PRN
  Administered 2022-08-30 (×2): 10 mL

## 2022-08-30 MED ORDER — HEPARIN SOD (PORK) LOCK FLUSH 100 UNIT/ML IV SOLN
500.0000 [IU] | Freq: Once | INTRAVENOUS | Status: AC | PRN
  Administered 2022-08-30: 500 [IU]

## 2022-08-30 MED ORDER — SODIUM CHLORIDE 0.9 % IV SOLN: Freq: Once | INTRAVENOUS | Status: AC

## 2022-08-30 MED ORDER — TRAMADOL HCL 100 MG PO TABS: 100.0000 mg | ORAL_TABLET | Freq: Every day | ORAL | 0 refills | Status: DC | PRN

## 2022-08-30 MED ORDER — SODIUM CHLORIDE 0.9 % IV SOLN
544.0000 mg | Freq: Once | INTRAVENOUS | Status: AC
  Administered 2022-08-30: 540 mg via INTRAVENOUS
  Filled 2022-08-30: qty 54

## 2022-08-30 MED ORDER — DIPHENOXYLATE-ATROPINE 2.5-0.025 MG PO TABS: 2.0000 | ORAL_TABLET | Freq: Four times a day (QID) | ORAL | 0 refills | Status: DC | PRN

## 2022-08-30 MED ORDER — MAGNESIUM SULFATE 4 GM/100ML IV SOLN
4.0000 g | Freq: Once | INTRAVENOUS | Status: AC
  Administered 2022-08-30: 4 g via INTRAVENOUS
  Filled 2022-08-30: qty 100

## 2022-08-30 MED ORDER — CETIRIZINE HCL 10 MG/ML IV SOLN: 10.0000 mg | Freq: Once | INTRAVENOUS | Status: DC

## 2022-08-30 MED ORDER — METHYLPREDNISOLONE SODIUM SUCC 125 MG IJ SOLR
125.0000 mg | Freq: Once | INTRAMUSCULAR | Status: AC
  Administered 2022-08-30: 125 mg via INTRAVENOUS
  Filled 2022-08-30: qty 2

## 2022-08-30 MED ORDER — LORAZEPAM 2 MG/ML IJ SOLN
0.5000 mg | Freq: Once | INTRAMUSCULAR | Status: AC
  Administered 2022-08-30: 0.5 mg via INTRAVENOUS
  Filled 2022-08-30: qty 1

## 2022-08-30 MED ORDER — SODIUM CHLORIDE 0.9% FLUSH
10.0000 mL | Freq: Once | INTRAVENOUS | Status: AC
  Administered 2022-08-30: 10 mL via INTRAVENOUS

## 2022-08-30 MED ORDER — MAGNESIUM OXIDE -MG SUPPLEMENT 400 (240 MG) MG PO TABS: 400.0000 mg | ORAL_TABLET | Freq: Every day | ORAL | 3 refills | Status: DC

## 2022-08-30 MED ORDER — PALONOSETRON HCL INJECTION 0.25 MG/5ML
0.2500 mg | Freq: Once | INTRAVENOUS | Status: AC
  Administered 2022-08-30: 0.25 mg via INTRAVENOUS
  Filled 2022-08-30: qty 5

## 2022-08-30 MED ORDER — MAGNESIUM OXIDE -MG SUPPLEMENT 400 (240 MG) MG PO TABS: 400.0000 mg | ORAL_TABLET | Freq: Two times a day (BID) | ORAL | 4 refills | Status: DC

## 2022-08-30 MED ORDER — SODIUM CHLORIDE 0.9 % IV SOLN
150.0000 mg | Freq: Once | INTRAVENOUS | Status: AC
  Administered 2022-08-30: 150 mg via INTRAVENOUS
  Filled 2022-08-30: qty 5

## 2022-08-30 MED ORDER — FAMOTIDINE IN NACL 20-0.9 MG/50ML-% IV SOLN
20.0000 mg | Freq: Once | INTRAVENOUS | Status: AC
  Administered 2022-08-30: 20 mg via INTRAVENOUS
  Filled 2022-08-30: qty 50

## 2022-08-30 MED ORDER — SODIUM CHLORIDE 0.9 % IV SOLN
400.0000 mg/m2 | Freq: Once | INTRAVENOUS | Status: AC
  Administered 2022-08-30: 700 mg via INTRAVENOUS
  Filled 2022-08-30: qty 20

## 2022-08-30 MED ORDER — SODIUM CHLORIDE 0.9 % IV SOLN
10.0000 mg | Freq: Once | INTRAVENOUS | Status: AC
  Administered 2022-08-30: 10 mg via INTRAVENOUS
  Filled 2022-08-30: qty 1

## 2022-08-30 MED ORDER — IPRATROPIUM-ALBUTEROL 0.5-2.5 (3) MG/3ML IN SOLN
3.0000 mL | Freq: Once | RESPIRATORY_TRACT | Status: AC
  Administered 2022-08-30: 3 mL via RESPIRATORY_TRACT
  Filled 2022-08-30: qty 3

## 2022-08-30 MED ORDER — DIPHENHYDRAMINE HCL 50 MG/ML IJ SOLN
50.0000 mg | Freq: Once | INTRAMUSCULAR | Status: AC
  Administered 2022-08-30: 50 mg via INTRAVENOUS
  Filled 2022-08-30: qty 1

## 2022-08-30 NOTE — Progress Notes (Signed)
Labs reviewed with MD today at office visit. Ok to treat. MD added extra pre-medications today. Will give prior to Carbo per MD.  Magnesium and potassium noted. Magnesium and potassium will be given per orders.   1449-patient stated her hands were itching, stated she is nauseated. Carboplatin stopped which was almost finished. Vitals done and stable and MD notified. MD in room and orders to give 0.5mg  of Ativan IV. Per MD, no need to restart Carbo as this is her last one and there was little volume left.   Ativan given and will recheck on patient in 15 min per MD.   Patient resting, states nausea is better. Will observe for 30 more min per MD and will discharge if stable.   Hypersensitivity Reaction note  Date of event: 08/30/22 Time of event: 1449 Generic name of drug involved: Carboplatin Name of provider notified of the hypersensitivity reaction: Dr. Ellin Saba Was agent that likely caused hypersensitivity reaction added to Allergies List within EMR? Yes Chain of events including reaction signs/symptoms, treatment administered, and outcome (e.g., drug resumed; drug discontinued; sent to Emergency Department; etc.) not resumed per MD.   Rachel Gross, Rachel Decree, RN 08/30/2022 4:37 PM

## 2022-08-30 NOTE — Progress Notes (Signed)
Patients port flushed without difficulty.  Good blood return noted with no bruising or swelling noted at site.  Stable during access and blood draw.  Patient to remain accessed for treatment. 

## 2022-08-30 NOTE — Progress Notes (Signed)
Patient has been examined by Dr. Katragadda. Vital signs and labs have been reviewed by MD - ANC, Creatinine, LFTs, hemoglobin, and platelets are within treatment parameters per M.D. - pt may proceed with treatment.  Primary RN and pharmacy notified.  

## 2022-08-30 NOTE — Progress Notes (Signed)
Duoneb x 1 nebulizer prior to Carboplatin  DC cetirizine IV and give diphenhydramine 50 mg iv x 1 as premed to carboplatin.  MD using creatinine 0.6 in calculation of carboplatin dose - continue current dose 540 mg   V.O. Dr Carilyn Goodpasture, PharmD

## 2022-08-30 NOTE — Patient Instructions (Signed)
[facility]  Discharge Instructions: Thank you for choosing [facility] to provide your oncology and hematology care.  If you have a lab appointment with the Cancer Center - please note that after April 8th, 2024, all labs will be drawn in the cancer center.  You do not have to check in or register with the main entrance as you have in the past but will complete your check-in in the cancer center.  Wear comfortable clothing and clothing appropriate for easy access to any Portacath or PICC line.   We strive to give you quality time with your provider. You may need to reschedule your appointment if you arrive late (15 or more minutes).  Arriving late affects you and other patients whose appointments are after yours.  Also, if you miss three or more appointments without notifying the office, you may be dismissed from the clinic at the provider's discretion.      For prescription refill requests, have your pharmacy contact our office and allow 72 hours for refills to be completed.    Today you received the following chemotherapy and/or immunotherapy agents, key    To help prevent nausea and vomiting after your treatment, we encourage you to take your nausea medication as directed.  BELOW ARE SYMPTOMS THAT SHOULD BE REPORTED IMMEDIATELY: *FEVER GREATER THAN 100.4 F (38 C) OR HIGHER *CHILLS OR SWEATING *NAUSEA AND VOMITING THAT IS NOT CONTROLLED WITH YOUR NAUSEA MEDICATION *UNUSUAL SHORTNESS OF BREATH *UNUSUAL BRUISING OR BLEEDING *URINARY PROBLEMS (pain or burning when urinating, or frequent urination) *BOWEL PROBLEMS (unusual diarrhea, constipation, pain near the anus) TENDERNESS IN MOUTH AND THROAT WITH OR WITHOUT PRESENCE OF ULCERS (sore throat, sores in mouth, or a toothache) UNUSUAL RASH, SWELLING OR PAIN  UNUSUAL VAGINAL DISCHARGE OR ITCHING   Items with * indicate a potential emergency and should be followed up as soon as possible or go to the  Emergency Department if any problems should occur.  Please show the CHEMOTHERAPY ALERT CARD or IMMUNOTHERAPY ALERT CARD at check-in to the Emergency Department and triage nurse.  Should you have questions after your visit or need to cancel or reschedule your appointment, please contact [facility] [phone]  and follow the prompts.  Office hours are 8:00 a.m. to 4:30 p.m. Monday - Friday. Please note that voicemails left after 4:00 p.m. may not be returned until the following business day.  We are closed weekends and major holidays. You have access to a nurse at all times for urgent questions. Please call the main number to the clinic [phone] and follow the prompts.  For any non-urgent questions, you may also contact your provider using MyChart. We now offer e-Visits for anyone 22 and older to request care online for non-urgent symptoms. For details visit [email or web address].   Also download the MyChart app! Go to the app store, search "MyChart", open the app, select [place], and log in with your MyChart username and password.

## 2022-08-30 NOTE — Patient Instructions (Signed)

## 2022-08-31 ENCOUNTER — Encounter: Payer: Self-pay | Admitting: Hematology

## 2022-09-01 ENCOUNTER — Encounter (HOSPITAL_COMMUNITY): Payer: Self-pay

## 2022-09-11 ENCOUNTER — Other Ambulatory Visit: Payer: Self-pay | Admitting: *Deleted

## 2022-09-11 ENCOUNTER — Telehealth: Payer: Self-pay

## 2022-09-11 NOTE — Telephone Encounter (Signed)
Notified Patient of prior authorization approval for Tramadol 100 mg Tablets and that medication would be called to the Pharmacy of her choice on 09/12/22. Medication is approved through 12/12/2022. No other needs or concerns voiced at this time.

## 2022-09-12 ENCOUNTER — Other Ambulatory Visit: Payer: Self-pay | Admitting: *Deleted

## 2022-09-12 MED ORDER — TRAMADOL HCL 100 MG PO TABS: 100.0000 mg | ORAL_TABLET | Freq: Every day | ORAL | 0 refills | Status: DC | PRN

## 2022-09-14 ENCOUNTER — Encounter (HOSPITAL_COMMUNITY): Admission: RE | Admit: 2022-09-14 | Payer: Medicaid Other | Source: Ambulatory Visit

## 2022-09-17 ENCOUNTER — Other Ambulatory Visit: Payer: Self-pay

## 2022-09-19 ENCOUNTER — Other Ambulatory Visit: Payer: Self-pay

## 2022-09-21 ENCOUNTER — Inpatient Hospital Stay: Payer: Medicaid Other | Attending: Hematology

## 2022-09-21 ENCOUNTER — Inpatient Hospital Stay: Payer: Medicaid Other | Admitting: Hematology

## 2022-09-21 ENCOUNTER — Inpatient Hospital Stay: Payer: Medicaid Other

## 2022-09-22 ENCOUNTER — Other Ambulatory Visit: Payer: Self-pay

## 2022-09-30 ENCOUNTER — Other Ambulatory Visit: Payer: Self-pay

## 2022-10-05 ENCOUNTER — Encounter (HOSPITAL_COMMUNITY)
Admission: RE | Admit: 2022-10-05 | Discharge: 2022-10-05 | Disposition: A | Discharge status: Home / Self Care | Payer: Medicaid Other | Source: Ambulatory Visit | Attending: Hematology | Admitting: Hematology

## 2022-10-05 ENCOUNTER — Encounter (HOSPITAL_COMMUNITY): Payer: Self-pay

## 2022-10-05 DIAGNOSIS — C3491 Malignant neoplasm of unspecified part of right bronchus or lung: Secondary | ICD-10-CM | POA: Insufficient documentation

## 2022-10-06 ENCOUNTER — Other Ambulatory Visit: Payer: Self-pay

## 2022-10-08 ENCOUNTER — Other Ambulatory Visit: Payer: Self-pay

## 2022-10-11 ENCOUNTER — Other Ambulatory Visit: Payer: Self-pay

## 2022-10-12 ENCOUNTER — Inpatient Hospital Stay: Payer: Medicaid Other | Admitting: Hematology

## 2022-10-12 ENCOUNTER — Inpatient Hospital Stay: Payer: Medicaid Other

## 2022-10-12 ENCOUNTER — Other Ambulatory Visit (HOSPITAL_COMMUNITY): Payer: Medicaid Other

## 2022-10-17 ENCOUNTER — Encounter: Payer: Self-pay | Admitting: Hematology

## 2022-10-19 ENCOUNTER — Inpatient Hospital Stay: Payer: Medicaid Other

## 2022-10-19 ENCOUNTER — Encounter (HOSPITAL_COMMUNITY): Payer: Medicaid Other

## 2022-11-01 ENCOUNTER — Inpatient Hospital Stay: Payer: Medicaid Other | Admitting: Hematology

## 2022-11-01 ENCOUNTER — Inpatient Hospital Stay: Payer: Medicaid Other

## 2022-11-09 ENCOUNTER — Inpatient Hospital Stay (HOSPITAL_BASED_OUTPATIENT_CLINIC_OR_DEPARTMENT_OTHER): Payer: Medicaid Other | Admitting: Hematology

## 2022-11-09 ENCOUNTER — Inpatient Hospital Stay: Payer: Medicaid Other | Admitting: Licensed Clinical Social Worker

## 2022-11-09 ENCOUNTER — Inpatient Hospital Stay: Payer: Medicaid Other

## 2022-11-09 ENCOUNTER — Inpatient Hospital Stay: Payer: Medicaid Other | Attending: Hematology

## 2022-11-09 DIAGNOSIS — M546 Pain in thoracic spine: Secondary | ICD-10-CM | POA: Insufficient documentation

## 2022-11-09 DIAGNOSIS — Z452 Encounter for adjustment and management of vascular access device: Secondary | ICD-10-CM | POA: Insufficient documentation

## 2022-11-09 DIAGNOSIS — Z23 Encounter for immunization: Secondary | ICD-10-CM | POA: Insufficient documentation

## 2022-11-09 DIAGNOSIS — C3491 Malignant neoplasm of unspecified part of right bronchus or lung: Secondary | ICD-10-CM

## 2022-11-09 DIAGNOSIS — C7951 Secondary malignant neoplasm of bone: Secondary | ICD-10-CM | POA: Insufficient documentation

## 2022-11-09 DIAGNOSIS — C3431 Malignant neoplasm of lower lobe, right bronchus or lung: Secondary | ICD-10-CM | POA: Diagnosis present

## 2022-11-09 LAB — COMPREHENSIVE METABOLIC PANEL
ALT: 12 U/L (ref 0–44)
AST: 22 U/L (ref 15–41)
Albumin: 3.5 g/dL (ref 3.5–5.0)
Alkaline Phosphatase: 37 U/L — ABNORMAL LOW (ref 38–126)
Anion gap: 12 (ref 5–15)
BUN: 7 mg/dL (ref 6–20)
CO2: 25 mmol/L (ref 22–32)
Calcium: 8.7 mg/dL — ABNORMAL LOW (ref 8.9–10.3)
Chloride: 95 mmol/L — ABNORMAL LOW (ref 98–111)
Creatinine, Ser: 0.46 mg/dL (ref 0.44–1.00)
GFR, Estimated: >60 mL/min (ref >60)
Glucose, Bld: 124 mg/dL — ABNORMAL HIGH (ref 70–99)
Potassium: 3.3 mmol/L — ABNORMAL LOW (ref 3.5–5.1)
Sodium: 132 mmol/L — ABNORMAL LOW (ref 135–145)
Total Bilirubin: 0.4 mg/dL (ref 0.3–1.2)
Total Protein: 6.7 g/dL (ref 6.5–8.1)

## 2022-11-09 LAB — CBC WITH DIFFERENTIAL/PLATELET
Abs Immature Granulocytes: 0.01 10*3/uL (ref 0.00–0.07)
Basophils Absolute: 0 10*3/uL (ref 0.0–0.1)
Basophils Relative: 0 %
Eosinophils Absolute: 0 10*3/uL (ref 0.0–0.5)
Eosinophils Relative: 1 %
HCT: 39.5 % (ref 36.0–46.0)
Hemoglobin: 13.3 g/dL (ref 12.0–15.0)
Immature Granulocytes: 0 %
Lymphocytes Relative: 18 %
Lymphs Abs: 1 10*3/uL (ref 0.7–4.0)
MCH: 34.2 pg — ABNORMAL HIGH (ref 26.0–34.0)
MCHC: 33.7 g/dL (ref 30.0–36.0)
MCV: 101.5 fL — ABNORMAL HIGH (ref 80.0–100.0)
Monocytes Absolute: 0.5 10*3/uL (ref 0.1–1.0)
Monocytes Relative: 10 %
Neutro Abs: 3.8 10*3/uL (ref 1.7–7.7)
Neutrophils Relative %: 71 %
Platelets: 134 10*3/uL — ABNORMAL LOW (ref 150–400)
RBC: 3.89 MIL/uL (ref 3.87–5.11)
RDW: 13.1 % (ref 11.5–15.5)
WBC: 5.4 10*3/uL (ref 4.0–10.5)
nRBC: 0 % (ref 0.0–0.2)

## 2022-11-09 LAB — MAGNESIUM: Magnesium: 1.7 mg/dL (ref 1.7–2.4)

## 2022-11-09 MED ORDER — INFLUENZA VIRUS VACC SPLIT PF (FLUZONE) 0.5 ML IM SUSY
0.5000 mL | PREFILLED_SYRINGE | Freq: Once | INTRAMUSCULAR | Status: AC
  Administered 2022-11-09: 0.5 mL via INTRAMUSCULAR
  Filled 2022-11-09: qty 0.5

## 2022-11-09 MED ORDER — HEPARIN SOD (PORK) LOCK FLUSH 100 UNIT/ML IV SOLN
500.0000 [IU] | Freq: Once | INTRAVENOUS | Status: AC
  Administered 2022-11-09: 500 [IU] via INTRAVENOUS

## 2022-11-09 MED ORDER — SODIUM CHLORIDE 0.9% FLUSH
10.0000 mL | INTRAVENOUS | Status: DC | PRN
  Administered 2022-11-09: 10 mL via INTRAVENOUS

## 2022-11-09 NOTE — Progress Notes (Signed)
Rachel Gross presented for Portacath access and flush. Proper placement of portacath confirmed by CXR. Portacath located left chest wall accessed with  H 20 needle. Good blood return present. Portacath flushed with 20ml NS and 500U/56ml Heparin and needle removed intact. Procedure without incident. Patient tolerated procedure well.

## 2022-11-09 NOTE — Patient Instructions (Addendum)
[facility] at [facility] Discharge Instructions   You were seen and examined today by Dr. Ellin Saba.  He reviewed the results of your lab work which are normal/stable.   We will need to get a PET scan prior to giving you any further treatment.   Return as scheduled.    Thank you for choosing [facility] at [facility] to provide your oncology and hematology care.  To afford each patient quality time with our provider, please arrive at least 15 minutes before your scheduled appointment time.   If you have a lab appointment with the Cancer Center please come in thru the Main Entrance and check in at the main information desk.  You need to re-schedule your appointment should you arrive 10 or more minutes late.  We strive to give you quality time with our providers, and arriving late affects you and other patients whose appointments are after yours.  Also, if you no show three or more times for appointments you may be dismissed from the clinic at the providers discretion.     Again, thank you for choosing [facility].  Our hope is that these requests will decrease the amount of time that you wait before being seen by our physicians.       _____________________________________________________________  Should you have questions after your visit to [facility], please contact our office at [phone] and follow the prompts.  Our office hours are 8:00 a.m. and 4:30 p.m. Monday - Friday.  Please note that voicemails left after 4:00 p.m. may not be returned until the following business day.  We are closed weekends and major holidays.  You do have access to a nurse 24-7, just call the main number to the clinic [phone] and do not press any options, hold on the line and a nurse will answer the phone.    For prescription refill requests, have your pharmacy contact our office and allow 72 hours.    Due to Covid, you will need  to wear a mask upon entering the hospital. If you do not have a mask, a mask will be given to you at the Main Entrance upon arrival. For doctor visits, patients may have 1 support person age 85 or older with them. For treatment visits, patients can not have anyone with them due to social distancing guidelines and our immunocompromised population.

## 2022-11-09 NOTE — Progress Notes (Signed)
[facility] [address], [place] [address]    Clinic Day:  11/09/22    Referring physician: Benita Stabile, MD  Patient Care Team: Benita Stabile, MD as PCP - General (Internal Medicine) Jena Gauss Gerrit Friends, MD as Consulting Physician (Gastroenterology) Doreatha Massed, MD as Consulting Physician (Medical Oncology)   ASSESSMENT & PLAN:   Assessment: 1.  Metastatic lung adenocarcinoma: -PET scan on 05/13/2019 showed right lower lobe nodule concerning for bronchogenic carcinoma.  Cirrhotic liver. -Right lower lobectomy and lymph node excision on 06/19/2019. -Pathology showed 1.7 cm right lower lobe adenocarcinoma, unifocal, lymphovascular invasion present.  Margins negative.  2/14 lymph nodes positive (level 7 and 10R).  PT1CPN2. -PD-L1 30%, K-ras G12A, MSI stable.  EGFR mutation not identified. -4 cycles of adjuvant carboplatin and pemetrexed from 09/09/2019 through 11/11/2019. - CT chest on 12/29/2021: Lytic metastatic lesion in the posterior left fifth rib.  Interval enlargement of cavitary nodule of the dependent right upper lobe.  Numerous new clustered nodules of varying sizes. - PET scan (01/19/2022): Cavitary nodule of concern in the right middle lobe measures 1.3 x 0.7 cm, SUV max of 1.8.  No other hypermetabolic or suspicious lung nodules.  Expansile lytic metastasis involving left fifth rib hypermetabolic measuring 2.6 x 1.9 cm with SUV 4.2. - Left rib biopsy (02/06/2022): Metastatic moderately to poorly differentiated adenocarcinoma - NGS by [facility]: PD-L1 (22 C3)-TPS 5%.  TMB-high.  K-ras G12 A pathogenic variant.  T p53 pathogenic variant.  Other targetable mutations negative.  MSI-stable. - XRT to the left rib lesion completed - SBRT to the T5 lesion from 06/12/2022 through 06/16/2022 - Cycle 1 of carboplatin, pemetrexed and pembrolizumab on 06/29/2022    Plan: 1.  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 - She was lost to follow-up.  She reports that she might have lost her insurance.  She apparently inherited 40k from her father which disqualifies her from having Medicaid. - She has lost 10 pounds since last visit as she is eating less due to all her top teeth pulled on 10/09/2022. - Reviewed labs today: Normal LFTs and CBC. - She has missed PET scan multiple times.  I have told her that without scans, I am not comfortable continuing chemotherapy as we do not know if it is helping her or not. - We have made a referral to our Child psychotherapist.  She was instructed to apply for Medicare as she is on disability since 2017 which makes her qualify for it. - During cycle 4, she had reaction to carboplatin.  If she has good response, we will discontinue carboplatin and start her on maintenance pemetrexed and pembrolizumab.   2.  Left mid back pain: - Continue tramadol as needed.   3.  Hypomagnesemia: - Continue magnesium twice daily.  Magnesium level is normal.   No orders of the defined types were placed in this encounter.      [name],acting as a Neurosurgeon for Doreatha Massed, MD.,have documented all relevant documentation on the behalf of Doreatha Massed, MD,as directed by  Doreatha Massed, MD while in the presence of Doreatha Massed, MD.  I, Doreatha Massed MD, have reviewed the above documentation for accuracy and completeness, and I agree with the above.      Doreatha Massed, MD   10/10/20244:21 PM  CHIEF COMPLAINT:   Diagnosis: metastatic right lung cancer    Cancer Staging  Non-small cell cancer of right lung [facility]) Staging form: Lung, AJCC 8th Edition - Clinical stage from 02/13/2022: Stage IV (cT1c, cN2, pM1) - Signed by Doreatha Massed, MD on 02/13/2022    Prior Therapy: 1. Right lower lobectomy on 06/19/2019. 2. Carboplatin and pemetrexed x 4 cycles from 09/09/2019 to 11/11/2019 3. SBRT to left rib/chest  wall lesion, completed 03/22/22 4. SBRT to T5 lesion, 06/12/22 - 06/16/22  Current Therapy:  carboplatin, pemetrexed and pembrolizumab    HISTORY OF PRESENT ILLNESS:   Oncology History  Adenocarcinoma of lung, stage 3, right (HCC)  07/02/2019 Initial Diagnosis   Adenocarcinoma of lung, stage 3, right (HCC)   07/18/2019 Genetic Testing   [facility]     07/23/2019 Genetic Testing   PD-L1     09/09/2019 - 11/11/2019 Chemotherapy   Patient is on Treatment Plan : LUNG NSCLC Pemetrexed (Alimta) / Carboplatin q21d x 4 cycles     Non-small cell cancer of right lung (HCC)  02/13/2022 Initial Diagnosis   Non-small cell cancer of right lung (HCC)   02/13/2022 Cancer Staging   Staging form: Lung, AJCC 8th Edition - Clinical stage from 02/13/2022: Stage IV (cT1c, cN2, pM1) - Signed by Doreatha Massed, MD on 02/13/2022 Histopathologic type: Adenocarcinoma, NOS Stage prefix: Initial diagnosis Histologic grade (G): G3 Histologic grading system: 4 grade system   06/29/2022 -  Chemotherapy   Patient is on Treatment Plan : LUNG Carboplatin (5) + Pemetrexed (500) + Pembrolizumab (200) D1 q21d Induction x 4 cycles / Maintenance Pemetrexed (500) + Pembrolizumab (200) D1 q21d        INTERVAL HISTORY:   Rachel Gross is a 57 y.o. Gross presenting to clinic today for follow up of metastatic right lung cancer. She was last seen by me on 08/30/22.  Today, she states that she is doing well overall. Her appetite level is at 10%. Her energy level is at 0%. She reports she has not been to appointments and imaging due to insurance issues. She had all of her upper teeth extracted on 10/09/22 and needs to get dentures, as she has had trouble chewing. She has lost 10 pounds since her last visit. She c/o low energy and decreased appetite.   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    Bipolar disorder (HCC)    Cancer (HCC)    right lung   COPD (chronic obstructive pulmonary disease) (HCC)    Depression    Dyspnea    Emphysema lung (HCC)    GERD (gastroesophageal reflux disease)    Hepatitis C    s/p treatment with Epclusa   History of HPV infection    History of pneumonia    Pneumonia    Smoker     Surgical History: Past Surgical History:  Procedure Laterality Date   BIOPSY  05/02/2018   Procedure: BIOPSY;  Surgeon: Corbin Ade, MD;  [facility];  Service: Endoscopy;;  gastric   CESAREAN SECTION     CHEST TUBE INSERTION Right 06/26/2019   Procedure: Right CHEST TUBE REPLACEMENT;  Surgeon: Loreli Slot, MD;  Location: [facility] OR;  Service: Thoracic;  Laterality: Right;   COLONOSCOPY WITH PROPOFOL N/A 03/03/2019   Procedure: COLONOSCOPY WITH PROPOFOL;  Surgeon: Corbin Ade, MD;  [facility];  Service:  Endoscopy;  Laterality: N/A;  9:15am   ESOPHAGOGASTRODUODENOSCOPY (EGD) WITH PROPOFOL N/A 05/02/2018   Procedure: ESOPHAGOGASTRODUODENOSCOPY (EGD) WITH PROPOFOL;  Surgeon: Corbin Ade, MD;  [facility];  Service: Endoscopy;  Laterality: N/A;  8:30am   EYE SURGERY Bilateral    cataract   HEMOSTASIS CLIP PLACEMENT  03/03/2019   Procedure: HEMOSTASIS CLIP PLACEMENT;  Surgeon: Corbin Ade, MD;  [facility];  Service: Endoscopy;;   INTERCOSTAL NERVE BLOCK Right 06/19/2019   Procedure: Intercostal Nerve Block;  Surgeon: Loreli Slot, MD;  Location: [facility] OR;  Service: Thoracic;  Laterality: Right;   IR US GUIDE VASC ACCESS RIGHT  03/13/2018   LOBECTOMY     LYMPH NODE DISSECTION Right 06/19/2019   Procedure: Lymph Node Dissection;  Surgeon: Loreli Slot, MD;  Location: [facility] OR;  Service: Thoracic;  Laterality: Right;   POLYPECTOMY  03/03/2019   Procedure: POLYPECTOMY;  Surgeon: Corbin Ade, MD;  [facility];  Service:  Endoscopy;;   PORTACATH PLACEMENT Left 09/08/2019   Procedure: INSERTION PORT-A-CATH LEFT CHEST (attached catheter in left subclavian);  Surgeon: Franky Macho, MD;  Location: [facility];  Service: General;  Laterality: Left;   TONSILLECTOMY     TOOTH EXTRACTION  01/31/2018   x 7   VIDEO BRONCHOSCOPY WITH INSERTION OF INTERBRONCHIAL VALVE (IBV) N/A 06/26/2019   Procedure: VIDEO BRONCHOSCOPY WITH INSERTION OF TWO INTERBRONCHIAL VALVE (IBV);  Surgeon: Loreli Slot, MD;  Location: [facility] OR;  Service: Thoracic;  Laterality: N/A;   VIDEO BRONCHOSCOPY WITH INSERTION OF INTERBRONCHIAL VALVE (IBV) N/A 08/08/2019   Procedure: VIDEO BRONCHOSCOPY WITH REMOVAL OF INTERBRONCHIAL VALVE (IBV);  Surgeon: Loreli Slot, MD;  Location: [facility] OR;  Service: Thoracic;  Laterality: N/A;    Social History: Social History   Socioeconomic History   Marital status: Single    Spouse name: Not on file   Number of children: Not on file   Years of education: Not on file   Highest education level: Not on file  Occupational History   Not on file  Tobacco Use   Smoking status: Former    Current packs/day: 0.00    Average packs/day: 0.5 packs/day for 38.0 years (19.0 ttl pk-yrs)    Types: Cigarettes    Start date: 06/18/1981    Quit date: 06/19/2019    Years since quitting: 3.3   Smokeless tobacco: Never  Vaping Use   Vaping status: Never Used  Substance and Sexual Activity   Alcohol use: Not Currently    Comment: Last use of alcohol 03/2016   Drug use: Not Currently    Types: 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   Sexual activity: Not Currently  Other Topics Concern   Not on file  Social History Narrative   Not on file   Social Determinants of Health   Financial Resource Strain: Medium Risk (12/10/2019)   Overall Financial Resource Strain (CARDIA)    Difficulty of Paying Living Expenses: Somewhat hard  Food Insecurity: Food Insecurity Present (12/10/2019)   Hunger Vital Sign    Worried About Running Out of  Food in the Last Year: Often true    Ran Out of Food in the Last Year: Often true  Transportation Needs: No Transportation Needs (12/10/2019)   PRAPARE - Administrator, Civil Service (Medical): No    Lack of Transportation (Non-Medical): No  Physical Activity: Sufficiently Active (12/10/2019)   Exercise Vital Sign    Days of Exercise per  Week: 3 days    Minutes of Exercise per Session: 150+ min  Stress: Stress Concern Present (12/10/2019)   [employer] of Occupational Health - Occupational Stress Questionnaire    Feeling of Stress : Very much  Social Connections: Socially Isolated (12/10/2019)   Social Connection and Isolation Panel [NHANES]    Frequency of Communication with Friends and Family: More than three times a week    Frequency of Social Gatherings with Friends and Family: Once a week    Attends Religious Services: Never    Database administrator or Organizations: No    Attends Banker Meetings: Never    Marital Status: Divorced  Catering manager Violence: Not At Risk (12/10/2019)   Humiliation, Afraid, Rape, and Kick questionnaire    Fear of Current or Ex-Partner: No    Emotionally Abused: No    Physically Abused: No    Sexually Abused: No    Family History: Family History  Problem Relation Age of Onset   Congenital heart disease Mother    Bipolar disorder Mother    Prostate cancer Brother    Alzheimer's disease Paternal Grandmother    Colon cancer Neg Hx     Current Medications:  Current Outpatient Medications:    albuterol (VENTOLIN HFA) 108 (90 Base) MCG/ACT inhaler, Inhale 2 puffs into the lungs every 4 (four) hours as needed for wheezing or shortness of breath., Disp: , Rfl:    chlorhexidine (PERIDEX) 0.12 % solution, SMARTSIG:By Mouth, Disp: , Rfl:    clindamycin (CLEOCIN) 300 MG capsule, Take 300 mg by mouth 3 (three) times daily., Disp: , Rfl:    dexamethasone (DECADRON) 2 MG tablet, Take 1 tablet (2 mg total) by mouth 2  (two) times daily., Disp: 60 tablet, Rfl: 0   diphenhydrAMINE (BENADRYL) 25 MG tablet, Take 25 mg by mouth at bedtime., Disp: , Rfl:    diphenoxylate-atropine (LOMOTIL) 2.5-0.025 MG tablet, Take 2 tablets by mouth 4 (four) times daily as needed for diarrhea or loose stools., Disp: 240 tablet, Rfl: 0   divalproex (DEPAKOTE) 250 MG DR tablet, Take 250 mg by mouth at bedtime. , Disp: , Rfl:    folic acid (FOLVITE) 1 MG tablet, Take 1 tablet (1 mg total) by mouth daily., Disp: 90 tablet, Rfl: 1   ipratropium-albuterol (DUONEB) 0.5-2.5 (3) MG/3ML SOLN, Take 3 mLs by nebulization every 6 (six) hours as needed., Disp: 360 mL, Rfl: 3   magnesium oxide (MAG-OX) 400 (240 Mg) MG tablet, Take 1 tablet (400 mg total) by mouth 2 (two) times daily., Disp: 120 tablet, Rfl: 4   pantoprazole (PROTONIX) 40 MG tablet, TAKE 1 TABLET BY MOUTH DAILY BEFORE BREAKFAST, Disp: 90 tablet, Rfl: 3   risperiDONE (RISPERDAL) 2 MG tablet, Take 2 mg by mouth at bedtime. , Disp: , Rfl:    scopolamine (TRANSDERM-SCOP) 1 MG/3DAYS, Place 1 patch (1.5 mg total) onto the skin every 3 (three) days., Disp: 10 patch, Rfl: 12   SYMBICORT 160-4.5 MCG/ACT inhaler, Inhale 2 puffs into the lungs 2 (two) times daily., Disp: 1 each, Rfl: 6   traMADol HCl 100 MG TABS, Take 100 mg by mouth daily as needed., Disp: 30 tablet, Rfl: 0   traZODone (DESYREL) 100 MG tablet, Take by mouth., Disp: , Rfl:    lidocaine-prilocaine (EMLA) cream, Apply a small amount to port a cath site and cover with plastic wrap 1 hour prior to chemotherapy appointments (Patient not taking: Reported on 11/09/2022), Disp: 30 g, Rfl: 3   ondansetron (  ZOFRAN ODT) 8 MG disintegrating tablet, Place 1 tablet under tongue every 8 hours as needed for nausea. You may begin using this medication on the third day after each chemotherapy treatment. (Patient not taking: Reported on 11/09/2022), Disp: 20 tablet, Rfl: 1   prochlorperazine (COMPAZINE) 10 MG tablet, Take 1 tablet (10 mg total) by  mouth every 6 (six) hours as needed for nausea or vomiting. (Patient not taking: Reported on 11/09/2022), Disp: 60 tablet, Rfl: 5   Allergies: Allergies  Allergen Reactions   Folic Acid Anaphylaxis    Not entirely clear if it is from folic acid. She is taking folic acid now and doesn't have the throat swelling anymore   Penicillins Anaphylaxis    Throat swelled Did it involve swelling of the face/tongue/throat, SOB, or low BP? Yes Did it involve sudden or severe rash/hives, skin peeling, or any reaction on the inside of your mouth or nose? No Did you need to seek medical attention at a hospital or doctor's office? No When did it last happen?      childhood allergy If all above answers are "NO", may proceed with cephalosporin use.    Amoxicillin     hallucinations Did it involve swelling of the face/tongue/throat, SOB, or low BP? No Did it involve sudden or severe rash/hives, skin peeling, or any reaction on the inside of your mouth or nose? No Did you need to seek medical attention at a hospital or doctor's office? Yes When did it last happen?      July 2019 If all above answers are "NO", may proceed with cephalosporin use.    Carboplatin Nausea Only, Other (See Comments) and Cough    Patient complaints of feeling hot and flushed. See progress note from 7/11. Patient able to complete infusion after additional medications given.  Fingers itch, nausea Second reaction on 08/30/2022; included itching of hands and nausea; see progress note from 08/30/2022   Fish Allergy Nausea Only   Other Swelling    Patient states that the sunrise brand folic acid made her feel like her throat was swelling.     REVIEW OF SYSTEMS:   Review of Systems  Constitutional:  Negative for chills, fatigue and fever.  HENT:   Negative for lump/mass, mouth sores, nosebleeds, sore throat and trouble swallowing.   Eyes:  Negative for eye problems.  Respiratory:  Positive for cough. Negative for shortness of  breath.   Cardiovascular:  Negative for chest pain, leg swelling and palpitations.  Gastrointestinal:  Positive for constipation and diarrhea. Negative for abdominal pain, nausea and vomiting.  Genitourinary:  Negative for bladder incontinence, difficulty urinating, dysuria, frequency, hematuria and nocturia.   Musculoskeletal:  Negative for arthralgias, back pain, flank pain, myalgias and neck pain.  Skin:  Negative for itching and rash.  Neurological:  Positive for headaches. Negative for dizziness and numbness.  Hematological:  Does not bruise/bleed easily.  Psychiatric/Behavioral:  Positive for sleep disturbance. Negative for depression and suicidal ideas. The patient is not nervous/anxious.   All other systems reviewed and are negative.    VITALS:   There were no vitals taken for this visit.  Wt Readings from Last 3 Encounters:  11/09/22 139 lb 12.8 oz (63.4 kg)  08/30/22 150 lb 4.8 oz (68.2 kg)  08/10/22 151 lb 6.4 oz (68.7 kg)    There is no height or weight on file to calculate BMI.  Performance status (ECOG): 1 - Symptomatic but completely ambulatory  PHYSICAL EXAM:   Physical Exam Vitals  and nursing note reviewed. Exam conducted with a chaperone present.  Constitutional:      Appearance: Normal appearance.  Cardiovascular:     Rate and Rhythm: Normal rate and regular rhythm.     Pulses: Normal pulses.     Heart sounds: Normal heart sounds.  Pulmonary:     Effort: Pulmonary effort is normal.     Breath sounds: Normal breath sounds.  Abdominal:     Palpations: Abdomen is soft. There is no hepatomegaly, splenomegaly or mass.     Tenderness: There is no abdominal tenderness.  Musculoskeletal:     Right lower leg: No edema.     Left lower leg: No edema.  Lymphadenopathy:     Cervical: No cervical adenopathy.     Right cervical: No superficial, deep or posterior cervical adenopathy.    Left cervical: No superficial, deep or posterior cervical adenopathy.     Upper  Body:     Right upper body: No supraclavicular or axillary adenopathy.     Left upper body: No supraclavicular or axillary adenopathy.  Neurological:     General: No focal deficit present.     Mental Status: She is alert and oriented to person, place, and time.  Psychiatric:        Mood and Affect: Mood normal.        Behavior: Behavior normal.     LABS:      Latest Ref Rng & Units 11/09/2022   11:57 AM 08/30/2022   10:18 AM 08/10/2022    9:56 AM  CBC  WBC 4.0 - 10.5 K/uL 5.4  5.4  4.7   Hemoglobin 12.0 - 15.0 g/dL 28.4  13.2  44.0   Hematocrit 36.0 - 46.0 % 39.5  36.2  37.4   Platelets 150 - 400 K/uL 134  279  162       Latest Ref Rng & Units 11/09/2022   11:57 AM 08/30/2022   10:18 AM 08/10/2022    9:56 AM  CMP  Glucose 70 - 99 mg/dL 102  725  366   BUN 6 - 20 mg/dL 7  <5  6   Creatinine 4.40 - 1.00 mg/dL 3.47  4.25  9.56   Sodium 135 - 145 mmol/L 132  130  134   Potassium 3.5 - 5.1 mmol/L 3.3  3.3  3.6   Chloride 98 - 111 mmol/L 95  93  98   CO2 22 - 32 mmol/L 25  24  27    Calcium 8.9 - 10.3 mg/dL 8.7  8.9  8.5   Total Protein 6.5 - 8.1 g/dL 6.7  7.2  6.8   Total Bilirubin 0.3 - 1.2 mg/dL 0.4  0.7  0.7   Alkaline Phos 38 - 126 U/L 37  44  45   AST 15 - 41 U/L 22  67  27   ALT 0 - 44 U/L 12  31  19       No results found for: "CEA1", "CEA" / No results found for: "CEA1", "CEA" No results found for: "PSA1" No results found for: "LOV564" No results found for: "CAN125"  No results found for: "TOTALPROTELP", "ALBUMINELP", "A1GS", "A2GS", "BETS", "BETA2SER", "GAMS", "MSPIKE", "SPEI" Lab Results  Component Value Date   TIBC 362 01/17/2018   FERRITIN 136 01/17/2018   IRONPCTSAT 40 (H) 01/17/2018   Lab Results  Component Value Date   LDH 146 05/01/2019   LDH 168 11/28/2017   LDH 185 08/24/2017     STUDIES:  No results found.

## 2022-11-09 NOTE — Progress Notes (Signed)
[facility] CSW Progress Note  Visual merchandiser met with patient to discuss concerns regarding insurance.  Pt currently has Medicaid.  According to pt she recently received a cash inheritance from her father after he passed.  This inheritance will take her over the limit to continue to qualify for Medicaid.  Per pt she has been receiving social security disability since 2017 due to her mental health diagnosis.  Pt provided for the telephone number for [facility] as pt should be eligible for Medicare if she has received SSD for over 2 years.  Pt also provided w/ contact information for the [place] consortium to assist with obtaining market place insurance if she for some reason does not qualify for Medicare.  CSW to remain available as appropriate throughout duration of treatment.      Rachel Moulds, LCSW Clinical Social Worker [facility]    Patient is participating in a Managed Medicaid Plan:  Yes

## 2022-11-09 NOTE — Progress Notes (Signed)
Rachel Gross presents today for injection per the provider's orders.  Flu shot administration without incident; injection site WNL; see MAR for injection details.  Patient tolerated procedure well and without incident.  No questions or complaints noted at this time. Patient discharged ambulatory and in stable condition.

## 2022-11-17 ENCOUNTER — Other Ambulatory Visit: Payer: Self-pay | Admitting: Hematology

## 2022-11-20 ENCOUNTER — Encounter: Payer: Self-pay | Admitting: Hematology

## 2022-11-30 ENCOUNTER — Inpatient Hospital Stay: Payer: Medicaid Other

## 2022-12-04 ENCOUNTER — Other Ambulatory Visit: Payer: Self-pay | Admitting: Hematology

## 2022-12-05 ENCOUNTER — Encounter: Payer: Self-pay | Admitting: Hematology

## 2022-12-20 ENCOUNTER — Other Ambulatory Visit: Payer: Self-pay

## 2023-01-04 ENCOUNTER — Encounter (HOSPITAL_COMMUNITY)
Admission: RE | Admit: 2023-01-04 | Discharge: 2023-01-04 | Disposition: A | Discharge status: Home / Self Care | Payer: Medicaid Other | Source: Ambulatory Visit | Attending: Hematology | Admitting: Hematology

## 2023-01-04 DIAGNOSIS — C3491 Malignant neoplasm of unspecified part of right bronchus or lung: Secondary | ICD-10-CM | POA: Insufficient documentation

## 2023-01-04 MED ORDER — FLUDEOXYGLUCOSE F - 18 (FDG) INJECTION
7.2100 | Freq: Once | INTRAVENOUS | Status: AC | PRN
  Administered 2023-01-04: 7.21 via INTRAVENOUS

## 2023-01-18 ENCOUNTER — Inpatient Hospital Stay: Payer: Medicaid Other

## 2023-01-18 ENCOUNTER — Inpatient Hospital Stay: Payer: Medicaid Other | Attending: Hematology | Admitting: Hematology

## 2023-01-18 VITALS — Ht 61.0 in | Wt 145.0 lb

## 2023-01-18 DIAGNOSIS — C3491 Malignant neoplasm of unspecified part of right bronchus or lung: Secondary | ICD-10-CM | POA: Diagnosis not present

## 2023-01-18 DIAGNOSIS — Z452 Encounter for adjustment and management of vascular access device: Secondary | ICD-10-CM | POA: Insufficient documentation

## 2023-01-18 DIAGNOSIS — C7951 Secondary malignant neoplasm of bone: Secondary | ICD-10-CM | POA: Diagnosis not present

## 2023-01-18 DIAGNOSIS — C3431 Malignant neoplasm of lower lobe, right bronchus or lung: Secondary | ICD-10-CM | POA: Diagnosis present

## 2023-01-18 DIAGNOSIS — M546 Pain in thoracic spine: Secondary | ICD-10-CM | POA: Insufficient documentation

## 2023-01-18 DIAGNOSIS — Z95828 Presence of other vascular implants and grafts: Secondary | ICD-10-CM

## 2023-01-18 LAB — COMPREHENSIVE METABOLIC PANEL
ALT: 18 U/L (ref 0–44)
AST: 32 U/L (ref 15–41)
Albumin: 3.8 g/dL (ref 3.5–5.0)
Alkaline Phosphatase: 54 U/L (ref 38–126)
Anion gap: 13 (ref 5–15)
BUN: 7 mg/dL (ref 6–20)
CO2: 27 mmol/L (ref 22–32)
Calcium: 9.1 mg/dL (ref 8.9–10.3)
Chloride: 96 mmol/L — ABNORMAL LOW (ref 98–111)
Creatinine, Ser: 0.54 mg/dL (ref 0.44–1.00)
GFR, Estimated: >60 mL/min (ref >60)
Glucose, Bld: 136 mg/dL — ABNORMAL HIGH (ref 70–99)
Potassium: 3.9 mmol/L (ref 3.5–5.1)
Sodium: 136 mmol/L (ref 135–145)
Total Bilirubin: 0.6 mg/dL (ref <1.2)
Total Protein: 7.7 g/dL (ref 6.5–8.1)

## 2023-01-18 LAB — CBC WITH DIFFERENTIAL/PLATELET
Abs Immature Granulocytes: 0.06 10*3/uL (ref 0.00–0.07)
Basophils Absolute: 0 10*3/uL (ref 0.0–0.1)
Basophils Relative: 1 %
Eosinophils Absolute: 0.1 10*3/uL (ref 0.0–0.5)
Eosinophils Relative: 1 %
HCT: 42.3 % (ref 36.0–46.0)
Hemoglobin: 14.2 g/dL (ref 12.0–15.0)
Immature Granulocytes: 1 %
Lymphocytes Relative: 17 %
Lymphs Abs: 1.5 10*3/uL (ref 0.7–4.0)
MCH: 33.6 pg (ref 26.0–34.0)
MCHC: 33.6 g/dL (ref 30.0–36.0)
MCV: 100 fL (ref 80.0–100.0)
Monocytes Absolute: 0.6 10*3/uL (ref 0.1–1.0)
Monocytes Relative: 7 %
Neutro Abs: 6.4 10*3/uL (ref 1.7–7.7)
Neutrophils Relative %: 73 %
Platelets: 190 10*3/uL (ref 150–400)
RBC: 4.23 MIL/uL (ref 3.87–5.11)
RDW: 13 % (ref 11.5–15.5)
WBC: 8.7 10*3/uL (ref 4.0–10.5)
nRBC: 0 % (ref 0.0–0.2)

## 2023-01-18 LAB — MAGNESIUM: Magnesium: 1.6 mg/dL — ABNORMAL LOW (ref 1.7–2.4)

## 2023-01-18 MED ORDER — HEPARIN SOD (PORK) LOCK FLUSH 100 UNIT/ML IV SOLN
500.0000 [IU] | Freq: Once | INTRAVENOUS | Status: AC
  Administered 2023-01-18: 500 [IU] via INTRAVENOUS

## 2023-01-18 MED ORDER — SODIUM CHLORIDE 0.9% FLUSH
10.0000 mL | INTRAVENOUS | Status: DC | PRN
  Administered 2023-01-18: 10 mL via INTRAVENOUS

## 2023-01-18 NOTE — Progress Notes (Signed)
Patients port flushed without difficulty.  Good blood return noted with no bruising or swelling noted at site.  Band aid applied.  VSS with discharge and left in satisfactory condition with no s/s of distress noted.   

## 2023-01-18 NOTE — Patient Instructions (Addendum)
[facility] at [facility] Discharge Instructions   You were seen and examined today by Dr. Ellin Saba.  He reviewed the results of your lab work which are normal/stable.   He reviewed the results of your PET scan which was good. There is no evidence of cancer on this exam. The cancer that was present previously has responded to treatment.   Dr. Kirtland Bouchard recommends you restart treatment with just two drugs - Keytruda and Alimta. We will discontinue the drug (carboplatin) that caused you to have a reaction.      Thank you for choosing [facility] at [facility] to provide your oncology and hematology care.  To afford each patient quality time with our provider, please arrive at least 15 minutes before your scheduled appointment time.   If you have a lab appointment with the Cancer Center please come in thru the Main Entrance and check in at the main information desk.  You need to re-schedule your appointment should you arrive 10 or more minutes late.  We strive to give you quality time with our providers, and arriving late affects you and other patients whose appointments are after yours.  Also, if you no show three or more times for appointments you may be dismissed from the clinic at the providers discretion.     Again, thank you for choosing [facility]  Our hope is that these requests will decrease the amount of time that you wait before being seen by our physicians.       _____________________________________________________________  Should you have questions after your visit to [facility], please contact our office at [phone] and follow the prompts.  Our office hours are 8:00 a.m. and 4:30 p.m. Monday - Friday.  Please note that voicemails left after 4:00 p.m. may not be returned until the following business day.  We are closed weekends and major holidays.  You do have access to a nurse 24-7, just call the main number  to the clinic [phone] and do not press any options, hold on the line and a nurse will answer the phone.    For prescription refill requests, have your pharmacy contact our office and allow 72 hours.    Due to Covid, you will need to wear a mask upon entering the hospital. If you do not have a mask, a mask will be given to you at the Main Entrance upon arrival. For doctor visits, patients may have 1 support person age 64 or older with them. For treatment visits, patients can not have anyone with them due to social distancing guidelines and our immunocompromised population.

## 2023-01-18 NOTE — Progress Notes (Signed)
[facility] [address] [place] [address]    Clinic Day:  01/18/23    Referring physician: Benita Stabile, MD  Patient Care Team: Benita Stabile, MD as PCP - General (Internal Medicine) Jena Gauss Gerrit Friends, MD as Consulting Physician (Gastroenterology) Doreatha Massed, MD as Consulting Physician (Medical Oncology)   ASSESSMENT & PLAN:   Assessment: 1.  Metastatic lung adenocarcinoma: -PET scan on 05/13/2019 showed right lower lobe nodule concerning for bronchogenic carcinoma.  Cirrhotic liver. -Right lower lobectomy and lymph node excision on 06/19/2019. -Pathology showed 1.7 cm right lower lobe adenocarcinoma, unifocal, lymphovascular invasion present.  Margins negative.  2/14 lymph nodes positive (level 7 and 10R).  PT1CPN2. -PD-L1 30%, K-ras G12A, MSI stable.  EGFR mutation not identified. -4 cycles of adjuvant carboplatin and pemetrexed from 09/09/2019 through 11/11/2019. - CT chest on 12/29/2021: Lytic metastatic lesion in the posterior left fifth rib.  Interval enlargement of cavitary nodule of the dependent right upper lobe.  Numerous new clustered nodules of varying sizes. - PET scan (01/19/2022): Cavitary nodule of concern in the right middle lobe measures 1.3 x 0.7 cm, SUV max of 1.8.  No other hypermetabolic or suspicious lung nodules.  Expansile lytic metastasis involving left fifth rib hypermetabolic measuring 2.6 x 1.9 cm with SUV 4.2. - Left rib biopsy (02/06/2022): Metastatic moderately to poorly differentiated adenocarcinoma - NGS by [facility]: PD-L1 (22 C3)-TPS 5%.  TMB-high.  K-ras G12 A pathogenic variant.  T p53 pathogenic variant.  Other targetable mutations negative.  MSI-stable. - XRT to the left rib lesion completed - SBRT to the T5 lesion from 06/12/2022 through 06/16/2022 - Cycle 1 of carboplatin, pemetrexed and pembrolizumab on 06/29/2022    Plan: 1.  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 - Due to insurance reasons she did not follow-up.  She has also missed PET scan multiple times. - She finally decided to do PET scan on 01/04/2023. - PET scan on 01/04/2023 reviewed by me shows nodularity in the right middle lobe is resolved.  Prior osseous metastatic disease have improved. - Labs today: Normal LFTs and creatinine.  CBC was normal. - She had allergic reaction to carboplatin at cycle 4.  We talked about restarting pemetrexed and pembrolizumab.  She is agreeable.  We will start in the week of 02/05/2023.  RTC 6 weeks for follow-up.   2.  Left mid back pain: - Continue tramadol as needed.   3.  Hypomagnesemia: - Continue magnesium twice daily.  Magnesium today is 1.6.   No orders of the defined types were placed in this encounter.      [name],acting as a Neurosurgeon for Doreatha Massed, MD.,have documented all relevant documentation on the behalf of Doreatha Massed, MD,as directed by  Doreatha Massed, MD while in the presence of Doreatha Massed, MD.  I, Doreatha Massed MD, have reviewed the above documentation for accuracy and completeness, and I agree with the above.       Doreatha Massed, MD   12/19/20245:24 PM  CHIEF COMPLAINT:   Diagnosis: metastatic right lung cancer    Cancer Staging  Non-small cell cancer of right lung [facility]) Staging form: Lung, AJCC 8th Edition - Clinical stage from 02/13/2022: Stage IV (cT1c, cN2, pM1) - Signed by Doreatha Massed, MD on 02/13/2022    Prior Therapy: 1. Right lower lobectomy on 06/19/2019. 2. Carboplatin and pemetrexed x 4 cycles from 09/09/2019 to  11/11/2019 3. SBRT to left rib/chest wall lesion, completed 03/22/22 4. SBRT to T5 lesion, 06/12/22 - 06/16/22  Current Therapy:  carboplatin, pemetrexed and pembrolizumab    HISTORY OF PRESENT ILLNESS:   Oncology History  Adenocarcinoma of lung, stage 3, right (HCC)  07/02/2019 Initial Diagnosis    Adenocarcinoma of lung, stage 3, right (HCC)   07/18/2019 Genetic Testing   [facility]     07/23/2019 Genetic Testing   PD-L1     09/09/2019 - 11/11/2019 Chemotherapy   Patient is on Treatment Plan : LUNG NSCLC Pemetrexed (Alimta) / Carboplatin q21d x 4 cycles     Non-small cell cancer of right lung (HCC)  02/13/2022 Initial Diagnosis   Non-small cell cancer of right lung (HCC)   02/13/2022 Cancer Staging   Staging form: Lung, AJCC 8th Edition - Clinical stage from 02/13/2022: Stage IV (cT1c, cN2, pM1) - Signed by Doreatha Massed, MD on 02/13/2022 Histopathologic type: Adenocarcinoma, NOS Stage prefix: Initial diagnosis Histologic grade (G): G3 Histologic grading system: 4 grade system   06/29/2022 -  Chemotherapy   Patient is on Treatment Plan : LUNG Carboplatin (5) + Pemetrexed (500) + Pembrolizumab (200) D1 q21d Induction x 4 cycles / Maintenance Pemetrexed (500) + Pembrolizumab (200) D1 q21d        INTERVAL HISTORY:   Rachel Gross is a 57 y.o. female presenting to clinic today for follow up of metastatic right lung cancer. She was last seen by me on 11/09/22.  Since her last visit, she underwent restaging PET on 01/04/23 that found: prior nodular opacity in the posterior right middle lobe has resolved; prior osseous metastases have improved/resolved; suspected overlying radiation changes along the lateral left hemithorax; and no evidence of recurrent or metastatic disease.  Today, she states that she is doing well overall. Her appetite level is at 0%. Her energy level is at 0%. She c/o a head cold. She reports lower back pain. During her last treatment, she had a reaction to carboplatin.   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    Bipolar disorder (HCC)    Cancer (HCC)    right lung    COPD (chronic obstructive pulmonary disease) (HCC)    Depression    Dyspnea    Emphysema lung (HCC)    GERD (gastroesophageal reflux disease)    Hepatitis C    s/p treatment with Epclusa   History of HPV infection    History of pneumonia    Pneumonia    Smoker     Surgical History: Past Surgical History:  Procedure Laterality Date   BIOPSY  05/02/2018   Procedure: BIOPSY;  Surgeon: Corbin Ade, MD;  [facility];  Service: Endoscopy;;  gastric   CESAREAN SECTION     CHEST TUBE INSERTION Right 06/26/2019   Procedure: Right CHEST TUBE REPLACEMENT;  Surgeon: Loreli Slot, MD;  Location[facility] OR;  Service: Thoracic;  Laterality: Right;   COLONOSCOPY WITH PROPOFOL N/A 03/03/2019   Procedure: COLONOSCOPY WITH PROPOFOL;  Surgeon: Corbin Ade, MD;  Location: [facility];  Service: Endoscopy;  Laterality: N/A;  9:15am   ESOPHAGOGASTRODUODENOSCOPY (EGD) WITH PROPOFOL N/A 05/02/2018   Procedure: ESOPHAGOGASTRODUODENOSCOPY (EGD) WITH PROPOFOL;  Surgeon: Corbin Ade, MD;  [facility]: [facility];  Service: Endoscopy;  Laterality:  N/A;  8:30am   EYE SURGERY Bilateral    cataract   HEMOSTASIS CLIP PLACEMENT  03/03/2019   Procedure: HEMOSTASIS CLIP PLACEMENT;  Surgeon: Corbin Ade, MD;  [facility]: [facility];  Service: Endoscopy;;   INTERCOSTAL NERVE BLOCK Right 06/19/2019   Procedure: Intercostal Nerve Block;  Surgeon: Loreli Slot, MD;  Location: [facility] OR;  Service: Thoracic;  Laterality: Right;   IR US GUIDE VASC ACCESS RIGHT  03/13/2018   LOBECTOMY     LYMPH NODE DISSECTION Right 06/19/2019   Procedure: Lymph Node Dissection;  Surgeon: Loreli Slot, MD;  Location: [facility] OR;  Service: Thoracic;  Laterality: Right;   POLYPECTOMY  03/03/2019   Procedure: POLYPECTOMY;  Surgeon: Corbin Ade, MD;  [facility]: [facility];  Service: Endoscopy;;   PORTACATH PLACEMENT Left 09/08/2019   Procedure: INSERTION PORT-A-CATH LEFT CHEST (attached catheter in left  subclavian);  Surgeon: Franky Macho, MD;  Location: [facility];  Service: General;  Laterality: Left;   TONSILLECTOMY     TOOTH EXTRACTION  01/31/2018   x 7   VIDEO BRONCHOSCOPY WITH INSERTION OF INTERBRONCHIAL VALVE (IBV) N/A 06/26/2019   Procedure: VIDEO BRONCHOSCOPY WITH INSERTION OF TWO INTERBRONCHIAL VALVE (IBV);  Surgeon: Loreli Slot, MD;  Location: [facility] OR;  Service: Thoracic;  Laterality: N/A;   VIDEO BRONCHOSCOPY WITH INSERTION OF INTERBRONCHIAL VALVE (IBV) N/A 08/08/2019   Procedure: VIDEO BRONCHOSCOPY WITH REMOVAL OF INTERBRONCHIAL VALVE (IBV);  Surgeon: Loreli Slot, MD;  Location: [facility] OR;  Service: Thoracic;  Laterality: N/A;    Social History: Social History   Socioeconomic History   Marital status: Single    Spouse name: Not on file   Number of children: Not on file   Years of education: Not on file   Highest education level: Not on file  Occupational History   Not on file  Tobacco Use   Smoking status: Former    Current packs/day: 0.00    Average packs/day: 0.5 packs/day for 38.0 years (19.0 ttl pk-yrs)    Types: Cigarettes    Start date: 06/18/1981    Quit date: 06/19/2019    Years since quitting: 3.5   Smokeless tobacco: Never  Vaping Use   Vaping status: Never Used  Substance and Sexual Activity   Alcohol use: Not Currently    Comment: Last use of alcohol 03/2016   Drug use: Not Currently    Types: 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   Sexual activity: Not Currently  Other Topics Concern   Not on file  Social History Narrative   Not on file   Social Drivers of Health   Financial Resource Strain: Medium Risk (12/10/2019)   Overall Financial Resource Strain (CARDIA)    Difficulty of Paying Living Expenses: Somewhat hard  Food Insecurity: Food Insecurity Present (12/10/2019)   Hunger Vital Sign    Worried About Running Out of Food in the Last Year: Often true    Ran Out of Food in the Last Year: Often true  Transportation Needs: No Transportation  Needs (12/10/2019)   PRAPARE - Administrator, Civil Service (Medical): No    Lack of Transportation (Non-Medical): No  Physical Activity: Sufficiently Active (12/10/2019)   Exercise Vital Sign    Days of Exercise per Week: 3 days    Minutes of Exercise per Session: 150+ min  Stress: Stress Concern Present (12/10/2019)   [employer] of Occupational Health - Occupational Stress Questionnaire    Feeling of Stress :  Very much  Social Connections: Socially Isolated (12/10/2019)   Social Connection and Isolation Panel [NHANES]    Frequency of Communication with Friends and Family: More than three times a week    Frequency of Social Gatherings with Friends and Family: Once a week    Attends Religious Services: Never    Database administrator or Organizations: No    Attends Banker Meetings: Never    Marital Status: Divorced  Catering manager Violence: Not At Risk (12/10/2019)   Humiliation, Afraid, Rape, and Kick questionnaire    Fear of Current or Ex-Partner: No    Emotionally Abused: No    Physically Abused: No    Sexually Abused: No    Family History: Family History  Problem Relation Age of Onset   Congenital heart disease Mother    Bipolar disorder Mother    Prostate cancer Brother    Alzheimer's disease Paternal Grandmother    Colon cancer Neg Hx     Current Medications:  Current Outpatient Medications:    albuterol (VENTOLIN HFA) 108 (90 Base) MCG/ACT inhaler, Inhale 2 puffs into the lungs every 4 (four) hours as needed for wheezing or shortness of breath., Disp: , Rfl:    chlorhexidine (PERIDEX) 0.12 % solution, SMARTSIG:By Mouth, Disp: , Rfl:    clindamycin (CLEOCIN) 300 MG capsule, Take 300 mg by mouth 3 (three) times daily., Disp: , Rfl:    dexamethasone (DECADRON) 2 MG tablet, Take 1 tablet (2 mg total) by mouth 2 (two) times daily., Disp: 60 tablet, Rfl: 0   diphenhydrAMINE (BENADRYL) 25 MG tablet, Take 25 mg by mouth at bedtime., Disp:  , Rfl:    diphenoxylate-atropine (LOMOTIL) 2.5-0.025 MG tablet, Take 2 tablets by mouth 4 (four) times daily as needed for diarrhea or loose stools., Disp: 240 tablet, Rfl: 0   divalproex (DEPAKOTE) 250 MG DR tablet, Take 250 mg by mouth at bedtime. , Disp: , Rfl:    folic acid (FOLVITE) 1 MG tablet, Take 1 tablet (1 mg total) by mouth daily., Disp: 90 tablet, Rfl: 1   ipratropium-albuterol (DUONEB) 0.5-2.5 (3) MG/3ML SOLN, USE 1 VIAL VIA NEBULIZER EVERY 6 HOURS AS NEEDED, Disp: 360 mL, Rfl: 3   lidocaine-prilocaine (EMLA) cream, Apply a small amount to port a cath site and cover with plastic wrap 1 hour prior to chemotherapy appointments, Disp: 30 g, Rfl: 3   magnesium oxide (MAG-OX) 400 (240 Mg) MG tablet, Take 1 tablet (400 mg total) by mouth 2 (two) times daily., Disp: 120 tablet, Rfl: 4   ondansetron (ZOFRAN ODT) 8 MG disintegrating tablet, Place 1 tablet under tongue every 8 hours as needed for nausea. You may begin using this medication on the third day after each chemotherapy treatment., Disp: 20 tablet, Rfl: 1   pantoprazole (PROTONIX) 40 MG tablet, TAKE 1 TABLET BY MOUTH DAILY BEFORE BREAKFAST, Disp: 90 tablet, Rfl: 3   prochlorperazine (COMPAZINE) 10 MG tablet, Take 1 tablet (10 mg total) by mouth every 6 (six) hours as needed for nausea or vomiting., Disp: 60 tablet, Rfl: 5   risperiDONE (RISPERDAL) 2 MG tablet, Take 2 mg by mouth at bedtime. , Disp: , Rfl:    scopolamine (TRANSDERM-SCOP) 1 MG/3DAYS, Place 1 patch (1.5 mg total) onto the skin every 3 (three) days., Disp: 10 patch, Rfl: 12   SYMBICORT 160-4.5 MCG/ACT inhaler, Inhale 2 puffs into the lungs 2 (two) times daily., Disp: 1 each, Rfl: 6   traMADol (ULTRAM) 50 MG tablet, Take 1 tablet (50  mg total) by mouth 2 (two) times daily as needed., Disp: 60 tablet, Rfl: 0   traMADol HCl 100 MG TABS, Take 100 mg by mouth daily as needed., Disp: 30 tablet, Rfl: 0   traZODone (DESYREL) 100 MG tablet, Take by mouth., Disp: , Rfl:     Allergies: Allergies  Allergen Reactions   Folic Acid Anaphylaxis    Not entirely clear if it is from folic acid. She is taking folic acid now and doesn't have the throat swelling anymore   Penicillins Anaphylaxis    Throat swelled Did it involve swelling of the face/tongue/throat, SOB, or low BP? Yes Did it involve sudden or severe rash/hives, skin peeling, or any reaction on the inside of your mouth or nose? No Did you need to seek medical attention at a hospital or doctor's office? No When did it last happen?      childhood allergy If all above answers are "NO", may proceed with cephalosporin use.    Amoxicillin     hallucinations Did it involve swelling of the face/tongue/throat, SOB, or low BP? No Did it involve sudden or severe rash/hives, skin peeling, or any reaction on the inside of your mouth or nose? No Did you need to seek medical attention at a hospital or doctor's office? Yes When did it last happen?      July 2019 If all above answers are "NO", may proceed with cephalosporin use.    Carboplatin Nausea Only, Other (See Comments) and Cough    Patient complaints of feeling hot and flushed. See progress note from 7/11. Patient able to complete infusion after additional medications given.  Fingers itch, nausea Second reaction on 08/30/2022; included itching of hands and nausea; see progress note from 08/30/2022   Fish Allergy Nausea Only   Other Swelling    Patient states that the sunrise brand folic acid made her feel like her throat was swelling.     REVIEW OF SYSTEMS:   Review of Systems  Constitutional:  Negative for chills, fatigue and fever.  HENT:   Negative for lump/mass, mouth sores, nosebleeds, sore throat and trouble swallowing.   Eyes:  Negative for eye problems.  Respiratory:  Positive for cough and shortness of breath.   Cardiovascular:  Positive for palpitations. Negative for chest pain and leg swelling.  Gastrointestinal:  Positive for abdominal pain  and nausea. Negative for constipation, diarrhea and vomiting.  Genitourinary:  Negative for bladder incontinence, difficulty urinating, dysuria, frequency, hematuria and nocturia.   Musculoskeletal:  Positive for back pain (10/10 severity). Negative for arthralgias, flank pain, myalgias and neck pain.       +pain in the feet, 10/10 severity  Skin:  Negative for itching and rash.  Neurological:  Positive for dizziness and headaches. Negative for numbness.  Hematological:  Does not bruise/bleed easily.  Psychiatric/Behavioral:  Negative for depression, sleep disturbance and suicidal ideas. The patient is not nervous/anxious.   All other systems reviewed and are negative.    VITALS:   Height 5\' 1"  (1.549 m), weight 145 lb (65.8 kg).  Wt Readings from Last 3 Encounters:  01/18/23 145 lb (65.8 kg)  11/09/22 139 lb 12.8 oz (63.4 kg)  08/30/22 150 lb 4.8 oz (68.2 kg)    Body mass index is 27.4 kg/m.  Performance status (ECOG): 1 - Symptomatic but completely ambulatory  PHYSICAL EXAM:   Physical Exam Vitals and nursing note reviewed. Exam conducted with a chaperone present.  Constitutional:      Appearance: Normal appearance.  Cardiovascular:     Rate and Rhythm: Normal rate and regular rhythm.     Pulses: Normal pulses.     Heart sounds: Normal heart sounds.  Pulmonary:     Effort: Pulmonary effort is normal.     Breath sounds: Normal breath sounds.  Abdominal:     Palpations: Abdomen is soft. There is no hepatomegaly, splenomegaly or mass.     Tenderness: There is no abdominal tenderness.  Musculoskeletal:     Right lower leg: No edema.     Left lower leg: No edema.  Lymphadenopathy:     Cervical: No cervical adenopathy.     Right cervical: No superficial, deep or posterior cervical adenopathy.    Left cervical: No superficial, deep or posterior cervical adenopathy.     Upper Body:     Right upper body: No supraclavicular or axillary adenopathy.     Left upper body: No  supraclavicular or axillary adenopathy.  Neurological:     General: No focal deficit present.     Mental Status: She is alert and oriented to person, place, and time.  Psychiatric:        Mood and Affect: Mood normal.        Behavior: Behavior normal.     LABS:      Latest Ref Rng & Units 01/18/2023    1:51 PM 11/09/2022   11:57 AM 08/30/2022   10:18 AM  CBC  WBC 4.0 - 10.5 K/uL 8.7  5.4  5.4   Hemoglobin 12.0 - 15.0 g/dL 52.8  41.3  24.4   Hematocrit 36.0 - 46.0 % 42.3  39.5  36.2   Platelets 150 - 400 K/uL 190  134  279       Latest Ref Rng & Units 01/18/2023    1:51 PM 11/09/2022   11:57 AM 08/30/2022   10:18 AM  CMP  Glucose 70 - 99 mg/dL 010  272  536   BUN 6 - 20 mg/dL 7  7  <5   Creatinine 6.44 - 1.00 mg/dL 0.34  7.42  5.95   Sodium 135 - 145 mmol/L 136  132  130   Potassium 3.5 - 5.1 mmol/L 3.9  3.3  3.3   Chloride 98 - 111 mmol/L 96  95  93   CO2 22 - 32 mmol/L 27  25  24    Calcium 8.9 - 10.3 mg/dL 9.1  8.7  8.9   Total Protein 6.5 - 8.1 g/dL 7.7  6.7  7.2   Total Bilirubin <1.2 mg/dL 0.6  0.4  0.7   Alkaline Phos 38 - 126 U/L 54  37  44   AST 15 - 41 U/L 32  22  67   ALT 0 - 44 U/L 18  12  31       No results found for: "CEA1", "CEA" / No results found for: "CEA1", "CEA" No results found for: "PSA1" No results found for: "GLO756" No results found for: "CAN125"  No results found for: "TOTALPROTELP", "ALBUMINELP", "A1GS", "A2GS", "BETS", "BETA2SER", "GAMS", "MSPIKE", "SPEI" Lab Results  Component Value Date   TIBC 362 01/17/2018   FERRITIN 136 01/17/2018   IRONPCTSAT 40 (H) 01/17/2018   Lab Results  Component Value Date   LDH 146 05/01/2019   LDH 168 11/28/2017   LDH 185 08/24/2017     STUDIES:   NM PET Image Restag (PS) Skull Base To Thigh Result Date: 01/15/2023 CLINICAL DATA:  Subsequent treatment strategy for lung cancer. EXAM: NUCLEAR  MEDICINE PET SKULL BASE TO THIGH TECHNIQUE: 7.2 mCi F-18 FDG was injected intravenously. Full-ring PET  imaging was performed from the skull base to thigh after the radiotracer. CT data was obtained and used for attenuation correction and anatomic localization. Fasting blood glucose: 127 mg/dl COMPARISON:  PET-CT dated 06/22/2022 FINDINGS: Mediastinal blood pool activity: SUV max 1.8 Liver activity: SUV max NA NECK: No hypermetabolic lymph nodes in the neck. Incidental CT findings: None. CHEST: Status post right lower lobectomy. Prior nodular opacity in the posterior right middle lobe has resolved. Subpleural nodular opacities in the lateral left hemithorax (series 203/image 25), overlying the left posterolateral 5th rib, max SUV 2.9. This appearance suggests radiation changes. No abnormality thoracic lymphadenopathy. Left chest port terminates at the cavoatrial junction. Incidental CT findings: Mild paraseptal emphysematous changes. Atherosclerotic calcifications of the aortic arch. Mild three-vessel coronary atherosclerosis. ABDOMEN/PELVIS: No abnormal hypermetabolic activity within the liver, pancreas, adrenal glands, or spleen. No hypermetabolic lymph nodes in the abdomen or pelvis. Incidental CT findings: Cirrhosis. Atherosclerotic calcifications of the abdominal aorta and branch vessels. Left colonic diverticulosis, without evidence of diverticulitis. SKELETON: No residual osseous hypermetabolism at T5. Improved soft tissue component associated with the expansile lytic metastasis involving the posterolateral left rib (series 203/image 23), without residual hypermetabolism. Incidental CT findings: None. IMPRESSION: Status post right lower lobectomy. Prior nodular opacity in the posterior right middle lobe has resolved. Prior osseous metastases have improved/resolved. Suspected overlying radiation changes along the lateral left hemithorax. No evidence of recurrent or metastatic disease. Electronically Signed   By: Charline Bills M.D.   On: 01/15/2023 23:45

## 2023-01-19 ENCOUNTER — Other Ambulatory Visit: Payer: Self-pay

## 2023-02-05 ENCOUNTER — Inpatient Hospital Stay: Payer: Medicaid Other

## 2023-02-05 ENCOUNTER — Inpatient Hospital Stay: Payer: Medicaid Other | Attending: Hematology

## 2023-02-05 VITALS — BP 116/62 | HR 78 | Temp 97.7°F | Resp 18

## 2023-02-05 DIAGNOSIS — C3431 Malignant neoplasm of lower lobe, right bronchus or lung: Secondary | ICD-10-CM | POA: Insufficient documentation

## 2023-02-05 DIAGNOSIS — Z95828 Presence of other vascular implants and grafts: Secondary | ICD-10-CM

## 2023-02-05 DIAGNOSIS — M546 Pain in thoracic spine: Secondary | ICD-10-CM | POA: Diagnosis not present

## 2023-02-05 DIAGNOSIS — Z5111 Encounter for antineoplastic chemotherapy: Secondary | ICD-10-CM | POA: Diagnosis present

## 2023-02-05 DIAGNOSIS — Z5112 Encounter for antineoplastic immunotherapy: Secondary | ICD-10-CM | POA: Diagnosis present

## 2023-02-05 DIAGNOSIS — C3491 Malignant neoplasm of unspecified part of right bronchus or lung: Secondary | ICD-10-CM

## 2023-02-05 DIAGNOSIS — Z79620 Long term (current) use of immunosuppressive biologic: Secondary | ICD-10-CM | POA: Insufficient documentation

## 2023-02-05 LAB — COMPREHENSIVE METABOLIC PANEL
ALT: 11 U/L (ref 0–44)
AST: 21 U/L (ref 15–41)
Albumin: 3.6 g/dL (ref 3.5–5.0)
Alkaline Phosphatase: 42 U/L (ref 38–126)
Anion gap: 10 (ref 5–15)
BUN: 8 mg/dL (ref 6–20)
CO2: 27 mmol/L (ref 22–32)
Calcium: 9.2 mg/dL (ref 8.9–10.3)
Chloride: 97 mmol/L — ABNORMAL LOW (ref 98–111)
Creatinine, Ser: 0.49 mg/dL (ref 0.44–1.00)
GFR, Estimated: >60 mL/min (ref >60)
Glucose, Bld: 113 mg/dL — ABNORMAL HIGH (ref 70–99)
Potassium: 3.5 mmol/L (ref 3.5–5.1)
Sodium: 134 mmol/L — ABNORMAL LOW (ref 135–145)
Total Bilirubin: 0.6 mg/dL (ref 0.0–1.2)
Total Protein: 7.3 g/dL (ref 6.5–8.1)

## 2023-02-05 LAB — CBC WITH DIFFERENTIAL/PLATELET
Abs Immature Granulocytes: 0.02 10*3/uL (ref 0.00–0.07)
Basophils Absolute: 0 10*3/uL (ref 0.0–0.1)
Basophils Relative: 0 %
Eosinophils Absolute: 0 10*3/uL (ref 0.0–0.5)
Eosinophils Relative: 0 %
HCT: 42.2 % (ref 36.0–46.0)
Hemoglobin: 14.4 g/dL (ref 12.0–15.0)
Immature Granulocytes: 0 %
Lymphocytes Relative: 15 %
Lymphs Abs: 1 10*3/uL (ref 0.7–4.0)
MCH: 34 pg (ref 26.0–34.0)
MCHC: 34.1 g/dL (ref 30.0–36.0)
MCV: 99.5 fL (ref 80.0–100.0)
Monocytes Absolute: 0.6 10*3/uL (ref 0.1–1.0)
Monocytes Relative: 9 %
Neutro Abs: 5.3 10*3/uL (ref 1.7–7.7)
Neutrophils Relative %: 76 %
Platelets: 179 10*3/uL (ref 150–400)
RBC: 4.24 MIL/uL (ref 3.87–5.11)
RDW: 13.1 % (ref 11.5–15.5)
WBC: 7 10*3/uL (ref 4.0–10.5)
nRBC: 0 % (ref 0.0–0.2)

## 2023-02-05 LAB — TSH: TSH: 1.056 u[IU]/mL (ref 0.350–4.500)

## 2023-02-05 LAB — MAGNESIUM: Magnesium: 1.6 mg/dL — ABNORMAL LOW (ref 1.7–2.4)

## 2023-02-05 MED ORDER — SODIUM CHLORIDE 0.9 % IV SOLN
200.0000 mg | Freq: Once | INTRAVENOUS | Status: AC
  Administered 2023-02-05: 200 mg via INTRAVENOUS
  Filled 2023-02-05: qty 8

## 2023-02-05 MED ORDER — CYANOCOBALAMIN 1000 MCG/ML IJ SOLN
1000.0000 ug | Freq: Once | INTRAMUSCULAR | Status: AC
  Administered 2023-02-05: 1000 ug via INTRAMUSCULAR
  Filled 2023-02-05: qty 1

## 2023-02-05 MED ORDER — SODIUM CHLORIDE 0.9 % IV SOLN: Freq: Once | INTRAVENOUS | Status: AC

## 2023-02-05 MED ORDER — HEPARIN SOD (PORK) LOCK FLUSH 100 UNIT/ML IV SOLN
500.0000 [IU] | Freq: Once | INTRAVENOUS | Status: AC | PRN
  Administered 2023-02-05: 500 [IU]

## 2023-02-05 MED ORDER — SODIUM CHLORIDE 0.9% FLUSH
10.0000 mL | INTRAVENOUS | Status: DC | PRN
  Administered 2023-02-05: 10 mL via INTRAVENOUS

## 2023-02-05 MED ORDER — SODIUM CHLORIDE 0.9 % IV SOLN
400.0000 mg/m2 | Freq: Once | INTRAVENOUS | Status: AC
  Administered 2023-02-05: 700 mg via INTRAVENOUS
  Filled 2023-02-05: qty 20

## 2023-02-05 MED ORDER — MAGNESIUM SULFATE 2 GM/50ML IV SOLN
2.0000 g | Freq: Once | INTRAVENOUS | Status: AC
  Administered 2023-02-05: 2 g via INTRAVENOUS
  Filled 2023-02-05: qty 50

## 2023-02-05 MED ORDER — PROCHLORPERAZINE MALEATE 10 MG PO TABS
10.0000 mg | ORAL_TABLET | Freq: Once | ORAL | Status: AC
  Administered 2023-02-05: 10 mg via ORAL
  Filled 2023-02-05: qty 1

## 2023-02-05 NOTE — Progress Notes (Signed)
 Patients port flushed without difficulty.  Good blood return noted with no bruising or swelling noted at site.  Patient remains accessed for treatment.

## 2023-02-05 NOTE — Progress Notes (Signed)
 Patient tolerated chemotherapy with no complaints voiced.  Side effects with management reviewed with understanding verbalized.  Port site clean and dry with no bruising or swelling noted at site.  Good blood return noted before and after administration of chemotherapy.  Band aid applied.  Patient left in satisfactory condition with VSS and no s/s of distress noted. All follow ups as scheduled.   Venkat Ankney [employer]

## 2023-02-05 NOTE — Patient Instructions (Signed)
 [facility] - A DEPT OF [facility]  Discharge Instructions: Thank you for choosing [facility] to provide your oncology and hematology care.  If you have a lab appointment with the Cancer Center - please note that after April 8th, 2024, all labs will be drawn in the cancer center.  You do not have to check in or register with the main entrance as you have in the past but will complete your check-in in the cancer center.  Wear comfortable clothing and clothing appropriate for easy access to any Portacath or PICC line.   We strive to give you quality time with your provider. You may need to reschedule your appointment if you arrive late (15 or more minutes).  Arriving late affects you and other patients whose appointments are after yours.  Also, if you miss three or more appointments without notifying the office, you may be dismissed from the clinic at the provider's discretion.      For prescription refill requests, have your pharmacy contact our office and allow 72 hours for refills to be completed.    Today you received the following chemotherapy and/or immunotherapy agents keytruda  & alimta    To help prevent nausea and vomiting after your treatment, we encourage you to take your nausea medication as directed.  BELOW ARE SYMPTOMS THAT SHOULD BE REPORTED IMMEDIATELY: *FEVER GREATER THAN 100.4 F (38 C) OR HIGHER *CHILLS OR SWEATING *NAUSEA AND VOMITING THAT IS NOT CONTROLLED WITH YOUR NAUSEA MEDICATION *UNUSUAL SHORTNESS OF BREATH *UNUSUAL BRUISING OR BLEEDING *URINARY PROBLEMS (pain or burning when urinating, or frequent urination) *BOWEL PROBLEMS (unusual diarrhea, constipation, pain near the anus) TENDERNESS IN MOUTH AND THROAT WITH OR WITHOUT PRESENCE OF ULCERS (sore throat, sores in mouth, or a toothache) UNUSUAL RASH, SWELLING OR PAIN  UNUSUAL VAGINAL DISCHARGE OR ITCHING   Items with * indicate a potential emergency and should be  followed up as soon as possible or go to the Emergency Department if any problems should occur.  Please show the CHEMOTHERAPY ALERT CARD or IMMUNOTHERAPY ALERT CARD at check-in to the Emergency Department and triage nurse.  Should you have questions after your visit or need to cancel or reschedule your appointment, please contact [facility] [place] - A DEPT OF [facility] [phone]  and follow the prompts.  Office hours are 8:00 a.m. to 4:30 p.m. Monday - Friday. Please note that voicemails left after 4:00 p.m. may not be returned until the following business day.  We are closed weekends and major holidays. You have access to a nurse at all times for urgent questions. Please call the main number to the clinic [phone] and follow the prompts.  For any non-urgent questions, you may also contact your provider using MyChart. We now offer e-Visits for anyone 49 and older to request care online for non-urgent symptoms. For details visit [email or web address].   Also download the MyChart app! Go to the app store, search MyChart, open the app, select [place], and log in with your MyChart username and password.

## 2023-02-10 ENCOUNTER — Other Ambulatory Visit: Payer: Self-pay

## 2023-02-25 NOTE — Progress Notes (Signed)
[facility] [address] [place] [address]    Clinic Day:  02/26/2023  Referring physician: Benita Stabile, MD  Patient Care Team: Benita Stabile, MD as PCP - General (Internal Medicine) Jena Gauss Gerrit Friends, MD as Consulting Physician (Gastroenterology) Doreatha Massed, MD as Consulting Physician (Medical Oncology)   ASSESSMENT & PLAN:   Assessment: 1.  Metastatic lung adenocarcinoma: -PET scan on 05/13/2019 showed right lower lobe nodule concerning for bronchogenic carcinoma.  Cirrhotic liver. -Right lower lobectomy and lymph node excision on 06/19/2019. -Pathology showed 1.7 cm right lower lobe adenocarcinoma, unifocal, lymphovascular invasion present.  Margins negative.  2/14 lymph nodes positive (level 7 and 10R).  PT1CPN2. -PD-L1 30%, K-ras G12A, MSI stable.  EGFR mutation not identified. -4 cycles of adjuvant carboplatin and pemetrexed from 09/09/2019 through 11/11/2019. - CT chest on 12/29/2021: Lytic metastatic lesion in the posterior left fifth rib.  Interval enlargement of cavitary nodule of the dependent right upper lobe.  Numerous new clustered nodules of varying sizes. - PET scan (01/19/2022): Cavitary nodule of concern in the right middle lobe measures 1.3 x 0.7 cm, SUV max of 1.8.  No other hypermetabolic or suspicious lung nodules.  Expansile lytic metastasis involving left fifth rib hypermetabolic measuring 2.6 x 1.9 cm with SUV 4.2. - Left rib biopsy (02/06/2022): Metastatic moderately to poorly differentiated adenocarcinoma - NGS by [facility]: PD-L1 (22 C3)-TPS 5%.  TMB-high.  K-ras G12 A pathogenic variant.  T p53 pathogenic variant.  Other targetable mutations negative.  MSI-stable. - XRT to the left rib lesion completed - SBRT to the T5 lesion from 06/12/2022 through 06/16/2022 - Cycle 1 of carboplatin, pemetrexed and pembrolizumab on 06/29/2022    Plan: 1.  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  Prior bone metastatic disease has improved. - She has tolerated last cycle of pemetrexed and pembrolizumab very well. - She reported recent upper respiratory infection and has some lingering cough. - On auscultation, inspiratory rhonchi present.  She has not been using any inhalers.  I will refill albuterol and Symbicort today.  We will also give her nebulizer treatment with DuoNeb today. - Reviewed labs today: Normal LFTs.  CBC grossly normal.  TSH is 1.4. - Proceed with maintenance pemetrexed and pembrolizumab today and in 3 weeks.  RTC 6 weeks for follow-up.  Will obtain CT CAP with contrast prior to next visit.   2.  Left mid back pain: - Continue tramadol as needed.   3.  Hypomagnesemia: - She is taking magnesium once daily.  Magnesium is low at 1.5.  Recommend that she increase magnesium to twice daily.    Orders Placed This Encounter  Procedures   CT CHEST ABDOMEN PELVIS W CONTRAST    Standing Status:   Future    Expected Date:   03/29/2023    Expiration Date:   02/26/2024    If indicated for the ordered procedure, I authorize the administration of contrast media per Radiology protocol:   Yes    Does the patient have a contrast media/X-ray dye allergy?:   No    Preferred imaging location?:   [facility]    If indicated for the ordered procedure, I authorize the administration of oral contrast media per Radiology protocol:   Yes   Magnesium    Standing Status:   Future    Expected Date:   06/11/2023    Expiration Date:  06/10/2024   CBC with Differential    Standing Status:   Future    Expected Date:   06/11/2023    Expiration Date:   06/10/2024   Comprehensive metabolic panel    Standing Status:   Future    Expected Date:   06/11/2023    Expiration Date:   06/10/2024   T4    Standing Status:   Future    Expected Date:   06/11/2023    Expiration Date:   06/10/2024   TSH    Standing Status:   Future    Expected Date:   06/11/2023     Expiration Date:   06/10/2024   Magnesium    Standing Status:   Future    Expected Date:   07/02/2023    Expiration Date:   07/01/2024   CBC with Differential    Standing Status:   Future    Expected Date:   07/02/2023    Expiration Date:   07/01/2024   Comprehensive metabolic panel    Standing Status:   Future    Expected Date:   07/02/2023    Expiration Date:   07/01/2024      I,Katie Daubenspeck,acting as a scribe for Doreatha Massed, MD.,have documented all relevant documentation on the behalf of Doreatha Massed, MD,as directed by  Doreatha Massed, MD while in the presence of Doreatha Massed, MD.   I, Doreatha Massed MD, have reviewed the above documentation for accuracy and completeness, and I agree with the above.   Doreatha Massed, MD   1/27/202512:42 PM  CHIEF COMPLAINT:   Diagnosis: metastatic right lung cancer    Cancer Staging  Non-small cell cancer of right lung [facility]) Staging form: Lung, AJCC 8th Edition - Clinical stage from 02/13/2022: Stage IV (cT1c, cN2, pM1) - Signed by Doreatha Massed, MD on 02/13/2022    Prior Therapy: 1. Right lower lobectomy on 06/19/2019. 2. Carboplatin and pemetrexed x 4 cycles from 09/09/2019 to 11/11/2019 3. SBRT to left rib/chest wall lesion, completed 03/22/22 4. SBRT to T5 lesion, 06/12/22 - 06/16/22 5. carboplatin, pemetrexed and pembrolizumab, 06/29/22 through 08/30/22  Current Therapy:  pemetrexed and pembrolizumab    HISTORY OF PRESENT ILLNESS:   Oncology History  Adenocarcinoma of lung, stage 3, right (HCC)  07/02/2019 Initial Diagnosis   Adenocarcinoma of lung, stage 3, right (HCC)   07/18/2019 Genetic Testing   [facility]     07/23/2019 Genetic Testing   PD-L1     09/09/2019 - 11/11/2019 Chemotherapy   Patient is on Treatment Plan : LUNG NSCLC Pemetrexed (Alimta) / Carboplatin q21d x 4 cycles     Non-small cell cancer of right lung (HCC)  02/13/2022 Initial Diagnosis   Non-small cell cancer  of right lung (HCC)   02/13/2022 Cancer Staging   Staging form: Lung, AJCC 8th Edition - Clinical stage from 02/13/2022: Stage IV (cT1c, cN2, pM1) - Signed by Doreatha Massed, MD on 02/13/2022 Histopathologic type: Adenocarcinoma, NOS Stage prefix: Initial diagnosis Histologic grade (G): G3 Histologic grading system: 4 grade system   06/29/2022 -  Chemotherapy   Patient is on Treatment Plan : LUNG Carboplatin (5) + Pemetrexed (500) + Pembrolizumab (200) D1 q21d Induction x 4 cycles / Maintenance Pemetrexed (500) + Pembrolizumab (200) D1 q21d        INTERVAL HISTORY:   Burnie is a 58 y.o. female presenting to clinic today for follow up of metastatic right lung cancer. She was last seen by me on 01/18/23.  Today, she states that  she is doing well overall. Her appetite level is at 25%. Her energy level is at 25%.  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    Bipolar disorder (HCC)    Cancer (HCC)    right lung   COPD (chronic obstructive pulmonary disease) (HCC)    Depression    Dyspnea    Emphysema lung (HCC)    GERD (gastroesophageal reflux disease)    Hepatitis C    s/p treatment with Epclusa   History of HPV infection    History of pneumonia    Pneumonia    Smoker     Surgical History: Past Surgical History:  Procedure Laterality Date   BIOPSY  05/02/2018   Procedure: BIOPSY;  Surgeon: Corbin Ade, MD;  [facility];  Service: Endoscopy;;  gastric   CESAREAN SECTION     CHEST TUBE INSERTION Right 06/26/2019   Procedure: Right CHEST TUBE REPLACEMENT;  Surgeon: Loreli Slot, MD;  Location: [facility] OR;  Service: Thoracic;  Laterality: Right;   COLONOSCOPY WITH PROPOFOL N/A 03/03/2019   Procedure: COLONOSCOPY WITH PROPOFOL;  Surgeon: Corbin Ade, MD;  Location: [facility];  Service: Endoscopy;  Laterality: N/A;  9:15am   ESOPHAGOGASTRODUODENOSCOPY (EGD) WITH PROPOFOL N/A 05/02/2018   Procedure: ESOPHAGOGASTRODUODENOSCOPY (EGD) WITH PROPOFOL;  Surgeon: Corbin Ade, MD;  [facility];  Service: Endoscopy;  Laterality: N/A;  8:30am   EYE SURGERY Bilateral    cataract   HEMOSTASIS CLIP PLACEMENT  03/03/2019   Procedure: HEMOSTASIS CLIP PLACEMENT;  Surgeon: Corbin Ade, MD;  [facility]: [facility];  Service: Endoscopy;;   INTERCOSTAL NERVE BLOCK Right 06/19/2019   Procedure: Intercostal Nerve Block;  Surgeon: Loreli Slot, MD;  Location: [facility] OR;  Service: Thoracic;  Laterality: Right;   IR US GUIDE VASC ACCESS RIGHT  03/13/2018   LOBECTOMY     LYMPH NODE DISSECTION Right 06/19/2019   Procedure: Lymph Node Dissection;  Surgeon: Loreli Slot, MD;  Location: [facility] OR;  Service: Thoracic;  Laterality: Right;   POLYPECTOMY  03/03/2019   Procedure: POLYPECTOMY;  Surgeon: Corbin Ade, MD;  [facility];  Service: Endoscopy;;   PORTACATH PLACEMENT Left 09/08/2019   Procedure: INSERTION PORT-A-CATH LEFT CHEST (attached catheter in left subclavian);  Surgeon: Franky Macho, MD;  Location: [facility];  Service: General;  Laterality: Left;   TONSILLECTOMY     TOOTH EXTRACTION  01/31/2018   x 7   VIDEO BRONCHOSCOPY WITH INSERTION OF INTERBRONCHIAL VALVE (IBV) N/A 06/26/2019   Procedure: VIDEO BRONCHOSCOPY WITH INSERTION OF TWO INTERBRONCHIAL VALVE (IBV);  Surgeon: Loreli Slot, MD;  Location: [facility] OR;  Service: Thoracic;  Laterality: N/A;   VIDEO BRONCHOSCOPY WITH INSERTION OF INTERBRONCHIAL VALVE (IBV) N/A 08/08/2019   Procedure: VIDEO BRONCHOSCOPY WITH REMOVAL OF INTERBRONCHIAL VALVE (IBV);  Surgeon: Loreli Slot, MD;  Location: [facility] OR;  Service: Thoracic;  Laterality: N/A;    Social History: Social History   Socioeconomic History   Marital status: Single    Spouse name: Not on file   Number of children: Not on  file   Years of education: Not on file   Highest education level: Not on file  Occupational History   Not on file  Tobacco Use  Smoking status: Former    Current packs/day: 0.00    Average packs/day: 0.5 packs/day for 38.0 years (19.0 ttl pk-yrs)    Types: Cigarettes    Start date: 06/18/1981    Quit date: 06/19/2019    Years since quitting: 3.6   Smokeless tobacco: Never  Vaping Use   Vaping status: Never Used  Substance and Sexual Activity   Alcohol use: Not Currently    Comment: Last use of alcohol 03/2016   Drug use: Not Currently    Types: 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   Sexual activity: Not Currently  Other Topics Concern   Not on file  Social History Narrative   Not on file   Social Drivers of Health   Financial Resource Strain: Medium Risk (12/10/2019)   Overall Financial Resource Strain (CARDIA)    Difficulty of Paying Living Expenses: Somewhat hard  Food Insecurity: Food Insecurity Present (12/10/2019)   Hunger Vital Sign    Worried About Running Out of Food in the Last Year: Often true    Ran Out of Food in the Last Year: Often true  Transportation Needs: No Transportation Needs (12/10/2019)   PRAPARE - Administrator, Civil Service (Medical): No    Lack of Transportation (Non-Medical): No  Physical Activity: Sufficiently Active (12/10/2019)   Exercise Vital Sign    Days of Exercise per Week: 3 days    Minutes of Exercise per Session: 150+ min  Stress: Stress Concern Present (12/10/2019)   [employer] of Occupational Health - Occupational Stress Questionnaire    Feeling of Stress : Very much  Social Connections: Socially Isolated (12/10/2019)   Social Connection and Isolation Panel [NHANES]    Frequency of Communication with Friends and Family: More than three times a week    Frequency of Social Gatherings with Friends and Family: Once a week    Attends Religious Services: Never    Database administrator or Organizations: No    Attends Occupational hygienist Meetings: Never    Marital Status: Divorced  Catering manager Violence: Not At Risk (12/10/2019)   Humiliation, Afraid, Rape, and Kick questionnaire    Fear of Current or Ex-Partner: No    Emotionally Abused: No    Physically Abused: No    Sexually Abused: No    Family History: Family History  Problem Relation Age of Onset   Congenital heart disease Mother    Bipolar disorder Mother    Prostate cancer Brother    Alzheimer's disease Paternal Grandmother    Colon cancer Neg Hx     Current Medications:  Current Outpatient Medications:    chlorhexidine (PERIDEX) 0.12 % solution, SMARTSIG:By Mouth, Disp: , Rfl:    clindamycin (CLEOCIN) 300 MG capsule, Take 300 mg by mouth 3 (three) times daily., Disp: , Rfl:    dexamethasone (DECADRON) 2 MG tablet, Take 1 tablet (2 mg total) by mouth 2 (two) times daily., Disp: 60 tablet, Rfl: 0   diphenhydrAMINE (BENADRYL) 25 MG tablet, Take 25 mg by mouth at bedtime., Disp: , Rfl:    diphenoxylate-atropine (LOMOTIL) 2.5-0.025 MG tablet, Take 2 tablets by mouth 4 (four) times daily as needed for diarrhea or loose stools., Disp: 240 tablet, Rfl: 0   divalproex (DEPAKOTE) 250 MG DR tablet, Take 250 mg by mouth at bedtime. , Disp: , Rfl:    folic acid (FOLVITE) 1 MG tablet, Take 1 tablet (1 mg total) by mouth daily., Disp: 90 tablet, Rfl: 1  ipratropium-albuterol (DUONEB) 0.5-2.5 (3) MG/3ML SOLN, USE 1 VIAL VIA NEBULIZER EVERY 6 HOURS AS NEEDED, Disp: 360 mL, Rfl: 3   lidocaine-prilocaine (EMLA) cream, Apply a small amount to port a cath site and cover with plastic wrap 1 hour prior to chemotherapy appointments, Disp: 30 g, Rfl: 3   magnesium oxide (MAG-OX) 400 (240 Mg) MG tablet, Take 1 tablet (400 mg total) by mouth 2 (two) times daily., Disp: 120 tablet, Rfl: 4   ondansetron (ZOFRAN ODT) 8 MG disintegrating tablet, Place 1 tablet under tongue every 8 hours as needed for nausea. You may begin using this medication on the third day after  each chemotherapy treatment., Disp: 20 tablet, Rfl: 1   pantoprazole (PROTONIX) 40 MG tablet, TAKE 1 TABLET BY MOUTH DAILY BEFORE BREAKFAST, Disp: 90 tablet, Rfl: 3   prochlorperazine (COMPAZINE) 10 MG tablet, Take 1 tablet (10 mg total) by mouth every 6 (six) hours as needed for nausea or vomiting., Disp: 60 tablet, Rfl: 5   risperiDONE (RISPERDAL) 2 MG tablet, Take 2 mg by mouth at bedtime. , Disp: , Rfl:    scopolamine (TRANSDERM-SCOP) 1 MG/3DAYS, Place 1 patch (1.5 mg total) onto the skin every 3 (three) days., Disp: 10 patch, Rfl: 12   traMADol (ULTRAM) 50 MG tablet, Take 1 tablet (50 mg total) by mouth 2 (two) times daily as needed., Disp: 60 tablet, Rfl: 0   traMADol HCl 100 MG TABS, Take 100 mg by mouth daily as needed., Disp: 30 tablet, Rfl: 0   traZODone (DESYREL) 100 MG tablet, Take by mouth., Disp: , Rfl:    albuterol (VENTOLIN HFA) 108 (90 Base) MCG/ACT inhaler, Inhale 2 puffs into the lungs every 4 (four) hours as needed for wheezing or shortness of breath., Disp: 8 g, Rfl: 3   SYMBICORT 160-4.5 MCG/ACT inhaler, Inhale 2 puffs into the lungs 2 (two) times daily., Disp: 1 each, Rfl: 6 No current facility-administered medications for this visit.  Facility-Administered Medications Ordered in Other Visits:    0.9 %  sodium chloride infusion, , Intravenous, Once, Doreatha Massed, MD   heparin lock flush 100 unit/mL, 500 Units, Intracatheter, Once PRN, Doreatha Massed, MD   ipratropium-albuterol (DUONEB) 0.5-2.5 (3) MG/3ML nebulizer solution 3 mL, 3 mL, Nebulization, Once, Doreatha Massed, MD   pembrolizumab [facility]) 200 mg in sodium chloride 0.9 % 50 mL chemo infusion, 200 mg, Intravenous, Once, Doreatha Massed, MD   PEMEtrexed (ALIMTA) 700 mg in sodium chloride 0.9 % 100 mL chemo infusion, 400 mg/m2 (Treatment Plan Recorded), Intravenous, Once, Doreatha Massed, MD   prochlorperazine (COMPAZINE) tablet 10 mg, 10 mg, Oral, Once, Doreatha Massed, MD   sodium  chloride flush (NS) 0.9 % injection 10 mL, 10 mL, Intracatheter, PRN, Doreatha Massed, MD   Allergies: Allergies  Allergen Reactions   Folic Acid Anaphylaxis    Not entirely clear if it is from folic acid. She is taking folic acid now and doesn't have the throat swelling anymore   Penicillins Anaphylaxis    Throat swelled Did it involve swelling of the face/tongue/throat, SOB, or low BP? Yes Did it involve sudden or severe rash/hives, skin peeling, or any reaction on the inside of your mouth or nose? No Did you need to seek medical attention at a hospital or doctor's office? No When did it last happen?      childhood allergy If all above answers are "NO", may proceed with cephalosporin use.    Amoxicillin     hallucinations Did it involve swelling of the face/tongue/throat,  SOB, or low BP? No Did it involve sudden or severe rash/hives, skin peeling, or any reaction on the inside of your mouth or nose? No Did you need to seek medical attention at a hospital or doctor's office? Yes When did it last happen?      July 2019 If all above answers are "NO", may proceed with cephalosporin use.    Carboplatin Nausea Only, Other (See Comments) and Cough    Patient complaints of feeling hot and flushed. See progress note from 7/11. Patient able to complete infusion after additional medications given.  Fingers itch, nausea Second reaction on 08/30/2022; included itching of hands and nausea; see progress note from 08/30/2022   Fish Allergy Nausea Only   Other Swelling    Patient states that the sunrise brand folic acid made her feel like her throat was swelling.     REVIEW OF SYSTEMS:   Review of Systems  Constitutional:  Negative for chills, fatigue and fever.  HENT:   Negative for lump/mass, mouth sores, nosebleeds, sore throat and trouble swallowing.   Eyes:  Negative for eye problems.  Respiratory:  Positive for cough and shortness of breath.   Cardiovascular:  Negative for chest  pain, leg swelling and palpitations.  Gastrointestinal:  Negative for abdominal pain, constipation, diarrhea, nausea and vomiting.  Genitourinary:  Negative for bladder incontinence, difficulty urinating, dysuria, frequency, hematuria and nocturia.   Musculoskeletal:  Positive for back pain. Negative for arthralgias, flank pain, myalgias and neck pain.  Skin:  Negative for itching and rash.  Neurological:  Positive for dizziness and headaches. Negative for numbness.  Hematological:  Does not bruise/bleed easily.  Psychiatric/Behavioral:  Positive for depression. Negative for sleep disturbance and suicidal ideas. The patient is nervous/anxious.   All other systems reviewed and are negative.    VITALS:   Weight 146 lb 9.7 oz (66.5 kg).  Wt Readings from Last 3 Encounters:  02/26/23 146 lb 9.7 oz (66.5 kg)  02/05/23 145 lb (65.8 kg)  01/18/23 145 lb (65.8 kg)    Body mass index is 27.7 kg/m.  Performance status (ECOG): 1 - Symptomatic but completely ambulatory  PHYSICAL EXAM:   Physical Exam Vitals and nursing note reviewed. Exam conducted with a chaperone present.  Constitutional:      Appearance: Normal appearance.  Cardiovascular:     Rate and Rhythm: Normal rate and regular rhythm.     Pulses: Normal pulses.     Heart sounds: Normal heart sounds.  Pulmonary:     Effort: Pulmonary effort is normal.     Breath sounds: Normal breath sounds.  Abdominal:     Palpations: Abdomen is soft. There is no hepatomegaly, splenomegaly or mass.     Tenderness: There is no abdominal tenderness.  Musculoskeletal:     Right lower leg: No edema.     Left lower leg: No edema.  Lymphadenopathy:     Cervical: No cervical adenopathy.     Right cervical: No superficial, deep or posterior cervical adenopathy.    Left cervical: No superficial, deep or posterior cervical adenopathy.     Upper Body:     Right upper body: No supraclavicular or axillary adenopathy.     Left upper body: No  supraclavicular or axillary adenopathy.  Neurological:     General: No focal deficit present.     Mental Status: She is alert and oriented to person, place, and time.  Psychiatric:        Mood and Affect: Mood normal.  Behavior: Behavior normal.     LABS:   CBC     Component Value Date/Time   WBC 7.1 02/26/2023 1108   RBC 4.25 02/26/2023 1108   HGB 14.2 02/26/2023 1108   HCT 42.9 02/26/2023 1108   PLT 249 02/26/2023 1108   MCV 100.9 (H) 02/26/2023 1108   MCH 33.4 02/26/2023 1108   MCHC 33.1 02/26/2023 1108   RDW 14.1 02/26/2023 1108   LYMPHSABS 0.6 (L) 02/26/2023 1108   MONOABS 0.6 02/26/2023 1108   EOSABS 0.0 02/26/2023 1108   BASOSABS 0.0 02/26/2023 1108    CMP      Component Value Date/Time   NA 135 02/26/2023 1108   K 3.8 02/26/2023 1108   CL 96 (L) 02/26/2023 1108   CO2 29 02/26/2023 1108   GLUCOSE 117 (H) 02/26/2023 1108   BUN <5 (L) 02/26/2023 1108   CREATININE 0.48 02/26/2023 1108   CREATININE 0.45 (L) 09/10/2017 1422   CALCIUM 9.0 02/26/2023 1108   PROT 7.7 02/26/2023 1108   ALBUMIN 3.6 02/26/2023 1108   AST 44 (H) 02/26/2023 1108   ALT 20 02/26/2023 1108   ALKPHOS 58 02/26/2023 1108   BILITOT 0.6 02/26/2023 1108   GFRNONAA >60 02/26/2023 1108   GFRAA >60 10/21/2019 0942     No results found for: "CEA1", "CEA" / No results found for: "CEA1", "CEA" No results found for: "PSA1" No results found for: "ZOX096" No results found for: "CAN125"  No results found for: "TOTALPROTELP", "ALBUMINELP", "A1GS", "A2GS", "BETS", "BETA2SER", "GAMS", "MSPIKE", "SPEI" Lab Results  Component Value Date   TIBC 362 01/17/2018   FERRITIN 136 01/17/2018   IRONPCTSAT 40 (H) 01/17/2018   Lab Results  Component Value Date   LDH 146 05/01/2019   LDH 168 11/28/2017   LDH 185 08/24/2017     STUDIES:   No results found.

## 2023-02-26 ENCOUNTER — Other Ambulatory Visit: Payer: Self-pay | Admitting: *Deleted

## 2023-02-26 ENCOUNTER — Inpatient Hospital Stay: Payer: Medicaid Other

## 2023-02-26 ENCOUNTER — Inpatient Hospital Stay (HOSPITAL_BASED_OUTPATIENT_CLINIC_OR_DEPARTMENT_OTHER): Payer: Medicaid Other | Admitting: Hematology

## 2023-02-26 VITALS — BP 99/64 | HR 100 | Temp 98.1°F | Resp 20

## 2023-02-26 VITALS — BP 121/70 | HR 101 | Temp 97.8°F | Resp 19

## 2023-02-26 VITALS — Wt 146.6 lb

## 2023-02-26 DIAGNOSIS — C3491 Malignant neoplasm of unspecified part of right bronchus or lung: Secondary | ICD-10-CM

## 2023-02-26 DIAGNOSIS — R197 Diarrhea, unspecified: Secondary | ICD-10-CM

## 2023-02-26 DIAGNOSIS — R110 Nausea: Secondary | ICD-10-CM

## 2023-02-26 DIAGNOSIS — Z5112 Encounter for antineoplastic immunotherapy: Secondary | ICD-10-CM | POA: Diagnosis not present

## 2023-02-26 LAB — CBC WITH DIFFERENTIAL/PLATELET
Abs Immature Granulocytes: 0.03 10*3/uL (ref 0.00–0.07)
Basophils Absolute: 0 10*3/uL (ref 0.0–0.1)
Basophils Relative: 1 %
Eosinophils Absolute: 0 10*3/uL (ref 0.0–0.5)
Eosinophils Relative: 1 %
HCT: 42.9 % (ref 36.0–46.0)
Hemoglobin: 14.2 g/dL (ref 12.0–15.0)
Immature Granulocytes: 0 %
Lymphocytes Relative: 8 %
Lymphs Abs: 0.6 10*3/uL — ABNORMAL LOW (ref 0.7–4.0)
MCH: 33.4 pg (ref 26.0–34.0)
MCHC: 33.1 g/dL (ref 30.0–36.0)
MCV: 100.9 fL — ABNORMAL HIGH (ref 80.0–100.0)
Monocytes Absolute: 0.6 10*3/uL (ref 0.1–1.0)
Monocytes Relative: 8 %
Neutro Abs: 5.9 10*3/uL (ref 1.7–7.7)
Neutrophils Relative %: 82 %
Platelets: 249 10*3/uL (ref 150–400)
RBC: 4.25 MIL/uL (ref 3.87–5.11)
RDW: 14.1 % (ref 11.5–15.5)
WBC: 7.1 10*3/uL (ref 4.0–10.5)
nRBC: 0 % (ref 0.0–0.2)

## 2023-02-26 LAB — COMPREHENSIVE METABOLIC PANEL
ALT: 20 U/L (ref 0–44)
AST: 44 U/L — ABNORMAL HIGH (ref 15–41)
Albumin: 3.6 g/dL (ref 3.5–5.0)
Alkaline Phosphatase: 58 U/L (ref 38–126)
Anion gap: 10 (ref 5–15)
BUN: <5 mg/dL — ABNORMAL LOW (ref 6–20)
CO2: 29 mmol/L (ref 22–32)
Calcium: 9 mg/dL (ref 8.9–10.3)
Chloride: 96 mmol/L — ABNORMAL LOW (ref 98–111)
Creatinine, Ser: 0.48 mg/dL (ref 0.44–1.00)
GFR, Estimated: >60 mL/min (ref >60)
Glucose, Bld: 117 mg/dL — ABNORMAL HIGH (ref 70–99)
Potassium: 3.8 mmol/L (ref 3.5–5.1)
Sodium: 135 mmol/L (ref 135–145)
Total Bilirubin: 0.6 mg/dL (ref 0.0–1.2)
Total Protein: 7.7 g/dL (ref 6.5–8.1)

## 2023-02-26 LAB — TSH: TSH: 1.491 u[IU]/mL (ref 0.350–4.500)

## 2023-02-26 LAB — MAGNESIUM: Magnesium: 1.5 mg/dL — ABNORMAL LOW (ref 1.7–2.4)

## 2023-02-26 MED ORDER — ONDANSETRON HCL 4 MG PO TABS: 4.0000 mg | ORAL_TABLET | Freq: Three times a day (TID) | ORAL | 0 refills | Status: DC | PRN

## 2023-02-26 MED ORDER — SODIUM CHLORIDE 0.9% FLUSH
10.0000 mL | INTRAVENOUS | Status: DC | PRN
  Administered 2023-02-26: 10 mL

## 2023-02-26 MED ORDER — SODIUM CHLORIDE 0.9 % IV SOLN: Freq: Once | INTRAVENOUS | Status: AC

## 2023-02-26 MED ORDER — LOPERAMIDE HCL 2 MG PO CAPS: 2.0000 mg | ORAL_CAPSULE | ORAL | 0 refills | Status: DC | PRN

## 2023-02-26 MED ORDER — SODIUM CHLORIDE 0.9% FLUSH
10.0000 mL | Freq: Once | INTRAVENOUS | Status: AC
  Administered 2023-02-26: 10 mL via INTRAVENOUS

## 2023-02-26 MED ORDER — ALBUTEROL SULFATE HFA 108 (90 BASE) MCG/ACT IN AERS: 2.0000 | INHALATION_SPRAY | RESPIRATORY_TRACT | 3 refills | Status: DC | PRN

## 2023-02-26 MED ORDER — SYMBICORT 160-4.5 MCG/ACT IN AERO: 2.0000 | INHALATION_SPRAY | Freq: Two times a day (BID) | RESPIRATORY_TRACT | 6 refills | Status: DC

## 2023-02-26 MED ORDER — SODIUM CHLORIDE 0.9 % IV SOLN
200.0000 mg | Freq: Once | INTRAVENOUS | Status: AC
  Administered 2023-02-26: 200 mg via INTRAVENOUS
  Filled 2023-02-26: qty 8

## 2023-02-26 MED ORDER — IPRATROPIUM-ALBUTEROL 0.5-2.5 (3) MG/3ML IN SOLN
3.0000 mL | Freq: Once | RESPIRATORY_TRACT | Status: AC
  Administered 2023-02-26: 3 mL via RESPIRATORY_TRACT
  Filled 2023-02-26: qty 3

## 2023-02-26 MED ORDER — HEPARIN SOD (PORK) LOCK FLUSH 100 UNIT/ML IV SOLN
500.0000 [IU] | Freq: Once | INTRAVENOUS | Status: AC | PRN
  Administered 2023-02-26: 500 [IU]

## 2023-02-26 MED ORDER — SODIUM CHLORIDE 0.9 % IV SOLN
400.0000 mg/m2 | Freq: Once | INTRAVENOUS | Status: AC
  Administered 2023-02-26: 700 mg via INTRAVENOUS
  Filled 2023-02-26: qty 20

## 2023-02-26 MED ORDER — ALTEPLASE 2 MG IJ SOLR
2.0000 mg | Freq: Once | INTRAMUSCULAR | Status: AC
  Administered 2023-02-26: 2 mg
  Filled 2023-02-26: qty 2

## 2023-02-26 MED ORDER — PROCHLORPERAZINE MALEATE 10 MG PO TABS
10.0000 mg | ORAL_TABLET | Freq: Once | ORAL | Status: AC
  Administered 2023-02-26: 10 mg via ORAL
  Filled 2023-02-26: qty 1

## 2023-02-26 NOTE — Progress Notes (Signed)
Alteplase was administered at 1130 today, no blood return given . Ok to treat today per MD. Will send for dye study after treatment today per MD.

## 2023-02-26 NOTE — Progress Notes (Signed)
Patient tolerated chemotherapy with no complaints voiced. Side effects with management reviewed understanding verbalized. Port site clean and dry with no bruising or swelling noted at site. Good blood return noted after administration of chemotherapy per Dr. Landry Corporal patient no longer needs dye study today. Band aid applied. Patient left in satisfactory condition with VSS and no s/s of distress noted.

## 2023-02-26 NOTE — Progress Notes (Signed)
Alteplase administered at 1030 per protocol

## 2023-02-26 NOTE — Patient Instructions (Signed)
[facility] at [facility] Discharge Instructions   You were seen and examined today by Dr. Ellin Saba.  He reviewed the results of your lab work which are mostly normal/stable. Your magnesium is low today at 1.5. Dr. Kirtland Bouchard wants you to increase your magnesium at home to 1 pill twice a day.  We will proceed with your treatment today.   Return as scheduled.    Thank you for choosing [facility] at [facility] to provide your oncology and hematology care.  To afford each patient quality time with our provider, please arrive at least 15 minutes before your scheduled appointment time.   If you have a lab appointment with the Cancer Center please come in thru the Main Entrance and check in at the main information desk.  You need to re-schedule your appointment should you arrive 10 or more minutes late.  We strive to give you quality time with our providers, and arriving late affects you and other patients whose appointments are after yours.  Also, if you no show three or more times for appointments you may be dismissed from the clinic at the providers discretion.     Again, thank you for choosing [facility].  Our hope is that these requests will decrease the amount of time that you wait before being seen by our physicians.       _____________________________________________________________  Should you have questions after your visit to [facility], please contact our office at [phone] and follow the prompts.  Our office hours are 8:00 a.m. and 4:30 p.m. Monday - Friday.  Please note that voicemails left after 4:00 p.m. may not be returned until the following business day.  We are closed weekends and major holidays.  You do have access to a nurse 24-7, just call the main number to the clinic [phone] and do not press any options, hold on the line and a nurse will answer the phone.    For prescription refill requests, have  your pharmacy contact our office and allow 72 hours.    Due to Covid, you will need to wear a mask upon entering the hospital. If you do not have a mask, a mask will be given to you at the Main Entrance upon arrival. For doctor visits, patients may have 1 support person age 52 or older with them. For treatment visits, patients can not have anyone with them due to social distancing guidelines and our immunocompromised population.

## 2023-02-26 NOTE — Patient Instructions (Signed)
[facility] [place] - [facility]  Discharge Instructions: Thank you for choosing [facility] to provide your oncology and hematology care.  If you have a lab appointment with the Cancer Center - please note that after April 8th, 2024, all labs will be drawn in the cancer center.  You do not have to check in or register with the main entrance as you have in the past but will complete your check-in in the cancer center.  Wear comfortable clothing and clothing appropriate for easy access to any Portacath or PICC line.   We strive to give you quality time with your provider. You may need to reschedule your appointment if you arrive late (15 or more minutes).  Arriving late affects you and other patients whose appointments are after yours.  Also, if you miss three or more appointments without notifying the office, you may be dismissed from the clinic at the provider's discretion.      For prescription refill requests, have your pharmacy contact our office and allow 72 hours for refills to be completed.    Today you received the following chemotherapy and/or immunotherapy agents Keytruda and Alimta, return as scheduled.   To help prevent nausea and vomiting after your treatment, we encourage you to take your nausea medication as directed.  BELOW ARE SYMPTOMS THAT SHOULD BE REPORTED IMMEDIATELY: *FEVER GREATER THAN 100.4 F (38 C) OR HIGHER *CHILLS OR SWEATING *NAUSEA AND VOMITING THAT IS NOT CONTROLLED WITH YOUR NAUSEA MEDICATION *UNUSUAL SHORTNESS OF BREATH *UNUSUAL BRUISING OR BLEEDING *URINARY PROBLEMS (pain or burning when urinating, or frequent urination) *BOWEL PROBLEMS (unusual diarrhea, constipation, pain near the anus) TENDERNESS IN MOUTH AND THROAT WITH OR WITHOUT PRESENCE OF ULCERS (sore throat, sores in mouth, or a toothache) UNUSUAL RASH, SWELLING OR PAIN  UNUSUAL VAGINAL DISCHARGE OR ITCHING   Items with * indicate a potential  emergency and should be followed up as soon as possible or go to the Emergency Department if any problems should occur.  Please show the CHEMOTHERAPY ALERT CARD or IMMUNOTHERAPY ALERT CARD at check-in to the Emergency Department and triage nurse.  Should you have questions after your visit or need to cancel or reschedule your appointment, please contact [facility] [place] - A DEPT OF Eligha Bridegroom [facility] [phone]  and follow the prompts.  Office hours are 8:00 a.m. to 4:30 p.m. Monday - Friday. Please note that voicemails left after 4:00 p.m. may not be returned until the following business day.  We are closed weekends and major holidays. You have access to a nurse at all times for urgent questions. Please call the main number to the clinic [phone] and follow the prompts.  For any non-urgent questions, you may also contact your provider using MyChart. We now offer e-Visits for anyone 20 and older to request care online for non-urgent symptoms. For details visit [email or web address].   Also download the MyChart app! Go to the app store, search "MyChart", open the app, select [place], and log in with your MyChart username and password.

## 2023-02-27 ENCOUNTER — Other Ambulatory Visit: Payer: Self-pay

## 2023-03-04 ENCOUNTER — Other Ambulatory Visit: Payer: Self-pay

## 2023-03-12 ENCOUNTER — Other Ambulatory Visit: Payer: Self-pay | Admitting: Internal Medicine

## 2023-03-12 NOTE — Telephone Encounter (Signed)
 Pt needs an appt in 4 mths

## 2023-03-13 ENCOUNTER — Other Ambulatory Visit: Payer: Self-pay

## 2023-03-19 ENCOUNTER — Inpatient Hospital Stay: Payer: Medicaid Other | Attending: Hematology

## 2023-03-19 ENCOUNTER — Inpatient Hospital Stay: Payer: Medicaid Other

## 2023-03-19 VITALS — BP 103/63 | HR 103 | Temp 98.2°F | Resp 20

## 2023-03-19 DIAGNOSIS — C3491 Malignant neoplasm of unspecified part of right bronchus or lung: Secondary | ICD-10-CM

## 2023-03-19 DIAGNOSIS — Z5111 Encounter for antineoplastic chemotherapy: Secondary | ICD-10-CM | POA: Insufficient documentation

## 2023-03-19 DIAGNOSIS — Z5112 Encounter for antineoplastic immunotherapy: Secondary | ICD-10-CM | POA: Diagnosis present

## 2023-03-19 DIAGNOSIS — C3431 Malignant neoplasm of lower lobe, right bronchus or lung: Secondary | ICD-10-CM | POA: Diagnosis present

## 2023-03-19 LAB — COMPREHENSIVE METABOLIC PANEL
ALT: 14 U/L (ref 0–44)
AST: 33 U/L (ref 15–41)
Albumin: 3.2 g/dL — ABNORMAL LOW (ref 3.5–5.0)
Alkaline Phosphatase: 56 U/L (ref 38–126)
Anion gap: 12 (ref 5–15)
BUN: 6 mg/dL (ref 6–20)
CO2: 28 mmol/L (ref 22–32)
Calcium: 8.6 mg/dL — ABNORMAL LOW (ref 8.9–10.3)
Chloride: 92 mmol/L — ABNORMAL LOW (ref 98–111)
Creatinine, Ser: 0.41 mg/dL — ABNORMAL LOW (ref 0.44–1.00)
GFR, Estimated: >60 mL/min (ref >60)
Glucose, Bld: 114 mg/dL — ABNORMAL HIGH (ref 70–99)
Potassium: 3.9 mmol/L (ref 3.5–5.1)
Sodium: 132 mmol/L — ABNORMAL LOW (ref 135–145)
Total Bilirubin: 0.5 mg/dL (ref 0.0–1.2)
Total Protein: 7.4 g/dL (ref 6.5–8.1)

## 2023-03-19 LAB — CBC WITH DIFFERENTIAL/PLATELET
Abs Immature Granulocytes: 0.08 10*3/uL — ABNORMAL HIGH (ref 0.00–0.07)
Basophils Absolute: 0.1 10*3/uL (ref 0.0–0.1)
Basophils Relative: 0 %
Eosinophils Absolute: 0 10*3/uL (ref 0.0–0.5)
Eosinophils Relative: 0 %
HCT: 40.4 % (ref 36.0–46.0)
Hemoglobin: 13.3 g/dL (ref 12.0–15.0)
Immature Granulocytes: 1 %
Lymphocytes Relative: 10 %
Lymphs Abs: 1.1 10*3/uL (ref 0.7–4.0)
MCH: 33.4 pg (ref 26.0–34.0)
MCHC: 32.9 g/dL (ref 30.0–36.0)
MCV: 101.5 fL — ABNORMAL HIGH (ref 80.0–100.0)
Monocytes Absolute: 2 10*3/uL — ABNORMAL HIGH (ref 0.1–1.0)
Monocytes Relative: 18 %
Neutro Abs: 8.1 10*3/uL — ABNORMAL HIGH (ref 1.7–7.7)
Neutrophils Relative %: 71 %
Platelets: 299 10*3/uL (ref 150–400)
RBC: 3.98 MIL/uL (ref 3.87–5.11)
RDW: 15.9 % — ABNORMAL HIGH (ref 11.5–15.5)
WBC: 11.3 10*3/uL — ABNORMAL HIGH (ref 4.0–10.5)
nRBC: 0 % (ref 0.0–0.2)

## 2023-03-19 LAB — MAGNESIUM: Magnesium: 1.6 mg/dL — ABNORMAL LOW (ref 1.7–2.4)

## 2023-03-19 MED ORDER — SODIUM CHLORIDE 0.9 % IV SOLN
400.0000 mg/m2 | Freq: Once | INTRAVENOUS | Status: AC
  Administered 2023-03-19: 700 mg via INTRAVENOUS
  Filled 2023-03-19: qty 20

## 2023-03-19 MED ORDER — SODIUM CHLORIDE 0.9 % IV SOLN
200.0000 mg | Freq: Once | INTRAVENOUS | Status: AC
  Administered 2023-03-19: 200 mg via INTRAVENOUS
  Filled 2023-03-19: qty 8

## 2023-03-19 MED ORDER — MAGNESIUM SULFATE 2 GM/50ML IV SOLN
2.0000 g | Freq: Once | INTRAVENOUS | Status: AC
  Administered 2023-03-19: 2 g via INTRAVENOUS
  Filled 2023-03-19: qty 50

## 2023-03-19 MED ORDER — HEPARIN SOD (PORK) LOCK FLUSH 100 UNIT/ML IV SOLN
500.0000 [IU] | Freq: Once | INTRAVENOUS | Status: AC | PRN
  Administered 2023-03-19: 500 [IU]

## 2023-03-19 MED ORDER — PROCHLORPERAZINE MALEATE 10 MG PO TABS
10.0000 mg | ORAL_TABLET | Freq: Once | ORAL | Status: AC
  Administered 2023-03-19: 10 mg via ORAL
  Filled 2023-03-19: qty 1

## 2023-03-19 MED ORDER — SODIUM CHLORIDE 0.9% FLUSH
10.0000 mL | INTRAVENOUS | Status: DC | PRN
  Administered 2023-03-19: 10 mL via INTRAVENOUS

## 2023-03-19 MED ORDER — SODIUM CHLORIDE 0.9% FLUSH
10.0000 mL | INTRAVENOUS | Status: DC | PRN
  Administered 2023-03-19: 10 mL

## 2023-03-19 MED ORDER — SODIUM CHLORIDE 0.9 % IV SOLN: Freq: Once | INTRAVENOUS | Status: AC

## 2023-03-19 NOTE — Progress Notes (Signed)
Patient presents today for chemotherapy infusion.  Patient is in satisfactory condition with no new complaints voiced.  Vital signs are stable.  Labs reviewed and all labs are within treatment parameters. Patient will receive 2g IV magnesium sulfate per Dr.Katragadda's standing orders.  We will proceed with treatment per MD orders.    Treatment given today per MD orders. Tolerated infusion without adverse affects. Vital signs stable. No complaints at this time. Discharged from clinic ambulatory in stable condition. Alert and oriented x 3. F/U with [facility] as scheduled.

## 2023-03-19 NOTE — Patient Instructions (Signed)
[facility] [place] - [facility]  Discharge Instructions: Thank you for choosing [facility] to provide your oncology and hematology care.  If you have a lab appointment with the Cancer Center - please note that after April 8th, 2024, all labs will be drawn in the cancer center.  You do not have to check in or register with the main entrance as you have in the past but will complete your check-in in the cancer center.  Wear comfortable clothing and clothing appropriate for easy access to any Portacath or PICC line.   We strive to give you quality time with your provider. You may need to reschedule your appointment if you arrive late (15 or more minutes).  Arriving late affects you and other patients whose appointments are after yours.  Also, if you miss three or more appointments without notifying the office, you may be dismissed from the clinic at the provider's discretion.      For prescription refill requests, have your pharmacy contact our office and allow 72 hours for refills to be completed.    Today you received the following chemotherapy and/or immunotherapy agents Keytruda and Alimta     To help prevent nausea and vomiting after your treatment, we encourage you to take your nausea medication as directed.  Pembrolizumab Injection What is this medication? PEMBROLIZUMAB (PEM broe LIZ ue mab) treats some types of cancer. It works by helping your immune system slow or stop the spread of cancer cells. It is a monoclonal antibody. This medicine may be used for other purposes; ask your health care provider or pharmacist if you have questions. COMMON BRAND NAME(S): Keytruda What should I tell my care team before I take this medication? They need to know if you have any of these conditions: Allogeneic stem cell transplant (uses someone else's stem cells) Autoimmune diseases, such as Crohn disease, ulcerative colitis, lupus History of chest  radiation Nervous system problems, such as Guillain-Barre syndrome, myasthenia gravis Organ transplant An unusual or allergic reaction to pembrolizumab, other medications, foods, dyes, or preservatives Pregnant or trying to get pregnant Breast-feeding How should I use this medication? This medication is injected into a vein. It is given by your care team in a hospital or clinic setting. A special MedGuide will be given to you before each treatment. Be sure to read this information carefully each time. Talk to your care team about the use of this medication in children. While it may be prescribed for children as young as 6 months for selected conditions, precautions do apply. Overdosage: If you think you have taken too much of this medicine contact a poison control center or emergency room at once. NOTE: This medicine is only for you. Do not share this medicine with others. What if I miss a dose? Keep appointments for follow-up doses. It is important not to miss your dose. Call your care team if you are unable to keep an appointment. What may interact with this medication? Interactions have not been studied. This list may not describe all possible interactions. Give your health care provider a list of all the medicines, herbs, non-prescription drugs, or dietary supplements you use. Also tell them if you smoke, drink alcohol, or use illegal drugs. Some items may interact with your medicine. What should I watch for while using this medication? Your condition will be monitored carefully while you are receiving this medication. You may need blood work while taking this medication.  This medication may cause serious skin reactions. They can happen weeks to months after starting the medication. Contact your care team right away if you notice fevers or flu-like symptoms with a rash. The rash may be red or purple and then turn into blisters or peeling of the skin. You may also notice a red rash with  swelling of the face, lips, or lymph nodes in your neck or under your arms. Tell your care team right away if you have any change in your eyesight. Talk to your care team if you may be pregnant. Serious birth defects can occur if you take this medication during pregnancy and for 4 months after the last dose. You will need a negative pregnancy test before starting this medication. Contraception is recommended while taking this medication and for 4 months after the last dose. Your care team can help you find the option that works for you. Do not breastfeed while taking this medication and for 4 months after the last dose. What side effects may I notice from receiving this medication? Side effects that you should report to your care team as soon as possible: Allergic reactions--skin rash, itching, hives, swelling of the face, lips, tongue, or throat Dry cough, shortness of breath or trouble breathing Eye pain, redness, irritation, or discharge with blurry or decreased vision Heart muscle inflammation--unusual weakness or fatigue, shortness of breath, chest pain, fast or irregular heartbeat, dizziness, swelling of the ankles, feet, or hands Hormone gland problems--headache, sensitivity to light, unusual weakness or fatigue, dizziness, fast or irregular heartbeat, increased sensitivity to cold or heat, excessive sweating, constipation, hair loss, increased thirst or amount of urine, tremors or shaking, irritability Infusion reactions--chest pain, shortness of breath or trouble breathing, feeling faint or lightheaded Kidney injury (glomerulonephritis)--decrease in the amount of urine, red or dark brown urine, foamy or bubbly urine, swelling of the ankles, hands, or feet Liver injury--right upper belly pain, loss of appetite, nausea, light-colored stool, dark yellow or brown urine, yellowing skin or eyes, unusual weakness or fatigue Pain, tingling, or numbness in the hands or feet, muscle weakness, change in  vision, confusion or trouble speaking, loss of balance or coordination, trouble walking, seizures Rash, fever, and swollen lymph nodes Redness, blistering, peeling, or loosening of the skin, including inside the mouth Sudden or severe stomach pain, bloody diarrhea, fever, nausea, vomiting Side effects that usually do not require medical attention (report to your care team if they continue or are bothersome): Bone, joint, or muscle pain Diarrhea Fatigue Loss of appetite Nausea Skin rash This list may not describe all possible side effects. Call your doctor for medical advice about side effects. You may report side effects to FDA at 1-800-FDA-1088. Where should I keep my medication? This medication is given in a hospital or clinic. It will not be stored at home. NOTE: This sheet is a summary. It may not cover all possible information. If you have questions about this medicine, talk to your doctor, pharmacist, or health care provider.  2024 Elsevier/Gold Standard (2021-05-31 00:00:00)  Pemetrexed Injection What is this medication? PEMETREXED (PEM e TREX ed) treats some types of cancer. It works by slowing down the growth of cancer cells. This medicine may be used for other purposes; ask your health care provider or pharmacist if you have questions. COMMON BRAND NAME(S): Alimta, PEMFEXY, PEMRYDI RTU What should I tell my care team before I take this medication? They need to know if you have any of these conditions: Infection, such as chickenpox,  cold sores, or herpes Kidney disease Low blood cell levels (white cells, red cells, and platelets) Lung or breathing disease, such as asthma Radiation therapy An unusual or allergic reaction to pemetrexed, other medications, foods, dyes, or preservatives If you or your partner are pregnant or trying to get pregnant Breast-feeding How should I use this medication? This medication is injected into a vein. It is given by your care team in a  hospital or clinic setting. Talk to your care team about the use of this medication in children. Special care may be needed. Overdosage: If you think you have taken too much of this medicine contact a poison control center or emergency room at once. NOTE: This medicine is only for you. Do not share this medicine with others. What if I miss a dose? Keep appointments for follow-up doses. It is important not to miss your dose. Call your care team if you are unable to keep an appointment. What may interact with this medication? Do not take this medication with any of the following: Live virus vaccines This medication may also interact with the following: Ibuprofen This list may not describe all possible interactions. Give your health care provider a list of all the medicines, herbs, non-prescription drugs, or dietary supplements you use. Also tell them if you smoke, drink alcohol, or use illegal drugs. Some items may interact with your medicine. What should I watch for while using this medication? Your condition will be monitored carefully while you are receiving this medication. This medication may make you feel generally unwell. This is not uncommon as chemotherapy can affect healthy cells as well as cancer cells. Report any side effects. Continue your course of treatment even though you feel ill unless your care team tells you to stop. This medication can cause serious side effects. To reduce the risk, your care team may give you other medications to take before receiving this one. Be sure to follow the directions from your care team. This medication can cause a rash or redness in areas of the body that have previously had radiation therapy. If you have had radiation therapy, tell your care team if you notice a rash in this area. This medication may increase your risk of getting an infection. Call your care team for advice if you get a fever, chills, sore throat, or other symptoms of a cold or flu.  Do not treat yourself. Try to avoid being around people who are sick. Be careful brushing or flossing your teeth or using a toothpick because you may get an infection or bleed more easily. If you have any dental work done, tell your dentist you are receiving this medication. Avoid taking medications that contain aspirin, acetaminophen, ibuprofen, naproxen, or ketoprofen unless instructed by your care team. These medications may hide a fever. Check with your care team if you have severe diarrhea, nausea, and vomiting, or if you sweat a lot. The loss of too much body fluid may make it dangerous for you to take this medication. Talk to your care team if you or your partner wish to become pregnant or think either of you might be pregnant. This medication can cause serious birth defects if taken during pregnancy and for 6 months after the last dose. A negative pregnancy test is required before starting this medication. A reliable form of contraception is recommended while taking this medication and for 6 months after the last dose. Talk to your care team about reliable forms of contraception. Do not father a  child while taking this medication and for 3 months after the last dose. Use a condom while having sex during this time period. Do not breastfeed while taking this medication and for 1 week after the last dose. This medication may cause infertility. Talk to your care team if you are concerned about your fertility. What side effects may I notice from receiving this medication? Side effects that you should report to your care team as soon as possible: Allergic reactions--skin rash, itching, hives, swelling of the face, lips, tongue, or throat Dry cough, shortness of breath or trouble breathing Infection--fever, chills, cough, sore throat, wounds that don't heal, pain or trouble when passing urine, general feeling of discomfort or being unwell Kidney injury--decrease in the amount of urine, swelling of the  ankles, hands, or feet Low red blood cell level--unusual weakness or fatigue, dizziness, headache, trouble breathing Redness, blistering, peeling, or loosening of the skin, including inside the mouth Unusual bruising or bleeding Side effects that usually do not require medical attention (report to your care team if they continue or are bothersome): Fatigue Loss of appetite Nausea Vomiting This list may not describe all possible side effects. Call your doctor for medical advice about side effects. You may report side effects to FDA at 1-800-FDA-1088. Where should I keep my medication? This medication is given in a hospital or clinic. It will not be stored at home. NOTE: This sheet is a summary. It may not cover all possible information. If you have questions about this medicine, talk to your doctor, pharmacist, or health care provider.  2024 Elsevier/Gold Standard (2021-05-24 00:00:00)  BELOW ARE SYMPTOMS THAT SHOULD BE REPORTED IMMEDIATELY: *FEVER GREATER THAN 100.4 F (38 C) OR HIGHER *CHILLS OR SWEATING *NAUSEA AND VOMITING THAT IS NOT CONTROLLED WITH YOUR NAUSEA MEDICATION *UNUSUAL SHORTNESS OF BREATH *UNUSUAL BRUISING OR BLEEDING *URINARY PROBLEMS (pain or burning when urinating, or frequent urination) *BOWEL PROBLEMS (unusual diarrhea, constipation, pain near the anus) TENDERNESS IN MOUTH AND THROAT WITH OR WITHOUT PRESENCE OF ULCERS (sore throat, sores in mouth, or a toothache) UNUSUAL RASH, SWELLING OR PAIN  UNUSUAL VAGINAL DISCHARGE OR ITCHING   Items with * indicate a potential emergency and should be followed up as soon as possible or go to the Emergency Department if any problems should occur.  Please show the CHEMOTHERAPY ALERT CARD or IMMUNOTHERAPY ALERT CARD at check-in to the Emergency Department and triage nurse.  Should you have questions after your visit or need to cancel or reschedule your appointment, please contact [facility] [place] - A DEPT OF Eligha Bridegroom  [facility] [phone]  and follow the prompts.  Office hours are 8:00 a.m. to 4:30 p.m. Monday - Friday. Please note that voicemails left after 4:00 p.m. may not be returned until the following business day.  We are closed weekends and major holidays. You have access to a nurse at all times for urgent questions. Please call the main number to the clinic [phone] and follow the prompts.  For any non-urgent questions, you may also contact your provider using MyChart. We now offer e-Visits for anyone 9 and older to request care online for non-urgent symptoms. For details visit [email or web address].   Also download the MyChart app! Go to the app store, search "MyChart", open the app, select [place], and log in with your MyChart username and password.

## 2023-03-24 NOTE — Progress Notes (Signed)
 Updated pemetrexed ERX to [id number]   Pryor Ochoa, PharmD 03/24/23

## 2023-03-27 ENCOUNTER — Other Ambulatory Visit: Payer: Self-pay

## 2023-04-03 ENCOUNTER — Ambulatory Visit (HOSPITAL_COMMUNITY): Payer: Medicaid Other

## 2023-04-05 ENCOUNTER — Other Ambulatory Visit: Payer: Self-pay

## 2023-04-05 DIAGNOSIS — C3491 Malignant neoplasm of unspecified part of right bronchus or lung: Secondary | ICD-10-CM

## 2023-04-09 ENCOUNTER — Inpatient Hospital Stay: Payer: Medicaid Other

## 2023-04-09 ENCOUNTER — Inpatient Hospital Stay: Payer: Medicaid Other | Admitting: Hematology

## 2023-04-11 ENCOUNTER — Inpatient Hospital Stay: Attending: Hematology | Admitting: Licensed Clinical Social Worker

## 2023-04-11 DIAGNOSIS — C3431 Malignant neoplasm of lower lobe, right bronchus or lung: Secondary | ICD-10-CM | POA: Insufficient documentation

## 2023-04-11 DIAGNOSIS — M546 Pain in thoracic spine: Secondary | ICD-10-CM | POA: Insufficient documentation

## 2023-04-11 DIAGNOSIS — Z5112 Encounter for antineoplastic immunotherapy: Secondary | ICD-10-CM | POA: Insufficient documentation

## 2023-04-11 DIAGNOSIS — Z79620 Long term (current) use of immunosuppressive biologic: Secondary | ICD-10-CM | POA: Insufficient documentation

## 2023-04-11 DIAGNOSIS — C7951 Secondary malignant neoplasm of bone: Secondary | ICD-10-CM | POA: Insufficient documentation

## 2023-04-11 DIAGNOSIS — C3491 Malignant neoplasm of unspecified part of right bronchus or lung: Secondary | ICD-10-CM

## 2023-04-11 DIAGNOSIS — Z5111 Encounter for antineoplastic chemotherapy: Secondary | ICD-10-CM | POA: Insufficient documentation

## 2023-04-11 NOTE — Progress Notes (Signed)
 [facility] [address], [place] [address]    Clinic Day:  04/12/2023  Referring physician: Benita Stabile, MD  Patient Care Team: Rachel Stabile, MD as PCP - General (Internal Medicine) Rachel Gross Rachel Friends, MD as Consulting Physician (Gastroenterology) Rachel Massed, MD as Consulting Physician (Medical Oncology)   ASSESSMENT & PLAN:   Assessment: 1.  Metastatic lung adenocarcinoma: -PET scan on 05/13/2019 showed right lower lobe nodule concerning for bronchogenic carcinoma.  Cirrhotic liver. -Right lower lobectomy and lymph node excision on 06/19/2019. -Pathology showed 1.7 cm right lower lobe adenocarcinoma, unifocal, lymphovascular invasion present.  Margins negative.  2/14 lymph nodes positive (level 7 and 10R).  PT1CPN2. -PD-L1 30%, K-ras G12A, MSI stable.  EGFR mutation not identified. -4 cycles of adjuvant carboplatin and pemetrexed from 09/09/2019 through 11/11/2019. - CT chest on 12/29/2021: Lytic metastatic lesion in the posterior left fifth rib.  Interval enlargement of cavitary nodule of the dependent right upper lobe.  Numerous new clustered nodules of varying sizes. - PET scan (01/19/2022): Cavitary nodule of concern in the right middle lobe measures 1.3 x 0.7 cm, SUV max of 1.8.  No other hypermetabolic or suspicious lung nodules.  Expansile lytic metastasis involving left fifth rib hypermetabolic measuring 2.6 x 1.9 cm with SUV 4.2. - Left rib biopsy (02/06/2022): Metastatic moderately to poorly differentiated adenocarcinoma - NGS by [facility]: PD-L1 (22 C3)-TPS 5%.  TMB-high.  K-ras G12 A pathogenic variant.  T p53 pathogenic variant.  Other targetable mutations negative.  MSI-stable. - XRT to the left rib lesion completed - SBRT to the T5 lesion from 06/12/2022 through 06/16/2022 - Cycle 1 of carboplatin, pemetrexed and pembrolizumab on 06/29/2022    Plan: 1.  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 - She received last cycle of Keytruda and pemetrexed on 03/19/2023. - She has become homeless.  She reports difficulty breathing.  She has been off of oxygen for the last few days.  She used nebulizer last night.  O2 sats in our office were 90% on room air.  She also reported that she cannot control bladder at nighttime which was a chronic problem but has become more frequent now.  Denies any dysuria.  Reports cough with greenish expectoration. - Recommend holding treatment today. - Will give albuterol nebulizer and Solu-Medrol 125 mg IV x 1.  Will send her home on Medrol Dosepak and Levaquin 500 milligrams daily for 7 days. - She has a CT scan for restaging scheduled on 04/23/2023. - We will do QuantiFERON gold test and social services consult. - RTC after scan.   2.  Left mid back pain: - Continue tramadol as needed.   3.  Hypomagnesemia: - She is taking magnesium 2 tablets once daily.  Magnesium is low at 1.4 today.  She has not taken any today.    Orders Placed This Encounter  Procedures   Urine culture   QuantiFERON-TB Gold Plus    Standing Status:   Future    Number of Occurrences:   1    Expected Date:   04/12/2023    Expiration Date:   04/10/2024   Urinalysis, Routine w reflex microscopic      I,Rachel Gross,acting as a scribe for Rachel Massed, MD.,have documented all relevant documentation on the behalf of Rachel Massed, MD,as directed by  Rachel Massed, MD while in the  presence of Rachel Massed, MD.  I, Rachel Massed MD, have reviewed the above documentation for accuracy and completeness, and I agree with the above.    Rachel Massed, MD   3/13/202510:06 AM  CHIEF COMPLAINT:   Diagnosis: metastatic right lung cancer    Cancer Staging  Non-small cell cancer of right lung [facility]) Staging form: Lung, AJCC 8th Edition - Clinical stage from 02/13/2022:  Stage IV (cT1c, cN2, pM1) - Signed by Rachel Massed, MD on 02/13/2022    Prior Therapy: 1. Right lower lobectomy on 06/19/2019. 2. Carboplatin and pemetrexed x 4 cycles from 09/09/2019 to 11/11/2019 3. SBRT to left rib/chest wall lesion, completed 03/22/22 4. SBRT to T5 lesion, 06/12/22 - 06/16/22 5. carboplatin, pemetrexed and pembrolizumab, 06/29/22 through 08/30/22  Current Therapy:  pemetrexed and pembrolizumab    HISTORY OF PRESENT ILLNESS:   Oncology History  Adenocarcinoma of lung, stage 3, right (HCC)  07/02/2019 Initial Diagnosis   Adenocarcinoma of lung, stage 3, right (HCC)   07/18/2019 Genetic Testing   Foundation One     07/23/2019 Genetic Testing   PD-L1     09/09/2019 - 11/11/2019 Chemotherapy   Patient is on Treatment Plan : LUNG NSCLC Pemetrexed (Alimta) / Carboplatin q21d x 4 cycles     Non-small cell cancer of right lung (HCC)  02/13/2022 Initial Diagnosis   Non-small cell cancer of right lung (HCC)   02/13/2022 Cancer Staging   Staging form: Lung, AJCC 8th Edition - Clinical stage from 02/13/2022: Stage IV (cT1c, cN2, pM1) - Signed by Rachel Massed, MD on 02/13/2022 Histopathologic type: Adenocarcinoma, NOS Stage prefix: Initial diagnosis Histologic grade (G): G3 Histologic grading system: 4 grade system   06/29/2022 -  Chemotherapy   Patient is on Treatment Plan : LUNG Carboplatin (5) + Pemetrexed (500) + Pembrolizumab (200) D1 q21d Induction x 4 cycles / Maintenance Pemetrexed (500) + Pembrolizumab (200) D1 q21d        INTERVAL HISTORY:   Rachel Gross is a 58 y.o. female presenting to clinic today for follow up of metastatic right lung cancer. She was last seen by me on 02/26/23.  Today, she states that she is doing well overall. Her appetite level is at 0%. Her energy level is at 40%.  Rachel Gross reports SOB and feels as if she is not getting enough air. Oxygen sat today is 90% with cannula air. She no longer has at home oxygen though she did briefly have  it s/p right lower lobectomy in 2021. She is using Symbicort and albuterol nebulizer's at home prn with mild, temporary relief in SOB. Rachel Gross notes a productive cough with green colored phlegm.   Rachel Gross reports urinary incontinence, particularly at night, that is progressively worsening. She is taking 2 pills of Magnesium BID. Thamara is currently living with a friend.   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    Bipolar disorder (HCC)    Cancer (HCC)    right lung   COPD (chronic obstructive pulmonary disease) (HCC)    Depression    Dyspnea    Emphysema lung (HCC)    GERD (gastroesophageal reflux disease)    Hepatitis C    s/p treatment with Epclusa   History of HPV infection    History of pneumonia    Pneumonia  Smoker     Surgical History: Past Surgical History:  Procedure Laterality Date   BIOPSY  05/02/2018   Procedure: BIOPSY;  Surgeon: Corbin Ade, MD;  [facility];  Service: Endoscopy;;  gastric   CESAREAN SECTION     CHEST TUBE INSERTION Right 06/26/2019   Procedure: Right CHEST TUBE REPLACEMENT;  Surgeon: Loreli Slot, MD;  Location: [facility] OR;  Service: Thoracic;  Laterality: Right;   COLONOSCOPY WITH PROPOFOL N/A 03/03/2019   Procedure: COLONOSCOPY WITH PROPOFOL;  Surgeon: Corbin Ade, MD;  [facility];  Service: Endoscopy;  Laterality: N/A;  9:15am   ESOPHAGOGASTRODUODENOSCOPY (EGD) WITH PROPOFOL N/A 05/02/2018   Procedure: ESOPHAGOGASTRODUODENOSCOPY (EGD) WITH PROPOFOL;  Surgeon: Corbin Ade, MD;  [facility];  Service: Endoscopy;  Laterality: N/A;  8:30am   EYE SURGERY Bilateral    cataract   HEMOSTASIS CLIP PLACEMENT  03/03/2019   Procedure: HEMOSTASIS CLIP PLACEMENT;  Surgeon: Corbin Ade, MD;  [facility];  Service:  Endoscopy;;   INTERCOSTAL NERVE BLOCK Right 06/19/2019   Procedure: Intercostal Nerve Block;  Surgeon: Loreli Slot, MD;  Location: [facility] OR;  Service: Thoracic;  Laterality: Right;   IR US GUIDE VASC ACCESS RIGHT  03/13/2018   LOBECTOMY     LYMPH NODE DISSECTION Right 06/19/2019   Procedure: Lymph Node Dissection;  Surgeon: Loreli Slot, MD;  Location: [facility] OR;  Service: Thoracic;  Laterality: Right;   POLYPECTOMY  03/03/2019   Procedure: POLYPECTOMY;  Surgeon: Corbin Ade, MD;  [facility];  Service: Endoscopy;;   PORTACATH PLACEMENT Left 09/08/2019   Procedure: INSERTION PORT-A-CATH LEFT CHEST (attached catheter in left subclavian);  Surgeon: Franky Macho, MD;  Location: [facility];  Service: General;  Laterality: Left;   TONSILLECTOMY     TOOTH EXTRACTION  01/31/2018   x 7   VIDEO BRONCHOSCOPY WITH INSERTION OF INTERBRONCHIAL VALVE (IBV) N/A 06/26/2019   Procedure: VIDEO BRONCHOSCOPY WITH INSERTION OF TWO INTERBRONCHIAL VALVE (IBV);  Surgeon: Loreli Slot, MD;  Location: [facility] OR;  Service: Thoracic;  Laterality: N/A;   VIDEO BRONCHOSCOPY WITH INSERTION OF INTERBRONCHIAL VALVE (IBV) N/A 08/08/2019   Procedure: VIDEO BRONCHOSCOPY WITH REMOVAL OF INTERBRONCHIAL VALVE (IBV);  Surgeon: Loreli Slot, MD;  Location: [facility] OR;  Service: Thoracic;  Laterality: N/A;    Social History: Social History   Socioeconomic History   Marital status: Single    Spouse name: Not on file   Number of children: Not on file   Years of education: Not on file   Highest education level: Not on file  Occupational History   Not on file  Tobacco Use   Smoking status: Former    Current packs/day: 0.00    Average packs/day: 0.5 packs/day for 38.0 years (19.0 ttl pk-yrs)    Types: Cigarettes    Start date: 06/18/1981    Quit date: 06/19/2019    Years since quitting: 3.8   Smokeless tobacco: Never  Vaping Use   Vaping status: Never Used  Substance and Sexual Activity   Alcohol  use: Not Currently    Comment: Last use of alcohol 03/2016   Drug use: Not Currently    Types: 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   Sexual activity: Not Currently  Other Topics Concern   Not on file  Social History Narrative   Not on file   Social Drivers of Health   Financial Resource Strain: Medium Risk (12/10/2019)  Overall Financial Resource Strain (CARDIA)    Difficulty of Paying Living Expenses: Somewhat hard  Food Insecurity: Food Insecurity Present (12/10/2019)   Hunger Vital Sign    Worried About Running Out of Food in the Last Year: Often true    Ran Out of Food in the Last Year: Often true  Transportation Needs: No Transportation Needs (12/10/2019)   PRAPARE - Administrator, Civil Service (Medical): No    Lack of Transportation (Non-Medical): No  Physical Activity: Sufficiently Active (12/10/2019)   Exercise Vital Sign    Days of Exercise per Week: 3 days    Minutes of Exercise per Session: 150+ min  Stress: Stress Concern Present (12/10/2019)   [employer] of Occupational Health - Occupational Stress Questionnaire    Feeling of Stress : Very much  Social Connections: Socially Isolated (12/10/2019)   Social Connection and Isolation Panel [NHANES]    Frequency of Communication with Gross and Family: More than three times a week    Frequency of Social Gatherings with Gross and Family: Once a week    Attends Religious Services: Never    Database administrator or Organizations: No    Attends Banker Meetings: Never    Marital Status: Divorced  Catering manager Violence: Not At Risk (12/10/2019)   Humiliation, Afraid, Rape, and Kick questionnaire    Fear of Current or Ex-Partner: No    Emotionally Abused: No    Physically Abused: No    Sexually Abused: No    Family History: Family History  Problem Relation Age of Onset   Congenital heart disease Mother    Bipolar disorder Mother    Prostate cancer Brother    Alzheimer's disease  Paternal Grandmother    Colon cancer Neg Hx     Current Medications:  Current Outpatient Medications:    albuterol (VENTOLIN HFA) 108 (90 Base) MCG/ACT inhaler, Inhale 2 puffs into the lungs every 4 (four) hours as needed for wheezing or shortness of breath., Disp: 8 g, Rfl: 3   chlorhexidine (PERIDEX) 0.12 % solution, SMARTSIG:By Mouth, Disp: , Rfl:    clindamycin (CLEOCIN) 300 MG capsule, Take 300 mg by mouth 3 (three) times daily., Disp: , Rfl:    dexamethasone (DECADRON) 2 MG tablet, Take 1 tablet (2 mg total) by mouth 2 (two) times daily., Disp: 60 tablet, Rfl: 0   diphenhydrAMINE (BENADRYL) 25 MG tablet, Take 25 mg by mouth at bedtime., Disp: , Rfl:    diphenoxylate-atropine (LOMOTIL) 2.5-0.025 MG tablet, Take 2 tablets by mouth 4 (four) times daily as needed for diarrhea or loose stools., Disp: 240 tablet, Rfl: 0   divalproex (DEPAKOTE) 250 MG DR tablet, Take 250 mg by mouth at bedtime. , Disp: , Rfl:    folic acid (FOLVITE) 1 MG tablet, Take 1 tablet (1 mg total) by mouth daily., Disp: 90 tablet, Rfl: 1   ipratropium-albuterol (DUONEB) 0.5-2.5 (3) MG/3ML SOLN, USE 1 VIAL VIA NEBULIZER EVERY 6 HOURS AS NEEDED, Disp: 360 mL, Rfl: 3   levofloxacin (LEVAQUIN) 500 MG tablet, Take 1 tablet (500 mg total) by mouth daily., Disp: 7 tablet, Rfl: 0   lidocaine-prilocaine (EMLA) cream, Apply a small amount to port a cath site and cover with plastic wrap 1 hour prior to chemotherapy appointments, Disp: 30 g, Rfl: 3   loperamide (IMODIUM) 2 MG capsule, Take 1 capsule (2 mg total) by mouth as needed for diarrhea or loose stools (Take 2 capsiles after the first loose stool and then  1 capsule after each loos stool. Do nt exceed 8 capsules in a 24hour period. If it is bedtime and you are having loose stools, take 2 capsules at bedtime and then take 2 capsules every 4 hours until morning.)., Disp: 30 capsule, Rfl: 0   magnesium oxide (MAG-OX) 400 (240 Mg) MG tablet, Take 1 tablet (400 mg total) by mouth 2  (two) times daily., Disp: 120 tablet, Rfl: 4   methylPREDNISolone (MEDROL DOSEPAK) 4 MG TBPK tablet, Take as directed, Disp: 21 tablet, Rfl: 0   ondansetron (ZOFRAN ODT) 8 MG disintegrating tablet, Place 1 tablet under tongue every 8 hours as needed for nausea. You may begin using this medication on the third day after each chemotherapy treatment., Disp: 20 tablet, Rfl: 1   ondansetron (ZOFRAN) 4 MG tablet, Take 1 tablet (4 mg total) by mouth every 8 (eight) hours as needed for nausea or vomiting., Disp: 20 tablet, Rfl: 0   pantoprazole (PROTONIX) 40 MG tablet, TAKE 1 TABLET BY MOUTH DAILY BEFORE BREAKFAST, Disp: 90 tablet, Rfl: 1   prochlorperazine (COMPAZINE) 10 MG tablet, Take 1 tablet (10 mg total) by mouth every 6 (six) hours as needed for nausea or vomiting., Disp: 60 tablet, Rfl: 5   risperiDONE (RISPERDAL) 2 MG tablet, Take 2 mg by mouth at bedtime. , Disp: , Rfl:    scopolamine (TRANSDERM-SCOP) 1 MG/3DAYS, Place 1 patch (1.5 mg total) onto the skin every 3 (three) days., Disp: 10 patch, Rfl: 12   SYMBICORT 160-4.5 MCG/ACT inhaler, Inhale 2 puffs into the lungs 2 (two) times daily., Disp: 1 each, Rfl: 6   traMADol (ULTRAM) 50 MG tablet, Take 1 tablet (50 mg total) by mouth 2 (two) times daily as needed., Disp: 60 tablet, Rfl: 0   traMADol HCl 100 MG TABS, Take 100 mg by mouth daily as needed., Disp: 30 tablet, Rfl: 0   traZODone (DESYREL) 100 MG tablet, Take by mouth., Disp: , Rfl:    Allergies: Allergies  Allergen Reactions   Folic Acid Anaphylaxis    Not entirely clear if it is from folic acid. She is taking folic acid now and doesn't have the throat swelling anymore   Penicillins Anaphylaxis    Throat swelled Did it involve swelling of the face/tongue/throat, SOB, or low BP? Yes Did it involve sudden or severe rash/hives, skin peeling, or any reaction on the inside of your mouth or nose? No Did you need to seek medical attention at a hospital or doctor's office? No When did it last  happen?      childhood allergy If all above answers are "NO", may proceed with cephalosporin use.    Amoxicillin     hallucinations Did it involve swelling of the face/tongue/throat, SOB, or low BP? No Did it involve sudden or severe rash/hives, skin peeling, or any reaction on the inside of your mouth or nose? No Did you need to seek medical attention at a hospital or doctor's office? Yes When did it last happen?      July 2019 If all above answers are "NO", may proceed with cephalosporin use.    Carboplatin Nausea Only, Other (See Comments) and Cough    Patient complaints of feeling hot and flushed. See progress note from 7/11. Patient able to complete infusion after additional medications given.  Fingers itch, nausea Second reaction on 08/30/2022; included itching of hands and nausea; see progress note from 08/30/2022   Fish Allergy Nausea Only   Other Swelling    Patient states that  the sunrise brand folic acid made her feel like her throat was swelling.     REVIEW OF SYSTEMS:   Review of Systems  Constitutional:  Negative for chills, fatigue and fever.  HENT:   Positive for trouble swallowing. Negative for lump/mass, mouth sores, nosebleeds and sore throat.   Eyes:  Negative for eye problems.  Respiratory:  Positive for cough and shortness of breath.   Cardiovascular:  Positive for chest pain and palpitations. Negative for leg swelling.  Gastrointestinal:  Positive for constipation, nausea and vomiting. Negative for abdominal pain and diarrhea.  Genitourinary:  Positive for bladder incontinence. Negative for difficulty urinating, dysuria, frequency, hematuria and nocturia.   Musculoskeletal:  Positive for back pain (7/10 severity). Negative for arthralgias, flank pain, myalgias and neck pain.  Skin:  Negative for itching and rash.  Neurological:  Positive for headaches and numbness (in hands). Negative for dizziness.  Hematological:  Does not bruise/bleed easily.   Psychiatric/Behavioral:  Positive for depression and sleep disturbance. Negative for suicidal ideas. The patient is nervous/anxious.   All other systems reviewed and are negative.    VITALS:   Weight 142 lb 3.2 oz (64.5 kg).  Wt Readings from Last 3 Encounters:  04/12/23 142 lb 3.2 oz (64.5 kg)  02/26/23 146 lb 9.7 oz (66.5 kg)  02/05/23 145 lb (65.8 kg)    Body mass index is 26.87 kg/m.  Performance status (ECOG): 1 - Symptomatic but completely ambulatory  PHYSICAL EXAM:   Physical Exam Vitals and nursing note reviewed. Exam conducted with a chaperone present.  Constitutional:      Appearance: Normal appearance.  Cardiovascular:     Rate and Rhythm: Normal rate and regular rhythm.     Pulses: Normal pulses.     Heart sounds: Normal heart sounds.  Pulmonary:     Effort: Pulmonary effort is normal.     Breath sounds: Wheezing (bilaterally) present.  Abdominal:     Palpations: Abdomen is soft. There is no hepatomegaly, splenomegaly or mass.     Tenderness: There is no abdominal tenderness.  Musculoskeletal:     Right lower leg: No edema.     Left lower leg: No edema.  Lymphadenopathy:     Cervical: No cervical adenopathy.     Right cervical: No superficial, deep or posterior cervical adenopathy.    Left cervical: No superficial, deep or posterior cervical adenopathy.     Upper Body:     Right upper body: No supraclavicular or axillary adenopathy.     Left upper body: No supraclavicular or axillary adenopathy.  Neurological:     General: No focal deficit present.     Mental Status: She is alert and oriented to person, place, and time.  Psychiatric:        Mood and Affect: Mood normal.        Behavior: Behavior normal.     LABS:   CBC     Component Value Date/Time   WBC 6.5 04/12/2023 0854   RBC 4.30 04/12/2023 0854   HGB 13.9 04/12/2023 0854   HCT 43.9 04/12/2023 0854   PLT 344 04/12/2023 0854   MCV 102.1 (H) 04/12/2023 0854   MCH 32.3 04/12/2023 0854    MCHC 31.7 04/12/2023 0854   RDW 15.9 (H) 04/12/2023 0854   LYMPHSABS 1.0 04/12/2023 0854   MONOABS 0.7 04/12/2023 0854   EOSABS 0.0 04/12/2023 0854   BASOSABS 0.1 04/12/2023 0854    CMP      Component Value Date/Time  NA 138 04/12/2023 0854   K 3.6 04/12/2023 0854   CL 97 (L) 04/12/2023 0854   CO2 29 04/12/2023 0854   GLUCOSE 120 (H) 04/12/2023 0854   BUN <5 (L) 04/12/2023 0854   CREATININE 0.41 (L) 04/12/2023 0854   CREATININE 0.45 (L) 09/10/2017 1422   CALCIUM 8.8 (L) 04/12/2023 0854   PROT 7.7 04/12/2023 0854   ALBUMIN 3.1 (L) 04/12/2023 0854   AST 42 (H) 04/12/2023 0854   ALT 15 04/12/2023 0854   ALKPHOS 65 04/12/2023 0854   BILITOT 0.2 04/12/2023 0854   GFRNONAA >60 04/12/2023 0854   GFRAA >60 10/21/2019 0942     No results found for: "CEA1", "CEA" / No results found for: "CEA1", "CEA" No results found for: "PSA1" No results found for: "ZOX096" No results found for: "CAN125"  No results found for: "TOTALPROTELP", "ALBUMINELP", "A1GS", "A2GS", "BETS", "BETA2SER", "GAMS", "MSPIKE", "SPEI" Lab Results  Component Value Date   TIBC 362 01/17/2018   FERRITIN 136 01/17/2018   IRONPCTSAT 40 (H) 01/17/2018   Lab Results  Component Value Date   LDH 146 05/01/2019   LDH 168 11/28/2017   LDH 185 08/24/2017     STUDIES:   No results found.

## 2023-04-11 NOTE — Progress Notes (Signed)
 [facility] CSW Progress Note  Visual merchandiser  received a message to call pt. Pt reports she has been homeless for 2 weeks.  Per pt her rent was increased and she is now unable to afford it and was evicted.  Pt states she met a man who has been letting her stay in a room in his house.  Pt does have a truck and is able to get to and from appointments.  At pt's last visit it was recommended she get home oxygen.  Per pt she did not follow through due to her situation and her breathing has progressively gotten worse.  Pt also reports that overnight she is incontinent.  Pt has an appointment at the cancer center at 8:45 3/13.  CSW contacted Jeanie Cooks at the paramedicine program who agreed to come to the cancer center with Ether Griffins at 8:45 on 3/13 to assist pt w/ resources.  If possible they will get an application started for ALF w/ pt and assist w/ that placement.    Team informed of the above.  CSW to remain available as appropriate throughout duration of treatment to provide support.      Rachel Moulds, LCSW Clinical Social Worker [facility]    Patient is participating in a Managed Medicaid Plan:  Yes

## 2023-04-12 ENCOUNTER — Inpatient Hospital Stay: Payer: Medicaid Other | Admitting: Hematology

## 2023-04-12 ENCOUNTER — Inpatient Hospital Stay (HOSPITAL_BASED_OUTPATIENT_CLINIC_OR_DEPARTMENT_OTHER): Admitting: Hematology

## 2023-04-12 ENCOUNTER — Inpatient Hospital Stay: Payer: Medicaid Other

## 2023-04-12 ENCOUNTER — Encounter: Payer: Self-pay | Admitting: *Deleted

## 2023-04-12 ENCOUNTER — Other Ambulatory Visit: Payer: Self-pay | Admitting: *Deleted

## 2023-04-12 VITALS — Wt 142.2 lb

## 2023-04-12 VITALS — HR 98

## 2023-04-12 DIAGNOSIS — Z95828 Presence of other vascular implants and grafts: Secondary | ICD-10-CM

## 2023-04-12 DIAGNOSIS — Z902 Acquired absence of lung [part of]: Secondary | ICD-10-CM

## 2023-04-12 DIAGNOSIS — Z111 Encounter for screening for respiratory tuberculosis: Secondary | ICD-10-CM

## 2023-04-12 DIAGNOSIS — M546 Pain in thoracic spine: Secondary | ICD-10-CM | POA: Diagnosis not present

## 2023-04-12 DIAGNOSIS — R911 Solitary pulmonary nodule: Secondary | ICD-10-CM

## 2023-04-12 DIAGNOSIS — C7951 Secondary malignant neoplasm of bone: Secondary | ICD-10-CM | POA: Diagnosis not present

## 2023-04-12 DIAGNOSIS — C3491 Malignant neoplasm of unspecified part of right bronchus or lung: Secondary | ICD-10-CM

## 2023-04-12 DIAGNOSIS — Z79620 Long term (current) use of immunosuppressive biologic: Secondary | ICD-10-CM | POA: Diagnosis not present

## 2023-04-12 DIAGNOSIS — Z5111 Encounter for antineoplastic chemotherapy: Secondary | ICD-10-CM | POA: Diagnosis present

## 2023-04-12 DIAGNOSIS — J449 Chronic obstructive pulmonary disease, unspecified: Secondary | ICD-10-CM

## 2023-04-12 DIAGNOSIS — R0602 Shortness of breath: Secondary | ICD-10-CM

## 2023-04-12 DIAGNOSIS — R300 Dysuria: Secondary | ICD-10-CM | POA: Diagnosis not present

## 2023-04-12 DIAGNOSIS — C3431 Malignant neoplasm of lower lobe, right bronchus or lung: Secondary | ICD-10-CM | POA: Diagnosis present

## 2023-04-12 DIAGNOSIS — Z5112 Encounter for antineoplastic immunotherapy: Secondary | ICD-10-CM | POA: Diagnosis present

## 2023-04-12 LAB — CBC WITH DIFFERENTIAL/PLATELET
Abs Immature Granulocytes: 0.04 10*3/uL (ref 0.00–0.07)
Basophils Absolute: 0.1 10*3/uL (ref 0.0–0.1)
Basophils Relative: 1 %
Eosinophils Absolute: 0 10*3/uL (ref 0.0–0.5)
Eosinophils Relative: 0 %
HCT: 43.9 % (ref 36.0–46.0)
Hemoglobin: 13.9 g/dL (ref 12.0–15.0)
Immature Granulocytes: 1 %
Lymphocytes Relative: 15 %
Lymphs Abs: 1 10*3/uL (ref 0.7–4.0)
MCH: 32.3 pg (ref 26.0–34.0)
MCHC: 31.7 g/dL (ref 30.0–36.0)
MCV: 102.1 fL — ABNORMAL HIGH (ref 80.0–100.0)
Monocytes Absolute: 0.7 10*3/uL (ref 0.1–1.0)
Monocytes Relative: 11 %
Neutro Abs: 4.7 10*3/uL (ref 1.7–7.7)
Neutrophils Relative %: 72 %
Platelets: 344 10*3/uL (ref 150–400)
RBC: 4.3 MIL/uL (ref 3.87–5.11)
RDW: 15.9 % — ABNORMAL HIGH (ref 11.5–15.5)
WBC: 6.5 10*3/uL (ref 4.0–10.5)
nRBC: 0 % (ref 0.0–0.2)

## 2023-04-12 LAB — COMPREHENSIVE METABOLIC PANEL
ALT: 15 U/L (ref 0–44)
AST: 42 U/L — ABNORMAL HIGH (ref 15–41)
Albumin: 3.1 g/dL — ABNORMAL LOW (ref 3.5–5.0)
Alkaline Phosphatase: 65 U/L (ref 38–126)
Anion gap: 12 (ref 5–15)
BUN: <5 mg/dL — ABNORMAL LOW (ref 6–20)
CO2: 29 mmol/L (ref 22–32)
Calcium: 8.8 mg/dL — ABNORMAL LOW (ref 8.9–10.3)
Chloride: 97 mmol/L — ABNORMAL LOW (ref 98–111)
Creatinine, Ser: 0.41 mg/dL — ABNORMAL LOW (ref 0.44–1.00)
GFR, Estimated: >60 mL/min (ref >60)
Glucose, Bld: 120 mg/dL — ABNORMAL HIGH (ref 70–99)
Potassium: 3.6 mmol/L (ref 3.5–5.1)
Sodium: 138 mmol/L (ref 135–145)
Total Bilirubin: 0.2 mg/dL (ref 0.0–1.2)
Total Protein: 7.7 g/dL (ref 6.5–8.1)

## 2023-04-12 LAB — TSH: TSH: 2.06 u[IU]/mL (ref 0.350–4.500)

## 2023-04-12 LAB — URINALYSIS, ROUTINE W REFLEX MICROSCOPIC
Bilirubin Urine: NEGATIVE
Glucose, UA: NEGATIVE mg/dL
Ketones, ur: NEGATIVE mg/dL
Nitrite: POSITIVE — AB
Protein, ur: NEGATIVE mg/dL
Specific Gravity, Urine: 1.013 (ref 1.005–1.030)
WBC, UA: >50 WBC/hpf (ref 0–5)
pH: 5 (ref 5.0–8.0)

## 2023-04-12 LAB — MAGNESIUM: Magnesium: 1.4 mg/dL — ABNORMAL LOW (ref 1.7–2.4)

## 2023-04-12 MED ORDER — SODIUM CHLORIDE FLUSH 0.9 % IV SOLN
10.0000 mL | Freq: Once | INTRAVENOUS | Status: AC
  Administered 2023-04-12: 10 mL via INTRAVENOUS
  Filled 2023-04-12: qty 10

## 2023-04-12 MED ORDER — HEPARIN SOD (PORK) LOCK FLUSH 100 UNIT/ML IV SOLN
500.0000 [IU] | Freq: Once | INTRAVENOUS | Status: AC
  Administered 2023-04-12: 500 [IU] via INTRAVENOUS

## 2023-04-12 MED ORDER — TRAMADOL HCL 50 MG PO TABS: 50.0000 mg | ORAL_TABLET | Freq: Two times a day (BID) | ORAL | 0 refills | Status: DC | PRN

## 2023-04-12 MED ORDER — METHYLPREDNISOLONE 4 MG PO TBPK: ORAL_TABLET | ORAL | 0 refills | Status: DC

## 2023-04-12 MED ORDER — IPRATROPIUM-ALBUTEROL 0.5-2.5 (3) MG/3ML IN SOLN
3.0000 mL | Freq: Once | RESPIRATORY_TRACT | Status: AC
  Administered 2023-04-12: 3 mL via RESPIRATORY_TRACT
  Filled 2023-04-12: qty 3

## 2023-04-12 MED ORDER — SODIUM CHLORIDE 0.9% FLUSH
10.0000 mL | INTRAVENOUS | Status: DC | PRN
  Administered 2023-04-12: 10 mL via INTRAVENOUS

## 2023-04-12 MED ORDER — METHYLPREDNISOLONE SODIUM SUCC 125 MG IJ SOLR
125.0000 mg | Freq: Once | INTRAMUSCULAR | Status: AC
  Administered 2023-04-12: 125 mg via INTRAVENOUS

## 2023-04-12 MED ORDER — LEVOFLOXACIN 500 MG PO TABS: 500.0000 mg | ORAL_TABLET | Freq: Every day | ORAL | 0 refills | Status: DC

## 2023-04-12 NOTE — Progress Notes (Signed)
 Order for O2 per [place] 2-4 liters to keep sat > 92 % sent to [facility] for delivery today.

## 2023-04-12 NOTE — Progress Notes (Signed)
 Hold treatment today per Dr. Ellin Saba.  Patient will receive Solu-medrol 125 mg IV x one dose today and a duo-neb per Dr. Ellin Saba.  UA/UC collected today also.  Patient tolerated well with no complaints voiced.  We will work on getting home oxygen as sats on room air are low.  MD aware.  RN will work on getting this completed.  Patient left via wheelchair in stable condition.

## 2023-04-12 NOTE — Patient Instructions (Signed)
 [facility] at [facility] Discharge Instructions   You were seen and examined today by Dr. Ellin Saba.  He reviewed the results of your lab work which are mostly normal/stable.   We will hold your treatment today.   Return as scheduled.    Thank you for choosing [facility] at [facility] to provide your oncology and hematology care.  To afford each patient quality time with our provider, please arrive at least 15 minutes before your scheduled appointment time.   If you have a lab appointment with the Cancer Center please come in thru the Main Entrance and check in at the main information desk.  You need to re-schedule your appointment should you arrive 10 or more minutes late.  We strive to give you quality time with our providers, and arriving late affects you and other patients whose appointments are after yours.  Also, if you no show three or more times for appointments you may be dismissed from the clinic at the providers discretion.     Again, thank you for choosing [facility].  Our hope is that these requests will decrease the amount of time that you wait before being seen by our physicians.       _____________________________________________________________  Should you have questions after your visit to [facility], please contact our office at [phone] and follow the prompts.  Our office hours are 8:00 a.m. and 4:30 p.m. Monday - Friday.  Please note that voicemails left after 4:00 p.m. may not be returned until the following business day.  We are closed weekends and major holidays.  You do have access to a nurse 24-7, just call the main number to the clinic [phone] and do not press any options, hold on the line and a nurse will answer the phone.    For prescription refill requests, have your pharmacy contact our office and allow 72 hours.    Due to Covid, you will need to wear a mask upon entering  the hospital. If you do not have a mask, a mask will be given to you at the Main Entrance upon arrival. For doctor visits, patients may have 1 support person age 43 or older with them. For treatment visits, patients can not have anyone with them due to social distancing guidelines and our immunocompromised population.

## 2023-04-12 NOTE — Patient Instructions (Signed)

## 2023-04-12 NOTE — Addendum Note (Signed)
 Addended by: Cynda Acres A on: 04/12/2023 12:04 PM   Modules accepted: Orders

## 2023-04-12 NOTE — Progress Notes (Signed)
 6 minute walk performed with patient, as she presents today with profound SOB Room Air: 86%  Walking Room Air: 71%  Walking on 6 L of O2: 95%

## 2023-04-13 ENCOUNTER — Encounter (HOSPITAL_COMMUNITY): Payer: Self-pay

## 2023-04-13 ENCOUNTER — Telehealth: Payer: Self-pay

## 2023-04-13 ENCOUNTER — Ambulatory Visit (HOSPITAL_COMMUNITY)
Admission: RE | Admit: 2023-04-13 | Discharge: 2023-04-13 | Disposition: A | Discharge status: Home / Self Care | Source: Ambulatory Visit | Attending: Hematology | Admitting: Hematology

## 2023-04-13 DIAGNOSIS — C3491 Malignant neoplasm of unspecified part of right bronchus or lung: Secondary | ICD-10-CM

## 2023-04-13 NOTE — Telephone Encounter (Signed)
 Notified Patient of prior authorization approval for Tramadol 50mg  tablets. Medication is approved through 10/13/2023. No other needs or concerns noted at this time.

## 2023-04-14 LAB — URINE CULTURE: Culture: >=100000 — AB

## 2023-04-15 ENCOUNTER — Other Ambulatory Visit: Payer: Self-pay

## 2023-04-16 ENCOUNTER — Other Ambulatory Visit: Payer: Self-pay | Admitting: *Deleted

## 2023-04-16 LAB — QUANTIFERON-TB GOLD PLUS (RQFGPL)
QuantiFERON Mitogen Value: 4.49 [IU]/mL
QuantiFERON Nil Value: 0.04 [IU]/mL
QuantiFERON TB1 Ag Value: 0.05 [IU]/mL
QuantiFERON TB2 Ag Value: 0.05 [IU]/mL

## 2023-04-16 LAB — QUANTIFERON-TB GOLD PLUS: QuantiFERON-TB Gold Plus: NEGATIVE

## 2023-04-16 MED ORDER — NITROFURANTOIN MONOHYD MACRO 100 MG PO CAPS: 100.0000 mg | ORAL_CAPSULE | Freq: Two times a day (BID) | ORAL | 0 refills | Status: DC

## 2023-04-16 NOTE — Progress Notes (Signed)
 Macrobid 100 mg bid sent in for UTI.  Patient notified.

## 2023-04-18 ENCOUNTER — Inpatient Hospital Stay: Admitting: Licensed Clinical Social Worker

## 2023-04-18 ENCOUNTER — Other Ambulatory Visit: Payer: Self-pay

## 2023-04-18 DIAGNOSIS — C3491 Malignant neoplasm of unspecified part of right bronchus or lung: Secondary | ICD-10-CM

## 2023-04-18 NOTE — Progress Notes (Signed)
 [facility] [address] [place] [address]    Clinic Day:  04/19/2023  Referring physician: Benita Stabile, MD  Patient Care Team: Benita Stabile, MD as PCP - General (Internal Medicine) Jena Gauss Gerrit Friends, MD as Consulting Physician (Gastroenterology) Doreatha Massed, MD as Consulting Physician (Medical Oncology)   ASSESSMENT & PLAN:   Assessment: 1.  Metastatic lung adenocarcinoma: -PET scan on 05/13/2019 showed right lower lobe nodule concerning for bronchogenic carcinoma.  Cirrhotic liver. -Right lower lobectomy and lymph node excision on 06/19/2019. -Pathology showed 1.7 cm right lower lobe adenocarcinoma, unifocal, lymphovascular invasion present.  Margins negative.  2/14 lymph nodes positive (level 7 and 10R).  PT1CPN2. -PD-L1 30%, K-ras G12A, MSI stable.  EGFR mutation not identified. -4 cycles of adjuvant carboplatin and pemetrexed from 09/09/2019 through 11/11/2019. - CT chest on 12/29/2021: Lytic metastatic lesion in the posterior left fifth rib.  Interval enlargement of cavitary nodule of the dependent right upper lobe.  Numerous new clustered nodules of varying sizes. - PET scan (01/19/2022): Cavitary nodule of concern in the right middle lobe measures 1.3 x 0.7 cm, SUV max of 1.8.  No other hypermetabolic or suspicious lung nodules.  Expansile lytic metastasis involving left fifth rib hypermetabolic measuring 2.6 x 1.9 cm with SUV 4.2. - Left rib biopsy (02/06/2022): Metastatic moderately to poorly differentiated adenocarcinoma - NGS by [facility]: PD-L1 (22 C3)-TPS 5%.  TMB-high.  K-ras G12 A pathogenic variant.  T p53 pathogenic variant.  Other targetable mutations negative.  MSI-stable. - XRT to the left rib lesion completed - SBRT to the T5 lesion from 06/12/2022 through 06/16/2022 - Cycle 1 of carboplatin, pemetrexed and pembrolizumab on 06/29/2022    Plan: 1.  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 - At last visit last week, she felt short of breath with borderline O2 sats.  We have given antibiotic Levaquin, steroids and nebulizer treatment.  Today her breathing is better.  She missed her CT scan. - She also had UTI with E. coli on 04/12/2023.  She is taking Macrobid which was started about 3 days ago.  She still remains incontinent. - She has become homeless. - Reviewed labs today: Normal LFTs and creatinine.  CBC is normal.  TSH is 2.5. - She may proceed with the treatment today and RTC 3 weeks for follow-up.  CT scan is rescheduled on 05/23/2023.  She will receive B12 injection today. - We have sent a prescription for folic acid which she was not taking.   2.  Left mid back pain: - Continue tramadol as needed.   3.  Hypomagnesemia: - Continue magnesium 2 tablets daily.  Magnesium is normal.    No orders of the defined types were placed in this encounter.     [name],acting as a Neurosurgeon for Doreatha Massed, MD.,have documented all relevant documentation on the behalf of Doreatha Massed, MD,as directed by  Doreatha Massed, MD while in the presence of Doreatha Massed, MD.  I, Doreatha Massed MD, have reviewed the above documentation for accuracy and completeness, and I agree with the above.     Doreatha Massed, MD   3/20/202512:39 PM  CHIEF COMPLAINT:   Diagnosis: metastatic right lung cancer    Cancer Staging  Non-small cell cancer of right lung [facility]) Staging form: Lung, AJCC 8th Edition - Clinical stage from 02/13/2022: Stage IV (  cT1c, cN2, pM1) - Signed by Doreatha Massed, MD on 02/13/2022    Prior Therapy: 1. Right lower lobectomy on 06/19/2019. 2. Carboplatin and pemetrexed x 4 cycles from 09/09/2019 to 11/11/2019 3. SBRT to left rib/chest wall lesion, completed 03/22/22 4. SBRT to T5 lesion, 06/12/22 - 06/16/22 5. carboplatin, pemetrexed and  pembrolizumab, 06/29/22 through 08/30/22  Current Therapy:  pemetrexed and pembrolizumab    HISTORY OF PRESENT ILLNESS:   Oncology History  Adenocarcinoma of lung, stage 3, right (HCC)  07/02/2019 Initial Diagnosis   Adenocarcinoma of lung, stage 3, right (HCC)   07/18/2019 Genetic Testing   Foundation One     07/23/2019 Genetic Testing   PD-L1     09/09/2019 - 11/11/2019 Chemotherapy   Patient is on Treatment Plan : LUNG NSCLC Pemetrexed (Alimta) / Carboplatin q21d x 4 cycles     Non-small cell cancer of right lung (HCC)  02/13/2022 Initial Diagnosis   Non-small cell cancer of right lung (HCC)   02/13/2022 Cancer Staging   Staging form: Lung, AJCC 8th Edition - Clinical stage from 02/13/2022: Stage IV (cT1c, cN2, pM1) - Signed by Doreatha Massed, MD on 02/13/2022 Histopathologic type: Adenocarcinoma, NOS Stage prefix: Initial diagnosis Histologic grade (G): G3 Histologic grading system: 4 grade system   06/29/2022 -  Chemotherapy   Patient is on Treatment Plan : LUNG Carboplatin (5) + Pemetrexed (500) + Pembrolizumab (200) D1 q21d Induction x 4 cycles / Maintenance Pemetrexed (500) + Pembrolizumab (200) D1 q21d        INTERVAL HISTORY:   Rachel Gross is a 58 y.o. female presenting to clinic today for follow up of metastatic right lung cancer. She was last seen by me on 04/12/23.  Today, she states that she is doing well overall. Her appetite level is at 50%. Her energy level is at 25%. Rachel Gross notes breathing has improved since receiving at home oxygen. She reports urinary incontinence has not improved. She has finished Levaquin. She has started Macrobid for her UTI 3 days ago. Rachel Gross is not taking folic acid as she did not have the medicine.   Rachel Gross reports tiredness for 2-3 days after infusion treatment.   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    Bipolar disorder (HCC)    Cancer (HCC)    right lung   COPD (chronic obstructive pulmonary disease) (HCC)    Depression    Dyspnea    Emphysema lung (HCC)    GERD (gastroesophageal reflux disease)    Hepatitis C    s/p treatment with Epclusa   History of HPV infection    History of pneumonia    Pneumonia    Smoker     Surgical History: Past Surgical History:  Procedure Laterality Date   BIOPSY  05/02/2018   Procedure: BIOPSY;  Surgeon: Corbin Ade, MD;  [facility];  Service: Endoscopy;;  gastric   CESAREAN SECTION     CHEST TUBE INSERTION Right 06/26/2019   Procedure: Right CHEST TUBE REPLACEMENT;  Surgeon: Loreli Slot, MD;  Location: [facility] OR;  Service: Thoracic;  Laterality: Right;   COLONOSCOPY WITH PROPOFOL N/A 03/03/2019   Procedure: COLONOSCOPY WITH PROPOFOL;  Surgeon: Corbin Ade, MD;  [facility];  Service: Endoscopy;  Laterality: N/A;  9:15am   ESOPHAGOGASTRODUODENOSCOPY (  EGD) WITH PROPOFOL N/A 05/02/2018   Procedure: ESOPHAGOGASTRODUODENOSCOPY (EGD) WITH PROPOFOL;  Surgeon: Corbin Ade, MD;  [facility];  Service: Endoscopy;  Laterality: N/A;  8:30am   EYE SURGERY Bilateral    cataract   HEMOSTASIS CLIP PLACEMENT  03/03/2019   Procedure: HEMOSTASIS CLIP PLACEMENT;  Surgeon: Corbin Ade, MD;  [facility];  Service: Endoscopy;;   INTERCOSTAL NERVE BLOCK Right 06/19/2019   Procedure: Intercostal Nerve Block;  Surgeon: Loreli Slot, MD;  Location: [facility] OR;  Service: Thoracic;  Laterality: Right;   IR US GUIDE VASC ACCESS RIGHT  03/13/2018   LOBECTOMY     LYMPH NODE DISSECTION Right 06/19/2019   Procedure: Lymph Node Dissection;  Surgeon: Loreli Slot, MD;  Location: [facility] OR;  Service: Thoracic;  Laterality: Right;   POLYPECTOMY  03/03/2019   Procedure: POLYPECTOMY;  Surgeon: Corbin Ade, MD;  [facility];  Service: Endoscopy;;    PORTACATH PLACEMENT Left 09/08/2019   Procedure: INSERTION PORT-A-CATH LEFT CHEST (attached catheter in left subclavian);  Surgeon: Franky Macho, MD;  Location: [facility];  Service: General;  Laterality: Left;   TONSILLECTOMY     TOOTH EXTRACTION  01/31/2018   x 7   VIDEO BRONCHOSCOPY WITH INSERTION OF INTERBRONCHIAL VALVE (IBV) N/A 06/26/2019   Procedure: VIDEO BRONCHOSCOPY WITH INSERTION OF TWO INTERBRONCHIAL VALVE (IBV);  Surgeon: Loreli Slot, MD;  Location[facility] OR;  Service: Thoracic;  Laterality: N/A;   VIDEO BRONCHOSCOPY WITH INSERTION OF INTERBRONCHIAL VALVE (IBV) N/A 08/08/2019   Procedure: VIDEO BRONCHOSCOPY WITH REMOVAL OF INTERBRONCHIAL VALVE (IBV);  Surgeon: Loreli Slot, MD;  Location: [facility] OR;  Service: Thoracic;  Laterality: N/A;    Social History: Social History   Socioeconomic History   Marital status: Single    Spouse name: Not on file   Number of children: Not on file   Years of education: Not on file   Highest education level: Not on file  Occupational History   Not on file  Tobacco Use   Smoking status: Former    Current packs/day: 0.00    Average packs/day: 0.5 packs/day for 38.0 years (19.0 ttl pk-yrs)    Types: Cigarettes    Start date: 06/18/1981    Quit date: 06/19/2019    Years since quitting: 3.8   Smokeless tobacco: Never  Vaping Use   Vaping status: Never Used  Substance and Sexual Activity   Alcohol use: Not Currently    Comment: Last use of alcohol 03/2016   Drug use: Not Currently    Types: 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   Sexual activity: Not Currently  Other Topics Concern   Not on file  Social History Narrative   Not on file   Social Drivers of Health   Financial Resource Strain: Medium Risk (12/10/2019)   Overall Financial Resource Strain (CARDIA)    Difficulty of Paying Living Expenses: Somewhat hard  Food Insecurity: Food Insecurity Present (12/10/2019)   Hunger Vital Sign    Worried About Running Out of Food in the Last  Year: Often true    Ran Out of Food in the Last Year: Often true  Transportation Needs: No Transportation Needs (12/10/2019)   PRAPARE - Administrator, Civil Service (Medical): No    Lack of Transportation (Non-Medical): No  Physical Activity: Sufficiently Active (12/10/2019)   Exercise Vital Sign    Days of Exercise per Week: 3 days    Minutes of Exercise  per Session: 150+ min  Stress: Stress Concern Present (12/10/2019)   [employer] of Occupational Health - Occupational Stress Questionnaire    Feeling of Stress : Very much  Social Connections: Socially Isolated (12/10/2019)   Social Connection and Isolation Panel [NHANES]    Frequency of Communication with Friends and Family: More than three times a week    Frequency of Social Gatherings with Friends and Family: Once a week    Attends Religious Services: Never    Database administrator or Organizations: No    Attends Banker Meetings: Never    Marital Status: Divorced  Catering manager Violence: Not At Risk (12/10/2019)   Humiliation, Afraid, Rape, and Kick questionnaire    Fear of Current or Ex-Partner: No    Emotionally Abused: No    Physically Abused: No    Sexually Abused: No    Family History: Family History  Problem Relation Age of Onset   Congenital heart disease Mother    Bipolar disorder Mother    Prostate cancer Brother    Alzheimer's disease Paternal Grandmother    Colon cancer Neg Hx     Current Medications:  Current Outpatient Medications:    folic acid (FOLVITE) 1 MG tablet, Take 1 tablet (1 mg total) by mouth daily., Disp: 90 tablet, Rfl: 2   nitrofurantoin, macrocrystal-monohydrate, (MACROBID) 100 MG capsule, Take 1 capsule (100 mg total) by mouth 2 (two) times daily., Disp: 14 capsule, Rfl: 0   albuterol (VENTOLIN HFA) 108 (90 Base) MCG/ACT inhaler, Inhale 2 puffs into the lungs every 4 (four) hours as needed for wheezing or shortness of breath., Disp: 8 g, Rfl: 3    dexamethasone (DECADRON) 2 MG tablet, Take 1 tablet (2 mg total) by mouth 2 (two) times daily., Disp: 60 tablet, Rfl: 0   diphenoxylate-atropine (LOMOTIL) 2.5-0.025 MG tablet, Take 2 tablets by mouth 4 (four) times daily as needed for diarrhea or loose stools., Disp: 240 tablet, Rfl: 0   divalproex (DEPAKOTE) 250 MG DR tablet, Take 250 mg by mouth at bedtime. , Disp: , Rfl:    ipratropium-albuterol (DUONEB) 0.5-2.5 (3) MG/3ML SOLN, USE 1 VIAL VIA NEBULIZER EVERY 6 HOURS AS NEEDED, Disp: 360 mL, Rfl: 3   levofloxacin (LEVAQUIN) 500 MG tablet, Take 1 tablet (500 mg total) by mouth daily., Disp: 7 tablet, Rfl: 0   lidocaine-prilocaine (EMLA) cream, Apply a small amount to port a cath site and cover with plastic wrap 1 hour prior to chemotherapy appointments, Disp: 30 g, Rfl: 3   loperamide (IMODIUM) 2 MG capsule, Take 1 capsule (2 mg total) by mouth as needed for diarrhea or loose stools (Take 2 capsiles after the first loose stool and then 1 capsule after each loos stool. Do nt exceed 8 capsules in a 24hour period. If it is bedtime and you are having loose stools, take 2 capsules at bedtime and then take 2 capsules every 4 hours until morning.)., Disp: 30 capsule, Rfl: 0   magnesium oxide (MAG-OX) 400 (240 Mg) MG tablet, Take 1 tablet (400 mg total) by mouth 2 (two) times daily., Disp: 120 tablet, Rfl: 4   ondansetron (ZOFRAN) 4 MG tablet, Take 1 tablet (4 mg total) by mouth every 8 (eight) hours as needed for nausea or vomiting., Disp: 20 tablet, Rfl: 0   pantoprazole (PROTONIX) 40 MG tablet, TAKE 1 TABLET BY MOUTH DAILY BEFORE BREAKFAST, Disp: 90 tablet, Rfl: 1   prochlorperazine (COMPAZINE) 10 MG tablet, Take 1 tablet (10 mg total) by  mouth every 6 (six) hours as needed for nausea or vomiting., Disp: 60 tablet, Rfl: 5   risperiDONE (RISPERDAL) 2 MG tablet, Take 2 mg by mouth at bedtime. , Disp: , Rfl:    SYMBICORT 160-4.5 MCG/ACT inhaler, Inhale 2 puffs into the lungs 2 (two) times daily., Disp: 1 each,  Rfl: 6   traMADol (ULTRAM) 50 MG tablet, Take 1 tablet (50 mg total) by mouth 2 (two) times daily as needed., Disp: 60 tablet, Rfl: 0   traZODone (DESYREL) 100 MG tablet, Take by mouth., Disp: , Rfl:  No current facility-administered medications for this visit.  Facility-Administered Medications Ordered in Other Visits:    sodium chloride flush (NS) 0.9 % injection 10 mL, 10 mL, Intracatheter, PRN, Doreatha Massed, MD, 10 mL at 04/19/23 1141   Allergies: Allergies  Allergen Reactions   Folic Acid Anaphylaxis    Not entirely clear if it is from folic acid. She is taking folic acid now and doesn't have the throat swelling anymore   Penicillins Anaphylaxis    Throat swelled Did it involve swelling of the face/tongue/throat, SOB, or low BP? Yes Did it involve sudden or severe rash/hives, skin peeling, or any reaction on the inside of your mouth or nose? No Did you need to seek medical attention at a hospital or doctor's office? No When did it last happen?      childhood allergy If all above answers are "NO", may proceed with cephalosporin use.    Amoxicillin     hallucinations Did it involve swelling of the face/tongue/throat, SOB, or low BP? No Did it involve sudden or severe rash/hives, skin peeling, or any reaction on the inside of your mouth or nose? No Did you need to seek medical attention at a hospital or doctor's office? Yes When did it last happen?      July 2019 If all above answers are "NO", may proceed with cephalosporin use.    Carboplatin Nausea Only, Other (See Comments) and Cough    Patient complaints of feeling hot and flushed. See progress note from 7/11. Patient able to complete infusion after additional medications given.  Fingers itch, nausea Second reaction on 08/30/2022; included itching of hands and nausea; see progress note from 08/30/2022   Fish Allergy Nausea Only   Other Swelling    Patient states that the sunrise brand folic acid made her feel like her  throat was swelling.     REVIEW OF SYSTEMS:   Review of Systems  Constitutional:  Negative for chills, fatigue and fever.  HENT:   Positive for trouble swallowing. Negative for lump/mass, mouth sores, nosebleeds and sore throat.   Eyes:  Negative for eye problems.  Respiratory:  Positive for shortness of breath (with exertion). Negative for cough.   Cardiovascular:  Negative for chest pain, leg swelling and palpitations.  Gastrointestinal:  Positive for constipation, diarrhea, nausea and vomiting. Negative for abdominal pain.  Genitourinary:  Negative for bladder incontinence, difficulty urinating, dysuria, frequency, hematuria and nocturia.   Musculoskeletal:  Positive for back pain (chronic, 7/10 severity). Negative for arthralgias, flank pain, myalgias and neck pain.  Skin:  Negative for itching and rash.  Neurological:  Positive for dizziness, headaches and numbness (in hands and feet).  Hematological:  Does not bruise/bleed easily.  Psychiatric/Behavioral:  Positive for sleep disturbance. Negative for depression and suicidal ideas. The patient is not nervous/anxious.   All other systems reviewed and are negative.    VITALS:   There were no vitals taken for  this visit.  Wt Readings from Last 3 Encounters:  04/19/23 142 lb 11.2 oz (64.7 kg)  04/12/23 142 lb 3.2 oz (64.5 kg)  02/26/23 146 lb 9.7 oz (66.5 kg)    There is no height or weight on file to calculate BMI.  Performance status (ECOG): 1 - Symptomatic but completely ambulatory  PHYSICAL EXAM:   Physical Exam Vitals and nursing note reviewed. Exam conducted with a chaperone present.  Constitutional:      Appearance: Normal appearance.  Cardiovascular:     Rate and Rhythm: Normal rate and regular rhythm.     Pulses: Normal pulses.     Heart sounds: Normal heart sounds.  Pulmonary:     Effort: Pulmonary effort is normal.     Breath sounds: Normal breath sounds.  Abdominal:     Palpations: Abdomen is soft. There is  no hepatomegaly, splenomegaly or mass.     Tenderness: There is no abdominal tenderness.  Musculoskeletal:     Right lower leg: No edema.     Left lower leg: No edema.  Lymphadenopathy:     Cervical: No cervical adenopathy.     Right cervical: No superficial, deep or posterior cervical adenopathy.    Left cervical: No superficial, deep or posterior cervical adenopathy.     Upper Body:     Right upper body: No supraclavicular or axillary adenopathy.     Left upper body: No supraclavicular or axillary adenopathy.  Neurological:     General: No focal deficit present.     Mental Status: She is alert and oriented to person, place, and time.  Psychiatric:        Mood and Affect: Mood normal.        Behavior: Behavior normal.     LABS:   CBC     Component Value Date/Time   WBC 7.1 04/19/2023 0904   RBC 4.32 04/19/2023 0904   HGB 13.8 04/19/2023 0904   HCT 43.7 04/19/2023 0904   PLT 160 04/19/2023 0904   MCV 101.2 (H) 04/19/2023 0904   MCH 31.9 04/19/2023 0904   MCHC 31.6 04/19/2023 0904   RDW 15.3 04/19/2023 0904   LYMPHSABS 0.8 04/19/2023 0904   MONOABS 0.8 04/19/2023 0904   EOSABS 0.1 04/19/2023 0904   BASOSABS 0.0 04/19/2023 0904    CMP      Component Value Date/Time   NA 136 04/19/2023 0904   K 3.8 04/19/2023 0904   CL 96 (L) 04/19/2023 0904   CO2 28 04/19/2023 0904   GLUCOSE 156 (H) 04/19/2023 0904   BUN 8 04/19/2023 0904   CREATININE 0.52 04/19/2023 0904   CREATININE 0.45 (L) 09/10/2017 1422   CALCIUM 9.0 04/19/2023 0904   PROT 7.6 04/19/2023 0904   ALBUMIN 3.5 04/19/2023 0904   AST 31 04/19/2023 0904   ALT 15 04/19/2023 0904   ALKPHOS 64 04/19/2023 0904   BILITOT 0.7 04/19/2023 0904   GFRNONAA >60 04/19/2023 0904   GFRAA >60 10/21/2019 0942     No results found for: "CEA1", "CEA" / No results found for: "CEA1", "CEA" No results found for: "PSA1" No results found for: "ZOX096" No results found for: "CAN125"  No results found for: "TOTALPROTELP",  "ALBUMINELP", "A1GS", "A2GS", "BETS", "BETA2SER", "GAMS", "MSPIKE", "SPEI" Lab Results  Component Value Date   TIBC 362 01/17/2018   FERRITIN 136 01/17/2018   IRONPCTSAT 40 (H) 01/17/2018   Lab Results  Component Value Date   LDH 146 05/01/2019   LDH 168 11/28/2017   LDH  185 08/24/2017     STUDIES:   No results found.

## 2023-04-18 NOTE — Progress Notes (Signed)
 [facility] CSW Progress Note  Clinical Child psychotherapist contacted patient by phone to check in.  Pt states she did speak with the paramedicine team who offered to assist pt with moving into assisted living.  Pt declined.  At this time she does not wish for her check to go to a facility.  Pt reports that the paramedicine team did give her information on some apartments that may become available in a couple of months.  At this time pt states she is able to continue to stay where she is and will try to wait for an apartment.  Pt inquired about her CAT scan appt as she states her breathing is getting worse.  CSW inquired if pt is using her oxygen.  Pt states she uses it overnight, but not during the day as the tanks scare her.  CSW encouraged pt to use the oxygen as much as possible as it will help with her breathing.  Message sent to medical team to clarify.  CSW to remain available as appropriate to provide support.     Rachel Moulds, LCSW Clinical Social Worker [facility]    Patient is participating in a Managed Medicaid Plan:  Yes

## 2023-04-19 ENCOUNTER — Inpatient Hospital Stay (HOSPITAL_BASED_OUTPATIENT_CLINIC_OR_DEPARTMENT_OTHER): Admitting: Hematology

## 2023-04-19 ENCOUNTER — Inpatient Hospital Stay

## 2023-04-19 VITALS — BP 116/57 | HR 102 | Temp 96.4°F | Resp 20 | Wt 142.7 lb

## 2023-04-19 VITALS — BP 118/70 | HR 95 | Temp 97.2°F | Resp 20

## 2023-04-19 DIAGNOSIS — C3491 Malignant neoplasm of unspecified part of right bronchus or lung: Secondary | ICD-10-CM

## 2023-04-19 DIAGNOSIS — Z5112 Encounter for antineoplastic immunotherapy: Secondary | ICD-10-CM | POA: Diagnosis not present

## 2023-04-19 DIAGNOSIS — Z95828 Presence of other vascular implants and grafts: Secondary | ICD-10-CM

## 2023-04-19 LAB — COMPREHENSIVE METABOLIC PANEL
ALT: 15 U/L (ref 0–44)
AST: 31 U/L (ref 15–41)
Albumin: 3.5 g/dL (ref 3.5–5.0)
Alkaline Phosphatase: 64 U/L (ref 38–126)
Anion gap: 12 (ref 5–15)
BUN: 8 mg/dL (ref 6–20)
CO2: 28 mmol/L (ref 22–32)
Calcium: 9 mg/dL (ref 8.9–10.3)
Chloride: 96 mmol/L — ABNORMAL LOW (ref 98–111)
Creatinine, Ser: 0.52 mg/dL (ref 0.44–1.00)
GFR, Estimated: >60 mL/min (ref >60)
Glucose, Bld: 156 mg/dL — ABNORMAL HIGH (ref 70–99)
Potassium: 3.8 mmol/L (ref 3.5–5.1)
Sodium: 136 mmol/L (ref 135–145)
Total Bilirubin: 0.7 mg/dL (ref 0.0–1.2)
Total Protein: 7.6 g/dL (ref 6.5–8.1)

## 2023-04-19 LAB — CBC WITH DIFFERENTIAL/PLATELET
Abs Immature Granulocytes: 0.03 10*3/uL (ref 0.00–0.07)
Basophils Absolute: 0 10*3/uL (ref 0.0–0.1)
Basophils Relative: 0 %
Eosinophils Absolute: 0.1 10*3/uL (ref 0.0–0.5)
Eosinophils Relative: 1 %
HCT: 43.7 % (ref 36.0–46.0)
Hemoglobin: 13.8 g/dL (ref 12.0–15.0)
Immature Granulocytes: 0 %
Lymphocytes Relative: 11 %
Lymphs Abs: 0.8 10*3/uL (ref 0.7–4.0)
MCH: 31.9 pg (ref 26.0–34.0)
MCHC: 31.6 g/dL (ref 30.0–36.0)
MCV: 101.2 fL — ABNORMAL HIGH (ref 80.0–100.0)
Monocytes Absolute: 0.8 10*3/uL (ref 0.1–1.0)
Monocytes Relative: 11 %
Neutro Abs: 5.4 10*3/uL (ref 1.7–7.7)
Neutrophils Relative %: 77 %
Platelets: 160 10*3/uL (ref 150–400)
RBC: 4.32 MIL/uL (ref 3.87–5.11)
RDW: 15.3 % (ref 11.5–15.5)
WBC: 7.1 10*3/uL (ref 4.0–10.5)
nRBC: 0 % (ref 0.0–0.2)

## 2023-04-19 LAB — TSH: TSH: 2.533 u[IU]/mL (ref 0.350–4.500)

## 2023-04-19 LAB — MAGNESIUM: Magnesium: 1.9 mg/dL (ref 1.7–2.4)

## 2023-04-19 MED ORDER — HEPARIN SOD (PORK) LOCK FLUSH 100 UNIT/ML IV SOLN
500.0000 [IU] | Freq: Once | INTRAVENOUS | Status: AC | PRN
  Administered 2023-04-19: 500 [IU]

## 2023-04-19 MED ORDER — SODIUM CHLORIDE 0.9 % IV SOLN: Freq: Once | INTRAVENOUS | Status: AC

## 2023-04-19 MED ORDER — SODIUM CHLORIDE 0.9% FLUSH
10.0000 mL | INTRAVENOUS | Status: DC | PRN
  Administered 2023-04-19: 10 mL

## 2023-04-19 MED ORDER — SODIUM CHLORIDE FLUSH 0.9 % IV SOLN
10.0000 mL | Freq: Once | INTRAVENOUS | Status: AC
Start: 2023-04-19 — End: 2023-04-19
  Administered 2023-04-19: 10 mL via INTRAVENOUS
  Filled 2023-04-19: qty 10

## 2023-04-19 MED ORDER — PROCHLORPERAZINE MALEATE 10 MG PO TABS
10.0000 mg | ORAL_TABLET | Freq: Once | ORAL | Status: AC
  Administered 2023-04-19: 10 mg via ORAL
  Filled 2023-04-19: qty 1

## 2023-04-19 MED ORDER — SODIUM CHLORIDE 0.9 % IV SOLN
200.0000 mg | Freq: Once | INTRAVENOUS | Status: AC
  Administered 2023-04-19: 200 mg via INTRAVENOUS
  Filled 2023-04-19: qty 8

## 2023-04-19 MED ORDER — FOLIC ACID 1 MG PO TABS: 1.0000 mg | ORAL_TABLET | Freq: Every day | ORAL | 2 refills | Status: DC

## 2023-04-19 MED ORDER — CYANOCOBALAMIN 1000 MCG/ML IJ SOLN
1000.0000 ug | Freq: Once | INTRAMUSCULAR | Status: AC
  Administered 2023-04-19: 1000 ug via INTRAMUSCULAR
  Filled 2023-04-19: qty 1

## 2023-04-19 MED ORDER — SODIUM CHLORIDE 0.9 % IV SOLN
400.0000 mg/m2 | Freq: Once | INTRAVENOUS | Status: AC
Start: 2023-04-19 — End: 2023-04-19
  Administered 2023-04-19: 700 mg via INTRAVENOUS
  Filled 2023-04-19: qty 20

## 2023-04-19 NOTE — Progress Notes (Signed)
 Patient has been examined by Dr. Ellin Saba. Vital signs and labs have been reviewed by MD - ANC, Creatinine, LFTs, hemoglobin, and platelets are within treatment parameters per M.D. - pt may proceed with treatment.  Primary RN and pharmacy notified.

## 2023-04-19 NOTE — Progress Notes (Signed)

## 2023-04-19 NOTE — Patient Instructions (Signed)

## 2023-04-19 NOTE — Patient Instructions (Signed)
 [facility] [place] [facility]  Discharge Instructions: Thank you for choosing [facility] to provide your oncology and hematology care.  If you have a lab appointment with the Cancer Center - please note that after April 8th, 2024, all labs will be drawn in the cancer center.  You do not have to check in or register with the main entrance as you have in the past but will complete your check-in in the cancer center.  Wear comfortable clothing and clothing appropriate for easy access to any Portacath or PICC line.   We strive to give you quality time with your provider. You may need to reschedule your appointment if you arrive late (15 or more minutes).  Arriving late affects you and other patients whose appointments are after yours.  Also, if you miss three or more appointments without notifying the office, you may be dismissed from the clinic at the provider's discretion.      For prescription refill requests, have your pharmacy contact our office and allow 72 hours for refills to be completed.    Today you received the following chemotherapy and/or immunotherapy agents keytruda and alimtga.       To help prevent nausea and vomiting after your treatment, we encourage you to take your nausea medication as directed.  BELOW ARE SYMPTOMS THAT SHOULD BE REPORTED IMMEDIATELY: *FEVER GREATER THAN 100.4 F (38 C) OR HIGHER *CHILLS OR SWEATING *NAUSEA AND VOMITING THAT IS NOT CONTROLLED WITH YOUR NAUSEA MEDICATION *UNUSUAL SHORTNESS OF BREATH *UNUSUAL BRUISING OR BLEEDING *URINARY PROBLEMS (pain or burning when urinating, or frequent urination) *BOWEL PROBLEMS (unusual diarrhea, constipation, pain near the anus) TENDERNESS IN MOUTH AND THROAT WITH OR WITHOUT PRESENCE OF ULCERS (sore throat, sores in mouth, or a toothache) UNUSUAL RASH, SWELLING OR PAIN  UNUSUAL VAGINAL DISCHARGE OR ITCHING   Items with * indicate a potential emergency and should  be followed up as soon as possible or go to the Emergency Department if any problems should occur.  Please show the CHEMOTHERAPY ALERT CARD or IMMUNOTHERAPY ALERT CARD at check-in to the Emergency Department and triage nurse.  Should you have questions after your visit or need to cancel or reschedule your appointment, please contact [facility] [place] - A DEPT OF Eligha Bridegroom [facility] [phone]  and follow the prompts.  Office hours are 8:00 a.m. to 4:30 p.m. Monday - Friday. Please note that voicemails left after 4:00 p.m. may not be returned until the following business day.  We are closed weekends and major holidays. You have access to a nurse at all times for urgent questions. Please call the main number to the clinic [phone] and follow the prompts.  For any non-urgent questions, you may also contact your provider using MyChart. We now offer e-Visits for anyone 43 and older to request care online for non-urgent symptoms. For details visit [email or web address].   Also download the MyChart app! Go to the app store, search "MyChart", open the app, select [place], and log in with your MyChart username and password.

## 2023-04-20 ENCOUNTER — Other Ambulatory Visit: Payer: Self-pay

## 2023-04-23 ENCOUNTER — Ambulatory Visit (HOSPITAL_COMMUNITY)

## 2023-04-24 ENCOUNTER — Other Ambulatory Visit (HOSPITAL_COMMUNITY)

## 2023-04-30 ENCOUNTER — Other Ambulatory Visit: Payer: Self-pay | Admitting: *Deleted

## 2023-04-30 ENCOUNTER — Inpatient Hospital Stay

## 2023-04-30 DIAGNOSIS — R300 Dysuria: Secondary | ICD-10-CM

## 2023-04-30 DIAGNOSIS — Z5112 Encounter for antineoplastic immunotherapy: Secondary | ICD-10-CM | POA: Diagnosis not present

## 2023-05-01 LAB — URINE CULTURE

## 2023-05-03 ENCOUNTER — Inpatient Hospital Stay

## 2023-05-03 ENCOUNTER — Inpatient Hospital Stay: Admitting: Hematology

## 2023-05-10 ENCOUNTER — Other Ambulatory Visit (HOSPITAL_COMMUNITY): Payer: Self-pay

## 2023-05-10 ENCOUNTER — Other Ambulatory Visit: Payer: Self-pay | Admitting: Hematology

## 2023-05-10 ENCOUNTER — Inpatient Hospital Stay

## 2023-05-10 ENCOUNTER — Other Ambulatory Visit: Payer: Self-pay

## 2023-05-10 ENCOUNTER — Inpatient Hospital Stay: Attending: Hematology

## 2023-05-10 VITALS — BP 93/63 | HR 85 | Temp 97.8°F | Resp 18

## 2023-05-10 DIAGNOSIS — C3431 Malignant neoplasm of lower lobe, right bronchus or lung: Secondary | ICD-10-CM | POA: Diagnosis present

## 2023-05-10 DIAGNOSIS — C3491 Malignant neoplasm of unspecified part of right bronchus or lung: Secondary | ICD-10-CM

## 2023-05-10 DIAGNOSIS — Z5111 Encounter for antineoplastic chemotherapy: Secondary | ICD-10-CM | POA: Insufficient documentation

## 2023-05-10 DIAGNOSIS — R197 Diarrhea, unspecified: Secondary | ICD-10-CM

## 2023-05-10 LAB — CBC WITH DIFFERENTIAL/PLATELET
Abs Immature Granulocytes: 0.17 10*3/uL — ABNORMAL HIGH (ref 0.00–0.07)
Basophils Absolute: 0.1 10*3/uL (ref 0.0–0.1)
Basophils Relative: 1 %
Eosinophils Absolute: 0.1 10*3/uL (ref 0.0–0.5)
Eosinophils Relative: 1 %
HCT: 39.6 % (ref 36.0–46.0)
Hemoglobin: 12.9 g/dL (ref 12.0–15.0)
Immature Granulocytes: 2 %
Lymphocytes Relative: 16 %
Lymphs Abs: 1.5 10*3/uL (ref 0.7–4.0)
MCH: 32.7 pg (ref 26.0–34.0)
MCHC: 32.6 g/dL (ref 30.0–36.0)
MCV: 100.5 fL — ABNORMAL HIGH (ref 80.0–100.0)
Monocytes Absolute: 0.9 10*3/uL (ref 0.1–1.0)
Monocytes Relative: 10 %
Neutro Abs: 6.4 10*3/uL (ref 1.7–7.7)
Neutrophils Relative %: 70 %
Platelets: 260 10*3/uL (ref 150–400)
RBC: 3.94 MIL/uL (ref 3.87–5.11)
RDW: 16.3 % — ABNORMAL HIGH (ref 11.5–15.5)
WBC: 9 10*3/uL (ref 4.0–10.5)
nRBC: 0 % (ref 0.0–0.2)

## 2023-05-10 LAB — COMPREHENSIVE METABOLIC PANEL WITH GFR
ALT: 33 U/L (ref 0–44)
AST: 45 U/L — ABNORMAL HIGH (ref 15–41)
Albumin: 2.9 g/dL — ABNORMAL LOW (ref 3.5–5.0)
Alkaline Phosphatase: 76 U/L (ref 38–126)
Anion gap: 12 (ref 5–15)
BUN: 6 mg/dL (ref 6–20)
CO2: 29 mmol/L (ref 22–32)
Calcium: 9.1 mg/dL (ref 8.9–10.3)
Chloride: 94 mmol/L — ABNORMAL LOW (ref 98–111)
Creatinine, Ser: 0.44 mg/dL (ref 0.44–1.00)
GFR, Estimated: >60 mL/min
Glucose, Bld: 93 mg/dL (ref 70–99)
Potassium: 3.5 mmol/L (ref 3.5–5.1)
Sodium: 135 mmol/L (ref 135–145)
Total Bilirubin: 0.5 mg/dL (ref 0.0–1.2)
Total Protein: 6.6 g/dL (ref 6.5–8.1)

## 2023-05-10 LAB — MAGNESIUM: Magnesium: 1.3 mg/dL — ABNORMAL LOW (ref 1.7–2.4)

## 2023-05-10 MED ORDER — SODIUM CHLORIDE 0.9% FLUSH
10.0000 mL | Freq: Once | INTRAVENOUS | Status: AC
  Administered 2023-05-10: 10 mL via INTRAVENOUS

## 2023-05-10 MED ORDER — LOPERAMIDE HCL 2 MG PO CAPS: 2.0000 mg | ORAL_CAPSULE | ORAL | 1 refills | Status: DC | PRN

## 2023-05-10 MED ORDER — SODIUM CHLORIDE 0.9 % IV SOLN: Freq: Once | INTRAVENOUS | Status: AC

## 2023-05-10 MED ORDER — MAGNESIUM SULFATE 4 GM/100ML IV SOLN
4.0000 g | Freq: Once | INTRAVENOUS | Status: AC
  Administered 2023-05-10: 4 g via INTRAVENOUS
  Filled 2023-05-10: qty 100

## 2023-05-10 MED ORDER — PROCHLORPERAZINE MALEATE 10 MG PO TABS
10.0000 mg | ORAL_TABLET | Freq: Once | ORAL | Status: AC
  Administered 2023-05-10: 10 mg via ORAL
  Filled 2023-05-10: qty 1

## 2023-05-10 MED ORDER — CYANOCOBALAMIN 1000 MCG/ML IJ SOLN: 1000.0000 ug | Freq: Once | INTRAMUSCULAR | Status: DC

## 2023-05-10 MED ORDER — DIPHENOXYLATE-ATROPINE 2.5-0.025 MG PO TABS: 2.0000 | ORAL_TABLET | Freq: Four times a day (QID) | ORAL | 0 refills | Status: AC | PRN

## 2023-05-10 MED ORDER — SODIUM CHLORIDE 0.9 % IV SOLN
400.0000 mg/m2 | Freq: Once | INTRAVENOUS | Status: AC
  Administered 2023-05-10: 700 mg via INTRAVENOUS
  Filled 2023-05-10: qty 20

## 2023-05-10 MED ORDER — SODIUM CHLORIDE 0.9% FLUSH
10.0000 mL | INTRAVENOUS | Status: DC | PRN
  Administered 2023-05-10: 10 mL

## 2023-05-10 MED ORDER — HEPARIN SOD (PORK) LOCK FLUSH 100 UNIT/ML IV SOLN
500.0000 [IU] | Freq: Once | INTRAVENOUS | Status: AC | PRN
  Administered 2023-05-10: 500 [IU]

## 2023-05-10 NOTE — Patient Instructions (Signed)
 [facility] [place] [facility]  Discharge Instructions: Thank you for choosing [facility] to provide your oncology and hematology care.  If you have a lab appointment with the Cancer Center - please note that after April 8th, 2024, all labs will be drawn in the cancer center.  You do not have to check in or register with the main entrance as you have in the past but will complete your check-in in the cancer center.  Wear comfortable clothing and clothing appropriate for easy access to any Portacath or PICC line.   We strive to give you quality time with your provider. You may need to reschedule your appointment if you arrive late (15 or more minutes).  Arriving late affects you and other patients whose appointments are after yours.  Also, if you miss three or more appointments without notifying the office, you may be dismissed from the clinic at the provider's discretion.      For prescription refill requests, have your pharmacy contact our office and allow 72 hours for refills to be completed.    Today you received the following chemotherapy and/or immunotherapy agents pemetrexed, magnesium infusion   To help prevent nausea and vomiting after your treatment, we encourage you to take your nausea medication as directed.  BELOW ARE SYMPTOMS THAT SHOULD BE REPORTED IMMEDIATELY: *FEVER GREATER THAN 100.4 F (38 C) OR HIGHER *CHILLS OR SWEATING *NAUSEA AND VOMITING THAT IS NOT CONTROLLED WITH YOUR NAUSEA MEDICATION *UNUSUAL SHORTNESS OF BREATH *UNUSUAL BRUISING OR BLEEDING *URINARY PROBLEMS (pain or burning when urinating, or frequent urination) *BOWEL PROBLEMS (unusual diarrhea, constipation, pain near the anus) TENDERNESS IN MOUTH AND THROAT WITH OR WITHOUT PRESENCE OF ULCERS (sore throat, sores in mouth, or a toothache) UNUSUAL RASH, SWELLING OR PAIN  UNUSUAL VAGINAL DISCHARGE OR ITCHING   Items with * indicate a potential emergency and  should be followed up as soon as possible or go to the Emergency Department if any problems should occur.  Please show the CHEMOTHERAPY ALERT CARD or IMMUNOTHERAPY ALERT CARD at check-in to the Emergency Department and triage nurse.  Should you have questions after your visit or need to cancel or reschedule your appointment, please contact [facility]ER [facility] [place] - A DEPT OF [facility] [phone]  and follow the prompts.  Office hours are 8:00 a.m. to 4:30 p.m. Monday - Friday. Please note that voicemails left after 4:00 p.m. may not be returned until the following business day.  We are closed weekends and major holidays. You have access to a nurse at all times for urgent questions. Please call the main number to the clinic [phone] and follow the prompts.  For any non-urgent questions, you may also contact your provider using MyChart. We now offer e-Visits for anyone 28 and older to request care online for non-urgent symptoms. For details visit [email or web address].   Also download the MyChart app! Go to the app store, search "MyChart", open the app, select [place], and log in with your MyChart username and password.

## 2023-05-10 NOTE — Progress Notes (Signed)
 Labs reviewed today. Spoke with MD regarding issues with diarrhea. Will hold [place] today per MD   Treatment given per orders. Patient tolerated it well without problems. Vitals stable and discharged home from clinic ambulatory. Follow up as scheduled.

## 2023-05-18 ENCOUNTER — Other Ambulatory Visit: Payer: Self-pay

## 2023-05-23 ENCOUNTER — Ambulatory Visit (HOSPITAL_COMMUNITY)
Admission: RE | Admit: 2023-05-23 | Discharge: 2023-05-23 | Disposition: A | Discharge status: Home / Self Care | Source: Ambulatory Visit | Attending: Hematology | Admitting: Hematology

## 2023-05-23 DIAGNOSIS — C3491 Malignant neoplasm of unspecified part of right bronchus or lung: Secondary | ICD-10-CM | POA: Diagnosis present

## 2023-05-23 MED ORDER — HEPARIN SOD (PORK) LOCK FLUSH 100 UNIT/ML IV SOLN
500.0000 [IU] | Freq: Once | INTRAVENOUS | Status: AC
  Administered 2023-05-23: 500 [IU] via INTRAVENOUS

## 2023-05-23 MED ORDER — IOHEXOL 300 MG/ML  SOLN
100.0000 mL | Freq: Once | INTRAMUSCULAR | Status: AC | PRN
  Administered 2023-05-23: 100 mL via INTRAVENOUS

## 2023-05-23 MED ORDER — HEPARIN SOD (PORK) LOCK FLUSH 100 UNIT/ML IV SOLN
INTRAVENOUS | Status: AC
  Filled 2023-05-23: qty 5

## 2023-05-31 ENCOUNTER — Inpatient Hospital Stay: Attending: Hematology | Admitting: Hematology

## 2023-05-31 ENCOUNTER — Inpatient Hospital Stay

## 2023-05-31 ENCOUNTER — Inpatient Hospital Stay: Admitting: Dietician

## 2023-05-31 VITALS — BP 101/44 | HR 87 | Temp 97.1°F | Resp 19

## 2023-05-31 VITALS — BP 115/57 | HR 88 | Temp 96.7°F | Resp 20 | Wt 144.2 lb

## 2023-05-31 DIAGNOSIS — M546 Pain in thoracic spine: Secondary | ICD-10-CM | POA: Insufficient documentation

## 2023-05-31 DIAGNOSIS — Z95828 Presence of other vascular implants and grafts: Secondary | ICD-10-CM

## 2023-05-31 DIAGNOSIS — J948 Other specified pleural conditions: Secondary | ICD-10-CM | POA: Insufficient documentation

## 2023-05-31 DIAGNOSIS — Z7952 Long term (current) use of systemic steroids: Secondary | ICD-10-CM | POA: Diagnosis not present

## 2023-05-31 DIAGNOSIS — C7951 Secondary malignant neoplasm of bone: Secondary | ICD-10-CM | POA: Diagnosis not present

## 2023-05-31 DIAGNOSIS — Z5111 Encounter for antineoplastic chemotherapy: Secondary | ICD-10-CM | POA: Diagnosis present

## 2023-05-31 DIAGNOSIS — C3491 Malignant neoplasm of unspecified part of right bronchus or lung: Secondary | ICD-10-CM | POA: Diagnosis not present

## 2023-05-31 DIAGNOSIS — C3431 Malignant neoplasm of lower lobe, right bronchus or lung: Secondary | ICD-10-CM | POA: Insufficient documentation

## 2023-05-31 LAB — COMPREHENSIVE METABOLIC PANEL WITH GFR
ALT: 11 U/L (ref 0–44)
AST: 30 U/L (ref 15–41)
Albumin: 2.9 g/dL — ABNORMAL LOW (ref 3.5–5.0)
Alkaline Phosphatase: 58 U/L (ref 38–126)
Anion gap: 12 (ref 5–15)
BUN: 5 mg/dL — ABNORMAL LOW (ref 6–20)
CO2: 28 mmol/L (ref 22–32)
Calcium: 9.1 mg/dL (ref 8.9–10.3)
Chloride: 98 mmol/L (ref 98–111)
Creatinine, Ser: 0.47 mg/dL (ref 0.44–1.00)
GFR, Estimated: >60 mL/min (ref >60)
Glucose, Bld: 127 mg/dL — ABNORMAL HIGH (ref 70–99)
Potassium: 3.9 mmol/L (ref 3.5–5.1)
Sodium: 138 mmol/L (ref 135–145)
Total Bilirubin: 0.5 mg/dL (ref 0.0–1.2)
Total Protein: 6.4 g/dL — ABNORMAL LOW (ref 6.5–8.1)

## 2023-05-31 LAB — CBC WITH DIFFERENTIAL/PLATELET
Abs Immature Granulocytes: 0.03 10*3/uL (ref 0.00–0.07)
Basophils Absolute: 0 10*3/uL (ref 0.0–0.1)
Basophils Relative: 0 %
Eosinophils Absolute: 0.1 10*3/uL (ref 0.0–0.5)
Eosinophils Relative: 1 %
HCT: 37.5 % (ref 36.0–46.0)
Hemoglobin: 11.8 g/dL — ABNORMAL LOW (ref 12.0–15.0)
Immature Granulocytes: 1 %
Lymphocytes Relative: 22 %
Lymphs Abs: 1.2 10*3/uL (ref 0.7–4.0)
MCH: 32.4 pg (ref 26.0–34.0)
MCHC: 31.5 g/dL (ref 30.0–36.0)
MCV: 103 fL — ABNORMAL HIGH (ref 80.0–100.0)
Monocytes Absolute: 0.5 10*3/uL (ref 0.1–1.0)
Monocytes Relative: 10 %
Neutro Abs: 3.5 10*3/uL (ref 1.7–7.7)
Neutrophils Relative %: 66 %
Platelets: 249 10*3/uL (ref 150–400)
RBC: 3.64 MIL/uL — ABNORMAL LOW (ref 3.87–5.11)
RDW: 17.2 % — ABNORMAL HIGH (ref 11.5–15.5)
WBC: 5.3 10*3/uL (ref 4.0–10.5)
nRBC: 0 % (ref 0.0–0.2)

## 2023-05-31 LAB — MAGNESIUM: Magnesium: 1.4 mg/dL — ABNORMAL LOW (ref 1.7–2.4)

## 2023-05-31 MED ORDER — SODIUM CHLORIDE 0.9% FLUSH
10.0000 mL | INTRAVENOUS | Status: DC | PRN
  Administered 2023-05-31: 10 mL via INTRAVENOUS

## 2023-05-31 MED ORDER — SODIUM CHLORIDE 0.9 % IV SOLN
400.0000 mg/m2 | Freq: Once | INTRAVENOUS | Status: AC
  Administered 2023-05-31: 700 mg via INTRAVENOUS
  Filled 2023-05-31: qty 20

## 2023-05-31 MED ORDER — PROCHLORPERAZINE MALEATE 10 MG PO TABS
10.0000 mg | ORAL_TABLET | Freq: Once | ORAL | Status: AC
Start: 2023-05-31 — End: 2023-05-31
  Administered 2023-05-31: 10 mg via ORAL
  Filled 2023-05-31: qty 1

## 2023-05-31 MED ORDER — PREDNISONE 20 MG PO TABS: 20.0000 mg | ORAL_TABLET | Freq: Every day | ORAL | 1 refills | Status: DC

## 2023-05-31 MED ORDER — SODIUM CHLORIDE 0.9 % IV SOLN: Freq: Once | INTRAVENOUS | Status: AC

## 2023-05-31 MED ORDER — HEPARIN SOD (PORK) LOCK FLUSH 100 UNIT/ML IV SOLN
500.0000 [IU] | Freq: Once | INTRAVENOUS | Status: AC | PRN
Start: 2023-05-31 — End: 2023-05-31
  Administered 2023-05-31: 500 [IU]

## 2023-05-31 MED ORDER — MAGNESIUM SULFATE 4 GM/100ML IV SOLN
4.0000 g | Freq: Once | INTRAVENOUS | Status: AC
  Administered 2023-05-31: 4 g via INTRAVENOUS
  Filled 2023-05-31: qty 100

## 2023-05-31 MED ORDER — SODIUM CHLORIDE 0.9% FLUSH
10.0000 mL | INTRAVENOUS | Status: DC | PRN
  Administered 2023-05-31: 10 mL

## 2023-05-31 NOTE — Patient Instructions (Signed)
 [facility] [place] - A DEPT OF [facility]  Discharge Instructions: Thank you for choosing [facility] to provide your oncology and hematology care.  If you have a lab appointment with the Cancer Center - please note that after April 8th, 2024, all labs will be drawn in the cancer center.  You do not have to check in or register with the main entrance as you have in the past but will complete your check-in in the cancer center.  Wear comfortable clothing and clothing appropriate for easy access to any Portacath or PICC line.   We strive to give you quality time with your provider. You may need to reschedule your appointment if you arrive late (15 or more minutes).  Arriving late affects you and other patients whose appointments are after yours.  Also, if you miss three or more appointments without notifying the office, you may be dismissed from the clinic at the provider's discretion.      For prescription refill requests, have your pharmacy contact our office and allow 72 hours for refills to be completed.    Today you received the following chemotherapy and/or immunotherapy agents Alimta , return as scheduled.   To help prevent nausea and vomiting after your treatment, we encourage you to take your nausea medication as directed.  BELOW ARE SYMPTOMS THAT SHOULD BE REPORTED IMMEDIATELY: *FEVER GREATER THAN 100.4 F (38 C) OR HIGHER *CHILLS OR SWEATING *NAUSEA AND VOMITING THAT IS NOT CONTROLLED WITH YOUR NAUSEA MEDICATION *UNUSUAL SHORTNESS OF BREATH *UNUSUAL BRUISING OR BLEEDING *URINARY PROBLEMS (pain or burning when urinating, or frequent urination) *BOWEL PROBLEMS (unusual diarrhea, constipation, pain near the anus) TENDERNESS IN MOUTH AND THROAT WITH OR WITHOUT PRESENCE OF ULCERS (sore throat, sores in mouth, or a toothache) UNUSUAL RASH, SWELLING OR PAIN  UNUSUAL VAGINAL DISCHARGE OR ITCHING   Items with * indicate a potential emergency and  should be followed up as soon as possible or go to the Emergency Department if any problems should occur.  Please show the CHEMOTHERAPY ALERT CARD or IMMUNOTHERAPY ALERT CARD at check-in to the Emergency Department and triage nurse.  Should you have questions after your visit or need to cancel or reschedule your appointment, please contact [facility]ER [facility] [place] - A DEPT OF [facility] [phone]  and follow the prompts.  Office hours are 8:00 a.m. to 4:30 p.m. Monday - Friday. Please note that voicemails left after 4:00 p.m. may not be returned until the following business day.  We are closed weekends and major holidays. You have access to a nurse at all times for urgent questions. Please call the main number to the clinic [phone] and follow the prompts.  For any non-urgent questions, you may also contact your provider using MyChart. We now offer e-Visits for anyone 73 and older to request care online for non-urgent symptoms. For details visit [email or web address].   Also download the MyChart app! Go to the app store, search "MyChart", open the app, select [place], and log in with your MyChart username and password.

## 2023-05-31 NOTE — Progress Notes (Signed)
 Patient okay for treatment (Alimta  only) today per Dr. Cheree Cords. Additional orders received for 4g of magnesium . Patient tolerated therapy with no complaints voiced. Side effects with management reviewed understanding verbalized. Port site clean and dry with no bruising or swelling noted at site. Good blood return noted before and after administration of chemotherapy. Band aid applied. Patient left in satisfactory condition with VSS and no s/s of distress noted.

## 2023-05-31 NOTE — Progress Notes (Signed)
 Nutrition Follow-up:  Pt with non small cell lung cancer. She is receiving maintenance alimta  q21d. Patient under the care of Dr. Cheree Cords.  Met with patient in infusion. She reports poor appetite and dysphagia. Patient eats once a day at dinner. Says she gets hungry and wants to eat, however foods ball up in her mouth and unable to swallow. Patient drinks Ensure when able to afford them. She loves milk and endorses drinking a lot of this. Patient denies nausea, vomiting, diarrhea, constipation.    Medications: reviewed   Labs: glucose 127, BUN 35, albumin 2.9, Mg 1.4  Anthropometrics: Wt 144 lb 2.9 oz today   4/10 - 144 lb 13.5 oz  3/20 - 142 lb 11.2 oz    NUTRITION DIAGNOSIS: Unintended wt loss - stable   INTERVENTION:  Educated on strategies for increasing oral intake with small frequent meals/snacks q2-3h vs one meal late in the day - snack ideas provided Suggested soft moist textures (adding gravy/sauces) for ease of intake - handout with ideas provided Continue drinking milk, suggested trying mixing with CIB powder for alternate supplement idea that is more affordable Samples of Ensure Complete + coupons provided Chicken salad snack kit provided to pt during infusion     MONITORING, EVALUATION, GOAL: wt trends, intake   NEXT VISIT: To be scheduled as needed

## 2023-05-31 NOTE — Progress Notes (Signed)
 Patient has been examined by Dr. Cheree Cords. Vital signs and labs have been reviewed by MD - ANC, Creatinine, LFTs, hemoglobin, and platelets are within treatment parameters per M.D. - pt may proceed with treatment. Hold Keytruda  today per MD. Primary RN and pharmacy notified.

## 2023-05-31 NOTE — Progress Notes (Signed)
 Patients port flushed without difficulty.  Good blood return noted with no bruising or swelling noted at site.  Patient remains accessed for treatment.

## 2023-05-31 NOTE — Progress Notes (Signed)
 [facility] [address] [place] [address]    Clinic Day:  05/31/2023  Referring physician: Omie Bickers, MD  Patient Care Team: Omie Bickers, MD as PCP - General (Internal Medicine) Riley Cheadle Windsor Hatcher, MD as Consulting Physician (Gastroenterology) Paulett Boros, MD as Consulting Physician (Medical Oncology)   ASSESSMENT & PLAN:   Assessment: 1.  Metastatic lung adenocarcinoma: -PET scan on 05/13/2019 showed right lower lobe nodule concerning for bronchogenic carcinoma.  Cirrhotic liver. -Right lower lobectomy and lymph node excision on 06/19/2019. -Pathology showed 1.7 cm right lower lobe adenocarcinoma, unifocal, lymphovascular invasion present.  Margins negative.  2/14 lymph nodes positive (level 7 and 10R).  PT1CPN2. -PD-L1 30%, K-ras G12A, MSI stable.  EGFR mutation not identified. -4 cycles of adjuvant carboplatin  and pemetrexed  from 09/09/2019 through 11/11/2019. - CT chest on 12/29/2021: Lytic metastatic lesion in the posterior left fifth rib.  Interval enlargement of cavitary nodule of the dependent right upper lobe.  Numerous new clustered nodules of varying sizes. - PET scan (01/19/2022): Cavitary nodule of concern in the right middle lobe measures 1.3 x 0.7 cm, SUV max of 1.8.  No other hypermetabolic or suspicious lung nodules.  Expansile lytic metastasis involving left fifth rib hypermetabolic measuring 2.6 x 1.9 cm with SUV 4.2. - Left rib biopsy (02/06/2022): Metastatic moderately to poorly differentiated adenocarcinoma - NGS by [facility]: PD-L1 (22 C3)-TPS 5%.  TMB-high.  K-ras G12 A pathogenic variant.  T p53 pathogenic variant.  Other targetable mutations negative.  MSI-stable. - XRT to the left rib lesion completed - SBRT to the T5 lesion from 06/12/2022 through 06/16/2022 - Cycle 1 of carboplatin , pemetrexed  and pembrolizumab  on 06/29/2022    Plan: 1.  Metastatic adenocarcinoma of the lung to the left rib: - PET scan (01/04/2023): Resolved  nodularity in the right middle lobe and prior bone metastatic disease has improved. - CT CAP (05/23/2023): Nodularity in the right lung appears similar to decreased from previous exams.  Some residual pleural thickening and trace pleural fluid.  New parenchymal opacity in the right upper lobe as well as scattered in the left lung?  Inflammatory/infectious. - Her last treatment on 05/10/2023, Keytruda  was held due to diarrhea.  She is taking Lomotil  and her diarrhea is better.  She has up to 2-3 watery bowel movements per day. - Based on the CT scan findings which showed questionable pneumonitis, I will hold Keytruda  at this time.  Proceed with pemetrexed  single agent today. - Will start her on prednisone  20 mg daily until she sees me back in 3 weeks.    2.  Left mid back pain: - Continue tramadol  as needed.   3.  Hypomagnesemia: - Continue magnesium  2 tablets daily.  Magnesium  is low today.  She will receive IV magnesium .    Orders Placed This Encounter  Procedures   Magnesium     Standing Status:   Future    Expected Date:   08/02/2023    Expiration Date:   08/01/2024   CBC with Differential    Standing Status:   Future    Expected Date:   08/02/2023    Expiration Date:   08/01/2024   Comprehensive metabolic panel    Standing Status:   Future    Expected Date:   08/02/2023    Expiration Date:   08/01/2024   Magnesium     Standing Status:   Future    Expected Date:   08/23/2023    Expiration Date:   08/22/2024  CBC with Differential    Standing Status:   Future    Expected Date:   08/23/2023    Expiration Date:   08/22/2024   Comprehensive metabolic panel    Standing Status:   Future    Expected Date:   08/23/2023    Expiration Date:   08/22/2024   T4    Standing Status:   Future    Expected Date:   08/23/2023    Expiration Date:   08/22/2024   TSH    Standing Status:   Future    Expected Date:   08/23/2023    Expiration Date:   08/22/2024   Magnesium     Standing Status:   Future    Expected  Date:   09/13/2023    Expiration Date:   09/12/2024   CBC with Differential    Standing Status:   Future    Expected Date:   09/13/2023    Expiration Date:   09/12/2024   Comprehensive metabolic panel    Standing Status:   Future    Expected Date:   09/13/2023    Expiration Date:   09/12/2024   Magnesium     Standing Status:   Future    Expected Date:   10/04/2023    Expiration Date:   10/03/2024   CBC with Differential    Standing Status:   Future    Expected Date:   10/04/2023    Expiration Date:   10/03/2024   Comprehensive metabolic panel    Standing Status:   Future    Expected Date:   10/04/2023    Expiration Date:   10/03/2024      Rachel Gross,acting as a scribe for Paulett Boros, MD.,have documented all relevant documentation on the behalf of Paulett Boros, MD,as directed by  Paulett Boros, MD while in the presence of Paulett Boros, MD.  I, Paulett Boros MD, have reviewed the above documentation for accuracy and completeness, and I agree with the above.      Paulett Boros, MD   5/1/202511:30 AM  CHIEF COMPLAINT:   Diagnosis: metastatic right lung cancer    Cancer Staging  Non-small cell cancer of right lung [facility]) Staging form: Lung, AJCC 8th Edition - Clinical stage from 02/13/2022: Stage IV (cT1c, cN2, pM1) - Signed by Paulett Boros, MD on 02/13/2022    Prior Therapy: 1. Right lower lobectomy on 06/19/2019. 2. Carboplatin  and pemetrexed  x 4 cycles from 09/09/2019 to 11/11/2019 3. SBRT to left rib/chest wall lesion, completed 03/22/22 4. SBRT to T5 lesion, 06/12/22 - 06/16/22 5. carboplatin , pemetrexed  and pembrolizumab , 06/29/22 through 08/30/22  Current Therapy:  pemetrexed  and pembrolizumab     HISTORY OF PRESENT ILLNESS:   Oncology History  Adenocarcinoma of lung, stage 3, right (HCC)  07/02/2019 Initial Diagnosis   Adenocarcinoma of lung, stage 3, right (HCC)   07/18/2019 Genetic Testing   Foundation One     07/23/2019  Genetic Testing   PD-L1     09/09/2019 - 11/11/2019 Chemotherapy   Patient is on Treatment Plan : LUNG NSCLC Pemetrexed  (Alimta ) / Carboplatin  q21d x 4 cycles     Non-small cell cancer of right lung (HCC)  02/13/2022 Initial Diagnosis   Non-small cell cancer of right lung (HCC)   02/13/2022 Cancer Staging   Staging form: Lung, AJCC 8th Edition - Clinical stage from 02/13/2022: Stage IV (cT1c, cN2, pM1) - Signed by Paulett Boros, MD on 02/13/2022 Histopathologic type: Adenocarcinoma, NOS Stage prefix: Initial diagnosis Histologic grade (G): G3 Histologic  grading system: 4 grade system   06/29/2022 -  Chemotherapy   Patient is on Treatment Plan : LUNG Carboplatin  (5) + Pemetrexed  (500) + Pembrolizumab  (200) D1 q21d Induction x 4 cycles / Maintenance Pemetrexed  (500) + Pembrolizumab  (200) D1 q21d        INTERVAL HISTORY:   Rachel Gross is a 58 y.o. female presenting to clinic today for follow up of metastatic right lung cancer. She was last seen by me on 04/19/23.  Since her last visit, she underwent CT CAP on 05/23/23 that found: Surgical changes of right lower lobectomy. The areas of nodularity in the right lung appears similar to decreased from previous examinations. Some residual pleural thickening and trace pleural fluid. However there are developing parenchymal areas of opacity which are bandlike at the medial right upper lobe as well as scattered in the left lung with increasing small left pleural effusion and pleural thickening. 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 There is some subtle irregularity of the fourth rib as well and some subtle sclerosis along the S1 vertebral level as well as elsewhere.  Today, she states that she is doing well overall. Her appetite level is at 50%. Her energy level is at 0%. Braleigh notes diarrhea 2-3 times a day with watery stools after her last treatment. She has been taking lomotil  for diarrhea, which has  improved symptoms.  She notes breathing is stable, though she still has SOB on exertion. She reports a occasionally coughing. She is taking magnesium  and folic acid  as prescribed.   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    Bipolar disorder (HCC)    Cancer (HCC)    right lung   COPD (chronic obstructive pulmonary disease) (HCC)    Depression    Dyspnea    Emphysema lung (HCC)    GERD (gastroesophageal reflux disease)    Hepatitis C    s/p treatment with Epclusa   History of HPV infection    History of pneumonia    Pneumonia    Smoker     Surgical History: Past Surgical History:  Procedure Laterality Date   BIOPSY  05/02/2018   Procedure: BIOPSY;  Surgeon: Suzette Espy, MD;  Location: [facility];  Service: Endoscopy;;  gastric   CESAREAN SECTION     CHEST TUBE INSERTION Right 06/26/2019   Procedure: Right CHEST TUBE REPLACEMENT;  Surgeon: Zelphia Higashi, MD;  Location[facility] OR;  Service: Thoracic;  Laterality: Right;   COLONOSCOPY WITH PROPOFOL  N/A 03/03/2019   Procedure: COLONOSCOPY WITH PROPOFOL ;  Surgeon: Suzette Espy, MD;  Location: [facility];  Service: Endoscopy;  Laterality: N/A;  9:15am   ESOPHAGOGASTRODUODENOSCOPY (EGD) WITH PROPOFOL  N/A 05/02/2018   Procedure: ESOPHAGOGASTRODUODENOSCOPY (EGD) WITH PROPOFOL ;  Surgeon: Suzette Espy, MD;  Location: [facility];  Service: Endoscopy;  Laterality: N/A;  8:30am   EYE SURGERY Bilateral    cataract   HEMOSTASIS CLIP PLACEMENT  03/03/2019   Procedure: HEMOSTASIS CLIP PLACEMENT;  Surgeon: Suzette Espy, MD;  Location: [facility];  Service: Endoscopy;;   INTERCOSTAL NERVE BLOCK Right 06/19/2019   Procedure: Intercostal Nerve Block;  Surgeon: Zelphia Higashi, MD;  Location: [facility] OR;  Service: Thoracic;  Laterality:  Right;  IR US  GUIDE VASC ACCESS RIGHT  03/13/2018   LOBECTOMY     LYMPH NODE DISSECTION Right 06/19/2019   Procedure: Lymph Node Dissection;  Surgeon: Zelphia Higashi, MD;  Location: [facility] OR;  Service: Thoracic;  Laterality: Right;   POLYPECTOMY  03/03/2019   Procedure: POLYPECTOMY;  Surgeon: Suzette Espy, MD;  [facility]: [facility];  Service: Endoscopy;;   PORTACATH PLACEMENT Left 09/08/2019   Procedure: INSERTION PORT-A-CATH LEFT CHEST (attached catheter in left subclavian);  Surgeon: Alanda Allegra, MD;  Location: [facility];  Service: General;  Laterality: Left;   TONSILLECTOMY     TOOTH EXTRACTION  01/31/2018   x 7   VIDEO BRONCHOSCOPY WITH INSERTION OF INTERBRONCHIAL VALVE (IBV) N/A 06/26/2019   Procedure: VIDEO BRONCHOSCOPY WITH INSERTION OF TWO INTERBRONCHIAL VALVE (IBV);  Surgeon: Zelphia Higashi, MD;  Location: [facility] OR;  Service: Thoracic;  Laterality: N/A;   VIDEO BRONCHOSCOPY WITH INSERTION OF INTERBRONCHIAL VALVE (IBV) N/A 08/08/2019   Procedure: VIDEO BRONCHOSCOPY WITH REMOVAL OF INTERBRONCHIAL VALVE (IBV);  Surgeon: Zelphia Higashi, MD;  Location: [facility] OR;  Service: Thoracic;  Laterality: N/A;    Social History: Social History   Socioeconomic History   Marital status: Single    Spouse name: Not on file   Number of children: Not on file   Years of education: Not on file   Highest education level: Not on file  Occupational History   Not on file  Tobacco Use   Smoking status: Former    Current packs/day: 0.00    Average packs/day: 0.5 packs/day for 38.0 years (19.0 ttl pk-yrs)    Types: Cigarettes    Start date: 06/18/1981    Quit date: 06/19/2019    Years since quitting: 3.9   Smokeless tobacco: Never  Vaping Use   Vaping status: Never Used  Substance and Sexual Activity   Alcohol use: Not Currently    Comment: Last use of alcohol 03/2016   Drug use: Not Currently    Types: 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   Sexual activity: Not Currently  Other Topics Concern    Not on file  Social History Narrative   Not on file   Social Drivers of Health   Financial Resource Strain: Medium Risk (12/10/2019)   Overall Financial Resource Strain (CARDIA)    Difficulty of Paying Living Expenses: Somewhat hard  Food Insecurity: Food Insecurity Present (12/10/2019)   Hunger Vital Sign    Worried About Running Out of Food in the Last Year: Often true    Ran Out of Food in the Last Year: Often true  Transportation Needs: No Transportation Needs (12/10/2019)   PRAPARE - Administrator, Civil Service (Medical): No    Lack of Transportation (Non-Medical): No  Physical Activity: Sufficiently Active (12/10/2019)   Exercise Vital Sign    Days of Exercise per Week: 3 days    Minutes of Exercise per Session: 150+ min  Stress: Stress Concern Present (12/10/2019)   [employer] of Occupational Health - Occupational Stress Questionnaire    Feeling of Stress : Very much  Social Connections: Socially Isolated (12/10/2019)   Social Connection and Isolation Panel [NHANES]    Frequency of Communication with Friends and Family: More than three times a week    Frequency of Social Gatherings with Friends and Family: Once a week    Attends Religious Services: Never    Database administrator or Organizations: No    Attends Banker Meetings:  Never    Marital Status: Divorced  Catering manager Violence: Not At Risk (12/10/2019)   Humiliation, Afraid, Rape, and Kick questionnaire    Fear of Current or Ex-Partner: No    Emotionally Abused: No    Physically Abused: No    Sexually Abused: No    Family History: Family History  Problem Relation Age of Onset   Congenital heart disease Mother    Bipolar disorder Mother    Prostate cancer Brother    Alzheimer's disease Paternal Grandmother    Colon cancer Neg Hx     Current Medications:  Current Outpatient Medications:    albuterol  (VENTOLIN  HFA) 108 (90 Base) MCG/ACT inhaler, Inhale 2 puffs into  the lungs every 4 (four) hours as needed for wheezing or shortness of breath., Disp: 8 g, Rfl: 3   dexamethasone  (DECADRON ) 2 MG tablet, Take 1 tablet (2 mg total) by mouth 2 (two) times daily., Disp: 60 tablet, Rfl: 0   diphenoxylate -atropine  (LOMOTIL ) 2.5-0.025 MG tablet, Take 2 tablets by mouth 4 (four) times daily as needed for diarrhea or loose stools., Disp: 240 tablet, Rfl: 0   divalproex  (DEPAKOTE ) 250 MG DR tablet, Take 250 mg by mouth at bedtime. , Disp: , Rfl:    folic acid  (FOLVITE ) 1 MG tablet, Take 1 tablet (1 mg total) by mouth daily., Disp: 90 tablet, Rfl: 2   ipratropium-albuterol  (DUONEB) 0.5-2.5 (3) MG/3ML SOLN, USE 1 VIAL VIA NEBULIZER EVERY 6 HOURS AS NEEDED, Disp: 360 mL, Rfl: 3   lidocaine -prilocaine  (EMLA ) cream, Apply a small amount to port a cath site and cover with plastic wrap 1 hour prior to chemotherapy appointments, Disp: 30 g, Rfl: 3   loperamide  (IMODIUM ) 2 MG capsule, Take 1 capsule (2 mg total) by mouth as needed for diarrhea or loose stools (Take 2 capsiles after the first loose stool and then 1 capsule after each loos stool. Do nt exceed 8 capsules in a 24hour period. If it is bedtime and you are having loose stools, take 2 capsules at bedtime and then take 2 capsules every 4 hours until morning.)., Disp: 60 capsule, Rfl: 1   magnesium  oxide (MAG-OX) 400 (240 Mg) MG tablet, Take 1 tablet (400 mg total) by mouth 2 (two) times daily., Disp: 120 tablet, Rfl: 4   ondansetron  (ZOFRAN ) 4 MG tablet, Take 1 tablet (4 mg total) by mouth every 8 (eight) hours as needed for nausea or vomiting., Disp: 20 tablet, Rfl: 0   pantoprazole  (PROTONIX ) 40 MG tablet, TAKE 1 TABLET BY MOUTH DAILY BEFORE BREAKFAST, Disp: 90 tablet, Rfl: 1   predniSONE  (DELTASONE ) 20 MG tablet, Take 1 tablet (20 mg total) by mouth daily with breakfast., Disp: 30 tablet, Rfl: 1   prochlorperazine  (COMPAZINE ) 10 MG tablet, Take 1 tablet (10 mg total) by mouth every 6 (six) hours as needed for nausea or  vomiting., Disp: 60 tablet, Rfl: 5   risperiDONE  (RISPERDAL ) 2 MG tablet, Take 2 mg by mouth at bedtime. , Disp: , Rfl:    SYMBICORT  160-4.5 MCG/ACT inhaler, Inhale 2 puffs into the lungs 2 (two) times daily., Disp: 1 each, Rfl: 6   traMADol  (ULTRAM ) 50 MG tablet, Take 1 tablet (50 mg total) by mouth 2 (two) times daily as needed., Disp: 60 tablet, Rfl: 0   traZODone (DESYREL) 100 MG tablet, Take by mouth., Disp: , Rfl:  No current facility-administered medications for this visit.  Facility-Administered Medications Ordered in Other Visits:    heparin  lock flush 100 unit/mL, 500 Units, Intracatheter,  Once PRN, Hyla Coard, MD   magnesium  sulfate IVPB 4 g 100 mL, 4 g, Intravenous, Once, Paulett Boros, MD, Last Rate: 100 mL/hr at 05/31/23 1123, 4 g at 05/31/23 1123   PEMEtrexed  Disodium (ALIMTA ) 700 mg in sodium chloride  0.9 % 100 mL chemo infusion, 400 mg/m2 (Treatment Plan Recorded), Intravenous, Once, Paulett Boros, MD   sodium chloride  flush (NS) 0.9 % injection 10 mL, 10 mL, Intracatheter, PRN, Caelan Atchley, MD   Allergies: Allergies  Allergen Reactions   Folic Acid  Anaphylaxis    Not entirely clear if it is from folic acid . She is taking folic acid  now and doesn't have the throat swelling anymore   Penicillins Anaphylaxis    Throat swelled Did it involve swelling of the face/tongue/throat, SOB, or low BP? Yes Did it involve sudden or severe rash/hives, skin peeling, or any reaction on the inside of your mouth or nose? No Did you need to seek medical attention at a hospital or doctor's office? No When did it last happen?      childhood allergy If all above answers are "NO", may proceed with cephalosporin use.    Amoxicillin     hallucinations Did it involve swelling of the face/tongue/throat, SOB, or low BP? No Did it involve sudden or severe rash/hives, skin peeling, or any reaction on the inside of your mouth or nose? No Did you need to seek medical  attention at a hospital or doctor's office? Yes When did it last happen?      July 2019 If all above answers are "NO", may proceed with cephalosporin use.    Carboplatin  Nausea Only, Other (See Comments) and Cough    Patient complaints of feeling hot and flushed. See progress note from 7/11. Patient able to complete infusion after additional medications given.  Fingers itch, nausea Second reaction on 08/30/2022; included itching of hands and nausea; see progress note from 08/30/2022   Fish Allergy Nausea Only   Other Swelling    Patient states that the sunrise brand folic acid  made her feel like her throat was swelling.     REVIEW OF SYSTEMS:   Review of Systems  Constitutional:  Negative for chills, fatigue and fever.  HENT:   Negative for lump/mass, mouth sores, nosebleeds, sore throat and trouble swallowing.   Eyes:  Negative for eye problems.  Respiratory:  Positive for cough and shortness of breath.   Cardiovascular:  Negative for chest pain, leg swelling and palpitations.  Gastrointestinal:  Positive for constipation, diarrhea, nausea and vomiting. Negative for abdominal pain.  Genitourinary:  Positive for frequency. Negative for bladder incontinence, difficulty urinating, dysuria, hematuria and nocturia.   Musculoskeletal:  Negative for arthralgias, back pain, flank pain, myalgias and neck pain.  Skin:  Negative for itching and rash.  Neurological:  Positive for dizziness and headaches. Negative for numbness.  Hematological:  Does not bruise/bleed easily.  Psychiatric/Behavioral:  Negative for depression, sleep disturbance and suicidal ideas. The patient is not nervous/anxious.   All other systems reviewed and are negative.    VITALS:   There were no vitals taken for this visit.  Wt Readings from Last 3 Encounters:  05/31/23 144 lb 2.9 oz (65.4 kg)  05/10/23 144 lb 13.5 oz (65.7 kg)  04/19/23 142 lb 11.2 oz (64.7 kg)    There is no height or weight on file to calculate  BMI.  Performance status (ECOG): 1 - Symptomatic but completely ambulatory  PHYSICAL EXAM:   Physical Exam Vitals and nursing note reviewed.  Exam conducted with a chaperone present.  Constitutional:      Appearance: Normal appearance.  Cardiovascular:     Rate and Rhythm: Normal rate and regular rhythm.     Pulses: Normal pulses.     Heart sounds: Normal heart sounds.  Pulmonary:     Effort: Pulmonary effort is normal.     Breath sounds: Normal breath sounds.  Abdominal:     Palpations: Abdomen is soft. There is no hepatomegaly, splenomegaly or mass.     Tenderness: There is no abdominal tenderness.  Musculoskeletal:     Right lower leg: No edema.     Left lower leg: No edema.  Lymphadenopathy:     Cervical: No cervical adenopathy.     Right cervical: No superficial, deep or posterior cervical adenopathy.    Left cervical: No superficial, deep or posterior cervical adenopathy.     Upper Body:     Right upper body: No supraclavicular or axillary adenopathy.     Left upper body: No supraclavicular or axillary adenopathy.  Neurological:     General: No focal deficit present.     Mental Status: She is alert and oriented to person, place, and time.  Psychiatric:        Mood and Affect: Mood normal.        Behavior: Behavior normal.     LABS:   CBC     Component Value Date/Time   WBC 5.3 05/31/2023 1021   RBC 3.64 (L) 05/31/2023 1021   HGB 11.8 (L) 05/31/2023 1021   HCT 37.5 05/31/2023 1021   PLT 249 05/31/2023 1021   MCV 103.0 (H) 05/31/2023 1021   MCH 32.4 05/31/2023 1021   MCHC 31.5 05/31/2023 1021   RDW 17.2 (H) 05/31/2023 1021   LYMPHSABS 1.2 05/31/2023 1021   MONOABS 0.5 05/31/2023 1021   EOSABS 0.1 05/31/2023 1021   BASOSABS 0.0 05/31/2023 1021    CMP      Component Value Date/Time   NA 138 05/31/2023 1021   K 3.9 05/31/2023 1021   CL 98 05/31/2023 1021   CO2 28 05/31/2023 1021   GLUCOSE 127 (H) 05/31/2023 1021   BUN 5 (L) 05/31/2023 1021    CREATININE 0.47 05/31/2023 1021   CREATININE 0.45 (L) 09/10/2017 1422   CALCIUM 9.1 05/31/2023 1021   PROT 6.4 (L) 05/31/2023 1021   ALBUMIN 2.9 (L) 05/31/2023 1021   AST 30 05/31/2023 1021   ALT 11 05/31/2023 1021   ALKPHOS 58 05/31/2023 1021   BILITOT 0.5 05/31/2023 1021   GFRNONAA >60 05/31/2023 1021   GFRAA >60 10/21/2019 0942     No results found for: "CEA1", "CEA" / No results found for: "CEA1", "CEA" No results found for: "PSA1" No results found for: "ZOX096" No results found for: "CAN125"  No results found for: "TOTALPROTELP", "ALBUMINELP", "A1GS", "A2GS", "BETS", "BETA2SER", "GAMS", "MSPIKE", "SPEI" Lab Results  Component Value Date   TIBC 362 01/17/2018   FERRITIN 136 01/17/2018   IRONPCTSAT 40 (H) 01/17/2018   Lab Results  Component Value Date   LDH 146 05/01/2019   LDH 168 11/28/2017   LDH 185 08/24/2017     STUDIES:   CT CHEST ABDOMEN PELVIS W CONTRAST Result Date: 05/29/2023 CLINICAL DATA:  Lung adenocarcinoma. Staging. * Tracking Code: BO * EXAM: CT CHEST, ABDOMEN, AND PELVIS WITH CONTRAST TECHNIQUE: Multidetector CT imaging of the chest, abdomen and pelvis was performed following the standard protocol during bolus administration of intravenous contrast. RADIATION DOSE REDUCTION: This exam was  performed according to the departmental dose-optimization program which includes automated exposure control, adjustment of the mA and/or kV according to patient size and/or use of iterative reconstruction technique. CONTRAST:  OMNIPAQUE  IOHEXOL  300 MG/ML  SOLN COMPARISON:  Chest CT 05/29/2022 and older.  PET-CT 01/04/2023. FINDINGS: CT CHEST FINDINGS Cardiovascular: Left IJ chest port. Port is accessed the tip of the catheter extends to the SVC right atrial junction. Heart is nonenlarged. No pericardial effusion. Coronary artery calcifications are seen. Please correlate for other coronary risk factors. The thoracic aorta is normal course and caliber with scattered  calcified plaque. Plaque also seen along the great vessels. Mediastinum/Nodes: Small thyroid  gland. Normal caliber thoracic esophagus. No specific abnormal lymph node enlargement identified in the axillary regions, hilum. Small lymph node anterior to the right main bronchus which on the prior CT measured 15 x 10 mm, today on series 2, image 23 measures 14 x 9 mm. Lesion was not hypermetabolic on prior PET-CT. No new nodal enlargement in the mediastinum. There is some mediastinal mild soft tissue edema. Lungs/Pleura: Right lung has some apical areas of opacity and pleural thickening. Paraseptal changes at the right lung apex. Tiny pleural effusion at the medial right lung base is again noted and unchanged. Surgical changes of right lower lobectomy. The nodular areas in the right lower lobe seen on the remote CT scan of April 2024 at 16 x 8 mm is smaller today measuring 14 by 6 mm on image 86 of series 4. Is also less confluent. Nodule right lung previously measuring 7 mm, today measures 8 mm on image 80. Nodule right lower lung peripherally measuring 4 mm on image 87 is stable. No new right-sided lung nodules. Left lung has increasing lateral areas of pleural thickening with a very small pleural effusion. There are bandlike areas of opacity identified developing in both the left lower lung and upper lobes. Areas more towards the lingula and medial left upper lobe as well as along the lateral left lower lobe. There is some fluid tracking along the interlobar fissure as well. Changes are indeterminate. These could be infectious or inflammatory. Please correlate clinical presentation and recommend short follow-up. Musculoskeletal: Once again there is a destructive lesion along the posterolateral aspect of the left fifth rib. Also some lucencies along the fourth rib. These may be progressive from previous. Patchy areas of disease also seen along the midthoracic spine. Mild sclerosis along the sternum. Old right-sided rib  fracture. CT ABDOMEN PELVIS FINDINGS Hepatobiliary: Slight nodular contour to the liver. Please correlate for any history of chronic liver disease. Gallbladder is nondilated. Patent portal vein. No space-occupying liver lesion. Pancreas: Unremarkable. No pancreatic ductal dilatation or surrounding inflammatory changes. Spleen: Normal in size without focal abnormality. Adrenals/Urinary Tract: Stable slight nodularity of the adrenal glands, left greater than right. Mild bilateral renal atrophy. Punctate nonobstructing lower pole left-sided renal stone. No enhancing renal mass or collecting system dilatation. Ureters have normal course and caliber extending down to the urinary bladder. Bladder is underdistended but has slight wall thickening, nonspecific. Stomach/Bowel: Stomach and small bowel are nondilated. Large bowel is normal course and caliber. Diffuse colonic stool. Scattered colonic diverticula. Normal appendix. Vascular/Lymphatic: Aortic atherosclerosis. No enlarged abdominal or pelvic lymph nodes. Reproductive: Uterus is present with a small dystrophic calcification, possible calcified fibroid. No adnexal mass Other: Anasarca. Trace mesenteric haziness. Small calcification seen along the central pelvic mesentery. Possibly a calcified lymph node. Unchanged from previous. Musculoskeletal: Slight curvature of the spine. Degenerative changes of the spine  and pelvis. Injection granulomas. Multilevel mild disc bulge. There is some sclerosis involving the S1 vertebral level. IMPRESSION: Surgical changes of right lower lobectomy. The areas of nodularity in the right lung appears similar to decreased from previous examinations. Some residual pleural thickening and trace pleural fluid. However there are developing parenchymal areas of opacity which are bandlike at the medial right upper lobe as well as scattered in the left lung with increasing small left pleural effusion and pleural thickening. These areas would have  a differential including infectious or inflammatory process but a more aggressive etiologies in the differential. 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 There is some subtle irregularity of the fourth rib as well and some subtle sclerosis along the S1 vertebral level as well as elsewhere. Overall with the changes short follow up CT scan in 3 months could be performed versus a PET-CT. Coronary calcifications.  Nodular liver.  Colonic diverticula. Electronically Signed   By: Adrianna Horde M.D.   On: 05/29/2023 14:23

## 2023-05-31 NOTE — Patient Instructions (Addendum)
[facility] at [facility] Discharge Instructions   You were seen and examined today by Dr. Ellin Saba.  He reviewed the results of your lab work which are normal/stable.   He reviewed the results of your CT scan which is stable. 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   We will proceed with your treatment today.   Return as scheduled.    Thank you for choosing [facility] at [facility] to provide your oncology and hematology care.  To afford each patient quality time with our provider, please arrive at least 15 minutes before your scheduled appointment time.   If you have a lab appointment with the Cancer Center please come in thru the Main Entrance and check in at the main information desk.  You need to re-schedule your appointment should you arrive 10 or more minutes late.  We strive to give you quality time with our providers, and arriving late affects you and other patients whose appointments are after yours.  Also, if you no show three or more times for appointments you may be dismissed from the clinic at the providers discretion.     Again, thank you for choosing [facility].  Our hope is that these requests will decrease the amount of time that you wait before being seen by our physicians.       _____________________________________________________________  Should you have questions after your visit to [facility], please contact our office at [phone] and follow the prompts.  Our office hours are 8:00 a.m. and 4:30 p.m. Monday - Friday.  Please note that voicemails left after 4:00 p.m. may not be returned until the following business day.  We are closed weekends and major holidays.  You do have access to a nurse 24-7, just call the main number to the clinic [phone] and do not press any options, hold on the line and a nurse will answer the phone.    For prescription refill requests, have your pharmacy  contact our office and allow 72 hours.    Due to Covid, you will need to wear a mask upon entering the hospital. If you do not have a mask, a mask will be given to you at the Main Entrance upon arrival. For doctor visits, patients may have 1 support person age 45 or older with them. For treatment visits, patients can not have anyone with them due to social distancing guidelines and our immunocompromised population.

## 2023-06-01 ENCOUNTER — Other Ambulatory Visit: Payer: Self-pay

## 2023-06-04 ENCOUNTER — Encounter: Payer: Self-pay | Admitting: Internal Medicine

## 2023-06-04 ENCOUNTER — Encounter: Payer: Self-pay | Admitting: Hematology

## 2023-06-09 ENCOUNTER — Other Ambulatory Visit: Payer: Self-pay

## 2023-06-21 ENCOUNTER — Inpatient Hospital Stay

## 2023-06-21 ENCOUNTER — Inpatient Hospital Stay (HOSPITAL_BASED_OUTPATIENT_CLINIC_OR_DEPARTMENT_OTHER): Admitting: Hematology

## 2023-06-21 VITALS — Wt 148.8 lb

## 2023-06-21 VITALS — BP 104/57 | HR 84 | Temp 97.8°F | Resp 18

## 2023-06-21 DIAGNOSIS — C3491 Malignant neoplasm of unspecified part of right bronchus or lung: Secondary | ICD-10-CM

## 2023-06-21 DIAGNOSIS — Z5111 Encounter for antineoplastic chemotherapy: Secondary | ICD-10-CM | POA: Diagnosis not present

## 2023-06-21 LAB — CBC WITH DIFFERENTIAL/PLATELET
Abs Immature Granulocytes: 0.04 10*3/uL (ref 0.00–0.07)
Basophils Absolute: 0 10*3/uL (ref 0.0–0.1)
Basophils Relative: 0 %
Eosinophils Absolute: 0 10*3/uL (ref 0.0–0.5)
Eosinophils Relative: 0 %
HCT: 37.8 % (ref 36.0–46.0)
Hemoglobin: 12.6 g/dL (ref 12.0–15.0)
Immature Granulocytes: 0 %
Lymphocytes Relative: 20 %
Lymphs Abs: 1.9 10*3/uL (ref 0.7–4.0)
MCH: 34.6 pg — ABNORMAL HIGH (ref 26.0–34.0)
MCHC: 33.3 g/dL (ref 30.0–36.0)
MCV: 103.8 fL — ABNORMAL HIGH (ref 80.0–100.0)
Monocytes Absolute: 1 10*3/uL (ref 0.1–1.0)
Monocytes Relative: 10 %
Neutro Abs: 6.5 10*3/uL (ref 1.7–7.7)
Neutrophils Relative %: 70 %
Platelets: 184 10*3/uL (ref 150–400)
RBC: 3.64 MIL/uL — ABNORMAL LOW (ref 3.87–5.11)
RDW: 17.6 % — ABNORMAL HIGH (ref 11.5–15.5)
WBC: 9.5 10*3/uL (ref 4.0–10.5)
nRBC: 0 % (ref 0.0–0.2)

## 2023-06-21 LAB — TSH: TSH: 2.941 u[IU]/mL (ref 0.350–4.500)

## 2023-06-21 LAB — COMPREHENSIVE METABOLIC PANEL WITH GFR
ALT: 20 U/L (ref 0–44)
AST: 28 U/L (ref 15–41)
Albumin: 3.5 g/dL (ref 3.5–5.0)
Alkaline Phosphatase: 47 U/L (ref 38–126)
Anion gap: 14 (ref 5–15)
BUN: 10 mg/dL (ref 6–20)
CO2: 25 mmol/L (ref 22–32)
Calcium: 9.2 mg/dL (ref 8.9–10.3)
Chloride: 96 mmol/L — ABNORMAL LOW (ref 98–111)
Creatinine, Ser: 0.48 mg/dL (ref 0.44–1.00)
GFR, Estimated: >60 mL/min (ref >60)
Glucose, Bld: 86 mg/dL (ref 70–99)
Potassium: 3.6 mmol/L (ref 3.5–5.1)
Sodium: 135 mmol/L (ref 135–145)
Total Bilirubin: 0.4 mg/dL (ref 0.0–1.2)
Total Protein: 6.8 g/dL (ref 6.5–8.1)

## 2023-06-21 LAB — MAGNESIUM: Magnesium: 1.6 mg/dL — ABNORMAL LOW (ref 1.7–2.4)

## 2023-06-21 MED ORDER — SODIUM CHLORIDE 0.9 % IV SOLN
Freq: Once | INTRAVENOUS | Status: AC
Start: 2023-06-21 — End: 2023-06-21

## 2023-06-21 MED ORDER — MAGNESIUM SULFATE 2 GM/50ML IV SOLN
2.0000 g | Freq: Once | INTRAVENOUS | Status: AC
  Administered 2023-06-21: 2 g via INTRAVENOUS
  Filled 2023-06-21: qty 50

## 2023-06-21 MED ORDER — SODIUM CHLORIDE 0.9 % IV SOLN
400.0000 mg/m2 | Freq: Once | INTRAVENOUS | Status: AC
  Administered 2023-06-21: 700 mg via INTRAVENOUS
  Filled 2023-06-21: qty 20

## 2023-06-21 MED ORDER — CYANOCOBALAMIN 1000 MCG/ML IJ SOLN
1000.0000 ug | Freq: Once | INTRAMUSCULAR | Status: AC
  Administered 2023-06-21: 1000 ug via INTRAMUSCULAR
  Filled 2023-06-21: qty 1

## 2023-06-21 MED ORDER — SODIUM CHLORIDE 0.9% FLUSH
10.0000 mL | INTRAVENOUS | Status: DC | PRN
Start: 2023-06-21 — End: 2023-06-21
  Administered 2023-06-21: 10 mL

## 2023-06-21 MED ORDER — MAGNESIUM OXIDE -MG SUPPLEMENT 400 (240 MG) MG PO TABS: 800.0000 mg | ORAL_TABLET | Freq: Two times a day (BID) | ORAL | 4 refills | Status: DC

## 2023-06-21 MED ORDER — PROCHLORPERAZINE MALEATE 10 MG PO TABS
10.0000 mg | ORAL_TABLET | Freq: Once | ORAL | Status: AC
  Administered 2023-06-21: 10 mg via ORAL
  Filled 2023-06-21: qty 1

## 2023-06-21 MED ORDER — HEPARIN SOD (PORK) LOCK FLUSH 100 UNIT/ML IV SOLN
500.0000 [IU] | Freq: Once | INTRAVENOUS | Status: AC | PRN
  Administered 2023-06-21: 500 [IU]

## 2023-06-21 NOTE — Progress Notes (Signed)
 [facility] [address], [place] [address]    Clinic Day:  06/21/2023  Referring physician: Omie Bickers, MD  Patient Care Team: Omie Bickers, MD as PCP - General (Internal Medicine) Riley Cheadle Windsor Hatcher, MD as Consulting Physician (Gastroenterology) Paulett Boros, MD as Consulting Physician (Medical Oncology)   ASSESSMENT & PLAN:   Assessment: 1.  Metastatic lung adenocarcinoma: -PET scan on 05/13/2019 showed right lower lobe nodule concerning for bronchogenic carcinoma.  Cirrhotic liver. -Right lower lobectomy and lymph node excision on 06/19/2019. -Pathology showed 1.7 cm right lower lobe adenocarcinoma, unifocal, lymphovascular invasion present.  Margins negative.  2/14 lymph nodes positive (level 7 and 10R).  PT1CPN2. -PD-L1 30%, K-ras G12A, MSI stable.  EGFR mutation not identified. -4 cycles of adjuvant carboplatin  and pemetrexed  from 09/09/2019 through 11/11/2019. - CT chest on 12/29/2021: Lytic metastatic lesion in the posterior left fifth rib.  Interval enlargement of cavitary nodule of the dependent right upper lobe.  Numerous new clustered nodules of varying sizes. - PET scan (01/19/2022): Cavitary nodule of concern in the right middle lobe measures 1.3 x 0.7 cm, SUV max of 1.8.  No other hypermetabolic or suspicious lung nodules.  Expansile lytic metastasis involving left fifth rib hypermetabolic measuring 2.6 x 1.9 cm with SUV 4.2. - Left rib biopsy (02/06/2022): Metastatic moderately to poorly differentiated adenocarcinoma - NGS by [facility]: PD-L1 (22 C3)-TPS 5%.  TMB-high.  K-ras G12 A pathogenic variant.  T p53 pathogenic variant.  Other targetable mutations negative.  MSI-stable. - XRT to the left rib lesion completed - SBRT to the T5 lesion from 06/12/2022 through 06/16/2022 - Cycle 1 of carboplatin , pemetrexed  and pembrolizumab  on 06/29/2022    Plan: 1.  Metastatic adenocarcinoma of the lung to the left rib: - PET scan (01/04/2023): Resolved  nodularity in the right middle lobe and prior bone metastatic disease has improved. - CT CAP (05/23/2023): Nodularity in the right lung appears similar to decreased from previous exams.  Some residual pleural thickening and trace pleural fluid.  New parenchymal opacity in the right upper lobe as well as scattered in the left lung?  Inflammatory/infectious. - Last treatment with Keytruda  was on 05/10/2023 which was held due to diarrhea. - At last visit I have started her on prednisone  20 mg daily.  She reports diarrhea has gotten better.  Her breathing status is also better. -. Reviewed labs today: Normal LFTs and creatinine.  CBC was normal. - Will decrease prednisone  to 10 mg daily.  She may proceed with pemetrexed  today.  RTC 3 weeks for follow-up.    2.  Left mid back pain: - Continue tramadol  as needed.   3.  Hypomagnesemia: - She is taking magnesium  3 times daily.  Magnesium  today is 1.6.  She will increase magnesium  to 2 tablets twice daily.    No orders of the defined types were placed in this encounter.     Nadeen Augusta Gross,acting as a Neurosurgeon for Paulett Boros, MD.,have documented all relevant documentation on the behalf of Paulett Boros, MD,as directed by  Paulett Boros, MD while in the presence of Paulett Boros, MD.  I, Paulett Boros MD, have reviewed the above documentation for accuracy and completeness, and I agree with the above.      Paulett Boros, MD   5/22/20254:02 PM  CHIEF COMPLAINT:   Diagnosis: metastatic right lung cancer    Cancer Staging  Non-small cell cancer of right lung [facility]) Staging form: Lung, AJCC 8th Edition - Clinical  stage from 02/13/2022: Stage IV (cT1c, cN2, pM1) - Signed by Paulett Boros, MD on 02/13/2022    Prior Therapy: 1. Right lower lobectomy on 06/19/2019. 2. Carboplatin  and pemetrexed  x 4 cycles from 09/09/2019 to 11/11/2019 3. SBRT to left rib/chest wall lesion, completed 03/22/22 4. SBRT to  T5 lesion, 06/12/22 - 06/16/22 5. carboplatin , pemetrexed  and pembrolizumab , 06/29/22 through 08/30/22  Current Therapy:  pemetrexed  and pembrolizumab     HISTORY OF PRESENT ILLNESS:   Oncology History  Adenocarcinoma of lung, stage 3, right (HCC)  07/02/2019 Initial Diagnosis   Adenocarcinoma of lung, stage 3, right (HCC)   07/18/2019 Genetic Testing   [facility]     07/23/2019 Genetic Testing   PD-L1     09/09/2019 - 11/11/2019 Chemotherapy   Patient is on Treatment Plan : LUNG NSCLC Pemetrexed  (Alimta ) / Carboplatin  q21d x 4 cycles     Non-small cell cancer of right lung (HCC)  02/13/2022 Initial Diagnosis   Non-small cell cancer of right lung (HCC)   02/13/2022 Cancer Staging   Staging form: Lung, AJCC 8th Edition - Clinical stage from 02/13/2022: Stage IV (cT1c, cN2, pM1) - Signed by Paulett Boros, MD on 02/13/2022 Histopathologic type: Adenocarcinoma, NOS Stage prefix: Initial diagnosis Histologic grade (G): G3 Histologic grading system: 4 grade system   06/29/2022 -  Chemotherapy   Patient is on Treatment Plan : LUNG Carboplatin  (5) + Pemetrexed  (500) + Pembrolizumab  (200) D1 q21d Induction x 4 cycles / Maintenance Pemetrexed  (500) + Pembrolizumab  (200) D1 q21d        INTERVAL HISTORY:   [name] is a 58 y.o. female presenting to clinic today for follow up of metastatic right lung cancer. She was last seen by me on 05/31/23.  Today, she states that she is doing well overall. Her appetite level is at 100%. Her energy level is at 70%.   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    Bipolar disorder (HCC)    Cancer (HCC)    right lung   COPD (chronic obstructive pulmonary disease) (HCC)    Depression    Dyspnea    Emphysema lung (HCC)    GERD (gastroesophageal reflux disease)     Hepatitis C    s/p treatment with Epclusa   History of HPV infection    History of pneumonia    Pneumonia    Smoker     Surgical History: Past Surgical History:  Procedure Laterality Date   BIOPSY  05/02/2018   Procedure: BIOPSY;  Surgeon: Suzette Espy, MD;  [facility];  Service: Endoscopy;;  gastric   CESAREAN SECTION     CHEST TUBE INSERTION Right 06/26/2019   Procedure: Right CHEST TUBE REPLACEMENT;  Surgeon: Zelphia Higashi, MD;  Location: [facility] OR;  Service: Thoracic;  Laterality: Right;   COLONOSCOPY WITH PROPOFOL  N/A 03/03/2019   Procedure: COLONOSCOPY WITH PROPOFOL ;  Surgeon: Suzette Espy, MD;  Location: [facility];  Service: Endoscopy;  Laterality: N/A;  9:15am   ESOPHAGOGASTRODUODENOSCOPY (EGD) WITH PROPOFOL  N/A 05/02/2018   Procedure: ESOPHAGOGASTRODUODENOSCOPY (EGD) WITH PROPOFOL ;  Surgeon: Suzette Espy, MD;  [facility]: [facility];  Service: Endoscopy;  Laterality: N/A;  8:30am   EYE SURGERY Bilateral    cataract   HEMOSTASIS CLIP PLACEMENT  03/03/2019   Procedure:  HEMOSTASIS CLIP PLACEMENT;  Surgeon: Suzette Espy, MD;  [facility]: [facility];  Service: Endoscopy;;   INTERCOSTAL NERVE BLOCK Right 06/19/2019   Procedure: Intercostal Nerve Block;  Surgeon: Zelphia Higashi, MD;  Location: [facility] OR;  Service: Thoracic;  Laterality: Right;   IR US  GUIDE VASC ACCESS RIGHT  03/13/2018   LOBECTOMY     LYMPH NODE DISSECTION Right 06/19/2019   Procedure: Lymph Node Dissection;  Surgeon: Zelphia Higashi, MD;  Location: [facility] OR;  Service: Thoracic;  Laterality: Right;   POLYPECTOMY  03/03/2019   Procedure: POLYPECTOMY;  Surgeon: Suzette Espy, MD;  [facility];  Service: Endoscopy;;   PORTACATH PLACEMENT Left 09/08/2019   Procedure: INSERTION PORT-A-CATH LEFT CHEST (attached catheter in left subclavian);  Surgeon: Alanda Allegra, MD;  Location: [facility];  Service: General;  Laterality: Left;   TONSILLECTOMY     TOOTH EXTRACTION  01/31/2018    x 7   VIDEO BRONCHOSCOPY WITH INSERTION OF INTERBRONCHIAL VALVE (IBV) N/A 06/26/2019   Procedure: VIDEO BRONCHOSCOPY WITH INSERTION OF TWO INTERBRONCHIAL VALVE (IBV);  Surgeon: Zelphia Higashi, MD;  Location: [facility] OR;  Service: Thoracic;  Laterality: N/A;   VIDEO BRONCHOSCOPY WITH INSERTION OF INTERBRONCHIAL VALVE (IBV) N/A 08/08/2019   Procedure: VIDEO BRONCHOSCOPY WITH REMOVAL OF INTERBRONCHIAL VALVE (IBV);  Surgeon: Zelphia Higashi, MD;  Location: [facility] OR;  Service: Thoracic;  Laterality: N/A;    Social History: Social History   Socioeconomic History   Marital status: Single    Spouse name: Not on file   Number of children: Not on file   Years of education: Not on file   Highest education level: Not on file  Occupational History   Not on file  Tobacco Use   Smoking status: Former    Current packs/day: 0.00    Average packs/day: 0.5 packs/day for 38.0 years (19.0 ttl pk-yrs)    Types: Cigarettes    Start date: 06/18/1981    Quit date: 06/19/2019    Years since quitting: 4.0   Smokeless tobacco: Never  Vaping Use   Vaping status: Never Used  Substance and Sexual Activity   Alcohol use: Not Currently    Comment: Last use of alcohol 03/2016   Drug use: Not Currently    Types: 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   Sexual activity: Not Currently  Other Topics Concern   Not on file  Social History Narrative   Not on file   Social Drivers of Health   Financial Resource Strain: Medium Risk (12/10/2019)   Overall Financial Resource Strain (CARDIA)    Difficulty of Paying Living Expenses: Somewhat hard  Food Insecurity: Food Insecurity Present (12/10/2019)   Hunger Vital Sign    Worried About Running Out of Food in the Last Year: Often true    Ran Out of Food in the Last Year: Often true  Transportation Needs: No Transportation Needs (12/10/2019)   PRAPARE - Administrator, Civil Service (Medical): No    Lack of Transportation (Non-Medical): No  Physical Activity:  Sufficiently Active (12/10/2019)   Exercise Vital Sign    Days of Exercise per Week: 3 days    Minutes of Exercise per Session: 150+ min  Stress: Stress Concern Present (12/10/2019)   [employer] of Occupational Health - Occupational Stress Questionnaire    Feeling of Stress : Very much  Social Connections: Socially Isolated (12/10/2019)   Social Connection and Isolation Panel [NHANES]    Frequency of Communication  with Friends and Family: More than three times a week    Frequency of Social Gatherings with Friends and Family: Once a week    Attends Religious Services: Never    Database administrator or Organizations: No    Attends Banker Meetings: Never    Marital Status: Divorced  Catering manager Violence: Not At Risk (12/10/2019)   Humiliation, Afraid, Rape, and Kick questionnaire    Fear of Current or Ex-Partner: No    Emotionally Abused: No    Physically Abused: No    Sexually Abused: No    Family History: Family History  Problem Relation Age of Onset   Congenital heart disease Mother    Bipolar disorder Mother    Prostate cancer Brother    Alzheimer's disease Paternal Grandmother    Colon cancer Neg Hx     Current Medications:  Current Outpatient Medications:    albuterol  (VENTOLIN  HFA) 108 (90 Base) MCG/ACT inhaler, Inhale 2 puffs into the lungs every 4 (four) hours as needed for wheezing or shortness of breath., Disp: 8 g, Rfl: 3   BREZTRI AEROSPHERE 160-9-4.8 MCG/ACT AERO inhaler, Inhale into the lungs., Disp: , Rfl:    dexamethasone  (DECADRON ) 2 MG tablet, Take 1 tablet (2 mg total) by mouth 2 (two) times daily., Disp: 60 tablet, Rfl: 0   diphenoxylate -atropine  (LOMOTIL ) 2.5-0.025 MG tablet, Take 2 tablets by mouth 4 (four) times daily as needed for diarrhea or loose stools., Disp: 240 tablet, Rfl: 0   divalproex  (DEPAKOTE ) 250 MG DR tablet, Take 250 mg by mouth at bedtime. , Disp: , Rfl:    folic acid  (FOLVITE ) 1 MG tablet, Take 1 tablet (1 mg  total) by mouth daily., Disp: 90 tablet, Rfl: 2   ipratropium-albuterol  (DUONEB) 0.5-2.5 (3) MG/3ML SOLN, USE 1 VIAL VIA NEBULIZER EVERY 6 HOURS AS NEEDED, Disp: 360 mL, Rfl: 3   lidocaine -prilocaine  (EMLA ) cream, Apply a small amount to port a cath site and cover with plastic wrap 1 hour prior to chemotherapy appointments, Disp: 30 g, Rfl: 3   loperamide  (IMODIUM ) 2 MG capsule, Take 1 capsule (2 mg total) by mouth as needed for diarrhea or loose stools (Take 2 capsiles after the first loose stool and then 1 capsule after each loos stool. Do nt exceed 8 capsules in a 24hour period. If it is bedtime and you are having loose stools, take 2 capsules at bedtime and then take 2 capsules every 4 hours until morning.)., Disp: 60 capsule, Rfl: 1   ondansetron  (ZOFRAN ) 4 MG tablet, Take 1 tablet (4 mg total) by mouth every 8 (eight) hours as needed for nausea or vomiting., Disp: 20 tablet, Rfl: 0   pantoprazole  (PROTONIX ) 40 MG tablet, TAKE 1 TABLET BY MOUTH DAILY BEFORE BREAKFAST, Disp: 90 tablet, Rfl: 1   predniSONE  (DELTASONE ) 20 MG tablet, Take 1 tablet (20 mg total) by mouth daily with breakfast., Disp: 30 tablet, Rfl: 1   prochlorperazine  (COMPAZINE ) 10 MG tablet, Take 1 tablet (10 mg total) by mouth every 6 (six) hours as needed for nausea or vomiting., Disp: 60 tablet, Rfl: 5   risperiDONE  (RISPERDAL ) 2 MG tablet, Take 2 mg by mouth at bedtime. , Disp: , Rfl:    SYMBICORT  160-4.5 MCG/ACT inhaler, Inhale 2 puffs into the lungs 2 (two) times daily., Disp: 1 each, Rfl: 6   traMADol  (ULTRAM ) 50 MG tablet, Take 1 tablet (50 mg total) by mouth 2 (two) times daily as needed., Disp: 60 tablet, Rfl: 0  traZODone (DESYREL) 100 MG tablet, Take by mouth., Disp: , Rfl:    magnesium  oxide (MAG-OX) 400 (240 Mg) MG tablet, Take 2 tablets (800 mg total) by mouth 2 (two) times daily., Disp: 120 tablet, Rfl: 4 No current facility-administered medications for this visit.  Facility-Administered Medications Ordered in  Other Visits:    sodium chloride  flush (NS) 0.9 % injection 10 mL, 10 mL, Intracatheter, PRN, Elise Knobloch, MD, 10 mL at 06/21/23 1218   Allergies: Allergies  Allergen Reactions   Folic Acid  Anaphylaxis    Not entirely clear if it is from folic acid . She is taking folic acid  now and doesn't have the throat swelling anymore   Penicillins Anaphylaxis    Throat swelled Did it involve swelling of the face/tongue/throat, SOB, or low BP? Yes Did it involve sudden or severe rash/hives, skin peeling, or any reaction on the inside of your mouth or nose? No Did you need to seek medical attention at a hospital or doctor's office? No When did it last happen?      childhood allergy If all above answers are "NO", may proceed with cephalosporin use.    Amoxicillin     hallucinations Did it involve swelling of the face/tongue/throat, SOB, or low BP? No Did it involve sudden or severe rash/hives, skin peeling, or any reaction on the inside of your mouth or nose? No Did you need to seek medical attention at a hospital or doctor's office? Yes When did it last happen?      July 2019 If all above answers are "NO", may proceed with cephalosporin use.    Carboplatin  Nausea Only, Other (See Comments) and Cough    Patient complaints of feeling hot and flushed. See progress note from 7/11. Patient able to complete infusion after additional medications given.  Fingers itch, nausea Second reaction on 08/30/2022; included itching of hands and nausea; see progress note from 08/30/2022   Fish Allergy Nausea Only   Other Swelling    Patient states that the sunrise brand folic acid  made her feel like her throat was swelling.     REVIEW OF SYSTEMS:   Review of Systems  Constitutional:  Negative for chills, fatigue and fever.  HENT:   Negative for lump/mass, mouth sores, nosebleeds, sore throat and trouble swallowing.   Eyes:  Negative for eye problems.  Respiratory:  Positive for cough and shortness of  breath.   Cardiovascular:  Negative for chest pain, leg swelling and palpitations.  Gastrointestinal:  Negative for abdominal pain, constipation, diarrhea, nausea and vomiting.  Genitourinary:  Negative for bladder incontinence, difficulty urinating, dysuria, frequency, hematuria and nocturia.   Musculoskeletal:  Negative for arthralgias, back pain, flank pain, myalgias and neck pain.  Skin:  Negative for itching and rash.  Neurological:  Positive for headaches. Negative for dizziness and numbness.  Hematological:  Does not bruise/bleed easily.  Psychiatric/Behavioral:  Negative for depression, sleep disturbance and suicidal ideas. The patient is not nervous/anxious.   All other systems reviewed and are negative.    VITALS:   Weight 148 lb 13 oz (67.5 kg).  Wt Readings from Last 3 Encounters:  06/21/23 148 lb 13 oz (67.5 kg)  05/31/23 144 lb 2.9 oz (65.4 kg)  05/10/23 144 lb 13.5 oz (65.7 kg)    Body mass index is 28.12 kg/m.  Performance status (ECOG): 1 - Symptomatic but completely ambulatory  PHYSICAL EXAM:   Physical Exam Vitals and nursing note reviewed. Exam conducted with a chaperone present.  Constitutional:  Appearance: Normal appearance.  Cardiovascular:     Rate and Rhythm: Normal rate and regular rhythm.     Pulses: Normal pulses.     Heart sounds: Normal heart sounds.  Pulmonary:     Effort: Pulmonary effort is normal.     Breath sounds: Normal breath sounds.  Abdominal:     Palpations: Abdomen is soft. There is no hepatomegaly, splenomegaly or mass.     Tenderness: There is no abdominal tenderness.  Musculoskeletal:     Right lower leg: No edema.     Left lower leg: No edema.  Lymphadenopathy:     Cervical: No cervical adenopathy.     Right cervical: No superficial, deep or posterior cervical adenopathy.    Left cervical: No superficial, deep or posterior cervical adenopathy.     Upper Body:     Right upper body: No supraclavicular or axillary  adenopathy.     Left upper body: No supraclavicular or axillary adenopathy.  Neurological:     General: No focal deficit present.     Mental Status: She is alert and oriented to person, place, and time.  Psychiatric:        Mood and Affect: Mood normal.        Behavior: Behavior normal.     LABS:   CBC     Component Value Date/Time   WBC 9.5 06/21/2023 0942   RBC 3.64 (L) 06/21/2023 0942   HGB 12.6 06/21/2023 0942   HCT 37.8 06/21/2023 0942   PLT 184 06/21/2023 0942   MCV 103.8 (H) 06/21/2023 0942   MCH 34.6 (H) 06/21/2023 0942   MCHC 33.3 06/21/2023 0942   RDW 17.6 (H) 06/21/2023 0942   LYMPHSABS 1.9 06/21/2023 0942   MONOABS 1.0 06/21/2023 0942   EOSABS 0.0 06/21/2023 0942   BASOSABS 0.0 06/21/2023 0942    CMP      Component Value Date/Time   NA 135 06/21/2023 0942   K 3.6 06/21/2023 0942   CL 96 (L) 06/21/2023 0942   CO2 25 06/21/2023 0942   GLUCOSE 86 06/21/2023 0942   BUN 10 06/21/2023 0942   CREATININE 0.48 06/21/2023 0942   CREATININE 0.45 (L) 09/10/2017 1422   CALCIUM 9.2 06/21/2023 0942   PROT 6.8 06/21/2023 0942   ALBUMIN 3.5 06/21/2023 0942   AST 28 06/21/2023 0942   ALT 20 06/21/2023 0942   ALKPHOS 47 06/21/2023 0942   BILITOT 0.4 06/21/2023 0942   GFRNONAA >60 06/21/2023 0942   GFRAA >60 10/21/2019 0942     No results found for: "CEA1", "CEA" / No results found for: "CEA1", "CEA" No results found for: "PSA1" No results found for: "QMV784" No results found for: "CAN125"  No results found for: "TOTALPROTELP", "ALBUMINELP", "A1GS", "A2GS", "BETS", "BETA2SER", "GAMS", "MSPIKE", "SPEI" Lab Results  Component Value Date   TIBC 362 01/17/2018   FERRITIN 136 01/17/2018   IRONPCTSAT 40 (H) 01/17/2018   Lab Results  Component Value Date   LDH 146 05/01/2019   LDH 168 11/28/2017   LDH 185 08/24/2017     STUDIES:   CT CHEST ABDOMEN PELVIS W CONTRAST Result Date: 05/29/2023 CLINICAL DATA:  Lung adenocarcinoma. Staging. * Tracking Code: BO  * EXAM: CT CHEST, ABDOMEN, AND PELVIS WITH CONTRAST TECHNIQUE: Multidetector CT imaging of the chest, abdomen and pelvis was performed following the standard protocol during bolus administration of intravenous contrast. RADIATION DOSE REDUCTION: This exam was performed according to the departmental dose-optimization program which includes automated exposure control, adjustment of the  mA and/or kV according to patient size and/or use of iterative reconstruction technique. CONTRAST:  OMNIPAQUE  IOHEXOL  300 MG/ML  SOLN COMPARISON:  Chest CT 05/29/2022 and older.  PET-CT 01/04/2023. FINDINGS: CT CHEST FINDINGS Cardiovascular: Left IJ chest port. Port is accessed the tip of the catheter extends to the SVC right atrial junction. Heart is nonenlarged. No pericardial effusion. Coronary artery calcifications are seen. Please correlate for other coronary risk factors. The thoracic aorta is normal course and caliber with scattered calcified plaque. Plaque also seen along the great vessels. Mediastinum/Nodes: Small thyroid  gland. Normal caliber thoracic esophagus. No specific abnormal lymph node enlargement identified in the axillary regions, hilum. Small lymph node anterior to the right main bronchus which on the prior CT measured 15 x 10 mm, today on series 2, image 23 measures 14 x 9 mm. Lesion was not hypermetabolic on prior PET-CT. No new nodal enlargement in the mediastinum. There is some mediastinal mild soft tissue edema. Lungs/Pleura: Right lung has some apical areas of opacity and pleural thickening. Paraseptal changes at the right lung apex. Tiny pleural effusion at the medial right lung base is again noted and unchanged. Surgical changes of right lower lobectomy. The nodular areas in the right lower lobe seen on the remote CT scan of April 2024 at 16 x 8 mm is smaller today measuring 14 by 6 mm on image 86 of series 4. Is also less confluent. Nodule right lung previously measuring 7 mm, today measures 8 mm on  image 80. Nodule right lower lung peripherally measuring 4 mm on image 87 is stable. No new right-sided lung nodules. Left lung has increasing lateral areas of pleural thickening with a very small pleural effusion. There are bandlike areas of opacity identified developing in both the left lower lung and upper lobes. Areas more towards the lingula and medial left upper lobe as well as along the lateral left lower lobe. There is some fluid tracking along the interlobar fissure as well. Changes are indeterminate. These could be infectious or inflammatory. Please correlate clinical presentation and recommend short follow-up. Musculoskeletal: Once again there is a destructive lesion along the posterolateral aspect of the left fifth rib. Also some lucencies along the fourth rib. These may be progressive from previous. Patchy areas of disease also seen along the midthoracic spine. Mild sclerosis along the sternum. Old right-sided rib fracture. CT ABDOMEN PELVIS FINDINGS Hepatobiliary: Slight nodular contour to the liver. Please correlate for any history of chronic liver disease. Gallbladder is nondilated. Patent portal vein. No space-occupying liver lesion. Pancreas: Unremarkable. No pancreatic ductal dilatation or surrounding inflammatory changes. Spleen: Normal in size without focal abnormality. Adrenals/Urinary Tract: Stable slight nodularity of the adrenal glands, left greater than right. Mild bilateral renal atrophy. Punctate nonobstructing lower pole left-sided renal stone. No enhancing renal mass or collecting system dilatation. Ureters have normal course and caliber extending down to the urinary bladder. Bladder is underdistended but has slight wall thickening, nonspecific. Stomach/Bowel: Stomach and small bowel are nondilated. Large bowel is normal course and caliber. Diffuse colonic stool. Scattered colonic diverticula. Normal appendix. Vascular/Lymphatic: Aortic atherosclerosis. No enlarged abdominal or pelvic  lymph nodes. Reproductive: Uterus is present with a small dystrophic calcification, possible calcified fibroid. No adnexal mass Other: Anasarca. Trace mesenteric haziness. Small calcification seen along the central pelvic mesentery. Possibly a calcified lymph node. Unchanged from previous. Musculoskeletal: Slight curvature of the spine. Degenerative changes of the spine and pelvis. Injection granulomas. Multilevel mild disc bulge. There is some sclerosis involving the S1  vertebral level. IMPRESSION: Surgical changes of right lower lobectomy. The areas of nodularity in the right lung appears similar to decreased from previous examinations. Some residual pleural thickening and trace pleural fluid. However there are developing parenchymal areas of opacity which are bandlike at the medial right upper lobe as well as scattered in the left lung with increasing small left pleural effusion and pleural thickening. These areas would have a differential including infectious or inflammatory process but a more aggressive etiologies in the differential. 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 There is some subtle irregularity of the fourth rib as well and some subtle sclerosis along the S1 vertebral level as well as elsewhere. Overall with the changes short follow up CT scan in 3 months could be performed versus a PET-CT. Coronary calcifications.  Nodular liver.  Colonic diverticula. Electronically Signed   By: Adrianna Horde M.D.   On: 05/29/2023 14:23

## 2023-06-21 NOTE — Patient Instructions (Signed)
 [facility] at [facility] Discharge Instructions   You were seen and examined today by Dr. Cheree Cords.  He reviewed the results of your lab work which are normal/stable.   Dr. Linnell Richardson wants you to cut back on the prednisone  to 1/2 tablet daily.   We sent a prescription for magnesium  to your pharmacy. Pick it up and take as prescribed.   We will proceed with your treatment today.   Return as scheduled.    Thank you for choosing [facility] at [facility] to provide your oncology and hematology care.  To afford each patient quality time with our provider, please arrive at least 15 minutes before your scheduled appointment time.   If you have a lab appointment with the Cancer Center please come in thru the Main Entrance and check in at the main information desk.  You need to re-schedule your appointment should you arrive 10 or more minutes late.  We strive to give you quality time with our providers, and arriving late affects you and other patients whose appointments are after yours.  Also, if you no show three or more times for appointments you may be dismissed from the clinic at the providers discretion.     Again, thank you for choosing [facility].  Our hope is that these requests will decrease the amount of time that you wait before being seen by our physicians.       _____________________________________________________________  Should you have questions after your visit to [facility], please contact our office at [phone] and follow the prompts.  Our office hours are 8:00 a.m. and 4:30 p.m. Monday - Friday.  Please note that voicemails left after 4:00 p.m. may not be returned until the following business day.  We are closed weekends and major holidays.  You do have access to a nurse 24-7, just call the main number to the clinic [phone] and do not press any options, hold on the line and a nurse will answer the  phone.    For prescription refill requests, have your pharmacy contact our office and allow 72 hours.    Due to Covid, you will need to wear a mask upon entering the hospital. If you do not have a mask, a mask will be given to you at the Main Entrance upon arrival. For doctor visits, patients may have 1 support person age 42 or older with them. For treatment visits, patients can not have anyone with them due to social distancing guidelines and our immunocompromised population.

## 2023-06-21 NOTE — Progress Notes (Signed)
 Patient presents today for Keytruda /Alimta  infusion per providers order.  Vital signs and labs reviewed by MD.  Message received from Donzell Gallery RN/Dr. Cheree Cords patient okay for treatment, hold Keytruda  today.  Per standing order patient will receive 2 grams magnesium  IV for Magnesium  level of 1.6.  Treatment given today per MD orders.  Stable during infusion without adverse affects.  Vital signs stable.  No complaints at this time.  Discharge from clinic ambulatory in stable condition.  Alert and oriented X 3.  Follow up with [facility] as scheduled.

## 2023-06-21 NOTE — Patient Instructions (Signed)
 [facility] - A DEPT OF [facility]  Discharge Instructions: Thank you for choosing [facility] to provide your oncology and hematology care.  If you have a lab appointment with the Cancer Center - please note that after April 8th, 2024, all labs will be drawn in the cancer center.  You do not have to check in or register with the main entrance as you have in the past but will complete your check-in in the cancer center.  Wear comfortable clothing and clothing appropriate for easy access to any Portacath or PICC line.   We strive to give you quality time with your provider. You may need to reschedule your appointment if you arrive late (15 or more minutes).  Arriving late affects you and other patients whose appointments are after yours.  Also, if you miss three or more appointments without notifying the office, you may be dismissed from the clinic at the provider's discretion.      For prescription refill requests, have your pharmacy contact our office and allow 72 hours for refills to be completed.    Today you received the following chemotherapy and/or immunotherapy agents Alimta      To help prevent nausea and vomiting after your treatment, we encourage you to take your nausea medication as directed.  BELOW ARE SYMPTOMS THAT SHOULD BE REPORTED IMMEDIATELY: *FEVER GREATER THAN 100.4 F (38 C) OR HIGHER *CHILLS OR SWEATING *NAUSEA AND VOMITING THAT IS NOT CONTROLLED WITH YOUR NAUSEA MEDICATION *UNUSUAL SHORTNESS OF BREATH *UNUSUAL BRUISING OR BLEEDING *URINARY PROBLEMS (pain or burning when urinating, or frequent urination) *BOWEL PROBLEMS (unusual diarrhea, constipation, pain near the anus) TENDERNESS IN MOUTH AND THROAT WITH OR WITHOUT PRESENCE OF ULCERS (sore throat, sores in mouth, or a toothache) UNUSUAL RASH, SWELLING OR PAIN  UNUSUAL VAGINAL DISCHARGE OR ITCHING   Items with * indicate a potential emergency and should be followed up as  soon as possible or go to the Emergency Department if any problems should occur.  Please show the CHEMOTHERAPY ALERT CARD or IMMUNOTHERAPY ALERT CARD at check-in to the Emergency Department and triage nurse.  Should you have questions after your visit or need to cancel or reschedule your appointment, please contact [facility] - A DEPT OF [facility] [phone]  and follow the prompts.  Office hours are 8:00 a.m. to 4:30 p.m. Monday - Friday. Please note that voicemails left after 4:00 p.m. may not be returned until the following business day.  We are closed weekends and major holidays. You have access to a nurse at all times for urgent questions. Please call the main number to the clinic [phone] and follow the prompts.  For any non-urgent questions, you may also contact your provider using MyChart. We now offer e-Visits for anyone 74 and older to request care online for non-urgent symptoms. For details visit [email or web address].   Also download the MyChart app! Go to the app store, search "MyChart", open the app, select [place], and log in with your MyChart username and password.

## 2023-06-21 NOTE — Progress Notes (Signed)
 Patient has been examined by Dr. Cheree Cords. Vital signs and labs have been reviewed by MD - ANC, Creatinine, LFTs, hemoglobin, and platelets are within treatment parameters per M.D. - pt may proceed with treatment. Continue to hold Keytruda  per MD. Primary RN and pharmacy notified.

## 2023-06-22 ENCOUNTER — Other Ambulatory Visit: Payer: Self-pay

## 2023-06-22 LAB — T4: T4, Total: 9.2 ug/dL (ref 4.5–12.0)

## 2023-06-24 ENCOUNTER — Other Ambulatory Visit: Payer: Self-pay

## 2023-07-11 ENCOUNTER — Encounter: Payer: Self-pay | Admitting: Hematology

## 2023-07-12 ENCOUNTER — Inpatient Hospital Stay (HOSPITAL_BASED_OUTPATIENT_CLINIC_OR_DEPARTMENT_OTHER): Admitting: Hematology

## 2023-07-12 ENCOUNTER — Inpatient Hospital Stay: Attending: Hematology

## 2023-07-12 ENCOUNTER — Inpatient Hospital Stay

## 2023-07-12 ENCOUNTER — Other Ambulatory Visit: Payer: Self-pay | Admitting: *Deleted

## 2023-07-12 VITALS — BP 90/60 | Wt 147.5 lb

## 2023-07-12 DIAGNOSIS — C3431 Malignant neoplasm of lower lobe, right bronchus or lung: Secondary | ICD-10-CM | POA: Diagnosis present

## 2023-07-12 DIAGNOSIS — C7951 Secondary malignant neoplasm of bone: Secondary | ICD-10-CM | POA: Insufficient documentation

## 2023-07-12 DIAGNOSIS — C3491 Malignant neoplasm of unspecified part of right bronchus or lung: Secondary | ICD-10-CM

## 2023-07-12 DIAGNOSIS — M546 Pain in thoracic spine: Secondary | ICD-10-CM | POA: Diagnosis not present

## 2023-07-12 LAB — COMPREHENSIVE METABOLIC PANEL WITH GFR
ALT: 31 U/L (ref 0–44)
AST: 91 U/L — ABNORMAL HIGH (ref 15–41)
Albumin: 2.7 g/dL — ABNORMAL LOW (ref 3.5–5.0)
Alkaline Phosphatase: 51 U/L (ref 38–126)
Anion gap: 10 (ref 5–15)
BUN: 9 mg/dL (ref 6–20)
CO2: 29 mmol/L (ref 22–32)
Calcium: 8.5 mg/dL — ABNORMAL LOW (ref 8.9–10.3)
Chloride: 95 mmol/L — ABNORMAL LOW (ref 98–111)
Creatinine, Ser: 0.4 mg/dL — ABNORMAL LOW (ref 0.44–1.00)
GFR, Estimated: >60 mL/min (ref >60)
Glucose, Bld: 101 mg/dL — ABNORMAL HIGH (ref 70–99)
Potassium: 3.4 mmol/L — ABNORMAL LOW (ref 3.5–5.1)
Sodium: 134 mmol/L — ABNORMAL LOW (ref 135–145)
Total Bilirubin: 0.7 mg/dL (ref 0.0–1.2)
Total Protein: 6.4 g/dL — ABNORMAL LOW (ref 6.5–8.1)

## 2023-07-12 LAB — CBC WITH DIFFERENTIAL/PLATELET
Abs Immature Granulocytes: 0.05 10*3/uL (ref 0.00–0.07)
Basophils Absolute: 0 10*3/uL (ref 0.0–0.1)
Basophils Relative: 0 %
Eosinophils Absolute: 0 10*3/uL (ref 0.0–0.5)
Eosinophils Relative: 1 %
HCT: 29.6 % — ABNORMAL LOW (ref 36.0–46.0)
Hemoglobin: 10.1 g/dL — ABNORMAL LOW (ref 12.0–15.0)
Immature Granulocytes: 1 %
Lymphocytes Relative: 5 %
Lymphs Abs: 0.4 10*3/uL — ABNORMAL LOW (ref 0.7–4.0)
MCH: 35.4 pg — ABNORMAL HIGH (ref 26.0–34.0)
MCHC: 34.1 g/dL (ref 30.0–36.0)
MCV: 103.9 fL — ABNORMAL HIGH (ref 80.0–100.0)
Monocytes Absolute: 0.5 10*3/uL (ref 0.1–1.0)
Monocytes Relative: 6 %
Neutro Abs: 6.3 10*3/uL (ref 1.7–7.7)
Neutrophils Relative %: 87 %
Platelets: 250 10*3/uL (ref 150–400)
RBC: 2.85 MIL/uL — ABNORMAL LOW (ref 3.87–5.11)
RDW: 17.2 % — ABNORMAL HIGH (ref 11.5–15.5)
WBC: 7.3 10*3/uL (ref 4.0–10.5)
nRBC: 0 % (ref 0.0–0.2)

## 2023-07-12 LAB — MAGNESIUM: Magnesium: 1.7 mg/dL (ref 1.7–2.4)

## 2023-07-12 MED ORDER — HEPARIN SOD (PORK) LOCK FLUSH 100 UNIT/ML IV SOLN
500.0000 [IU] | Freq: Once | INTRAVENOUS | Status: AC
  Administered 2023-07-12: 500 [IU] via INTRAVENOUS

## 2023-07-12 MED ORDER — SODIUM CHLORIDE 0.9% FLUSH
10.0000 mL | INTRAVENOUS | Status: AC
  Administered 2023-07-12: 10 mL

## 2023-07-12 MED ORDER — SODIUM CHLORIDE 0.9% FLUSH
10.0000 mL | INTRAVENOUS | Status: DC | PRN
  Administered 2023-07-12: 10 mL via INTRAVENOUS

## 2023-07-12 MED ORDER — HYDROCODONE-ACETAMINOPHEN 5-325 MG PO TABS: 1.0000 | ORAL_TABLET | Freq: Two times a day (BID) | ORAL | 0 refills | Status: AC | PRN

## 2023-07-12 MED ORDER — MAGNESIUM SULFATE 2 GM/50ML IV SOLN
2.0000 g | Freq: Once | INTRAVENOUS | Status: AC
  Administered 2023-07-12: 2 g via INTRAVENOUS
  Filled 2023-07-12: qty 50

## 2023-07-12 MED ORDER — POTASSIUM CHLORIDE IN NACL 20-0.9 MEQ/L-% IV SOLN
Freq: Once | INTRAVENOUS | Status: AC
  Filled 2023-07-12: qty 1000

## 2023-07-12 NOTE — Progress Notes (Signed)
 [facility] [address], [place] [address]    Clinic Day:  07/12/2023  Referring physician: Omie Bickers, MD  Patient Care Team: Omie Bickers, MD as PCP - General (Internal Medicine) Riley Cheadle Windsor Hatcher, MD as Consulting Physician (Gastroenterology) Paulett Boros, MD as Consulting Physician (Medical Oncology)   ASSESSMENT & PLAN:   Assessment: 1.  Metastatic lung adenocarcinoma: -PET scan on 05/13/2019 showed right lower lobe nodule concerning for bronchogenic carcinoma.  Cirrhotic liver. -Right lower lobectomy and lymph node excision on 06/19/2019. -Pathology showed 1.7 cm right lower lobe adenocarcinoma, unifocal, lymphovascular invasion present.  Margins negative.  2/14 lymph nodes positive (level 7 and 10R).  PT1CPN2. -PD-L1 30%, K-ras G12A, MSI stable.  EGFR mutation not identified. -4 cycles of adjuvant carboplatin  and pemetrexed  from 09/09/2019 through 11/11/2019. - CT chest on 12/29/2021: Lytic metastatic lesion in the posterior left fifth rib.  Interval enlargement of cavitary nodule of the dependent right upper lobe.  Numerous new clustered nodules of varying sizes. - PET scan (01/19/2022): Cavitary nodule of concern in the right middle lobe measures 1.3 x 0.7 cm, SUV max of 1.8.  No other hypermetabolic or suspicious lung nodules.  Expansile lytic metastasis involving left fifth rib hypermetabolic measuring 2.6 x 1.9 cm with SUV 4.2. - Left rib biopsy (02/06/2022): Metastatic moderately to poorly differentiated adenocarcinoma - NGS by [facility]: PD-L1 (22 C3)-TPS 5%.  TMB-high.  K-ras G12 A pathogenic variant.  T p53 pathogenic variant.  Other targetable mutations negative.  MSI-stable. - XRT to the left rib lesion completed - SBRT to the T5 lesion from 06/12/2022 through 06/16/2022 - Cycle 1 of carboplatin , pemetrexed  and pembrolizumab  on 06/29/2022.  Last treatment with Keytruda  was on 02/13/2023, held due to diarrhea.    Plan: 1.  Metastatic adenocarcinoma  of the lung to the left rib: - PET scan (01/04/2023): Resolved nodularity in the right middle lobe and prior bone metastatic disease has improved. - CT CAP (05/23/2023): Nodularity in the right lung appears similar to decreased from previous exams.  Some residual pleural thickening and trace pleural fluid.  New parenchymal opacity in the right upper lobe as well as scattered in the left lung?  Inflammatory/infectious. - Last treatment with Keytruda  was on 02/13/2023 which was held due to diarrhea. - We are discontinuing pemetrexed  every 3 weeks.  Today she feels weak.  She reportedly had UTI and is being treated now.  LFTs show elevated AST of 91.  CBC grossly normal.  TSH is 2.9. - She reports diarrhea on and off, watery up to 2 times per day and occasionally in the middle of the night.  These episodes happen twice a week.  She takes Imodium  which helps.  She stopped taking prednisone .  At last visit I have decreased prednisone  to 10 mg daily, we are slowly weaning of ICI induced diarrhea. - She was told to restart prednisone  10 mg daily. - She is hypotensive today with blood pressure 84/60.  Repeat manual showed 90/60.  She has some dizziness.  Will hold her treatment today.  Will give her fluids 1 L over 2 hours..  We will reevaluate her after 3 weeks.  I will likely do scans after her next cycle in 3 weeks.    2.  Left mid back pain: - She is taking tramadol  50 mg twice daily as needed.  She also reported worsening arthritic pains.  Tramadol  is no longer helping.  Will switch her to hydrocodone  5/325 twice daily  as needed.   3.  Hypomagnesemia: - Continue magnesium  2 tablets twice daily.  Magnesium  is normal today.    No orders of the defined types were placed in this encounter.     Nadeen Augusta Teague,acting as a Neurosurgeon for Paulett Boros, MD.,have documented all relevant documentation on the behalf of Paulett Boros, MD,as directed by  Paulett Boros, MD while in the presence of  Paulett Boros, MD.  I, Paulett Boros MD, have reviewed the above documentation for accuracy and completeness, and I agree with the above.     Paulett Boros, MD   6/12/202512:59 PM  CHIEF COMPLAINT:   Diagnosis: metastatic right lung cancer    Cancer Staging  Non-small cell cancer of right lung [facility]) Staging form: Lung, AJCC 8th Edition - Clinical stage from 02/13/2022: Stage IV (cT1c, cN2, pM1) - Signed by Paulett Boros, MD on 02/13/2022    Prior Therapy: 1. Right lower lobectomy on 06/19/2019. 2. Carboplatin  and pemetrexed  x 4 cycles from 09/09/2019 to 11/11/2019 3. SBRT to left rib/chest wall lesion, completed 03/22/22 4. SBRT to T5 lesion, 06/12/22 - 06/16/22 5. carboplatin , pemetrexed  and pembrolizumab , 06/29/22 through 08/30/22  Current Therapy:  pemetrexed  and pembrolizumab     HISTORY OF PRESENT ILLNESS:   Oncology History  Adenocarcinoma of lung, stage 3, right (HCC)  07/02/2019 Initial Diagnosis   Adenocarcinoma of lung, stage 3, right (HCC)   07/18/2019 Genetic Testing   Foundation One     07/23/2019 Genetic Testing   PD-L1     09/09/2019 - 11/11/2019 Chemotherapy   Patient is on Treatment Plan : LUNG NSCLC Pemetrexed  (Alimta ) / Carboplatin  q21d x 4 cycles     Non-small cell cancer of right lung (HCC)  02/13/2022 Initial Diagnosis   Non-small cell cancer of right lung (HCC)   02/13/2022 Cancer Staging   Staging form: Lung, AJCC 8th Edition - Clinical stage from 02/13/2022: Stage IV (cT1c, cN2, pM1) - Signed by Paulett Boros, MD on 02/13/2022 Histopathologic type: Adenocarcinoma, NOS Stage prefix: Initial diagnosis Histologic grade (G): G3 Histologic grading system: 4 grade system   06/29/2022 -  Chemotherapy   Patient is on Treatment Plan : LUNG Carboplatin  (5) + Pemetrexed  (500) + Pembrolizumab  (200) D1 q21d Induction x 4 cycles / Maintenance Pemetrexed  (500) + Pembrolizumab  (200) D1 q21d        INTERVAL HISTORY:   Rachel Gross is a  58 y.o. female presenting to clinic today for follow up of metastatic right lung cancer. She was last seen by me on 06/21/23.  Her appetite level is at 75%. Her energy level is at 0%.   Estle reports arthralgias in her right hip, bilateral ankle pain and swelling, and decreased energy. She states she currently has a kidney infection.   She tolerated her last treatment relatively well. Bular notes decreased appetite levels and diarrhea due to treatment. She would like something to boost her appetite. Diarrhea occurs on and off 2 times a week of 2 times a day in the middle of the night with watery stools. She is taking imodium  to treat diarrhea without much improvement.   Purvi is not taking Prednisone . She is taking folic acid  and magnesium  as prescribed.   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    Bipolar disorder (HCC)    Cancer (  HCC)    right lung   COPD (chronic obstructive pulmonary disease) (HCC)    Depression    Dyspnea    Emphysema lung (HCC)    GERD (gastroesophageal reflux disease)    Hepatitis C    s/p treatment with Epclusa   History of HPV infection    History of pneumonia    Pneumonia    Smoker     Surgical History: Past Surgical History:  Procedure Laterality Date   BIOPSY  05/02/2018   Procedure: BIOPSY;  Surgeon: Suzette Espy, MD;  [facility];  Service: Endoscopy;;  gastric   CESAREAN SECTION     CHEST TUBE INSERTION Right 06/26/2019   Procedure: Right CHEST TUBE REPLACEMENT;  Surgeon: Zelphia Higashi, MD;  Location: [facility] OR;  Service: Thoracic;  Laterality: Right;   COLONOSCOPY WITH PROPOFOL  N/A 03/03/2019   Procedure: COLONOSCOPY WITH PROPOFOL ;  Surgeon: Suzette Espy, MD;  [facility]: [facility];  Service: Endoscopy;  Laterality: N/A;  9:15am    ESOPHAGOGASTRODUODENOSCOPY (EGD) WITH PROPOFOL  N/A 05/02/2018   Procedure: ESOPHAGOGASTRODUODENOSCOPY (EGD) WITH PROPOFOL ;  Surgeon: Suzette Espy, MD;  [facility]: [facility];  Service: Endoscopy;  Laterality: N/A;  8:30am   EYE SURGERY Bilateral    cataract   HEMOSTASIS CLIP PLACEMENT  03/03/2019   Procedure: HEMOSTASIS CLIP PLACEMENT;  Surgeon: Suzette Espy, MD;  [facility]: [facility];  Service: Endoscopy;;   INTERCOSTAL NERVE BLOCK Right 06/19/2019   Procedure: Intercostal Nerve Block;  Surgeon: Zelphia Higashi, MD;  Location: [facility] OR;  Service: Thoracic;  Laterality: Right;   IR US  GUIDE VASC ACCESS RIGHT  03/13/2018   LOBECTOMY     LYMPH NODE DISSECTION Right 06/19/2019   Procedure: Lymph Node Dissection;  Surgeon: Zelphia Higashi, MD;  Location: [facility] OR;  Service: Thoracic;  Laterality: Right;   POLYPECTOMY  03/03/2019   Procedure: POLYPECTOMY;  Surgeon: Suzette Espy, MD;  [facility]: [facility];  Service: Endoscopy;;   PORTACATH PLACEMENT Left 09/08/2019   Procedure: INSERTION PORT-A-CATH LEFT CHEST (attached catheter in left subclavian);  Surgeon: Alanda Allegra, MD;  Location: [facility];  Service: General;  Laterality: Left;   TONSILLECTOMY     TOOTH EXTRACTION  01/31/2018   x 7   VIDEO BRONCHOSCOPY WITH INSERTION OF INTERBRONCHIAL VALVE (IBV) N/A 06/26/2019   Procedure: VIDEO BRONCHOSCOPY WITH INSERTION OF TWO INTERBRONCHIAL VALVE (IBV);  Surgeon: Zelphia Higashi, MD;  Location: [facility] OR;  Service: Thoracic;  Laterality: N/A;   VIDEO BRONCHOSCOPY WITH INSERTION OF INTERBRONCHIAL VALVE (IBV) N/A 08/08/2019   Procedure: VIDEO BRONCHOSCOPY WITH REMOVAL OF INTERBRONCHIAL VALVE (IBV);  Surgeon: Zelphia Higashi, MD;  Location: [facility] OR;  Service: Thoracic;  Laterality: N/A;    Social History: Social History   Socioeconomic History   Marital status: Single    Spouse name: Not on file   Number of children: Not on file   Years of education: Not on file   Highest education  level: Not on file  Occupational History   Not on file  Tobacco Use   Smoking status: Former    Current packs/day: 0.00    Average packs/day: 0.5 packs/day for 38.0 years (19.0 ttl pk-yrs)    Types: Cigarettes    Start date: 06/18/1981    Quit date: 06/19/2019    Years since quitting: 4.0   Smokeless tobacco: Never  Vaping Use   Vaping status: Never Used  Substance and Sexual Activity   Alcohol use: Not Currently  Comment: Last use of alcohol 03/2016   Drug use: Not Currently    Types: 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   Sexual activity: Not Currently  Other Topics Concern   Not on file  Social History Narrative   Not on file   Social Drivers of Health   Financial Resource Strain: Medium Risk (12/10/2019)   Overall Financial Resource Strain (CARDIA)    Difficulty of Paying Living Expenses: Somewhat hard  Food Insecurity: Food Insecurity Present (12/10/2019)   Hunger Vital Sign    Worried About Running Out of Food in the Last Year: Often true    Ran Out of Food in the Last Year: Often true  Transportation Needs: No Transportation Needs (12/10/2019)   PRAPARE - Administrator, Civil Service (Medical): No    Lack of Transportation (Non-Medical): No  Physical Activity: Sufficiently Active (12/10/2019)   Exercise Vital Sign    Days of Exercise per Week: 3 days    Minutes of Exercise per Session: 150+ min  Stress: Stress Concern Present (12/10/2019)   [employer] of Occupational Health - Occupational Stress Questionnaire    Feeling of Stress : Very much  Social Connections: Socially Isolated (12/10/2019)   Social Connection and Isolation Panel    Frequency of Communication with Friends and Family: More than three times a week    Frequency of Social Gatherings with Friends and Family: Once a week    Attends Religious Services: Never    Database administrator or Organizations: No    Attends Banker Meetings: Never    Marital Status: Divorced   Catering manager Violence: Not At Risk (12/10/2019)   Humiliation, Afraid, Rape, and Kick questionnaire    Fear of Current or Ex-Partner: No    Emotionally Abused: No    Physically Abused: No    Sexually Abused: No    Family History: Family History  Problem Relation Age of Onset   Congenital heart disease Mother    Bipolar disorder Mother    Prostate cancer Brother    Alzheimer's disease Paternal Grandmother    Colon cancer Neg Hx     Current Medications:  Current Outpatient Medications:    fluconazole (DIFLUCAN) 150 MG tablet, Take by mouth., Disp: , Rfl:    nitrofurantoin , macrocrystal-monohydrate, (MACROBID ) 100 MG capsule, Take 100 mg by mouth every 12 (twelve) hours., Disp: , Rfl:    albuterol  (VENTOLIN  HFA) 108 (90 Base) MCG/ACT inhaler, Inhale 2 puffs into the lungs every 4 (four) hours as needed for wheezing or shortness of breath., Disp: 8 g, Rfl: 3   BREZTRI AEROSPHERE 160-9-4.8 MCG/ACT AERO inhaler, Inhale into the lungs., Disp: , Rfl:    diphenoxylate -atropine  (LOMOTIL ) 2.5-0.025 MG tablet, Take 2 tablets by mouth 4 (four) times daily as needed for diarrhea or loose stools., Disp: 240 tablet, Rfl: 0   divalproex  (DEPAKOTE ) 250 MG DR tablet, Take 250 mg by mouth at bedtime. , Disp: , Rfl:    folic acid  (FOLVITE ) 1 MG tablet, Take 1 tablet (1 mg total) by mouth daily., Disp: 90 tablet, Rfl: 2   ipratropium-albuterol  (DUONEB) 0.5-2.5 (3) MG/3ML SOLN, USE 1 VIAL VIA NEBULIZER EVERY 6 HOURS AS NEEDED, Disp: 360 mL, Rfl: 3   lidocaine -prilocaine  (EMLA ) cream, Apply a small amount to port a cath site and cover with plastic wrap 1 hour prior to chemotherapy appointments, Disp: 30 g, Rfl: 3   loperamide  (IMODIUM ) 2 MG capsule, Take 1 capsule (2 mg total) by mouth  as needed for diarrhea or loose stools (Take 2 capsiles after the first loose stool and then 1 capsule after each loos stool. Do nt exceed 8 capsules in a 24hour period. If it is bedtime and you are having loose stools,  take 2 capsules at bedtime and then take 2 capsules every 4 hours until morning.)., Disp: 60 capsule, Rfl: 1   magnesium  oxide (MAG-OX) 400 (240 Mg) MG tablet, Take 2 tablets (800 mg total) by mouth 2 (two) times daily., Disp: 120 tablet, Rfl: 4   ondansetron  (ZOFRAN ) 4 MG tablet, Take 1 tablet (4 mg total) by mouth every 8 (eight) hours as needed for nausea or vomiting., Disp: 20 tablet, Rfl: 0   pantoprazole  (PROTONIX ) 40 MG tablet, TAKE 1 TABLET BY MOUTH DAILY BEFORE BREAKFAST, Disp: 90 tablet, Rfl: 1   predniSONE  (DELTASONE ) 20 MG tablet, Take 1 tablet (20 mg total) by mouth daily with breakfast., Disp: 30 tablet, Rfl: 1   prochlorperazine  (COMPAZINE ) 10 MG tablet, Take 1 tablet (10 mg total) by mouth every 6 (six) hours as needed for nausea or vomiting., Disp: 60 tablet, Rfl: 5   risperiDONE  (RISPERDAL ) 2 MG tablet, Take 2 mg by mouth at bedtime. , Disp: , Rfl:    SYMBICORT  160-4.5 MCG/ACT inhaler, Inhale 2 puffs into the lungs 2 (two) times daily., Disp: 1 each, Rfl: 6   traMADol  (ULTRAM ) 50 MG tablet, Take 1 tablet (50 mg total) by mouth 2 (two) times daily as needed., Disp: 60 tablet, Rfl: 0   traZODone (DESYREL) 100 MG tablet, Take by mouth., Disp: , Rfl:  No current facility-administered medications for this visit.  Facility-Administered Medications Ordered in Other Visits:    heparin  lock flush 100 unit/mL, 500 Units, Intravenous, Once, Paulett Boros, MD   sodium chloride  flush (NS) 0.9 % injection 10 mL, 10 mL, Intravenous, PRN, Vasti Yagi, MD   Allergies: Allergies  Allergen Reactions   Folic Acid  Anaphylaxis    Not entirely clear if it is from folic acid . She is taking folic acid  now and doesn't have the throat swelling anymore   Penicillins Anaphylaxis    Throat swelled Did it involve swelling of the face/tongue/throat, SOB, or low BP? Yes Did it involve sudden or severe rash/hives, skin peeling, or any reaction on the inside of your mouth or nose? No Did you  need to seek medical attention at a hospital or doctor's office? No When did it last happen?      childhood allergy If all above answers are "NO", may proceed with cephalosporin use.    Amoxicillin     hallucinations Did it involve swelling of the face/tongue/throat, SOB, or low BP? No Did it involve sudden or severe rash/hives, skin peeling, or any reaction on the inside of your mouth or nose? No Did you need to seek medical attention at a hospital or doctor's office? Yes When did it last happen?      July 2019 If all above answers are "NO", may proceed with cephalosporin use.    Carboplatin  Nausea Only, Other (See Comments) and Cough    Patient complaints of feeling hot and flushed. See progress note from 7/11. Patient able to complete infusion after additional medications given.  Fingers itch, nausea Second reaction on 08/30/2022; included itching of hands and nausea; see progress note from 08/30/2022   Fish Allergy Nausea Only   Other Swelling    Patient states that the sunrise brand folic acid  made her feel like her throat was swelling.  REVIEW OF SYSTEMS:   Review of Systems  Constitutional:  Negative for chills, fatigue and fever.  HENT:   Positive for trouble swallowing. Negative for lump/mass, mouth sores, nosebleeds and sore throat.   Eyes:  Negative for eye problems.  Respiratory:  Positive for cough and shortness of breath.   Cardiovascular:  Negative for chest pain, leg swelling and palpitations.  Gastrointestinal:  Positive for diarrhea. Negative for abdominal pain, constipation, nausea and vomiting.  Genitourinary:  Positive for frequency. Negative for bladder incontinence, difficulty urinating, dysuria, hematuria and nocturia.   Musculoskeletal:  Positive for arthralgias (in right hip, 10/10 severity). Negative for back pain, flank pain, myalgias and neck pain.       +bilateral leg pain, 10/10 severity  Skin:  Negative for itching and rash.  Neurological:  Positive  for dizziness, headaches and numbness (and tingling in hands and feet).  Hematological:  Does not bruise/bleed easily.  Psychiatric/Behavioral:  Negative for depression, sleep disturbance and suicidal ideas. The patient is not nervous/anxious.   All other systems reviewed and are negative.    VITALS:   Blood pressure 90/60, weight 147 lb 7.8 oz (66.9 kg).  Wt Readings from Last 3 Encounters:  07/12/23 147 lb 7.8 oz (66.9 kg)  06/21/23 148 lb 13 oz (67.5 kg)  05/31/23 144 lb 2.9 oz (65.4 kg)    Body mass index is 27.87 kg/m.  Performance status (ECOG): 1 - Symptomatic but completely ambulatory  PHYSICAL EXAM:   Physical Exam Vitals and nursing note reviewed. Exam conducted with a chaperone present.  Constitutional:      Appearance: Normal appearance.   Cardiovascular:     Rate and Rhythm: Normal rate and regular rhythm.     Pulses: Normal pulses.     Heart sounds: Normal heart sounds.  Pulmonary:     Effort: Pulmonary effort is normal.     Breath sounds: Normal breath sounds.  Abdominal:     Palpations: Abdomen is soft. There is no hepatomegaly, splenomegaly or mass.     Tenderness: There is no abdominal tenderness.   Musculoskeletal:     Right lower leg: No edema.     Left lower leg: No edema.  Lymphadenopathy:     Cervical: No cervical adenopathy.     Right cervical: No superficial, deep or posterior cervical adenopathy.    Left cervical: No superficial, deep or posterior cervical adenopathy.     Upper Body:     Right upper body: No supraclavicular or axillary adenopathy.     Left upper body: No supraclavicular or axillary adenopathy.   Neurological:     General: No focal deficit present.     Mental Status: She is alert and oriented to person, place, and time.   Psychiatric:        Mood and Affect: Mood normal.        Behavior: Behavior normal.     LABS:   CBC     Component Value Date/Time   WBC 7.3 07/12/2023 0937   RBC 2.85 (L) 07/12/2023 0937    HGB 10.1 (L) 07/12/2023 0937   HCT 29.6 (L) 07/12/2023 0937   PLT 250 07/12/2023 0937   MCV 103.9 (H) 07/12/2023 0937   MCH 35.4 (H) 07/12/2023 0937   MCHC 34.1 07/12/2023 0937   RDW 17.2 (H) 07/12/2023 0937   LYMPHSABS 0.4 (L) 07/12/2023 0937   MONOABS 0.5 07/12/2023 0937   EOSABS 0.0 07/12/2023 0937   BASOSABS 0.0 07/12/2023 0937    CMP  Component Value Date/Time   NA 134 (L) 07/12/2023 0937   K 3.4 (L) 07/12/2023 0937   CL 95 (L) 07/12/2023 0937   CO2 29 07/12/2023 0937   GLUCOSE 101 (H) 07/12/2023 0937   BUN 9 07/12/2023 0937   CREATININE 0.40 (L) 07/12/2023 0937   CREATININE 0.45 (L) 09/10/2017 1422   CALCIUM 8.5 (L) 07/12/2023 0937   PROT 6.4 (L) 07/12/2023 0937   ALBUMIN 2.7 (L) 07/12/2023 0937   AST 91 (H) 07/12/2023 0937   ALT 31 07/12/2023 0937   ALKPHOS 51 07/12/2023 0937   BILITOT 0.7 07/12/2023 0937   GFRNONAA >60 07/12/2023 0937   GFRAA >60 10/21/2019 0942     No results found for: CEA1, CEA / No results found for: CEA1, CEA No results found for: PSA1 No results found for: VQQ595 No results found for: CAN125  No results found for: TOTALPROTELP, ALBUMINELP, A1GS, A2GS, BETS, BETA2SER, GAMS, MSPIKE, SPEI Lab Results  Component Value Date   TIBC 362 01/17/2018   FERRITIN 136 01/17/2018   IRONPCTSAT 40 (H) 01/17/2018   Lab Results  Component Value Date   LDH 146 05/01/2019   LDH 168 11/28/2017   LDH 185 08/24/2017     STUDIES:   No results found.

## 2023-07-12 NOTE — Patient Instructions (Signed)
 [facility] at [facility] Discharge Instructions   You were seen and examined today by Dr. Cheree Cords.  He reviewed the results of your lab work which are normal/stable.  We will hold your treatment today since your blood pressure is low and you are not feeling well.   Restart prednisone  1/2 tablet daily. Take every day until you see Dr. Linnell Richardson in 3 weeks. Use imodium  as needed for diarrhea.    Return as scheduled.    Thank you for choosing [facility] at [facility] to provide your oncology and hematology care.  To afford each patient quality time with our provider, please arrive at least 15 minutes before your scheduled appointment time.   If you have a lab appointment with the Cancer Center please come in thru the Main Entrance and check in at the main information desk.  You need to re-schedule your appointment should you arrive 10 or more minutes late.  We strive to give you quality time with our providers, and arriving late affects you and other patients whose appointments are after yours.  Also, if you no show three or more times for appointments you may be dismissed from the clinic at the providers discretion.     Again, thank you for choosing [facility].  Our hope is that these requests will decrease the amount of time that you wait before being seen by our physicians.       _____________________________________________________________  Should you have questions after your visit to [facility], please contact our office at [phone] and follow the prompts.  Our office hours are 8:00 a.m. and 4:30 p.m. Monday - Friday.  Please note that voicemails left after 4:00 p.m. may not be returned until the following business day.  We are closed weekends and major holidays.  You do have access to a nurse 24-7, just call the main number to the clinic [phone] and do not press any options, hold on the line and a nurse will  answer the phone.    For prescription refill requests, have your pharmacy contact our office and allow 72 hours.    Due to Covid, you will need to wear a mask upon entering the hospital. If you do not have a mask, a mask will be given to you at the Main Entrance upon arrival. For doctor visits, patients may have 1 support person age 35 or older with them. For treatment visits, patients can not have anyone with them due to social distancing guidelines and our immunocompromised population.

## 2023-07-12 NOTE — Progress Notes (Signed)
 Patient presents today for treatment per provider's order. Patient's blood pressure noted to be 84/49 today. We will hold treatment today and give 1 Liter of house fluids over 2 hours per Dr.Katragadda.  Discharged from clinic ambulatory in stable condition. Alert and oriented x 3. F/U with [facility] as scheduled.

## 2023-07-12 NOTE — Patient Instructions (Signed)
 [facility] [place] - A DEPT OF [place]  Discharge Instructions: Thank you for choosing [facility] to provide your oncology and hematology care.  If you have a lab appointment with the Cancer Center - please note that after April 8th, 2024, all labs will be drawn in the cancer center.  You do not have to check in or register with the main entrance as you have in the past but will complete your check-in in the cancer center.  Wear comfortable clothing and clothing appropriate for easy access to any Portacath or PICC line.   We strive to give you quality time with your provider. You may need to reschedule your appointment if you arrive late (15 or more minutes).  Arriving late affects you and other patients whose appointments are after yours.  Also, if you miss three or more appointments without notifying the office, you may be dismissed from the clinic at the provider's discretion.      For prescription refill requests, have your pharmacy contact our office and allow 72 hours for refills to be completed.    Today you received House fluids     BELOW ARE SYMPTOMS THAT SHOULD BE REPORTED IMMEDIATELY: *FEVER GREATER THAN 100.4 F (38 C) OR HIGHER *CHILLS OR SWEATING *NAUSEA AND VOMITING THAT IS NOT CONTROLLED WITH YOUR NAUSEA MEDICATION *UNUSUAL SHORTNESS OF BREATH *UNUSUAL BRUISING OR BLEEDING *URINARY PROBLEMS (pain or burning when urinating, or frequent urination) *BOWEL PROBLEMS (unusual diarrhea, constipation, pain near the anus) TENDERNESS IN MOUTH AND THROAT WITH OR WITHOUT PRESENCE OF ULCERS (sore throat, sores in mouth, or a toothache) UNUSUAL RASH, SWELLING OR PAIN  UNUSUAL VAGINAL DISCHARGE OR ITCHING   Items with * indicate a potential emergency and should be followed up as soon as possible or go to the Emergency Department if any problems should occur.  Please show the CHEMOTHERAPY ALERT CARD or IMMUNOTHERAPY ALERT CARD at check-in to  the Emergency Department and triage nurse.  Should you have questions after your visit or need to cancel or reschedule your appointment, please contact [facility] [place] - A DEPT OF [facility] [phone]  and follow the prompts.  Office hours are 8:00 a.m. to 4:30 p.m. Monday - Friday. Please note that voicemails left after 4:00 p.m. may not be returned until the following business day.  We are closed weekends and major holidays. You have access to a nurse at all times for urgent questions. Please call the main number to the clinic [phone] and follow the prompts.  For any non-urgent questions, you may also contact your provider using MyChart. We now offer e-Visits for anyone 54 and older to request care online for non-urgent symptoms. For details visit [email or web address].   Also download the MyChart app! Go to the app store, search MyChart, open the app, select [place], and log in with your MyChart username and password.

## 2023-07-13 ENCOUNTER — Other Ambulatory Visit: Payer: Self-pay

## 2023-07-14 ENCOUNTER — Other Ambulatory Visit: Payer: Self-pay

## 2023-07-15 ENCOUNTER — Other Ambulatory Visit: Payer: Self-pay

## 2023-07-25 ENCOUNTER — Encounter: Payer: Self-pay | Admitting: *Deleted

## 2023-07-31 ENCOUNTER — Other Ambulatory Visit: Payer: Self-pay

## 2023-08-01 ENCOUNTER — Other Ambulatory Visit: Payer: Self-pay | Admitting: Hematology

## 2023-08-02 ENCOUNTER — Inpatient Hospital Stay: Attending: Hematology

## 2023-08-02 ENCOUNTER — Inpatient Hospital Stay

## 2023-08-02 ENCOUNTER — Inpatient Hospital Stay (HOSPITAL_BASED_OUTPATIENT_CLINIC_OR_DEPARTMENT_OTHER): Admitting: Hematology

## 2023-08-02 VITALS — BP 101/52 | HR 74 | Temp 98.1°F | Resp 20

## 2023-08-02 DIAGNOSIS — C3431 Malignant neoplasm of lower lobe, right bronchus or lung: Secondary | ICD-10-CM | POA: Insufficient documentation

## 2023-08-02 DIAGNOSIS — Z5111 Encounter for antineoplastic chemotherapy: Secondary | ICD-10-CM | POA: Diagnosis present

## 2023-08-02 DIAGNOSIS — C3491 Malignant neoplasm of unspecified part of right bronchus or lung: Secondary | ICD-10-CM

## 2023-08-02 DIAGNOSIS — C7951 Secondary malignant neoplasm of bone: Secondary | ICD-10-CM | POA: Diagnosis not present

## 2023-08-02 DIAGNOSIS — K7460 Unspecified cirrhosis of liver: Secondary | ICD-10-CM | POA: Insufficient documentation

## 2023-08-02 DIAGNOSIS — M546 Pain in thoracic spine: Secondary | ICD-10-CM | POA: Insufficient documentation

## 2023-08-02 DIAGNOSIS — I959 Hypotension, unspecified: Secondary | ICD-10-CM

## 2023-08-02 LAB — COMPREHENSIVE METABOLIC PANEL WITH GFR
ALT: 10 U/L (ref 0–44)
AST: 31 U/L (ref 15–41)
Albumin: 2.9 g/dL — ABNORMAL LOW (ref 3.5–5.0)
Alkaline Phosphatase: 50 U/L (ref 38–126)
Anion gap: 11 (ref 5–15)
BUN: <5 mg/dL — ABNORMAL LOW (ref 6–20)
CO2: 27 mmol/L (ref 22–32)
Calcium: 8.9 mg/dL (ref 8.9–10.3)
Chloride: 99 mmol/L (ref 98–111)
Creatinine, Ser: 0.54 mg/dL (ref 0.44–1.00)
GFR, Estimated: >60 mL/min (ref >60)
Glucose, Bld: 101 mg/dL — ABNORMAL HIGH (ref 70–99)
Potassium: 3.5 mmol/L (ref 3.5–5.1)
Sodium: 137 mmol/L (ref 135–145)
Total Bilirubin: 0.4 mg/dL (ref 0.0–1.2)
Total Protein: 6.5 g/dL (ref 6.5–8.1)

## 2023-08-02 LAB — CBC WITH DIFFERENTIAL/PLATELET
Abs Immature Granulocytes: 0.02 10*3/uL (ref 0.00–0.07)
Basophils Absolute: 0 10*3/uL (ref 0.0–0.1)
Basophils Relative: 1 %
Eosinophils Absolute: 0 10*3/uL (ref 0.0–0.5)
Eosinophils Relative: 1 %
HCT: 34.2 % — ABNORMAL LOW (ref 36.0–46.0)
Hemoglobin: 11.2 g/dL — ABNORMAL LOW (ref 12.0–15.0)
Immature Granulocytes: 0 %
Lymphocytes Relative: 20 %
Lymphs Abs: 1 10*3/uL (ref 0.7–4.0)
MCH: 34.6 pg — ABNORMAL HIGH (ref 26.0–34.0)
MCHC: 32.7 g/dL (ref 30.0–36.0)
MCV: 105.6 fL — ABNORMAL HIGH (ref 80.0–100.0)
Monocytes Absolute: 0.7 10*3/uL (ref 0.1–1.0)
Monocytes Relative: 15 %
Neutro Abs: 3.2 10*3/uL (ref 1.7–7.7)
Neutrophils Relative %: 63 %
Platelets: 229 10*3/uL (ref 150–400)
RBC: 3.24 MIL/uL — ABNORMAL LOW (ref 3.87–5.11)
RDW: 14.9 % (ref 11.5–15.5)
WBC: 5 10*3/uL (ref 4.0–10.5)
nRBC: 0 % (ref 0.0–0.2)

## 2023-08-02 LAB — MAGNESIUM: Magnesium: 1.4 mg/dL — ABNORMAL LOW (ref 1.7–2.4)

## 2023-08-02 MED ORDER — SODIUM CHLORIDE 0.9 % IV SOLN
400.0000 mg/m2 | Freq: Once | INTRAVENOUS | Status: AC
  Administered 2023-08-02: 700 mg via INTRAVENOUS
  Filled 2023-08-02: qty 20

## 2023-08-02 MED ORDER — SODIUM CHLORIDE 0.9 % IV SOLN: INTRAVENOUS | Status: DC

## 2023-08-02 MED ORDER — HEPARIN SOD (PORK) LOCK FLUSH 100 UNIT/ML IV SOLN
500.0000 [IU] | Freq: Once | INTRAVENOUS | Status: AC | PRN
Start: 2023-08-02 — End: 2023-08-02
  Administered 2023-08-02: 500 [IU]

## 2023-08-02 MED ORDER — SODIUM CHLORIDE 0.9% FLUSH
10.0000 mL | INTRAVENOUS | Status: DC | PRN
  Administered 2023-08-02: 10 mL

## 2023-08-02 MED ORDER — SODIUM CHLORIDE 0.9 % IV SOLN: Freq: Once | INTRAVENOUS | Status: AC

## 2023-08-02 MED ORDER — MAGNESIUM SULFATE 2 GM/50ML IV SOLN: 2.0000 g | Freq: Once | INTRAVENOUS | Status: DC

## 2023-08-02 MED ORDER — SODIUM CHLORIDE 0.9% FLUSH
10.0000 mL | Freq: Once | INTRAVENOUS | Status: AC
  Administered 2023-08-02: 10 mL via INTRAVENOUS

## 2023-08-02 MED ORDER — PROCHLORPERAZINE MALEATE 10 MG PO TABS
10.0000 mg | ORAL_TABLET | Freq: Once | ORAL | Status: AC
  Administered 2023-08-02: 10 mg via ORAL
  Filled 2023-08-02: qty 1

## 2023-08-02 MED ORDER — CYANOCOBALAMIN 1000 MCG/ML IJ SOLN: 1000.0000 ug | Freq: Once | INTRAMUSCULAR | Status: DC

## 2023-08-02 MED ORDER — MAGNESIUM SULFATE 4 GM/100ML IV SOLN
4.0000 g | Freq: Once | INTRAVENOUS | Status: AC
  Administered 2023-08-02: 4 g via INTRAVENOUS
  Filled 2023-08-02: qty 100

## 2023-08-02 NOTE — Progress Notes (Signed)
 Patient has been examined by Dr. Ellin Saba. Vital signs and labs have been reviewed by MD - ANC, Creatinine, LFTs, hemoglobin, and platelets are within treatment parameters per M.D. - pt may proceed with treatment.  Primary RN and pharmacy notified.

## 2023-08-02 NOTE — Patient Instructions (Signed)
 [facility] [place] - A DEPT OF [place]  Discharge Instructions: Thank you for choosing [facility] to provide your oncology and hematology care.  If you have a lab appointment with the Cancer Center - please note that after April 8th, 2024, all labs will be drawn in the cancer center.  You do not have to check in or register with the main entrance as you have in the past but will complete your check-in in the cancer center.  Wear comfortable clothing and clothing appropriate for easy access to any Portacath or PICC line.   We strive to give you quality time with your provider. You may need to reschedule your appointment if you arrive late (15 or more minutes).  Arriving late affects you and other patients whose appointments are after yours.  Also, if you miss three or more appointments without notifying the office, you may be dismissed from the clinic at the provider's discretion.      For prescription refill requests, have your pharmacy contact our office and allow 72 hours for refills to be completed.    Today you received the following chemotherapy and/or immunotherapy agents Alimta  only   To help prevent nausea and vomiting after your treatment, we encourage you to take your nausea medication as directed.  BELOW ARE SYMPTOMS THAT SHOULD BE REPORTED IMMEDIATELY: *FEVER GREATER THAN 100.4 F (38 C) OR HIGHER *CHILLS OR SWEATING *NAUSEA AND VOMITING THAT IS NOT CONTROLLED WITH YOUR NAUSEA MEDICATION *UNUSUAL SHORTNESS OF BREATH *UNUSUAL BRUISING OR BLEEDING *URINARY PROBLEMS (pain or burning when urinating, or frequent urination) *BOWEL PROBLEMS (unusual diarrhea, constipation, pain near the anus) TENDERNESS IN MOUTH AND THROAT WITH OR WITHOUT PRESENCE OF ULCERS (sore throat, sores in mouth, or a toothache) UNUSUAL RASH, SWELLING OR PAIN  UNUSUAL VAGINAL DISCHARGE OR ITCHING   Items with * indicate a potential emergency and should be followed up  as soon as possible or go to the Emergency Department if any problems should occur.  Please show the CHEMOTHERAPY ALERT CARD or IMMUNOTHERAPY ALERT CARD at check-in to the Emergency Department and triage nurse.  Should you have questions after your visit or need to cancel or reschedule your appointment, please contact [facility] [place] - A DEPT OF [name] [phone]  and follow the prompts.  Office hours are 8:00 a.m. to 4:30 p.m. Monday - Friday. Please note that voicemails left after 4:00 p.m. may not be returned until the following business day.  We are closed weekends and major holidays. You have access to a nurse at all times for urgent questions. Please call the main number to the clinic [phone] and follow the prompts.  For any non-urgent questions, you may also contact your provider using MyChart. We now offer e-Visits for anyone 67 and older to request care online for non-urgent symptoms. For details visit [email or web address].   Also download the MyChart app! Go to the app store, search MyChart, open the app, select [place], and log in with your MyChart username and password.

## 2023-08-02 NOTE — Progress Notes (Signed)
 Only giving Alimta  today , no Keytruda  per MD.   Blood pressure low at end of tx, will give 500 NS bolus per MD.  Blood pressure rechecked,  101/52. Ok to discharge per MD. Treatment given per orders. Patient tolerated it well without problems. Vitals stable and discharged home from clinic ambulatory. Follow up as scheduled.

## 2023-08-02 NOTE — Progress Notes (Signed)
 [facility] [address], [place] [address]    Clinic Day:  08/15/2023  Referring physician: Shona Norleen PEDLAR, MD  Patient Care Team: Shona Norleen PEDLAR, MD as PCP - General (Internal Medicine) Shaaron Lamar HERO, MD as Consulting Physician (Gastroenterology) Rogers Hai, MD as Consulting Physician (Medical Oncology)   ASSESSMENT & PLAN:   Assessment: 1.  Metastatic lung adenocarcinoma: -PET scan on 05/13/2019 showed right lower lobe nodule concerning for bronchogenic carcinoma.  Cirrhotic liver. -Right lower lobectomy and lymph node excision on 06/19/2019. -Pathology showed 1.7 cm right lower lobe adenocarcinoma, unifocal, lymphovascular invasion present.  Margins negative.  2/14 lymph nodes positive (level 7 and 10R).  PT1CPN2. -PD-L1 30%, K-ras G12A, MSI stable.  EGFR mutation not identified. -4 cycles of adjuvant carboplatin  and pemetrexed  from 09/09/2019 through 11/11/2019. - CT chest on 12/29/2021: Lytic metastatic lesion in the posterior left fifth rib.  Interval enlargement of cavitary nodule of the dependent right upper lobe.  Numerous new clustered nodules of varying sizes. - PET scan (01/19/2022): Cavitary nodule of concern in the right middle lobe measures 1.3 x 0.7 cm, SUV max of 1.8.  No other hypermetabolic or suspicious lung nodules.  Expansile lytic metastasis involving left fifth rib hypermetabolic measuring 2.6 x 1.9 cm with SUV 4.2. - Left rib biopsy (02/06/2022): Metastatic moderately to poorly differentiated adenocarcinoma - NGS by [facility]: PD-L1 (22 C3)-TPS 5%.  TMB-high.  K-ras G12 A pathogenic variant.  T p53 pathogenic variant.  Other targetable mutations negative.  MSI-stable. - XRT to the left rib lesion completed - SBRT to the T5 lesion from 06/12/2022 through 06/16/2022 - Cycle 1 of carboplatin , pemetrexed  and pembrolizumab  on 06/29/2022.  Last treatment with Keytruda  was on 02/13/2023, held due to diarrhea.    Plan: 1.  Metastatic adenocarcinoma  of the lung to the left rib: - PET scan (01/04/2023): Resolved nodularity in the right middle lobe and prior bone metastatic disease has improved. - CT CAP (05/23/2023): Nodularity in the right lung appears similar to decreased from previous exams.  Some residual pleural thickening and trace pleural fluid.  New parenchymal opacity in the right upper lobe as well as scattered in the left lung?  Inflammatory/infectious. - Last treatment with Keytruda  was on 02/13/2023, held due to diarrhea. - We are continuing pemetrexed  every 3 weeks. - After last treatment she had decreased taste although appetite is good.  She is drinking Ensure occasionally.  She lost about 7 pounds. - She is unable to carry heavy oxygen tanks and needs help with transportation.  We will prescribe portable oxygen machine. - I reviewed labs: Normal LFTs.  CBC grossly normal. - She may proceed with treatment today.  RTC 3 weeks for follow-up with repeat CT CAP.    2.  Left mid back pain: - Continue hydrocodone  5/325 twice daily as needed.  Pain is better controlled.   3.  Hypomagnesemia: - Magnesium  is 1.4 today.  She reports that magnesium  pills make her feel choked when swallowing.  She was told to start back on smaller magnesium  2 tablets twice daily.  She will receive IV magnesium .    Orders Placed This Encounter  Procedures   CT CHEST ABDOMEN PELVIS W CONTRAST    Standing Status:   Future    Number of Occurrences:   1    Expected Date:   08/16/2023    Expiration Date:   08/01/2024    If indicated for the ordered procedure, I authorize the administration of  contrast media per Radiology protocol:   Yes    Does the patient have a contrast media/X-ray dye allergy?:   No    Preferred imaging location?:   [facility]    If indicated for the ordered procedure, I authorize the administration of oral contrast media per Radiology protocol:   Yes      I,Helena R Teague,acting as a scribe for Alean Stands, MD.,have  documented all relevant documentation on the behalf of Alean Stands, MD,as directed by  Alean Stands, MD while in the presence of Alean Stands, MD.  I, Alean Stands MD, have reviewed the above documentation for accuracy and completeness, and I agree with the above.     Alean Stands, MD   7/16/202510:10 AM  CHIEF COMPLAINT:   Diagnosis: metastatic right lung cancer    Cancer Staging  Non-small cell cancer of right lung [facility]) Staging form: Lung, AJCC 8th Edition - Clinical stage from 02/13/2022: Stage IV (cT1c, cN2, pM1) - Signed by Stands Alean, MD on 02/13/2022    Prior Therapy: 1. Right lower lobectomy on 06/19/2019. 2. Carboplatin  and pemetrexed  x 4 cycles from 09/09/2019 to 11/11/2019 3. SBRT to left rib/chest wall lesion, completed 03/22/22 4. SBRT to T5 lesion, 06/12/22 - 06/16/22 5. carboplatin , pemetrexed  and pembrolizumab , 06/29/22 through 08/30/22  Current Therapy:  pemetrexed  and pembrolizumab     HISTORY OF PRESENT ILLNESS:   Oncology History  Adenocarcinoma of lung, stage 3, right (HCC)  07/02/2019 Initial Diagnosis   Adenocarcinoma of lung, stage 3, right (HCC)   07/18/2019 Genetic Testing   Foundation One     07/23/2019 Genetic Testing   PD-L1     09/09/2019 - 11/11/2019 Chemotherapy   Patient is on Treatment Plan : LUNG NSCLC Pemetrexed  (Alimta ) / Carboplatin  q21d x 4 cycles     Non-small cell cancer of right lung (HCC)  02/13/2022 Initial Diagnosis   Non-small cell cancer of right lung (HCC)   02/13/2022 Cancer Staging   Staging form: Lung, AJCC 8th Edition - Clinical stage from 02/13/2022: Stage IV (cT1c, cN2, pM1) - Signed by Stands Alean, MD on 02/13/2022 Histopathologic type: Adenocarcinoma, NOS Stage prefix: Initial diagnosis Histologic grade (G): G3 Histologic grading system: 4 grade system   06/29/2022 -  Chemotherapy   Patient is on Treatment Plan : LUNG Carboplatin  (5) + Pemetrexed  (500) +  Pembrolizumab  (200) D1 q21d Induction x 4 cycles / Maintenance Pemetrexed  (500) + Pembrolizumab  (200) D1 q21d        INTERVAL HISTORY:   Rachel Gross is a 58 y.o. female presenting to clinic today for follow up of metastatic right lung cancer. She was last seen by me on 07/12/23.  Today, she states she is doing well overall. Her appetite level is at 40%. Her energy level is at 40%. Rachel Gross states she is supposedly getting an aide in a few weeks for things such as transportation.   She states she does not cook or clean due to SOB. She has difficulty walking from the porch to the truck. Rachel Gross has at home oxygen, though she cannot transport the oxygen tanks when traveling and would like transportable oxygen.   She notes she has a normal appetite, though does not eat much due to loss of taste. She drinks Ensure when it is available for her. Rachel Gross has lost 7 pounds since 07/12/23. She states the nursing aide program will provide consistent Ensure bottles once her home aide has come.   She reports on and off diarrhea. Rachel Gross had one episode  of diarrhea with watery stools yesterday after drinking a cup of grape juice. She is taking Prednisone  every other day and notes increased appetite. She is taking Magnesium  5 pills daily semi-consistently due to difficulty swallowing the pills.   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    Bipolar disorder (HCC)    Cancer (HCC)    right lung   COPD (chronic obstructive pulmonary disease) (HCC)    Depression    Dyspnea    Emphysema lung (HCC)    GERD (gastroesophageal reflux disease)    Hepatitis C    s/p treatment with Epclusa   History of HPV infection    History of pneumonia    Pneumonia    Smoker     Surgical History: Past Surgical History:  Procedure Laterality Date    BIOPSY  05/02/2018   Procedure: BIOPSY;  Surgeon: Shaaron Lamar HERO, MD;  [facility];  Service: Endoscopy;;  gastric   CESAREAN SECTION     CHEST TUBE INSERTION Right 06/26/2019   Procedure: Right CHEST TUBE REPLACEMENT;  Surgeon: Kerrin Elspeth BROCKS, MD;  Location: [facility] OR;  Service: Thoracic;  Laterality: Right;   COLONOSCOPY WITH PROPOFOL  N/A 03/03/2019   Procedure: COLONOSCOPY WITH PROPOFOL ;  Surgeon: Shaaron Lamar HERO, MD;  Location: [facility];  Service: Endoscopy;  Laterality: N/A;  9:15am   ESOPHAGOGASTRODUODENOSCOPY (EGD) WITH PROPOFOL  N/A 05/02/2018   Procedure: ESOPHAGOGASTRODUODENOSCOPY (EGD) WITH PROPOFOL ;  Surgeon: Shaaron Lamar HERO, MD;  [facility]: [facility];  Service: Endoscopy;  Laterality: N/A;  8:30am   EYE SURGERY Bilateral    cataract   HEMOSTASIS CLIP PLACEMENT  03/03/2019   Procedure: HEMOSTASIS CLIP PLACEMENT;  Surgeon: Shaaron Lamar HERO, MD;  [facility]: [facility];  Service: Endoscopy;;   INTERCOSTAL NERVE BLOCK Right 06/19/2019   Procedure: Intercostal Nerve Block;  Surgeon: Kerrin Elspeth BROCKS, MD;  Location: [facility] OR;  Service: Thoracic;  Laterality: Right;   IR US  GUIDE VASC ACCESS RIGHT  03/13/2018   LOBECTOMY     LYMPH NODE DISSECTION Right 06/19/2019   Procedure: Lymph Node Dissection;  Surgeon: Kerrin Elspeth BROCKS, MD;  Location: [facility] OR;  Service: Thoracic;  Laterality: Right;   POLYPECTOMY  03/03/2019   Procedure: POLYPECTOMY;  Surgeon: Shaaron Lamar HERO, MD;  [facility];  Service: Endoscopy;;   PORTACATH PLACEMENT Left 09/08/2019   Procedure: INSERTION PORT-A-CATH LEFT CHEST (attached catheter in left subclavian);  Surgeon: Mavis Anes, MD;  Location: [facility];  Service: General;  Laterality: Left;   TONSILLECTOMY     TOOTH EXTRACTION  01/31/2018   x 7   VIDEO BRONCHOSCOPY WITH INSERTION OF INTERBRONCHIAL VALVE (IBV) N/A 06/26/2019   Procedure: VIDEO BRONCHOSCOPY WITH INSERTION OF TWO INTERBRONCHIAL VALVE (IBV);  Surgeon: Kerrin Elspeth BROCKS, MD;   Location: [facility] OR;  Service: Thoracic;  Laterality: N/A;   VIDEO BRONCHOSCOPY WITH INSERTION OF INTERBRONCHIAL VALVE (IBV) N/A 08/08/2019   Procedure: VIDEO BRONCHOSCOPY WITH REMOVAL OF INTERBRONCHIAL VALVE (IBV);  Surgeon: Kerrin Elspeth BROCKS, MD;  Location: [facility] OR;  Service: Thoracic;  Laterality: N/A;    Social History: Social History   Socioeconomic History   Marital status: Single    Spouse name: Not on file   Number of children: Not on file   Years of education: Not on  file   Highest education level: Not on file  Occupational History   Not on file  Tobacco Use   Smoking status: Former    Current packs/day: 0.00    Average packs/day: 0.5 packs/day for 38.0 years (19.0 ttl pk-yrs)    Types: Cigarettes    Start date: 06/18/1981    Quit date: 06/19/2019    Years since quitting: 4.1   Smokeless tobacco: Never  Vaping Use   Vaping status: Never Used  Substance and Sexual Activity   Alcohol use: Not Currently    Comment: Last use of alcohol 03/2016   Drug use: Not Currently    Types: 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   Sexual activity: Not Currently  Other Topics Concern   Not on file  Social History Narrative   Not on file   Social Drivers of Health   Financial Resource Strain: Medium Risk (12/10/2019)   Overall Financial Resource Strain (CARDIA)    Difficulty of Paying Living Expenses: Somewhat hard  Food Insecurity: Food Insecurity Present (12/10/2019)   Hunger Vital Sign    Worried About Running Out of Food in the Last Year: Often true    Ran Out of Food in the Last Year: Often true  Transportation Needs: No Transportation Needs (12/10/2019)   PRAPARE - Administrator, Civil Service (Medical): No    Lack of Transportation (Non-Medical): No  Physical Activity: Sufficiently Active (12/10/2019)   Exercise Vital Sign    Days of Exercise per Week: 3 days    Minutes of Exercise per Session: 150+ min  Stress: Stress Concern Present (12/10/2019)   [employer]  of Occupational Health - Occupational Stress Questionnaire    Feeling of Stress : Very much  Social Connections: Socially Isolated (12/10/2019)   Social Connection and Isolation Panel    Frequency of Communication with Friends and Family: More than three times a week    Frequency of Social Gatherings with Friends and Family: Once a week    Attends Religious Services: Never    Database administrator or Organizations: No    Attends Banker Meetings: Never    Marital Status: Divorced  Catering manager Violence: Not At Risk (12/10/2019)   Humiliation, Afraid, Rape, and Kick questionnaire    Fear of Current or Ex-Partner: No    Emotionally Abused: No    Physically Abused: No    Sexually Abused: No    Family History: Family History  Problem Relation Age of Onset   Congenital heart disease Mother    Bipolar disorder Mother    Prostate cancer Brother    Alzheimer's disease Paternal Grandmother    Colon cancer Neg Hx     Current Medications:  Current Outpatient Medications:    albuterol  (VENTOLIN  HFA) 108 (90 Base) MCG/ACT inhaler, Inhale 2 puffs into the lungs every 4 (four) hours as needed for wheezing or shortness of breath., Disp: 8 g, Rfl: 3   BREZTRI AEROSPHERE 160-9-4.8 MCG/ACT AERO inhaler, Inhale into the lungs., Disp: , Rfl:    diphenoxylate -atropine  (LOMOTIL ) 2.5-0.025 MG tablet, Take 2 tablets by mouth 4 (four) times daily as needed for diarrhea or loose stools., Disp: 240 tablet, Rfl: 0   divalproex  (DEPAKOTE ) 250 MG DR tablet, Take 250 mg by mouth at bedtime. , Disp: , Rfl:    fluconazole (DIFLUCAN) 150 MG tablet, Take by mouth., Disp: , Rfl:    folic acid  (FOLVITE ) 1 MG tablet, Take 1 tablet (1 mg total) by  mouth daily., Disp: 90 tablet, Rfl: 2   HYDROcodone -acetaminophen  (NORCO/VICODIN) 5-325 MG tablet, Take 1 tablet by mouth 2 (two) times daily as needed for moderate pain (pain score 4-6)., Disp: 60 tablet, Rfl: 0   ipratropium-albuterol  (DUONEB) 0.5-2.5 (3)  MG/3ML SOLN, USE 1 VIAL VIA NEBULIZER EVERY 6 HOURS AS NEEDED, Disp: 360 mL, Rfl: 3   lidocaine -prilocaine  (EMLA ) cream, Apply a small amount to port a cath site and cover with plastic wrap 1 hour prior to chemotherapy appointments, Disp: 30 g, Rfl: 3   loperamide  (IMODIUM ) 2 MG capsule, Take 1 capsule (2 mg total) by mouth as needed for diarrhea or loose stools (Take 2 capsiles after the first loose stool and then 1 capsule after each loos stool. Do nt exceed 8 capsules in a 24hour period. If it is bedtime and you are having loose stools, take 2 capsules at bedtime and then take 2 capsules every 4 hours until morning.)., Disp: 60 capsule, Rfl: 1   magnesium  oxide (MAG-OX) 400 (240 Mg) MG tablet, Take 2 tablets (800 mg total) by mouth 2 (two) times daily., Disp: 120 tablet, Rfl: 4   nitrofurantoin , macrocrystal-monohydrate, (MACROBID ) 100 MG capsule, Take 100 mg by mouth every 12 (twelve) hours., Disp: , Rfl:    ondansetron  (ZOFRAN ) 4 MG tablet, Take 1 tablet (4 mg total) by mouth every 8 (eight) hours as needed for nausea or vomiting., Disp: 20 tablet, Rfl: 0   pantoprazole  (PROTONIX ) 40 MG tablet, TAKE 1 TABLET BY MOUTH DAILY BEFORE BREAKFAST, Disp: 90 tablet, Rfl: 1   predniSONE  (DELTASONE ) 20 MG tablet, TAKE ONE TABLET BY MOUTH DAILY WITH BREAKFAST, Disp: 30 tablet, Rfl: 1   prochlorperazine  (COMPAZINE ) 10 MG tablet, Take 1 tablet (10 mg total) by mouth every 6 (six) hours as needed for nausea or vomiting., Disp: 60 tablet, Rfl: 5   risperiDONE  (RISPERDAL ) 2 MG tablet, Take 2 mg by mouth at bedtime. , Disp: , Rfl:    SYMBICORT  160-4.5 MCG/ACT inhaler, Inhale 2 puffs into the lungs 2 (two) times daily., Disp: 1 each, Rfl: 6   traMADol  (ULTRAM ) 50 MG tablet, Take 1 tablet (50 mg total) by mouth 2 (two) times daily as needed., Disp: 60 tablet, Rfl: 0   traZODone (DESYREL) 100 MG tablet, Take by mouth., Disp: , Rfl:    Allergies: Allergies  Allergen Reactions   Folic Acid  Anaphylaxis    Not  entirely clear if it is from folic acid . She is taking folic acid  now and doesn't have the throat swelling anymore   Penicillins Anaphylaxis    Throat swelled Did it involve swelling of the face/tongue/throat, SOB, or low BP? Yes Did it involve sudden or severe rash/hives, skin peeling, or any reaction on the inside of your mouth or nose? No Did you need to seek medical attention at a hospital or doctor's office? No When did it last happen?      childhood allergy If all above answers are "NO", may proceed with cephalosporin use.    Amoxicillin     hallucinations Did it involve swelling of the face/tongue/throat, SOB, or low BP? No Did it involve sudden or severe rash/hives, skin peeling, or any reaction on the inside of your mouth or nose? No Did you need to seek medical attention at a hospital or doctor's office? Yes When did it last happen?      July 2019 If all above answers are "NO", may proceed with cephalosporin use.    Carboplatin  Nausea Only, Other (See Comments)  and Cough    Patient complaints of feeling hot and flushed. See progress note from 7/11. Patient able to complete infusion after additional medications given.  Fingers itch, nausea Second reaction on 08/30/2022; included itching of hands and nausea; see progress note from 08/30/2022   Fish Allergy Nausea Only   Other Swelling    Patient states that the sunrise brand folic acid  made her feel like her throat was swelling.     REVIEW OF SYSTEMS:   Review of Systems  Constitutional:  Negative for chills, fatigue and fever.  HENT:   Negative for lump/mass, mouth sores, nosebleeds, sore throat and trouble swallowing.   Eyes:  Negative for eye problems.  Respiratory:  Positive for cough and shortness of breath.   Cardiovascular:  Negative for chest pain, leg swelling and palpitations.  Gastrointestinal:  Positive for constipation and diarrhea. Negative for abdominal pain, nausea and vomiting.  Genitourinary:  Positive for  frequency. Negative for bladder incontinence, difficulty urinating, dysuria, hematuria and nocturia.   Musculoskeletal:  Positive for arthralgias (in bilateral hips, 8/10 severity) and back pain (8/10 severity). Negative for flank pain, myalgias and neck pain.  Skin:  Negative for itching and rash.  Neurological:  Positive for dizziness, headaches and numbness (in hands).  Hematological:  Does not bruise/bleed easily.  Psychiatric/Behavioral:  Negative for depression, sleep disturbance and suicidal ideas. The patient is not nervous/anxious.   All other systems reviewed and are negative.    VITALS:   There were no vitals taken for this visit.  Wt Readings from Last 3 Encounters:  08/02/23 140 lb (63.5 kg)  07/12/23 147 lb 7.8 oz (66.9 kg)  06/21/23 148 lb 13 oz (67.5 kg)    There is no height or weight on file to calculate BMI.  Performance status (ECOG): 1 - Symptomatic but completely ambulatory  PHYSICAL EXAM:   Physical Exam Vitals and nursing note reviewed. Exam conducted with a chaperone present.  Constitutional:      Appearance: Normal appearance.  Cardiovascular:     Rate and Rhythm: Normal rate and regular rhythm.     Pulses: Normal pulses.     Heart sounds: Normal heart sounds.  Pulmonary:     Effort: Pulmonary effort is normal.     Breath sounds: Normal breath sounds.  Abdominal:     Palpations: Abdomen is soft. There is no hepatomegaly, splenomegaly or mass.     Tenderness: There is no abdominal tenderness.  Musculoskeletal:     Right lower leg: No edema.     Left lower leg: No edema.  Lymphadenopathy:     Cervical: No cervical adenopathy.     Right cervical: No superficial, deep or posterior cervical adenopathy.    Left cervical: No superficial, deep or posterior cervical adenopathy.     Upper Body:     Right upper body: No supraclavicular or axillary adenopathy.     Left upper body: No supraclavicular or axillary adenopathy.  Neurological:     General: No  focal deficit present.     Mental Status: She is alert and oriented to person, place, and time.  Psychiatric:        Mood and Affect: Mood normal.        Behavior: Behavior normal.     LABS:   CBC     Component Value Date/Time   WBC 5.0 08/02/2023 0912   RBC 3.24 (L) 08/02/2023 0912   HGB 11.2 (L) 08/02/2023 0912   HCT 34.2 (L) 08/02/2023 0912  PLT 229 08/02/2023 0912   MCV 105.6 (H) 08/02/2023 0912   MCH 34.6 (H) 08/02/2023 0912   MCHC 32.7 08/02/2023 0912   RDW 14.9 08/02/2023 0912   LYMPHSABS 1.0 08/02/2023 0912   MONOABS 0.7 08/02/2023 0912   EOSABS 0.0 08/02/2023 0912   BASOSABS 0.0 08/02/2023 0912    CMP      Component Value Date/Time   NA 137 08/02/2023 0912   K 3.5 08/02/2023 0912   CL 99 08/02/2023 0912   CO2 27 08/02/2023 0912   GLUCOSE 101 (H) 08/02/2023 0912   BUN <5 (L) 08/02/2023 0912   CREATININE 0.54 08/02/2023 0912   CREATININE 0.45 (L) 09/10/2017 1422   CALCIUM 8.9 08/02/2023 0912   PROT 6.5 08/02/2023 0912   ALBUMIN 2.9 (L) 08/02/2023 0912   AST 31 08/02/2023 0912   ALT 10 08/02/2023 0912   ALKPHOS 50 08/02/2023 0912   BILITOT 0.4 08/02/2023 0912   GFRNONAA >60 08/02/2023 0912   GFRAA >60 10/21/2019 0942     No results found for: CEA1, CEA / No results found for: CEA1, CEA No results found for: PSA1 No results found for: CAN199 No results found for: CAN125  No results found for: TOTALPROTELP, ALBUMINELP, A1GS, A2GS, BETS, BETA2SER, GAMS, MSPIKE, SPEI Lab Results  Component Value Date   TIBC 362 01/17/2018   FERRITIN 136 01/17/2018   IRONPCTSAT 40 (H) 01/17/2018   Lab Results  Component Value Date   LDH 146 05/01/2019   LDH 168 11/28/2017   LDH 185 08/24/2017     STUDIES:   No results found.

## 2023-08-14 ENCOUNTER — Ambulatory Visit (HOSPITAL_COMMUNITY)
Admission: RE | Admit: 2023-08-14 | Discharge: 2023-08-14 | Disposition: A | Discharge status: Home / Self Care | Source: Ambulatory Visit | Attending: Hematology | Admitting: Hematology

## 2023-08-14 DIAGNOSIS — C3491 Malignant neoplasm of unspecified part of right bronchus or lung: Secondary | ICD-10-CM | POA: Insufficient documentation

## 2023-08-14 MED ORDER — IOHEXOL 300 MG/ML  SOLN
100.0000 mL | Freq: Once | INTRAMUSCULAR | Status: AC | PRN
  Administered 2023-08-14: 100 mL via INTRAVENOUS

## 2023-08-14 MED ORDER — HEPARIN SOD (PORK) LOCK FLUSH 100 UNIT/ML IV SOLN
500.0000 [IU] | Freq: Once | INTRAVENOUS | Status: AC
  Administered 2023-08-14: 500 [IU] via INTRAVENOUS

## 2023-08-15 ENCOUNTER — Encounter: Payer: Self-pay | Admitting: Hematology

## 2023-08-21 ENCOUNTER — Other Ambulatory Visit: Payer: Self-pay

## 2023-08-23 ENCOUNTER — Inpatient Hospital Stay

## 2023-08-23 ENCOUNTER — Inpatient Hospital Stay (HOSPITAL_BASED_OUTPATIENT_CLINIC_OR_DEPARTMENT_OTHER): Admitting: Hematology

## 2023-08-23 VITALS — BP 106/63 | HR 86 | Ht 61.0 in | Wt 140.1 lb

## 2023-08-23 DIAGNOSIS — C3491 Malignant neoplasm of unspecified part of right bronchus or lung: Secondary | ICD-10-CM | POA: Diagnosis not present

## 2023-08-23 DIAGNOSIS — Z5111 Encounter for antineoplastic chemotherapy: Secondary | ICD-10-CM | POA: Diagnosis not present

## 2023-08-23 LAB — COMPREHENSIVE METABOLIC PANEL WITH GFR
ALT: 11 U/L (ref 0–44)
AST: 23 U/L (ref 15–41)
Albumin: 3.1 g/dL — ABNORMAL LOW (ref 3.5–5.0)
Alkaline Phosphatase: 53 U/L (ref 38–126)
Anion gap: 14 (ref 5–15)
BUN: <5 mg/dL — ABNORMAL LOW (ref 6–20)
CO2: 25 mmol/L (ref 22–32)
Calcium: 8.9 mg/dL (ref 8.9–10.3)
Chloride: 100 mmol/L (ref 98–111)
Creatinine, Ser: 0.53 mg/dL (ref 0.44–1.00)
GFR, Estimated: >60 mL/min (ref >60)
Glucose, Bld: 148 mg/dL — ABNORMAL HIGH (ref 70–99)
Potassium: 3.2 mmol/L — ABNORMAL LOW (ref 3.5–5.1)
Sodium: 139 mmol/L (ref 135–145)
Total Bilirubin: 0.4 mg/dL (ref 0.0–1.2)
Total Protein: 6.7 g/dL (ref 6.5–8.1)

## 2023-08-23 LAB — CBC WITH DIFFERENTIAL/PLATELET
Abs Immature Granulocytes: 0.03 K/uL (ref 0.00–0.07)
Basophils Absolute: 0 K/uL (ref 0.0–0.1)
Basophils Relative: 0 %
Eosinophils Absolute: 0 K/uL (ref 0.0–0.5)
Eosinophils Relative: 0 %
HCT: 34.6 % — ABNORMAL LOW (ref 36.0–46.0)
Hemoglobin: 11.4 g/dL — ABNORMAL LOW (ref 12.0–15.0)
Immature Granulocytes: 0 %
Lymphocytes Relative: 8 %
Lymphs Abs: 0.7 K/uL (ref 0.7–4.0)
MCH: 34.8 pg — ABNORMAL HIGH (ref 26.0–34.0)
MCHC: 32.9 g/dL (ref 30.0–36.0)
MCV: 105.5 fL — ABNORMAL HIGH (ref 80.0–100.0)
Monocytes Absolute: 0.2 K/uL (ref 0.1–1.0)
Monocytes Relative: 3 %
Neutro Abs: 7.8 K/uL — ABNORMAL HIGH (ref 1.7–7.7)
Neutrophils Relative %: 89 %
Platelets: 260 K/uL (ref 150–400)
RBC: 3.28 MIL/uL — ABNORMAL LOW (ref 3.87–5.11)
RDW: 15.2 % (ref 11.5–15.5)
WBC: 8.8 K/uL (ref 4.0–10.5)
nRBC: 0 % (ref 0.0–0.2)

## 2023-08-23 LAB — MAGNESIUM: Magnesium: 1.6 mg/dL — ABNORMAL LOW (ref 1.7–2.4)

## 2023-08-23 MED ORDER — MAGNESIUM SULFATE 2 GM/50ML IV SOLN
2.0000 g | Freq: Once | INTRAVENOUS | Status: AC
  Administered 2023-08-23: 2 g via INTRAVENOUS
  Filled 2023-08-23: qty 50

## 2023-08-23 MED ORDER — SODIUM CHLORIDE 0.9 % IV SOLN
400.0000 mg/m2 | Freq: Once | INTRAVENOUS | Status: AC
  Administered 2023-08-23: 700 mg via INTRAVENOUS
  Filled 2023-08-23: qty 20

## 2023-08-23 MED ORDER — POTASSIUM CHLORIDE CRYS ER 20 MEQ PO TBCR
40.0000 meq | EXTENDED_RELEASE_TABLET | Freq: Once | ORAL | Status: AC
  Administered 2023-08-23: 40 meq via ORAL
  Filled 2023-08-23: qty 2

## 2023-08-23 MED ORDER — HEPARIN SOD (PORK) LOCK FLUSH 100 UNIT/ML IV SOLN
500.0000 [IU] | Freq: Once | INTRAVENOUS | Status: AC | PRN
  Administered 2023-08-23: 500 [IU]

## 2023-08-23 MED ORDER — PROCHLORPERAZINE MALEATE 10 MG PO TABS
10.0000 mg | ORAL_TABLET | Freq: Once | ORAL | Status: AC
  Administered 2023-08-23: 10 mg via ORAL
  Filled 2023-08-23: qty 1

## 2023-08-23 MED ORDER — CYANOCOBALAMIN 1000 MCG/ML IJ SOLN
1000.0000 ug | Freq: Once | INTRAMUSCULAR | Status: AC
  Administered 2023-08-23: 1000 ug via INTRAMUSCULAR
  Filled 2023-08-23: qty 1

## 2023-08-23 MED ORDER — SODIUM CHLORIDE 0.9 % IV SOLN: Freq: Once | INTRAVENOUS | Status: AC

## 2023-08-23 NOTE — Progress Notes (Signed)
 Mag 1.6 and K 3.2. Pt received potassium 40 mEq PO and Magnesium  2g IV per standing orders.   Patient tolerated chemotherapy with no complaints voiced.  Side effects with management reviewed with understanding verbalized.  Port site clean and dry with no bruising or swelling noted at site.  Good blood return noted before and after administration of chemotherapy.  Band aid applied.  Patient left in satisfactory condition with VSS and no s/s of distress noted.   Rachel Gross

## 2023-08-23 NOTE — Patient Instructions (Signed)
 [facility] - A DEPT OF [facility]  Discharge Instructions: Thank you for choosing [facility] to provide your oncology and hematology care.  If you have a lab appointment with the Cancer Center - please note that after April 8th, 2024, all labs will be drawn in the cancer center.  You do not have to check in or register with the main entrance as you have in the past but will complete your check-in in the cancer center.  Wear comfortable clothing and clothing appropriate for easy access to any Portacath or PICC line.   We strive to give you quality time with your provider. You may need to reschedule your appointment if you arrive late (15 or more minutes).  Arriving late affects you and other patients whose appointments are after yours.  Also, if you miss three or more appointments without notifying the office, you may be dismissed from the clinic at the provider's discretion.      For prescription refill requests, have your pharmacy contact our office and allow 72 hours for refills to be completed.    Today you received the following chemotherapy and/or immunotherapy agents alimta    To help prevent nausea and vomiting after your treatment, we encourage you to take your nausea medication as directed.  BELOW ARE SYMPTOMS THAT SHOULD BE REPORTED IMMEDIATELY: *FEVER GREATER THAN 100.4 F (38 C) OR HIGHER *CHILLS OR SWEATING *NAUSEA AND VOMITING THAT IS NOT CONTROLLED WITH YOUR NAUSEA MEDICATION *UNUSUAL SHORTNESS OF BREATH *UNUSUAL BRUISING OR BLEEDING *URINARY PROBLEMS (pain or burning when urinating, or frequent urination) *BOWEL PROBLEMS (unusual diarrhea, constipation, pain near the anus) TENDERNESS IN MOUTH AND THROAT WITH OR WITHOUT PRESENCE OF ULCERS (sore throat, sores in mouth, or a toothache) UNUSUAL RASH, SWELLING OR PAIN  UNUSUAL VAGINAL DISCHARGE OR ITCHING   Items with * indicate a potential emergency and should be followed up as  soon as possible or go to the Emergency Department if any problems should occur.  Please show the CHEMOTHERAPY ALERT CARD or IMMUNOTHERAPY ALERT CARD at check-in to the Emergency Department and triage nurse.  Should you have questions after your visit or need to cancel or reschedule your appointment, please contact [facility] [place] - A DEPT OF [name] [phone]  and follow the prompts.  Office hours are 8:00 a.m. to 4:30 p.m. Monday - Friday. Please note that voicemails left after 4:00 p.m. may not be returned until the following business day.  We are closed weekends and major holidays. You have access to a nurse at all times for urgent questions. Please call the main number to the clinic [phone] and follow the prompts.  For any non-urgent questions, you may also contact your provider using MyChart. We now offer e-Visits for anyone 43 and older to request care online for non-urgent symptoms. For details visit [email or web address].   Also download the MyChart app! Go to the app store, search MyChart, open the app, select [place], and log in with your MyChart username and password.

## 2023-08-23 NOTE — Patient Instructions (Addendum)
 [facility] at [facility] Discharge Instructions   You were seen and examined today by Dr. Rogers.  He reviewed the results of your lab work which are normal/stable.   He reviewed the results of your CT scan that shows the cancer is fully under control.   Change the way you take your prednisone . Take 1/2 pill alternating with 1/4 pill.   We will proceed with your treatment today.   Return as scheduled.    Thank you for choosing [facility] at [facility] to provide your oncology and hematology care.  To afford each patient quality time with our provider, please arrive at least 15 minutes before your scheduled appointment time.   If you have a lab appointment with the Cancer Center please come in thru the Main Entrance and check in at the main information desk.  You need to re-schedule your appointment should you arrive 10 or more minutes late.  We strive to give you quality time with our providers, and arriving late affects you and other patients whose appointments are after yours.  Also, if you no show three or more times for appointments you may be dismissed from the clinic at the providers discretion.     Again, thank you for choosing [facility].  Our hope is that these requests will decrease the amount of time that you wait before being seen by our physicians.       _____________________________________________________________  Should you have questions after your visit to [facility], please contact our office at [phone] and follow the prompts.  Our office hours are 8:00 a.m. and 4:30 p.m. Monday - Friday.  Please note that voicemails left after 4:00 p.m. may not be returned until the following business day.  We are closed weekends and major holidays.  You do have access to a nurse 24-7, just call the main number to the clinic [phone] and do not press any options, hold on the line and a nurse will  answer the phone.    For prescription refill requests, have your pharmacy contact our office and allow 72 hours.    Due to Covid, you will need to wear a mask upon entering the hospital. If you do not have a mask, a mask will be given to you at the Main Entrance upon arrival. For doctor visits, patients may have 1 support person age 41 or older with them. For treatment visits, patients can not have anyone with them due to social distancing guidelines and our immunocompromised population.

## 2023-08-23 NOTE — Progress Notes (Signed)
 [facility] [address] [place] [address]    Clinic Day:  08/23/2023  Referring physician: Shona Norleen PEDLAR, MD  Patient Care Team: Shona Norleen PEDLAR, MD as PCP - General (Internal Medicine) Shaaron Lamar HERO, MD as Consulting Physician (Gastroenterology) Rogers Hai, MD as Consulting Physician (Medical Oncology)   ASSESSMENT & PLAN:   Assessment: 1.  Metastatic lung adenocarcinoma: -PET scan on 05/13/2019 showed right lower lobe nodule concerning for bronchogenic carcinoma.  Cirrhotic liver. -Right lower lobectomy and lymph node excision on 06/19/2019. -Pathology showed 1.7 cm right lower lobe adenocarcinoma, unifocal, lymphovascular invasion present.  Margins negative.  2/14 lymph nodes positive (level 7 and 10R).  PT1CPN2. -PD-L1 30%, K-ras G12A, MSI stable.  EGFR mutation not identified. -4 cycles of adjuvant carboplatin  and pemetrexed  from 09/09/2019 through 11/11/2019. - CT chest on 12/29/2021: Lytic metastatic lesion in the posterior left fifth rib.  Interval enlargement of cavitary nodule of the dependent right upper lobe.  Numerous new clustered nodules of varying sizes. - PET scan (01/19/2022): Cavitary nodule of concern in the right middle lobe measures 1.3 x 0.7 cm, SUV max of 1.8.  No other hypermetabolic or suspicious lung nodules.  Expansile lytic metastasis involving left fifth rib hypermetabolic measuring 2.6 x 1.9 cm with SUV 4.2. - Left rib biopsy (02/06/2022): Metastatic moderately to poorly differentiated adenocarcinoma - NGS by [facility]: PD-L1 (22 C3)-TPS 5%.  TMB-high.  K-ras G12 A pathogenic variant.  T p53 pathogenic variant.  Other targetable mutations negative.  MSI-stable. - XRT to the left rib lesion completed - SBRT to the T5 lesion from 06/12/2022 through 06/16/2022 - Cycle 1 of carboplatin , pemetrexed  and pembrolizumab  on 06/29/2022.  Last treatment with Keytruda  was on 02/13/2023, held due to diarrhea.  She is continuing pemetrexed  every 3  weeks.    Plan: 1.  Metastatic adenocarcinoma of the lung to the left rib: - PET scan (01/04/2023): Resolved nodularity in the right middle lobe and prior bone metastatic disease has improved. - CT CAP (05/23/2023): Nodularity in the right lung appears similar to decreased from previous exams.  Some residual pleural thickening and trace pleural fluid.  New parenchymal opacity in the right upper lobe as well as scattered in the left lung?  Inflammatory/infectious. - We reviewed CT CAP from 08/14/2023: Stable expansile mixed density lesion of the left fifth rib with stable pleural thickening and subpleural nodularity.  No other evidence of metastatic disease. - Labs today: CBC grossly normal.  LFTs are normal.  Creatinine is stable. - She will continue pemetrexed  every 3 weeks until progression. - RTC 6 weeks for follow-up.    2.  Left mid back pain: - Continue hydrocodone  5/325 mg twice daily as needed.  Pain is well-controlled.   3.  Hypomagnesemia: - She is taking magnesium  3 tablets twice daily.  Magnesium  improved to 1.6 today.  4.  ICI induced diarrhea: - She is currently on prednisone  taper at 10 mg daily. - She is having regular bowel movements. - Will decrease prednisone  to 10 mg alternating with 5 mg.    No orders of the defined types were placed in this encounter.     Rachel Gross,acting as a Neurosurgeon for Hai Rogers, MD.,have documented all relevant documentation on the behalf of Hai Rogers, MD,as directed by  Hai Rogers, MD while in the presence of Hai Rogers, MD.  I, Hai Rogers MD, have reviewed the above documentation for accuracy and completeness, and I agree with the above.  Alean Stands, MD   7/24/20252:37 PM  CHIEF COMPLAINT:   Diagnosis: metastatic right lung cancer    Cancer Staging  Non-small cell cancer of right lung [facility]) Staging form: Lung, AJCC 8th Edition - Clinical stage from 02/13/2022:  Stage IV (cT1c, cN2, pM1) - Signed by Stands Alean, MD on 02/13/2022    Prior Therapy: 1. Right lower lobectomy on 06/19/2019. 2. Carboplatin  and pemetrexed  x 4 cycles from 09/09/2019 to 11/11/2019 3. SBRT to left rib/chest wall lesion, completed 03/22/22 4. SBRT to T5 lesion, 06/12/22 - 06/16/22 5. carboplatin , pemetrexed  and pembrolizumab , 06/29/22 through 08/30/22  Current Therapy:  pemetrexed  and pembrolizumab     HISTORY OF PRESENT ILLNESS:   Oncology History  Adenocarcinoma of lung, stage 3, right (HCC)  07/02/2019 Initial Diagnosis   Adenocarcinoma of lung, stage 3, right (HCC)   07/18/2019 Genetic Testing   Foundation One     07/23/2019 Genetic Testing   PD-L1     09/09/2019 - 11/11/2019 Chemotherapy   Patient is on Treatment Plan : LUNG NSCLC Pemetrexed  (Alimta ) / Carboplatin  q21d x 4 cycles     Non-small cell cancer of right lung (HCC)  02/13/2022 Initial Diagnosis   Non-small cell cancer of right lung (HCC)   02/13/2022 Cancer Staging   Staging form: Lung, AJCC 8th Edition - Clinical stage from 02/13/2022: Stage IV (cT1c, cN2, pM1) - Signed by Stands Alean, MD on 02/13/2022 Histopathologic type: Adenocarcinoma, NOS Stage prefix: Initial diagnosis Histologic grade (G): G3 Histologic grading system: 4 grade system   06/29/2022 -  Chemotherapy   Patient is on Treatment Plan : LUNG Carboplatin  (5) + Pemetrexed  (500) + Pembrolizumab  (200) D1 q21d Induction x 4 cycles / Maintenance Pemetrexed  (500) + Pembrolizumab  (200) D1 q21d        INTERVAL HISTORY:   Rachel Gross is a 58 y.o. female presenting to clinic today for follow up of metastatic right lung cancer. She was last seen by me on 08/02/2023.  Since her last visit, she underwent CT CAP on 08/14/2023 that found: Stable appearance of the chest, abdomen, and pelvis. Stable expansile mixed density lesion of the left fifth rib. Stable pleural thickening and subpleural nodularity adjacent to the deformity of the left  fifth rib. Stable confluent interstitial and bandlike opacity anteriorly and laterally in the left lower lobe and in the lingula. Stable mild nodularity in the lungs. Small left and trace right pleural effusion. Cirrhotic hepatic morphology. 2 mm left kidney lower pole nonobstructive renal calculus. Widespread colonic diverticulosis. 10. 3.2 cm in length fat density lipoma or fatty polyp in the transverse duodenum. Mild to moderate cardiomegaly. Aortic Atherosclerosis and Emphysema.   Today, she states she is doing well overall. Her appetite level is at 0%. Her energy level is at 0%. Rachel Gross reports feeling drowsy at this visit. She is tolerating chemotherapy well, though she reports decreased appetite.   She notes swelling on the left side of the face and believes she may have had a TIA.   Rachel Gross also complains of urinary frequency. She is taking folic acid  daily, 3 pills of Magnesium  BID, and prednisone  10 mg daily. Prednisone  increases appetite and improves her breathing. She has dyspnea due to hot weather and states when she gets out of breath she has to urinate.   Rachel Gross had diarrhea this morning. She typically has a BM once a day with normal stools.  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    Bipolar disorder (HCC)    Cancer (HCC)    right lung   COPD (chronic obstructive pulmonary disease) (HCC)    Depression    Dyspnea    Emphysema lung (HCC)    GERD (gastroesophageal reflux disease)    Hepatitis C    s/p treatment with Epclusa   History of HPV infection    History of pneumonia    Pneumonia    Smoker     Surgical History: Past Surgical History:  Procedure Laterality Date   BIOPSY  05/02/2018   Procedure: BIOPSY;  Surgeon: Shaaron Lamar HERO, MD;  [facility];  Service: Endoscopy;;   gastric   CESAREAN SECTION     CHEST TUBE INSERTION Right 06/26/2019   Procedure: Right CHEST TUBE REPLACEMENT;  Surgeon: Kerrin Elspeth BROCKS, MD;  Location: [facility] OR;  Service: Thoracic;  Laterality: Right;   COLONOSCOPY WITH PROPOFOL  N/A 03/03/2019   Procedure: COLONOSCOPY WITH PROPOFOL ;  Surgeon: Shaaron Lamar HERO, MD;  Location: [facility];  Service: Endoscopy;  Laterality: N/A;  9:15am   ESOPHAGOGASTRODUODENOSCOPY (EGD) WITH PROPOFOL  N/A 05/02/2018   Procedure: ESOPHAGOGASTRODUODENOSCOPY (EGD) WITH PROPOFOL ;  Surgeon: Shaaron Lamar HERO, MD;  Location: [facility];  Service: Endoscopy;  Laterality: N/A;  8:30am   EYE SURGERY Bilateral    cataract   HEMOSTASIS CLIP PLACEMENT  03/03/2019   Procedure: HEMOSTASIS CLIP PLACEMENT;  Surgeon: Shaaron Lamar HERO, MD;  Location: [facility];  Service: Endoscopy;;   INTERCOSTAL NERVE BLOCK Right 06/19/2019   Procedure: Intercostal Nerve Block;  Surgeon: Kerrin Elspeth BROCKS, MD;  Location: [facility] OR;  Service: Thoracic;  Laterality: Right;   IR US  GUIDE VASC ACCESS RIGHT  03/13/2018   LOBECTOMY     LYMPH NODE DISSECTION Right 06/19/2019   Procedure: Lymph Node Dissection;  Surgeon: Kerrin Elspeth BROCKS, MD;  Location: [facility] OR;  Service: Thoracic;  Laterality: Right;   POLYPECTOMY  03/03/2019   Procedure: POLYPECTOMY;  Surgeon: Shaaron Lamar HERO, MD;  [facility]: [facility];  Service: Endoscopy;;   PORTACATH PLACEMENT Left 09/08/2019   Procedure: INSERTION PORT-A-CATH LEFT CHEST (attached catheter in left subclavian);  Surgeon: Mavis Anes, MD;  Location: [facility];  Service: General;  Laterality: Left;   TONSILLECTOMY     TOOTH EXTRACTION  01/31/2018   x 7   VIDEO BRONCHOSCOPY WITH INSERTION OF INTERBRONCHIAL VALVE (IBV) N/A 06/26/2019   Procedure: VIDEO BRONCHOSCOPY WITH INSERTION OF TWO INTERBRONCHIAL VALVE (IBV);  Surgeon: Kerrin Elspeth BROCKS, MD;  Location: [facility] OR;  Service: Thoracic;  Laterality: N/A;   VIDEO BRONCHOSCOPY WITH INSERTION OF INTERBRONCHIAL VALVE  (IBV) N/A 08/08/2019   Procedure: VIDEO BRONCHOSCOPY WITH REMOVAL OF INTERBRONCHIAL VALVE (IBV);  Surgeon: Kerrin Elspeth BROCKS, MD;  Location: [facility] OR;  Service: Thoracic;  Laterality: N/A;    Social History: Social History   Socioeconomic History   Marital status: Single    Spouse name: Not on file   Number of children: Not on file   Years of education: Not on file   Highest education level: Not on file  Occupational History   Not on file  Tobacco Use   Smoking status: Former    Current packs/day: 0.00    Average packs/day: 0.5 packs/day for 38.0 years (19.0 ttl pk-yrs)    Types: Cigarettes    Start date: 06/18/1981    Quit date: 06/19/2019  Years since quitting: 4.1   Smokeless tobacco: Never  Vaping Use   Vaping status: Never Used  Substance and Sexual Activity   Alcohol use: Not Currently    Comment: Last use of alcohol 03/2016   Drug use: Not Currently    Types: 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   Sexual activity: Not Currently  Other Topics Concern   Not on file  Social History Narrative   Not on file   Social Drivers of Health   Financial Resource Strain: Medium Risk (12/10/2019)   Overall Financial Resource Strain (CARDIA)    Difficulty of Paying Living Expenses: Somewhat hard  Food Insecurity: Food Insecurity Present (12/10/2019)   Hunger Vital Sign    Worried About Running Out of Food in the Last Year: Often true    Ran Out of Food in the Last Year: Often true  Transportation Needs: No Transportation Needs (12/10/2019)   PRAPARE - Administrator, Civil Service (Medical): No    Lack of Transportation (Non-Medical): No  Physical Activity: Sufficiently Active (12/10/2019)   Exercise Vital Sign    Days of Exercise per Week: 3 days    Minutes of Exercise per Session: 150+ min  Stress: Stress Concern Present (12/10/2019)   [employer] of Occupational Health - Occupational Stress Questionnaire    Feeling of Stress : Very much  Social Connections:  Socially Isolated (12/10/2019)   Social Connection and Isolation Panel    Frequency of Communication with Friends and Family: More than three times a week    Frequency of Social Gatherings with Friends and Family: Once a week    Attends Religious Services: Never    Database administrator or Organizations: No    Attends Banker Meetings: Never    Marital Status: Divorced  Catering manager Violence: Not At Risk (12/10/2019)   Humiliation, Afraid, Rape, and Kick questionnaire    Fear of Current or Ex-Partner: No    Emotionally Abused: No    Physically Abused: No    Sexually Abused: No    Family History: Family History  Problem Relation Age of Onset   Congenital heart disease Mother    Bipolar disorder Mother    Prostate cancer Brother    Alzheimer's disease Paternal Grandmother    Colon cancer Neg Hx     Current Medications:  Current Outpatient Medications:    albuterol  (VENTOLIN  HFA) 108 (90 Base) MCG/ACT inhaler, Inhale 2 puffs into the lungs every 4 (four) hours as needed for wheezing or shortness of breath., Disp: 8 g, Rfl: 3   BREZTRI AEROSPHERE 160-9-4.8 MCG/ACT AERO inhaler, Inhale into the lungs., Disp: , Rfl:    diphenoxylate -atropine  (LOMOTIL ) 2.5-0.025 MG tablet, Take 2 tablets by mouth 4 (four) times daily as needed for diarrhea or loose stools., Disp: 240 tablet, Rfl: 0   divalproex  (DEPAKOTE ) 250 MG DR tablet, Take 250 mg by mouth at bedtime. , Disp: , Rfl:    fluconazole (DIFLUCAN) 150 MG tablet, Take by mouth., Disp: , Rfl:    folic acid  (FOLVITE ) 1 MG tablet, Take 1 tablet (1 mg total) by mouth daily., Disp: 90 tablet, Rfl: 2   HYDROcodone -acetaminophen  (NORCO/VICODIN) 5-325 MG tablet, Take 1 tablet by mouth 2 (two) times daily as needed for moderate pain (pain score 4-6)., Disp: 60 tablet, Rfl: 0   ipratropium-albuterol  (DUONEB) 0.5-2.5 (3) MG/3ML SOLN, USE 1 VIAL VIA NEBULIZER EVERY 6 HOURS AS NEEDED, Disp: 360 mL, Rfl: 3   lidocaine -prilocaine  (EMLA )  cream, Apply a small amount to port a cath site and cover with plastic wrap 1 hour prior to chemotherapy appointments, Disp: 30 g, Rfl: 3   loperamide  (IMODIUM ) 2 MG capsule, Take 1 capsule (2 mg total) by mouth as needed for diarrhea or loose stools (Take 2 capsiles after the first loose stool and then 1 capsule after each loos stool. Do nt exceed 8 capsules in a 24hour period. If it is bedtime and you are having loose stools, take 2 capsules at bedtime and then take 2 capsules every 4 hours until morning.)., Disp: 60 capsule, Rfl: 1   magnesium  oxide (MAG-OX) 400 (240 Mg) MG tablet, Take 2 tablets (800 mg total) by mouth 2 (two) times daily., Disp: 120 tablet, Rfl: 4   nitrofurantoin , macrocrystal-monohydrate, (MACROBID ) 100 MG capsule, Take 100 mg by mouth every 12 (twelve) hours., Disp: , Rfl:    ondansetron  (ZOFRAN ) 4 MG tablet, Take 1 tablet (4 mg total) by mouth every 8 (eight) hours as needed for nausea or vomiting., Disp: 20 tablet, Rfl: 0   pantoprazole  (PROTONIX ) 40 MG tablet, TAKE 1 TABLET BY MOUTH DAILY BEFORE BREAKFAST, Disp: 90 tablet, Rfl: 1   predniSONE  (DELTASONE ) 20 MG tablet, TAKE ONE TABLET BY MOUTH DAILY WITH BREAKFAST, Disp: 30 tablet, Rfl: 1   prochlorperazine  (COMPAZINE ) 10 MG tablet, Take 1 tablet (10 mg total) by mouth every 6 (six) hours as needed for nausea or vomiting., Disp: 60 tablet, Rfl: 5   risperiDONE  (RISPERDAL ) 2 MG tablet, Take 2 mg by mouth at bedtime. , Disp: , Rfl:    SYMBICORT  160-4.5 MCG/ACT inhaler, Inhale 2 puffs into the lungs 2 (two) times daily., Disp: 1 each, Rfl: 6   traMADol  (ULTRAM ) 50 MG tablet, Take 1 tablet (50 mg total) by mouth 2 (two) times daily as needed., Disp: 60 tablet, Rfl: 0   traZODone (DESYREL) 100 MG tablet, Take by mouth., Disp: , Rfl:  No current facility-administered medications for this visit.  Facility-Administered Medications Ordered in Other Visits:    heparin  lock flush 100 unit/mL, 500 Units, Intracatheter, Once PRN,  Fynlee Rowlands, MD   magnesium  sulfate IVPB 2 g 50 mL, 2 g, Intravenous, Once, Rogers Hai, MD, Last Rate: 50 mL/hr at 08/23/23 1406, 2 g at 08/23/23 1406   PEMEtrexed  Disodium (ALIMTA ) 700 mg in sodium chloride  0.9 % 100 mL chemo infusion, 400 mg/m2 (Treatment Plan Recorded), Intravenous, Once, Rogers Hai, MD   Allergies: Allergies  Allergen Reactions   Folic Acid  Anaphylaxis    Not entirely clear if it is from folic acid . She is taking folic acid  now and doesn't have the throat swelling anymore   Penicillins Anaphylaxis    Throat swelled Did it involve swelling of the face/tongue/throat, SOB, or low BP? Yes Did it involve sudden or severe rash/hives, skin peeling, or any reaction on the inside of your mouth or nose? No Did you need to seek medical attention at a hospital or doctor's office? No When did it last happen?      childhood allergy If all above answers are "NO", may proceed with cephalosporin use.    Amoxicillin     hallucinations Did it involve swelling of the face/tongue/throat, SOB, or low BP? No Did it involve sudden or severe rash/hives, skin peeling, or any reaction on the inside of your mouth or nose? No Did you need to seek medical attention at a hospital or doctor's office? Yes When did it last happen?      July 2019  If all above answers are "NO", may proceed with cephalosporin use.    Carboplatin  Nausea Only, Other (See Comments) and Cough    Patient complaints of feeling hot and flushed. See progress note from 7/11. Patient able to complete infusion after additional medications given.  Fingers itch, nausea Second reaction on 08/30/2022; included itching of hands and nausea; see progress note from 08/30/2022   Fish Allergy Nausea Only   Other Swelling    Patient states that the sunrise brand folic acid  made her feel like her throat was swelling.     REVIEW OF SYSTEMS:   Review of Systems  Constitutional:  Negative for chills, fatigue  and fever.  HENT:   Positive for trouble swallowing. Negative for lump/mass, mouth sores, nosebleeds and sore throat.   Eyes:  Negative for eye problems.  Respiratory:  Positive for cough and shortness of breath.   Cardiovascular:  Positive for chest pain (occasional). Negative for leg swelling and palpitations.  Gastrointestinal:  Positive for diarrhea. Negative for abdominal pain, constipation, nausea and vomiting.  Genitourinary:  Positive for frequency. Negative for bladder incontinence, difficulty urinating, dysuria, hematuria and nocturia.   Musculoskeletal:  Positive for back pain (7/10 severity). Negative for arthralgias, flank pain, myalgias and neck pain.       +bilateral leg and foot pain, 7/10 severity  Skin:  Negative for itching and rash.  Neurological:  Positive for dizziness and headaches. Negative for numbness.       +tingling in hands and feet  Hematological:  Does not bruise/bleed easily.  Psychiatric/Behavioral:  Positive for depression. Negative for sleep disturbance and suicidal ideas. The patient is nervous/anxious.   All other systems reviewed and are negative.    VITALS:   There were no vitals taken for this visit.  Wt Readings from Last 3 Encounters:  08/23/23 140 lb 1.6 oz (63.5 kg)  08/02/23 140 lb (63.5 kg)  07/12/23 147 lb 7.8 oz (66.9 kg)    There is no height or weight on file to calculate BMI.  Performance status (ECOG): 1 - Symptomatic but completely ambulatory  PHYSICAL EXAM:   Physical Exam Vitals and nursing note reviewed. Exam conducted with a chaperone present.  Constitutional:      Appearance: Normal appearance.  Cardiovascular:     Rate and Rhythm: Normal rate and regular rhythm.     Pulses: Normal pulses.     Heart sounds: Normal heart sounds.  Pulmonary:     Effort: Pulmonary effort is normal.     Breath sounds: Normal breath sounds.  Abdominal:     Palpations: Abdomen is soft. There is no hepatomegaly, splenomegaly or mass.      Tenderness: There is no abdominal tenderness.  Musculoskeletal:     Right lower leg: No edema.     Left lower leg: No edema.  Lymphadenopathy:     Cervical: No cervical adenopathy.     Right cervical: No superficial, deep or posterior cervical adenopathy.    Left cervical: No superficial, deep or posterior cervical adenopathy.     Upper Body:     Right upper body: No supraclavicular or axillary adenopathy.     Left upper body: No supraclavicular or axillary adenopathy.  Neurological:     General: No focal deficit present.     Mental Status: She is alert and oriented to person, place, and time.  Psychiatric:        Mood and Affect: Mood normal.        Behavior: Behavior normal.  LABS:   CBC     Component Value Date/Time   WBC 8.8 08/23/2023 1200   RBC 3.28 (L) 08/23/2023 1200   HGB 11.4 (L) 08/23/2023 1200   HCT 34.6 (L) 08/23/2023 1200   PLT 260 08/23/2023 1200   MCV 105.5 (H) 08/23/2023 1200   MCH 34.8 (H) 08/23/2023 1200   MCHC 32.9 08/23/2023 1200   RDW 15.2 08/23/2023 1200   LYMPHSABS 0.7 08/23/2023 1200   MONOABS 0.2 08/23/2023 1200   EOSABS 0.0 08/23/2023 1200   BASOSABS 0.0 08/23/2023 1200    CMP      Component Value Date/Time   NA 139 08/23/2023 1200   K 3.2 (L) 08/23/2023 1200   CL 100 08/23/2023 1200   CO2 25 08/23/2023 1200   GLUCOSE 148 (H) 08/23/2023 1200   BUN <5 (L) 08/23/2023 1200   CREATININE 0.53 08/23/2023 1200   CREATININE 0.45 (L) 09/10/2017 1422   CALCIUM 8.9 08/23/2023 1200   PROT 6.7 08/23/2023 1200   ALBUMIN 3.1 (L) 08/23/2023 1200   AST 23 08/23/2023 1200   ALT 11 08/23/2023 1200   ALKPHOS 53 08/23/2023 1200   BILITOT 0.4 08/23/2023 1200   GFRNONAA >60 08/23/2023 1200   GFRAA >60 10/21/2019 0942     No results found for: CEA1, CEA / No results found for: CEA1, CEA No results found for: PSA1 No results found for: CAN199 No results found for: CAN125  No results found for: STEPHANY RINGS,  A1GS, A2GS, BETS, BETA2SER, GAMS, MSPIKE, SPEI Lab Results  Component Value Date   TIBC 362 01/17/2018   FERRITIN 136 01/17/2018   IRONPCTSAT 40 (H) 01/17/2018   Lab Results  Component Value Date   LDH 146 05/01/2019   LDH 168 11/28/2017   LDH 185 08/24/2017     STUDIES:   CT CHEST ABDOMEN PELVIS W CONTRAST Result Date: 08/17/2023 CLINICAL DATA:  Non-small cell cancer of the right lung restaging * Tracking Code: BO * EXAM: CT CHEST, ABDOMEN, AND PELVIS WITH CONTRAST TECHNIQUE: Multidetector CT imaging of the chest, abdomen and pelvis was performed following the standard protocol during bolus administration of intravenous contrast. RADIATION DOSE REDUCTION: This exam was performed according to the departmental dose-optimization program which includes automated exposure control, adjustment of the mA and/or kV according to patient size and/or use of iterative reconstruction technique. CONTRAST:  OMNIPAQUE  IOHEXOL  300 MG/ML  SOLN COMPARISON:  05/23/2023 FINDINGS: CT CHEST FINDINGS Cardiovascular: Coronary, aortic arch, and branch vessel atherosclerotic vascular disease. Mild to moderate cardiomegaly. Mediastinum/Nodes: Right lower paratracheal node 0.9 cm in short axis on image 23 series 2, stable. No overtly pathologic adenopathy observed in the chest. Lungs/Pleura: Small left and trace right pleural effusion. Biapical pleural-parenchymal spur bulk scarring. Emphysema noted. Right lower lobectomy. Left paramediastinal fibrosis compatible with prior therapy, no changing contour. Stable confluent interstitial in bandlike opacity anteriorly and laterally in the left lower lobe and in the lingula. Stable pleural thickening and subpleural nodularity adjacent to the deformity of the left fifth rib. Mild nodularity in the right lower lobe including a 0.3 by 0.5 cm nodule on image 89 series 3, previously 3 by 4 mm. Stable 5 mm left lower lobe nodule, image 68 series 3. Other mild nodularity  in the lungs appear stable. Musculoskeletal: Expansile mixed density lesion of the left fifth rib appears stable. CT ABDOMEN PELVIS FINDINGS Hepatobiliary: Cirrhotic hepatic morphology with marginal nodularity and prominence of the lateral segment. Contracted gallbladder. No hepatic parenchymal mass appreciated. Pancreas: Unremarkable Spleen: Unremarkable  Adrenals/Urinary Tract: 2 mm left kidney lower pole nonobstructive renal calculus. Stomach/Bowel: 3.2 cm in length fat density lipoma or fatty polyp in the transverse duodenum, image 74 series 2. Widespread colonic diverticulosis although most concentrated in the descending and sigmoid colon. Vascular/Lymphatic: Atherosclerosis is present, including aortoiliac atherosclerotic disease. Left renal artery stent. Reproductive: Small calcified posterior uterine fibroid. Other: Small focus of rim calcified fat necrosis just above the uterus, considered benign. Musculoskeletal: Unremarkable IMPRESSION: 1. Stable appearance of the chest, abdomen, and pelvis. 2. Stable expansile mixed density lesion of the left fifth rib. 3. Stable pleural thickening and subpleural nodularity adjacent to the deformity of the left fifth rib. 4. Stable confluent interstitial and bandlike opacity anteriorly and laterally in the left lower lobe and in the lingula. 5. Stable mild nodularity in the lungs. 6. Small left and trace right pleural effusion. 7. Cirrhotic hepatic morphology. 8. 2 mm left kidney lower pole nonobstructive renal calculus. 9. Widespread colonic diverticulosis. 10. 3.2 cm in length fat density lipoma or fatty polyp in the transverse duodenum. 11. Mild to moderate cardiomegaly. 12. Aortic Atherosclerosis (ICD10-I70.0) and Emphysema (ICD10-J43.9). Electronically Signed   By: Ryan Salvage M.D.   On: 08/17/2023 14:04

## 2023-09-04 ENCOUNTER — Other Ambulatory Visit: Payer: Self-pay

## 2023-09-07 ENCOUNTER — Other Ambulatory Visit: Payer: Self-pay | Admitting: Internal Medicine

## 2023-09-10 ENCOUNTER — Inpatient Hospital Stay: Admitting: Oncology

## 2023-09-10 ENCOUNTER — Inpatient Hospital Stay

## 2023-09-11 ENCOUNTER — Other Ambulatory Visit: Payer: Self-pay

## 2023-09-13 ENCOUNTER — Inpatient Hospital Stay: Attending: Hematology

## 2023-09-13 ENCOUNTER — Inpatient Hospital Stay

## 2023-09-13 DIAGNOSIS — Z5111 Encounter for antineoplastic chemotherapy: Secondary | ICD-10-CM | POA: Diagnosis present

## 2023-09-13 DIAGNOSIS — C3431 Malignant neoplasm of lower lobe, right bronchus or lung: Secondary | ICD-10-CM | POA: Insufficient documentation

## 2023-09-13 DIAGNOSIS — C3491 Malignant neoplasm of unspecified part of right bronchus or lung: Secondary | ICD-10-CM

## 2023-09-13 LAB — CBC WITH DIFFERENTIAL/PLATELET
Abs Immature Granulocytes: 0.02 K/uL (ref 0.00–0.07)
Basophils Absolute: 0 K/uL (ref 0.0–0.1)
Basophils Relative: 0 %
Eosinophils Absolute: 0.1 K/uL (ref 0.0–0.5)
Eosinophils Relative: 1 %
HCT: 31.4 % — ABNORMAL LOW (ref 36.0–46.0)
Hemoglobin: 10.4 g/dL — ABNORMAL LOW (ref 12.0–15.0)
Immature Granulocytes: 0 %
Lymphocytes Relative: 19 %
Lymphs Abs: 1.3 K/uL (ref 0.7–4.0)
MCH: 34.6 pg — ABNORMAL HIGH (ref 26.0–34.0)
MCHC: 33.1 g/dL (ref 30.0–36.0)
MCV: 104.3 fL — ABNORMAL HIGH (ref 80.0–100.0)
Monocytes Absolute: 0.8 K/uL (ref 0.1–1.0)
Monocytes Relative: 11 %
Neutro Abs: 4.6 K/uL (ref 1.7–7.7)
Neutrophils Relative %: 69 %
Platelets: 319 K/uL (ref 150–400)
RBC: 3.01 MIL/uL — ABNORMAL LOW (ref 3.87–5.11)
RDW: 16.6 % — ABNORMAL HIGH (ref 11.5–15.5)
WBC: 6.7 K/uL (ref 4.0–10.5)
nRBC: 0 % (ref 0.0–0.2)

## 2023-09-13 LAB — COMPREHENSIVE METABOLIC PANEL WITH GFR
ALT: 8 U/L (ref 0–44)
AST: 23 U/L (ref 15–41)
Albumin: 2.9 g/dL — ABNORMAL LOW (ref 3.5–5.0)
Alkaline Phosphatase: 52 U/L (ref 38–126)
Anion gap: 11 (ref 5–15)
BUN: <5 mg/dL — ABNORMAL LOW (ref 6–20)
CO2: 27 mmol/L (ref 22–32)
Calcium: 8.5 mg/dL — ABNORMAL LOW (ref 8.9–10.3)
Chloride: 95 mmol/L — ABNORMAL LOW (ref 98–111)
Creatinine, Ser: 0.5 mg/dL (ref 0.44–1.00)
GFR, Estimated: >60 mL/min (ref >60)
Glucose, Bld: 131 mg/dL — ABNORMAL HIGH (ref 70–99)
Potassium: 3.1 mmol/L — ABNORMAL LOW (ref 3.5–5.1)
Sodium: 133 mmol/L — ABNORMAL LOW (ref 135–145)
Total Bilirubin: 0.2 mg/dL (ref 0.0–1.2)
Total Protein: 6.3 g/dL — ABNORMAL LOW (ref 6.5–8.1)

## 2023-09-13 LAB — TSH: TSH: 1.336 u[IU]/mL (ref 0.350–4.500)

## 2023-09-13 LAB — MAGNESIUM: Magnesium: 1.3 mg/dL — ABNORMAL LOW (ref 1.7–2.4)

## 2023-09-13 MED ORDER — POTASSIUM CHLORIDE CRYS ER 20 MEQ PO TBCR
40.0000 meq | EXTENDED_RELEASE_TABLET | Freq: Once | ORAL | Status: AC
  Administered 2023-09-13: 40 meq via ORAL
  Filled 2023-09-13: qty 2

## 2023-09-13 MED ORDER — SODIUM CHLORIDE 0.9 % IV SOLN
400.0000 mg/m2 | Freq: Once | INTRAVENOUS | Status: AC
  Administered 2023-09-13: 700 mg via INTRAVENOUS
  Filled 2023-09-13: qty 20

## 2023-09-13 MED ORDER — MAGNESIUM SULFATE 4 GM/100ML IV SOLN
4.0000 g | Freq: Once | INTRAVENOUS | Status: AC
  Administered 2023-09-13: 4 g via INTRAVENOUS
  Filled 2023-09-13: qty 100

## 2023-09-13 MED ORDER — SODIUM CHLORIDE 0.9 % IV SOLN: Freq: Once | INTRAVENOUS | Status: AC

## 2023-09-13 MED ORDER — CYANOCOBALAMIN 1000 MCG/ML IJ SOLN: 1000.0000 ug | Freq: Once | INTRAMUSCULAR | Status: DC

## 2023-09-13 MED ORDER — PROCHLORPERAZINE MALEATE 10 MG PO TABS
10.0000 mg | ORAL_TABLET | Freq: Once | ORAL | Status: AC
  Administered 2023-09-13: 10 mg via ORAL
  Filled 2023-09-13: qty 1

## 2023-09-13 NOTE — Patient Instructions (Signed)
 [facility] - A DEPT OF [facility]  Discharge Instructions: Thank you for choosing [facility] to provide your oncology and hematology care.  If you have a lab appointment with the Cancer Center - please note that after April 8th, 2024, all labs will be drawn in the cancer center.  You do not have to check in or register with the main entrance as you have in the past but will complete your check-in in the cancer center.  Wear comfortable clothing and clothing appropriate for easy access to any Portacath or PICC line.   We strive to give you quality time with your provider. You may need to reschedule your appointment if you arrive late (15 or more minutes).  Arriving late affects you and other patients whose appointments are after yours.  Also, if you miss three or more appointments without notifying the office, you may be dismissed from the clinic at the provider's discretion.      For prescription refill requests, have your pharmacy contact our office and allow 72 hours for refills to be completed.    Today you received the following chemotherapy and/or immunotherapy agents alimta    To help prevent nausea and vomiting after your treatment, we encourage you to take your nausea medication as directed.  BELOW ARE SYMPTOMS THAT SHOULD BE REPORTED IMMEDIATELY: *FEVER GREATER THAN 100.4 F (38 C) OR HIGHER *CHILLS OR SWEATING *NAUSEA AND VOMITING THAT IS NOT CONTROLLED WITH YOUR NAUSEA MEDICATION *UNUSUAL SHORTNESS OF BREATH *UNUSUAL BRUISING OR BLEEDING *URINARY PROBLEMS (pain or burning when urinating, or frequent urination) *BOWEL PROBLEMS (unusual diarrhea, constipation, pain near the anus) TENDERNESS IN MOUTH AND THROAT WITH OR WITHOUT PRESENCE OF ULCERS (sore throat, sores in mouth, or a toothache) UNUSUAL RASH, SWELLING OR PAIN  UNUSUAL VAGINAL DISCHARGE OR ITCHING   Items with * indicate a potential emergency and should be followed up as  soon as possible or go to the Emergency Department if any problems should occur.  Please show the CHEMOTHERAPY ALERT CARD or IMMUNOTHERAPY ALERT CARD at check-in to the Emergency Department and triage nurse.  Should you have questions after your visit or need to cancel or reschedule your appointment, please contact [facility] [place] - A DEPT OF [name] [phone]  and follow the prompts.  Office hours are 8:00 a.m. to 4:30 p.m. Monday - Friday. Please note that voicemails left after 4:00 p.m. may not be returned until the following business day.  We are closed weekends and major holidays. You have access to a nurse at all times for urgent questions. Please call the main number to the clinic [phone] and follow the prompts.  For any non-urgent questions, you may also contact your provider using MyChart. We now offer e-Visits for anyone 43 and older to request care online for non-urgent symptoms. For details visit [email or web address].   Also download the MyChart app! Go to the app store, search MyChart, open the app, select [place], and log in with your MyChart username and password.

## 2023-09-13 NOTE — Progress Notes (Signed)

## 2023-09-13 NOTE — Progress Notes (Signed)
 Patients port flushed without difficulty.  Good blood return noted with no bruising or swelling noted at site. Patient remains accessed for treatment.

## 2023-09-18 LAB — T4: T4, Total: 9.4 ug/dL (ref 4.5–12.0)

## 2023-10-03 NOTE — Progress Notes (Incomplete)
 Patient Care Team: Shona Norleen PEDLAR, MD as PCP - General (Internal Medicine) Shaaron Lamar HERO, MD as Consulting Physician (Gastroenterology)  Clinic Day:  10/03/2023  Referring physician: Shona Norleen PEDLAR, MD   CHIEF COMPLAINT:  CC: ***  Rachel Gross 58 y.o. female was transferred to my care after her prior physician has left.   ASSESSMENT & PLAN:   Assessment & Plan: Rachel Gross  is a 58 y.o. female with ***  Assessment & Plan     The patient understands the plans discussed today and is in agreement with them.  She knows to contact our office if she develops concerns prior to her next appointment.  *** minutes of total time was spent for this patient encounter, including preparation,review of records,  face-to-face counseling with the patient and coordination of care, physical exam, and documentation of the encounter.   Verneta JONELLE Ege  [facility] [place] - A DEPT OF [name] [address] [place] [address] Dept: [phone] Dept Fax: [phone]   No orders of the defined types were placed in this encounter.    ONCOLOGY HISTORY:   I have reviewed her chart and materials related to her cancer extensively and collaborated history with the patient. Summary of oncologic history is as follows:   ***  Current Treatment:  ***  INTERVAL HISTORY:   Rachel Gross is here today for follow up. Patient is accompanied by *** .     I have reviewed the past medical history, past surgical history, social history and family history with the patient and they are unchanged from previous note.  ALLERGIES:  is allergic to folic acid , penicillins, amoxicillin, carboplatin , fish allergy, and other.  MEDICATIONS:  Current Outpatient Medications  Medication Sig Dispense Refill   albuterol  (VENTOLIN  HFA) 108 (90 Base) MCG/ACT inhaler Inhale 2 puffs into the lungs every 4 (four) hours as needed for wheezing or shortness  of breath. 8 g 3   BREZTRI AEROSPHERE 160-9-4.8 MCG/ACT AERO inhaler Inhale into the lungs.     diphenoxylate -atropine  (LOMOTIL ) 2.5-0.025 MG tablet Take 2 tablets by mouth 4 (four) times daily as needed for diarrhea or loose stools. 240 tablet 0   divalproex  (DEPAKOTE ) 250 MG DR tablet Take 250 mg by mouth at bedtime.      fluconazole (DIFLUCAN) 150 MG tablet Take by mouth.     folic acid  (FOLVITE ) 1 MG tablet Take 1 tablet (1 mg total) by mouth daily. 90 tablet 2   HYDROcodone -acetaminophen  (NORCO/VICODIN) 5-325 MG tablet Take 1 tablet by mouth 2 (two) times daily as needed for moderate pain (pain score 4-6). 60 tablet 0   ipratropium-albuterol  (DUONEB) 0.5-2.5 (3) MG/3ML SOLN USE 1 VIAL VIA NEBULIZER EVERY 6 HOURS AS NEEDED 360 mL 3   lidocaine -prilocaine  (EMLA ) cream Apply a small amount to port a cath site and cover with plastic wrap 1 hour prior to chemotherapy appointments 30 g 3   loperamide  (IMODIUM ) 2 MG capsule Take 1 capsule (2 mg total) by mouth as needed for diarrhea or loose stools (Take 2 capsiles after the first loose stool and then 1 capsule after each loos stool. Do nt exceed 8 capsules in a 24hour period. If it is bedtime and you are having loose stools, take 2 capsules at bedtime and then take 2 capsules every 4 hours until morning.). 60 capsule 1   magnesium  oxide (MAG-OX) 400 (240 Mg) MG tablet Take 2 tablets (800 mg total) by  mouth 2 (two) times daily. 120 tablet 4   nitrofurantoin , macrocrystal-monohydrate, (MACROBID ) 100 MG capsule Take 100 mg by mouth every 12 (twelve) hours.     ondansetron  (ZOFRAN ) 4 MG tablet Take 1 tablet (4 mg total) by mouth every 8 (eight) hours as needed for nausea or vomiting. 20 tablet 0   pantoprazole  (PROTONIX ) 40 MG tablet TAKE 1 TABLET BY MOUTH DAILY BEFORE BREAKFAST 90 tablet 1   predniSONE  (DELTASONE ) 20 MG tablet TAKE ONE TABLET BY MOUTH DAILY WITH BREAKFAST 30 tablet 1   prochlorperazine  (COMPAZINE ) 10 MG tablet Take 1 tablet (10 mg total)  by mouth every 6 (six) hours as needed for nausea or vomiting. 60 tablet 5   risperiDONE  (RISPERDAL ) 2 MG tablet Take 2 mg by mouth at bedtime.      SYMBICORT  160-4.5 MCG/ACT inhaler Inhale 2 puffs into the lungs 2 (two) times daily. 1 each 6   traMADol  (ULTRAM ) 50 MG tablet Take 1 tablet (50 mg total) by mouth 2 (two) times daily as needed. 60 tablet 0   traZODone (DESYREL) 100 MG tablet Take by mouth.     No current facility-administered medications for this visit.    REVIEW OF SYSTEMS:   Constitutional: Denies fevers, chills or abnormal weight loss Eyes: Denies blurriness of vision Ears, nose, mouth, throat, and face: Denies mucositis or sore throat Respiratory: Denies cough, dyspnea or wheezes Cardiovascular: Denies palpitation, chest discomfort or lower extremity swelling Gastrointestinal:  Denies nausea, heartburn or change in bowel habits Skin: Denies abnormal skin rashes Lymphatics: Denies new lymphadenopathy or easy bruising Neurological:Denies numbness, tingling or new weaknesses Behavioral/Psych: Mood is stable, no new changes  All other systems were reviewed with the patient and are negative.   VITALS:  There were no vitals taken for this visit.  Wt Readings from Last 3 Encounters:  08/23/23 140 lb 1.6 oz (63.5 kg)  08/02/23 140 lb (63.5 kg)  07/12/23 147 lb 7.8 oz (66.9 kg)    There is no height or weight on file to calculate BMI.  Performance status (ECOG): {CHL ONC [id number]  PHYSICAL EXAM:   GENERAL:alert, no distress and comfortable SKIN: skin color, texture, turgor are normal, no rashes or significant lesions EYES: normal, Conjunctiva are pink and non-injected, sclera clear OROPHARYNX:no exudate, no erythema and lips, buccal mucosa, and tongue normal  NECK: supple, thyroid  normal size, non-tender, without nodularity LYMPH:  no palpable lymphadenopathy in the cervical, axillary or inguinal LUNGS: clear to auscultation and percussion with normal  breathing effort HEART: regular rate & rhythm and no murmurs and no lower extremity edema ABDOMEN:abdomen soft, non-tender and normal bowel sounds Musculoskeletal:no cyanosis of digits and no clubbing  NEURO: alert & oriented x 3 with fluent speech, no focal motor/sensory deficits  LABORATORY DATA:  I have reviewed the data as listed  No results found for: SPEP, UPEP  Lab Results  Component Value Date   WBC 6.7 09/13/2023   NEUTROABS 4.6 09/13/2023   HGB 10.4 (L) 09/13/2023   HCT 31.4 (L) 09/13/2023   MCV 104.3 (H) 09/13/2023   PLT 319 09/13/2023    @LASTCHEM @   RADIOGRAPHIC STUDIES: I have personally reviewed the radiological images as listed and agreed with the findings in the report. CT CHEST ABDOMEN PELVIS W CONTRAST CLINICAL DATA:  Non-small cell cancer of the right lung restaging  * Tracking Code: BO *  EXAM: CT CHEST, ABDOMEN, AND PELVIS WITH CONTRAST  TECHNIQUE: Multidetector CT imaging of the chest, abdomen and pelvis was performed following  the standard protocol during bolus administration of intravenous contrast.  RADIATION DOSE REDUCTION: This exam was performed according to the departmental dose-optimization program which includes automated exposure control, adjustment of the mA and/or kV according to patient size and/or use of iterative reconstruction technique.  CONTRAST:  OMNIPAQUE  IOHEXOL  300 MG/ML  SOLN  COMPARISON:  05/23/2023  FINDINGS: CT CHEST FINDINGS  Cardiovascular: Coronary, aortic arch, and branch vessel atherosclerotic vascular disease. Mild to moderate cardiomegaly.  Mediastinum/Nodes: Right lower paratracheal node 0.9 cm in short axis on image 23 series 2, stable. No overtly pathologic adenopathy observed in the chest.  Lungs/Pleura: Small left and trace right pleural effusion. Biapical pleural-parenchymal spur bulk scarring. Emphysema noted.  Right lower lobectomy. 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 Stable confluent interstitial in bandlike opacity anteriorly and laterally in the left lower lobe and in the lingula.  Stable pleural thickening and subpleural nodularity adjacent to the deformity of the left fifth rib.  Mild nodularity in the right lower lobe including a 0.3 by 0.5 cm nodule on image 89 series 3, previously 3 by 4 mm. Stable 5 mm left lower lobe nodule, image 68 series 3. Other mild nodularity in the lungs appear stable.  Musculoskeletal: Expansile mixed density lesion of the left fifth rib appears stable.  CT ABDOMEN PELVIS FINDINGS  Hepatobiliary: Cirrhotic hepatic morphology with marginal nodularity and prominence of the lateral segment. Contracted gallbladder. No hepatic parenchymal mass appreciated.  Pancreas: Unremarkable  Spleen: Unremarkable  Adrenals/Urinary Tract: 2 mm left kidney lower pole nonobstructive renal calculus.  Stomach/Bowel: 3.2 cm in length fat density lipoma or fatty polyp in the transverse duodenum, image 74 series 2. Widespread colonic diverticulosis although most concentrated in the descending and sigmoid colon.  Vascular/Lymphatic: Atherosclerosis is present, including aortoiliac atherosclerotic disease. Left renal artery stent.  Reproductive: Small calcified posterior uterine fibroid.  Other: Small focus of rim calcified fat necrosis just above the uterus, considered benign.  Musculoskeletal: Unremarkable  IMPRESSION: 1. Stable appearance of the chest, abdomen, and pelvis. 2. Stable expansile mixed density lesion of the left fifth rib. 3. Stable pleural thickening and subpleural nodularity adjacent to the deformity of the left fifth rib. 4. Stable confluent interstitial and bandlike opacity anteriorly and laterally in the left lower lobe and in the lingula. 5. Stable mild nodularity in the lungs. 6. Small left and trace right pleural effusion. 7. Cirrhotic hepatic morphology. 8. 2 mm  left kidney lower pole nonobstructive renal calculus. 9. Widespread colonic diverticulosis. 10. 3.2 cm in length fat density lipoma or fatty polyp in the transverse duodenum. 11. Mild to moderate cardiomegaly. 12. Aortic Atherosclerosis (ICD10-I70.0) and Emphysema (ICD10-J43.9).  Electronically Signed   By: Ryan Salvage M.D.   On: 08/17/2023 14:04

## 2023-10-04 ENCOUNTER — Inpatient Hospital Stay

## 2023-10-04 ENCOUNTER — Ambulatory Visit

## 2023-10-04 ENCOUNTER — Other Ambulatory Visit: Payer: Self-pay

## 2023-10-04 ENCOUNTER — Ambulatory Visit: Admitting: Oncology

## 2023-10-08 ENCOUNTER — Inpatient Hospital Stay: Attending: Hematology | Admitting: Dietician

## 2023-10-08 ENCOUNTER — Telehealth: Payer: Self-pay | Admitting: Dietician

## 2023-10-08 NOTE — Telephone Encounter (Signed)
 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 She is currently receiving pemetrexed  q21d (last treatment 8/14).   Spoke with patient via telephone. Reports not doing good at all. States I can't eat! Reports this has been ongoing since starting new treatment. Patient did not come for last scheduled infusion and MD follow-up. She has not noticed improvement in symptoms, other than having less diarrhea. Patient reports tolerating pinto beans and cornbread. Recalls little bit of spaghetti and nothing else yesterday. As RD was offering suggestions patient firmly states she has no food.    Medications: reviewed   Labs: Na 133, K 3.1, glucose 131, BUN <5, albumin 2.9, Mg 1.3  Anthropometrics: Last wt 140 lb 1.6 oz on 7/24   7/3 - 140 lb 6/12 - 147 lb 7.8 oz   NUTRITION DIAGNOSIS: Unintended wt loss - suspect ongoing given above    INTERVENTION:  Offered bag of food from [facility] pantry as well as samples of Ensure - pt states she may come later today to pick this up    MONITORING, EVALUATION, GOAL: wt trends, intake   NEXT VISIT: To be determined - pt has no [facility] appointments scheduled at this time

## 2023-10-10 ENCOUNTER — Inpatient Hospital Stay: Admitting: Licensed Clinical Social Worker

## 2023-10-10 DIAGNOSIS — C3491 Malignant neoplasm of unspecified part of right bronchus or lung: Secondary | ICD-10-CM

## 2023-10-10 NOTE — Progress Notes (Signed)
 [facility] CSW Progress Note  Visual merchandiser met with patient to follow-up on SDOH needs.    Interventions: CSW provided pt w/ a food bag from the [employer].  Pt reports she has moved and is sharing an apartment w/ another woman.  Per pt she was approved for an aide w/ her Medicaid benefits.      Follow Up Plan:  Patient will contact CSW with any support or resource needs    Rachel JONELLE Manna, LCSW Clinical Social Worker [facility]    Patient is participating in a Managed Medicaid Plan:  Yes

## 2023-10-11 ENCOUNTER — Other Ambulatory Visit: Payer: Self-pay

## 2023-10-25 ENCOUNTER — Ambulatory Visit

## 2023-10-25 ENCOUNTER — Inpatient Hospital Stay

## 2023-10-28 NOTE — Progress Notes (Incomplete)
 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 female was transferred to my care after her prior physician has left.   ASSESSMENT & PLAN:   Assessment & Plan: Rachel Gross  is a 58 y.o. female with ***  Assessment & Plan     The patient understands the plans discussed today and is in agreement with them.  She knows to contact our office if she develops concerns prior to her next appointment.  *** minutes of total time was spent for this patient encounter, including preparation,review of records,  face-to-face counseling with the patient and coordination of care, physical exam, and documentation of the encounter.    LILLETTE Rachel Gross Teague,acting as a Neurosurgeon for Mickiel Dry, MD.,have documented all relevant documentation on the behalf of Mickiel Dry, MD,as directed by  Mickiel Dry, MD while in the presence of Mickiel Dry, MD.  ***  Rachel Gross Ege  [facility] [place] - A DEPT OF [name] [address] [place] [address] Dept: [phone] Dept Fax: [phone]   No orders of the defined types were placed in this encounter.    ONCOLOGY HISTORY:   I have reviewed her chart and materials related to her cancer extensively and collaborated history with the patient. 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 Imaging features highly suspicious for primary or metastatic neoplasm. 7 mm right middle lobe perifissural nodule has a  triangular configuration and may well be a subpleural lymph node. The 6 mm left lower lobe pulmonary nodule has a more rounded configuration. Cirrhosis.  -05/13/2019: PET: Enlarging hypermetabolic RIGHT lobe nodule is concerning for bronchogenic carcinoma. No hypermetabolic mediastinal lymph nodes. A 6 mm LEFT lobe pulmonary nodule too small to characterize by FDG PET scan. -06/19/2019: Right lower lobectomy and lymph node excision.  -Pathology: Adenocarcinoma, moderately-differentiated, spanning 1.7 cm. Favor non-small cell carcinoma. Tumor is limited to lung. Lymphovascular invasion present. Resection margins are negative. Two of fourteen lymph nodes positive for metastatic carcinoma (2/14). pT1c, pN2  -Foundation One: PD-L1 30%, K-ras G12A, MSI stable.  EGFR mutation not identified.  -09/09/2019-11/11/2019: 4 cycles of adjuvant carboplatin  and pemetrexed  completed -12/29/2021: CT chest: Interval enlargement of a cavitary nodule of the dependent right upper lobe consistent with an enlarging metastasis. 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 Numerous new clustered nodules of varying sizes, generally subsolid in appearance throughout the right upper lobe. Multiple additional bilateral pulmonary nodules are unchanged, possibly benign and incidental underlying sequelae of prior infection or inflammation. -01/19/2022: PET: The cavitary right middle lobe nodule of concern on recent CT is hypermetabolic for size and remains suspicious for bronchogenic carcinoma (metastatic disease versus new primary). 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 No other definite osseous metastases identified. However, there is new prominent nearly symmetric metabolic activity posteromedially within all of the ribs bilaterally which may be stress-related. No evidence of extra thoracic metastatic disease. -  02/06/2022: Left rib  biopsy.  Pathology: Metastatic moderate to poorly differentiated adenocarcinoma.  -*** NGS by [facility]: PD-L1 (22 C3)-TPS 5%. TMB-high. K-ras G12 A pathogenic variant. T p53 pathogenic variant. Other targetable mutations negative. MSI-stable.  -03/22/2022: XRT to left rib lesion completed -06/12/2022-06/16/2022: SBRT to T5 lesion.  -06/29/2022-current: carboplatin , pemetrexed  and pembrolizumab , carboplatin  discontinued after 4 cycles on 08/30/2022, pembrolizumab  discontinued after 04/19/2023 secondary to diarrhea   Current Treatment:  ***  INTERVAL HISTORY:   Rachel Gross is here today for follow up and to establish care with me for metastatic adenocarcinoma of the lung to the left rib. Patient is accompanied by ***.     I have reviewed the past medical history, past surgical history, social history and family history with the patient and they are unchanged from previous note.  ALLERGIES:  is allergic to folic acid , penicillins, amoxicillin, carboplatin , fish allergy, and other.  MEDICATIONS:  Current Outpatient Medications  Medication Sig Dispense Refill   albuterol  (VENTOLIN  HFA) 108 (90 Base) MCG/ACT inhaler Inhale 2 puffs into the lungs every 4 (four) hours as needed for wheezing or shortness of breath. 8 g 3   BREZTRI AEROSPHERE 160-9-4.8 MCG/ACT AERO inhaler Inhale into the lungs.     diphenoxylate -atropine  (LOMOTIL ) 2.5-0.025 MG tablet Take 2 tablets by mouth 4 (four) times daily as needed for diarrhea or loose stools. 240 tablet 0   divalproex  (DEPAKOTE ) 250 MG DR tablet Take 250 mg by mouth at bedtime.      fluconazole (DIFLUCAN) 150 MG tablet Take by mouth.     folic acid  (FOLVITE ) 1 MG tablet Take 1 tablet (1 mg total) by mouth daily. 90 tablet 2   HYDROcodone -acetaminophen  (NORCO/VICODIN) 5-325 MG tablet Take 1 tablet by mouth 2 (two) times daily as needed for moderate pain (pain score 4-6). 60 tablet 0   ipratropium-albuterol  (DUONEB) 0.5-2.5 (3) MG/3ML SOLN USE 1 VIAL VIA  NEBULIZER EVERY 6 HOURS AS NEEDED 360 mL 3   lidocaine -prilocaine  (EMLA ) cream Apply a small amount to port a cath site and cover with plastic wrap 1 hour prior to chemotherapy appointments 30 g 3   loperamide  (IMODIUM ) 2 MG capsule Take 1 capsule (2 mg total) by mouth as needed for diarrhea or loose stools (Take 2 capsiles after the first loose stool and then 1 capsule after each loos stool. Do nt exceed 8 capsules in a 24hour period. If it is bedtime and you are having loose stools, take 2 capsules at bedtime and then take 2 capsules every 4 hours until morning.). 60 capsule 1   magnesium  oxide (MAG-OX) 400 (240 Mg) MG tablet Take 2 tablets (800 mg total) by mouth 2 (two) times daily. 120 tablet 4   nitrofurantoin , macrocrystal-monohydrate, (MACROBID ) 100 MG capsule Take 100 mg by mouth every 12 (twelve) hours.     ondansetron  (ZOFRAN ) 4 MG tablet Take 1 tablet (4 mg total) by mouth every 8 (eight) hours as needed for nausea or vomiting. 20 tablet 0   pantoprazole  (PROTONIX ) 40 MG tablet TAKE 1 TABLET BY MOUTH DAILY BEFORE BREAKFAST 90 tablet 1   predniSONE  (DELTASONE ) 20 MG tablet TAKE ONE TABLET BY MOUTH DAILY WITH BREAKFAST 30 tablet 1   prochlorperazine  (COMPAZINE ) 10 MG tablet Take 1 tablet (10 mg total) by mouth every 6 (six) hours as needed for nausea or vomiting. 60 tablet 5   risperiDONE  (RISPERDAL ) 2 MG tablet Take 2 mg by mouth at bedtime.      SYMBICORT  160-4.5 MCG/ACT inhaler Inhale 2 puffs  into the lungs 2 (two) times daily. 1 each 6   traMADol  (ULTRAM ) 50 MG tablet Take 1 tablet (50 mg total) by mouth 2 (two) times daily as needed. 60 tablet 0   traZODone (DESYREL) 100 MG tablet Take by mouth.     No current facility-administered medications for this visit.    REVIEW OF SYSTEMS:   Constitutional: Denies fevers, chills or abnormal weight loss Eyes: Denies blurriness of vision Ears, nose, mouth, throat, and face: Denies mucositis or sore throat Respiratory: Denies cough, dyspnea  or wheezes Cardiovascular: Denies palpitation, chest discomfort or lower extremity swelling Gastrointestinal:  Denies nausea, heartburn or change in bowel habits Skin: Denies abnormal skin rashes Lymphatics: Denies new lymphadenopathy or easy bruising Neurological:Denies numbness, tingling or new weaknesses Behavioral/Psych: Mood is stable, no new changes  All other systems were reviewed with the patient and are negative.   VITALS:  There were no vitals taken for this visit.  Wt Readings from Last 3 Encounters:  08/23/23 140 lb 1.6 oz (63.5 kg)  08/02/23 140 lb (63.5 kg)  07/12/23 147 lb 7.8 oz (66.9 kg)    There is no height or weight on file to calculate BMI.  Performance status (ECOG): {CHL ONC [id number]  PHYSICAL EXAM:   GENERAL:alert, no distress and comfortable SKIN: skin color, texture, turgor are normal, no rashes or significant lesions EYES: normal, Conjunctiva are pink and non-injected, sclera clear OROPHARYNX:no exudate, no erythema and lips, buccal mucosa, and tongue normal  NECK: supple, thyroid  normal size, non-tender, without nodularity LYMPH:  no palpable lymphadenopathy in the cervical, axillary or inguinal LUNGS: clear to auscultation and percussion with normal breathing effort HEART: regular rate & rhythm and no murmurs and no lower extremity edema ABDOMEN:abdomen soft, non-tender and normal bowel sounds Musculoskeletal:no cyanosis of digits and no clubbing  NEURO: alert & oriented x 3 with fluent speech, no focal motor/sensory deficits  LABORATORY DATA:  I have reviewed the data as listed  No results found for: SPEP, UPEP  Lab Results  Component Value Date   WBC 6.7 09/13/2023   NEUTROABS 4.6 09/13/2023   HGB 10.4 (L) 09/13/2023   HCT 31.4 (L) 09/13/2023   MCV 104.3 (H) 09/13/2023   PLT 319 09/13/2023    @LASTCHEMISTRY @   RADIOGRAPHIC STUDIES: I have personally reviewed the radiological images as listed and agreed with the findings in  the report. CT CHEST ABDOMEN PELVIS W CONTRAST CLINICAL DATA:  Non-small cell cancer of the right lung restaging  * Tracking Code: BO *  EXAM: CT CHEST, ABDOMEN, AND PELVIS WITH CONTRAST  TECHNIQUE: Multidetector CT imaging of the chest, abdomen and pelvis was performed following the standard protocol during bolus administration of intravenous contrast.  RADIATION DOSE REDUCTION: This exam was performed according to the departmental dose-optimization program which includes automated exposure control, adjustment of the mA and/or kV according to patient size and/or use of iterative reconstruction technique.  CONTRAST:  OMNIPAQUE  IOHEXOL  300 MG/ML  SOLN  COMPARISON:  05/23/2023  FINDINGS: CT CHEST FINDINGS  Cardiovascular: Coronary, aortic arch, and branch vessel atherosclerotic vascular disease. Mild to moderate cardiomegaly.  Mediastinum/Nodes: Right lower paratracheal node 0.9 cm in short axis on image 23 series 2, stable. No overtly pathologic adenopathy observed in the chest.  Lungs/Pleura: Small left and trace right pleural effusion. Biapical pleural-parenchymal spur bulk scarring. Emphysema noted.  Right lower lobectomy. Left paramediastinal fibrosis compatible with prior therapy, no changing contour. Stable confluent interstitial in bandlike opacity anteriorly and laterally in the left  lower lobe and in the lingula.  Stable pleural thickening and subpleural nodularity adjacent to the deformity of the left fifth rib.  Mild nodularity in the right lower lobe including a 0.3 by 0.5 cm nodule on image 89 series 3, previously 3 by 4 mm. Stable 5 mm left lower lobe nodule, image 68 series 3. Other mild nodularity in the lungs appear stable.  Musculoskeletal: Expansile mixed density lesion of the left fifth rib appears stable.  CT ABDOMEN PELVIS FINDINGS  Hepatobiliary: Cirrhotic hepatic morphology with marginal nodularity and prominence of the lateral  segment. Contracted gallbladder. No hepatic parenchymal mass appreciated.  Pancreas: Unremarkable  Spleen: Unremarkable  Adrenals/Urinary Tract: 2 mm left kidney lower pole nonobstructive renal calculus.  Stomach/Bowel: 3.2 cm in length fat density lipoma or fatty polyp in the transverse duodenum, image 74 series 2. Widespread colonic diverticulosis although most concentrated in the descending and sigmoid colon.  Vascular/Lymphatic: Atherosclerosis is present, including aortoiliac atherosclerotic disease. Left renal artery stent.  Reproductive: Small calcified posterior uterine fibroid.  Other: Small focus of rim calcified fat necrosis just above the uterus, considered benign.  Musculoskeletal: Unremarkable  IMPRESSION: 1. Stable appearance of the chest, abdomen, and pelvis. 2. Stable expansile mixed density lesion of the left fifth rib. 3. Stable pleural thickening and subpleural nodularity adjacent to the deformity of the left fifth rib. 4. Stable confluent interstitial and bandlike opacity anteriorly and laterally in the left lower lobe and in the lingula. 5. Stable mild nodularity in the lungs. 6. Small left and trace right pleural effusion. 7. Cirrhotic hepatic morphology. 8. 2 mm left kidney lower pole nonobstructive renal calculus. 9. Widespread colonic diverticulosis. 10. 3.2 cm in length fat density lipoma or fatty polyp in the transverse duodenum. 11. Mild to moderate cardiomegaly. 12. Aortic Atherosclerosis (ICD10-I70.0) and Emphysema (ICD10-J43.9).  Electronically Signed   By: Ryan Salvage M.D.   On: 08/17/2023 14:04

## 2023-10-29 ENCOUNTER — Encounter: Admitting: Dietician

## 2023-10-29 ENCOUNTER — Inpatient Hospital Stay: Admitting: Oncology

## 2023-10-29 ENCOUNTER — Inpatient Hospital Stay

## 2023-11-05 ENCOUNTER — Ambulatory Visit: Admitting: Internal Medicine

## 2023-11-06 ENCOUNTER — Encounter: Payer: Self-pay | Admitting: Internal Medicine

## 2023-11-13 ENCOUNTER — Other Ambulatory Visit: Payer: Self-pay

## 2023-11-13 ENCOUNTER — Encounter (HOSPITAL_COMMUNITY): Payer: Self-pay | Admitting: Emergency Medicine

## 2023-11-13 ENCOUNTER — Emergency Department (HOSPITAL_COMMUNITY)

## 2023-11-13 ENCOUNTER — Inpatient Hospital Stay (HOSPITAL_COMMUNITY)
Admission: EM | Admit: 2023-11-13 | Discharge: 2023-11-17 | DRG: 202 | Disposition: A | Discharge status: Home / Self Care | Attending: Hospitalist | Admitting: Hospitalist

## 2023-11-13 DIAGNOSIS — Z888 Allergy status to other drugs, medicaments and biological substances status: Secondary | ICD-10-CM

## 2023-11-13 DIAGNOSIS — Z1152 Encounter for screening for COVID-19: Secondary | ICD-10-CM

## 2023-11-13 DIAGNOSIS — Z820 Family history of epilepsy and other diseases of the nervous system: Secondary | ICD-10-CM

## 2023-11-13 DIAGNOSIS — F17210 Nicotine dependence, cigarettes, uncomplicated: Secondary | ICD-10-CM | POA: Diagnosis present

## 2023-11-13 DIAGNOSIS — K219 Gastro-esophageal reflux disease without esophagitis: Secondary | ICD-10-CM | POA: Diagnosis present

## 2023-11-13 DIAGNOSIS — F1010 Alcohol abuse, uncomplicated: Secondary | ICD-10-CM | POA: Insufficient documentation

## 2023-11-13 DIAGNOSIS — Z720 Tobacco use: Secondary | ICD-10-CM | POA: Diagnosis not present

## 2023-11-13 DIAGNOSIS — Z8619 Personal history of other infectious and parasitic diseases: Secondary | ICD-10-CM

## 2023-11-13 DIAGNOSIS — F319 Bipolar disorder, unspecified: Secondary | ICD-10-CM | POA: Diagnosis present

## 2023-11-13 DIAGNOSIS — J470 Bronchiectasis with acute lower respiratory infection: Secondary | ICD-10-CM | POA: Diagnosis present

## 2023-11-13 DIAGNOSIS — D696 Thrombocytopenia, unspecified: Secondary | ICD-10-CM | POA: Diagnosis present

## 2023-11-13 DIAGNOSIS — J9611 Chronic respiratory failure with hypoxia: Secondary | ICD-10-CM | POA: Diagnosis present

## 2023-11-13 DIAGNOSIS — Z8249 Family history of ischemic heart disease and other diseases of the circulatory system: Secondary | ICD-10-CM

## 2023-11-13 DIAGNOSIS — J439 Emphysema, unspecified: Secondary | ICD-10-CM | POA: Diagnosis present

## 2023-11-13 DIAGNOSIS — J441 Chronic obstructive pulmonary disease with (acute) exacerbation: Secondary | ICD-10-CM | POA: Diagnosis not present

## 2023-11-13 DIAGNOSIS — I10 Essential (primary) hypertension: Secondary | ICD-10-CM | POA: Diagnosis present

## 2023-11-13 DIAGNOSIS — J209 Acute bronchitis, unspecified: Principal | ICD-10-CM | POA: Diagnosis present

## 2023-11-13 DIAGNOSIS — R195 Other fecal abnormalities: Secondary | ICD-10-CM | POA: Diagnosis present

## 2023-11-13 DIAGNOSIS — Z85118 Personal history of other malignant neoplasm of bronchus and lung: Secondary | ICD-10-CM

## 2023-11-13 DIAGNOSIS — Z91013 Allergy to seafood: Secondary | ICD-10-CM

## 2023-11-13 DIAGNOSIS — Z8701 Personal history of pneumonia (recurrent): Secondary | ICD-10-CM

## 2023-11-13 DIAGNOSIS — Z79899 Other long term (current) drug therapy: Secondary | ICD-10-CM

## 2023-11-13 DIAGNOSIS — F10939 Alcohol use, unspecified with withdrawal, unspecified: Secondary | ICD-10-CM | POA: Insufficient documentation

## 2023-11-13 DIAGNOSIS — J440 Chronic obstructive pulmonary disease with acute lower respiratory infection: Secondary | ICD-10-CM | POA: Diagnosis present

## 2023-11-13 DIAGNOSIS — K7460 Unspecified cirrhosis of liver: Secondary | ICD-10-CM | POA: Diagnosis present

## 2023-11-13 DIAGNOSIS — Z880 Allergy status to penicillin: Secondary | ICD-10-CM

## 2023-11-13 DIAGNOSIS — F10139 Alcohol abuse with withdrawal, unspecified: Secondary | ICD-10-CM | POA: Diagnosis present

## 2023-11-13 DIAGNOSIS — F39 Unspecified mood [affective] disorder: Secondary | ICD-10-CM

## 2023-11-13 DIAGNOSIS — Z8042 Family history of malignant neoplasm of prostate: Secondary | ICD-10-CM

## 2023-11-13 DIAGNOSIS — C3491 Malignant neoplasm of unspecified part of right bronchus or lung: Secondary | ICD-10-CM | POA: Diagnosis present

## 2023-11-13 DIAGNOSIS — R000 Tachycardia, unspecified: Secondary | ICD-10-CM | POA: Diagnosis present

## 2023-11-13 DIAGNOSIS — Z818 Family history of other mental and behavioral disorders: Secondary | ICD-10-CM

## 2023-11-13 LAB — RESP PANEL BY RT-PCR (RSV, FLU A&B, COVID)  RVPGX2
Influenza A by PCR: NEGATIVE
Influenza B by PCR: NEGATIVE
Resp Syncytial Virus by PCR: NEGATIVE
SARS Coronavirus 2 by RT PCR: NEGATIVE

## 2023-11-13 LAB — BASIC METABOLIC PANEL WITH GFR
Anion gap: 15 (ref 5–15)
BUN: <5 mg/dL — ABNORMAL LOW (ref 6–20)
CO2: 26 mmol/L (ref 22–32)
Calcium: 9.1 mg/dL (ref 8.9–10.3)
Chloride: 95 mmol/L — ABNORMAL LOW (ref 98–111)
Creatinine, Ser: 0.43 mg/dL — ABNORMAL LOW (ref 0.44–1.00)
GFR, Estimated: >60 mL/min (ref >60)
Glucose, Bld: 97 mg/dL (ref 70–99)
Potassium: 3.5 mmol/L (ref 3.5–5.1)
Sodium: 136 mmol/L (ref 135–145)

## 2023-11-13 LAB — CBC
HCT: 43.7 % (ref 36.0–46.0)
Hemoglobin: 14.7 g/dL (ref 12.0–15.0)
MCH: 36.3 pg — ABNORMAL HIGH (ref 26.0–34.0)
MCHC: 33.6 g/dL (ref 30.0–36.0)
MCV: 107.9 fL — ABNORMAL HIGH (ref 80.0–100.0)
Platelets: 166 K/uL (ref 150–400)
RBC: 4.05 MIL/uL (ref 3.87–5.11)
RDW: 13.1 % (ref 11.5–15.5)
WBC: 13.3 K/uL — ABNORMAL HIGH (ref 4.0–10.5)
nRBC: 0 % (ref 0.0–0.2)

## 2023-11-13 LAB — MAGNESIUM: Magnesium: 1.6 mg/dL — ABNORMAL LOW (ref 1.7–2.4)

## 2023-11-13 LAB — PRO BRAIN NATRIURETIC PEPTIDE: Pro Brain Natriuretic Peptide: 78.1 pg/mL (ref <300.0)

## 2023-11-13 LAB — TROPONIN T, HIGH SENSITIVITY
Troponin T High Sensitivity: <15 ng/L (ref 0–19)
Troponin T High Sensitivity: <15 ng/L (ref 0–19)

## 2023-11-13 LAB — LACTIC ACID, PLASMA
Lactic Acid, Venous: 1.9 mmol/L (ref 0.5–1.9)
Lactic Acid, Venous: 2.1 mmol/L (ref 0.5–1.9)

## 2023-11-13 LAB — VITAMIN B12: Vitamin B-12: 552 pg/mL (ref 180–914)

## 2023-11-13 MED ORDER — LEVOFLOXACIN IN D5W 750 MG/150ML IV SOLN
750.0000 mg | INTRAVENOUS | Status: DC
  Administered 2023-11-14 – 2023-11-16 (×3): 750 mg via INTRAVENOUS
  Filled 2023-11-13 (×3): qty 150

## 2023-11-13 MED ORDER — ACETAMINOPHEN 650 MG RE SUPP: 650.0000 mg | Freq: Four times a day (QID) | RECTAL | Status: DC | PRN

## 2023-11-13 MED ORDER — MAGNESIUM SULFATE 2 GM/50ML IV SOLN
2.0000 g | Freq: Once | INTRAVENOUS | Status: AC
  Administered 2023-11-13: 2 g via INTRAVENOUS
  Filled 2023-11-13: qty 50

## 2023-11-13 MED ORDER — ARFORMOTEROL TARTRATE 15 MCG/2ML IN NEBU
15.0000 ug | INHALATION_SOLUTION | Freq: Two times a day (BID) | RESPIRATORY_TRACT | Status: DC
  Administered 2023-11-13 – 2023-11-17 (×8): 15 ug via RESPIRATORY_TRACT
  Filled 2023-11-13 (×8): qty 2

## 2023-11-13 MED ORDER — IPRATROPIUM-ALBUTEROL 0.5-2.5 (3) MG/3ML IN SOLN: 3.0000 mL | Freq: Four times a day (QID) | RESPIRATORY_TRACT | Status: DC | PRN

## 2023-11-13 MED ORDER — OXYCODONE HCL 5 MG PO TABS
5.0000 mg | ORAL_TABLET | Freq: Four times a day (QID) | ORAL | Status: DC | PRN
  Administered 2023-11-15 – 2023-11-16 (×2): 5 mg via ORAL
  Filled 2023-11-13 (×2): qty 1

## 2023-11-13 MED ORDER — METHYLPREDNISOLONE SODIUM SUCC 40 MG IJ SOLR
40.0000 mg | Freq: Two times a day (BID) | INTRAMUSCULAR | Status: DC
  Administered 2023-11-13 – 2023-11-16 (×6): 40 mg via INTRAVENOUS
  Filled 2023-11-13 (×6): qty 1

## 2023-11-13 MED ORDER — RISPERIDONE 1 MG PO TABS
2.0000 mg | ORAL_TABLET | Freq: Every day | ORAL | Status: DC
  Administered 2023-11-13 – 2023-11-16 (×4): 2 mg via ORAL
  Filled 2023-11-13 (×4): qty 2

## 2023-11-13 MED ORDER — THIAMINE HCL 100 MG/ML IJ SOLN
100.0000 mg | Freq: Every day | INTRAMUSCULAR | Status: DC
  Administered 2023-11-14: 100 mg via INTRAVENOUS
  Filled 2023-11-13: qty 2

## 2023-11-13 MED ORDER — LORAZEPAM 1 MG PO TABS
0.0000 mg | ORAL_TABLET | Freq: Two times a day (BID) | ORAL | Status: DC
  Administered 2023-11-16: 1 mg via ORAL
  Administered 2023-11-16: 2 mg via ORAL
  Filled 2023-11-13: qty 2
  Filled 2023-11-13: qty 1

## 2023-11-13 MED ORDER — LORAZEPAM 1 MG PO TABS
0.0000 mg | ORAL_TABLET | Freq: Four times a day (QID) | ORAL | Status: AC
  Administered 2023-11-14: 2 mg via ORAL
  Filled 2023-11-13: qty 2

## 2023-11-13 MED ORDER — SODIUM CHLORIDE 0.9 % IV SOLN: 250.0000 mL | INTRAVENOUS | Status: AC | PRN

## 2023-11-13 MED ORDER — THIAMINE MONONITRATE 100 MG PO TABS
100.0000 mg | ORAL_TABLET | Freq: Every day | ORAL | Status: DC
  Administered 2023-11-13 – 2023-11-17 (×4): 100 mg via ORAL
  Filled 2023-11-13 (×5): qty 1

## 2023-11-13 MED ORDER — LORAZEPAM 1 MG PO TABS
1.0000 mg | ORAL_TABLET | ORAL | Status: DC | PRN
  Administered 2023-11-15 – 2023-11-17 (×2): 2 mg via ORAL
  Filled 2023-11-13 (×2): qty 2

## 2023-11-13 MED ORDER — SODIUM CHLORIDE 0.9% FLUSH
10.0000 mL | Freq: Two times a day (BID) | INTRAVENOUS | Status: DC
  Administered 2023-11-13 – 2023-11-17 (×7): 10 mL

## 2023-11-13 MED ORDER — SODIUM CHLORIDE 0.9% FLUSH: 10.0000 mL | INTRAVENOUS | Status: DC | PRN

## 2023-11-13 MED ORDER — DM-GUAIFENESIN ER 30-600 MG PO TB12
1.0000 | ORAL_TABLET | Freq: Two times a day (BID) | ORAL | Status: DC
  Administered 2023-11-13 – 2023-11-17 (×8): 1 via ORAL
  Filled 2023-11-13 (×8): qty 1

## 2023-11-13 MED ORDER — METHYLPREDNISOLONE SODIUM SUCC 125 MG IJ SOLR
125.0000 mg | Freq: Once | INTRAMUSCULAR | Status: AC
  Administered 2023-11-13: 125 mg via INTRAVENOUS
  Filled 2023-11-13: qty 2

## 2023-11-13 MED ORDER — BUDESONIDE 0.5 MG/2ML IN SUSP
0.5000 mg | Freq: Two times a day (BID) | RESPIRATORY_TRACT | Status: AC
Start: 2023-11-13 — End: ?
  Administered 2023-11-13 – 2023-11-17 (×8): 0.5 mg via RESPIRATORY_TRACT
  Filled 2023-11-13 (×8): qty 2

## 2023-11-13 MED ORDER — ONDANSETRON HCL 4 MG/2ML IJ SOLN: 4.0000 mg | Freq: Four times a day (QID) | INTRAMUSCULAR | Status: DC | PRN

## 2023-11-13 MED ORDER — ONDANSETRON HCL 4 MG PO TABS: 4.0000 mg | ORAL_TABLET | Freq: Four times a day (QID) | ORAL | Status: DC | PRN

## 2023-11-13 MED ORDER — SODIUM CHLORIDE 0.9% FLUSH: 3.0000 mL | INTRAVENOUS | Status: DC | PRN

## 2023-11-13 MED ORDER — LORAZEPAM 2 MG/ML IJ SOLN
1.0000 mg | INTRAMUSCULAR | Status: DC | PRN
  Administered 2023-11-13 – 2023-11-15 (×7): 2 mg via INTRAVENOUS
  Filled 2023-11-13 (×8): qty 1

## 2023-11-13 MED ORDER — ACETAMINOPHEN 325 MG PO TABS: 650.0000 mg | ORAL_TABLET | Freq: Four times a day (QID) | ORAL | Status: DC | PRN

## 2023-11-13 MED ORDER — CHLORHEXIDINE GLUCONATE CLOTH 2 % EX PADS
6.0000 | MEDICATED_PAD | Freq: Every day | CUTANEOUS | Status: DC
  Administered 2023-11-14 – 2023-11-17 (×4): 6 via TOPICAL

## 2023-11-13 MED ORDER — ALBUTEROL SULFATE (2.5 MG/3ML) 0.083% IN NEBU
5.0000 mg | INHALATION_SOLUTION | Freq: Once | RESPIRATORY_TRACT | Status: AC
  Administered 2023-11-13: 5 mg via RESPIRATORY_TRACT
  Filled 2023-11-13: qty 6

## 2023-11-13 MED ORDER — ENOXAPARIN SODIUM 40 MG/0.4ML IJ SOSY
40.0000 mg | PREFILLED_SYRINGE | INTRAMUSCULAR | Status: DC
  Administered 2023-11-13 – 2023-11-16 (×4): 40 mg via SUBCUTANEOUS
  Filled 2023-11-13 (×4): qty 0.4

## 2023-11-13 MED ORDER — LEVOFLOXACIN IN D5W 750 MG/150ML IV SOLN
750.0000 mg | Freq: Once | INTRAVENOUS | Status: AC
  Administered 2023-11-13: 750 mg via INTRAVENOUS
  Filled 2023-11-13: qty 150

## 2023-11-13 MED ORDER — ALBUTEROL SULFATE (2.5 MG/3ML) 0.083% IN NEBU
5.0000 mg | INHALATION_SOLUTION | Freq: Once | RESPIRATORY_TRACT | Status: AC
  Administered 2023-11-13: 5 mg via RESPIRATORY_TRACT
  Filled 2023-11-13 (×2): qty 6

## 2023-11-13 MED ORDER — DIVALPROEX SODIUM 250 MG PO DR TAB
250.0000 mg | DELAYED_RELEASE_TABLET | Freq: Every day | ORAL | Status: AC
Start: 2023-11-13 — End: ?
  Administered 2023-11-13 – 2023-11-16 (×4): 250 mg via ORAL
  Filled 2023-11-13 (×4): qty 1

## 2023-11-13 MED ORDER — NICOTINE 21 MG/24HR TD PT24
21.0000 mg | MEDICATED_PATCH | Freq: Every day | TRANSDERMAL | Status: DC
  Administered 2023-11-13 – 2023-11-17 (×5): 21 mg via TRANSDERMAL
  Filled 2023-11-13 (×6): qty 1

## 2023-11-13 MED ORDER — SODIUM CHLORIDE 0.9% FLUSH
3.0000 mL | Freq: Two times a day (BID) | INTRAVENOUS | Status: DC
  Administered 2023-11-13 – 2023-11-16 (×7): 3 mL via INTRAVENOUS

## 2023-11-13 MED ORDER — TRAZODONE HCL 50 MG PO TABS
100.0000 mg | ORAL_TABLET | Freq: Every day | ORAL | Status: DC
  Administered 2023-11-13 – 2023-11-16 (×4): 100 mg via ORAL
  Filled 2023-11-13 (×4): qty 2

## 2023-11-13 MED ORDER — PANTOPRAZOLE SODIUM 40 MG PO TBEC
40.0000 mg | DELAYED_RELEASE_TABLET | Freq: Two times a day (BID) | ORAL | Status: DC
  Administered 2023-11-13 – 2023-11-17 (×8): 40 mg via ORAL
  Filled 2023-11-13 (×8): qty 1

## 2023-11-13 NOTE — Plan of Care (Signed)

## 2023-11-13 NOTE — H&P (Signed)
 History and Physical    Patient: Rachel Gross FMW:[id number] DOB: 1965/04/09 DOA: 11/13/2023 DOS: the patient was seen and examined on 11/13/2023 PCP: Shona Norleen PEDLAR, MD   Patient coming from: Home  Chief Complaint:  Chief Complaint  Patient presents with   Shortness of Breath   HPI: Rachel Gross is a 58 y.o. female with medical history significant of chronic respiratory failure with hypoxia (using 4 L nasal Canula supplementation at baseline), lung cancer, COPD, anxiety, depression with anxiety and history of alcohol and tobacco abuse; who presented to the hospital secondary to 4 days of worsening shortness of breath, dyspnea on exertion and increased expiratory wheezing.  Patient reports the use of home bronchodilator management has failed to improve symptoms.  There has not been any fevers, but patient expressed chills, nonproductive coughing spells and denies hemoptysis, melena, hematochezia, abdominal pain, chest pain, nausea/vomiting, sick contacts, focal weaknesses, dysuria or any other complaints.  Workup in the ED demonstrating emphysematous changes/bronchitic changes on x-ray without frank infiltrates; no fever; elevated WBC 13.3, lactic acid 2.1, negative troponin and magnesium  1.6.  Steroids, nebulizer treatments and IV antibiotics has been started.  TR[facility] contacted to place patient in the hospital for further evaluation and management.  Review of Systems: As mentioned in the history of present illness. All other systems reviewed and are negative. 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    Bipolar disorder (HCC)    Cancer (HCC)    right lung   COPD (chronic obstructive pulmonary disease) (HCC)    Depression    Dyspnea    Emphysema lung (HCC)    GERD (gastroesophageal reflux disease)    Hepatitis C    s/p treatment with  Epclusa   History of HPV infection    History of pneumonia    Pneumonia    Smoker    Past Surgical History:  Procedure Laterality Date   BIOPSY  05/02/2018   Procedure: BIOPSY;  Surgeon: Shaaron Lamar HERO, MD;  [facility]: [facility];  Service: Endoscopy;;  gastric   CESAREAN SECTION     CHEST TUBE INSERTION Right 06/26/2019   Procedure: Right CHEST TUBE REPLACEMENT;  Surgeon: Kerrin Elspeth BROCKS, MD;  Location: [facility] OR;  Service: Thoracic;  Laterality: Right;   COLONOSCOPY WITH PROPOFOL  N/A 03/03/2019   Procedure: COLONOSCOPY WITH PROPOFOL ;  Surgeon: Shaaron Lamar HERO, MD;  Location: [facility];  Service: Endoscopy;  Laterality: N/A;  9:15am   ESOPHAGOGASTRODUODENOSCOPY (EGD) WITH PROPOFOL  N/A 05/02/2018   Procedure: ESOPHAGOGASTRODUODENOSCOPY (EGD) WITH PROPOFOL ;  Surgeon: Shaaron Lamar HERO, MD;  Location: [facility];  Service: Endoscopy;  Laterality: N/A;  8:30am   EYE SURGERY Bilateral    cataract   HEMOSTASIS CLIP PLACEMENT  03/03/2019   Procedure: HEMOSTASIS CLIP PLACEMENT;  Surgeon: Shaaron Lamar HERO, MD;  Location: [facility];  Service: Endoscopy;;   INTERCOSTAL NERVE BLOCK Right 06/19/2019   Procedure: Intercostal Nerve Block;  Surgeon: Kerrin Elspeth BROCKS, MD;  Location: [facility] OR;  Service: Thoracic;  Laterality: Right;   IR US  GUIDE VASC ACCESS RIGHT  03/13/2018   LOBECTOMY     LYMPH NODE DISSECTION Right 06/19/2019   Procedure: Lymph Node Dissection;  Surgeon: Kerrin Elspeth BROCKS, MD;  Location: [facility] OR;  Service: Thoracic;  Laterality: Right;   POLYPECTOMY  03/03/2019   Procedure: POLYPECTOMY;  Surgeon:  Shaaron Lamar HERO, MD;  Location: [facility];  Service: Endoscopy;;   PORTACATH PLACEMENT Left 09/08/2019   Procedure: INSERTION PORT-A-CATH LEFT CHEST (attached catheter in left subclavian);  Surgeon: Mavis Anes, MD;  Location: [facility];  Service: General;  Laterality: Left;   TONSILLECTOMY     TOOTH EXTRACTION  01/31/2018   x 7   VIDEO BRONCHOSCOPY WITH INSERTION OF INTERBRONCHIAL  VALVE (IBV) N/A 06/26/2019   Procedure: VIDEO BRONCHOSCOPY WITH INSERTION OF TWO INTERBRONCHIAL VALVE (IBV);  Surgeon: Kerrin Elspeth BROCKS, MD;  Location: [facility] OR;  Service: Thoracic;  Laterality: N/A;   VIDEO BRONCHOSCOPY WITH INSERTION OF INTERBRONCHIAL VALVE (IBV) N/A 08/08/2019   Procedure: VIDEO BRONCHOSCOPY WITH REMOVAL OF INTERBRONCHIAL VALVE (IBV);  Surgeon: Kerrin Elspeth BROCKS, MD;  Location: [facility] OR;  Service: Thoracic;  Laterality: N/A;   Social History:  reports that she has been smoking cigarettes. She started smoking about 42 years ago. She has a 19 pack-year smoking history. She has never used smokeless tobacco. She reports that she does not currently use alcohol. She reports that she does not currently use drugs after having used the following drugs: Heroin.  Allergies  Allergen Reactions   Folic Acid  Anaphylaxis    Not entirely clear if it is from folic acid . She is taking folic acid  now and doesn't have the throat swelling anymore   Penicillins Anaphylaxis    Throat swelled Swelling of the face/tongue/throat, SOB, or low BP Childhood allergy   Amoxicillin Other (See Comments)    Hallucinations  July 2019    Carboplatin  Nausea Only, Other (See Comments) and Cough    Patient complaints of feeling hot and flushed. See progress note from 7/11. Patient able to complete infusion after additional medications given.  Fingers itch, nausea Second reaction on 08/30/2022; included itching of hands and nausea; see progress note from 08/30/2022   Other Swelling    Patient states that the sunrise brand folic acid  made her feel like her throat was swelling.    Fish Allergy Nausea Only    Family History  Problem Relation Age of Onset   Congenital heart disease Mother    Bipolar disorder Mother    Prostate cancer Brother    Alzheimer's disease Paternal Grandmother    Colon cancer Neg Hx     Prior to Admission medications   Medication Sig Start Date End Date Taking? Authorizing  Provider  BREZTRI AEROSPHERE 160-9-4.8 MCG/ACT AERO inhaler Inhale 2 puffs into the lungs 2 (two) times daily. 06/12/23  Yes [provider]  diphenoxylate -atropine  (LOMOTIL ) 2.5-0.025 MG tablet Take 2 tablets by mouth 4 (four) times daily as needed for diarrhea or loose stools. 05/10/23  Yes Rogers Hai, MD  divalproex  (DEPAKOTE ) 250 MG DR tablet Take 250 mg by mouth at bedtime.    Yes [provider]  HYDROcodone -acetaminophen  (NORCO/VICODIN) 5-325 MG tablet Take 1 tablet by mouth 2 (two) times daily as needed for moderate pain (pain score 4-6). 07/12/23  Yes Rogers Hai, MD  pantoprazole  (PROTONIX ) 40 MG tablet TAKE 1 TABLET BY MOUTH DAILY BEFORE BREAKFAST 09/10/23  Yes Rourk, Lamar HERO, MD  risperiDONE  (RISPERDAL ) 2 MG tablet Take 2 mg by mouth at bedtime.    Yes [provider]  traZODone (DESYREL) 100 MG tablet Take 100 mg by mouth at bedtime. 12/28/21  Yes [provider]    Physical Exam: Vitals:   11/13/23 1556 11/13/23 1600 11/13/23 1643 11/13/23 1700  BP: 121/83 123/69 139/79   Pulse: (!) 114 (!) 112 ROLLEN)  114 (!) 110  Resp: 18  (!) 24 20  Temp: 98.3 F (36.8 C)  98.5 F (36.9 C)   TempSrc: Oral  Oral   SpO2: 96% 97% 98%   Weight:   57.6 kg   Height:   5' 1 (1.549 m)    General exam: Alert, awake, oriented x 3; demonstrating difficulty to speak in full sentences and demonstrating good saturation on chronic supplementation. Respiratory system:positive diffuse exp wheezing and rhonchi; tachypnea (especially with exertion present at time of admission), no using accessory muscles. Cardiovascular system:sinus tachycardia after albuterol  nebs, no rubs, no gallops, no JVD. Gastrointestinal system: Abdomen is nondistended, soft and nontender. No organomegaly or masses felt. Normal bowel sounds heard. Central nervous system: No focal neurological deficits. Extremities: No cyanosis or clubbing. Skin: No petechiae. Psychiatry: Mood &  affect appropriate.   Data Reviewed: Basic metabolic panel: Sodium 136, potassium 3.5, chloride 95, bicarb 26, BUN <5 and GFR 0.43 CBC: WBC 13.3, hemoglobin 14.7, platelet count 166K; MCV: 107.9 BNP: 78.1   Assessment and Plan: 1-SOB/COPD exacerbation -most likely triggered by bronchitis/bronchiectasis -continue IV antibiotics, steroids, nebulizer treatment, flutter valve and mucolytic. -follow clinical response   2-chronic resp failure with hypoxia -continue home oxygen supplementation -continue treatment with bronchodilators as mentioned above -follow response  3-GERD -continue PPI BID  4-hx of alcohol/tobacco abuse -cessation counseling provided -CIWA protocol, thiamine and folic acid  ordered -nicoderm ordered  5-mood disorder -continue depakote , risperdal  and trazodone   6-hypomagnesemia -electrolyte repleted -follow trend  7-elevated MCV -will check B12 level -most likely associated with alcohol use   Advance Care Planning:   Code Status: Full Code   Consults: none   Family Communication: no family at bedside   Severity of Illness: The appropriate patient status for this patient is OBSERVATION. Observation status is judged to be reasonable and necessary in order to provide the required intensity of service to ensure the patient's safety. The patient's presenting symptoms, physical exam findings, and initial radiographic and laboratory data in the context of their medical condition is felt to place them at decreased risk for further clinical deterioration. Furthermore, it is anticipated that the patient will be medically stable for discharge from the hospital within 2 midnights of admission.   Author: Eric Nunnery, MD 11/13/2023 7:54 PM  For on call review [email or web address].

## 2023-11-13 NOTE — TOC CM/SW Note (Signed)
 Transition of Care [facility]) - Inpatient Brief Assessment   Patient Details  Name: [name] MRN: [id number] Date of Birth: Feb 20, 1965  Transition of [facility]) CM/SW Contact:    Noreen KATHEE Cleotilde ISRAEL Phone Number: 11/13/2023, 3:29 PM   Clinical Narrative:  Inpatient Care Management (ICM) has reviewed patient and no other ICM needs have been identified at this time. We will continue to monitor patient advancement through interdisciplinary progression rounds. If new patient transition needs arise, please place a ICM consult.  Transition of Care Asessment: Insurance and Status: Insurance coverage has been reviewed Patient has primary care physician: Yes Home environment has been reviewed: Single Family Home Prior level of function:: Independent Prior/Current Home Services: No current home services Social Drivers of Health Review: SDOH reviewed no interventions necessary Readmission risk has been reviewed: Yes Transition of care needs: no transition of care needs at this time

## 2023-11-13 NOTE — ED Provider Notes (Signed)
 [place] EMERGENCY DEPARTMENT AT [facility] Provider Note   CSN: [id number] Arrival date & time: 11/13/23  1148     Patient presents with: Shortness of Breath   Rachel Gross is a 58 y.o. female.   Pt is a 58 yo female with pmhx significant for COPD (4L chronically), GERD, bipolar d/o, depression, anxiety, non small cell lung cancer (metastatic adenocarcinoma to bone; last chemo with Alimta  on 8/14).  Pt has been sob for the past week.  She could not sleep last night due to sob.  She's had a cough.  She's had chills and has been feeling hot, but has not checked her temp.  She is concerned she has pna.  EMS gave her 1 duoneb en route.       Prior to Admission medications   Medication Sig Start Date End Date Taking? Authorizing Provider  BREZTRI AEROSPHERE 160-9-4.8 MCG/ACT AERO inhaler Inhale 2 puffs into the lungs 2 (two) times daily. 06/12/23  Yes [provider]  diphenoxylate -atropine  (LOMOTIL ) 2.5-0.025 MG tablet Take 2 tablets by mouth 4 (four) times daily as needed for diarrhea or loose stools. 05/10/23  Yes Rogers Hai, MD  divalproex  (DEPAKOTE ) 250 MG DR tablet Take 250 mg by mouth at bedtime.    Yes [provider]  HYDROcodone -acetaminophen  (NORCO/VICODIN) 5-325 MG tablet Take 1 tablet by mouth 2 (two) times daily as needed for moderate pain (pain score 4-6). 07/12/23  Yes Rogers Hai, MD  pantoprazole  (PROTONIX ) 40 MG tablet TAKE 1 TABLET BY MOUTH DAILY BEFORE BREAKFAST 09/10/23  Yes Rourk, Lamar HERO, MD  risperiDONE  (RISPERDAL ) 2 MG tablet Take 2 mg by mouth at bedtime.    Yes [provider]  traZODone (DESYREL) 100 MG tablet Take 100 mg by mouth at bedtime. 12/28/21  Yes [provider]    Allergies: Folic acid , Penicillins, Amoxicillin, Carboplatin , Other, and Fish allergy    Review of Systems  Respiratory:  Positive for cough and shortness of breath.   All other systems reviewed and are  negative.   Updated Vital Signs BP 123/87   Pulse (!) 110   Temp 99 F (37.2 C) (Oral)   Ht 5' 1 (1.549 m)   Wt 64 kg   SpO2 98%   BMI 26.66 kg/m   Physical Exam Vitals and nursing note reviewed.  Constitutional:      Appearance: She is well-developed. She is ill-appearing.  HENT:     Head: Normocephalic and atraumatic.     Mouth/Throat:     Mouth: Mucous membranes are dry.  Eyes:     Extraocular Movements: Extraocular movements intact.     Pupils: Pupils are equal, round, and reactive to light.  Cardiovascular:     Rate and Rhythm: Regular rhythm. Tachycardia present.  Pulmonary:     Effort: Tachypnea present.     Breath sounds: Wheezing and rhonchi present.  Abdominal:     General: Bowel sounds are normal.     Palpations: Abdomen is soft.  Musculoskeletal:        General: Normal range of motion.     Cervical back: Normal range of motion and neck supple.  Skin:    General: Skin is warm.     Capillary Refill: Capillary refill takes less than 2 seconds.  Neurological:     General: No focal deficit present.     Mental Status: She is alert and oriented to person, place, and time.  Psychiatric:        Mood and  Affect: Mood normal.        Behavior: Behavior normal.     (all labs ordered are listed, but only abnormal results are displayed) Labs Reviewed  BASIC METABOLIC PANEL WITH GFR - Abnormal; Notable for the following components:      Result Value   Chloride 95 (*)    BUN <5 (*)    Creatinine, Ser 0.43 (*)    All other components within normal limits  CBC - Abnormal; Notable for the following components:   WBC 13.3 (*)    MCV 107.9 (*)    MCH 36.3 (*)    All other components within normal limits  MAGNESIUM  - Abnormal; Notable for the following components:   Magnesium  1.6 (*)    All other components within normal limits  RESP PANEL BY RT-PCR (RSV, FLU A&B, COVID)  RVPGX2  CULTURE, BLOOD (ROUTINE X 2)  CULTURE, BLOOD (ROUTINE X 2)  PRO BRAIN NATRIURETIC  PEPTIDE  LACTIC ACID, PLASMA  LACTIC ACID, PLASMA  TROPONIN T, HIGH SENSITIVITY  TROPONIN T, HIGH SENSITIVITY    EKG: EKG Interpretation Date/Time:  Tuesday November 13 2023 12:02:29 EDT Ventricular Rate:  106 PR Interval:    QRS Duration:  93 QT Interval:  325 QTC Calculation: 432 R Axis:   23  Text Interpretation: Sinus tachycardia Since last tracing rate faster Confirmed by Dean Clarity [phone]) on 11/13/2023 12:11:24 PM  Radiology: DG Chest Portable 1 View Result Date: 11/13/2023 CLINICAL DATA:  Shortness of breath for several days EXAM: PORTABLE CHEST 1 VIEW COMPARISON:  08/14/2023 CT FINDINGS: Left chest wall port is again noted and stable. The cardiac shadow is within normal limits. Aortic calcifications are seen. Linear scarring is noted in the bases bilaterally. Stable subpleural scarring is noted on the left. No sizable infiltrate or effusion is seen. No bony abnormality is noted. IMPRESSION: Chronic changes without acute abnormality. Electronically Signed   By: Oneil Devonshire M.D.   On: 11/13/2023 12:55     Procedures   Medications Ordered in the ED  magnesium  sulfate IVPB 2 g 50 mL (has no administration in time range)  levofloxacin  (LEVAQUIN ) IVPB 750 mg (has no administration in time range)  methylPREDNISolone  sodium succinate (SOLU-MEDROL ) 125 mg/2 mL injection 125 mg (125 mg Intravenous Given 11/13/23 1222)  albuterol  (PROVENTIL ) (2.5 MG/3ML) 0.083% nebulizer solution 5 mg (5 mg Nebulization Given 11/13/23 1309)  albuterol  (PROVENTIL ) (2.5 MG/3ML) 0.083% nebulizer solution 5 mg (5 mg Nebulization Given 11/13/23 1454)                                    Medical Decision Making Amount and/or Complexity of Data Reviewed Labs: ordered. Radiology: ordered.  Risk Prescription drug management. Decision regarding hospitalization.   This patient presents to the ED for concern of sob, this involves an extensive number of treatment options, and is a complaint that  carries with it a high risk of complications and morbidity.  The differential diagnosis includes covid/flu/rsv, pna, bronchitis, copd exac, PE   Co morbidities that complicate the patient evaluation  COPD (4L chronically), GERD, bipolar d/o, depression, anxiety, non small cell lung cancer (metastatic adenocarcinoma to bone; last chemo with Alimta  on 8/14)   Additional history obtained:  Additional history obtained from epic chart review External records from outside source obtained and reviewed including EMS report   Lab Tests:  I Ordered, and personally interpreted labs.  The pertinent results include:  cbc with wbc elevated at 13.1; bmp nl; mg low at 1.6; lactic nl; covid/flu/rsv neg; trop nl; bnp nl   Imaging Studies ordered:  I ordered imaging studies including cxr  I independently visualized and interpreted imaging which showed Chronic changes without acute abnormality.  I agree with the radiologist interpretation   Cardiac Monitoring:  The patient was maintained on a cardiac monitor.  I personally viewed and interpreted the cardiac monitored which showed an underlying rhythm of: st   Medicines ordered and prescription drug management:  I ordered medication including nebs, solumedrol, levaquin   for sx  Reevaluation of the patient after these medicines showed that the patient improved I have reviewed the patients home medicines and have made adjustments as needed   Test Considered:  ct   Critical Interventions:  abx   Consultations Obtained:  I requested consultation with the hospitalist (Dr. Ricky),  and discussed lab and imaging findings as well as pertinent plan - he will admit   Problem List / ED Course:  Copd exac:  pt is still sob and wheezing and is unable to ambulate or lay back in bed without sob worsening.  Due to chemo with productive cough, I have added levaquin . Hypomag:  iv mg given   Reevaluation:  After the interventions noted above, I  reevaluated the patient and found that they have :improved   Social Determinants of Health:  Lives at home   Dispostion:  After consideration of the diagnostic results and the patients response to treatment, I feel that the patent would benefit from admission.       Final diagnoses:  COPD exacerbation (HCC)  Acute bronchitis, unspecified organism  Hypomagnesemia    ED Discharge Orders     None          Dean Clarity, MD 11/13/23 [phone]

## 2023-11-13 NOTE — ED Triage Notes (Signed)
 Pt bib rcems for sob. Pt has COPD and has felt worse x4 days. Pt wears 4L at base line.

## 2023-11-14 DIAGNOSIS — J9611 Chronic respiratory failure with hypoxia: Secondary | ICD-10-CM | POA: Diagnosis present

## 2023-11-14 DIAGNOSIS — K219 Gastro-esophageal reflux disease without esophagitis: Secondary | ICD-10-CM | POA: Diagnosis present

## 2023-11-14 DIAGNOSIS — Z818 Family history of other mental and behavioral disorders: Secondary | ICD-10-CM | POA: Diagnosis not present

## 2023-11-14 DIAGNOSIS — Z8619 Personal history of other infectious and parasitic diseases: Secondary | ICD-10-CM | POA: Diagnosis not present

## 2023-11-14 DIAGNOSIS — C3491 Malignant neoplasm of unspecified part of right bronchus or lung: Secondary | ICD-10-CM | POA: Diagnosis present

## 2023-11-14 DIAGNOSIS — K7460 Unspecified cirrhosis of liver: Secondary | ICD-10-CM | POA: Diagnosis present

## 2023-11-14 DIAGNOSIS — F1010 Alcohol abuse, uncomplicated: Secondary | ICD-10-CM | POA: Insufficient documentation

## 2023-11-14 DIAGNOSIS — I10 Essential (primary) hypertension: Secondary | ICD-10-CM | POA: Diagnosis present

## 2023-11-14 DIAGNOSIS — Z79899 Other long term (current) drug therapy: Secondary | ICD-10-CM | POA: Diagnosis not present

## 2023-11-14 DIAGNOSIS — Z1152 Encounter for screening for COVID-19: Secondary | ICD-10-CM | POA: Diagnosis not present

## 2023-11-14 DIAGNOSIS — J440 Chronic obstructive pulmonary disease with acute lower respiratory infection: Secondary | ICD-10-CM | POA: Diagnosis present

## 2023-11-14 DIAGNOSIS — J441 Chronic obstructive pulmonary disease with (acute) exacerbation: Secondary | ICD-10-CM | POA: Diagnosis present

## 2023-11-14 DIAGNOSIS — F17210 Nicotine dependence, cigarettes, uncomplicated: Secondary | ICD-10-CM | POA: Diagnosis present

## 2023-11-14 DIAGNOSIS — Z820 Family history of epilepsy and other diseases of the nervous system: Secondary | ICD-10-CM | POA: Diagnosis not present

## 2023-11-14 DIAGNOSIS — F10939 Alcohol use, unspecified with withdrawal, unspecified: Secondary | ICD-10-CM | POA: Insufficient documentation

## 2023-11-14 DIAGNOSIS — D696 Thrombocytopenia, unspecified: Secondary | ICD-10-CM | POA: Diagnosis present

## 2023-11-14 DIAGNOSIS — F319 Bipolar disorder, unspecified: Secondary | ICD-10-CM | POA: Diagnosis present

## 2023-11-14 DIAGNOSIS — Z8701 Personal history of pneumonia (recurrent): Secondary | ICD-10-CM | POA: Diagnosis not present

## 2023-11-14 DIAGNOSIS — Z85118 Personal history of other malignant neoplasm of bronchus and lung: Secondary | ICD-10-CM | POA: Diagnosis not present

## 2023-11-14 DIAGNOSIS — F10139 Alcohol abuse with withdrawal, unspecified: Secondary | ICD-10-CM | POA: Diagnosis present

## 2023-11-14 DIAGNOSIS — J439 Emphysema, unspecified: Secondary | ICD-10-CM | POA: Diagnosis present

## 2023-11-14 DIAGNOSIS — J470 Bronchiectasis with acute lower respiratory infection: Secondary | ICD-10-CM | POA: Diagnosis present

## 2023-11-14 DIAGNOSIS — Z8249 Family history of ischemic heart disease and other diseases of the circulatory system: Secondary | ICD-10-CM | POA: Diagnosis not present

## 2023-11-14 DIAGNOSIS — J209 Acute bronchitis, unspecified: Secondary | ICD-10-CM | POA: Diagnosis present

## 2023-11-14 DIAGNOSIS — Z8042 Family history of malignant neoplasm of prostate: Secondary | ICD-10-CM | POA: Diagnosis not present

## 2023-11-14 LAB — HIV ANTIBODY (ROUTINE TESTING W REFLEX): HIV Screen 4th Generation wRfx: NONREACTIVE

## 2023-11-14 LAB — BASIC METABOLIC PANEL WITH GFR
Anion gap: 9 (ref 5–15)
BUN: 6 mg/dL (ref 6–20)
CO2: 31 mmol/L (ref 22–32)
Calcium: 9.1 mg/dL (ref 8.9–10.3)
Chloride: 96 mmol/L — ABNORMAL LOW (ref 98–111)
Creatinine, Ser: 0.49 mg/dL (ref 0.44–1.00)
GFR, Estimated: >60 mL/min (ref >60)
Glucose, Bld: 150 mg/dL — ABNORMAL HIGH (ref 70–99)
Potassium: 3.9 mmol/L (ref 3.5–5.1)
Sodium: 137 mmol/L (ref 135–145)

## 2023-11-14 LAB — CBC
HCT: 40.8 % (ref 36.0–46.0)
Hemoglobin: 13.5 g/dL (ref 12.0–15.0)
MCH: 34.9 pg — ABNORMAL HIGH (ref 26.0–34.0)
MCHC: 33.1 g/dL (ref 30.0–36.0)
MCV: 105.4 fL — ABNORMAL HIGH (ref 80.0–100.0)
Platelets: 148 K/uL — ABNORMAL LOW (ref 150–400)
RBC: 3.87 MIL/uL (ref 3.87–5.11)
RDW: 12.8 % (ref 11.5–15.5)
WBC: 6.9 K/uL (ref 4.0–10.5)
nRBC: 0 % (ref 0.0–0.2)

## 2023-11-14 LAB — MAGNESIUM: Magnesium: 2.1 mg/dL (ref 1.7–2.4)

## 2023-11-14 NOTE — Progress Notes (Signed)
 Pt is very anxious, restless and demonstrating tremors at bilateral hands and right leg. Pt with history of ETOH with no CIWA orders in place. When questioned regarding how much alcohol she consumes she yelled A LOT!!! When questioned of her last drink she admits this morning and liquor. She typically drinks beer all day per patient. This Clinical research associate contacted hospitalist for orders.

## 2023-11-14 NOTE — Plan of Care (Signed)
  Problem: Education: Goal: Knowledge of General Education information will improve Description: Including pain rating scale, medication(s)/side effects and non-pharmacologic comfort measures 11/14/2023 2214 by Lennie Rodgers BIRCH, RN Outcome: Progressing 11/14/2023 2212 by Lennie Rodgers BIRCH, RN Outcome: Progressing   Problem: Health Behavior/Discharge Planning: Goal: Ability to manage health-related needs will improve 11/14/2023 2214 by Lennie Rodgers BIRCH, RN Outcome: Progressing 11/14/2023 2212 by Lennie Rodgers BIRCH, RN Outcome: Progressing   Problem: Clinical Measurements: Goal: Ability to maintain clinical measurements within normal limits will improve 11/14/2023 2214 by Lennie Rodgers BIRCH, RN Outcome: Progressing 11/14/2023 2212 by Lennie Rodgers BIRCH, RN Outcome: Progressing Goal: Will remain free from infection 11/14/2023 2214 by Lennie Rodgers BIRCH, RN Outcome: Progressing 11/14/2023 2212 by Lennie Rodgers BIRCH, RN Outcome: Progressing Goal: Diagnostic test results will improve 11/14/2023 2214 by Lennie Rodgers BIRCH, RN Outcome: Progressing 11/14/2023 2212 by Lennie Rodgers BIRCH, RN Outcome: Progressing Goal: Respiratory complications will improve 11/14/2023 2214 by Lennie Rodgers BIRCH, RN Outcome: Progressing 11/14/2023 2212 by Lennie Rodgers BIRCH, RN Outcome: Progressing Goal: Cardiovascular complication will be avoided 11/14/2023 2214 by Lennie Rodgers BIRCH, RN Outcome: Progressing 11/14/2023 2212 by Lennie Rodgers BIRCH, RN Outcome: Progressing   Problem: Activity: Goal: Risk for activity intolerance will decrease 11/14/2023 2214 by Lennie Rodgers BIRCH, RN Outcome: Progressing 11/14/2023 2212 by Lennie Rodgers BIRCH, RN Outcome: Progressing   Problem: Nutrition: Goal: Adequate nutrition will be maintained 11/14/2023 2214 by Lennie Rodgers BIRCH, RN Outcome: Progressing 11/14/2023 2212 by Lennie Rodgers BIRCH, RN Outcome: Progressing   Problem: Coping: Goal: Level of anxiety will decrease 11/14/2023  2214 by Lennie Rodgers BIRCH, RN Outcome: Progressing 11/14/2023 2212 by Lennie Rodgers BIRCH, RN Outcome: Progressing   Problem: Elimination: Goal: Will not experience complications related to bowel motility 11/14/2023 2214 by Lennie Rodgers BIRCH, RN Outcome: Progressing 11/14/2023 2212 by Lennie Rodgers BIRCH, RN Outcome: Progressing Goal: Will not experience complications related to urinary retention 11/14/2023 2214 by Lennie Rodgers BIRCH, RN Outcome: Progressing 11/14/2023 2212 by Lennie Rodgers BIRCH, RN Outcome: Progressing   Problem: Pain Managment: Goal: General experience of comfort will improve and/or be controlled 11/14/2023 2214 by Lennie Rodgers BIRCH, RN Outcome: Progressing 11/14/2023 2212 by Lennie Rodgers BIRCH, RN Outcome: Progressing   Problem: Safety: Goal: Ability to remain free from injury will improve 11/14/2023 2214 by Lennie Rodgers BIRCH, RN Outcome: Progressing 11/14/2023 2212 by Lennie Rodgers BIRCH, RN Outcome: Progressing   Problem: Skin Integrity: Goal: Risk for impaired skin integrity will decrease 11/14/2023 2214 by Lennie Rodgers BIRCH, RN Outcome: Progressing 11/14/2023 2212 by Lennie Rodgers BIRCH, RN Outcome: Progressing

## 2023-11-14 NOTE — Plan of Care (Signed)

## 2023-11-14 NOTE — Plan of Care (Signed)
   Problem: Education: Goal: Knowledge of General Education information will improve Description Including pain rating scale, medication(s)/side effects and non-pharmacologic comfort measures Outcome: Progressing   Problem: Health Behavior/Discharge Planning: Goal: Ability to manage health-related needs will improve Outcome: Progressing

## 2023-11-14 NOTE — Discharge Instructions (Signed)
 Food Agency Name: [facility] Address: [address], [place], [place] [address] Phone: [phone] Website: www.adtsrc.org Services Offered: Meals on [employer] and Meals with Friends.   Home care, at home assisted living, volunteer services, Center  for Active Retirement, transportation  Agency Name: [facility] Address: Sites vary. Must call first. Food Pantry location: [address], [place], [place] [address] [employer] Phone: [phone] Website: none Services Offered: Museum/gallery curator, utility assistance if funds available Careers adviser for all of [place], [employer], [employer], Office manager and [employer] for [employer] area only) Walk-in  current Id and current address verification required.  Wed-Thurs: 9:30-12:00 Agency Name: [facility] Address: [address] [place], [place] [address]  Phone: [phone] or [phone] Website: none Services Offered: Food assistance Agency Name: [employer] Address: [address], [place], [place] [address]  Phone: [phone] or [phone]  Website: none Services Offered: Serves 1 hot meal a day at 11:00 am Monday-Sunday and 5 pm  on the second and fourth Sunday of each month Agency Name: [facility] Department of Health and [facility]  Services/Social Services  Address: [phone], [place], [place] [address] Phone: [phone] Website: www.co.[email or web address].[email or web address]   [email or web address]://hunt-[email or web address] Services Offered: Sales executive, [facility] program Agency Name: [facility] Address: [address] [place], [place] [address] Phone: [phone] Website: www.rockinghamhope.com Services Offered: Food pantry Tuesday, Wednesday and Thursday 9am-11:30am  (need appointment) and health clinic (9:00am-11:00am)  Agency Name: [employer] [facility] Address: [address], [place] / [address] [place] Phone: [phone] [place] / [phone] [place] Website: OpinionTrades.tn NetworkAffair.co.za Services Offered: Civil Service fast streamer, food pantry, soup kitchen ([place]) emergency financial  assistance, thrift stores, showers & hygiene products ([place]),  Christmas Assistance, spiritual help   Rent/Utilities/Housing  Agency: [place]  Clinical biochemist Address: [address] [address] [place], [place] [address]  [address] [place], [place] [address]  Phone Number: [phone] or [phone] For the hearing-impaired - Dial 711 for Relay [place] Services Agency Name: [facility]. [facility] Address: [address], [place], [place] [address] Phone: [phone] Website: www.co.[place].[email or web address]  Services Offered: Temporary financial assistance, subsidized housing, and utility  assistance  Agency Name: [employer]  Address: [address] [place], [place] [address] Phone: [phone] ext. [phone] Email: Contact: info@newrha .org Website: FootballPromos.co.nz Services Offered: Subsidized apartment rent based on income.  Agency Name: [facility] Address: [facility], [address] [place], [place] Phone: [phone]  Website: www.ccmeden.org Services Offered: Museum/gallery curator, utility assistance Technical brewer for all of  [facility], [employer], [employer], [employer]  January 06, 2020 [address] for [place] area only), rent assistance.  Agency Name: [facility] Address: [address] [place], [place], [place] [address] / [address],  [place] Phone: [phone] [place] / [phone] [place] Website: OpinionTrades.tn NetworkAffair.co.za Services Offered: Civil Service fast streamer, food, showers, hygiene products utility payment  assistance, thrift shops, rental assistance, Support Groups Agency Name: [facility]  Address: [address] [place], [place] [address]  Phone: [phone] / [phone] Website: https://www.daymarkrecovery.org/ Services Offered: Support groups for Bipolar, substance abuse, anger  management, panic depression and anxiety. Mobile crisis unit,  outpatient therapy, substance abuse treatment. Agency Name: [facility]  Address: [address], [place], [place] [address]  Phone: [phone] Website: www.helpinc-centeragainstviolence.org Services Offered: Support groups for domestic violence or sexual assault, support  group for elderly women and domestic assault.

## 2023-11-14 NOTE — Progress Notes (Signed)
 PROGRESS NOTE    Rachel Gross  FMW:[id number] DOB: April 29, 1965 DOA: 11/13/2023 PCP: Shona Norleen PEDLAR, MD   Brief Narrative:  Rachel Gross is a 58 y.o. female with medical history significant of chronic respiratory failure with hypoxia (using 4 L nasal Canula supplementation at baseline), lung cancer, COPD, anxiety, depression with anxiety and history of alcohol and tobacco abuse; who presented to the hospital secondary to 4 days of worsening shortness of breath, dyspnea on exertion and increased expiratory wheezing.  Patient reports the use of home bronchodilator management has failed to improve symptoms.   Patient admitted for COPD exacerbation, chest x-ray shows no definite infiltrates. Patient showing some withdrawal symptoms  Assessment & Plan:   Principal Problem:   COPD with acute exacerbation (HCC) Active Problems:   Essential hypertension   Thrombocytopenia   Hepatic cirrhosis (HCC)   Positive colorectal cancer screening using Cologuard test   Adenocarcinoma of lung, stage 3, right (HCC)   Alcohol abuse   Alcohol withdrawal (HCC)    Assessment and Plan: 1-SOB/COPD exacerbation -most likely triggered by bronchitis/bronchiectasis -Chest x-ray 10/14 without frank infiltrates Elevated white count 13, lactic acid 2.1 noted. -continue IV antibiotics, steroids, nebulizer treatment, flutter valve and mucolytic. -follow clinical response  - Blood cultures negative x24 hours.   2-chronic resp failure with hypoxia History of lung cancer - Continue supplemental oxygen, patient is currently at baseline needed 4 L/min -continue treatment with bronchodilators as mentioned above -follow response   3-GERD -continue PPI BID   4-hx of alcohol/tobacco abuse -cessation counseling provided -CIWA protocol, thiamine and folic acid .  Appears to be withdrawing overnight -nicoderm ordered   5-mood disorder -continue depakote , risperdal  and trazodone    6-hypomagnesemia -electrolyte  repleted -follow trend   7-elevated MCV -will check B12 level -most likely associated with alcohol use    Advance Care Planning:   Code Status: Full Code    Consults: none    Family Communication: no family at bedside    Severity of Illness: The appropriate patient status for this patient is OBSERVATION. Observation status is judged to be reasonable and necessary in order to provide the required intensity of service to ensure the patient's safety. The patient's presenting symptoms, physical exam findings, and initial radiographic and laboratory data in the context of their medical condition is felt to place them at decreased risk for further clinical deterioration. Furthermore, it is anticipated that the patient will be medically stable for discharge from the hospital within 2 midnights of admission.     Subjective: Patient seen and examined at the bedside.  She was sleeping on my visit.  Arousable.  Denies shortness of breath.  On supplemental oxygen 4 L/min which is her home dose.  Appears to have some withdrawal overnight and is started on CIWA protocol/Ativan  Objective: Vitals:   11/14/23 0416 11/14/23 0808 11/14/23 0906 11/14/23 1322  BP: 111/78  113/70 117/75  Pulse: 82  (!) 102 86  Resp: 20  20 19   Temp: (!) 97.5 F (36.4 C)  97.8 F (36.6 C) 97.6 F (36.4 C)  TempSrc: Oral  Oral Oral  SpO2: 98% 97% 98% 98%  Weight:      Height:        Intake/Output Summary (Last 24 hours) at 11/14/2023 1407 Last data filed at 11/13/2023 1603 Gross per 24 hour  Intake 34.63 ml  Output --  Net 34.63 ml   Filed Weights   11/13/23 1154 11/13/23 1643  Weight: 64 kg 57.6 kg  Examination:  General exam: sleeping, arousable, follows commands Respiratory system: Bilateral decreased breath sounds at bases, no wheezes,  Cardiovascular system: S1 & S2 heard, Rate controlled Gastrointestinal system: Abdomen is nondistended, soft and nontender. Normal bowel sounds heard. Extremities:  No cyanosis, clubbing, edema  Central nervous system: Alert and oriented. No focal neurological deficits. Moving extremities Skin: No rashes, lesions or ulcers   Data Reviewed: I have personally reviewed following labs and imaging studies  CBC: Recent Labs  Lab 11/13/23 1218 11/14/23 0412  WBC 13.3* 6.9  HGB 14.7 13.5  HCT 43.7 40.8  MCV 107.9* 105.4*  PLT 166 148*   Basic Metabolic Panel: Recent Labs  Lab 11/13/23 1218 11/14/23 0412  NA 136 137  K 3.5 3.9  CL 95* 96*  CO2 26 31  GLUCOSE 97 150*  BUN <5* 6  CREATININE 0.43* 0.49  CALCIUM 9.1 9.1  MG 1.6* 2.1   GFR: Estimated Creatinine Clearance: 61.8 mL/min (by C-G formula based on SCr of 0.49 mg/dL). Liver Function Tests: No results for input(s): AST, ALT, ALKPHOS, BILITOT, PROT, ALBUMIN in the last 168 hours. No results for input(s): LIPASE, AMYLASE in the last 168 hours. No results for input(s): AMMONIA in the last 168 hours. Coagulation Profile: No results for input(s): INR, PROTIME in the last 168 hours. Cardiac Enzymes: No results for input(s): CKTOTAL, CKMB, CKMBINDEX, TROPONINI in the last 168 hours. BNP (last 3 results) Recent Labs    11/13/23 1218  PROBNP 78.1   HbA1C: No results for input(s): HGBA1C in the last 72 hours. CBG: No results for input(s): GLUCAP in the last 168 hours. Lipid Profile: No results for input(s): CHOL, HDL, LDLCALC, TRIG, CHOLHDL, LDLDIRECT in the last 72 hours. Thyroid  Function Tests: No results for input(s): TSH, T4TOTAL, FREET4, T3FREE, THYROIDAB in the last 72 hours. Anemia Panel: Recent Labs    11/13/23 1438  VITAMINB12 552   Sepsis Labs: Recent Labs  Lab 11/13/23 1225 11/13/23 1438  LATICACIDVEN 1.9 2.1*    Recent Results (from the past 240 hours)  Culture, blood (routine x 2)     Status: None (Preliminary result)   Collection Time: 11/13/23 12:18 PM   Specimen: BLOOD  Result Value Ref Range  Status   Specimen Description BLOOD PORT  Final   Special Requests   Final    BOTTLES DRAWN AEROBIC ONLY Blood Culture results may not be optimal due to an inadequate volume of blood received in culture bottles   Culture   Final    NO GROWTH < 24 HOURS Performed at [facility], [address] [place], [place] [address]    Report Status PENDING  Incomplete  Culture, blood (routine x 2)     Status: None (Preliminary result)   Collection Time: 11/13/23 12:25 PM   Specimen: BLOOD  Result Value Ref Range Status   Specimen Description BLOOD BLOOD RIGHT ARM  Final   Special Requests   Final    BOTTLES DRAWN AEROBIC AND ANAEROBIC Blood Culture adequate volume   Culture   Final    NO GROWTH < 24 HOURS Performed at [facility], [address], [place], [place] [address]    Report Status PENDING  Incomplete  Resp panel by RT-PCR (RSV, Flu A&B, Covid) Anterior Nasal Swab     Status: None   Collection Time: 11/13/23 12:48 PM   Specimen: Anterior Nasal Swab  Result Value Ref Range Status   SARS Coronavirus 2 by RT PCR NEGATIVE NEGATIVE Final    Comment: (  NOTE) SARS-CoV-2 target nucleic acids are NOT DETECTED.  The SARS-CoV-2 RNA is generally detectable in upper respiratory specimens during the acute phase of infection. The lowest concentration of SARS-CoV-2 viral copies this assay can detect is 138 copies/mL. A negative result does not preclude SARS-Cov-2 infection and should not be used as the sole basis for treatment or other patient management decisions. A negative result may occur with  improper specimen collection/handling, submission of specimen other than nasopharyngeal swab, presence of viral mutation(s) within the areas targeted by this assay, and inadequate number of viral copies(<138 copies/mL). A negative result must be combined with clinical observations, patient history, and epidemiological information. The expected result is Negative.  Fact Sheet for Patients:   BloggerCourse.com  Fact Sheet for Healthcare Providers:  SeriousBroker.it  This test is no t yet approved or cleared by the [place]  FDA and  has been authorized for detection and/or diagnosis of SARS-CoV-2 by FDA under an Emergency Use Authorization (EUA). This EUA will remain  in effect (meaning this test can be used) for the duration of the COVID-19 declaration under Section 564(b)(1) of the Act, 21 U.S.C.section 360bbb-3(b)(1), unless the authorization is terminated  or revoked sooner.       Influenza A by PCR NEGATIVE NEGATIVE Final   Influenza B by PCR NEGATIVE NEGATIVE Final    Comment: (NOTE) The Xpert Xpress SARS-CoV-2/FLU/RSV plus assay is intended as an aid in the diagnosis of influenza from Nasopharyngeal swab specimens and should not be used as a sole basis for treatment. Nasal washings and aspirates are unacceptable for Xpert Xpress SARS-CoV-2/FLU/RSV testing.  Fact Sheet for Patients: BloggerCourse.com  Fact Sheet for Healthcare Providers: SeriousBroker.it  This test is not yet approved or cleared by the [place]  FDA and has been authorized for detection and/or diagnosis of SARS-CoV-2 by FDA under an Emergency Use Authorization (EUA). This EUA will remain in effect (meaning this test can be used) for the duration of the COVID-19 declaration under Section 564(b)(1) of the Act, 21 U.S.C. section 360bbb-3(b)(1), unless the authorization is terminated or revoked.     Resp Syncytial Virus by PCR NEGATIVE NEGATIVE Final    Comment: (NOTE) Fact Sheet for Patients: BloggerCourse.com  Fact Sheet for Healthcare Providers: SeriousBroker.it  This test is not yet approved or cleared by the [place]  FDA and has been authorized for detection and/or diagnosis of SARS-CoV-2 by FDA under an Emergency Use  Authorization (EUA). This EUA will remain in effect (meaning this test can be used) for the duration of the COVID-19 declaration under Section 564(b)(1) of the Act, 21 U.S.C. section 360bbb-3(b)(1), unless the authorization is terminated or revoked.  Performed at [facility], [address] [place], [place] [address]          Radiology Studies: DG Chest Portable 1 View Result Date: 11/13/2023 CLINICAL DATA:  Shortness of breath for several days EXAM: PORTABLE CHEST 1 VIEW COMPARISON:  08/14/2023 CT FINDINGS: Left chest wall port is again noted and stable. The cardiac shadow is within normal limits. Aortic calcifications are seen. Linear scarring is noted in the bases bilaterally. Stable subpleural scarring is noted on the left. No sizable infiltrate or effusion is seen. No bony abnormality is noted. IMPRESSION: Chronic changes without acute abnormality. Electronically Signed   By: Oneil Devonshire M.D.   On: 11/13/2023 12:55        Scheduled Meds:  arformoterol  15 mcg Nebulization BID   budesonide (PULMICORT) nebulizer solution  0.5 mg Nebulization BID  Chlorhexidine  Gluconate Cloth  6 each Topical Daily   dextromethorphan-guaiFENesin   1 tablet Oral BID   divalproex   250 mg Oral QHS   enoxaparin  (LOVENOX ) injection  40 mg Subcutaneous Q24H   LORazepam   0-4 mg Oral Q6H   Followed by   NOREEN ON 11/15/2023] LORazepam   0-4 mg Oral Q12H   methylPREDNISolone  (SOLU-MEDROL ) injection  40 mg Intravenous Q12H   nicotine  21 mg Transdermal Daily   pantoprazole   40 mg Oral BID   risperiDONE   2 mg Oral QHS   sodium chloride  flush  10-40 mL Intracatheter Q12H   sodium chloride  flush  3 mL Intravenous Q12H   thiamine  100 mg Oral Daily   Or   thiamine  100 mg Intravenous Daily   traZODone  100 mg Oral QHS   Continuous Infusions:  sodium chloride      levofloxacin  (LEVAQUIN ) IV            Nana Vastine, MD [facility] 11/14/2023, 2:07 PM

## 2023-11-15 DIAGNOSIS — J441 Chronic obstructive pulmonary disease with (acute) exacerbation: Secondary | ICD-10-CM | POA: Diagnosis not present

## 2023-11-15 NOTE — Progress Notes (Signed)
 PROGRESS NOTE    Rachel Gross  FMW:[id number] DOB: [birth year] DOA: 11/13/2023 PCP: Shona Norleen PEDLAR, MD   Brief Narrative:  Rachel Gross is a 58 y.o. female with medical history significant of chronic respiratory failure with hypoxia (using 4 L nasal Canula supplementation at baseline), lung cancer, COPD, anxiety, depression with anxiety and history of alcohol and tobacco abuse; who presented to the hospital secondary to 4 days of worsening shortness of breath, dyspnea on exertion and increased expiratory wheezing.  Patient reports the use of home bronchodilator management has failed to improve symptoms.   Patient admitted for COPD exacerbation, chest x-ray shows no definite infiltrates. Patient showing some withdrawal symptoms and has been receiving Ativan .  Assessment & Plan:   Principal Problem:   COPD with acute exacerbation (HCC) Active Problems:   Essential hypertension   Thrombocytopenia   Hepatic cirrhosis (HCC)   Positive colorectal cancer screening using Cologuard test   Adenocarcinoma of lung, stage 3, right (HCC)   Alcohol abuse   Alcohol withdrawal (HCC)    Assessment and Plan: 1-SOB/COPD exacerbation -most likely triggered by bronchitis/bronchiectasis -Chest x-ray 10/14 without frank infiltrates Elevated white count 13, lactic acid 2.1 noted.  This has cleared. -continue IV antibiotics, steroids, nebulizer treatment, flutter valve and mucolytic. -follow clinical response  - Blood cultures negative x 48 hours.  Patient remains stable from a COPD standpoint.   2-chronic resp failure with hypoxia History of lung cancer - Continue supplemental oxygen, patient is currently at baseline needed 4 L/min -continue treatment with bronchodilators as mentioned above - Remains stable.   3-GERD -continue PPI BID   4-history of alcohol/tobacco abuse. Patient reports drinking a lot  of alcohol. Appears to be in withdrawal and receiving IV/p.o. Ativan  per CIWA  protocol. Will continue vitamin supplements.   Community resources provided for treatment   5-mood disorder -continue depakote , risperdal  and trazodone    6-hypomagnesemia -electrolyte repleted -follow trend   7-elevated MCV - Vitamin B12 level is within normal limit.-most likely associated with alcohol use    Advance Care Planning:   Code Status: Full Code    Consults: none    Family Communication: no family at bedside    Severity of Illness: The appropriate patient status for this patient is OBSERVATION. Observation status is judged to be reasonable and necessary in order to provide the required intensity of service to ensure the patient's safety. The patient's presenting symptoms, physical exam findings, and initial radiographic and laboratory data in the context of their medical condition is felt to place them at decreased risk for further clinical deterioration. Furthermore, it is anticipated that the patient will be medically stable for discharge from the hospital within 2 midnights of admission.     Subjective: Patient seen and examined at the bedside.  She appears somewhat shaky.  She did receive couple of doses of IV Ativan  yesterday and also this morning.  Vital signs are stable.  Borderline tachycardic.  Oxygen requiring 4 L/min which is her baseline.  Denies any chest pain or shortness of breath.  Objective: Vitals:   11/14/23 1322 11/14/23 2015 11/15/23 0315 11/15/23 0708  BP: 117/75 100/70 (!) 102/55   Pulse: 86 99 100   Resp: 19 20 18    Temp: 97.6 F (36.4 C) 97.9 F (36.6 C) 98 F (36.7 C)   TempSrc: Oral Oral Oral   SpO2: 98% 97% 100% 100%  Weight:      Height:        Intake/Output Summary (Last 24  hours) at 11/15/2023 1206 Last data filed at 11/14/2023 1700 Gross per 24 hour  Intake 386.39 ml  Output --  Net 386.39 ml   Filed Weights   11/13/23 1154 11/13/23 1643  Weight: 64 kg 57.6 kg    Examination:  General exam: sleeping, arousable,  follows commands Respiratory system: Bilateral decreased breath sounds at bases, no wheezes,  Cardiovascular system: S1 & S2 heard, tachycardic Gastrointestinal system: Abdomen is nondistended, soft and nontender. Normal bowel sounds heard. Extremities: Mild tremors Central nervous system: Alert and oriented. No focal neurological deficits. Moving extremities Skin: No rashes, lesions or ulcers   Data Reviewed: I have personally reviewed following labs and imaging studies  CBC: Recent Labs  Lab 11/13/23 1218 11/14/23 0412  WBC 13.3* 6.9  HGB 14.7 13.5  HCT 43.7 40.8  MCV 107.9* 105.4*  PLT 166 148*   Basic Metabolic Panel: Recent Labs  Lab 11/13/23 1218 11/14/23 0412  NA 136 137  K 3.5 3.9  CL 95* 96*  CO2 26 31  GLUCOSE 97 150*  BUN <5* 6  CREATININE 0.43* 0.49  CALCIUM 9.1 9.1  MG 1.6* 2.1   GFR: Estimated Creatinine Clearance: 61.8 mL/min (by C-G formula based on SCr of 0.49 mg/dL). Liver Function Tests: No results for input(s): AST, ALT, ALKPHOS, BILITOT, PROT, ALBUMIN in the last 168 hours. No results for input(s): LIPASE, AMYLASE in the last 168 hours. No results for input(s): AMMONIA in the last 168 hours. Coagulation Profile: No results for input(s): INR, PROTIME in the last 168 hours. Cardiac Enzymes: No results for input(s): CKTOTAL, CKMB, CKMBINDEX, TROPONINI in the last 168 hours. BNP (last 3 results) Recent Labs    11/13/23 1218  PROBNP 78.1   HbA1C: No results for input(s): HGBA1C in the last 72 hours. CBG: No results for input(s): GLUCAP in the last 168 hours. Lipid Profile: No results for input(s): CHOL, HDL, LDLCALC, TRIG, CHOLHDL, LDLDIRECT in the last 72 hours. Thyroid  Function Tests: No results for input(s): TSH, T4TOTAL, FREET4, T3FREE, THYROIDAB in the last 72 hours. Anemia Panel: Recent Labs    11/13/23 1438  VITAMINB12 552   Sepsis Labs: Recent Labs  Lab 11/13/23 1225  11/13/23 1438  LATICACIDVEN 1.9 2.1*    Recent Results (from the past 240 hours)  Culture, blood (routine x 2)     Status: None (Preliminary result)   Collection Time: 11/13/23 12:18 PM   Specimen: BLOOD  Result Value Ref Range Status   Specimen Description BLOOD PORT  Final   Special Requests   Final    BOTTLES DRAWN AEROBIC ONLY Blood Culture results may not be optimal due to an inadequate volume of blood received in culture bottles   Culture   Final    NO GROWTH 2 DAYS Performed at [facility], [address] [place], [place] [address]    Report Status PENDING  Incomplete  Culture, blood (routine x 2)     Status: None (Preliminary result)   Collection Time: 11/13/23 12:25 PM   Specimen: BLOOD  Result Value Ref Range Status   Specimen Description BLOOD BLOOD RIGHT ARM  Final   Special Requests   Final    BOTTLES DRAWN AEROBIC AND ANAEROBIC Blood Culture adequate volume   Culture   Final    NO GROWTH 2 DAYS Performed at [facility], [address] [place], [place] [address]    Report Status PENDING  Incomplete  Resp panel by RT-PCR (RSV, Flu A&B, Covid) Anterior Nasal Swab  Status: None   Collection Time: 11/13/23 12:48 PM   Specimen: Anterior Nasal Swab  Result Value Ref Range Status   SARS Coronavirus 2 by RT PCR NEGATIVE NEGATIVE Final    Comment: (NOTE) SARS-CoV-2 target nucleic acids are NOT DETECTED.  The SARS-CoV-2 RNA is generally detectable in upper respiratory specimens during the acute phase of infection. The lowest concentration of SARS-CoV-2 viral copies this assay can detect is 138 copies/mL. A negative result does not preclude SARS-Cov-2 infection and should not be used as the sole basis for treatment or other patient management decisions. A negative result may occur with  improper specimen collection/handling, submission of specimen other than nasopharyngeal swab, presence of viral mutation(s) within the areas targeted by this assay, and inadequate  number of viral copies(<138 copies/mL). A negative result must be combined with clinical observations, patient history, and epidemiological information. The expected result is Negative.  Fact Sheet for Patients:  BloggerCourse.com  Fact Sheet for Healthcare Providers:  SeriousBroker.it  This test is no t yet approved or cleared by the [place]  FDA and  has been authorized for detection and/or diagnosis of SARS-CoV-2 by FDA under an Emergency Use Authorization (EUA). This EUA will remain  in effect (meaning this test can be used) for the duration of the COVID-19 declaration under Section 564(b)(1) of the Act, 21 U.S.C.section 360bbb-3(b)(1), unless the authorization is terminated  or revoked sooner.       Influenza A by PCR NEGATIVE NEGATIVE Final   Influenza B by PCR NEGATIVE NEGATIVE Final    Comment: (NOTE) The Xpert Xpress SARS-CoV-2/FLU/RSV plus assay is intended as an aid in the diagnosis of influenza from Nasopharyngeal swab specimens and should not be used as a sole basis for treatment. Nasal washings and aspirates are unacceptable for Xpert Xpress SARS-CoV-2/FLU/RSV testing.  Fact Sheet for Patients: BloggerCourse.com  Fact Sheet for Healthcare Providers: SeriousBroker.it  This test is not yet approved or cleared by the [place]  FDA and has been authorized for detection and/or diagnosis of SARS-CoV-2 by FDA under an Emergency Use Authorization (EUA). This EUA will remain in effect (meaning this test can be used) for the duration of the COVID-19 declaration under Section 564(b)(1) of the Act, 21 U.S.C. section 360bbb-3(b)(1), unless the authorization is terminated or revoked.     Resp Syncytial Virus by PCR NEGATIVE NEGATIVE Final    Comment: (NOTE) Fact Sheet for Patients: BloggerCourse.com  Fact Sheet for Healthcare  Providers: SeriousBroker.it  This test is not yet approved or cleared by the [place]  FDA and has been authorized for detection and/or diagnosis of SARS-CoV-2 by FDA under an Emergency Use Authorization (EUA). This EUA will remain in effect (meaning this test can be used) for the duration of the COVID-19 declaration under Section 564(b)(1) of the Act, 21 U.S.C. section 360bbb-3(b)(1), unless the authorization is terminated or revoked.  Performed at [facility], [address] [place], [place] [address]          Radiology Studies: DG Chest Portable 1 View Result Date: 11/13/2023 CLINICAL DATA:  Shortness of breath for several days EXAM: PORTABLE CHEST 1 VIEW COMPARISON:  08/14/2023 CT FINDINGS: Left chest wall port is again noted and stable. The cardiac shadow is within normal limits. Aortic calcifications are seen. Linear scarring is noted in the bases bilaterally. Stable subpleural scarring is noted on the left. No sizable infiltrate or effusion is seen. No bony abnormality is noted. IMPRESSION: Chronic changes without acute abnormality. Electronically Signed   By: Oneil  Lukens M.D.   On: 11/13/2023 12:55        Scheduled Meds:  arformoterol  15 mcg Nebulization BID   budesonide (PULMICORT) nebulizer solution  0.5 mg Nebulization BID   Chlorhexidine  Gluconate Cloth  6 each Topical Daily   dextromethorphan-guaiFENesin   1 tablet Oral BID   divalproex   250 mg Oral QHS   enoxaparin  (LOVENOX ) injection  40 mg Subcutaneous Q24H   LORazepam   0-4 mg Oral Q6H   Followed by   LORazepam   0-4 mg Oral Q12H   methylPREDNISolone  (SOLU-MEDROL ) injection  40 mg Intravenous Q12H   nicotine  21 mg Transdermal Daily   pantoprazole   40 mg Oral BID   risperiDONE   2 mg Oral QHS   sodium chloride  flush  10-40 mL Intracatheter Q12H   sodium chloride  flush  3 mL Intravenous Q12H   thiamine  100 mg Oral Daily   Or   thiamine  100 mg Intravenous Daily   traZODone   100 mg Oral QHS   Continuous Infusions:  levofloxacin  (LEVAQUIN ) IV Stopped (11/14/23 1540)          Derryl Duval, MD [facility] 11/15/2023, 12:06 PM

## 2023-11-15 NOTE — Plan of Care (Signed)

## 2023-11-16 ENCOUNTER — Other Ambulatory Visit: Payer: Self-pay

## 2023-11-16 DIAGNOSIS — J441 Chronic obstructive pulmonary disease with (acute) exacerbation: Secondary | ICD-10-CM | POA: Diagnosis not present

## 2023-11-16 MED ORDER — POLYETHYLENE GLYCOL 3350 17 G PO PACK: 17.0000 g | PACK | Freq: Two times a day (BID) | ORAL | Status: DC

## 2023-11-16 MED ORDER — ENSURE PLUS HIGH PROTEIN PO LIQD
237.0000 mL | Freq: Two times a day (BID) | ORAL | Status: DC
  Administered 2023-11-16 – 2023-11-17 (×2): 237 mL via ORAL

## 2023-11-16 MED ORDER — ADULT MULTIVITAMIN W/MINERALS CH
1.0000 | ORAL_TABLET | Freq: Every day | ORAL | Status: DC
  Administered 2023-11-16 – 2023-11-17 (×2): 1 via ORAL
  Filled 2023-11-16 (×2): qty 1

## 2023-11-16 MED ORDER — BISACODYL 10 MG RE SUPP: 10.0000 mg | Freq: Once | RECTAL | Status: DC

## 2023-11-16 MED ORDER — LEVOFLOXACIN 750 MG PO TABS
750.0000 mg | ORAL_TABLET | Freq: Every day | ORAL | Status: DC
  Administered 2023-11-17: 750 mg via ORAL
  Filled 2023-11-16: qty 1

## 2023-11-16 MED ORDER — PREDNISONE 20 MG PO TABS
40.0000 mg | ORAL_TABLET | Freq: Every day | ORAL | Status: DC
  Administered 2023-11-17: 40 mg via ORAL
  Filled 2023-11-16: qty 2

## 2023-11-16 MED ORDER — POLYETHYLENE GLYCOL 3350 17 G PO PACK: 17.0000 g | PACK | Freq: Every day | ORAL | Status: DC | PRN

## 2023-11-16 NOTE — Progress Notes (Signed)
 11/16/2023 10:57 AM -----------------------------------------------------------[facility]--------------------------------------------------- D(Data) A(Action) R(response)     Data: Discharge Readiness Assessment EDD currently listed for tomorrow 11/17/2023    Action: Chart reviewed    Response: No immediate Barriers to discharge identified at this time pending clinical improvement and pt. progression.     Hyman Crossan, RN [employer] [facility]

## 2023-11-16 NOTE — Progress Notes (Signed)
 PROGRESS NOTE    Rachel Gross  FMW:[id number] DOB: Jun 15, 1965 DOA: 11/13/2023 PCP: Shona Norleen PEDLAR, MD   Brief Narrative:  Rachel Gross is a 58 y.o. female with medical history significant of chronic respiratory failure with hypoxia (using 4 L nasal Canula supplementation at baseline), lung cancer, COPD, anxiety, depression with anxiety and history of alcohol and tobacco abuse; who presented to the hospital secondary to 4 days of worsening shortness of breath, dyspnea on exertion and increased expiratory wheezing.  Patient reports the use of home bronchodilator management has failed to improve symptoms.   Patient admitted for COPD exacerbation, chest x-ray shows no definite infiltrates. Patient showing some withdrawal symptoms and has been receiving Ativan .  Assessment & Plan:   Principal Problem:   COPD with acute exacerbation (HCC) Active Problems:   Essential hypertension   Thrombocytopenia   Hepatic cirrhosis (HCC)   Positive colorectal cancer screening using Cologuard test   Adenocarcinoma of lung, stage 3, right (HCC)   Alcohol abuse   Alcohol withdrawal (HCC)    Assessment and Plan: 1-SOB/COPD exacerbation -most likely triggered by bronchitis/bronchiectasis -Chest x-ray 10/14 without frank infiltrates Elevated white count 13, lactic acid 2.1 noted.  This has cleared. -continue nebulizer treatment, flutter valve and mucolytic.  Will change IV Solu-Medrol  to p.o. prednisone  for next few days.  Change IV Levaquin  to p.o. -follow clinical response  - Blood cultures negative x 48 hours.  Patient remains stable from a COPD standpoint.   2-chronic resp failure with hypoxia History of lung cancer - Continue supplemental oxygen, patient is currently at baseline needed 4 L/min -continue treatment with bronchodilators as mentioned above - Remains stable.   3-GERD -continue PPI BID   4-history of alcohol/tobacco abuse. Patient reports drinking a lot  of alcohol. Appears  to be in withdrawal and receiving IV/p.o. Ativan  per CIWA protocol.  She has received 5 doses of Ativan  p.o. in the past 24 hours. Will continue vitamin supplements.   Community resources provided for treatment  Will increase activity/ambulation.  5-mood disorder -continue depakote , risperdal  and trazodone    6-hypomagnesemia -electrolyte repleted -follow trend   7-elevated MCV - Vitamin B12 level is within normal limit.-most likely associated with alcohol use  Will have PT and OT evaluation to assess mobility/strength and disposition planning    Advance Care Planning:   Code Status: Full Code    Consults: none    Family Communication: no family at bedside    Severity of Illness: The appropriate patient status for this patient is OBSERVATION. Observation status is judged to be reasonable and necessary in order to provide the required intensity of service to ensure the patient's safety. The patient's presenting symptoms, physical exam findings, and initial radiographic and laboratory data in the context of their medical condition is felt to place them at decreased risk for further clinical deterioration. Furthermore, it is anticipated that the patient will be medically stable for discharge from the hospital within 2 midnights of admission.     Subjective: Patient seen and examined at the bedside.  She was sleepy on my visit.  Arousable.  Received multiple doses of Ativan  in the past 24 hours.  No significant cough.  Not in any distress.  Respiratory wise stable. Objective: Vitals:   11/15/23 1835 11/15/23 1935 11/16/23 0431 11/16/23 0700  BP:  108/67 111/66 95/66  Pulse:  86 75 93  Resp:  20 18   Temp:  98.3 F (36.8 C) 98.7 F (37.1 C)   TempSrc:  Oral  SpO2: 99% 100% 99%   Weight:      Height:        Intake/Output Summary (Last 24 hours) at 11/16/2023 0904 Last data filed at 11/16/2023 0500 Gross per 24 hour  Intake 240 ml  Output --  Net 240 ml   Filed Weights    11/13/23 1154 11/13/23 1643  Weight: 64 kg 57.6 kg    Examination:  General exam: sleeping, arousable, follows commands Respiratory system: Bilateral decreased breath sounds at bases, no wheezes,  Cardiovascular system: S1 & S2 heard, tachycardic Gastrointestinal system: Abdomen is nondistended, soft and nontender. Normal bowel sounds heard. Extremities: Mild tremors Central nervous system: Alert and oriented. No focal neurological deficits. Moving extremities Skin: No rashes, lesions or ulcers   Data Reviewed: I have personally reviewed following labs and imaging studies  CBC: Recent Labs  Lab 11/13/23 1218 11/14/23 0412  WBC 13.3* 6.9  HGB 14.7 13.5  HCT 43.7 40.8  MCV 107.9* 105.4*  PLT 166 148*   Basic Metabolic Panel: Recent Labs  Lab 11/13/23 1218 11/14/23 0412  NA 136 137  K 3.5 3.9  CL 95* 96*  CO2 26 31  GLUCOSE 97 150*  BUN <5* 6  CREATININE 0.43* 0.49  CALCIUM 9.1 9.1  MG 1.6* 2.1   GFR: Estimated Creatinine Clearance: 61.8 mL/min (by C-G formula based on SCr of 0.49 mg/dL). Liver Function Tests: No results for input(s): AST, ALT, ALKPHOS, BILITOT, PROT, ALBUMIN in the last 168 hours. No results for input(s): LIPASE, AMYLASE in the last 168 hours. No results for input(s): AMMONIA in the last 168 hours. Coagulation Profile: No results for input(s): INR, PROTIME in the last 168 hours. Cardiac Enzymes: No results for input(s): CKTOTAL, CKMB, CKMBINDEX, TROPONINI in the last 168 hours. BNP (last 3 results) Recent Labs    11/13/23 1218  PROBNP 78.1   HbA1C: No results for input(s): HGBA1C in the last 72 hours. CBG: No results for input(s): GLUCAP in the last 168 hours. Lipid Profile: No results for input(s): CHOL, HDL, LDLCALC, TRIG, CHOLHDL, LDLDIRECT in the last 72 hours. Thyroid  Function Tests: No results for input(s): TSH, T4TOTAL, FREET4, T3FREE, THYROIDAB in the last 72  hours. Anemia Panel: Recent Labs    11/13/23 1438  VITAMINB12 552   Sepsis Labs: Recent Labs  Lab 11/13/23 1225 11/13/23 1438  LATICACIDVEN 1.9 2.1*    Recent Results (from the past 240 hours)  Culture, blood (routine x 2)     Status: None (Preliminary result)   Collection Time: 11/13/23 12:18 PM   Specimen: BLOOD  Result Value Ref Range Status   Specimen Description BLOOD PORT  Final   Special Requests   Final    BOTTLES DRAWN AEROBIC ONLY Blood Culture results may not be optimal due to an inadequate volume of blood received in culture bottles   Culture   Final    NO GROWTH 3 DAYS Performed at [facility], [address] [place], [place] [address]    Report Status PENDING  Incomplete  Culture, blood (routine x 2)     Status: None (Preliminary result)   Collection Time: 11/13/23 12:25 PM   Specimen: BLOOD  Result Value Ref Range Status   Specimen Description BLOOD BLOOD RIGHT ARM  Final   Special Requests   Final    BOTTLES DRAWN AEROBIC AND ANAEROBIC Blood Culture adequate volume   Culture   Final    NO GROWTH 3 DAYS Performed at [facility], [address] [place],  [place] [address]    Report Status PENDING  Incomplete  Resp panel by RT-PCR (RSV, Flu A&B, Covid) Anterior Nasal Swab     Status: None   Collection Time: 11/13/23 12:48 PM   Specimen: Anterior Nasal Swab  Result Value Ref Range Status   SARS Coronavirus 2 by RT PCR NEGATIVE NEGATIVE Final    Comment: (NOTE) SARS-CoV-2 target nucleic acids are NOT DETECTED.  The SARS-CoV-2 RNA is generally detectable in upper respiratory specimens during the acute phase of infection. The lowest concentration of SARS-CoV-2 viral copies this assay can detect is 138 copies/mL. A negative result does not preclude SARS-Cov-2 infection and should not be used as the sole basis for treatment or other patient management decisions. A negative result may occur with  improper specimen collection/handling, submission of  specimen other than nasopharyngeal swab, presence of viral mutation(s) within the areas targeted by this assay, and inadequate number of viral copies(<138 copies/mL). A negative result must be combined with clinical observations, patient history, and epidemiological information. The expected result is Negative.  Fact Sheet for Patients:  BloggerCourse.com  Fact Sheet for Healthcare Providers:  SeriousBroker.it  This test is no t yet approved or cleared by the [place]  FDA and  has been authorized for detection and/or diagnosis of SARS-CoV-2 by FDA under an Emergency Use Authorization (EUA). This EUA will remain  in effect (meaning this test can be used) for the duration of the COVID-19 declaration under Section 564(b)(1) of the Act, 21 U.S.C.section 360bbb-3(b)(1), unless the authorization is terminated  or revoked sooner.       Influenza A by PCR NEGATIVE NEGATIVE Final   Influenza B by PCR NEGATIVE NEGATIVE Final    Comment: (NOTE) The Xpert Xpress SARS-CoV-2/FLU/RSV plus assay is intended as an aid in the diagnosis of influenza from Nasopharyngeal swab specimens and should not be used as a sole basis for treatment. Nasal washings and aspirates are unacceptable for Xpert Xpress SARS-CoV-2/FLU/RSV testing.  Fact Sheet for Patients: BloggerCourse.com  Fact Sheet for Healthcare Providers: SeriousBroker.it  This test is not yet approved or cleared by the [place]  FDA and has been authorized for detection and/or diagnosis of SARS-CoV-2 by FDA under an Emergency Use Authorization (EUA). This EUA will remain in effect (meaning this test can be used) for the duration of the COVID-19 declaration under Section 564(b)(1) of the Act, 21 U.S.C. section 360bbb-3(b)(1), unless the authorization is terminated or revoked.     Resp Syncytial Virus by PCR NEGATIVE NEGATIVE Final     Comment: (NOTE) Fact Sheet for Patients: BloggerCourse.com  Fact Sheet for Healthcare Providers: SeriousBroker.it  This test is not yet approved or cleared by the [place]  FDA and has been authorized for detection and/or diagnosis of SARS-CoV-2 by FDA under an Emergency Use Authorization (EUA). This EUA will remain in effect (meaning this test can be used) for the duration of the COVID-19 declaration under Section 564(b)(1) of the Act, 21 U.S.C. section 360bbb-3(b)(1), unless the authorization is terminated or revoked.  Performed at [facility], [address], [place], [place] [address]          Radiology Studies: No results found.       Scheduled Meds:  arformoterol  15 mcg Nebulization BID   budesonide (PULMICORT) nebulizer solution  0.5 mg Nebulization BID   Chlorhexidine  Gluconate Cloth  6 each Topical Daily   dextromethorphan-guaiFENesin   1 tablet Oral BID   divalproex   250 mg Oral QHS   enoxaparin  (LOVENOX ) injection  40  mg Subcutaneous Q24H   LORazepam   0-4 mg Oral Q12H   nicotine  21 mg Transdermal Daily   pantoprazole   40 mg Oral BID   [START ON 11/17/2023] predniSONE   40 mg Oral Q breakfast   risperiDONE   2 mg Oral QHS   sodium chloride  flush  10-40 mL Intracatheter Q12H   sodium chloride  flush  3 mL Intravenous Q12H   thiamine  100 mg Oral Daily   Or   thiamine  100 mg Intravenous Daily   traZODone  100 mg Oral QHS   Continuous Infusions:  levofloxacin  (LEVAQUIN ) IV Stopped (11/15/23 1545)          Derryl Duval, MD [facility] 11/16/2023, 9:04 AM

## 2023-11-16 NOTE — Evaluation (Signed)
 Physical Therapy Evaluation Patient Details Name: Rachel Gross MRN: [id number] DOB: 1965/07/21 Today's Date: 11/16/2023  History of Present Illness  Rachel Gross is a 58 y.o. female with medical history significant of chronic respiratory failure with hypoxia (using 4 L nasal Canula supplementation at baseline), lung cancer, COPD, anxiety, depression with anxiety and history of alcohol and tobacco abuse; who presented to the hospital secondary to 4 days of worsening shortness of breath, dyspnea on exertion and increased expiratory wheezing.  Patient reports the use of home bronchodilator management has failed to improve symptoms.     There has not been any fevers, but patient expressed chills, nonproductive coughing spells and denies hemoptysis, melena, hematochezia, abdominal pain, chest pain, nausea/vomiting, sick contacts, focal weaknesses, dysuria or any other complaints.   Clinical Impression  Patient agreeable to Rachel Gross evaluation. Rachel Gross lethargic throughout session. Friend of patient present throughout session and contributes to subjective history. Patient reports at baseline she is independent with mobility and ADL/iADLs. This date, she is modified independent with bed mobility and functional transfers (including toilet transfer), independent with pericare in sitting, but requires CGA during ambulation without AD due to unsteadiness on feet. Rachel Gross reports current gait/ambulation is not her baseline. Rachel Gross demonstrates general weakness and is limited due to fatigue. Would recommend RW for Rachel Gross once discharged home for maximum safety. Patient returns to bed at end of session, call button within reach. Patient will benefit from continued skilled physical therapy acutely and in recommended venue in order to address current deficits.        If plan is discharge home, recommend the following: A little help with walking and/or transfers;Help with stairs or ramp for entrance   Can travel by private vehicle    Yes    Equipment Recommendations Rolling walker (2 wheels)  Recommendations for Other Services       Functional Status Assessment Patient has had a recent decline in their functional status and demonstrates the ability to make significant improvements in function in a reasonable and predictable amount of time.     Precautions / Restrictions Precautions Precautions: Fall Recall of Precautions/Restrictions: Intact Restrictions Weight Bearing Restrictions Per Provider Order: No      Mobility  Bed Mobility Overal bed mobility: Modified Independent             General bed mobility comments: Rachel Gross gets to EOB when Rachel Gross steps outside room, HOB flattened    Transfers Overall transfer level: Needs assistance Equipment used: None Transfers: Sit to/from Stand Sit to Stand: Contact guard assist           General transfer comment: CGA due to impaired balance once standing    Ambulation/Gait Ambulation/Gait assistance: Contact guard assist, Min assist Gait Distance (Feet): 40 Feet Assistive device: None Gait Pattern/deviations: Step-through pattern, Staggering right, Staggering left, Decreased step length - right, Decreased step length - left, Decreased stride length, Shuffle Gait velocity: Dec     General Gait Details: Rachel Gross unsteady when ambulating without AD, using railings in hallway for steadying at times, CGA for safety, Rachel Gross reporting this as not being her baseline.  Stairs            Wheelchair Mobility     Tilt Bed    Modified Rankin (Stroke Patients Only)       Balance Overall balance assessment: Needs assistance Sitting-balance support: Feet supported, No upper extremity supported Sitting balance-Leahy Scale: Good Sitting balance - Comments: Seated EOB   Standing balance support: During functional activity, No upper extremity  supported Standing balance-Leahy Scale: Fair Standing balance comment: w/o RW                              Pertinent Vitals/Pain Pain Assessment Pain Assessment: 0-10 Pain Score: 7  Pain Location: LBP Pain Intervention(s): Limited activity within patient's tolerance, Repositioned    Home Living Family/patient expects to be discharged to:: Private residence Living Arrangements: Non-relatives/Friends Available Help at Discharge: Friend(s);Available PRN/intermittently Type of Home: House Home Access: Stairs to enter   Entrance Stairs-Number of Steps: 15-20 Alternate Level Stairs-Number of Steps: 15-20 Home Layout: Multi-level Home Equipment: None Additional Comments: Rachel Gross reports she is staying with a couple. Her living area is on upper level. has 15-20 STE on outside to enter her    Prior Function Prior Level of Function : Independent/Modified Independent;Driving             Mobility Comments: Reports as community ambulator without AD ADLs Comments: independent with ADL/iADLs     Extremity/Trunk Assessment   Upper Extremity Assessment Upper Extremity Assessment: Defer to OT evaluation (Shoulder flexion ROM WFL. MMT 4-/5)    Lower Extremity Assessment Lower Extremity Assessment: Generalized weakness;Overall WFL for tasks assessed (hip flexion MMT 3+/5 bilat, ankle DF MMT 4-/5)    Cervical / Trunk Assessment Cervical / Trunk Assessment: Kyphotic  Communication   Communication Communication: No apparent difficulties    Cognition Arousal: Lethargic Behavior During Therapy: WFL for tasks assessed/performed                             Following commands: Intact       Cueing Cueing Techniques: Verbal cues, Tactile cues, Visual cues     General Comments      Exercises     Assessment/Plan    Rachel Gross Assessment Patient needs continued Rachel Gross services;All further Rachel Gross needs can be met in the next venue of care  Rachel Gross Problem List Decreased strength;Decreased activity tolerance;Decreased balance;Pain       Rachel Gross Treatment Interventions DME instruction;Gait  training;Stair training;Functional mobility training;Therapeutic activities;Therapeutic exercise;Balance training;Patient/family education    Rachel Gross Goals (Current goals can be found in the Care Plan section)  Acute Rehab Rachel Gross Goals Patient Stated Goal: Return home Rachel Gross Goal Formulation: With patient Time For Goal Achievement: 11/19/23 Potential to Achieve Goals: Good    Frequency Min 3X/week     Co-evaluation               AM-PAC Rachel Gross 6 Clicks Mobility  Outcome Measure Help needed turning from your back to your side while in a flat bed without using bedrails?: None Help needed moving from lying on your back to sitting on the side of a flat bed without using bedrails?: None Help needed moving to and from a bed to a chair (including a wheelchair)?: A Little Help needed standing up from a chair using your arms (e.g., wheelchair or bedside chair)?: A Little Help needed to walk in hospital room?: A Little Help needed climbing 3-5 steps with a railing? : A Little 6 Click Score: 20    End of Session Equipment Utilized During Treatment: Gait belt Activity Tolerance: Patient tolerated treatment well;Patient limited by fatigue Patient left: in bed;with call bell/phone within reach;with family/visitor present   Rachel Gross Visit Diagnosis: Unsteadiness on feet (R26.81);Pain;Muscle weakness (generalized) (M62.81) Pain - part of body:  (back)    Time: 9054-8989 Rachel Gross Time Calculation (min) (ACUTE ONLY): 25  min   Charges:   Rachel Gross Evaluation $Rachel Gross Eval Moderate Complexity: 1 Mod   Rachel Gross General Charges $$ ACUTE Rachel Gross VISIT: 1 Visit         2:06 PM, 11/16/23 Clete Kuch Powell-Butler, Rachel Gross, DPT [place] with [facility]

## 2023-11-16 NOTE — TOC Progression Note (Signed)
 Transition of Care [name]) - Progression Note    Patient Details  Name: Rachel Gross MRN: [id number] Date of Birth: 1965-12-03  [name]) CM/SW Contact  Hoy DELENA Bigness, LCSW Phone Number: 11/16/2023, 2:21 PM  Clinical Narrative:    RW ordered through [employer] to be drop shipped to pt's house. Pt declines HH f/u and declines to have OPPT referral made.     Barriers to Discharge: Continued Medical Work up               Expected Discharge Plan and Services                         DME Arranged: Vannie rolling DME Agency: [employer] Date DME Agency Contacted: 11/16/23 Time DME Agency Contacted: 1420 Representative spoke with at DME Agency: Darlyn             Social Drivers of Health (SDOH) Interventions SDOH Screenings   Food Insecurity: Food Insecurity Present (11/13/2023)  Housing: High Risk (11/13/2023)  Transportation Needs: No Transportation Needs (11/13/2023)  Utilities: Not At Risk (11/13/2023)  Alcohol Screen: Medium Risk (11/16/2023)  Depression (PHQ2-9): Low Risk  (09/13/2023)  Recent Concern: Depression (PHQ2-9) - Medium Risk (08/02/2023)  Financial Resource Strain: Medium Risk (12/10/2019)  Physical Activity: Sufficiently Active (12/10/2019)  Social Connections: Socially Isolated (12/10/2019)  Stress: Stress Concern Present (12/10/2019)  Tobacco Use: High Risk (11/13/2023)    Readmission Risk Interventions    11/15/2023    9:49 AM  Readmission Risk Prevention Plan  Transportation Screening Complete  Home Care Screening Complete  Medication Review (RN CM) Complete

## 2023-11-16 NOTE — Plan of Care (Signed)
  Problem: Acute Rehab PT Goals(only PT should resolve) Goal: Patient Will Transfer Sit To/From Stand Outcome: Progressing Flowsheets (Taken 11/16/2023 1410) Patient will transfer sit to/from stand:  with modified independence  with supervision Goal: Pt Will Transfer Bed To Chair/Chair To Bed Outcome: Progressing Flowsheets (Taken 11/16/2023 1410) Pt will Transfer Bed to Chair/Chair to Bed:  with modified independence  with supervision Goal: Pt Will Ambulate Outcome: Progressing Flowsheets (Taken 11/16/2023 1410) Pt will Ambulate:  75 feet  with least restrictive assistive device  with supervision Goal: Pt Will Go Up/Down Stairs Outcome: Progressing Flowsheets (Taken 11/16/2023 1410) Pt will Go Up / Down Stairs:  Flight  with rail(s)  with contact guard assist    2:11 PM, 11/16/23 Daveda Larock Powell-Butler, PT, DPT [place] with [facility]

## 2023-11-16 NOTE — Progress Notes (Signed)
 Initial Nutrition Assessment  DOCUMENTATION CODES:   Non-severe (moderate) malnutrition in context of social or environmental circumstances  INTERVENTION:   Ensure Plus High Protein po BID, each supplement provides 350 kcal and 20 grams of protein Magic cup TID with meals, each supplement provides 290 kcal and 9 grams of protein MVI with minerals daily  NUTRITION DIAGNOSIS:   Moderate Malnutrition related to social / environmental circumstances (ETOH abuse) as evidenced by mild muscle depletion, mild fat depletion, percent weight loss (9% weight loss x 3 months).  GOAL:   Patient will meet greater than or equal to 90% of their needs  MONITOR:   PO intake, Supplement acceptance  REASON FOR ASSESSMENT:   Malnutrition Screening Tool    ASSESSMENT:   58 yo female admitted with COPD exacerbation. PMH includes alcohol and tobacco abuse, lung cancer, COPD on 4 L oxygen at baseline, anxiety, alcoholic hepatitis, hepatitis C, GERD, Bipolar disorder.  Patient states that PTA, she ate one meal per day, usually at the soup kitchen. She lives with 2 friends and one of them does the food shopping and cooking. She drinks 6 24 ounce beers and half of a fifth of liquor every day at home. She likes Ensure supplements and drinks them at home when she has them. She plans to move into her own apartment next month.   Currently on a heart healthy diet. Meal intakes: 50-75%.  Labs reviewed.  Medications reviewed and include protonix , prednisone , thiamine, IV levofloxacin .  Patient meets criteria for moderate malnutrition, given mild depletion of muscle and subcutaneous fat mass and 9% weight loss within the past 3 months.  NUTRITION - FOCUSED PHYSICAL EXAM:  Flowsheet Row Most Recent Value  Orbital Region Mild depletion  Upper Arm Region Mild depletion  Thoracic and Lumbar Region Moderate depletion  Buccal Region No depletion  Temple Region Mild depletion  Clavicle Bone Region Mild  depletion  Clavicle and Acromion Bone Region Mild depletion  Scapular Bone Region No depletion  Dorsal Hand Mild depletion  Patellar Region No depletion  Anterior Thigh Region Mild depletion  Posterior Calf Region Mild depletion  Edema (RD Assessment) Mild  Hair Reviewed  Eyes Reviewed  Mouth Reviewed  Skin Reviewed  Nails Reviewed    Diet Order:   Diet Order             Diet Heart Room service appropriate? Yes; Fluid consistency: Thin  Diet effective now                   EDUCATION NEEDS:   Not appropriate for education at this time  Skin:  Skin Assessment: Reviewed RN Assessment  Last BM:  10/14  Height:   Ht Readings from Last 1 Encounters:  11/13/23 5' 1 (1.549 m)    Weight:   Wt Readings from Last 1 Encounters:  11/13/23 57.6 kg    Ideal Body Weight:  47.7 kg  BMI:  Body mass index is 23.99 kg/m.  Estimated Nutritional Needs:   Kcal:  1600-1800  Protein:  75-85 gm  Fluid:  1.6-1.8 L   Suzen HUNT RD, LDN, CNSC Contact via secure chat. If unavailable, use group chat RD Inpatient.

## 2023-11-17 DIAGNOSIS — J441 Chronic obstructive pulmonary disease with (acute) exacerbation: Secondary | ICD-10-CM | POA: Diagnosis not present

## 2023-11-17 MED ORDER — PREDNISONE 20 MG PO TABS: 40.0000 mg | ORAL_TABLET | Freq: Every day | ORAL | 0 refills | Status: AC

## 2023-11-17 MED ORDER — LEVOFLOXACIN 750 MG PO TABS: 750.0000 mg | ORAL_TABLET | Freq: Every day | ORAL | 0 refills | Status: AC

## 2023-11-17 MED ORDER — ALBUTEROL SULFATE HFA 108 (90 BASE) MCG/ACT IN AERS: 2.0000 | INHALATION_SPRAY | Freq: Four times a day (QID) | RESPIRATORY_TRACT | 2 refills | Status: AC | PRN

## 2023-11-17 NOTE — Plan of Care (Signed)
   Problem: Education: Goal: Knowledge of General Education information will improve Description: Including pain rating scale, medication(s)/side effects and non-pharmacologic comfort measures Outcome: Progressing   Problem: Clinical Measurements: Goal: Will remain free from infection Outcome: Progressing   Problem: Activity: Goal: Risk for activity intolerance will decrease Outcome: Progressing

## 2023-11-17 NOTE — Progress Notes (Signed)
 Patient unable to stay awake, will wake up for a few minutes then go right back to sleep. This Clinical research associate and Radio producer at bedside CIWA scale 0 due to patient sleeping and not staying awake. Noted patient has been unsteady to feet during earlier part of shift. MD Sigdel made aware. MD seen patient, patient become more awake. Patient given scheduled PO medications, CIWA rechecked patient scoring a 2 at the time. At 1335 patient stated she is discharging home. Noted no discharge order yet, patient reported she did not have an oxygen tank to go home with. TOC made aware. Patient later started getting ready. Informed patient several times she needed oxygen to go home with and that there was no discharge summary yet. Patient irritable dur to ride not answering and having to wait on discharge summary and oxygen tank. MD Sigdel, CN and TOC made aware. CN Brandie RN informed this Clinical research associate that patient could take the hospital tank and bring it back. Document signed to bring oxygen tank back.

## 2023-11-17 NOTE — TOC Transition Note (Signed)
 Transition of Care [facility]) - Discharge Note   Patient Details  Name: Rachel Gross MRN: [id number] Date of Birth: 10-02-1965  Transition of Care [facility]) CM/SW Contact:  Lucie Lunger, LCSWA Phone Number: 11/17/2023, 2:07 PM   Clinical Narrative:    CSW updated that pt will need O2 to get home as she does not have any tanks here to get home with. CSW notes per chart review that pt has home O2 through [employer]. CSW spoke to weekend [employer] rep who states they will get O2 tank delivered to hospital for pt to D/C home with, no ETA provided. CSW updated MD and RN. TOC signing off.     Barriers to Discharge: Continued Medical Work up   Patient Goals and CMS Choice            Discharge Placement                       Discharge Plan and Services Additional resources added to the After Visit Summary for                  DME Arranged: Walker rolling DME Agency: [employer] Date DME Agency Contacted: 11/16/23 Time DME Agency Contacted: 1420 Representative spoke with at DME Agency: Darlyn            Social Drivers of Health (SDOH) Interventions SDOH Screenings   Food Insecurity: Food Insecurity Present (11/13/2023)  Housing: High Risk (11/13/2023)  Transportation Needs: No Transportation Needs (11/13/2023)  Utilities: Not At Risk (11/13/2023)  Alcohol Screen: Medium Risk (11/16/2023)  Depression (PHQ2-9): Low Risk  (09/13/2023)  Recent Concern: Depression (PHQ2-9) - Medium Risk (08/02/2023)  Financial Resource Strain: Medium Risk (12/10/2019)  Physical Activity: Sufficiently Active (12/10/2019)  Social Connections: Socially Isolated (12/10/2019)  Stress: Stress Concern Present (12/10/2019)  Tobacco Use: High Risk (11/13/2023)     Readmission Risk Interventions    11/15/2023    9:49 AM  Readmission Risk Prevention Plan  Transportation Screening Complete  Home Care Screening Complete  Medication Review (RN CM) Complete

## 2023-11-17 NOTE — Discharge Summary (Signed)
 Physician Discharge Summary  Rachel Gross FMW:[id number] DOB: Mar 21, 1965 DOA: 11/13/2023  PCP: Shona Norleen PEDLAR, MD  Admit date: 11/13/2023 Discharge date: 11/17/2023  Admitted From: Home Disposition: Home  Recommendations for Outpatient Follow-up:  Follow up with PCP in 1 week with repeat CBC/BMP Follow up in ED if symptoms worsen or new appear   Home Health: No Equipment/Devices: None  Discharge Condition: Stable CODE STATUS: Full Diet recommendation: Heart healthy  Brief/Interim Summary:  Brief Narrative:  Rachel Gross is a 58 y.o. female with medical history significant of chronic respiratory failure with hypoxia (using 4 L nasal Canula supplementation at baseline), lung cancer, COPD, anxiety, depression with anxiety and history of alcohol and tobacco abuse; who presented to the hospital secondary to 4 days of worsening shortness of breath, dyspnea on exertion and increased expiratory wheezing.  Will treat for COPD exacerbation.  Chest x-ray revealed no definite infiltrates.  Hospital course notable for alcohol withdrawal symptoms requiring IV/p.o. Ativan     Assessment and Plan: 1-SOB/COPD exacerbation -most likely triggered by bronchitis/bronchiectasis - Chest x-ray on admission revealed no frank infiltrates.  Lactic acid was mildly elevated .  She received IV Levaquin  later transition to p.o. and will require 2 more days after discharge.  Received bronchodilator therapy, supportive care.  Overall hospital course remained uncomplicated from a COPD standpoint.  She remains on 4 L/min of supplemental oxygen which is her baseline.   2-chronic resp failure with hypoxia History of lung cancer - Remains on 4 L/min supplemental oxygen.   3-GERD -continue PPI BID   4-history of alcohol/tobacco abuse. Patient reports drinking a lot  of alcohol. Hospital course notable for significant withdrawal symptoms for which she required IV/p.o. Ativan  per CIWA protocol.  Also received  multivitamins.   She was provided with resources regarding alcohol treatment.  She has declined home health care.  5-mood disorder -continue depakote , risperdal  and trazodone    6-hypomagnesemia -electrolyte repleted -follow trend   7-elevated MCV - Vitamin B12 level is within normal limit.-most likely associated with alcohol use   Patient discharged home.  Discharge Diagnoses:  Principal Problem:   COPD with acute exacerbation (HCC) Active Problems:   Essential hypertension   Thrombocytopenia   Hepatic cirrhosis (HCC)   Positive colorectal cancer screening using Cologuard test   Adenocarcinoma of lung, stage 3, right (HCC)   Alcohol abuse   Alcohol withdrawal (HCC)    Discharge Instructions  Discharge Instructions     Diet - low sodium heart healthy   Complete by: As directed    Discharge instructions   Complete by: As directed    1.  Complete steroids and antibiotics as prescribed.  Follow-up with PCP in 1 week. 2.  Return to the ER if recurrence of symptoms.   Increase activity slowly   Complete by: As directed       Allergies as of 11/17/2023       Reactions   Folic Acid  Anaphylaxis   Not entirely clear if it is from folic acid . She is taking folic acid  now and doesn't have the throat swelling anymore   Penicillins Anaphylaxis   Throat swelled Swelling of the face/tongue/throat, SOB, or low BP Childhood allergy   Amoxicillin Other (See Comments)   Hallucinations  July 2019   Carboplatin  Nausea Only, Other (See Comments), Cough   Patient complaints of feeling hot and flushed. See progress note from 7/11. Patient able to complete infusion after additional medications given. Fingers itch, nausea Second reaction on 08/30/2022; included itching of  hands and nausea; see progress note from 08/30/2022   Other Swelling   Patient states that the sunrise brand folic acid  made her feel like her throat was swelling.    Fish Allergy Nausea Only        Medication  List     TAKE these medications    albuterol  108 (90 Base) MCG/ACT inhaler Commonly known as: VENTOLIN  HFA Inhale 2 puffs into the lungs every 6 (six) hours as needed for wheezing or shortness of breath.   Breztri Aerosphere 160-9-4.8 MCG/ACT Aero inhaler Generic drug: budesonide-glycopyrrolate -formoterol  Inhale 2 puffs into the lungs 2 (two) times daily.   diphenoxylate -atropine  2.5-0.025 MG tablet Commonly known as: LOMOTIL  Take 2 tablets by mouth 4 (four) times daily as needed for diarrhea or loose stools.   divalproex  250 MG DR tablet Commonly known as: DEPAKOTE  Take 250 mg by mouth at bedtime.   HYDROcodone -acetaminophen  5-325 MG tablet Commonly known as: NORCO/VICODIN Take 1 tablet by mouth 2 (two) times daily as needed for moderate pain (pain score 4-6).   levofloxacin  750 MG tablet Commonly known as: LEVAQUIN  Take 1 tablet (750 mg total) by mouth daily. Start taking on: November 18, 2023   pantoprazole  40 MG tablet Commonly known as: PROTONIX  TAKE 1 TABLET BY MOUTH DAILY BEFORE BREAKFAST   predniSONE  20 MG tablet Commonly known as: DELTASONE  Take 2 tablets (40 mg total) by mouth daily with breakfast. Start taking on: November 18, 2023   risperiDONE  2 MG tablet Commonly known as: RISPERDAL  Take 2 mg by mouth at bedtime.   traZODone 100 MG tablet Commonly known as: DESYREL Take 100 mg by mouth at bedtime.               Durable Medical Equipment  (From admission, onward)           Start     Ordered   11/16/23 1407  For home use only DME Walker rolling  Once       Comments: Pt demonstrates unsteadiness during transfers and ambulation without an AD, putting pt at increased risk of falling. This is not patient's baseline. Pt would benefit from AD for maximum safety once discharged.   2:08 PM, 11/16/23 Rosaria Settler, PT, DPT [place] [facility]  Question Answer Comment  Walker: With 5 Inch Wheels   Patient needs a walker to  treat with the following condition Balance problem      11/16/23 1408            Follow-up Information     Shona Norleen PEDLAR, MD. Schedule an appointment as soon as possible for a visit in 1 week(s).   Specialty: Internal Medicine Contact information: [address] [place] [address] [phone]                Allergies  Allergen Reactions   Folic Acid  Anaphylaxis    Not entirely clear if it is from folic acid . She is taking folic acid  now and doesn't have the throat swelling anymore   Penicillins Anaphylaxis    Throat swelled Swelling of the face/tongue/throat, SOB, or low BP Childhood allergy   Amoxicillin Other (See Comments)    Hallucinations  July 2019    Carboplatin  Nausea Only, Other (See Comments) and Cough    Patient complaints of feeling hot and flushed. See progress note from 7/11. Patient able to complete infusion after additional medications given.  Fingers itch, nausea Second reaction on 08/30/2022; included itching of hands and nausea; see  progress note from 08/30/2022   Other Swelling    Patient states that the sunrise brand folic acid  made her feel like her throat was swelling.    Fish Allergy Nausea Only    Consultations:    Procedures/Studies: DG Chest Portable 1 View Result Date: 11/13/2023 CLINICAL DATA:  Shortness of breath for several days EXAM: PORTABLE CHEST 1 VIEW COMPARISON:  08/14/2023 CT FINDINGS: Left chest wall port is again noted and stable. The cardiac shadow is within normal limits. Aortic calcifications are seen. Linear scarring is noted in the bases bilaterally. Stable subpleural scarring is noted on the left. No sizable infiltrate or effusion is seen. No bony abnormality is noted. IMPRESSION: Chronic changes without acute abnormality. Electronically Signed   By: Oneil Devonshire M.D.   On: 11/13/2023 12:55      Subjective:   Discharge Exam: Vitals:   11/17/23 1159 11/17/23 1258  BP: 112/73 99/67  Pulse: 87 88   Resp: 18 16  Temp:  97.8 F (36.6 C)  SpO2: 100% 99%    General: Pt is alert, awake, not in acute distress Cardiovascular: rate controlled, S1/S2 + Respiratory: bilateral decreased breath sounds at bases Abdominal: Soft, NT, ND, bowel sounds + Extremities: no edema, no cyanosis    The results of significant diagnostics from this hospitalization (including imaging, microbiology, ancillary and laboratory) are listed below for reference.     Microbiology: Recent Results (from the past 240 hours)  Culture, blood (routine x 2)     Status: None (Preliminary result)   Collection Time: 11/13/23 12:18 PM   Specimen: BLOOD  Result Value Ref Range Status   Specimen Description BLOOD PORT  Final   Special Requests   Final    BOTTLES DRAWN AEROBIC ONLY Blood Culture results may not be optimal due to an inadequate volume of blood received in culture bottles   Culture   Final    NO GROWTH 4 DAYS Performed at [facility], [address], [place], [place] [address]    Report Status PENDING  Incomplete  Culture, blood (routine x 2)     Status: None (Preliminary result)   Collection Time: 11/13/23 12:25 PM   Specimen: BLOOD  Result Value Ref Range Status   Specimen Description BLOOD BLOOD RIGHT ARM  Final   Special Requests   Final    BOTTLES DRAWN AEROBIC AND ANAEROBIC Blood Culture adequate volume   Culture   Final    NO GROWTH 4 DAYS Performed at [facility], [address] [place], [place] [address]    Report Status PENDING  Incomplete  Resp panel by RT-PCR (RSV, Flu A&B, Covid) Anterior Nasal Swab     Status: None   Collection Time: 11/13/23 12:48 PM   Specimen: Anterior Nasal Swab  Result Value Ref Range Status   SARS Coronavirus 2 by RT PCR NEGATIVE NEGATIVE Final    Comment: (NOTE) SARS-CoV-2 target nucleic acids are NOT DETECTED.  The SARS-CoV-2 RNA is generally detectable in upper respiratory specimens during the acute phase of infection. The lowest concentration of  SARS-CoV-2 viral copies this assay can detect is 138 copies/mL. A negative result does not preclude SARS-Cov-2 infection and should not be used as the sole basis for treatment or other patient management decisions. A negative result may occur with  improper specimen collection/handling, submission of specimen other than nasopharyngeal swab, presence of viral mutation(s) within the areas targeted by this assay, and inadequate number of viral copies(<138 copies/mL). A negative result must be combined  with clinical observations, patient history, and epidemiological information. The expected result is Negative.  Fact Sheet for Patients:  BloggerCourse.com  Fact Sheet for Healthcare Providers:  SeriousBroker.it  This test is no t yet approved or cleared by the [place]  FDA and  has been authorized for detection and/or diagnosis of SARS-CoV-2 by FDA under an Emergency Use Authorization (EUA). This EUA will remain  in effect (meaning this test can be used) for the duration of the COVID-19 declaration under Section 564(b)(1) of the Act, 21 U.S.C.section 360bbb-3(b)(1), unless the authorization is terminated  or revoked sooner.       Influenza A by PCR NEGATIVE NEGATIVE Final   Influenza B by PCR NEGATIVE NEGATIVE Final    Comment: (NOTE) The Xpert Xpress SARS-CoV-2/FLU/RSV plus assay is intended as an aid in the diagnosis of influenza from Nasopharyngeal swab specimens and should not be used as a sole basis for treatment. Nasal washings and aspirates are unacceptable for Xpert Xpress SARS-CoV-2/FLU/RSV testing.  Fact Sheet for Patients: BloggerCourse.com  Fact Sheet for Healthcare Providers: SeriousBroker.it  This test is not yet approved or cleared by the [place]  FDA and has been authorized for detection and/or diagnosis of SARS-CoV-2 by FDA under an Emergency Use  Authorization (EUA). This EUA will remain in effect (meaning this test can be used) for the duration of the COVID-19 declaration under Section 564(b)(1) of the Act, 21 U.S.C. section 360bbb-3(b)(1), unless the authorization is terminated or revoked.     Resp Syncytial Virus by PCR NEGATIVE NEGATIVE Final    Comment: (NOTE) Fact Sheet for Patients: BloggerCourse.com  Fact Sheet for Healthcare Providers: SeriousBroker.it  This test is not yet approved or cleared by the [place]  FDA and has been authorized for detection and/or diagnosis of SARS-CoV-2 by FDA under an Emergency Use Authorization (EUA). This EUA will remain in effect (meaning this test can be used) for the duration of the COVID-19 declaration under Section 564(b)(1) of the Act, 21 U.S.C. section 360bbb-3(b)(1), unless the authorization is terminated or revoked.  Performed at [facility], [address], [place], [place] [address]      Labs: BNP (last 3 results) No results for input(s): BNP in the last 8760 hours. Basic Metabolic Panel: Recent Labs  Lab 11/13/23 1218 11/14/23 0412  NA 136 137  K 3.5 3.9  CL 95* 96*  CO2 26 31  GLUCOSE 97 150*  BUN <5* 6  CREATININE 0.43* 0.49  CALCIUM 9.1 9.1  MG 1.6* 2.1   Liver Function Tests: No results for input(s): AST, ALT, ALKPHOS, BILITOT, PROT, ALBUMIN in the last 168 hours. No results for input(s): LIPASE, AMYLASE in the last 168 hours. No results for input(s): AMMONIA in the last 168 hours. CBC: Recent Labs  Lab 11/13/23 1218 11/14/23 0412  WBC 13.3* 6.9  HGB 14.7 13.5  HCT 43.7 40.8  MCV 107.9* 105.4*  PLT 166 148*   Cardiac Enzymes: No results for input(s): CKTOTAL, CKMB, CKMBINDEX, TROPONINI in the last 168 hours. BNP: Invalid input(s): POCBNP CBG: No results for input(s): GLUCAP in the last 168 hours. D-Dimer No results for input(s): DDIMER in the last 72  hours. Hgb A1c No results for input(s): HGBA1C in the last 72 hours. Lipid Profile No results for input(s): CHOL, HDL, LDLCALC, TRIG, CHOLHDL, LDLDIRECT in the last 72 hours. Thyroid  function studies No results for input(s): TSH, T4TOTAL, T3FREE, THYROIDAB in the last 72 hours.  Invalid input(s): FREET3 Anemia work up No results for input(s): VITAMINB12, FOLATE, FERRITIN,  TIBC, IRON, RETICCTPCT in the last 72 hours. Urinalysis    Component Value Date/Time   COLORURINE YELLOW 04/12/2023 1004   APPEARANCEUR HAZY (A) 04/12/2023 1004   LABSPEC 1.013 04/12/2023 1004   PHURINE 5.0 04/12/2023 1004   GLUCOSEU NEGATIVE 04/12/2023 1004   HGBUR SMALL (A) 04/12/2023 1004   BILIRUBINUR NEGATIVE 04/12/2023 1004   KETONESUR NEGATIVE 04/12/2023 1004   PROTEINUR NEGATIVE 04/12/2023 1004   NITRITE POSITIVE (A) 04/12/2023 1004   LEUKOCYTESUR MODERATE (A) 04/12/2023 1004   Sepsis Labs Recent Labs  Lab 11/13/23 1218 11/14/23 0412  WBC 13.3* 6.9   Microbiology Recent Results (from the past 240 hours)  Culture, blood (routine x 2)     Status: None (Preliminary result)   Collection Time: 11/13/23 12:18 PM   Specimen: BLOOD  Result Value Ref Range Status   Specimen Description BLOOD PORT  Final   Special Requests   Final    BOTTLES DRAWN AEROBIC ONLY Blood Culture results may not be optimal due to an inadequate volume of blood received in culture bottles   Culture   Final    NO GROWTH 4 DAYS Performed at [facility], [address], [place], [place] [address]    Report Status PENDING  Incomplete  Culture, blood (routine x 2)     Status: None (Preliminary result)   Collection Time: 11/13/23 12:25 PM   Specimen: BLOOD  Result Value Ref Range Status   Specimen Description BLOOD BLOOD RIGHT ARM  Final   Special Requests   Final    BOTTLES DRAWN AEROBIC AND ANAEROBIC Blood Culture adequate volume   Culture   Final    NO GROWTH 4 DAYS Performed at [facility], [address], [place], [place] [address]    Report Status PENDING  Incomplete  Resp panel by RT-PCR (RSV, Flu A&B, Covid) Anterior Nasal Swab     Status: None   Collection Time: 11/13/23 12:48 PM   Specimen: Anterior Nasal Swab  Result Value Ref Range Status   SARS Coronavirus 2 by RT PCR NEGATIVE NEGATIVE Final    Comment: (NOTE) SARS-CoV-2 target nucleic acids are NOT DETECTED.  The SARS-CoV-2 RNA is generally detectable in upper respiratory specimens during the acute phase of infection. The lowest concentration of SARS-CoV-2 viral copies this assay can detect is 138 copies/mL. A negative result does not preclude SARS-Cov-2 infection and should not be used as the sole basis for treatment or other patient management decisions. A negative result may occur with  improper specimen collection/handling, submission of specimen other than nasopharyngeal swab, presence of viral mutation(s) within the areas targeted by this assay, and inadequate number of viral copies(<138 copies/mL). A negative result must be combined with clinical observations, patient history, and epidemiological information. The expected result is Negative.  Fact Sheet for Patients:  BloggerCourse.com  Fact Sheet for Healthcare Providers:  SeriousBroker.it  This test is no t yet approved or cleared by the [place]  FDA and  has been authorized for detection and/or diagnosis of SARS-CoV-2 by FDA under an Emergency Use Authorization (EUA). This EUA will remain  in effect (meaning this test can be used) for the duration of the COVID-19 declaration under Section 564(b)(1) of the Act, 21 U.S.C.section 360bbb-3(b)(1), unless the authorization is terminated  or revoked sooner.       Influenza A by PCR NEGATIVE NEGATIVE Final   Influenza B by PCR NEGATIVE NEGATIVE Final    Comment: (NOTE) The Xpert Xpress SARS-CoV-2/FLU/RSV plus assay is intended as an  aid in the diagnosis of influenza from Nasopharyngeal swab specimens and should not be used as a sole basis for treatment. Nasal washings and aspirates are unacceptable for Xpert Xpress SARS-CoV-2/FLU/RSV testing.  Fact Sheet for Patients: BloggerCourse.com  Fact Sheet for Healthcare Providers: SeriousBroker.it  This test is not yet approved or cleared by the [place]  FDA and has been authorized for detection and/or diagnosis of SARS-CoV-2 by FDA under an Emergency Use Authorization (EUA). This EUA will remain in effect (meaning this test can be used) for the duration of the COVID-19 declaration under Section 564(b)(1) of the Act, 21 U.S.C. section 360bbb-3(b)(1), unless the authorization is terminated or revoked.     Resp Syncytial Virus by PCR NEGATIVE NEGATIVE Final    Comment: (NOTE) Fact Sheet for Patients: BloggerCourse.com  Fact Sheet for Healthcare Providers: SeriousBroker.it  This test is not yet approved or cleared by the [place]  FDA and has been authorized for detection and/or diagnosis of SARS-CoV-2 by FDA under an Emergency Use Authorization (EUA). This EUA will remain in effect (meaning this test can be used) for the duration of the COVID-19 declaration under Section 564(b)(1) of the Act, 21 U.S.C. section 360bbb-3(b)(1), unless the authorization is terminated or revoked.  Performed at [facility], [address] [place], [place] [address]      Time coordinating discharge: 35 minutes  SIGNED:   Derryl Duval, MD  [facility] 11/17/2023, 3:16 PM

## 2023-11-17 NOTE — Progress Notes (Signed)
 PROGRESS NOTE    Rachel Gross  FMW:[id number] DOB: 1965/08/18 DOA: 11/13/2023 PCP: Shona Norleen PEDLAR, MD   Brief Narrative:  Rachel Gross is a 58 y.o. female with medical history significant of chronic respiratory failure with hypoxia (using 4 L nasal Canula supplementation at baseline), lung cancer, COPD, anxiety, depression with anxiety and history of alcohol and tobacco abuse; who presented to the hospital secondary to 4 days of worsening shortness of breath, dyspnea on exertion and increased expiratory wheezing.  Patient reports the use of home bronchodilator management has failed to improve symptoms.   Patient admitted for COPD exacerbation, chest x-ray shows no definite infiltrates. Patient showing some withdrawal symptoms and has been receiving Ativan .  Assessment & Plan:   Principal Problem:   COPD with acute exacerbation (HCC) Active Problems:   Essential hypertension   Thrombocytopenia   Hepatic cirrhosis (HCC)   Positive colorectal cancer screening using Cologuard test   Adenocarcinoma of lung, stage 3, right (HCC)   Alcohol abuse   Alcohol withdrawal (HCC)    Assessment and Plan: 1-SOB/COPD exacerbation -most likely triggered by bronchitis/bronchiectasis -Chest x-ray 10/14 without frank infiltrates Elevated white count 13, lactic acid 2.1 noted.  This has cleared. -continue nebulizer treatment, flutter valve and mucolytic.  Continue oral prednisone  and Levaquin  for few days. -follow clinical response  - Blood cultures negative x 48 hours.  Patient remains stable from a COPD standpoint.   2-chronic resp failure with hypoxia History of lung cancer - Continue supplemental oxygen, patient is currently at baseline needed 4 L/min -continue treatment with bronchodilators as mentioned above - Remains stable. -Patient reports having no portable oxygen at home.  Will notify case management to assist with this.   3-GERD -continue PPI BID   4-history of alcohol/tobacco  abuse. Patient reports drinking a lot  of alcohol. Appears to be in withdrawal and receiving IV/p.o. Ativan  per [facility]WA protocol.  She has received 5 doses of Ativan  p.o. in the past 24 hours. Will continue vitamin supplements.   Community resources provided for treatment  Will increase activity/ambulation.  5-mood disorder -continue depakote , risperdal  and trazodone    6-hypomagnesemia -electrolyte repleted -follow trend   7-elevated MCV - Vitamin B12 level is within normal limit.-most likely associated with alcohol use  Will have PT and OT evaluation to assess mobility/strength and disposition planning    Advance Care Planning:   Code Status: Full Code    Consults: none    Family Communication: no family at bedside    Severity of Illness: The appropriate patient status for this patient is OBSERVATION. Observation status is judged to be reasonable and necessary in order to provide the required intensity of service to ensure the patient's safety. The patient's presenting symptoms, physical exam findings, and initial radiographic and laboratory data in the context of their medical condition is felt to place them at decreased risk for further clinical deterioration. Furthermore, it is anticipated that the patient will be medically stable for discharge from the hospital within 2 midnights of admission.     Subjective: Patient seen and examined at the bedside.  She was sleepy on my visit.  She was able to get up.  Apparently has been receiving Ativan .  She does get tremors which likely could be secondary to nebulizer therapy.  Does not appear to be actively withdrawing now.  Patient could potentially discharge from the hospital however she is slightly unsteady.  She also says she does not have any oxygen at home.  Objective: Vitals:  11/17/23 0808 11/17/23 0936 11/17/23 1159 11/17/23 1258  BP:  (!) 104/54 112/73 99/67  Pulse:  97 87 88  Resp:  16 18 16   Temp:  (!) 97.5 F (36.4 C)   97.8 F (36.6 C)  TempSrc:  Oral  Oral  SpO2: 99% 99% 100% 99%  Weight:      Height:        Intake/Output Summary (Last 24 hours) at 11/17/2023 1403 Last data filed at 11/16/2023 2235 Gross per 24 hour  Intake 10 ml  Output --  Net 10 ml   Filed Weights   11/13/23 1154 11/13/23 1643  Weight: 64 kg 57.6 kg    Examination:  General exam: sleeping, arousable, follows commands Respiratory system: Bilateral decreased breath sounds at bases, no wheezes,  Cardiovascular system: S1 & S2 heard, tachycardic Gastrointestinal system: Abdomen is nondistended, soft and nontender. Normal bowel sounds heard. Extremities: Mild tremors Central nervous system: Alert and oriented. No focal neurological deficits. Moving extremities Skin: No rashes, lesions or ulcers   Data Reviewed: I have personally reviewed following labs and imaging studies  CBC: Recent Labs  Lab 11/13/23 1218 11/14/23 0412  WBC 13.3* 6.9  HGB 14.7 13.5  HCT 43.7 40.8  MCV 107.9* 105.4*  PLT 166 148*   Basic Metabolic Panel: Recent Labs  Lab 11/13/23 1218 11/14/23 0412  NA 136 137  K 3.5 3.9  CL 95* 96*  CO2 26 31  GLUCOSE 97 150*  BUN <5* 6  CREATININE 0.43* 0.49  CALCIUM 9.1 9.1  MG 1.6* 2.1   GFR: Estimated Creatinine Clearance: 61.8 mL/min (by C-G formula based on SCr of 0.49 mg/dL). Liver Function Tests: No results for input(s): AST, ALT, ALKPHOS, BILITOT, PROT, ALBUMIN in the last 168 hours. No results for input(s): LIPASE, AMYLASE in the last 168 hours. No results for input(s): AMMONIA in the last 168 hours. Coagulation Profile: No results for input(s): INR, PROTIME in the last 168 hours. Cardiac Enzymes: No results for input(s): CKTOTAL, CKMB, CKMBINDEX, TROPONINI in the last 168 hours. BNP (last 3 results) Recent Labs    11/13/23 1218  PROBNP 78.1   HbA1C: No results for input(s): HGBA1C in the last 72 hours. CBG: No results for input(s): GLUCAP  in the last 168 hours. Lipid Profile: No results for input(s): CHOL, HDL, LDLCALC, TRIG, CHOLHDL, LDLDIRECT in the last 72 hours. Thyroid  Function Tests: No results for input(s): TSH, T4TOTAL, FREET4, T3FREE, THYROIDAB in the last 72 hours. Anemia Panel: No results for input(s): VITAMINB12, FOLATE, FERRITIN, TIBC, IRON, RETICCTPCT in the last 72 hours.  Sepsis Labs: Recent Labs  Lab 11/13/23 1225 11/13/23 1438  LATICACIDVEN 1.9 2.1*    Recent Results (from the past 240 hours)  Culture, blood (routine x 2)     Status: None (Preliminary result)   Collection Time: 11/13/23 12:18 PM   Specimen: BLOOD  Result Value Ref Range Status   Specimen Description BLOOD PORT  Final   Special Requests   Final    BOTTLES DRAWN AEROBIC ONLY Blood Culture results may not be optimal due to an inadequate volume of blood received in culture bottles   Culture   Final    NO GROWTH 4 DAYS Performed at [facility], [address], [place], [place] [address]    Report Status PENDING  Incomplete  Culture, blood (routine x 2)     Status: None (Preliminary result)   Collection Time: 11/13/23 12:25 PM   Specimen: BLOOD  Result Value Ref Range Status  Specimen Description BLOOD BLOOD RIGHT ARM  Final   Special Requests   Final    BOTTLES DRAWN AEROBIC AND ANAEROBIC Blood Culture adequate volume   Culture   Final    NO GROWTH 4 DAYS Performed at [facility], [address], [place], [place] [address]    Report Status PENDING  Incomplete  Resp panel by RT-PCR (RSV, Flu A&B, Covid) Anterior Nasal Swab     Status: None   Collection Time: 11/13/23 12:48 PM   Specimen: Anterior Nasal Swab  Result Value Ref Range Status   SARS Coronavirus 2 by RT PCR NEGATIVE NEGATIVE Final    Comment: (NOTE) SARS-CoV-2 target nucleic acids are NOT DETECTED.  The SARS-CoV-2 RNA is generally detectable in upper respiratory specimens during the acute phase of infection. The  lowest concentration of SARS-CoV-2 viral copies this assay can detect is 138 copies/mL. A negative result does not preclude SARS-Cov-2 infection and should not be used as the sole basis for treatment or other patient management decisions. A negative result may occur with  improper specimen collection/handling, submission of specimen other than nasopharyngeal swab, presence of viral mutation(s) within the areas targeted by this assay, and inadequate number of viral copies(<138 copies/mL). A negative result must be combined with clinical observations, patient history, and epidemiological information. The expected result is Negative.  Fact Sheet for Patients:  BloggerCourse.com  Fact Sheet for Healthcare Providers:  SeriousBroker.it  This test is no t yet approved or cleared by the [place]  FDA and  has been authorized for detection and/or diagnosis of SARS-CoV-2 by FDA under an Emergency Use Authorization (EUA). This EUA will remain  in effect (meaning this test can be used) for the duration of the COVID-19 declaration under Section 564(b)(1) of the Act, 21 U.S.C.section 360bbb-3(b)(1), unless the authorization is terminated  or revoked sooner.       Influenza A by PCR NEGATIVE NEGATIVE Final   Influenza B by PCR NEGATIVE NEGATIVE Final    Comment: (NOTE) The Xpert Xpress SARS-CoV-2/FLU/RSV plus assay is intended as an aid in the diagnosis of influenza from Nasopharyngeal swab specimens and should not be used as a sole basis for treatment. Nasal washings and aspirates are unacceptable for Xpert Xpress SARS-CoV-2/FLU/RSV testing.  Fact Sheet for Patients: BloggerCourse.com  Fact Sheet for Healthcare Providers: SeriousBroker.it  This test is not yet approved or cleared by the [place]  FDA and has been authorized for detection and/or diagnosis of SARS-CoV-2 by FDA under  an Emergency Use Authorization (EUA). This EUA will remain in effect (meaning this test can be used) for the duration of the COVID-19 declaration under Section 564(b)(1) of the Act, 21 U.S.C. section 360bbb-3(b)(1), unless the authorization is terminated or revoked.     Resp Syncytial Virus by PCR NEGATIVE NEGATIVE Final    Comment: (NOTE) Fact Sheet for Patients: BloggerCourse.com  Fact Sheet for Healthcare Providers: SeriousBroker.it  This test is not yet approved or cleared by the [place]  FDA and has been authorized for detection and/or diagnosis of SARS-CoV-2 by FDA under an Emergency Use Authorization (EUA). This EUA will remain in effect (meaning this test can be used) for the duration of the COVID-19 declaration under Section 564(b)(1) of the Act, 21 U.S.C. section 360bbb-3(b)(1), unless the authorization is terminated or revoked.  Performed at [facility], [address], [place], [place] [address]          Radiology Studies: No results found.       Scheduled Meds:  arformoterol  15 mcg Nebulization BID   budesonide (PULMICORT) nebulizer solution  0.5 mg Nebulization BID   Chlorhexidine  Gluconate Cloth  6 each Topical Daily   dextromethorphan-guaiFENesin   1 tablet Oral BID   divalproex   250 mg Oral QHS   enoxaparin  (LOVENOX ) injection  40 mg Subcutaneous Q24H   feeding supplement  237 mL Oral BID BM   levofloxacin   750 mg Oral Daily   LORazepam   0-4 mg Oral Q12H   multivitamin with minerals  1 tablet Oral Daily   nicotine  21 mg Transdermal Daily   pantoprazole   40 mg Oral BID   predniSONE   40 mg Oral Q breakfast   risperiDONE   2 mg Oral QHS   sodium chloride  flush  10-40 mL Intracatheter Q12H   sodium chloride  flush  3 mL Intravenous Q12H   thiamine  100 mg Oral Daily   Or   thiamine  100 mg Intravenous Daily   traZODone  100 mg Oral QHS   Continuous Infusions:          Rachel Gamblin, MD [facility] 11/17/2023, 2:03 PM

## 2023-11-17 NOTE — Progress Notes (Addendum)
 Patient discharged home today, transported home by friend. Patient refused to wait on [employer] to bring portable oxygen tank for discharge. CN Brandie RN made aware. Patient signed document to bring back hospital oxygen tank. Discharge summary went over with patient, patient verbalized understanding. Belongings sent home with patient. Patients port deaccessed by Zebedee PEAK.

## 2023-11-18 LAB — CULTURE, BLOOD (ROUTINE X 2)
Culture: NO GROWTH
Culture: NO GROWTH
Special Requests: ADEQUATE

## 2023-11-29 NOTE — Progress Notes (Unsigned)
 Patient came into the clinic requesting flu shot. Given shot and blood pressure assessed

## 2024-02-04 ENCOUNTER — Other Ambulatory Visit: Payer: Self-pay

## 2024-02-19 ENCOUNTER — Inpatient Hospital Stay: Attending: Oncology

## 2024-02-19 VITALS — BP 113/72 | HR 99 | Temp 96.6°F | Resp 20

## 2024-02-19 DIAGNOSIS — Z95828 Presence of other vascular implants and grafts: Secondary | ICD-10-CM

## 2024-02-19 DIAGNOSIS — C7951 Secondary malignant neoplasm of bone: Secondary | ICD-10-CM | POA: Insufficient documentation

## 2024-02-19 DIAGNOSIS — C3431 Malignant neoplasm of lower lobe, right bronchus or lung: Secondary | ICD-10-CM | POA: Diagnosis present

## 2024-02-19 LAB — CBC WITH DIFFERENTIAL/PLATELET
Abs Immature Granulocytes: 0.02 K/uL (ref 0.00–0.07)
Basophils Absolute: 0 K/uL (ref 0.0–0.1)
Basophils Relative: 1 %
Eosinophils Absolute: 0.1 K/uL (ref 0.0–0.5)
Eosinophils Relative: 1 %
HCT: 42.8 % (ref 36.0–46.0)
Hemoglobin: 13.9 g/dL (ref 12.0–15.0)
Immature Granulocytes: 0 %
Lymphocytes Relative: 23 %
Lymphs Abs: 1.3 K/uL (ref 0.7–4.0)
MCH: 32.8 pg (ref 26.0–34.0)
MCHC: 32.5 g/dL (ref 30.0–36.0)
MCV: 100.9 fL — ABNORMAL HIGH (ref 80.0–100.0)
Monocytes Absolute: 0.7 K/uL (ref 0.1–1.0)
Monocytes Relative: 12 %
Neutro Abs: 3.7 K/uL (ref 1.7–7.7)
Neutrophils Relative %: 63 %
Platelets: 178 K/uL (ref 150–400)
RBC: 4.24 MIL/uL (ref 3.87–5.11)
RDW: 12.6 % (ref 11.5–15.5)
WBC: 5.7 K/uL (ref 4.0–10.5)
nRBC: 0 % (ref 0.0–0.2)

## 2024-02-19 LAB — COMPREHENSIVE METABOLIC PANEL WITH GFR
ALT: 8 U/L (ref 0–44)
AST: 27 U/L (ref 15–41)
Albumin: 4.1 g/dL (ref 3.5–5.0)
Alkaline Phosphatase: 70 U/L (ref 38–126)
Anion gap: 15 (ref 5–15)
BUN: 7 mg/dL (ref 6–20)
CO2: 25 mmol/L (ref 22–32)
Calcium: 9.5 mg/dL (ref 8.9–10.3)
Chloride: 98 mmol/L (ref 98–111)
Creatinine, Ser: 0.61 mg/dL (ref 0.44–1.00)
GFR, Estimated: >60 mL/min
Glucose, Bld: 108 mg/dL — ABNORMAL HIGH (ref 70–99)
Potassium: 4.1 mmol/L (ref 3.5–5.1)
Sodium: 138 mmol/L (ref 135–145)
Total Bilirubin: 0.5 mg/dL (ref 0.0–1.2)
Total Protein: 7.1 g/dL (ref 6.5–8.1)

## 2024-02-19 LAB — MAGNESIUM: Magnesium: 1.8 mg/dL (ref 1.7–2.4)

## 2024-02-19 NOTE — Progress Notes (Signed)
 Patients port flushed without difficulty.  Good blood return noted with no bruising or swelling noted at site.  Labs drawn.  VSS with discharge and left in satisfactory condition with no s/s of distress noted. All follow ups as scheduled.       Rachel Gross

## 2024-02-20 ENCOUNTER — Inpatient Hospital Stay: Admitting: Oncology

## 2024-02-20 ENCOUNTER — Other Ambulatory Visit: Payer: Self-pay

## 2024-03-04 ENCOUNTER — Other Ambulatory Visit: Payer: Self-pay

## 2024-03-04 ENCOUNTER — Inpatient Hospital Stay: Admitting: Oncology

## 2024-03-06 NOTE — Progress Notes (Incomplete)
 " Patient Care Team: Rachel Norleen PEDLAR, MD as PCP - General (Internal Medicine) Rachel Lamar HERO, MD as Consulting Physician (Gastroenterology)  Clinic Day:  03/07/2024  Referring physician: Shona Norleen PEDLAR, MD   CHIEF COMPLAINT:  CC: Non small cell lung carcinoma  Rachel Gross 59 y.o. female was transferred to my care after her prior physician has left.   ASSESSMENT & PLAN:   Assessment & Plan: Rachel Gross  is a 59 y.o. 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 Extensive oncology history below. Disease status undetermined due to lack of recent imaging.  Stable labs without anemia or organ dysfunction.  Prefers CT imaging over PET scan. Last CT scan in 07/2023 was stable   - Ordered chest CT to assess current disease status per her preference. - Plan to follow up after CT scan to discuss results and further management. - If she has recurrence of disease, will obtain Guardant 360 and can consider immunotherapy with caution.   Chronic obstructive pulmonary disease (COPD) with chronic respiratory failure on home oxygen COPD with chronic respiratory failure requiring home oxygen at 4 L/min. Reports chronic dyspnea, unchanged from baseline. Unable to use oxygen consistently outside home due to equipment and insurance issues. Uses inhalers as prescribed, continues to smoke and drink alcohol. Declined smoking cessation assistance.  - Advised use of home oxygen as much as possible. - Reinforced importance of consistent inhaler use to prevent exacerbations. - Offered supplemental oxygen during visit, which she declined. - Discussed smoking cessation; she declined assistance.     The patient understands the plans discussed today and is in agreement with them.  She knows to contact our office if she develops concerns prior to her next  appointment.  40 minutes of total time was spent for this patient encounter, including preparation,review of records,  face-to-face counseling with the patient and coordination of care, physical exam, and documentation of the encounter.    Rachel Gross,acting as a neurosurgeon for Rachel Dry, MD.,have documented all relevant documentation on the behalf of Rachel Dry, MD,as directed by  Rachel Dry, MD while in the presence of Rachel Dry, MD.  I, Rachel Dry MD, have reviewed the above documentation for accuracy and completeness, and I agree with the above.    Rachel Dry, MD  Rachel Gross [address] [place] [address] Dept: [phone] Dept Fax: [phone]   No orders of the defined types were placed in this encounter.    ONCOLOGY HISTORY:   I have reviewed her chart and materials related to her cancer extensively and collaborated history with the patient. 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 Imaging features highly suspicious for primary or metastatic neoplasm. 7 mm right middle lobe perifissural nodule has a triangular configuration and may well be a subpleural lymph node. The 6 mm left lower lobe pulmonary nodule has a more rounded configuration. Cirrhosis.  -05/13/2019: Initial PET: Enlarging hypermetabolic RIGHT lobe nodule is concerning for bronchogenic carcinoma. No hypermetabolic mediastinal lymph nodes. A 6 mm LEFT lobe  pulmonary nodule too small to characterize by FDG PET scan. -06/19/2019: Right lower lobectomy and lymph node excision.  -Pathology: Adenocarcinoma, moderately-differentiated, spanning 1.7 cm. Favor non-small cell  carcinoma. Tumor is limited to lung. Lymphovascular invasion present. Resection margins are negative. Two of fourteen lymph nodes positive for metastatic carcinoma (2/14). pT1c, pN2  -Foundation One: PD-L1 30%, K-ras G12A, MSI stable.  EGFR mutation not identified.  -09/09/2019-11/11/2019: 4 cycles of adjuvant carboplatin  and pemetrexed  completed -12/29/2021: CT chest: Interval enlargement of a cavitary nodule of the dependent right upper lobe consistent with an enlarging metastasis. 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 Numerous new clustered nodules of varying sizes, generally subsolid in appearance throughout the right upper lobe. Multiple additional bilateral pulmonary nodules are unchanged, possibly benign and incidental underlying sequelae of prior infection or inflammation. -01/19/2022: PET: The cavitary right middle lobe nodule of concern on recent CT is hypermetabolic for size and remains suspicious for bronchogenic carcinoma (metastatic disease versus new primary). 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 No other definite osseous metastases identified. However, there is new prominent nearly symmetric metabolic activity posteromedially within all of the ribs bilaterally which may be stress-related. No evidence of extra thoracic metastatic disease. -02/06/2022: Left rib biopsy.  Pathology: Metastatic moderate to poorly differentiated adenocarcinoma.  -03/01/2022:  NGS by [facility]: PD-L1 (22 C3)-TPS: 5%. TMB-high, 54mut/Mb. K-ras G12 A pathogenic variant. TP53 pathogenic variant. Other targetable mutations negative. MSI-stable.  -03/22/2022: XRT to left rib lesion completed -06/12/2022-06/16/2022: SBRT to T5 lesion.  -06/29/2022-current: Carboplatin , pemetrexed  and pembrolizumab , carboplatin  discontinued after 4 cycles on 08/30/2022, pembrolizumab  discontinued after 04/19/2023 secondary to  diarrhea -05/2022 - current: Imaging showing relatively stable to improved disease    Current Treatment:  Surveillance  INTERVAL HISTORY:   Discussed the use of AI scribe software for clinical note transcription with the patient, who gave verbal consent to proceed.  History of Present Illness Doloras Tellado is a 59 year old female with non-small cell lung cancer metastatic to bone who presents for oncology follow-up.  She reports a history of lung cancer involving the right lung, fifth rib, and spine. She underwent partial right lung resection and received radiation therapy to sites of bony involvement. She has chemo immunotherapy adjuvantly and immunotherapy was discontinued for diarrhea. She has not had interval imaging since July 2025. She reports no new or worsening symptoms related to her malignancy.  She experiences chronic pain in her back, feet, legs, and toes, which she attributes to her cancer and prior radiation therapy. She denies neuropathy and declines gabapentin, instead managing her pain with BC powders.  She has chronic dyspnea, which she attributes to her malignancy, prior lung resection, and underlying COPD. She is prescribed home oxygen at 4 L/min but is unable to use it consistently outside the home due to equipment limitations. She uses inhalers for COPD and reports no recent worsening of her dyspnea. She continues to smoke a few cigarettes daily and consumes approximately three beers per day, with no recent changes in these habits.    I have reviewed the past medical history, past surgical history, social history and family history with the patient and they are unchanged from previous note.  ALLERGIES:  is allergic to folic acid , penicillins, amoxicillin, carboplatin , other, and fish allergy.  MEDICATIONS:  Current Outpatient Medications  Medication Sig Dispense Refill   albuterol  (VENTOLIN  HFA) 108 (90 Base) MCG/ACT inhaler Inhale 2 puffs into the lungs every 6  (  six) hours as needed for wheezing or shortness of breath. 8 g 2   BREZTRI AEROSPHERE 160-9-4.8 MCG/ACT AERO inhaler Inhale 2 puffs into the lungs 2 (two) times daily.     diphenoxylate -atropine  (LOMOTIL ) 2.5-0.025 MG tablet Take 2 tablets by mouth 4 (four) times daily as needed for diarrhea or loose stools. 240 tablet 0   divalproex  (DEPAKOTE ) 250 MG DR tablet Take 250 mg by mouth at bedtime.      HYDROcodone -acetaminophen  (NORCO/VICODIN) 5-325 MG tablet Take 1 tablet by mouth 2 (two) times daily as needed for moderate pain (pain score 4-6). 60 tablet 0   levofloxacin  (LEVAQUIN ) 750 MG tablet Take 1 tablet (750 mg total) by mouth daily. 2 tablet 0   pantoprazole  (PROTONIX ) 40 MG tablet TAKE 1 TABLET BY MOUTH DAILY BEFORE BREAKFAST 90 tablet 1   predniSONE  (DELTASONE ) 20 MG tablet Take 2 tablets (40 mg total) by mouth daily with breakfast. 4 tablet 0   risperiDONE  (RISPERDAL ) 2 MG tablet Take 2 mg by mouth at bedtime.      traZODone  (DESYREL ) 100 MG tablet Take 100 mg by mouth at bedtime.     No current facility-administered medications for this visit.    VITALS:  Blood pressure 131/72, pulse (!) 120, temperature 98.1 F (36.7 C), temperature source Oral, resp. rate (!) 22, weight 142 lb 6.7 oz (64.6 kg), SpO2 97%.  Wt Readings from Last 3 Encounters:  03/07/24 142 lb 6.7 oz (64.6 kg)  11/13/23 126 lb 15.8 oz (57.6 kg)  08/23/23 140 lb 1.6 oz (63.5 kg)    Body mass index is 26.91 kg/m.  Performance status (ECOG): 2 - Symptomatic, <50% confined to bed  PHYSICAL EXAM:   GENERAL:alert, no distress and comfortable SKIN: skin color, texture, turgor are normal, no rashes or significant lesions EYES: normal, Conjunctiva are pink and non-injected, sclera clear LYMPH:  no palpable lymphadenopathy in the cervical, axillary or inguinal LUNGS: Diffuse rales and inspiratory wheezes present in all lung fields.  HEART: regular rate & rhythm and no murmurs and no lower extremity  edema ABDOMEN:abdomen soft, non-tender and normal bowel sounds Musculoskeletal:no cyanosis of digits and no clubbing  NEURO: alert & oriented x 3 with fluent speech  LABORATORY DATA:  I have reviewed the data as listed  Lab Results  Component Value Date   WBC 5.7 02/19/2024   NEUTROABS 3.7 02/19/2024   HGB 13.9 02/19/2024   HCT 42.8 02/19/2024   MCV 100.9 (H) 02/19/2024   PLT 178 02/19/2024      Chemistry      Component Value Date/Time   NA 138 02/19/2024 1038   K 4.1 02/19/2024 1038   CL 98 02/19/2024 1038   CO2 25 02/19/2024 1038   BUN 7 02/19/2024 1038   CREATININE 0.61 02/19/2024 1038   CREATININE 0.45 (L) 09/10/2017 1422      Component Value Date/Time   CALCIUM 9.5 02/19/2024 1038   ALKPHOS 70 02/19/2024 1038   AST 27 02/19/2024 1038   ALT 8 02/19/2024 1038   BILITOT 0.5 02/19/2024 1038       RADIOGRAPHIC STUDIES: I have personally reviewed the radiological images as listed and agreed with the findings in the report.  DG Chest Portable 1 View CLINICAL DATA:  Shortness of breath for several days  EXAM: PORTABLE CHEST 1 VIEW  COMPARISON:  08/14/2023 CT  FINDINGS: Left chest wall port is again noted and stable. The cardiac shadow is within normal limits. Aortic calcifications are seen. Linear scarring is noted  in the bases bilaterally. Stable subpleural scarring is noted on the left. No sizable infiltrate or effusion is seen. No bony abnormality is noted.  IMPRESSION: Chronic changes without acute abnormality.  Electronically Signed   By: Oneil Devonshire M.D.   On: 11/13/2023 12:55    "

## 2024-03-07 ENCOUNTER — Inpatient Hospital Stay: Admitting: Oncology

## 2024-03-07 VITALS — BP 131/72 | HR 120 | Temp 98.1°F | Resp 22 | Wt 142.4 lb

## 2024-03-07 DIAGNOSIS — J449 Chronic obstructive pulmonary disease, unspecified: Secondary | ICD-10-CM

## 2024-03-07 DIAGNOSIS — C3491 Malignant neoplasm of unspecified part of right bronchus or lung: Secondary | ICD-10-CM

## 2024-03-19 ENCOUNTER — Ambulatory Visit (HOSPITAL_COMMUNITY)

## 2024-03-20 ENCOUNTER — Other Ambulatory Visit (HOSPITAL_COMMUNITY)

## 2024-03-27 ENCOUNTER — Inpatient Hospital Stay: Admitting: Oncology
